# Patient Record
Sex: Male | Born: 1935
Health system: Southern US, Community
[De-identification: ages and names within clinical notes are randomized; demographics above are authoritative.]

## PROBLEM LIST (undated history)

## (undated) DIAGNOSIS — I10 Essential (primary) hypertension: Secondary | ICD-10-CM

## (undated) DIAGNOSIS — H269 Unspecified cataract: Secondary | ICD-10-CM

## (undated) DIAGNOSIS — Z950 Presence of cardiac pacemaker: Secondary | ICD-10-CM

## (undated) DIAGNOSIS — I4892 Unspecified atrial flutter: Secondary | ICD-10-CM

## (undated) DIAGNOSIS — G709 Myoneural disorder, unspecified: Secondary | ICD-10-CM

## (undated) DIAGNOSIS — E079 Disorder of thyroid, unspecified: Secondary | ICD-10-CM

## (undated) DIAGNOSIS — C801 Malignant (primary) neoplasm, unspecified: Secondary | ICD-10-CM

## (undated) HISTORY — DX: Myoneural disorder, unspecified: G70.9

## (undated) HISTORY — PX: APPENDECTOMY: SHX54

## (undated) HISTORY — PX: COLONOSCOPY: SHX174

## (undated) HISTORY — DX: Unspecified cataract: H26.9

## (undated) HISTORY — PX: PEG TUBE PLACEMENT: SUR1034

## (undated) HISTORY — PX: HERNIA REPAIR: SHX51

## (undated) HISTORY — PX: EYE SURGERY: SHX253

## (undated) HISTORY — DX: Unspecified atrial flutter: I48.92

## (undated) SURGERY — ESOPHAGOGASTRODUODENOSCOPY (EGD) WITH PROPOFOL
Anesthesia: Monitor Anesthesia Care

---

## 2008-02-13 DIAGNOSIS — Z950 Presence of cardiac pacemaker: Secondary | ICD-10-CM

## 2008-02-13 HISTORY — DX: Presence of cardiac pacemaker: Z95.0

## 2011-02-19 DIAGNOSIS — D518 Other vitamin B12 deficiency anemias: Secondary | ICD-10-CM | POA: Diagnosis not present

## 2011-02-19 DIAGNOSIS — I10 Essential (primary) hypertension: Secondary | ICD-10-CM | POA: Diagnosis not present

## 2011-02-19 DIAGNOSIS — Z0000 Encounter for general adult medical examination without abnormal findings: Secondary | ICD-10-CM | POA: Diagnosis not present

## 2011-02-19 DIAGNOSIS — Z1331 Encounter for screening for depression: Secondary | ICD-10-CM | POA: Diagnosis not present

## 2011-02-19 DIAGNOSIS — E039 Hypothyroidism, unspecified: Secondary | ICD-10-CM | POA: Diagnosis not present

## 2011-04-09 DIAGNOSIS — R259 Unspecified abnormal involuntary movements: Secondary | ICD-10-CM | POA: Diagnosis not present

## 2011-04-09 DIAGNOSIS — I10 Essential (primary) hypertension: Secondary | ICD-10-CM | POA: Diagnosis not present

## 2011-04-09 DIAGNOSIS — N183 Chronic kidney disease, stage 3 unspecified: Secondary | ICD-10-CM | POA: Diagnosis not present

## 2011-04-09 DIAGNOSIS — D649 Anemia, unspecified: Secondary | ICD-10-CM | POA: Diagnosis not present

## 2011-04-10 DIAGNOSIS — Z950 Presence of cardiac pacemaker: Secondary | ICD-10-CM | POA: Diagnosis not present

## 2011-08-27 DIAGNOSIS — E039 Hypothyroidism, unspecified: Secondary | ICD-10-CM | POA: Diagnosis not present

## 2011-08-27 DIAGNOSIS — I10 Essential (primary) hypertension: Secondary | ICD-10-CM | POA: Diagnosis not present

## 2011-09-24 DIAGNOSIS — Z950 Presence of cardiac pacemaker: Secondary | ICD-10-CM | POA: Diagnosis not present

## 2011-09-25 DIAGNOSIS — N400 Enlarged prostate without lower urinary tract symptoms: Secondary | ICD-10-CM | POA: Diagnosis not present

## 2011-09-25 DIAGNOSIS — R351 Nocturia: Secondary | ICD-10-CM | POA: Diagnosis not present

## 2011-10-09 DIAGNOSIS — R262 Difficulty in walking, not elsewhere classified: Secondary | ICD-10-CM | POA: Diagnosis not present

## 2011-10-09 DIAGNOSIS — L03039 Cellulitis of unspecified toe: Secondary | ICD-10-CM | POA: Diagnosis not present

## 2011-10-09 DIAGNOSIS — M159 Polyosteoarthritis, unspecified: Secondary | ICD-10-CM | POA: Diagnosis not present

## 2011-10-09 DIAGNOSIS — S90129A Contusion of unspecified lesser toe(s) without damage to nail, initial encounter: Secondary | ICD-10-CM | POA: Diagnosis not present

## 2011-10-09 DIAGNOSIS — M79609 Pain in unspecified limb: Secondary | ICD-10-CM | POA: Diagnosis not present

## 2011-10-09 DIAGNOSIS — I70219 Atherosclerosis of native arteries of extremities with intermittent claudication, unspecified extremity: Secondary | ICD-10-CM | POA: Diagnosis not present

## 2011-11-23 DIAGNOSIS — Z23 Encounter for immunization: Secondary | ICD-10-CM | POA: Diagnosis not present

## 2011-12-25 DIAGNOSIS — Z950 Presence of cardiac pacemaker: Secondary | ICD-10-CM | POA: Diagnosis not present

## 2011-12-28 DIAGNOSIS — J0100 Acute maxillary sinusitis, unspecified: Secondary | ICD-10-CM | POA: Diagnosis not present

## 2012-02-14 DIAGNOSIS — L568 Other specified acute skin changes due to ultraviolet radiation: Secondary | ICD-10-CM | POA: Diagnosis not present

## 2012-02-14 DIAGNOSIS — Z85828 Personal history of other malignant neoplasm of skin: Secondary | ICD-10-CM | POA: Diagnosis not present

## 2012-02-14 DIAGNOSIS — L570 Actinic keratosis: Secondary | ICD-10-CM | POA: Diagnosis not present

## 2012-02-14 DIAGNOSIS — B079 Viral wart, unspecified: Secondary | ICD-10-CM | POA: Diagnosis not present

## 2012-02-14 DIAGNOSIS — D485 Neoplasm of uncertain behavior of skin: Secondary | ICD-10-CM | POA: Diagnosis not present

## 2012-02-19 DIAGNOSIS — N183 Chronic kidney disease, stage 3 unspecified: Secondary | ICD-10-CM | POA: Diagnosis not present

## 2012-02-19 DIAGNOSIS — Z23 Encounter for immunization: Secondary | ICD-10-CM | POA: Diagnosis not present

## 2012-02-19 DIAGNOSIS — I10 Essential (primary) hypertension: Secondary | ICD-10-CM | POA: Diagnosis not present

## 2012-04-02 DIAGNOSIS — D485 Neoplasm of uncertain behavior of skin: Secondary | ICD-10-CM | POA: Diagnosis not present

## 2012-04-02 DIAGNOSIS — L570 Actinic keratosis: Secondary | ICD-10-CM | POA: Diagnosis not present

## 2012-04-02 DIAGNOSIS — C4442 Squamous cell carcinoma of skin of scalp and neck: Secondary | ICD-10-CM | POA: Diagnosis not present

## 2012-04-02 DIAGNOSIS — L905 Scar conditions and fibrosis of skin: Secondary | ICD-10-CM | POA: Diagnosis not present

## 2012-04-02 DIAGNOSIS — B079 Viral wart, unspecified: Secondary | ICD-10-CM | POA: Diagnosis not present

## 2012-04-02 DIAGNOSIS — L568 Other specified acute skin changes due to ultraviolet radiation: Secondary | ICD-10-CM | POA: Diagnosis not present

## 2012-04-07 DIAGNOSIS — Z950 Presence of cardiac pacemaker: Secondary | ICD-10-CM | POA: Diagnosis not present

## 2012-04-17 DIAGNOSIS — L570 Actinic keratosis: Secondary | ICD-10-CM | POA: Diagnosis not present

## 2012-06-25 DIAGNOSIS — R05 Cough: Secondary | ICD-10-CM | POA: Diagnosis not present

## 2012-06-25 DIAGNOSIS — R059 Cough, unspecified: Secondary | ICD-10-CM | POA: Diagnosis not present

## 2012-07-09 DIAGNOSIS — Z950 Presence of cardiac pacemaker: Secondary | ICD-10-CM | POA: Diagnosis not present

## 2012-07-17 DIAGNOSIS — L298 Other pruritus: Secondary | ICD-10-CM | POA: Diagnosis not present

## 2012-07-17 DIAGNOSIS — B079 Viral wart, unspecified: Secondary | ICD-10-CM | POA: Diagnosis not present

## 2012-07-17 DIAGNOSIS — D485 Neoplasm of uncertain behavior of skin: Secondary | ICD-10-CM | POA: Diagnosis not present

## 2012-07-17 DIAGNOSIS — L570 Actinic keratosis: Secondary | ICD-10-CM | POA: Diagnosis not present

## 2012-07-17 DIAGNOSIS — L820 Inflamed seborrheic keratosis: Secondary | ICD-10-CM | POA: Diagnosis not present

## 2012-07-17 DIAGNOSIS — L738 Other specified follicular disorders: Secondary | ICD-10-CM | POA: Diagnosis not present

## 2012-09-03 DIAGNOSIS — N183 Chronic kidney disease, stage 3 unspecified: Secondary | ICD-10-CM | POA: Diagnosis not present

## 2012-09-03 DIAGNOSIS — E039 Hypothyroidism, unspecified: Secondary | ICD-10-CM | POA: Diagnosis not present

## 2012-09-03 DIAGNOSIS — I10 Essential (primary) hypertension: Secondary | ICD-10-CM | POA: Diagnosis not present

## 2012-09-25 DIAGNOSIS — L819 Disorder of pigmentation, unspecified: Secondary | ICD-10-CM | POA: Diagnosis not present

## 2012-09-25 DIAGNOSIS — L738 Other specified follicular disorders: Secondary | ICD-10-CM | POA: Diagnosis not present

## 2012-09-25 DIAGNOSIS — L570 Actinic keratosis: Secondary | ICD-10-CM | POA: Diagnosis not present

## 2012-09-25 DIAGNOSIS — D485 Neoplasm of uncertain behavior of skin: Secondary | ICD-10-CM | POA: Diagnosis not present

## 2012-09-25 DIAGNOSIS — L820 Inflamed seborrheic keratosis: Secondary | ICD-10-CM | POA: Diagnosis not present

## 2012-09-25 DIAGNOSIS — B079 Viral wart, unspecified: Secondary | ICD-10-CM | POA: Diagnosis not present

## 2012-10-07 DIAGNOSIS — Z950 Presence of cardiac pacemaker: Secondary | ICD-10-CM | POA: Diagnosis not present

## 2012-11-12 DIAGNOSIS — E785 Hyperlipidemia, unspecified: Secondary | ICD-10-CM | POA: Insufficient documentation

## 2012-11-13 DIAGNOSIS — Z950 Presence of cardiac pacemaker: Secondary | ICD-10-CM | POA: Diagnosis not present

## 2012-12-10 DIAGNOSIS — Z23 Encounter for immunization: Secondary | ICD-10-CM | POA: Diagnosis not present

## 2013-02-02 DIAGNOSIS — Z950 Presence of cardiac pacemaker: Secondary | ICD-10-CM | POA: Diagnosis not present

## 2013-02-17 DIAGNOSIS — J988 Other specified respiratory disorders: Secondary | ICD-10-CM | POA: Diagnosis not present

## 2013-02-25 DIAGNOSIS — I10 Essential (primary) hypertension: Secondary | ICD-10-CM | POA: Diagnosis not present

## 2013-02-25 DIAGNOSIS — N183 Chronic kidney disease, stage 3 unspecified: Secondary | ICD-10-CM | POA: Diagnosis not present

## 2013-04-15 DIAGNOSIS — C44320 Squamous cell carcinoma of skin of unspecified parts of face: Secondary | ICD-10-CM | POA: Diagnosis not present

## 2013-04-15 DIAGNOSIS — C44211 Basal cell carcinoma of skin of unspecified ear and external auricular canal: Secondary | ICD-10-CM | POA: Diagnosis not present

## 2013-04-15 DIAGNOSIS — C44221 Squamous cell carcinoma of skin of unspecified ear and external auricular canal: Secondary | ICD-10-CM | POA: Diagnosis not present

## 2013-04-23 DIAGNOSIS — I369 Nonrheumatic tricuspid valve disorder, unspecified: Secondary | ICD-10-CM | POA: Diagnosis not present

## 2013-04-23 DIAGNOSIS — K219 Gastro-esophageal reflux disease without esophagitis: Secondary | ICD-10-CM | POA: Insufficient documentation

## 2013-04-23 DIAGNOSIS — I359 Nonrheumatic aortic valve disorder, unspecified: Secondary | ICD-10-CM | POA: Diagnosis not present

## 2013-04-23 DIAGNOSIS — I4892 Unspecified atrial flutter: Secondary | ICD-10-CM | POA: Diagnosis not present

## 2013-04-29 DIAGNOSIS — C44320 Squamous cell carcinoma of skin of unspecified parts of face: Secondary | ICD-10-CM | POA: Diagnosis not present

## 2013-04-29 DIAGNOSIS — L819 Disorder of pigmentation, unspecified: Secondary | ICD-10-CM | POA: Diagnosis not present

## 2013-04-29 DIAGNOSIS — L568 Other specified acute skin changes due to ultraviolet radiation: Secondary | ICD-10-CM | POA: Diagnosis not present

## 2013-05-02 ENCOUNTER — Encounter (HOSPITAL_COMMUNITY): Payer: Self-pay | Admitting: Emergency Medicine

## 2013-05-02 ENCOUNTER — Emergency Department (HOSPITAL_COMMUNITY)
Admission: EM | Admit: 2013-05-02 | Discharge: 2013-05-02 | Disposition: A | Discharge status: Home / Self Care | Payer: Medicare Other | Attending: Emergency Medicine | Admitting: Emergency Medicine

## 2013-05-02 DIAGNOSIS — E079 Disorder of thyroid, unspecified: Secondary | ICD-10-CM | POA: Insufficient documentation

## 2013-05-02 DIAGNOSIS — K5900 Constipation, unspecified: Secondary | ICD-10-CM | POA: Diagnosis not present

## 2013-05-02 DIAGNOSIS — Z79899 Other long term (current) drug therapy: Secondary | ICD-10-CM | POA: Insufficient documentation

## 2013-05-02 DIAGNOSIS — K4090 Unilateral inguinal hernia, without obstruction or gangrene, not specified as recurrent: Secondary | ICD-10-CM | POA: Insufficient documentation

## 2013-05-02 DIAGNOSIS — Z950 Presence of cardiac pacemaker: Secondary | ICD-10-CM | POA: Diagnosis not present

## 2013-05-02 DIAGNOSIS — I10 Essential (primary) hypertension: Secondary | ICD-10-CM | POA: Diagnosis not present

## 2013-05-02 DIAGNOSIS — Z880 Allergy status to penicillin: Secondary | ICD-10-CM | POA: Diagnosis not present

## 2013-05-02 HISTORY — DX: Presence of cardiac pacemaker: Z95.0

## 2013-05-02 HISTORY — DX: Essential (primary) hypertension: I10

## 2013-05-02 HISTORY — DX: Disorder of thyroid, unspecified: E07.9

## 2013-05-02 MED ORDER — DOCUSATE SODIUM 100 MG PO CAPS: 100.0000 mg | ORAL_CAPSULE | Freq: Two times a day (BID) | ORAL | Status: DC

## 2013-05-02 NOTE — Discharge Instructions (Signed)

## 2013-05-02 NOTE — ED Provider Notes (Signed)
CSN: [id number]     Arrival date & time 05/02/13  2039 History   First MD Initiated Contact with Patient 05/02/13 2307     Chief Complaint  Patient presents with  . Hernia     (Consider location/radiation/quality/duration/timing/severity/associated sxs/prior Treatment) HPI History provided by patient. Has been constipated recently, last bowel movement 2 days ago. At home tonight felt a hard lump in his left inguinal region around 7 PM. He denies any recent lifting, trauma or exertion.  It was painful but after some time he is able to push it back in.  Now in the ER denies any abdominal pain, mass or swelling. No nausea vomiting. No blood in stools. Symptoms were moderate in severity.  Patient was told in the past that he has a hernia in this area but has never noticed it before. Denies history of similar symptoms.  Past Medical History  Diagnosis Date  . Pacemaker 2010  . Thyroid disease   . Hypertension    Past Surgical History  Procedure Laterality Date  . Appendectomy    . Hernia repair     History reviewed. No pertinent family history. History  Substance Use Topics  . Smoking status: Never Smoker   . Smokeless tobacco: Not on file  . Alcohol Use: No    Review of Systems  Constitutional: Negative for fever and chills.  Respiratory: Negative for shortness of breath.   Cardiovascular: Negative for chest pain.  Gastrointestinal: Positive for constipation. Negative for nausea, vomiting, blood in stool and abdominal distention.  Genitourinary: Negative for dysuria, scrotal swelling, difficulty urinating and testicular pain.  Musculoskeletal: Negative for back pain.  Skin: Negative for rash.  Neurological: Negative for headaches.  All other systems reviewed and are negative.      Allergies  Penicillins  Home Medications   Current Outpatient Rx  Name  Route  Sig  Dispense  Refill  . BIOTIN PO   Oral   Take 1 tablet by mouth daily.         Marland Kitchen CALCIUM PO   Oral    Take 1 tablet by mouth daily.         . cholecalciferol (VITAMIN D) 1000 UNITS tablet   Oral   Take 3,000 Units by mouth daily.         Marland Kitchen co-enzyme Q-10 30 MG capsule   Oral   Take 30 mg by mouth daily.         . Ginkgo Biloba (GINKOBA PO)   Oral   Take 1 tablet by mouth daily.         Marland Kitchen levothyroxine (SYNTHROID, LEVOTHROID) 75 MCG tablet   Oral   Take 75 mcg by mouth daily before breakfast.         . MAGNESIUM PO   Oral   Take 1-2 tablets by mouth daily.         . Multiple Vitamins-Minerals (ZINC PO)   Oral   Take 1 tablet by mouth daily.         Marland Kitchen NIACIN PO   Oral   Take 1 tablet by mouth daily.         . vitamin B-12 (CYANOCOBALAMIN) 1000 MCG tablet   Oral   Take 1,000 mcg by mouth daily.         . vitamin C (ASCORBIC ACID) 500 MG tablet   Oral   Take 1,500 mg by mouth daily.         Marland Kitchen docusate sodium (COLACE) 100  MG capsule   Oral   Take 1 capsule (100 mg total) by mouth every 12 (twelve) hours.   60 capsule   0    BP 172/89  Pulse 89  Temp(Src) 98 F (36.7 C) (Oral)  Resp 20  Wt 173 lb (78.472 kg)  SpO2 95% Physical Exam  Constitutional: He is oriented to person, place, and time. He appears well-developed and well-nourished.  HENT:  Head: Normocephalic and atraumatic.  Eyes: EOM are normal. Pupils are equal, round, and reactive to light.  Neck: Neck supple.  Cardiovascular: Normal rate, regular rhythm and intact distal pulses.   Pulmonary/Chest: Effort normal and breath sounds normal. No respiratory distress.  Abdominal: Soft. Bowel sounds are normal. He exhibits no distension and no mass. There is no tenderness. There is no rebound and no guarding.  Genitourinary:  No left inguinal mass or tenderness.  No testicle or scrotal pain/swelling/ mass  Musculoskeletal: Normal range of motion. He exhibits no edema.  Neurological: He is alert and oriented to person, place, and time.  Skin: Skin is warm and dry.    ED Course   Procedures (including critical care time) Labs Review Labs Reviewed - No data to display Imaging Review No results found.  Had patient stand up, bear down and unable to reproduce hernia.  He is now pain-free. He declines any pain medications or prescription for the same.  Constipation precautions provided. Plan discharge home, outpatient followup with general surgery. Referral provided. Patient states understanding hernia instructions and precautions   MDM   Final diagnoses:  Left inguinal hernia   No abnormal pain or mass. No indication for emergent imaging at this time Vital signs and nursing notes reviewed and considered    Teressa Lower, MD 05/02/13 2329

## 2013-05-02 NOTE — ED Notes (Signed)
Pt arrived to the Ed with a complaint of abdominal pain.  Pt has a hx of a hernia on the left side.  Pt felt a knot there earlier today and rubbed it back in but not completely.  Pt last bowel movement two days ago.  Pt also has been having a lower stream pressure on urination.

## 2013-05-04 DIAGNOSIS — Z950 Presence of cardiac pacemaker: Secondary | ICD-10-CM | POA: Diagnosis not present

## 2013-05-06 DIAGNOSIS — L259 Unspecified contact dermatitis, unspecified cause: Secondary | ICD-10-CM | POA: Diagnosis not present

## 2013-05-06 DIAGNOSIS — L03039 Cellulitis of unspecified toe: Secondary | ICD-10-CM | POA: Diagnosis not present

## 2013-05-06 DIAGNOSIS — M79609 Pain in unspecified limb: Secondary | ICD-10-CM | POA: Diagnosis not present

## 2013-05-20 DIAGNOSIS — S90129A Contusion of unspecified lesser toe(s) without damage to nail, initial encounter: Secondary | ICD-10-CM | POA: Diagnosis not present

## 2013-05-20 DIAGNOSIS — L259 Unspecified contact dermatitis, unspecified cause: Secondary | ICD-10-CM | POA: Diagnosis not present

## 2013-05-20 DIAGNOSIS — M79609 Pain in unspecified limb: Secondary | ICD-10-CM | POA: Diagnosis not present

## 2013-05-27 DIAGNOSIS — L259 Unspecified contact dermatitis, unspecified cause: Secondary | ICD-10-CM | POA: Diagnosis not present

## 2013-05-27 DIAGNOSIS — M79609 Pain in unspecified limb: Secondary | ICD-10-CM | POA: Diagnosis not present

## 2013-05-27 DIAGNOSIS — S90129A Contusion of unspecified lesser toe(s) without damage to nail, initial encounter: Secondary | ICD-10-CM | POA: Diagnosis not present

## 2013-06-12 DIAGNOSIS — H6120 Impacted cerumen, unspecified ear: Secondary | ICD-10-CM | POA: Diagnosis not present

## 2013-07-31 DIAGNOSIS — H60399 Other infective otitis externa, unspecified ear: Secondary | ICD-10-CM | POA: Diagnosis not present

## 2013-08-10 DIAGNOSIS — Z950 Presence of cardiac pacemaker: Secondary | ICD-10-CM | POA: Diagnosis not present

## 2013-08-11 ENCOUNTER — Encounter (INDEPENDENT_AMBULATORY_CARE_PROVIDER_SITE_OTHER): Payer: Self-pay | Admitting: General Surgery

## 2013-08-11 ENCOUNTER — Ambulatory Visit (INDEPENDENT_AMBULATORY_CARE_PROVIDER_SITE_OTHER): Payer: Medicare Other | Admitting: General Surgery

## 2013-08-11 ENCOUNTER — Other Ambulatory Visit (INDEPENDENT_AMBULATORY_CARE_PROVIDER_SITE_OTHER): Payer: Self-pay | Admitting: General Surgery

## 2013-08-11 VITALS — BP 118/75 | HR 72 | Temp 97.5°F | Resp 14 | Ht 69.5 in | Wt 168.2 lb

## 2013-08-11 DIAGNOSIS — K4090 Unilateral inguinal hernia, without obstruction or gangrene, not specified as recurrent: Secondary | ICD-10-CM | POA: Insufficient documentation

## 2013-08-11 DIAGNOSIS — I4892 Unspecified atrial flutter: Secondary | ICD-10-CM | POA: Diagnosis not present

## 2013-08-11 DIAGNOSIS — I482 Chronic atrial fibrillation, unspecified: Secondary | ICD-10-CM | POA: Insufficient documentation

## 2013-08-11 NOTE — Patient Instructions (Signed)
We will refer you to cardiologist in [place] to be evaluated. If he or she feels it is safe for you to have the surgery, we will call you and schedule the surgery.

## 2013-08-11 NOTE — Progress Notes (Addendum)
Patient ID: Gregory [name], male   DOB: 1920-03-22, 80 y.o.   MRN: [id number]  Chief Complaint  Patient presents with  . Incisional Hernia    HPI Gregory [name] is a 80 y.o. male.   HPI  He is referred by Dr. Teressa Lower for further evaluation and treatment of a symptomatic left inguinal hernia.  Back in March, he had a painful bulge in the left groin which he was able to reduce with some effort at home. He then went to the emergency department where he was diagnosed with a hernia. It was recommended he come for surgical evaluation and he presents for that. He still has some times when he needs to push the hernia back in specifically when he is trying to have a bowel movement.  He has a history of a right inguinal hernia repair in the distant past.  Of note is that he has had some of his medical treatment at the [facility] in [place]. This included ablation for atrial flutter per his history as well as placement of a pacemaker. He does not have a cardiologist in [place].  Past Medical History  Diagnosis Date  . Pacemaker 2010  . Thyroid disease   . Hypertension   . Neuromuscular disorder     Past Surgical History  Procedure Laterality Date  . Appendectomy    . Hernia repair      Family History  Problem Relation Age of Onset  . Heart disease Mother   . Cancer Father     leukemia    Social History History  Substance Use Topics  . Smoking status: Never Smoker   . Smokeless tobacco: Not on file  . Alcohol Use: No    Allergies  Allergen Reactions  . Penicillins Hives and Rash    Current Outpatient Prescriptions  Medication Sig Dispense Refill  . BIOTIN PO Take 1 tablet by mouth daily.      Marland Kitchen CALCIUM PO Take 1 tablet by mouth daily.      . cholecalciferol (VITAMIN D) 1000 UNITS tablet Take 3,000 Units by mouth daily.      Marland Kitchen co-enzyme Q-10 30 MG capsule Take 30 mg by mouth daily.      Marland Kitchen docusate sodium (COLACE) 100 MG capsule Take 1 capsule (100 mg total)  by mouth every 12 (twelve) hours.  60 capsule  0  . Ginkgo Biloba (GINKOBA PO) Take 1 tablet by mouth daily.      Marland Kitchen levothyroxine (SYNTHROID, LEVOTHROID) 75 MCG tablet Take 75 mcg by mouth daily before breakfast.      . MAGNESIUM PO Take 1-2 tablets by mouth daily.      . Multiple Vitamins-Minerals (ZINC PO) Take 1 tablet by mouth daily.      Marland Kitchen NIACIN PO Take 1 tablet by mouth daily.      . vitamin B-12 (CYANOCOBALAMIN) 1000 MCG tablet Take 1,000 mcg by mouth daily.      . vitamin C (ASCORBIC ACID) 500 MG tablet Take 1,500 mg by mouth daily.       No current facility-administered medications for this visit.    Review of Systems Review of Systems  Constitutional: Negative.   HENT: Positive for hearing loss.   Respiratory: Negative for shortness of breath.   Cardiovascular: Negative for chest pain.  Gastrointestinal: Positive for constipation. Negative for abdominal pain.  Genitourinary: Negative for difficulty urinating.    Blood pressure 118/75, pulse 72, temperature 97.5 F (36.4 C), resp. rate 14, height 5'  9.5" (1.765 m), weight 168 lb 3.2 oz (76.295 kg).  Physical Exam Physical Exam  Constitutional: He appears well-developed and well-nourished. No distress.  HENT:  Head: Normocephalic and atraumatic.  Cardiovascular: Normal rate and regular rhythm.   Pulmonary/Chest: Effort normal and breath sounds normal.  Pacemaker in left upper chest wall.  Abdominal: Soft. He exhibits no distension. There is no tenderness.  Genitourinary:  Right groin scar. No right groin bulge. Left groin bulge that is reducible. No testicular masses.  Neurological: He is alert.  Skin: Skin is warm and dry.  Psychiatric: He has a normal mood and affect. His behavior is normal.    Data Reviewed EDP note.  Assessment    Symptomatic left inguinal hernia. He looks to be in good shape for somebody of his age. Does have a history of some cardiac disease and has not been seen by a cardiologist in  [place].     Plan    We discussed open right inguinal hernia repair with mesh with MAC and local anesthesia.  Prior to this, however, I would like for him to be seen by a cardiologist here in [facility] for a cardiac assessment.  I have explained the procedure, risks, and aftercare of inguinal hernia repair.  Risks include but are not limited to bleeding, infection, wound problems, anesthesia, recurrence, bladder or intestine injury, urinary retention, testicular dysfunction, chronic pain, mesh problems.  He seems to understand and would like to proceed. We will make a referral to cardiology.       ROSENBOWER,TODD J 08/11/2013, 5:29 PM

## 2013-08-19 ENCOUNTER — Telehealth (INDEPENDENT_AMBULATORY_CARE_PROVIDER_SITE_OTHER): Payer: Self-pay

## 2013-08-19 NOTE — Telephone Encounter (Signed)
LMOV.  Pt has appt for cardiac clearance scheduled with Dr. Johnsie Cancel at [facility] on 08/20/13 at 9:00 a.m.

## 2013-08-20 ENCOUNTER — Encounter: Payer: Self-pay | Admitting: Cardiovascular Disease

## 2013-08-20 ENCOUNTER — Ambulatory Visit: Payer: Medicare Other | Admitting: Cardiovascular Disease

## 2013-09-08 ENCOUNTER — Encounter: Payer: Self-pay | Admitting: Cardiology

## 2013-09-08 ENCOUNTER — Telehealth: Payer: Self-pay | Admitting: Cardiology

## 2013-09-08 ENCOUNTER — Ambulatory Visit (INDEPENDENT_AMBULATORY_CARE_PROVIDER_SITE_OTHER): Payer: Medicare Other | Admitting: Cardiology

## 2013-09-08 VITALS — BP 178/80 | HR 68 | Ht 69.5 in | Wt 165.4 lb

## 2013-09-08 DIAGNOSIS — I498 Other specified cardiac arrhythmias: Secondary | ICD-10-CM | POA: Diagnosis not present

## 2013-09-08 DIAGNOSIS — R011 Cardiac murmur, unspecified: Secondary | ICD-10-CM

## 2013-09-08 DIAGNOSIS — Z950 Presence of cardiac pacemaker: Secondary | ICD-10-CM | POA: Diagnosis not present

## 2013-09-08 DIAGNOSIS — Z01810 Encounter for preprocedural cardiovascular examination: Secondary | ICD-10-CM | POA: Diagnosis not present

## 2013-09-08 DIAGNOSIS — R001 Bradycardia, unspecified: Secondary | ICD-10-CM

## 2013-09-08 NOTE — Patient Instructions (Signed)
We will schedule you for an Echocardiogram   

## 2013-09-08 NOTE — Progress Notes (Signed)
Gregory [name] Date of Birth: Jan 20, 1921 Medical Record #[id number]  History of Present Illness: Gregory [name] is seen at the request of Dr. Zella Richer for pre operative cardiac evaluation for a right inguinal hernia. He is a pleasant 80 yo WM with history of atrial flutter and pacemaker. He lives in [place] [facility] but also has a house here in [place]. He has his cardiac care at the [facility] clinic in [place]. He reports that he had significant bradycardia in 2002 ans had a pacemaker placed. This was revised in 2010. He had pacemaker check in June and it was functioning fine. He has a history of atrial flutter and had a successful ablation in the past. He denies any history of CAD, MI, or CHF. He denies any chest pain, dyspnea, or palpitations. No dizziness. He thinks he had a stress test 8-10 years ago. Does not recall an Echocardiogram but states he has been told he has a murmur. He is being considered for a right inguinal hernia repair.    Medication List       This list is accurate as of: 09/08/13  1:18 PM.  Always use your most recent med list.               BIOTIN PO  Take 1 tablet by mouth as needed.     CALCIUM PO  Take 1 tablet by mouth as needed.     cholecalciferol 1000 UNITS tablet  Commonly known as:  VITAMIN D  Take 3,000 Units by mouth as needed.     co-enzyme Q-10 30 MG capsule  Take 30 mg by mouth daily.     docusate sodium 100 MG capsule  Commonly known as:  COLACE  Take 100 mg by mouth as needed.     GINKOBA PO  Take 1 tablet by mouth as needed.     levothyroxine 75 MCG tablet  Commonly known as:  SYNTHROID, LEVOTHROID  Take 75 mcg by mouth daily before breakfast.     MAGNESIUM PO  Take 1-2 tablets by mouth daily.     NIACIN PO  Take 1 tablet by mouth as needed.     vitamin B-12 1000 MCG tablet  Commonly known as:  CYANOCOBALAMIN  Take 1,000 mcg by mouth daily.     vitamin C 500 MG tablet  Commonly known as:  ASCORBIC ACID  Take 1,500 mg  by mouth daily.     ZINC PO  Take 1 tablet by mouth as needed.         Allergies  Allergen Reactions  . Penicillins Hives and Rash    Past Medical History  Diagnosis Date  . Pacemaker 2010  . Thyroid disease   . Hypertension   . Neuromuscular disorder   . Atrial flutter     s/p ablation    Past Surgical History  Procedure Laterality Date  . Appendectomy    . Hernia repair      History   Social History  . Marital Status: Married    Spouse Name: N/A    Number of Children: 2  . Years of Education: N/A   Occupational History  . retired Actor    Social History Main Topics  . Smoking status: Never Smoker   . Smokeless tobacco: None  . Alcohol Use: No  . Drug Use: No  . Sexual Activity: No   Other Topics Concern  . None   Social History Narrative  . None    Family History  Problem Relation Age of Onset  . Heart disease Mother   . Cancer Father     leukemia    Review of Systems: As noted in HPI. He states he doesn't like to take medication but takes a long list of vitamins and herbal supplements. All other systems were reviewed and are negative.  Physical Exam: BP 178/80  Pulse 68  Ht 5' 9.5" (1.765 m)  Wt 165 lb 6.4 oz (75.025 kg)  BMI 24.08 kg/m2 Filed Weights   09/08/13 1025  Weight: 165 lb 6.4 oz (75.025 kg)  GENERAL:  Well appearing, elderly WM- appears younger than stated age. HEENT:  PERRL, EOMI, sclera are clear. Oropharynx is clear. NECK:  No jugular venous distention, carotid upstroke brisk and symmetric, no bruits, no thyromegaly or adenopathy LUNGS:  Clear to auscultation bilaterally CHEST:  Unremarkable HEART:  RRR,  PMI not displaced or sustained,S1 and S2 within normal limits, there is a harsh grade 6-9/7 systolic murmur at the apex radiating to the RUSB ABD:  Soft, nontender. BS +, no masses or bruits. No hepatomegaly, no splenomegaly EXT:  2 + pulses throughout, no edema, no cyanosis no clubbing SKIN:  Warm and dry.   No rashes NEURO:  Alert and oriented x 3. Cranial nerves II through XII intact. PSYCH:  Cognitively intact    LABORATORY DATA: Ecg: AV pacing.   Assessment / Plan: 1. History of bradycardia s/p permanent pacemaker 2. History of atrial flutter s/p ablation. 3. Right inguinal hernia. 4. Elevated BP. Reading on recent surgery evaluation was normal. Patient reports he was placed on medication in the past but then he didn't need it. 5. Heart murmur. Differential includes MR versus aortic stenosis. No clear evaluation in past.  Plan: will obtain an Echocardiogram. He will likely be cleared for surgery since this a low risk procedure but would like to see the Echo first.

## 2013-09-11 ENCOUNTER — Ambulatory Visit (HOSPITAL_COMMUNITY)
Admission: RE | Admit: 2013-09-11 | Discharge: 2013-09-11 | Disposition: A | Discharge status: Home / Self Care | Payer: Medicare Other | Source: Ambulatory Visit | Attending: Cardiovascular Disease | Admitting: Cardiovascular Disease

## 2013-09-11 DIAGNOSIS — I359 Nonrheumatic aortic valve disorder, unspecified: Secondary | ICD-10-CM

## 2013-09-11 DIAGNOSIS — Z950 Presence of cardiac pacemaker: Secondary | ICD-10-CM | POA: Insufficient documentation

## 2013-09-11 DIAGNOSIS — Z01810 Encounter for preprocedural cardiovascular examination: Secondary | ICD-10-CM | POA: Diagnosis not present

## 2013-09-11 DIAGNOSIS — R001 Bradycardia, unspecified: Secondary | ICD-10-CM

## 2013-09-11 DIAGNOSIS — I498 Other specified cardiac arrhythmias: Secondary | ICD-10-CM | POA: Insufficient documentation

## 2013-09-11 DIAGNOSIS — R011 Cardiac murmur, unspecified: Secondary | ICD-10-CM

## 2013-09-11 NOTE — Progress Notes (Signed)
2D Echocardiogram Complete.  09/11/2013   Tyteanna Ost, RDCS  

## 2013-09-14 ENCOUNTER — Ambulatory Visit (HOSPITAL_COMMUNITY): Payer: Medicare Other

## 2013-09-14 ENCOUNTER — Telehealth (INDEPENDENT_AMBULATORY_CARE_PROVIDER_SITE_OTHER): Payer: Self-pay

## 2013-09-14 DIAGNOSIS — I10 Essential (primary) hypertension: Secondary | ICD-10-CM | POA: Diagnosis not present

## 2013-09-14 DIAGNOSIS — E039 Hypothyroidism, unspecified: Secondary | ICD-10-CM | POA: Diagnosis not present

## 2013-09-14 DIAGNOSIS — N183 Chronic kidney disease, stage 3 unspecified: Secondary | ICD-10-CM | POA: Diagnosis not present

## 2013-09-14 NOTE — Telephone Encounter (Signed)
[name] from Dr Doug Sou office called stating pts clearance is in epic attached to his echo cardiogram test. I advised her I will send this msg to [facility] Dr Bertrum Sol assistant.

## 2013-09-14 NOTE — Telephone Encounter (Signed)
Dr.Rosenbower's office called Dr.Jordan cleared patient for upcoming inguinal hernia surgery.

## 2013-09-15 ENCOUNTER — Encounter (INDEPENDENT_AMBULATORY_CARE_PROVIDER_SITE_OTHER): Payer: Self-pay | Admitting: General Surgery

## 2013-09-15 NOTE — Addendum Note (Signed)
Addended by: Odis Hollingshead on: 09/15/2013 11:38 AM   Modules accepted: Orders

## 2013-09-15 NOTE — Progress Notes (Unsigned)
Patient ID: Gregory [name], male   DOB: 01-07-1921, 80 y.o.   MRN: [id number] He saw Dr. Martinique who cleared him for surgery from a cardiac standpoint.  Will work on getting his right inguinal hernia repair scheduled.

## 2013-09-22 ENCOUNTER — Other Ambulatory Visit (INDEPENDENT_AMBULATORY_CARE_PROVIDER_SITE_OTHER): Payer: Self-pay | Admitting: General Surgery

## 2013-09-24 ENCOUNTER — Other Ambulatory Visit (INDEPENDENT_AMBULATORY_CARE_PROVIDER_SITE_OTHER): Payer: Self-pay | Admitting: General Surgery

## 2013-10-02 ENCOUNTER — Encounter (HOSPITAL_COMMUNITY): Payer: Self-pay

## 2013-10-02 ENCOUNTER — Encounter (HOSPITAL_COMMUNITY)
Admission: RE | Admit: 2013-10-02 | Discharge: 2013-10-02 | Disposition: A | Discharge status: Home / Self Care | Payer: Medicare Other | Source: Ambulatory Visit | Attending: General Surgery | Admitting: General Surgery

## 2013-10-02 ENCOUNTER — Other Ambulatory Visit (HOSPITAL_COMMUNITY): Payer: Self-pay | Admitting: *Deleted

## 2013-10-02 ENCOUNTER — Encounter (HOSPITAL_COMMUNITY)
Admission: RE | Admit: 2013-10-02 | Discharge: 2013-10-02 | Disposition: A | Discharge status: Home / Self Care | Payer: Medicare Other | Source: Ambulatory Visit | Attending: Anesthesiology | Admitting: Anesthesiology

## 2013-10-02 DIAGNOSIS — Z950 Presence of cardiac pacemaker: Secondary | ICD-10-CM | POA: Diagnosis not present

## 2013-10-02 DIAGNOSIS — K4090 Unilateral inguinal hernia, without obstruction or gangrene, not specified as recurrent: Secondary | ICD-10-CM | POA: Diagnosis not present

## 2013-10-02 DIAGNOSIS — Z01818 Encounter for other preprocedural examination: Secondary | ICD-10-CM | POA: Insufficient documentation

## 2013-10-02 HISTORY — DX: Malignant (primary) neoplasm, unspecified: C80.1

## 2013-10-02 LAB — COMPREHENSIVE METABOLIC PANEL
ALBUMIN: 4.1 g/dL (ref 3.5–5.2)
ALT: 14 U/L (ref 0–53)
ANION GAP: 15 (ref 5–15)
AST: 23 U/L (ref 0–37)
Alkaline Phosphatase: 51 U/L (ref 39–117)
BUN: 24 mg/dL — AB (ref 6–23)
CO2: 22 mEq/L (ref 19–32)
Calcium: 9.3 mg/dL (ref 8.4–10.5)
Chloride: 101 mEq/L (ref 96–112)
Creatinine, Ser: 1.11 mg/dL (ref 0.50–1.35)
GFR calc non Af Amer: 56 mL/min — ABNORMAL LOW (ref >90)
GFR, EST AFRICAN AMERICAN: 65 mL/min — AB (ref >90)
Glucose, Bld: 95 mg/dL (ref 70–99)
Potassium: 4.7 mEq/L (ref 3.7–5.3)
SODIUM: 138 meq/L (ref 137–147)
TOTAL PROTEIN: 7.6 g/dL (ref 6.0–8.3)
Total Bilirubin: 0.4 mg/dL (ref 0.3–1.2)

## 2013-10-02 LAB — CBC WITH DIFFERENTIAL/PLATELET
BASOS ABS: 0.1 10*3/uL (ref 0.0–0.1)
Basophils Relative: 1 % (ref 0–1)
EOS ABS: 0 10*3/uL (ref 0.0–0.7)
Eosinophils Relative: 0 % (ref 0–5)
HCT: 32.4 % — ABNORMAL LOW (ref 39.0–52.0)
HEMOGLOBIN: 11.2 g/dL — AB (ref 13.0–17.0)
LYMPHS PCT: 27 % (ref 12–46)
Lymphs Abs: 2 10*3/uL (ref 0.7–4.0)
MCH: 32 pg (ref 26.0–34.0)
MCHC: 34.6 g/dL (ref 30.0–36.0)
MCV: 92.6 fL (ref 78.0–100.0)
Monocytes Absolute: 0.9 10*3/uL (ref 0.1–1.0)
Monocytes Relative: 12 % (ref 3–12)
NEUTROS ABS: 4.4 10*3/uL (ref 1.7–7.7)
NEUTROS PCT: 60 % (ref 43–77)
Platelets: 162 10*3/uL (ref 150–400)
RBC: 3.5 MIL/uL — ABNORMAL LOW (ref 4.22–5.81)
RDW: 14 % (ref 11.5–15.5)
WBC: 7.4 10*3/uL (ref 4.0–10.5)

## 2013-10-02 LAB — PROTIME-INR
INR: 1.04 (ref 0.00–1.49)
Prothrombin Time: 13.6 seconds (ref 11.6–15.2)

## 2013-10-02 NOTE — Pre-Procedure Instructions (Signed)
Gregory [name]  10/02/2013   Your procedure is scheduled on:  Monday, October 12, 2013 at 9:30 AM.   Report to [facility] Entrance "A" Admitting Office at 7:30 AM.   Call this number if you have problems the morning of surgery: [phone]   Remember:   Do not eat food or drink liquids after midnight Sunday, 10/11/13.   Take these medicines the morning of surgery with A SIP OF WATER: levothyroxine (SYNTHROID, LEVOTHROID)  Stop all Vitamins and Herbal Medications as of Monday, 09/04/13.    Do not wear jewelry.  Do not wear lotions, powders, or cologne. You may wear deodorant.  Men may shave face and neck.  Do not bring valuables to the hospital.  [facility] is not responsible                  for any belongings or valuables.               Contacts, dentures or bridgework may not be worn into surgery.  Leave suitcase in the car. After surgery it may be brought to your room.  For patients admitted to the hospital, discharge time is determined by your                treatment team.               Special Instructions: [place] - Preparing for Surgery  Before surgery, you can play an important role.  Because skin is not sterile, your skin needs to be as free of germs as possible.  You can reduce the number of germs on you skin by washing with CHG (chlorahexidine gluconate) soap before surgery.  CHG is an antiseptic cleaner which kills germs and bonds with the skin to continue killing germs even after washing.  Please DO NOT use if you have an allergy to CHG or antibacterial soaps.  If your skin becomes reddened/irritated stop using the CHG and inform your nurse when you arrive at Short Stay.  Do not shave (including legs and underarms) for at least 48 hours prior to the first CHG shower.  You may shave your face.  Please follow these instructions carefully:   1.  Shower with CHG Soap the night before surgery and the                                morning of Surgery.  2.  If  you choose to wash your hair, wash your hair first as usual with your       normal shampoo.  3.  After you shampoo, rinse your hair and body thoroughly to remove the                      Shampoo.  4.  Use CHG as you would any other liquid soap.  You can apply chg directly       to the skin and wash gently with scrungie or a clean washcloth.  5.  Apply the CHG Soap to your body ONLY FROM THE NECK DOWN.        Do not use on open wounds or open sores.  Avoid contact with your eyes, ears, mouth and genitals (private parts).  Wash genitals (private parts) with your normal soap.  6.  Wash thoroughly, paying special attention to the area where your surgery        will be  performed.  7.  Thoroughly rinse your body with warm water from the neck down.  8.  DO NOT shower/wash with your normal soap after using and rinsing off       the CHG Soap.  9.  Pat yourself dry with a clean towel.            10.  Wear clean pajamas.            11.  Place clean sheets on your bed the night of your first shower and do not        sleep with pets.  Day of Surgery  Do not apply any lotions the morning of surgery.  Please wear clean clothes to the hospital/surgery center.     Please read over the following fact sheets that you were given: Pain Booklet, Coughing and Deep Breathing and Surgical Site Infection Prevention

## 2013-10-02 NOTE — Progress Notes (Addendum)
Consent order states "Right" inguinal hernia repair, pt states it's supposed to be "left". I called Dr. Bertrum Sol office to get correct order. Office is closed for training this afternoon. Will f/u on Monday.  Pt has a pacemaker and he does his pacemaker checks via the phone and once a year at the [facility] in [place], [place]. He told me I could call Anderson Malta and the Pacemaker clinic and she could help me. I called and spoke with Anderson Malta and explained that pt is having surgery and we needed pacemaker instructions for day of surgery. Anderson Malta gave me her fax number and stated that they will be glad fill out the paperwork. Faxed paperwork to [place].

## 2013-10-05 ENCOUNTER — Telehealth (INDEPENDENT_AMBULATORY_CARE_PROVIDER_SITE_OTHER): Payer: Self-pay | Admitting: *Deleted

## 2013-10-05 ENCOUNTER — Other Ambulatory Visit (INDEPENDENT_AMBULATORY_CARE_PROVIDER_SITE_OTHER): Payer: Self-pay | Admitting: General Surgery

## 2013-10-05 ENCOUNTER — Other Ambulatory Visit (INDEPENDENT_AMBULATORY_CARE_PROVIDER_SITE_OTHER): Payer: Self-pay | Admitting: *Deleted

## 2013-10-05 NOTE — Telephone Encounter (Signed)
Order left

## 2013-10-05 NOTE — Progress Notes (Addendum)
Received Perioperative Prescription for Implanted Cardiac Device back from Pacemaker Clinic at the [facility] in [place], [place]. Paged the [employer] Rep. Awaiting return call.  2:50 PM Tomi Bamberger, [employer] rep returned call. States that since the magnet will be used, no rep needs to be present.

## 2013-10-05 NOTE — Telephone Encounter (Signed)
[name] from Short Stay at [facility] states that the pt's surgery is actually on his left side and the consent states the left.  She asked that Dr. Zella Richer go in and fix the orders so they can get pt to sign the right consent.  Gregory [name]

## 2013-10-10 ENCOUNTER — Ambulatory Visit (INDEPENDENT_AMBULATORY_CARE_PROVIDER_SITE_OTHER): Payer: Medicare Other | Admitting: Family Medicine

## 2013-10-10 VITALS — BP 128/62 | HR 62 | Temp 98.0°F | Resp 20 | Ht 69.5 in | Wt 161.1 lb

## 2013-10-10 DIAGNOSIS — J329 Chronic sinusitis, unspecified: Secondary | ICD-10-CM | POA: Diagnosis not present

## 2013-10-10 MED ORDER — AZITHROMYCIN 250 MG PO TABS: ORAL_TABLET | ORAL | Status: DC

## 2013-10-10 NOTE — Progress Notes (Signed)
Subjective:    Patient ID: Gregory [name], male    DOB: 12/27/1920, 80 y.o.   MRN: [id number]  Cough Associated symptoms include postnasal drip. Pertinent negatives include no chills, fever or sore throat.   Chief Complaint  Patient presents with   Cough   Nasal Drainage   This chart was scribed for Gregory [name] , MD by Thea Alken, ED Scribe. This patient was seen in room 2 and the patient's care was started at 12:14 PM.  HPI Comments: Gregory [name] is a 80 y.o. male who presents to the [facility] complaining of cough and rhinorrhea onset 1 day ago with associated postnasal drip. Pt reports recent trip to the beach but upon returning he began having cold symptoms. Pt is hoarse but denies sore throat. He denies fever and chills. Pt denies allergies. Pt is scheduled for hernia surgery by Dr. Zella Richer  in 2 days.   Past Medical History  Diagnosis Date   Pacemaker 2010   Thyroid disease    Hypertension    Neuromuscular disorder    Atrial flutter     s/p ablation   Cancer     skin cancer   Cataracts, bilateral    Past Surgical History  Procedure Laterality Date   Appendectomy     Eye surgery Bilateral     cataract surgery   Hernia repair Right    Colonoscopy     Prior to Admission medications   Medication Sig Start Date End Date Taking? Authorizing Provider  BIOTIN PO Take 1 tablet by mouth as needed.    Yes Historical Provider, MD  CALCIUM PO Take 1 tablet by mouth as needed.    Yes Historical Provider, MD  cholecalciferol (VITAMIN D) 1000 UNITS tablet Take 3,000 Units by mouth as needed.    Yes Historical Provider, MD  co-enzyme Q-10 30 MG capsule Take 30 mg by mouth daily as needed.    Yes Historical Provider, MD  docusate sodium (COLACE) 100 MG capsule Take 100 mg by mouth as needed for mild constipation.  05/02/13  Yes Teressa Lower, MD  Ginkgo Biloba (GINKOBA PO) Take 1 tablet by mouth as needed.    Yes Historical Provider, MD    levothyroxine (SYNTHROID, LEVOTHROID) 75 MCG tablet Take 75 mcg by mouth daily before breakfast.   Yes Historical Provider, MD  MAGNESIUM PO Take 1-2 tablets by mouth daily as needed.    Yes Historical Provider, MD  NIACIN PO Take 1 tablet by mouth as needed.    Yes Historical Provider, MD  vitamin B-12 (CYANOCOBALAMIN) 1000 MCG tablet Take 1,000 mcg by mouth daily as needed.    Yes Historical Provider, MD  vitamin C (ASCORBIC ACID) 500 MG tablet Take 1,500 mg by mouth daily as needed.    Yes Historical Provider, MD   Review of Systems  Constitutional: Negative for fever and chills.  HENT: Positive for postnasal drip. Negative for sore throat.   Respiratory: Positive for cough.    Objective:   Physical Exam  Nursing note and vitals reviewed. Constitutional: He is oriented to person, place, and time. He appears well-developed and well-nourished. No distress.  HENT:  Head: Normocephalic and atraumatic.  Nose: Mucosal edema and rhinorrhea present.  Mouth/Throat: Posterior oropharyngeal erythema ( mild) present.  Bilateral hearing aids and decreased hearing   Eyes: Conjunctivae and EOM are normal.  Neck: Neck supple.  Cardiovascular: Normal rate, regular rhythm and normal heart sounds.  Exam reveals no gallop and no  friction rub.   No murmur heard. Pulmonary/Chest: Effort normal and breath sounds normal. No respiratory distress. He has no wheezes. He has no rales. He exhibits no tenderness.  Musculoskeletal: Normal range of motion.  Lymphadenopathy:    He has no cervical adenopathy.  Neurological: He is alert and oriented to person, place, and time.  Skin: Skin is warm and dry.  Psychiatric: He has a normal mood and affect. His behavior is normal.    Assessment & Plan:   1. Unspecified sinusitis (chronic)    Meds ordered this encounter  Medications   azithromycin (ZITHROMAX Z-PAK) 250 MG tablet    Sig: Take as directed on pack    Dispense:  6 tablet    Refill:  0   Gregory Mannan, MD

## 2013-10-10 NOTE — Patient Instructions (Signed)

## 2013-10-11 MED ORDER — VANCOMYCIN HCL IN DEXTROSE 1-5 GM/200ML-% IV SOLN
1000.0000 mg | INTRAVENOUS | Status: AC
  Administered 2013-10-12: 1000 mg via INTRAVENOUS
  Filled 2013-10-11: qty 200

## 2013-10-12 ENCOUNTER — Ambulatory Visit (HOSPITAL_COMMUNITY): Payer: Medicare Other | Admitting: Anesthesiology

## 2013-10-12 ENCOUNTER — Encounter (HOSPITAL_COMMUNITY)
Admission: RE | Disposition: A | Discharge status: Home / Self Care | Payer: Self-pay | Source: Ambulatory Visit | Attending: General Surgery

## 2013-10-12 ENCOUNTER — Encounter (HOSPITAL_COMMUNITY): Payer: Self-pay | Admitting: *Deleted

## 2013-10-12 ENCOUNTER — Ambulatory Visit (HOSPITAL_COMMUNITY)
Admission: RE | Admit: 2013-10-12 | Discharge: 2013-10-13 | Disposition: A | Discharge status: Home / Self Care | Payer: Medicare Other | Source: Ambulatory Visit | Attending: General Surgery | Admitting: General Surgery

## 2013-10-12 ENCOUNTER — Encounter (HOSPITAL_COMMUNITY): Payer: Medicare Other | Admitting: Anesthesiology

## 2013-10-12 DIAGNOSIS — Z79899 Other long term (current) drug therapy: Secondary | ICD-10-CM | POA: Diagnosis not present

## 2013-10-12 DIAGNOSIS — Z9089 Acquired absence of other organs: Secondary | ICD-10-CM | POA: Insufficient documentation

## 2013-10-12 DIAGNOSIS — Z880 Allergy status to penicillin: Secondary | ICD-10-CM | POA: Insufficient documentation

## 2013-10-12 DIAGNOSIS — I4892 Unspecified atrial flutter: Secondary | ICD-10-CM | POA: Insufficient documentation

## 2013-10-12 DIAGNOSIS — I10 Essential (primary) hypertension: Secondary | ICD-10-CM | POA: Insufficient documentation

## 2013-10-12 DIAGNOSIS — Z85828 Personal history of other malignant neoplasm of skin: Secondary | ICD-10-CM | POA: Diagnosis not present

## 2013-10-12 DIAGNOSIS — Z950 Presence of cardiac pacemaker: Secondary | ICD-10-CM | POA: Insufficient documentation

## 2013-10-12 DIAGNOSIS — K4090 Unilateral inguinal hernia, without obstruction or gangrene, not specified as recurrent: Secondary | ICD-10-CM | POA: Diagnosis not present

## 2013-10-12 HISTORY — PX: INGUINAL HERNIA REPAIR: SHX194

## 2013-10-12 HISTORY — PX: INSERTION OF MESH: SHX5868

## 2013-10-12 SURGERY — REPAIR, HERNIA, INGUINAL, ADULT
Anesthesia: Monitor Anesthesia Care | Site: Groin | Laterality: Left

## 2013-10-12 MED ORDER — HYDROCODONE-ACETAMINOPHEN 5-325 MG PO TABS: 1.0000 | ORAL_TABLET | ORAL | Status: DC | PRN

## 2013-10-12 MED ORDER — HEPARIN SODIUM (PORCINE) 5000 UNIT/ML IJ SOLN
5000.0000 [IU] | Freq: Three times a day (TID) | INTRAMUSCULAR | Status: DC
  Administered 2013-10-13: 5000 [IU] via SUBCUTANEOUS
  Filled 2013-10-12 (×4): qty 1

## 2013-10-12 MED ORDER — FENTANYL CITRATE 0.05 MG/ML IJ SOLN
INTRAMUSCULAR | Status: AC
  Filled 2013-10-12: qty 5

## 2013-10-12 MED ORDER — ONDANSETRON HCL 4 MG/2ML IJ SOLN
INTRAMUSCULAR | Status: AC
  Filled 2013-10-12: qty 2

## 2013-10-12 MED ORDER — PROPOFOL 10 MG/ML IV BOLUS
INTRAVENOUS | Status: DC | PRN
  Administered 2013-10-12 (×5): 10 mg via INTRAVENOUS

## 2013-10-12 MED ORDER — MIDAZOLAM HCL 2 MG/2ML IJ SOLN
INTRAMUSCULAR | Status: AC
  Filled 2013-10-12: qty 2

## 2013-10-12 MED ORDER — LACTATED RINGERS IV SOLN
INTRAVENOUS | Status: DC | PRN
Start: 2013-10-12 — End: 2013-10-12
  Administered 2013-10-12: 09:00:00 via INTRAVENOUS

## 2013-10-12 MED ORDER — ONDANSETRON HCL 4 MG/2ML IJ SOLN
4.0000 mg | Freq: Four times a day (QID) | INTRAMUSCULAR | Status: DC | PRN
Start: 2013-10-12 — End: 2013-10-13

## 2013-10-12 MED ORDER — 0.9 % SODIUM CHLORIDE (POUR BTL) OPTIME
TOPICAL | Status: DC | PRN
  Administered 2013-10-12: 1000 mL

## 2013-10-12 MED ORDER — BUPIVACAINE-EPINEPHRINE (PF) 0.5% -1:200000 IJ SOLN
INTRAMUSCULAR | Status: AC
Start: 2013-10-12 — End: 2013-10-12
  Filled 2013-10-12: qty 30

## 2013-10-12 MED ORDER — FENTANYL CITRATE 0.05 MG/ML IJ SOLN: 50.0000 ug | INTRAMUSCULAR | Status: DC | PRN

## 2013-10-12 MED ORDER — ONDANSETRON HCL 4 MG/2ML IJ SOLN: 4.0000 mg | Freq: Once | INTRAMUSCULAR | Status: DC | PRN

## 2013-10-12 MED ORDER — DOCUSATE SODIUM 100 MG PO CAPS: 100.0000 mg | ORAL_CAPSULE | ORAL | Status: DC | PRN

## 2013-10-12 MED ORDER — ONDANSETRON HCL 4 MG PO TABS: 4.0000 mg | ORAL_TABLET | Freq: Four times a day (QID) | ORAL | Status: DC | PRN

## 2013-10-12 MED ORDER — FENTANYL CITRATE 0.05 MG/ML IJ SOLN
INTRAMUSCULAR | Status: AC
  Administered 2013-10-12: 25 ug via INTRAVENOUS
  Administered 2013-10-12: 50 ug via INTRAVENOUS
  Administered 2013-10-12 (×2): 25 ug via INTRAVENOUS
  Administered 2013-10-12: 50 ug via INTRAVENOUS
  Administered 2013-10-12: 25 ug via INTRAVENOUS
  Filled 2013-10-12: qty 2

## 2013-10-12 MED ORDER — LACTATED RINGERS IV SOLN
INTRAVENOUS | Status: DC
  Administered 2013-10-12: 09:00:00 via INTRAVENOUS

## 2013-10-12 MED ORDER — PROPOFOL 10 MG/ML IV BOLUS
INTRAVENOUS | Status: AC
  Filled 2013-10-12: qty 20

## 2013-10-12 MED ORDER — MIDAZOLAM HCL 2 MG/2ML IJ SOLN: 1.0000 mg | INTRAMUSCULAR | Status: DC | PRN

## 2013-10-12 MED ORDER — ONDANSETRON HCL 4 MG/2ML IJ SOLN
INTRAMUSCULAR | Status: DC | PRN
  Administered 2013-10-12: 4 mg via INTRAVENOUS

## 2013-10-12 MED ORDER — FENTANYL CITRATE 0.05 MG/ML IJ SOLN: 25.0000 ug | INTRAMUSCULAR | Status: DC | PRN

## 2013-10-12 MED ORDER — BUPIVACAINE-EPINEPHRINE (PF) 0.5% -1:200000 IJ SOLN
INTRAMUSCULAR | Status: DC | PRN
  Administered 2013-10-12: 30 mL via PERINEURAL

## 2013-10-12 MED ORDER — BUPIVACAINE-EPINEPHRINE 0.5% -1:200000 IJ SOLN
INTRAMUSCULAR | Status: DC | PRN
  Administered 2013-10-12: 26 mL

## 2013-10-12 MED ORDER — LEVOTHYROXINE SODIUM 75 MCG PO TABS
75.0000 ug | ORAL_TABLET | Freq: Every day | ORAL | Status: DC
  Administered 2013-10-13: 75 ug via ORAL
  Filled 2013-10-12 (×2): qty 1

## 2013-10-12 SURGICAL SUPPLY — 48 items
BENZOIN TINCTURE PRP APPL 2/3 (GAUZE/BANDAGES/DRESSINGS) ×2 IMPLANT
BLADE SURG 10 STRL SS (BLADE) ×2 IMPLANT
BLADE SURG 15 STRL LF DISP TIS (BLADE) ×1 IMPLANT
BLADE SURG 15 STRL SS (BLADE) ×1
BLADE SURG ROTATE 9660 (MISCELLANEOUS) ×2 IMPLANT
CHLORAPREP W/TINT 26ML (MISCELLANEOUS) ×2 IMPLANT
COVER SURGICAL LIGHT HANDLE (MISCELLANEOUS) ×2 IMPLANT
DRAIN PENROSE 1/2X12 LTX STRL (WOUND CARE) ×2 IMPLANT
DRAPE INCISE IOBAN 66X45 STRL (DRAPES) ×2 IMPLANT
DRAPE LAPAROTOMY TRNSV 102X78 (DRAPE) ×2 IMPLANT
DRAPE UTILITY 15X26 W/TAPE STR (DRAPE) ×4 IMPLANT
DRSG OPSITE 6X11 MED (GAUZE/BANDAGES/DRESSINGS) IMPLANT
DRSG TEGADERM 4X4.75 (GAUZE/BANDAGES/DRESSINGS) ×2 IMPLANT
DRSG TELFA 3X8 NADH (GAUZE/BANDAGES/DRESSINGS) ×2 IMPLANT
ELECT CAUTERY BLADE 6.4 (BLADE) ×2 IMPLANT
ELECT REM PT RETURN 9FT ADLT (ELECTROSURGICAL) ×2
ELECTRODE REM PT RTRN 9FT ADLT (ELECTROSURGICAL) ×1 IMPLANT
GLOVE BIO SURGEON STRL SZ7 (GLOVE) ×2 IMPLANT
GLOVE BIOGEL PI IND STRL 7.0 (GLOVE) ×2 IMPLANT
GLOVE BIOGEL PI IND STRL 8 (GLOVE) ×1 IMPLANT
GLOVE BIOGEL PI INDICATOR 7.0 (GLOVE) ×2
GLOVE BIOGEL PI INDICATOR 8 (GLOVE) ×1
GLOVE ECLIPSE 8.0 STRL XLNG CF (GLOVE) ×4 IMPLANT
GOWN STRL REUS W/ TWL LRG LVL3 (GOWN DISPOSABLE) ×3 IMPLANT
GOWN STRL REUS W/TWL LRG LVL3 (GOWN DISPOSABLE) ×3
KIT BASIN OR (CUSTOM PROCEDURE TRAY) ×2 IMPLANT
KIT ROOM TURNOVER OR (KITS) ×2 IMPLANT
MESH HERNIA 3X6 (Mesh General) ×2 IMPLANT
NEEDLE HYPO 25GX1X1/2 BEV (NEEDLE) ×2 IMPLANT
NS IRRIG 1000ML POUR BTL (IV SOLUTION) ×2 IMPLANT
PACK SURGICAL SETUP 50X90 (CUSTOM PROCEDURE TRAY) ×2 IMPLANT
PAD ARMBOARD 7.5X6 YLW CONV (MISCELLANEOUS) ×2 IMPLANT
PENCIL BUTTON HOLSTER BLD 10FT (ELECTRODE) ×2 IMPLANT
PENCIL FOOT CONTROL (ELECTRODE) IMPLANT
SLEEVE SURGEON STRL (DRAPES) ×2 IMPLANT
SPECIMEN JAR SMALL (MISCELLANEOUS) IMPLANT
SPONGE LAP 18X18 X RAY DECT (DISPOSABLE) ×2 IMPLANT
STRIP CLOSURE SKIN 1/2X4 (GAUZE/BANDAGES/DRESSINGS) ×2 IMPLANT
SUT MON AB 4-0 PC3 18 (SUTURE) ×2 IMPLANT
SUT PROLENE 2 0 CT2 30 (SUTURE) ×4 IMPLANT
SUT SILK 2 0 SH (SUTURE) IMPLANT
SUT VIC AB 2-0 SH 18 (SUTURE) ×2 IMPLANT
SUT VIC AB 3-0 SH 27 (SUTURE) ×1
SUT VIC AB 3-0 SH 27XBRD (SUTURE) ×1 IMPLANT
SUT VICRYL AB 3 0 TIES (SUTURE) ×2 IMPLANT
SYR CONTROL 10ML LL (SYRINGE) ×2 IMPLANT
TOWEL OR 17X24 6PK STRL BLUE (TOWEL DISPOSABLE) IMPLANT
TOWEL OR 17X26 10 PK STRL BLUE (TOWEL DISPOSABLE) ×2 IMPLANT

## 2013-10-12 NOTE — Anesthesia Preprocedure Evaluation (Addendum)
Anesthesia Evaluation  Patient identified by MRN, date of birth, ID band Patient awake    Reviewed: Allergy & Precautions, H&P , NPO status , Patient's Chart, lab work & pertinent test results  Airway Mallampati: II  Neck ROM: full    Dental   Pulmonary  Recent URI.  Still on antibiotics.  Pt states he is feeling better. breath sounds clear to auscultation        Cardiovascular hypertension, + dysrhythmias Atrial Fibrillation + pacemaker Rhythm:Regular Rate:Normal     Neuro/Psych  Neuromuscular disease    GI/Hepatic   Endo/Other    Renal/GU      Musculoskeletal   Abdominal   Peds  Hematology   Anesthesia Other Findings   Reproductive/Obstetrics                          Anesthesia Physical Anesthesia Plan  ASA: III  Anesthesia Plan: MAC and Regional   Post-op Pain Management:    Induction: Intravenous  Airway Management Planned: Simple Face Mask  Additional Equipment:   Intra-op Plan:   Post-operative Plan:   Informed Consent: I have reviewed the patients History and Physical, chart, labs and discussed the procedure including the risks, benefits and alternatives for the proposed anesthesia with the patient or authorized representative who has indicated his/her understanding and acceptance.     Plan Discussed with: CRNA, Anesthesiologist and Surgeon  Anesthesia Plan Comments:         Anesthesia Quick Evaluation

## 2013-10-12 NOTE — Progress Notes (Signed)
Patient arrived from PACU via stretcher to room [phone].  Pt transferred from stretcher to bed without any difficulty.  Pt has no c/o pain or nausea at this time.  Drsg CDI and ice pack in place.  VSS.  Oriented pt to call bell, room, dept 6 [place].  Family at bedside.    Report given to Kathrine Cords, RN to take over care

## 2013-10-12 NOTE — Anesthesia Procedure Notes (Signed)
Anesthesia Regional Block:  TAP block  Pre-Anesthetic Checklist: ,, timeout performed, Correct Patient, Correct Site, Correct Laterality, Correct Procedure, Correct Position, site marked, Risks and benefits discussed,  Surgical consent,  Pre-op evaluation,  At surgeon's request and post-op pain management  Laterality: Left  Prep: chloraprep       Needles:  Injection technique: Single-shot  Needle Type: Echogenic Needle     Needle Length: 9cm 9 cm Needle Gauge: 21 and 21 G    Additional Needles:  Procedures: ultrasound guided (picture in chart) TAP block Narrative:  Start time: 10/12/2013 9:22 AM End time: 10/12/2013 9:32 AM Injection made incrementally with aspirations every 5 mL.  Performed by: Personally  Anesthesiologist: Dr Marcie Bal  Additional Notes: Pt tolerated the procedure well.

## 2013-10-12 NOTE — Transfer of Care (Signed)
Immediate Anesthesia Transfer of Care Note  Patient: Gregory [name]  Procedure(s) Performed: Procedure(s): LEFT  INGUINAL HERNIA REPAIR (Left) INSERTION OF MESH (Left)  Patient Location: PACU  Anesthesia Type:MAC and Regional  Level of Consciousness: awake, alert , oriented and sedated  Airway & Oxygen Therapy: Patient Spontanous Breathing and Patient connected to nasal cannula oxygen  Post-op Assessment: Report given to PACU RN and Patient moving all extremities  Post vital signs: Reviewed and stable  Complications: No apparent anesthesia complications

## 2013-10-12 NOTE — H&P (Signed)
Gregory [name] is an 80 y.o. male.   Chief Complaint:   Here for elective surgery. HPI:  Back in March, he had a painful bulge in the left groin which he was able to reduce with some effort at home. He then went to the emergency department where he was diagnosed with a hernia. It was recommended he come for surgical evaluation and he presents for that. He still has some times when he needs to push the hernia back in specifically when he is trying to have a bowel movement. He has a history of a right inguinal hernia repair in the distant past.  He has been seen by the Cardiologist and is an acceptable risk to proceed with the operation.   Past Medical History  Diagnosis Date  . Pacemaker 2010  . Thyroid disease   . Hypertension   . Neuromuscular disorder   . Atrial flutter     s/p ablation  . Cancer     skin cancer  . Cataracts, bilateral     Past Surgical History  Procedure Laterality Date  . Appendectomy    . Eye surgery Bilateral     cataract surgery  . Hernia repair Right   . Colonoscopy      Family History  Problem Relation Age of Onset  . Heart disease Mother   . Cancer Father     leukemia   Social History:  reports that he has never smoked. He has never used smokeless tobacco. He reports that he does not drink alcohol or use illicit drugs.  Allergies:  Allergies  Allergen Reactions  . Penicillins Hives and Rash    Medications Prior to Admission  Medication Sig Dispense Refill  . azithromycin (ZITHROMAX Z-PAK) 250 MG tablet Take as directed on pack  6 tablet  0  . BIOTIN PO Take 1 tablet by mouth as needed.       Marland Kitchen CALCIUM PO Take 1 tablet by mouth as needed.       . cholecalciferol (VITAMIN D) 1000 UNITS tablet Take 3,000 Units by mouth as needed.       Marland Kitchen co-enzyme Q-10 30 MG capsule Take 30 mg by mouth daily as needed.       . docusate sodium (COLACE) 100 MG capsule Take 100 mg by mouth as needed for mild constipation.       . Ginkgo Biloba (GINKOBA PO) Take 1  tablet by mouth as needed.       Marland Kitchen levothyroxine (SYNTHROID, LEVOTHROID) 75 MCG tablet Take 75 mcg by mouth daily before breakfast.      . MAGNESIUM PO Take 1-2 tablets by mouth daily as needed.       Marland Kitchen NIACIN PO Take 1 tablet by mouth as needed.       . vitamin B-12 (CYANOCOBALAMIN) 1000 MCG tablet Take 1,000 mcg by mouth daily as needed.       . vitamin C (ASCORBIC ACID) 500 MG tablet Take 1,500 mg by mouth daily as needed.         No results found for this or any previous visit (from the past 48 hour(s)). No results found.  Review of Systems  Constitutional: Negative for fever and chills.  Gastrointestinal: Negative for nausea, vomiting and diarrhea.    Blood pressure 145/55, pulse 59, temperature 97.6 F (36.4 C), temperature source Oral, resp. rate 18, weight 161 lb (73.029 kg), SpO2 100.00%. Physical Exam Constitutional: He appears well-developed and well-nourished. No distress.  HENT:  Head: Normocephalic  and atraumatic.  Cardiovascular: Normal rate and regular rhythm.  Pulmonary/Chest: Effort normal and breath sounds normal.  Pacemaker in left upper chest wall.  Abdominal: Soft. He exhibits no distension. There is no tenderness.  Genitourinary:  Right groin scar. No right groin bulge. Left groin bulge that is reducible. No testicular masses.  Neurological: He is alert.  Skin: Skin is warm and dry.      Assessment/Plan Symptomatic left inguinal hernia  Plan:  Left inguinal hernia repair with mesh.  OWER afterwards.  [name] 10/12/2013, 9:24 AM

## 2013-10-12 NOTE — Op Note (Signed)
Preoperative diagnosis:  Left inguinal hernia.  Postoperative diagnosis:  Same (direct)  Procedure:  Left inguinal hernia repair with mesh.  Surgeon:  Jackolyn Confer, M.D.  Anesthesia:  MAC with local (Marcaine) and TAPP block   Indication:  This is a 80 year old male, very active, with a symptomatic left inguinal hernia.  He now presents for repair.  Technique:  He was seen in the holding room and the left groin was marked with my initials. A TAPP block was performed by the anesthesiologist. He was brought to the operating, placed supine on the operating table, and intravenous sedaton was administered by the anesthesiologist. The hair in the groin area was clipped as was felt to be necessary. This area was then sterilely prepped and draped.  Local anesthetic was infiltrated in the superficial and deep tissues in the left groin.  An incision was made through the skin and subcutaneous tissue until the external oblique aponeurosis was identified.  Local anesthetic was infiltrated deep to the external oblique aponeurosis. The external oblique aponeurosis was divided through the external ring medially and back toward the anterior superior iliac spine laterally. Using blunt dissection, the shelving edge of the inguinal ligament was identified inferiorly and the internal oblique aponeurosis and muscle were identified superiorly. The ilioinguinal nerve was identified and preserved.  The spermatic cord was isolated and a posterior window was made around it. A direct hernia sac was identified and separated from the spermatic cord using blunt dissection. The hernia sac and its contents were reduced through the direct hernia defect.   A piece of 3" x 6" polypropylene mesh was brought into the field and anchored 1-2 cm medial to the pubic tubercle with 2-0 Prolene suture. The inferior aspect of the mesh was anchored to the shelving edge of the inguinal ligament with running 2-0 Prolene suture to a level 1-2  cm lateral to the internal ring. A slit was cut in the mesh creating 2 tails. These were wrapped around the spermatic cord. The superior aspect of the mesh was anchored to the internal oblique aponeurosis and muscle with interrupted 2-0 Vicryl sutures. The 2 tails of the mesh were then crossed creating a new internal ring and were anchored to the shelving edge of the inguinal ligament with 2-0 Prolene suture. The tip of a hemostat could be placed through the new aperture. The lateral aspect of the mesh was then tucked deep to the external oblique aponeurosis.  The wound was inspected and hemostasis was adequate. The external oblique aponeurosis was then closed over the mesh and cord with running 3-0 Vicryl suture. The subcutaneous tissue was closed with running 3-0 Vicryl suture. The skin closed with a running 4-0 Monocryl subcuticular stitch.  Steri-Strips and a sterile dressing were applied.  The procedure was well-tolerated without any apparent complications and he was taken to the recovery room in satisfactory condition.

## 2013-10-12 NOTE — Discharge Instructions (Signed)
[facility] _______[facility]  INGUINAL HERNIA REPAIR: POST OP INSTRUCTIONS  Always review your discharge instruction sheet given to you by the facility where your surgery was performed. IF YOU HAVE DISABILITY OR FAMILY LEAVE FORMS, YOU MUST BRING THEM TO THE OFFICE FOR PROCESSING.   DO NOT GIVE THEM TO YOUR DOCTOR.  1. A  prescription for pain medication may be given to you upon discharge.  Take your pain medication as prescribed, if needed.  If narcotic pain medicine is not needed, then you may take acetaminophen (Tylenol) or ibuprofen (Advil) as needed. 2. Take your usually prescribed medications unless otherwise directed. 3. If you need a refill on your pain medication, please contact your pharmacy.  They will contact our office to request authorization. Prescriptions will not be filled after 5 pm or on week-ends. 4. You should follow a light diet the first 24 hours after arrival home, such as soup and crackers, etc.  Be sure to include lots of fluids daily.  Resume your normal diet the day after surgery. 5. Most patients will experience some swelling and bruising around the umbilicus or in the groin and scrotum.  Ice packs and reclining will help.  Swelling and bruising can take several days to resolve.  6. It is common to experience some constipation if taking pain medication after surgery.  Increasing fluid intake and taking a stool softener (such as Colace) will usually help or prevent this problem from occurring.  A mild laxative (Milk of Magnesia or Miralax) should be taken according to package directions if there are no bowel movements after 48 hours. 7. Unless discharge instructions indicate otherwise, you may remove your bandages 72 hours after surgery, and you may shower at that time.  You may have steri-strips (small skin tapes) in place directly over the incision.  These strips should be left on the skin until they fall off by themselves.  If your surgeon used skin glue on the  incision, you may shower in 24 hours.  The glue will flake off over the next 2-3 weeks.  Any sutures or staples will be removed at the office during your follow-up visit. 8. ACTIVITIES:  You may resume regular (light) daily activities beginning the next day--such as daily self-care, walking, climbing stairs--gradually increasing activities as tolerated.  You may have sexual intercourse when it is comfortable.  Refrain from any heavy lifting or straining (nothing over 10 pounds) until approved by your doctor. a. You may drive when you are no longer taking prescription pain medication, you can comfortably wear a seatbelt, and you can safely maneuver your car and apply brakes. b. RETURN TO WORK:  __________________________________________________________ 9. You should see your doctor in the office for a follow-up appointment approximately 2-3 weeks after your surgery.  Make sure that you call for this appointment within a day or two after you arrive home to insure a convenient appointment time. 10. OTHER INSTRUCTIONS:  __________________________________________________________________________________________________________________________________________________________________________________________  WHEN TO CALL YOUR DOCTOR: 1. Fever over 101.0 2. Inability to urinate 3. Nausea and/or vomiting 4. Extreme swelling or bruising 5. Continued bleeding from incision. 6. Increased pain, redness, or drainage from the incision  The clinic staff is available to answer your questions during regular business hours.  Please dont hesitate to call and ask to speak to one of the nurses for clinical concerns.  If you have a medical emergency, go to the nearest emergency room or call 911.  A surgeon from [facility] is always on call  at the hospital   [address], [place], [place], [place]  [address] ?  [address], [place], [place]   [phone] ? [phone] ? FAX [phone]  [phone] Web site: [email or web address]

## 2013-10-13 ENCOUNTER — Encounter (HOSPITAL_COMMUNITY): Payer: Self-pay | Admitting: General Surgery

## 2013-10-13 DIAGNOSIS — K4090 Unilateral inguinal hernia, without obstruction or gangrene, not specified as recurrent: Secondary | ICD-10-CM | POA: Diagnosis not present

## 2013-10-13 NOTE — Discharge Summary (Signed)
Physician Discharge Summary  Patient ID: Gregory [name] MRN: [id number] DOB/AGE: [birth year] 80 y.o.  Admit date: 10/12/2013 Discharge date: 10/13/2013  Admission Diagnoses:  Left inguinal hernia [facility])  Discharge Diagnoses:  Active Problems:   Inguinal hernia without mention of obstruction or gangrene, unilateral or unspecified, (not specified as recurrent)-left   Discharged Condition: good  Hospital Course: He underwent LIH repair with mesh 10/12/13.  He did well overnight and was able to be discharged on POD#1.  Discharge instructions were given to him and his wife.  Consults: None  Significant Diagnostic Studies: none  Treatments: surgery: LIH repair with mesh  Discharge Exam: Blood pressure 118/64, pulse 64, temperature 98.9 F (37.2 C), temperature source Oral, resp. rate 16, height 5' 9.5" (1.765 m), weight 161 lb (73.029 kg), SpO2 97.00%.   Disposition: 01-Home or Self Care     Medication List         azithromycin 250 MG tablet  Commonly known as:  ZITHROMAX Z-PAK  Take as directed on pack     BIOTIN PO  Take 1 tablet by mouth as needed.     CALCIUM PO  Take 1 tablet by mouth as needed.     cholecalciferol 1000 UNITS tablet  Commonly known as:  VITAMIN D  Take 3,000 Units by mouth as needed.     co-enzyme Q-10 30 MG capsule  Take 30 mg by mouth daily as needed.     docusate sodium 100 MG capsule  Commonly known as:  COLACE  Take 100 mg by mouth as needed for mild constipation.     GINKOBA PO  Take 1 tablet by mouth as needed.     HYDROcodone-acetaminophen 5-325 MG per tablet  Commonly known as:  NORCO/VICODIN  Take 1-2 tablets by mouth every 4 (four) hours as needed for moderate pain or severe pain.     levothyroxine 75 MCG tablet  Commonly known as:  SYNTHROID, LEVOTHROID  Take 75 mcg by mouth daily before breakfast.     MAGNESIUM PO  Take 1-2 tablets by mouth daily as needed.     NIACIN PO  Take 1 tablet by mouth as needed.     vitamin  B-12 1000 MCG tablet  Commonly known as:  CYANOCOBALAMIN  Take 1,000 mcg by mouth daily as needed.     vitamin C 500 MG tablet  Commonly known as:  ASCORBIC ACID  Take 1,500 mg by mouth daily as needed.         Signed: Odis Hollingshead 10/13/2013, 11:41 AM

## 2013-10-13 NOTE — Progress Notes (Signed)
[name] to be D/C'd Home per MD order.  Discussed with the patient and all questions fully answered.  VSS, Skin clean, dry and intact without evidence of skin break down, no evidence of skin tears noted. IV catheter discontinued intact. Site without signs and symptoms of complications. Dressing and pressure applied.  An After Visit Summary was printed and given to the patient. Prescription given to patient.  Patient also given ice packs and encouraged to use them at home.  D/c education completed with patient/family including follow up instructions, medication list, d/c activities limitations if indicated, with other d/c instructions as indicated by MD - patient able to verbalize understanding, all questions fully answered.   Patient instructed to return to ED, call 911, or call MD for any changes in condition.   Patient escorted via [place], and D/C home via private auto.  Micki Riley 10/13/2013 12:38 PM

## 2013-10-13 NOTE — Progress Notes (Signed)
1 Day Post-Op  Subjective: Not having any pain.  Voiding okay.  Objective: Vital signs in last 24 hours: Temp:  [98 F (36.7 C)-99.2 F (37.3 C)] 98.9 F (37.2 C) (09/01 0952) Pulse Rate:  [60-65] 64 (09/01 0952) Resp:  [16-18] 16 (09/01 0952) BP: (110-131)/(49-64) 118/64 mmHg (09/01 0952) SpO2:  [93 %-100 %] 97 % (09/01 0952) Weight:  [161 lb (73.029 kg)] 161 lb (73.029 kg) (08/31 1208) Last BM Date: 10/10/13  Intake/Output from previous day: 08/31 0701 - 09/01 0700 In: 1970 [P.O.:1170; I.V.:800] Out: 750 [Urine:750] Intake/Output this shift: Total I/O In: 240 [P.O.:240] Out: -   PE: General- In NAD GU-left groin bandage is dry with minimal swelling.  Lab Results:  No results found for this basename: WBC, HGB, HCT, PLT,  in the last 72 hours BMET No results found for this basename: NA, K, CL, CO2, GLUCOSE, BUN, CREATININE, CALCIUM,  in the last 72 hours PT/INR No results found for this basename: LABPROT, INR,  in the last 72 hours Comprehensive Metabolic Panel:    Component Value Date/Time   NA 138 10/02/2013 1347   K 4.7 10/02/2013 1347   CL 101 10/02/2013 1347   CO2 22 10/02/2013 1347   BUN 24* 10/02/2013 1347   CREATININE 1.11 10/02/2013 1347   GLUCOSE 95 10/02/2013 1347   CALCIUM 9.3 10/02/2013 1347   AST 23 10/02/2013 1347   ALT 14 10/02/2013 1347   ALKPHOS 51 10/02/2013 1347   BILITOT 0.4 10/02/2013 1347   PROT 7.6 10/02/2013 1347   ALBUMIN 4.1 10/02/2013 1347     Studies/Results: No results found.  Anti-infectives: Anti-infectives   Start     Dose/Rate Route Frequency Ordered Stop   10/12/13 0600  vancomycin (VANCOCIN) IVPB 1000 mg/200 mL premix     1,000 mg 200 mL/hr over 60 Minutes Intravenous On call to O.R. 10/11/13 1410 10/12/13 0951      Assessment Active Problems:    LIH s/p repair with mesh 10/12/13-doing well.   LOS: 1 day   Plan: Discharge.  Instructions given to him and his wife.   Riven Mabile J 10/13/2013

## 2013-10-13 NOTE — Progress Notes (Signed)
UR Completed.  Vergie Living [phone] 10/13/2013

## 2013-10-20 NOTE — Anesthesia Postprocedure Evaluation (Signed)
Anesthesia Post Note  Patient: Gregory [name]  Procedure(s) Performed: Procedure(s) (LRB): LEFT  INGUINAL HERNIA REPAIR (Left) INSERTION OF MESH (Left)  Anesthesia type: MAC and TAP block  Patient location: PACU  Post pain: Pain level controlled and Adequate analgesia  Post assessment: Post-op Vital signs reviewed, Patient's Cardiovascular Status Stable and Respiratory Function Stable  Last Vitals:  Filed Vitals:   10/13/13 0952  BP: 118/64  Pulse: 64  Temp: 37.2 C  Resp: 16    Post vital signs: Reviewed and stable  Level of consciousness: awake, alert  and oriented  Complications: No apparent anesthesia complications

## 2013-10-22 DIAGNOSIS — I359 Nonrheumatic aortic valve disorder, unspecified: Secondary | ICD-10-CM | POA: Diagnosis not present

## 2013-10-26 ENCOUNTER — Encounter (INDEPENDENT_AMBULATORY_CARE_PROVIDER_SITE_OTHER): Payer: Medicare Other | Admitting: General Surgery

## 2013-11-10 DIAGNOSIS — Z950 Presence of cardiac pacemaker: Secondary | ICD-10-CM | POA: Diagnosis not present

## 2014-03-05 DIAGNOSIS — Z950 Presence of cardiac pacemaker: Secondary | ICD-10-CM | POA: Diagnosis not present

## 2014-03-22 DIAGNOSIS — Z23 Encounter for immunization: Secondary | ICD-10-CM | POA: Diagnosis not present

## 2014-03-22 DIAGNOSIS — N183 Chronic kidney disease, stage 3 (moderate): Secondary | ICD-10-CM | POA: Diagnosis not present

## 2014-03-22 DIAGNOSIS — E039 Hypothyroidism, unspecified: Secondary | ICD-10-CM | POA: Diagnosis not present

## 2014-03-22 DIAGNOSIS — I10 Essential (primary) hypertension: Secondary | ICD-10-CM | POA: Diagnosis not present

## 2014-04-14 DIAGNOSIS — D485 Neoplasm of uncertain behavior of skin: Secondary | ICD-10-CM | POA: Diagnosis not present

## 2014-04-14 DIAGNOSIS — L570 Actinic keratosis: Secondary | ICD-10-CM | POA: Diagnosis not present

## 2014-04-14 DIAGNOSIS — L814 Other melanin hyperpigmentation: Secondary | ICD-10-CM | POA: Diagnosis not present

## 2014-04-14 DIAGNOSIS — B079 Viral wart, unspecified: Secondary | ICD-10-CM | POA: Diagnosis not present

## 2014-04-14 DIAGNOSIS — L578 Other skin changes due to chronic exposure to nonionizing radiation: Secondary | ICD-10-CM | POA: Diagnosis not present

## 2014-05-25 DIAGNOSIS — Z008 Encounter for other general examination: Secondary | ICD-10-CM | POA: Diagnosis not present

## 2014-06-21 DIAGNOSIS — Z950 Presence of cardiac pacemaker: Secondary | ICD-10-CM | POA: Diagnosis not present

## 2014-07-27 DIAGNOSIS — C44329 Squamous cell carcinoma of skin of other parts of face: Secondary | ICD-10-CM | POA: Diagnosis not present

## 2014-08-05 DIAGNOSIS — C44329 Squamous cell carcinoma of skin of other parts of face: Secondary | ICD-10-CM | POA: Diagnosis not present

## 2014-08-12 DIAGNOSIS — C44329 Squamous cell carcinoma of skin of other parts of face: Secondary | ICD-10-CM | POA: Diagnosis not present

## 2014-08-17 DIAGNOSIS — C44329 Squamous cell carcinoma of skin of other parts of face: Secondary | ICD-10-CM | POA: Diagnosis not present

## 2014-08-19 DIAGNOSIS — C4492 Squamous cell carcinoma of skin, unspecified: Secondary | ICD-10-CM | POA: Diagnosis not present

## 2014-08-24 DIAGNOSIS — C44329 Squamous cell carcinoma of skin of other parts of face: Secondary | ICD-10-CM | POA: Diagnosis not present

## 2014-08-26 DIAGNOSIS — C44329 Squamous cell carcinoma of skin of other parts of face: Secondary | ICD-10-CM | POA: Diagnosis not present

## 2014-08-31 DIAGNOSIS — L570 Actinic keratosis: Secondary | ICD-10-CM | POA: Diagnosis not present

## 2014-08-31 DIAGNOSIS — C44329 Squamous cell carcinoma of skin of other parts of face: Secondary | ICD-10-CM | POA: Diagnosis not present

## 2014-08-31 DIAGNOSIS — L820 Inflamed seborrheic keratosis: Secondary | ICD-10-CM | POA: Diagnosis not present

## 2014-09-02 DIAGNOSIS — C44329 Squamous cell carcinoma of skin of other parts of face: Secondary | ICD-10-CM | POA: Diagnosis not present

## 2014-09-07 DIAGNOSIS — C44329 Squamous cell carcinoma of skin of other parts of face: Secondary | ICD-10-CM | POA: Diagnosis not present

## 2014-09-07 DIAGNOSIS — C4492 Squamous cell carcinoma of skin, unspecified: Secondary | ICD-10-CM | POA: Diagnosis not present

## 2014-09-09 DIAGNOSIS — C44329 Squamous cell carcinoma of skin of other parts of face: Secondary | ICD-10-CM | POA: Diagnosis not present

## 2014-09-09 DIAGNOSIS — C4492 Squamous cell carcinoma of skin, unspecified: Secondary | ICD-10-CM | POA: Diagnosis not present

## 2014-09-14 DIAGNOSIS — C44329 Squamous cell carcinoma of skin of other parts of face: Secondary | ICD-10-CM | POA: Diagnosis not present

## 2014-09-16 DIAGNOSIS — C44329 Squamous cell carcinoma of skin of other parts of face: Secondary | ICD-10-CM | POA: Diagnosis not present

## 2014-09-20 DIAGNOSIS — I4892 Unspecified atrial flutter: Secondary | ICD-10-CM | POA: Diagnosis not present

## 2014-09-20 DIAGNOSIS — D539 Nutritional anemia, unspecified: Secondary | ICD-10-CM | POA: Diagnosis not present

## 2014-09-20 DIAGNOSIS — N183 Chronic kidney disease, stage 3 (moderate): Secondary | ICD-10-CM | POA: Diagnosis not present

## 2014-09-20 DIAGNOSIS — I10 Essential (primary) hypertension: Secondary | ICD-10-CM | POA: Diagnosis not present

## 2014-09-20 DIAGNOSIS — E039 Hypothyroidism, unspecified: Secondary | ICD-10-CM | POA: Diagnosis not present

## 2014-09-21 DIAGNOSIS — C44329 Squamous cell carcinoma of skin of other parts of face: Secondary | ICD-10-CM | POA: Diagnosis not present

## 2014-09-27 DIAGNOSIS — Z950 Presence of cardiac pacemaker: Secondary | ICD-10-CM | POA: Diagnosis not present

## 2014-10-15 ENCOUNTER — Ambulatory Visit (INDEPENDENT_AMBULATORY_CARE_PROVIDER_SITE_OTHER): Payer: Medicare Other | Admitting: Cardiology

## 2014-10-15 ENCOUNTER — Encounter: Payer: Self-pay | Admitting: Cardiology

## 2014-10-15 VITALS — BP 140/76 | HR 64 | Ht 69.5 in | Wt 168.0 lb

## 2014-10-15 DIAGNOSIS — R001 Bradycardia, unspecified: Secondary | ICD-10-CM

## 2014-10-15 DIAGNOSIS — Z950 Presence of cardiac pacemaker: Secondary | ICD-10-CM | POA: Diagnosis not present

## 2014-10-15 DIAGNOSIS — Z952 Presence of prosthetic heart valve: Secondary | ICD-10-CM | POA: Insufficient documentation

## 2014-10-15 DIAGNOSIS — I350 Nonrheumatic aortic (valve) stenosis: Secondary | ICD-10-CM

## 2014-10-15 DIAGNOSIS — I4892 Unspecified atrial flutter: Secondary | ICD-10-CM

## 2014-10-15 NOTE — Progress Notes (Signed)
Gregory [name] Date of Birth: 01-29-1921 Medical Record #[id number]  History of Present Illness: Gregory [name] is seen for cardiac follow up. He is a pleasant 81 yo WM with history of atrial flutter and pacemaker. He lives in [facility] but also has a house here in [place]. He has his cardiac care at the [facility] clinic in [place]. He reports that he had significant bradycardia in 2002 ans had a pacemaker placed. This was revised in 2010.  He has a history of atrial flutter and had a successful ablation in the past. He denies any history of CAD, MI, or CHF. He denies any chest pain, dyspnea, or palpitations. No dizziness. He underwent inguinal hernia surgery last year. He states this went well except he completely lost his appetite. It has returned now and he has put weight back on.     Medication List       This list is accurate as of: 10/15/14  9:46 AM.  Always use your most recent med list.               BIOTIN PO  Take 1 tablet by mouth as needed.     CALCIUM PO  Take 1 tablet by mouth as needed.     cholecalciferol 1000 UNITS tablet  Commonly known as:  VITAMIN D  Take 3,000 Units by mouth as needed.     co-enzyme Q-10 30 MG capsule  Take 30 mg by mouth daily as needed.     docusate sodium 100 MG capsule  Commonly known as:  COLACE  Take 100 mg by mouth as needed for mild constipation.     GINKOBA PO  Take 1 tablet by mouth as needed.     levothyroxine 75 MCG tablet  Commonly known as:  SYNTHROID, LEVOTHROID  Take 75 mcg by mouth daily before breakfast.     MAGNESIUM PO  Take 1-2 tablets by mouth daily as needed.     NIACIN PO  Take 1 tablet by mouth as needed.     vitamin B-12 1000 MCG tablet  Commonly known as:  CYANOCOBALAMIN  Take 1,000 mcg by mouth daily as needed.     vitamin C 500 MG tablet  Commonly known as:  ASCORBIC ACID  Take 1,500 mg by mouth daily as needed.         Allergies  Allergen Reactions  . Penicillins Hives and Rash      Past Medical History  Diagnosis Date  . Pacemaker 2010  . Thyroid disease   . Hypertension   . Neuromuscular disorder   . Atrial flutter     s/p ablation  . Cancer     skin cancer  . Cataracts, bilateral     Past Surgical History  Procedure Laterality Date  . Appendectomy    . Eye surgery Bilateral     cataract surgery  . Hernia repair Right   . Colonoscopy    . Inguinal hernia repair Left 10/12/2013    Procedure: LEFT  INGUINAL HERNIA REPAIR;  Surgeon: Odis Hollingshead, MD;  Location: [facility];  Service: General;  Laterality: Left;  . Insertion of mesh Left 10/12/2013    Procedure: INSERTION OF MESH;  Surgeon: Odis Hollingshead, MD;  Location: [place];  Service: General;  Laterality: Left;    Social History   Social History  . Marital Status: Married    Spouse Name: N/A  . Number of Children: 2  . Years of Education: N/A  Occupational History  . retired Actor    Social History Main Topics  . Smoking status: Never Smoker   . Smokeless tobacco: Never Used  . Alcohol Use: No  . Drug Use: No  . Sexual Activity: No   Other Topics Concern  . None   Social History Narrative    Family History  Problem Relation Age of Onset  . Heart disease Mother   . Cancer Father     leukemia    Review of Systems: As noted in HPI.  All other systems were reviewed and are negative.  Physical Exam: BP 140/76 mmHg  Pulse 64  Ht 5' 9.5" (1.765 m)  Wt 76.204 kg (168 lb)  BMI 24.46 kg/m2 Filed Weights   10/15/14 0845  Weight: 76.204 kg (168 lb)  GENERAL:  Well appearing, elderly WM- appears younger than stated age. HEENT:  PERRL, EOMI, sclera are clear. Oropharynx is clear. NECK:  No jugular venous distention, carotid upstroke brisk and symmetric, no bruits, no thyromegaly or adenopathy LUNGS:  Clear to auscultation bilaterally CHEST:  Unremarkable HEART:  RRR,  PMI not displaced or sustained,S1 and S2 within normal limits, there is a harsh grade 7-6/8 systolic  murmur at the apex radiating to the RUSB ABD:  Soft, nontender. BS +, no masses or bruits. No hepatomegaly, no splenomegaly EXT:  2 + pulses throughout, no edema, no cyanosis no clubbing SKIN:  Warm and dry.  No rashes NEURO:  Alert and oriented x 3. Cranial nerves II through XII intact. PSYCH:  Cognitively intact    LABORATORY DATA: Ecg: todayt- AV pacing. Rate 64. I have personally reviewed and interpreted this study.  Echo: 09/11/13:Study Conclusions  - Left ventricle: The cavity size was normal. There was mild focal basal hypertrophy of the septum. Systolic function was normal. Wall motion was normal; there were no regional wall motion abnormalities. There was a reduced contribution of atrial contraction to ventricular filling, due to increased ventricular diastolic pressure or atrial contractile dysfunction. Doppler parameters are consistent with a reversible restrictive pattern, indicative of decreased left ventricular diastolic compliance and/or increased left atrial pressure (grade 3 diastolic dysfunction). - Aortic valve: Severe diffuse thickening and calcification. Valve mobility was restricted. There was mild stenosis. Valve area (VTI): 1.31 cm^2. Valve area (Vmax): 1.43 cm^2. - Pulmonary arteries: PA peak pressure: 38 mm Hg (S).  Impressions:  - The right ventricular systolic pressure was increased consistent with mild pulmonary hypertension.   Assessment / Plan: 1. History of bradycardia s/p permanent pacemaker 2. History of atrial flutter s/p ablation. 3. S/p right inguinal hernia repair.  4. Mild aortic stenosis.  Plan: continue current therapy and follow up in one year.

## 2014-10-15 NOTE — Patient Instructions (Signed)
Continue your current therapy  I will see you in one year   

## 2014-11-29 DIAGNOSIS — Z23 Encounter for immunization: Secondary | ICD-10-CM | POA: Diagnosis not present

## 2014-12-07 DIAGNOSIS — S39012A Strain of muscle, fascia and tendon of lower back, initial encounter: Secondary | ICD-10-CM | POA: Diagnosis not present

## 2014-12-07 DIAGNOSIS — E079 Disorder of thyroid, unspecified: Secondary | ICD-10-CM | POA: Diagnosis not present

## 2014-12-07 DIAGNOSIS — Z880 Allergy status to penicillin: Secondary | ICD-10-CM | POA: Diagnosis not present

## 2014-12-07 DIAGNOSIS — S0093XA Contusion of unspecified part of head, initial encounter: Secondary | ICD-10-CM | POA: Diagnosis not present

## 2014-12-27 DIAGNOSIS — Z950 Presence of cardiac pacemaker: Secondary | ICD-10-CM | POA: Diagnosis not present

## 2015-01-12 DIAGNOSIS — H9041 Sensorineural hearing loss, unilateral, right ear, with unrestricted hearing on the contralateral side: Secondary | ICD-10-CM | POA: Diagnosis not present

## 2015-02-17 DIAGNOSIS — M5136 Other intervertebral disc degeneration, lumbar region: Secondary | ICD-10-CM | POA: Diagnosis not present

## 2015-02-17 DIAGNOSIS — M47817 Spondylosis without myelopathy or radiculopathy, lumbosacral region: Secondary | ICD-10-CM | POA: Diagnosis not present

## 2015-02-17 DIAGNOSIS — M25562 Pain in left knee: Secondary | ICD-10-CM | POA: Diagnosis not present

## 2015-02-17 DIAGNOSIS — G8929 Other chronic pain: Secondary | ICD-10-CM | POA: Diagnosis not present

## 2015-02-17 DIAGNOSIS — M545 Low back pain: Secondary | ICD-10-CM | POA: Diagnosis not present

## 2015-03-17 DIAGNOSIS — M25562 Pain in left knee: Secondary | ICD-10-CM | POA: Diagnosis not present

## 2015-04-04 DIAGNOSIS — Z950 Presence of cardiac pacemaker: Secondary | ICD-10-CM | POA: Diagnosis not present

## 2015-04-19 DIAGNOSIS — I10 Essential (primary) hypertension: Secondary | ICD-10-CM | POA: Diagnosis not present

## 2015-04-19 DIAGNOSIS — Z0000 Encounter for general adult medical examination without abnormal findings: Secondary | ICD-10-CM | POA: Diagnosis not present

## 2015-04-19 DIAGNOSIS — I4892 Unspecified atrial flutter: Secondary | ICD-10-CM | POA: Diagnosis not present

## 2015-04-19 DIAGNOSIS — D649 Anemia, unspecified: Secondary | ICD-10-CM | POA: Diagnosis not present

## 2015-04-19 DIAGNOSIS — Z1389 Encounter for screening for other disorder: Secondary | ICD-10-CM | POA: Diagnosis not present

## 2015-04-19 DIAGNOSIS — N183 Chronic kidney disease, stage 3 (moderate): Secondary | ICD-10-CM | POA: Diagnosis not present

## 2015-04-19 DIAGNOSIS — E039 Hypothyroidism, unspecified: Secondary | ICD-10-CM | POA: Diagnosis not present

## 2015-04-19 DIAGNOSIS — R609 Edema, unspecified: Secondary | ICD-10-CM | POA: Diagnosis not present

## 2015-07-12 DIAGNOSIS — Z950 Presence of cardiac pacemaker: Secondary | ICD-10-CM | POA: Diagnosis not present

## 2015-11-01 DIAGNOSIS — E039 Hypothyroidism, unspecified: Secondary | ICD-10-CM | POA: Diagnosis not present

## 2015-11-01 DIAGNOSIS — I10 Essential (primary) hypertension: Secondary | ICD-10-CM | POA: Diagnosis not present

## 2015-11-01 DIAGNOSIS — N183 Chronic kidney disease, stage 3 (moderate): Secondary | ICD-10-CM | POA: Diagnosis not present

## 2015-11-01 DIAGNOSIS — D649 Anemia, unspecified: Secondary | ICD-10-CM | POA: Diagnosis not present

## 2015-11-01 DIAGNOSIS — R251 Tremor, unspecified: Secondary | ICD-10-CM | POA: Diagnosis not present

## 2015-11-01 DIAGNOSIS — H9190 Unspecified hearing loss, unspecified ear: Secondary | ICD-10-CM | POA: Diagnosis not present

## 2015-11-01 DIAGNOSIS — M353 Polymyalgia rheumatica: Secondary | ICD-10-CM | POA: Diagnosis not present

## 2015-11-14 DIAGNOSIS — Z950 Presence of cardiac pacemaker: Secondary | ICD-10-CM | POA: Diagnosis not present

## 2015-12-06 DIAGNOSIS — Z23 Encounter for immunization: Secondary | ICD-10-CM | POA: Diagnosis not present

## 2016-02-17 NOTE — Progress Notes (Signed)
Gregory [name] Date of Birth: 19-Nov-1920 Medical Record [id number]  History of Present Illness: Gregory [name] is seen for cardiac follow up. He is a pleasant 83 yo WM with history of atrial flutter and pacemaker. He lives in [facility] but also has a house here in [place]. He reports  his cardiac care has been at the [facility] clinic in [place]. He reports that he had significant bradycardia in 2002 and had a pacemaker placed (Medtronic). This was revised in 2010.  He has a history of atrial flutter and had a successful ablation in the past. He denies any history of CAD, MI, or CHF.  More recently he has been staying in [place]. His wife has dementia and has been evaluated at [facility]. He has a brother in law that has been difficult to deal with. He denies any chest pain, dyspnea, or palpitations. No dizziness. He has lost his appetite again and lost weight since last visit.   Allergies as of 02/20/2016      Reactions   Penicillins Hives, Rash      Medication List       Accurate as of 02/20/16  5:40 PM. Always use your most recent med list.          apixaban 2.5 MG Tabs tablet Commonly known as:  ELIQUIS Take 1 tablet (2.5 mg total) by mouth 2 (two) times daily.   BIOTIN PO Take 1 tablet by mouth daily.   CALCIUM PO Take 1 tablet by mouth daily.   cholecalciferol 1000 units tablet Commonly known as:  VITAMIN D Take 3,000 Units by mouth daily.   Coenzyme Q10 200 MG Tabs Take 200 mg by mouth daily.   docusate sodium 100 MG capsule Commonly known as:  COLACE Take 100 mg by mouth daily.   levothyroxine 75 MCG tablet Commonly known as:  SYNTHROID, LEVOTHROID Take 75 mcg by mouth daily before breakfast.   MAGNESIUM PO Take 1-2 tablets by mouth daily.   NIACIN PO Take 1 tablet by mouth as needed.   POTASSIUM CHLORIDE PO Take 1 tablet by mouth daily.   vitamin B-12 1000 MCG tablet Commonly known as:  CYANOCOBALAMIN Take 1,000 mcg by mouth daily.   vitamin C  500 MG tablet Commonly known as:  ASCORBIC ACID Take 1,500 mg by mouth daily.        Allergies  Allergen Reactions  . Penicillins Hives and Rash    Past Medical History:  Diagnosis Date  . Atrial flutter ([place])    s/p ablation  . Cancer ([place])    skin cancer  . Cataracts, bilateral   . Hypertension   . Neuromuscular disorder ([place])   . Pacemaker 2010  . Thyroid disease     Past Surgical History:  Procedure Laterality Date  . APPENDECTOMY    . COLONOSCOPY    . EYE SURGERY Bilateral    cataract surgery  . HERNIA REPAIR Right   . INGUINAL HERNIA REPAIR Left 10/12/2013   Procedure: LEFT  INGUINAL HERNIA REPAIR;  Surgeon: Odis Hollingshead, MD;  Location: [place];  Service: General;  Laterality: Left;  . INSERTION OF MESH Left 10/12/2013   Procedure: INSERTION OF MESH;  Surgeon: Odis Hollingshead, MD;  Location: [place];  Service: General;  Laterality: Left;    Social History   Social History  . Marital status: Married    Spouse name: N/A  . Number of children: 2  . Years of education: N/A   Occupational History  .  retired Actor    Social History Main Topics  . Smoking status: Never Smoker  . Smokeless tobacco: Never Used  . Alcohol use No  . Drug use: No  . Sexual activity: No   Other Topics Concern  . None   Social History Narrative  . None    Family History  Problem Relation Age of Onset  . Heart disease Mother   . Cancer Father     leukemia    Review of Systems: As noted in HPI.  All other systems were reviewed and are negative.  Physical Exam: BP 112/64   Pulse (!) 121   Ht 5\' 9"  (1.753 m)   Wt 161 lb 4 oz (73.1 kg)   BMI 23.81 kg/m  Filed Weights   02/20/16 1537  Weight: 161 lb 4 oz (73.1 kg)  GENERAL:  Well appearing, elderly WM in NAD HEENT:  PERRL, EOMI, sclera are clear. Oropharynx is clear. NECK:  No jugular venous distention, carotid upstroke brisk and symmetric, no bruits, no thyromegaly or adenopathy LUNGS:  Clear to  auscultation bilaterally CHEST:  Unremarkable HEART:  IRRR0- tachy,  PMI not displaced or sustained,S1 and S2 within normal limits, there is a harsh grade 99991111 systolic murmur at the apex radiating to the RUSB ABD:  Soft, nontender. BS +, no masses or bruits. No hepatomegaly, no splenomegaly EXT:  2 + pulses throughout, no edema, no cyanosis no clubbing SKIN:  Warm and dry.  No rashes NEURO:  Alert and oriented x 3. Cranial nerves II through XII intact. PSYCH:  Cognitively intact    LABORATORY DATA:  Labs dated 04/19/15: cholesterol 153, triglycerides 50, LDL 87, HDL 56. Bun 29, creatinine 1.19. Other CMET normal. Hgb 10.6.  Ecg: today- atrial tracking and V pacing at rate 121 bpm. I suspect this represents Atrial flutter with pacing at upper rate limit. I have personally reviewed and interpreted this study.  Echo: 09/11/13:Study Conclusions  - Left ventricle: The cavity size was normal. There was mild focal basal hypertrophy of the septum. Systolic function was normal. Wall motion was normal; there were no regional wall motion abnormalities. There was a reduced contribution of atrial contraction to ventricular filling, due to increased ventricular diastolic pressure or atrial contractile dysfunction. Doppler parameters are consistent with a reversible restrictive pattern, indicative of decreased left ventricular diastolic compliance and/or increased left atrial pressure (grade 3 diastolic dysfunction). - Aortic valve: Severe diffuse thickening and calcification. Valve mobility was restricted. There was mild stenosis. Valve area (VTI): 1.31 cm^2. Valve area (Vmax): 1.43 cm^2. - Pulmonary arteries: PA peak pressure: 38 mm Hg (S).  Impressions:  - The right ventricular systolic pressure was increased consistent with mild pulmonary hypertension.   Assessment / Plan: 1. History of bradycardia s/p permanent pacemaker 2. History of atrial flutter s/p  ablation. Now appears to be in atrial flutter with RVR ( pacer tracking flutter). He is asymptomatic. [place] Vasc score of 2 based on age. No history of bleeding or CVA. I have recommended starting Eliquis 2.5 mg  Bid based on age and prior renal function. Will update lab work. Will arrange follow up in the Afib clinic. I think he will need pacemaker parameters reprogrammed due elevated HR. He will also need to get established with EP here since he is living here mostly now.  3. S/p right inguinal hernia repair.  4. History of aortic stenosis.

## 2016-02-20 ENCOUNTER — Ambulatory Visit (INDEPENDENT_AMBULATORY_CARE_PROVIDER_SITE_OTHER): Payer: Medicare Other | Admitting: Cardiology

## 2016-02-20 ENCOUNTER — Encounter: Payer: Self-pay | Admitting: Cardiology

## 2016-02-20 VITALS — BP 112/64 | HR 121 | Ht 69.0 in | Wt 161.2 lb

## 2016-02-20 DIAGNOSIS — I4892 Unspecified atrial flutter: Secondary | ICD-10-CM | POA: Diagnosis not present

## 2016-02-20 DIAGNOSIS — I350 Nonrheumatic aortic (valve) stenosis: Secondary | ICD-10-CM | POA: Diagnosis not present

## 2016-02-20 DIAGNOSIS — Z950 Presence of cardiac pacemaker: Secondary | ICD-10-CM | POA: Diagnosis not present

## 2016-02-20 MED ORDER — APIXABAN 2.5 MG PO TABS: 2.5000 mg | ORAL_TABLET | Freq: Two times a day (BID) | ORAL | Status: DC

## 2016-02-20 MED ORDER — APIXABAN 2.5 MG PO TABS: 2.5000 mg | ORAL_TABLET | Freq: Two times a day (BID) | ORAL | 6 refills | Status: DC

## 2016-02-20 NOTE — Patient Instructions (Signed)
We will start you on a blood thinner Eliquis 2.5 mg twice a day  Continue your other therapy  We will schedule you to see our rhythm specialist.  We will check blood work today.

## 2016-02-21 ENCOUNTER — Ambulatory Visit (HOSPITAL_COMMUNITY)
Admission: RE | Admit: 2016-02-21 | Discharge: 2016-02-21 | Disposition: A | Discharge status: Home / Self Care | Payer: Medicare Other | Source: Ambulatory Visit | Attending: Nurse Practitioner | Admitting: Nurse Practitioner

## 2016-02-21 ENCOUNTER — Other Ambulatory Visit: Payer: Self-pay | Admitting: *Deleted

## 2016-02-21 ENCOUNTER — Encounter (HOSPITAL_COMMUNITY): Payer: Self-pay | Admitting: Nurse Practitioner

## 2016-02-21 VITALS — BP 122/76 | HR 116 | Ht 69.0 in | Wt 162.0 lb

## 2016-02-21 DIAGNOSIS — Z9889 Other specified postprocedural states: Secondary | ICD-10-CM | POA: Insufficient documentation

## 2016-02-21 DIAGNOSIS — I4892 Unspecified atrial flutter: Secondary | ICD-10-CM

## 2016-02-21 DIAGNOSIS — Z880 Allergy status to penicillin: Secondary | ICD-10-CM | POA: Insufficient documentation

## 2016-02-21 DIAGNOSIS — Z806 Family history of leukemia: Secondary | ICD-10-CM | POA: Insufficient documentation

## 2016-02-21 DIAGNOSIS — Z85828 Personal history of other malignant neoplasm of skin: Secondary | ICD-10-CM | POA: Insufficient documentation

## 2016-02-21 DIAGNOSIS — I350 Nonrheumatic aortic (valve) stenosis: Secondary | ICD-10-CM | POA: Diagnosis not present

## 2016-02-21 DIAGNOSIS — Z7902 Long term (current) use of antithrombotics/antiplatelets: Secondary | ICD-10-CM | POA: Insufficient documentation

## 2016-02-21 DIAGNOSIS — Z79899 Other long term (current) drug therapy: Secondary | ICD-10-CM

## 2016-02-21 DIAGNOSIS — Z950 Presence of cardiac pacemaker: Secondary | ICD-10-CM | POA: Diagnosis not present

## 2016-02-21 DIAGNOSIS — I4891 Unspecified atrial fibrillation: Secondary | ICD-10-CM | POA: Insufficient documentation

## 2016-02-21 DIAGNOSIS — E079 Disorder of thyroid, unspecified: Secondary | ICD-10-CM | POA: Diagnosis not present

## 2016-02-21 DIAGNOSIS — I10 Essential (primary) hypertension: Secondary | ICD-10-CM | POA: Insufficient documentation

## 2016-02-21 DIAGNOSIS — H269 Unspecified cataract: Secondary | ICD-10-CM | POA: Diagnosis not present

## 2016-02-21 DIAGNOSIS — Z8249 Family history of ischemic heart disease and other diseases of the circulatory system: Secondary | ICD-10-CM | POA: Diagnosis not present

## 2016-02-21 LAB — COMPREHENSIVE METABOLIC PANEL
ALK PHOS: 48 U/L (ref 40–115)
ALT: 13 U/L (ref 9–46)
AST: 20 U/L (ref 10–35)
Albumin: 4.2 g/dL (ref 3.6–5.1)
BILIRUBIN TOTAL: 0.6 mg/dL (ref 0.2–1.2)
BUN: 27 mg/dL — ABNORMAL HIGH (ref 7–25)
CALCIUM: 9.1 mg/dL (ref 8.6–10.3)
CO2: 27 mmol/L (ref 20–31)
Chloride: 100 mmol/L (ref 98–110)
Creat: 1.26 mg/dL — ABNORMAL HIGH (ref 0.70–1.11)
GLUCOSE: 109 mg/dL — AB (ref 65–99)
Potassium: 4.5 mmol/L (ref 3.5–5.3)
Sodium: 135 mmol/L (ref 135–146)
TOTAL PROTEIN: 7 g/dL (ref 6.1–8.1)

## 2016-02-21 LAB — CBC WITH DIFFERENTIAL/PLATELET
BASOS PCT: 1 %
Basophils Absolute: 89 cells/uL (ref 0–200)
Eosinophils Absolute: 178 cells/uL (ref 15–500)
Eosinophils Relative: 2 %
HEMATOCRIT: 31.7 % — AB (ref 38.5–50.0)
Hemoglobin: 10.4 g/dL — ABNORMAL LOW (ref 13.2–17.1)
LYMPHS ABS: 2136 {cells}/uL (ref 850–3900)
Lymphocytes Relative: 24 %
MCH: 31.6 pg (ref 27.0–33.0)
MCHC: 32.8 g/dL (ref 32.0–36.0)
MCV: 96.4 fL (ref 80.0–100.0)
MONO ABS: 979 {cells}/uL — AB (ref 200–950)
MPV: 9.2 fL (ref 7.5–12.5)
Monocytes Relative: 11 %
NEUTROS ABS: 5518 {cells}/uL (ref 1500–7800)
Neutrophils Relative %: 62 %
Platelets: 229 10*3/uL (ref 140–400)
RBC: 3.29 MIL/uL — ABNORMAL LOW (ref 4.20–5.80)
RDW: 13.4 % (ref 11.0–15.0)
WBC: 8.9 10*3/uL (ref 3.8–10.8)

## 2016-02-21 LAB — TSH: TSH: 2.9 mIU/L (ref 0.40–4.50)
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
## 2016-02-29 ENCOUNTER — Institutional Professional Consult (permissible substitution): Payer: Federal, State, Local not specified - PPO | Admitting: Internal Medicine

## 2016-03-02 DIAGNOSIS — Z950 Presence of cardiac pacemaker: Secondary | ICD-10-CM | POA: Diagnosis not present

## 2016-03-06 DIAGNOSIS — Z008 Encounter for other general examination: Secondary | ICD-10-CM | POA: Diagnosis not present

## 2016-03-07 ENCOUNTER — Institutional Professional Consult (permissible substitution): Payer: Federal, State, Local not specified - PPO | Admitting: Internal Medicine

## 2016-03-14 ENCOUNTER — Encounter: Payer: Self-pay | Admitting: *Deleted

## 2016-03-15 ENCOUNTER — Institutional Professional Consult (permissible substitution): Payer: Federal, State, Local not specified - PPO | Admitting: Internal Medicine

## 2016-03-19 ENCOUNTER — Ambulatory Visit (INDEPENDENT_AMBULATORY_CARE_PROVIDER_SITE_OTHER): Payer: Medicare Other | Admitting: Internal Medicine

## 2016-03-19 ENCOUNTER — Encounter (INDEPENDENT_AMBULATORY_CARE_PROVIDER_SITE_OTHER): Payer: Self-pay

## 2016-03-19 ENCOUNTER — Encounter: Payer: Self-pay | Admitting: Internal Medicine

## 2016-03-19 VITALS — BP 138/60 | HR 60 | Ht 69.5 in | Wt 158.0 lb

## 2016-03-19 DIAGNOSIS — I4892 Unspecified atrial flutter: Secondary | ICD-10-CM | POA: Diagnosis not present

## 2016-03-19 DIAGNOSIS — I442 Atrioventricular block, complete: Secondary | ICD-10-CM | POA: Diagnosis not present

## 2016-03-19 NOTE — Patient Instructions (Signed)
Medication Instructions:  Your physician recommends that you continue on your current medications as directed. Please refer to the Current Medication list given to you today.   Labwork: None ordered   Testing/Procedures: None ordered   Follow-Up: Your physician recommends that you schedule a follow-up appointment in: 3 months with Amber Seiler, NP   Any Other Special Instructions Will Be Listed Below (If Applicable).     If you need a refill on your cardiac medications before your next appointment, please call your pharmacy.   

## 2016-03-19 NOTE — Progress Notes (Signed)
Carlena Sax, MD: Primary Cardiologist:  Dr Martin[place]  Gregory [name] is a 83 y.o. male with a h/o complete heart block sp PPM (MDT) in [place] in 2002 with generator change in 2010 who presents today to establish care in the Electrophysiology device clinic.  He continues to have much of his device care in [place].  Interestingly he lives in [place] and also in [place].  He is followed remotely from [place] for device care and still goes there once per year. He recently was found to have asymptomatic atypical atrial flutter.  He was evaluated in the AF clinic.  His mode switch rate was reduced to 130 bpm and he now no longer tracks the tachycardia rapidly.  He has been tolerated on eliquis.  He feels that he is at his baseline health state. He remains active.  Today, he  denies symptoms of palpitations, chest pain, shortness of breath, orthopnea, PND, lower extremity edema, dizziness, presyncope, syncope, or neurologic sequela.  The patientis tolerating medications without difficulties and is otherwise without complaint today.   Past Medical History:  Diagnosis Date  . Atrial flutter ([place])    s/p ablation  . Cancer ([place])    skin cancer  . Cataracts, bilateral   . Hypertension   . Neuromuscular disorder ([place])   . Pacemaker 2010  . Thyroid disease    Past Surgical History:  Procedure Laterality Date  . APPENDECTOMY    . COLONOSCOPY    . EYE SURGERY Bilateral    cataract surgery  . HERNIA REPAIR Right   . INGUINAL HERNIA REPAIR Left 10/12/2013   Procedure: LEFT  INGUINAL HERNIA REPAIR;  Surgeon: Odis Hollingshead, MD;  Location: [facility];  Service: General;  Laterality: Left;  . INSERTION OF MESH Left 10/12/2013   Procedure: INSERTION OF MESH;  Surgeon: Odis Hollingshead, MD;  Location: [facility];  Service: General;  Laterality: Left;    Social History   Social History  . Marital status: Married    Spouse name: N/A  . Number of children: 2  . Years of education: N/A    Occupational History  . retired Actor    Social History Main Topics  . Smoking status: Never Smoker  . Smokeless tobacco: Never Used  . Alcohol use No  . Drug use: No  . Sexual activity: No   Other Topics Concern  . Not on file   Social History Narrative  . No narrative on file    Family History  Problem Relation Age of Onset  . Heart disease Mother   . Cancer Father     leukemia    Allergies  Allergen Reactions  . Penicillins Hives and Rash    Current Outpatient Prescriptions  Medication Sig Dispense Refill  . apixaban (ELIQUIS) 2.5 MG TABS tablet Take 1 tablet (2.5 mg total) by mouth 2 (two) times daily. 60 tablet 6  . CALCIUM PO Take 1 tablet by mouth daily.     . cholecalciferol (VITAMIN D) 1000 UNITS tablet Take 3,000 Units by mouth daily.     . Coenzyme Q10 200 MG TABS Take 200 mg by mouth daily.     Marland Kitchen docusate sodium (COLACE) 100 MG capsule Take 100 mg by mouth daily.     Marland Kitchen levothyroxine (SYNTHROID, LEVOTHROID) 75 MCG tablet Take 75 mcg by mouth daily before breakfast.    . MAGNESIUM PO Take 1-2 tablets by mouth daily.     Marland Kitchen NIACIN PO Take 1 tablet by  mouth as directed.     Marland Kitchen POTASSIUM CHLORIDE PO Take 1 tablet by mouth daily.    . vitamin B-12 (CYANOCOBALAMIN) 1000 MCG tablet Take 1,000 mcg by mouth daily.     . vitamin C (ASCORBIC ACID) 500 MG tablet Take 1,500 mg by mouth daily.      Current Facility-Administered Medications  Medication Dose Route Frequency Provider Last Rate Last Dose  . apixaban (ELIQUIS) tablet 2.5 mg  2.5 mg Oral BID Peter M Martinique, MD        ROS- all systems are reviewed and negative except as per HPI  Physical Exam: Vitals:   03/19/16 1436  BP: 138/60  Pulse: 60  Weight: 158 lb (71.7 kg)  Height: 5' 9.5" (1.765 m)    GEN- The patient is elderly appearing, alert and oriented x 3 today.   Head- normocephalic, atraumatic Eyes-  Sclera clear, conjunctiva pink Ears- hearing intact Oropharynx- clear Neck- supple,    Lungs- Clear to ausculation bilaterally, normal work of breathing Chest- pacemaker pocket is well healed Heart- Regular rate and rhythm (paced) GI- soft, NT, ND, + BS Extremities- no clubbing, cyanosis, or edema MS- no significant deformity or atrophy Skin- no rash or lesion Psych- euthymic mood, full affect Neuro- strength and sensation are intact  Pacemaker interrogation- reviewed in detail today,  See PACEART report ekg today reveals V pacing  Assessment and Plan:  1. Atypical atrial flutter Asymptomatic I could not pace terminate in the office today but did try for quite some time. He is appropriately anticoagulated with eliquis Ultimately, I think rate control will be the appropriate strategy. Return in 3 months to see EP NP to assess for symptoms.  If symptomatic at that time, we could consider cardioversion.  Otherwise, I would favor rate control long term.  2. Complete heart block Normal pacemaker function See Pace Art report No changes today He wishes to continue to have his care through [facility].  I have informed him of 10-46 month battery longevity.  He will continue to follow remotely with [place].  I am happy to assume his device care at any time should he desire.  We would transfer remotes to [place] at that time.  Return to see EP NP in 3 months  Thompson Grayer MD, [facility] 03/19/2016 3:51 PM

## 2016-04-17 DIAGNOSIS — Z950 Presence of cardiac pacemaker: Secondary | ICD-10-CM | POA: Diagnosis not present

## 2016-04-17 DIAGNOSIS — I350 Nonrheumatic aortic (valve) stenosis: Secondary | ICD-10-CM | POA: Diagnosis not present

## 2016-04-18 DIAGNOSIS — Z950 Presence of cardiac pacemaker: Secondary | ICD-10-CM | POA: Diagnosis not present

## 2016-04-18 DIAGNOSIS — I350 Nonrheumatic aortic (valve) stenosis: Secondary | ICD-10-CM | POA: Diagnosis not present

## 2016-05-01 DIAGNOSIS — D649 Anemia, unspecified: Secondary | ICD-10-CM | POA: Diagnosis not present

## 2016-05-01 DIAGNOSIS — I10 Essential (primary) hypertension: Secondary | ICD-10-CM | POA: Diagnosis not present

## 2016-05-01 DIAGNOSIS — E039 Hypothyroidism, unspecified: Secondary | ICD-10-CM | POA: Diagnosis not present

## 2016-05-01 DIAGNOSIS — M353 Polymyalgia rheumatica: Secondary | ICD-10-CM | POA: Diagnosis not present

## 2016-05-01 DIAGNOSIS — R410 Disorientation, unspecified: Secondary | ICD-10-CM | POA: Diagnosis not present

## 2016-05-01 DIAGNOSIS — R040 Epistaxis: Secondary | ICD-10-CM | POA: Diagnosis not present

## 2016-05-02 DIAGNOSIS — J310 Chronic rhinitis: Secondary | ICD-10-CM | POA: Diagnosis not present

## 2016-05-15 LAB — CUP PACEART INCLINIC DEVICE CHECK
Battery Impedance: 2197 Ohm
Battery Remaining Longevity: 28 mo
Battery Voltage: 2.75 V
Brady Statistic AP VP Percent: 44 %
Brady Statistic AP VS Percent: 0 %
Brady Statistic AS VP Percent: 55 %
Date Time Interrogation Session: 20180205212338
Implantable Lead Implant Date: 20021114
Implantable Lead Location: 753860
Implantable Lead Model: 5076
Implantable Lead Model: 5568
Lead Channel Pacing Threshold Amplitude: 0.875 V
Lead Channel Pacing Threshold Amplitude: 0.875 V
Lead Channel Pacing Threshold Pulse Width: 0.4 ms
Lead Channel Sensing Intrinsic Amplitude: 2 mV
MDC IDC LEAD IMPLANT DT: 20100108
MDC IDC LEAD LOCATION: 753859
MDC IDC MSMT LEADCHNL RA IMPEDANCE VALUE: 650 Ohm
MDC IDC MSMT LEADCHNL RV IMPEDANCE VALUE: 522 Ohm
MDC IDC MSMT LEADCHNL RV PACING THRESHOLD PULSEWIDTH: 0.4 ms
MDC IDC PG IMPLANT DT: 20100108
MDC IDC SET LEADCHNL RA PACING AMPLITUDE: 1.75 V
MDC IDC SET LEADCHNL RV PACING AMPLITUDE: 2 V
MDC IDC SET LEADCHNL RV PACING PULSEWIDTH: 0.4 ms
MDC IDC SET LEADCHNL RV SENSING SENSITIVITY: 2.8 mV
MDC IDC STAT BRADY AS VS PERCENT: 1 %

## 2016-05-23 DIAGNOSIS — Z7901 Long term (current) use of anticoagulants: Secondary | ICD-10-CM | POA: Diagnosis not present

## 2016-05-23 DIAGNOSIS — H9190 Unspecified hearing loss, unspecified ear: Secondary | ICD-10-CM | POA: Diagnosis not present

## 2016-05-23 DIAGNOSIS — I10 Essential (primary) hypertension: Secondary | ICD-10-CM | POA: Diagnosis not present

## 2016-05-23 DIAGNOSIS — R251 Tremor, unspecified: Secondary | ICD-10-CM | POA: Diagnosis not present

## 2016-05-23 DIAGNOSIS — D649 Anemia, unspecified: Secondary | ICD-10-CM | POA: Diagnosis not present

## 2016-05-23 DIAGNOSIS — Z950 Presence of cardiac pacemaker: Secondary | ICD-10-CM | POA: Diagnosis not present

## 2016-05-23 DIAGNOSIS — Z0000 Encounter for general adult medical examination without abnormal findings: Secondary | ICD-10-CM | POA: Diagnosis not present

## 2016-05-23 DIAGNOSIS — I4891 Unspecified atrial fibrillation: Secondary | ICD-10-CM | POA: Diagnosis not present

## 2016-05-23 DIAGNOSIS — E039 Hypothyroidism, unspecified: Secondary | ICD-10-CM | POA: Diagnosis not present

## 2016-05-23 DIAGNOSIS — M353 Polymyalgia rheumatica: Secondary | ICD-10-CM | POA: Diagnosis not present

## 2016-05-23 DIAGNOSIS — N183 Chronic kidney disease, stage 3 (moderate): Secondary | ICD-10-CM | POA: Diagnosis not present

## 2016-05-31 NOTE — Progress Notes (Signed)
Electrophysiology Office Note Date: 06/01/2016  ID:  Gregory [name], DOB Oct 23, 1920, MRN [id number]  PCP: Carlena Sax, MD Primary Cardiologist: Martinique Electrophysiologist: Allred  CC: Atrial flutter follow-up  Gregory [name] is a 83 y.o. male seen today for Dr [name].  He presents today for routine electrophysiology followup.  Since last being seen in our clinic, the patient reports doing reasonably well.  He has not noticed any change in functional status.  He has taken trips to [facility] and [facility] since being seen last.  He does not feel like he has much energy but otherwise denies chest pain, palpitations, dyspnea, PND, orthopnea, nausea, vomiting, dizziness, syncope, edema, weight gain, or early satiety.  Device History: MDT dual chamber PPM implanted 2002 for complete heart block; gen change 2010   Past Medical History:  Diagnosis Date  . Atrial flutter ([place])    s/p ablation  . Cancer ([place])    skin cancer  . Cataracts, bilateral   . Hypertension   . Neuromuscular disorder ([place])   . Pacemaker 2010  . Thyroid disease    Past Surgical History:  Procedure Laterality Date  . APPENDECTOMY    . COLONOSCOPY    . EYE SURGERY Bilateral    cataract surgery  . HERNIA REPAIR Right   . INGUINAL HERNIA REPAIR Left 10/12/2013   Procedure: LEFT  INGUINAL HERNIA REPAIR;  Surgeon: Odis Hollingshead, MD;  Location: [facility];  Service: General;  Laterality: Left;  . INSERTION OF MESH Left 10/12/2013   Procedure: INSERTION OF MESH;  Surgeon: Odis Hollingshead, MD;  Location: [facility];  Service: General;  Laterality: Left;    Current Outpatient Prescriptions  Medication Sig Dispense Refill  . apixaban (ELIQUIS) 2.5 MG TABS tablet Take 1 tablet (2.5 mg total) by mouth 2 (two) times daily. 60 tablet 6  . CALCIUM PO Take 1 tablet by mouth daily.     . cholecalciferol (VITAMIN D) 1000 UNITS tablet Take 3,000 Units by mouth daily.     . Coenzyme Q10 200 MG TABS Take 200 mg by mouth daily.     Marland Kitchen  docusate sodium (COLACE) 100 MG capsule Take 100 mg by mouth daily.     Marland Kitchen levothyroxine (SYNTHROID, LEVOTHROID) 75 MCG tablet Take 75 mcg by mouth daily before breakfast.    . MAGNESIUM PO Take 1-2 tablets by mouth daily.     Marland Kitchen NIACIN PO Take 1 tablet by mouth as directed.     Marland Kitchen POTASSIUM CHLORIDE PO Take 1 tablet by mouth daily.    . vitamin B-12 (CYANOCOBALAMIN) 1000 MCG tablet Take 1,000 mcg by mouth daily.     . vitamin C (ASCORBIC ACID) 500 MG tablet Take 1,500 mg by mouth daily.      Current Facility-Administered Medications  Medication Dose Route Frequency Provider Last Rate Last Dose  . apixaban (ELIQUIS) tablet 2.5 mg  2.5 mg Oral BID Peter M Martinique, MD        Allergies:   Penicillins   Social History: Social History   Social History  . Marital status: Married    Spouse name: N/A  . Number of children: 2  . Years of education: N/A   Occupational History  . retired Actor    Social History Main Topics  . Smoking status: Never Smoker  . Smokeless tobacco: Never Used  . Alcohol use No  . Drug use: No  . Sexual activity: No   Other Topics Concern  . Not on file  Social History Narrative  . No narrative on file    Family History: Family History  Problem Relation Age of Onset  . Heart disease Mother   . Cancer Father     leukemia     Review of Systems:  All other systems reviewed and are otherwise negative except as noted above.   Physical Exam: VS:  BP 115/68   Pulse 60   Ht 5' 9.5" (1.765 m)   Wt 155 lb 12.8 oz (70.7 kg)   SpO2 98%   BMI 22.68 kg/m  , BMI Body mass index is 22.68 kg/m.  GEN- The patient is elderly appearing, alert and oriented x 3 today.   HEENT: normocephalic, atraumatic; sclera clear, conjunctiva pink; hearing intact; oropharynx clear; neck supple  Lungs- Clear to ausculation bilaterally, normal work of breathing.  No wheezes, rales, rhonchi Heart- Regular rate and rhythm (paced) GI- soft, non-tender, non-distended,  bowel sounds present  Extremities- no clubbing, cyanosis, or edema  MS- no significant deformity or atrophy Skin- warm and dry, no rash or lesion; PPM pocket well healed Psych- euthymic mood, full affect Neuro- strength and sensation are intact  PPM Interrogation- reviewed in detail today,  See PACEART report  EKG:  EKG is not ordered today.  Recent Labs: 02/21/2016: ALT 13; BUN 27; Creat 1.26; Hemoglobin 10.4; Platelets 229; Potassium 4.5; Sodium 135; TSH 2.90   Wt Readings from Last 3 Encounters:  06/01/16 155 lb 12.8 oz (70.7 kg)  03/19/16 158 lb (71.7 kg)  02/21/16 162 lb (73.5 kg)     Other studies Reviewed: Additional studies/ records that were reviewed today include: Dr [name] and Dr Doug Sou office notes  Assessment and Plan:  1.  Complete heart block Normal PPM function See Pace Art report No changes today Estimated longevity 28 months. He continues to wish to have his device followed in [place]. We have again discussed importance of regular remote follow up to follow battery.   2.  Atrial flutter Reverted to SR Continue Eliquis long term for CHADS2VASC of 3  3.  Chronotropic incompetence Rate response turned on today   Current medicines are reviewed at length with the patient today.   The patient does not have concerns regarding his medicines.  The following changes were made today:  none  Labs/ tests ordered today include: none No orders of the defined types were placed in this encounter.    Disposition:   Follow up with me in 6 months    Signed, Chanetta Marshall, NP 06/01/2016 9:58 AM  [place] [address] [phone] (office) [phone] (fax

## 2016-06-01 ENCOUNTER — Encounter: Payer: Self-pay | Admitting: Nurse Practitioner

## 2016-06-01 ENCOUNTER — Encounter (INDEPENDENT_AMBULATORY_CARE_PROVIDER_SITE_OTHER): Payer: Self-pay

## 2016-06-01 ENCOUNTER — Ambulatory Visit (INDEPENDENT_AMBULATORY_CARE_PROVIDER_SITE_OTHER): Payer: Medicare Other | Admitting: Nurse Practitioner

## 2016-06-01 VITALS — BP 115/68 | HR 60 | Ht 69.5 in | Wt 155.8 lb

## 2016-06-01 DIAGNOSIS — I4892 Unspecified atrial flutter: Secondary | ICD-10-CM | POA: Diagnosis not present

## 2016-06-01 DIAGNOSIS — I442 Atrioventricular block, complete: Secondary | ICD-10-CM | POA: Diagnosis not present

## 2016-06-01 DIAGNOSIS — I4589 Other specified conduction disorders: Secondary | ICD-10-CM | POA: Diagnosis not present

## 2016-06-01 NOTE — Patient Instructions (Signed)
Medication Instructions:   Your physician recommends that you continue on your current medications as directed. Please refer to the Current Medication list given to you today.    If you need a refill on your cardiac medications before your next appointment, please call your pharmacy.  Labwork: NONE ORDERED  TODAY    Testing/Procedures: NONE ORDERED  TODAY   Follow-Up:  Your physician wants you to follow-up in:  IN [place] will receive a reminder letter in the mail two months in advance. If you don't receive a letter, please call our office to schedule the follow-up appointment.      Any Other Special Instructions Will Be Listed Below (If Applicable).

## 2016-06-08 DIAGNOSIS — Z950 Presence of cardiac pacemaker: Secondary | ICD-10-CM | POA: Diagnosis not present

## 2016-06-15 ENCOUNTER — Ambulatory Visit: Payer: Federal, State, Local not specified - PPO | Admitting: Nurse Practitioner

## 2016-08-03 ENCOUNTER — Encounter: Payer: Self-pay | Admitting: *Deleted

## 2016-08-21 NOTE — Progress Notes (Deleted)
Gregory [name] Date of Birth: 1920/06/11 Medical Record #[id number]  History of Present Illness: Gregory [name] is seen for cardiac follow up. He is a pleasant 83 yo WM with history of atrial flutter and pacemaker. He lives in [facility] but also has a house here in [place]. He reports  his cardiac care has been at the [facility] clinic in [place]. He reports that he had significant bradycardia in 2002 and had a pacemaker placed (Medtronic). This was revised in 2010.  He has a history of atrial flutter and had a successful ablation in the past. He denies any history of CAD, MI, or CHF. When last seen he was found to have recurrent atrial flutter and was tracking this rapidly. He was started on Eliquis. Seen by EP and mode switch was adjusted to avoid rapid HR. When seen in April he was back in NSR.  More recently he has been staying in [place]. His wife has dementia and has been evaluated at [facility]. He has a brother in law that has been difficult to deal with. He denies any chest pain, dyspnea, or palpitations. No dizziness. He has lost his appetite again and lost weight since last visit.   Allergies as of 08/23/2016      Reactions   Penicillins Hives, Rash      Medication List       Accurate as of 08/21/16  5:48 PM. Always use your most recent med list.          apixaban 2.5 MG Tabs tablet Commonly known as:  ELIQUIS Take 1 tablet (2.5 mg total) by mouth 2 (two) times daily.   CALCIUM PO Take 1 tablet by mouth daily.   cholecalciferol 1000 units tablet Commonly known as:  VITAMIN D Take 3,000 Units by mouth daily.   Coenzyme Q10 200 MG Tabs Take 200 mg by mouth daily.   docusate sodium 100 MG capsule Commonly known as:  COLACE Take 100 mg by mouth daily.   levothyroxine 75 MCG tablet Commonly known as:  SYNTHROID, LEVOTHROID Take 75 mcg by mouth daily before breakfast.   MAGNESIUM PO Take 1-2 tablets by mouth daily.   NIACIN PO Take 1 tablet by mouth as  directed.   POTASSIUM CHLORIDE PO Take 1 tablet by mouth daily.   vitamin B-12 1000 MCG tablet Commonly known as:  CYANOCOBALAMIN Take 1,000 mcg by mouth daily.   vitamin C 500 MG tablet Commonly known as:  ASCORBIC ACID Take 1,500 mg by mouth daily.        Allergies  Allergen Reactions  . Penicillins Hives and Rash    Past Medical History:  Diagnosis Date  . Atrial flutter ([place])    s/p ablation  . Cancer ([place])    skin cancer  . Cataracts, bilateral   . Hypertension   . Neuromuscular disorder ([place])   . Pacemaker 2010  . Thyroid disease     Past Surgical History:  Procedure Laterality Date  . APPENDECTOMY    . COLONOSCOPY    . EYE SURGERY Bilateral    cataract surgery  . HERNIA REPAIR Right   . INGUINAL HERNIA REPAIR Left 10/12/2013   Procedure: LEFT  INGUINAL HERNIA REPAIR;  Surgeon: Odis Hollingshead, MD;  Location: [facility];  Service: General;  Laterality: Left;  . INSERTION OF MESH Left 10/12/2013   Procedure: INSERTION OF MESH;  Surgeon: Odis Hollingshead, MD;  Location: [facility];  Service: General;  Laterality: Left;    Social  History   Social History  . Marital status: Married    Spouse name: N/A  . Number of children: 2  . Years of education: N/A   Occupational History  . retired Actor    Social History Main Topics  . Smoking status: Never Smoker  . Smokeless tobacco: Never Used  . Alcohol use No  . Drug use: No  . Sexual activity: No   Other Topics Concern  . Not on file   Social History Narrative  . No narrative on file    Family History  Problem Relation Age of Onset  . Heart disease Mother   . Cancer Father        leukemia    Review of Systems: As noted in HPI.  All other systems were reviewed and are negative.  Physical Exam: There were no vitals taken for this visit. There were no vitals filed for this visit.GENERAL:  Well appearing, elderly WM in NAD HEENT:  PERRL, EOMI, sclera are clear. Oropharynx is clear. NECK:   No jugular venous distention, carotid upstroke brisk and symmetric, no bruits, no thyromegaly or adenopathy LUNGS:  Clear to auscultation bilaterally CHEST:  Unremarkable HEART:  IRRR0- tachy,  PMI not displaced or sustained,S1 and S2 within normal limits, there is a harsh grade 7-6/1 systolic murmur at the apex radiating to the RUSB ABD:  Soft, nontender. BS +, no masses or bruits. No hepatomegaly, no splenomegaly EXT:  2 + pulses throughout, no edema, no cyanosis no clubbing SKIN:  Warm and dry.  No rashes NEURO:  Alert and oriented x 3. Cranial nerves II through XII intact. PSYCH:  Cognitively intact    LABORATORY DATA: Lab Results  Component Value Date   WBC 8.9 02/21/2016   HGB 10.4 (L) 02/21/2016   HCT 31.7 (L) 02/21/2016   PLT 229 02/21/2016   GLUCOSE 109 (H) 02/21/2016   ALT 13 02/21/2016   AST 20 02/21/2016   NA 135 02/21/2016   K 4.5 02/21/2016   CL 100 02/21/2016   CREATININE 1.26 (H) 02/21/2016   BUN 27 (H) 02/21/2016   CO2 27 02/21/2016   TSH 2.90 02/21/2016   INR 1.04 10/02/2013     Labs dated 04/19/15: cholesterol 153, triglycerides 50, LDL 87, HDL 56. Bun 29, creatinine 1.19. Other CMET normal. Hgb 10.6.   Echo: 09/11/13:Study Conclusions  - Left ventricle: The cavity size was normal. There was mild focal basal hypertrophy of the septum. Systolic function was normal. Wall motion was normal; there were no regional wall motion abnormalities. There was a reduced contribution of atrial contraction to ventricular filling, due to increased ventricular diastolic pressure or atrial contractile dysfunction. Doppler parameters are consistent with a reversible restrictive pattern, indicative of decreased left ventricular diastolic compliance and/or increased left atrial pressure (grade 3 diastolic dysfunction). - Aortic valve: Severe diffuse thickening and calcification. Valve mobility was restricted. There was mild stenosis. Valve area (VTI):  1.31 cm^2. Valve area (Vmax): 1.43 cm^2. - Pulmonary arteries: PA peak pressure: 38 mm Hg (S).  Impressions:  - The right ventricular systolic pressure was increased consistent with mild pulmonary hypertension.   Assessment / Plan: 1. History of bradycardia s/p permanent pacemaker 2. History of atrial flutter s/p ablation. Paroxysmal.He is asymptomatic. [place] Vasc score of 2 based on age. No history of bleeding or CVA. I have recommended continuing Eliquis 2.5 mg  Bid based on age and prior renal function.  3. S/p right inguinal hernia repair.  4. History of aortic  stenosis.

## 2016-08-23 ENCOUNTER — Ambulatory Visit: Payer: Medicare Other | Admitting: Cardiology

## 2016-10-08 ENCOUNTER — Other Ambulatory Visit: Payer: Self-pay | Admitting: Cardiology

## 2016-11-09 NOTE — Progress Notes (Signed)
Gregory [name] Date of Birth: [birth year] Medical Record #[id number]  History of Present Illness: Gregory [name] is seen for cardiac follow up. He is a pleasant 83 yo WM with history of atrial flutter and pacemaker. He lives in [facility] but also has a house here in [place]. He reports  his cardiac care had been at the [facility] in [place]. He reports that he had significant bradycardia in 2002 and had a pacemaker placed (Medtronic). This was revised in 2010.  He has a history of atrial flutter and had a successful ablation in the past. He denies any history of CAD, MI, or CHF. When last seen by me in January he was found to be in atrial flutter and was tracking this at a high rate. He was started on Eliquis. Seen by Dr. Rayann [name] in February and pacemaker reprogrammed so he wouldn't track as fast. Attempted to pace terminate his Aflutter without success. When seen later in April he was found to be back in NSR. More recently he has been staying more in [place]. His wife has dementia.  He has a brother in law that has "lost his mind" and has been in the hospital repeatedly. He notes it all falls on him.  He denies any chest pain, dyspnea, or palpitations. No dizziness.   Allergies as of 11/14/2016      Reactions   Penicillins Hives, Rash      Medication List       Accurate as of 11/14/16 11:35 AM. Always use your most recent med list.          CALCIUM PO Take 1 tablet by mouth daily.   cholecalciferol 1000 units tablet Commonly known as:  VITAMIN D Take 3,000 Units by mouth daily.   Coenzyme Q10 200 MG Tabs Take 200 mg by mouth daily.   docusate sodium 100 MG capsule Commonly known as:  COLACE Take 100 mg by mouth daily.   ELIQUIS 2.5 MG Tabs tablet Generic drug:  apixaban TAKE 1 TABLET (2.5 MG TOTAL) BY MOUTH 2 (TWO) TIMES DAILY.   levothyroxine 75 MCG tablet Commonly known as:  SYNTHROID, LEVOTHROID Take 75 mcg by mouth daily before breakfast.   MAGNESIUM  PO Take 1-2 tablets by mouth daily.   NIACIN PO Take 1 tablet by mouth as directed.   POTASSIUM CHLORIDE PO Take 1 tablet by mouth daily.   vitamin B-12 1000 MCG tablet Commonly known as:  CYANOCOBALAMIN Take 1,000 mcg by mouth daily.   vitamin C 500 MG tablet Commonly known as:  ASCORBIC ACID Take 1,500 mg by mouth daily.        Allergies  Allergen Reactions  . Penicillins Hives and Rash    Past Medical History:  Diagnosis Date  . Atrial flutter ([place])    s/p ablation  . Cancer ([place])    skin cancer  . Cataracts, bilateral   . Hypertension   . Neuromuscular disorder ([place])   . Pacemaker 2010  . Thyroid disease     Past Surgical History:  Procedure Laterality Date  . APPENDECTOMY    . COLONOSCOPY    . EYE SURGERY Bilateral    cataract surgery  . HERNIA REPAIR Right   . INGUINAL HERNIA REPAIR Left 10/12/2013   Procedure: LEFT  INGUINAL HERNIA REPAIR;  Surgeon: Odis Hollingshead, MD;  Location: [facility];  Service: General;  Laterality: Left;  . INSERTION OF MESH Left 10/12/2013   Procedure: INSERTION OF MESH;  Surgeon: Rhunette Croft  Zella Richer, MD;  Location: [facility];  Service: General;  Laterality: Left;    Social History   Social History  . Marital status: Married    Spouse name: N/A  . Number of children: 2  . Years of education: N/A   Occupational History  . retired Actor    Social History Main Topics  . Smoking status: Never Smoker  . Smokeless tobacco: Never Used  . Alcohol use No  . Drug use: No  . Sexual activity: No   Other Topics Concern  . None   Social History Narrative  . None    Family History  Problem Relation Age of Onset  . Heart disease Mother   . Cancer Father        leukemia    Review of Systems: As noted in HPI.  All other systems were reviewed and are negative.  Physical Exam: BP (!) 142/72   Pulse 62   Ht 5' 9.5" (1.765 m)   Wt 156 lb 6.4 oz (70.9 kg)   BMI 22.77 kg/m  Filed Weights   11/14/16 1115  Weight:  156 lb 6.4 oz (70.9 kg)  GENERAL:  Well appearing, elderly WM in NAD HEENT:  PERRL, EOMI, sclera are clear. Oropharynx is clear. NECK:  No jugular venous distention, carotid upstroke brisk and symmetric, no bruits, no thyromegaly or adenopathy LUNGS:  Clear to auscultation bilaterally CHEST:  Unremarkable HEART:  RRR,  PMI not displaced or sustained,S1 and S2 within normal limits, there is a harsh grade 1-7/9 systolic murmur at the apex radiating to the RUSB ABD:  Soft, nontender. BS +, no masses or bruits. No hepatomegaly, no splenomegaly EXT:  2 + pulses throughout, no edema, no cyanosis no clubbing SKIN:  Warm and dry.  No rashes NEURO:  Alert and oriented x 3. Cranial nerves II through XII intact. PSYCH:  Cognitively intact    LABORATORY DATA:  Labs dated 04/19/15: cholesterol 153, triglycerides 50, LDL 87, HDL 56. Bun 29, creatinine 1.19. Other CMET normal. Hgb 10.6. Dated 05/23/16: creatinine 1.12. Other chemistries normal.  Ecg shows V paced rhythm. Rate 62.   Assessment / Plan: 1. History of bradycardia s/p permanent pacemaker 2. History of atrial flutter s/p ablation. Had recurrent Atrial flutter earlier this year. Later converted back to NSR. He is asymptomatic. [place] Vasc score of 2 based on age. No history of bleeding or CVA. I have recommended continuing  Eliquis 2.5 mg  Bid. 3. S/p right inguinal hernia repair.  4. History of mild aortic stenosis. Exam suggest this is more in the moderate range but will not pursue further since he is asymptomatic and with his advanced age.

## 2016-11-12 DIAGNOSIS — Z23 Encounter for immunization: Secondary | ICD-10-CM | POA: Diagnosis not present

## 2016-11-14 ENCOUNTER — Encounter: Payer: Self-pay | Admitting: Cardiology

## 2016-11-14 ENCOUNTER — Ambulatory Visit (INDEPENDENT_AMBULATORY_CARE_PROVIDER_SITE_OTHER): Payer: Medicare Other | Admitting: Cardiology

## 2016-11-14 VITALS — BP 142/72 | HR 62 | Ht 69.5 in | Wt 156.4 lb

## 2016-11-14 DIAGNOSIS — Z950 Presence of cardiac pacemaker: Secondary | ICD-10-CM

## 2016-11-14 DIAGNOSIS — I442 Atrioventricular block, complete: Secondary | ICD-10-CM | POA: Diagnosis not present

## 2016-11-14 DIAGNOSIS — I350 Nonrheumatic aortic (valve) stenosis: Secondary | ICD-10-CM

## 2016-11-14 DIAGNOSIS — I4892 Unspecified atrial flutter: Secondary | ICD-10-CM

## 2016-11-14 NOTE — Patient Instructions (Signed)
Continue same medications.   Your physician wants you to follow-up in: 6 months.  You will receive a reminder letter in the mail two months in advance. If you don't receive a letter, please call our office to schedule the follow-up appointment.  

## 2016-11-19 DIAGNOSIS — E039 Hypothyroidism, unspecified: Secondary | ICD-10-CM | POA: Diagnosis not present

## 2016-11-19 DIAGNOSIS — I10 Essential (primary) hypertension: Secondary | ICD-10-CM | POA: Diagnosis not present

## 2016-11-19 DIAGNOSIS — N183 Chronic kidney disease, stage 3 (moderate): Secondary | ICD-10-CM | POA: Diagnosis not present

## 2016-11-19 DIAGNOSIS — Z950 Presence of cardiac pacemaker: Secondary | ICD-10-CM | POA: Diagnosis not present

## 2016-11-19 DIAGNOSIS — I4892 Unspecified atrial flutter: Secondary | ICD-10-CM | POA: Diagnosis not present

## 2017-01-21 DIAGNOSIS — H43813 Vitreous degeneration, bilateral: Secondary | ICD-10-CM | POA: Diagnosis not present

## 2017-01-21 DIAGNOSIS — H353132 Nonexudative age-related macular degeneration, bilateral, intermediate dry stage: Secondary | ICD-10-CM | POA: Diagnosis not present

## 2017-04-22 DIAGNOSIS — R05 Cough: Secondary | ICD-10-CM | POA: Diagnosis not present

## 2017-05-24 DIAGNOSIS — Z950 Presence of cardiac pacemaker: Secondary | ICD-10-CM | POA: Diagnosis not present

## 2017-05-24 DIAGNOSIS — Z0000 Encounter for general adult medical examination without abnormal findings: Secondary | ICD-10-CM | POA: Diagnosis not present

## 2017-05-24 DIAGNOSIS — I4891 Unspecified atrial fibrillation: Secondary | ICD-10-CM | POA: Diagnosis not present

## 2017-05-24 DIAGNOSIS — Z1389 Encounter for screening for other disorder: Secondary | ICD-10-CM | POA: Diagnosis not present

## 2017-05-24 DIAGNOSIS — I10 Essential (primary) hypertension: Secondary | ICD-10-CM | POA: Diagnosis not present

## 2017-05-24 DIAGNOSIS — E039 Hypothyroidism, unspecified: Secondary | ICD-10-CM | POA: Diagnosis not present

## 2017-05-24 DIAGNOSIS — N183 Chronic kidney disease, stage 3 (moderate): Secondary | ICD-10-CM | POA: Diagnosis not present

## 2017-05-24 DIAGNOSIS — M353 Polymyalgia rheumatica: Secondary | ICD-10-CM | POA: Diagnosis not present

## 2017-05-24 DIAGNOSIS — Z7901 Long term (current) use of anticoagulants: Secondary | ICD-10-CM | POA: Diagnosis not present

## 2017-06-06 ENCOUNTER — Encounter: Payer: Self-pay | Admitting: Cardiology

## 2017-06-19 NOTE — Progress Notes (Deleted)
Gregory [name] Date of Birth: 1920-06-30 Medical Record #[id number]  History of Present Illness: [name] is seen for cardiac follow up. He is a pleasant 84 yo WM with history of atrial flutter and pacemaker. He lives in [facility] but also has a house here in [place]. He reports  his cardiac care had been at the [facility] clinic in [place]. He reports that he had significant bradycardia in 2002 and had a pacemaker placed (Medtronic). This was revised in 2010.  He has a history of atrial flutter and had a successful ablation in the past. He denies any history of CAD, MI, or CHF. When last seen by me in January he was found to be in atrial flutter and was tracking this at a high rate. He was started on Eliquis. Seen by Dr. [name] in February and pacemaker reprogrammed so he wouldn't track as fast. Attempted to pace terminate his Aflutter without success. When seen later in April he was found to be back in NSR. More recently he has been staying more in [place]. His wife has dementia.  He has a brother in law that has "lost his mind" and has been in the hospital repeatedly. He notes it all falls on him.  He denies any chest pain, dyspnea, or palpitations. No dizziness.   Allergies as of 06/20/2017      Reactions   Penicillins Hives, Rash      Medication List        Accurate as of 06/19/17  7:37 AM. Always use your most recent med list.          CALCIUM PO Take 1 tablet by mouth daily.   cholecalciferol 1000 units tablet Commonly known as:  VITAMIN D Take 3,000 Units by mouth daily.   Coenzyme Q10 200 MG Tabs Take 200 mg by mouth daily.   docusate sodium 100 MG capsule Commonly known as:  COLACE Take 100 mg by mouth daily.   ELIQUIS 2.5 MG Tabs tablet Generic drug:  apixaban TAKE 1 TABLET (2.5 MG TOTAL) BY MOUTH 2 (TWO) TIMES DAILY.   levothyroxine 75 MCG tablet Commonly known as:  SYNTHROID, LEVOTHROID Take 75 mcg by mouth daily before breakfast.   MAGNESIUM  PO Take 1-2 tablets by mouth daily.   NIACIN PO Take 1 tablet by mouth as directed.   POTASSIUM CHLORIDE PO Take 1 tablet by mouth daily.   vitamin B-12 1000 MCG tablet Commonly known as:  CYANOCOBALAMIN Take 1,000 mcg by mouth daily.   vitamin C 500 MG tablet Commonly known as:  ASCORBIC ACID Take 1,500 mg by mouth daily.        Allergies  Allergen Reactions  . Penicillins Hives and Rash    Past Medical History:  Diagnosis Date  . Atrial flutter ([place])    s/p ablation  . Cancer ([place])    skin cancer  . Cataracts, bilateral   . Hypertension   . Neuromuscular disorder ([place])   . Pacemaker 2010  . Thyroid disease     Past Surgical History:  Procedure Laterality Date  . APPENDECTOMY    . COLONOSCOPY    . EYE SURGERY Bilateral    cataract surgery  . HERNIA REPAIR Right   . INGUINAL HERNIA REPAIR Left 10/12/2013   Procedure: LEFT  INGUINAL HERNIA REPAIR;  Surgeon: Odis Hollingshead, MD;  Location: [place];  Service: General;  Laterality: Left;  . INSERTION OF MESH Left 10/12/2013   Procedure: INSERTION OF MESH;  Surgeon:  Odis Hollingshead, MD;  Location: [place];  Service: General;  Laterality: Left;    Social History   Socioeconomic History  . Marital status: Married    Spouse name: Not on file  . Number of children: 2  . Years of education: Not on file  . Highest education level: Not on file  Occupational History  . Occupation: retired Scientific laboratory technician Needs  . Financial resource strain: Not on file  . Food insecurity:    Worry: Not on file    Inability: Not on file  . Transportation needs:    Medical: Not on file    Non-medical: Not on file  Tobacco Use  . Smoking status: Never Smoker  . Smokeless tobacco: Never Used  Substance and Sexual Activity  . Alcohol use: No  . Drug use: No  . Sexual activity: Never  Lifestyle  . Physical activity:    Days per week: Not on file    Minutes per session: Not on file  . Stress: Not on file   Relationships  . Social connections:    Talks on phone: Not on file    Gets together: Not on file    Attends religious service: Not on file    Active member of club or organization: Not on file    Attends meetings of clubs or organizations: Not on file    Relationship status: Not on file  Other Topics Concern  . Not on file  Social History Narrative  . Not on file    Family History  Problem Relation Age of Onset  . Heart disease Mother   . Cancer Father        leukemia    Review of Systems: As noted in HPI.  All other systems were reviewed and are negative.  Physical Exam: There were no vitals taken for this visit. There were no vitals filed for this visit.GENERAL:  Well appearing, elderly WM in NAD HEENT:  PERRL, EOMI, sclera are clear. Oropharynx is clear. NECK:  No jugular venous distention, carotid upstroke brisk and symmetric, no bruits, no thyromegaly or adenopathy LUNGS:  Clear to auscultation bilaterally CHEST:  Unremarkable HEART:  RRR,  PMI not displaced or sustained,S1 and S2 within normal limits, there is a harsh grade 8-1/1 systolic murmur at the apex radiating to the RUSB ABD:  Soft, nontender. BS +, no masses or bruits. No hepatomegaly, no splenomegaly EXT:  2 + pulses throughout, no edema, no cyanosis no clubbing SKIN:  Warm and dry.  No rashes NEURO:  Alert and oriented x 3. Cranial nerves II through XII intact. PSYCH:  Cognitively intact    LABORATORY DATA: Lab Results  Component Value Date   WBC 8.9 02/21/2016   HGB 10.4 (L) 02/21/2016   HCT 31.7 (L) 02/21/2016   PLT 229 02/21/2016   GLUCOSE 109 (H) 02/21/2016   ALT 13 02/21/2016   AST 20 02/21/2016   NA 135 02/21/2016   K 4.5 02/21/2016   CL 100 02/21/2016   CREATININE 1.26 (H) 02/21/2016   BUN 27 (H) 02/21/2016   CO2 27 02/21/2016   TSH 2.90 02/21/2016   INR 1.04 10/02/2013     Labs dated 04/19/15: cholesterol 153, triglycerides 50, LDL 87, HDL 56. Bun 29, creatinine 1.19. Other CMET  normal. Hgb 10.6. Dated 05/23/16: creatinine 1.12. Other chemistries normal. Dated 05/24/17: cholesterol 168, triglycerides 65, HDL 65, LDL 90. Hgb 10.6. Creatinine 1.27. Other chemistries and TSH normal.   Ecg shows V paced  rhythm. Rate 62.   Assessment / Plan: 1. History of bradycardia s/p permanent pacemaker 2. History of atrial flutter s/p ablation. Had recurrent Atrial flutter earlier this year. Later converted back to NSR. He is asymptomatic. [place] Vasc score of 2 based on age. No history of bleeding or CVA. I have recommended continuing  Eliquis 2.5 mg  Bid. 3. S/p right inguinal hernia repair.  4. History of mild aortic stenosis. Exam suggest this is more in the moderate range but will not pursue further since he is asymptomatic and with his advanced age.

## 2017-06-20 ENCOUNTER — Encounter

## 2017-06-20 ENCOUNTER — Ambulatory Visit: Payer: Medicare Other | Admitting: Cardiology

## 2017-06-21 ENCOUNTER — Encounter: Payer: Self-pay | Admitting: *Deleted

## 2017-06-24 NOTE — Progress Notes (Signed)
Gregory [name] Date of Birth: March 03, 1920 Medical Record #[id number]  History of Present Illness: [name] is seen for cardiac follow up. He is a pleasant 84 yo WM with history of atrial flutter and pacemaker. He reports  his cardiac care had been at the [facility] clinic in [place]. He reports that he had significant bradycardia in 2002 and had a pacemaker placed (Medtronic). This was revised in 2010.  He has a history of atrial flutter and had a successful ablation in the past. He denies any history of CAD, MI, or CHF.   When  seen by me in 2016-04-11  he was found to be in atrial flutter and was tracking this at a high rate. He was started on Eliquis. Seen by Dr. [name] in February and pacemaker reprogrammed so he wouldn't track as fast. Attempted to pace terminate his Aflutter without success. When seen later in April he was found to be back in NSR.  More recently he has been staying more in [place]. 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   He denies any chest pain, dyspnea, or palpitations. No dizziness. Overall feels well.   Allergies as of 06/27/2017      Reactions   Penicillins Hives, Rash      Medication List        Accurate as of 06/27/17  2:16 PM. Always use your most recent med list.          CALCIUM PO Take 1 tablet by mouth daily.   cholecalciferol 1000 units tablet Commonly known as:  VITAMIN D Take 3,000 Units by mouth daily.   Coenzyme Q10 200 MG Tabs Take 200 mg by mouth daily.   docusate sodium 100 MG capsule Commonly known as:  COLACE Take 100 mg by mouth daily.   ELIQUIS 2.5 MG Tabs tablet Generic drug:  apixaban TAKE 1 TABLET (2.5 MG TOTAL) BY MOUTH 2 (TWO) TIMES DAILY.   levothyroxine 75 MCG tablet Commonly known as:  SYNTHROID, LEVOTHROID Take 75 mcg by mouth daily before breakfast.   MAGNESIUM PO Take 1-2  tablets by mouth daily.   NIACIN PO Take 1 tablet by mouth as directed.   POTASSIUM CHLORIDE PO Take 1 tablet by mouth daily.   vitamin B-12 1000 MCG tablet Commonly known as:  CYANOCOBALAMIN Take 1,000 mcg by mouth daily.   vitamin C 500 MG tablet Commonly known as:  ASCORBIC ACID Take 1,500 mg by mouth daily.        Allergies  Allergen Reactions  . Penicillins Hives and Rash    Past Medical History:  Diagnosis Date  . Atrial flutter ([place])    s/p ablation  . Cancer ([place])    skin cancer  . Cataracts, bilateral   . Hypertension   . Neuromuscular disorder ([place])   . Pacemaker 2010  . Thyroid disease     Past Surgical History:  Procedure Laterality Date  . APPENDECTOMY    . COLONOSCOPY    . EYE SURGERY Bilateral    cataract surgery  . HERNIA REPAIR Right   . INGUINAL HERNIA REPAIR Left 10/12/2013   Procedure: LEFT  INGUINAL HERNIA REPAIR;  Surgeon: Odis Hollingshead, MD;  Location: [place];  Service: General;  Laterality: Left;  . INSERTION OF MESH Left 10/12/2013   Procedure: INSERTION OF MESH;  Surgeon: Rhunette Croft  Rosenbower, MD;  Location: [facility];  Service: General;  Laterality: Left;    Social History   Socioeconomic History  . Marital status: Married    Spouse name: Not on file  . Number of children: 2  . Years of education: Not on file  . Highest education level: Not on file  Occupational History  . Occupation: retired Scientific laboratory technician Needs  . Financial resource strain: Not on file  . Food insecurity:    Worry: Not on file    Inability: Not on file  . Transportation needs:    Medical: Not on file    Non-medical: Not on file  Tobacco Use  . Smoking status: Never Smoker  . Smokeless tobacco: Never Used  Substance and Sexual Activity  . Alcohol use: No  . Drug use: No  . Sexual activity: Never  Lifestyle  . Physical activity:    Days per week: Not on file    Minutes per session: Not on file  . Stress: Not on file  Relationships  . Social  connections:    Talks on phone: Not on file    Gets together: Not on file    Attends religious service: Not on file    Active member of club or organization: Not on file    Attends meetings of clubs or organizations: Not on file    Relationship status: Not on file  Other Topics Concern  . Not on file  Social History Narrative  . Not on file    Family History  Problem Relation Age of Onset  . Heart disease Mother   . Cancer Father        leukemia    Review of Systems: As noted in HPI.  All other systems were reviewed and are negative.  Physical Exam: BP (!) 149/68   Pulse 81   Ht 5' 9.5" (1.765 m)   Wt 158 lb (71.7 kg)   BMI 23.00 kg/m  Filed Weights   06/27/17 1332  Weight: 158 lb (71.7 kg)  GENERAL:  Well appearing elderly WM in NAD HEENT:  PERRL, EOMI, sclera are clear. Oropharynx is clear. NECK:  No jugular venous distention, carotid upstroke brisk and symmetric, no bruits, no thyromegaly or adenopathy LUNGS:  Clear to auscultation bilaterally CHEST:  Unremarkable HEART:  RRR,  PMI not displaced or sustained,S1 and S2 within normal limits, no S3, no S4: no clicks, no rubs, harsh gr 9-8/3 systolic murmur RUSB radiating to the carotids and throughout the precordium. ABD:  Soft, nontender. BS +, no masses or bruits. No hepatomegaly, no splenomegaly EXT:  2 + pulses throughout, no edema, no cyanosis no clubbing SKIN:  Warm and dry.  No rashes NEURO:  Alert and oriented x 3. Cranial nerves II through XII intact. PSYCH:  Cognitively intact      LABORATORY DATA: Lab Results  Component Value Date   WBC 8.9 02/21/2016   HGB 10.4 (L) 02/21/2016   HCT 31.7 (L) 02/21/2016   PLT 229 02/21/2016   GLUCOSE 109 (H) 02/21/2016   ALT 13 02/21/2016   AST 20 02/21/2016   NA 135 02/21/2016   K 4.5 02/21/2016   CL 100 02/21/2016   CREATININE 1.26 (H) 02/21/2016   BUN 27 (H) 02/21/2016   CO2 27 02/21/2016   TSH 2.90 02/21/2016   INR 1.04 10/02/2013     Labs dated  04/19/15: cholesterol 153, triglycerides 50, LDL 87, HDL 56. Bun 29, creatinine 1.19. Other CMET normal. Hgb 10.6.  Dated 05/23/16: creatinine 1.12. Other chemistries normal. Dated 05/24/17: cholesterol 168, triglycerides 65, HDL 65, LDL 90. Hgb 10.6. Creatinine 1.27. Other chemistries and TSH normal.    Assessment / Plan: 1. History of bradycardia s/p permanent pacemaker 2. History of atrial flutter s/p ablation. Had recurrent Atrial flutter last year.  Later converted back to NSR. He is asymptomatic. [place] Vasc score of 2 based on age. No history of bleeding or CVA. I have recommended continuing  Eliquis 2.5 mg  Bid. 3. History of aortic stenosis. Exam suggest this is more in the moderate range but will not pursue further since he is asymptomatic and with his advanced age.  I will follow up in 6 months.

## 2017-06-27 ENCOUNTER — Ambulatory Visit (INDEPENDENT_AMBULATORY_CARE_PROVIDER_SITE_OTHER): Payer: Medicare Other | Admitting: Cardiology

## 2017-06-27 ENCOUNTER — Encounter: Payer: Self-pay | Admitting: Cardiology

## 2017-06-27 VITALS — BP 149/68 | HR 81 | Ht 69.5 in | Wt 158.0 lb

## 2017-06-27 DIAGNOSIS — I350 Nonrheumatic aortic (valve) stenosis: Secondary | ICD-10-CM | POA: Diagnosis not present

## 2017-06-27 DIAGNOSIS — Z950 Presence of cardiac pacemaker: Secondary | ICD-10-CM | POA: Diagnosis not present

## 2017-06-27 DIAGNOSIS — I442 Atrioventricular block, complete: Secondary | ICD-10-CM | POA: Diagnosis not present

## 2017-06-27 DIAGNOSIS — I4892 Unspecified atrial flutter: Secondary | ICD-10-CM

## 2017-06-27 MED ORDER — APIXABAN 2.5 MG PO TABS: 2.5000 mg | ORAL_TABLET | Freq: Two times a day (BID) | ORAL | 3 refills | Status: DC

## 2017-06-27 NOTE — Patient Instructions (Addendum)
Continue your current therapy  I will see you in 6 months.   

## 2017-07-16 DIAGNOSIS — Z008 Encounter for other general examination: Secondary | ICD-10-CM | POA: Diagnosis not present

## 2017-08-29 ENCOUNTER — Encounter: Payer: Self-pay | Admitting: Cardiology

## 2017-10-07 DIAGNOSIS — Z45010 Encounter for checking and testing of cardiac pacemaker pulse generator [battery]: Secondary | ICD-10-CM | POA: Diagnosis not present

## 2017-10-07 DIAGNOSIS — Z950 Presence of cardiac pacemaker: Secondary | ICD-10-CM | POA: Diagnosis not present

## 2017-10-07 DIAGNOSIS — I442 Atrioventricular block, complete: Secondary | ICD-10-CM | POA: Diagnosis not present

## 2017-11-13 ENCOUNTER — Encounter: Payer: Medicare Other | Admitting: Internal Medicine

## 2017-11-13 ENCOUNTER — Encounter: Payer: Self-pay | Admitting: Internal Medicine

## 2017-11-13 ENCOUNTER — Ambulatory Visit (INDEPENDENT_AMBULATORY_CARE_PROVIDER_SITE_OTHER): Payer: Medicare Other | Admitting: Internal Medicine

## 2017-11-13 VITALS — BP 134/70 | HR 62 | Ht 69.0 in | Wt 147.0 lb

## 2017-11-13 DIAGNOSIS — I442 Atrioventricular block, complete: Secondary | ICD-10-CM | POA: Diagnosis not present

## 2017-11-13 DIAGNOSIS — Z950 Presence of cardiac pacemaker: Secondary | ICD-10-CM

## 2017-11-13 DIAGNOSIS — I4892 Unspecified atrial flutter: Secondary | ICD-10-CM | POA: Diagnosis not present

## 2017-11-13 DIAGNOSIS — I4589 Other specified conduction disorders: Secondary | ICD-10-CM | POA: Diagnosis not present

## 2017-11-13 NOTE — Patient Instructions (Addendum)
Medication Instructions:  Your physician recommends that you continue on your current medications as directed. Please refer to the Current Medication list given to you today.  Labwork: None ordered.  Testing/Procedures: None ordered.  Follow-Up: Your physician wants you to follow-up in: 6 months with Chanetta Marshall, NP.   You will receive a reminder letter in the mail two months in advance. If you don't receive a letter, please call our office to schedule the follow-up appointment.  Remote monitoring is used to monitor your Pacemaker from home. This monitoring reduces the number of office visits required to check your device to one time per year. It allows [place] to keep an eye on the functioning of your device to ensure it is working properly. You are scheduled for a device check from home on February 13, 2017. You may send your transmission at any time that day. If you have a wireless device, the transmission will be sent automatically. After your physician reviews your transmission, you will receive a postcard with your next transmission date.  Any Other Special Instructions Will Be Listed Below (If Applicable).  If you need a refill on your cardiac medications before your next appointment, please call your pharmacy.

## 2017-11-13 NOTE — Progress Notes (Signed)
PCP: Lanice Shirts, MD Primary Cardiologist: Gregory Gregory [name] Primary EP:  Gregory [name]  Gregory [name] is a 84 y.o. male who presents today for routine electrophysiology followup.  Since last being seen in our clinic, the patient reports doing very well.  Today, he denies symptoms of palpitations, chest pain, shortness of breath,  lower extremity edema, dizziness, presyncope, or syncope.  The patient is otherwise without complaint today.   Past Medical History:  Diagnosis Date  . Atrial flutter ([place])    s/p ablation  . Cancer ([place])    skin cancer  . Cataracts, bilateral   . Hypertension   . Neuromuscular disorder ([place])   . Pacemaker 2010  . Thyroid disease    Past Surgical History:  Procedure Laterality Date  . APPENDECTOMY    . COLONOSCOPY    . EYE SURGERY Bilateral    cataract surgery  . HERNIA REPAIR Right   . INGUINAL HERNIA REPAIR Left 10/12/2013   Procedure: LEFT  INGUINAL HERNIA REPAIR;  Surgeon: Odis Hollingshead, MD;  Location: [facility];  Service: General;  Laterality: Left;  . INSERTION OF MESH Left 10/12/2013   Procedure: INSERTION OF MESH;  Surgeon: Odis Hollingshead, MD;  Location: [place];  Service: General;  Laterality: Left;    ROS- all systems are reviewed and negative except as per HPI above  Current Outpatient Medications  Medication Sig Dispense Refill  . apixaban (ELIQUIS) 2.5 MG TABS tablet Take 1 tablet (2.5 mg total) by mouth 2 (two) times daily. 180 tablet 3  . CALCIUM PO Take 1 tablet by mouth daily.     . cholecalciferol (VITAMIN D) 1000 UNITS tablet Take 3,000 Units by mouth daily.     . Coenzyme Q10 200 MG TABS Take 200 mg by mouth daily.     Marland Kitchen docusate sodium (COLACE) 100 MG capsule Take 100 mg by mouth daily.     Marland Kitchen levothyroxine (SYNTHROID, LEVOTHROID) 75 MCG tablet Take 75 mcg by mouth daily before breakfast.    . MAGNESIUM PO Take 1-2 tablets by mouth daily.     Marland Kitchen NIACIN PO Take 1 tablet by mouth as directed.     Marland Kitchen POTASSIUM CHLORIDE PO Take 1  tablet by mouth daily.    . vitamin B-12 (CYANOCOBALAMIN) 1000 MCG tablet Take 1,000 mcg by mouth daily.     . vitamin C (ASCORBIC ACID) 500 MG tablet Take 1,500 mg by mouth daily.      Current Facility-Administered Medications  Medication Dose Route Frequency Provider Last Rate Last Dose  . apixaban (ELIQUIS) tablet 2.5 mg  2.5 mg Oral BID Gregory [name], Gregory M, MD        Physical Exam: Vitals:   11/13/17 1514  BP: 134/70  Pulse: 62  SpO2: 99%  Weight: 147 lb (66.7 kg)  Height: 5\' 9"  (1.753 [name])     GEN- The patient is well appearing, alert and oriented x 3 today.   Head- normocephalic, atraumatic Eyes-  Sclera clear, conjunctiva pink Ears- hearing intact Oropharynx- clear Lungs- Clear to ausculation bilaterally, normal work of breathing Chest- pacemaker pocket is well healed Heart- Regular rate and rhythm, no murmurs, rubs or gallops, PMI not laterally displaced GI- soft, NT, ND, + BS Extremities- no clubbing, cyanosis, or edema  Pacemaker interrogation- reviewed in detail today,  See PACEART report  ekg tracing ordered today is personally reviewed and shows AV paced rhythm  Assessment and Plan:  1. Symptomatic sick sinus syndrome and complete heart block  Normal pacemaker function See Pace Art report No changes today  2. Atypical atrial flutter (on ekg 03/19/16) asymptomatic  chads2vasc score is 3.  Continue eliquis  carelink Return to see EP NP in 6 months He has <1-27 months (estimated longevity), likely 14 months remaining. The importance of compliance with remotes was stressed with patient and wife today.  As I am worried about his ability to comply, I will also schedule follow-up with EP NP in 6 months  Thompson Grayer MD, [facility] 11/13/2017 4:08 PM

## 2017-11-15 DIAGNOSIS — I10 Essential (primary) hypertension: Secondary | ICD-10-CM | POA: Diagnosis not present

## 2017-11-15 DIAGNOSIS — E039 Hypothyroidism, unspecified: Secondary | ICD-10-CM | POA: Diagnosis not present

## 2017-11-15 DIAGNOSIS — R634 Abnormal weight loss: Secondary | ICD-10-CM | POA: Diagnosis not present

## 2017-11-15 DIAGNOSIS — K4090 Unilateral inguinal hernia, without obstruction or gangrene, not specified as recurrent: Secondary | ICD-10-CM | POA: Diagnosis not present

## 2017-11-15 DIAGNOSIS — Z950 Presence of cardiac pacemaker: Secondary | ICD-10-CM | POA: Diagnosis not present

## 2017-11-15 DIAGNOSIS — Z114 Encounter for screening for human immunodeficiency virus [HIV]: Secondary | ICD-10-CM | POA: Diagnosis not present

## 2017-11-24 DIAGNOSIS — Z23 Encounter for immunization: Secondary | ICD-10-CM | POA: Diagnosis not present

## 2017-12-12 DIAGNOSIS — Z23 Encounter for immunization: Secondary | ICD-10-CM | POA: Diagnosis not present

## 2017-12-13 DIAGNOSIS — R634 Abnormal weight loss: Secondary | ICD-10-CM | POA: Diagnosis not present

## 2017-12-13 DIAGNOSIS — J3489 Other specified disorders of nose and nasal sinuses: Secondary | ICD-10-CM | POA: Diagnosis not present

## 2017-12-13 DIAGNOSIS — I350 Nonrheumatic aortic (valve) stenosis: Secondary | ICD-10-CM | POA: Diagnosis not present

## 2017-12-13 DIAGNOSIS — I4892 Unspecified atrial flutter: Secondary | ICD-10-CM | POA: Diagnosis not present

## 2017-12-13 DIAGNOSIS — Z23 Encounter for immunization: Secondary | ICD-10-CM | POA: Diagnosis not present

## 2017-12-19 LAB — CUP PACEART INCLINIC DEVICE CHECK
Battery Impedance: 4156 Ohm
Battery Remaining Longevity: 14 mo
Battery Voltage: 2.69 V
Brady Statistic AS VP Percent: 3 %
Date Time Interrogation Session: 20191002184301
Implantable Lead Implant Date: 20100108
Implantable Lead Location: 753859
Implantable Lead Model: 5568
Lead Channel Pacing Threshold Amplitude: 1 V
Lead Channel Setting Pacing Amplitude: 2.5 V
Lead Channel Setting Pacing Pulse Width: 0.4 ms
Lead Channel Setting Sensing Sensitivity: 2.8 mV
MDC IDC LEAD IMPLANT DT: 20021114
MDC IDC LEAD LOCATION: 753860
MDC IDC MSMT LEADCHNL RA IMPEDANCE VALUE: 776 Ohm
MDC IDC MSMT LEADCHNL RA PACING THRESHOLD AMPLITUDE: 0.5 V
MDC IDC MSMT LEADCHNL RA PACING THRESHOLD PULSEWIDTH: 0.4 ms
MDC IDC MSMT LEADCHNL RV IMPEDANCE VALUE: 544 Ohm
MDC IDC MSMT LEADCHNL RV PACING THRESHOLD PULSEWIDTH: 0.4 ms
MDC IDC PG IMPLANT DT: 20100108
MDC IDC SET LEADCHNL RA PACING AMPLITUDE: 1.5 V
MDC IDC STAT BRADY AP VP PERCENT: 97 %
MDC IDC STAT BRADY AP VS PERCENT: 0 %
MDC IDC STAT BRADY AS VS PERCENT: 0 %

## 2017-12-20 ENCOUNTER — Telehealth: Payer: Self-pay | Admitting: *Deleted

## 2017-12-20 NOTE — Telephone Encounter (Signed)
LMOVM for [facility] device clinic staff requesting call back, gave direct number. Will request that patient be released in [place].

## 2017-12-20 NOTE — Telephone Encounter (Signed)
[facility] and she stated that she will release pt.

## 2017-12-30 DIAGNOSIS — L570 Actinic keratosis: Secondary | ICD-10-CM | POA: Diagnosis not present

## 2018-01-21 NOTE — Progress Notes (Signed)
Gregory [name] Date of Birth: 1920-12-08 Medical Record #[id number]  History of Present Illness: [name] is seen for cardiac follow up. He is a pleasant 84 yo WM with history of atrial flutter and pacemaker. He reports  his cardiac care had been at the [facility] clinic in [place]. He reports that he had significant bradycardia in 2002 and had a pacemaker placed ([employer]tr[employer]). This was revised in 2010.  He has a history of atrial flutter and had a successful ablation in the past. He denies any history of CAD, MI, or CHF.   When  seen by me in January 2018  he was found to be in atrial flutter and was tracking this at a high rate. He was started on Eliquis. Seen by Dr. [name] and pacemaker reprogrammed so he wouldn't track as fast. Attempted to pace terminate his Aflutter without success. When seen later in April he was found to be back in NSR. Seen in October by Dr. [name] and pacemaker functioning OK and he was in NSR.  He is now staying in [place] primarily. 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 He is always on the go.    He denies any chest pain, dyspnea, or palpitations. No dizziness. Overall feels well.   Allergies as of 01/23/2018      Reactions   Penicillins Hives, Rash      Medication List       Accurate as of January 23, 2018  4:44 PM. Always use your most recent med list.        apixaban 2.5 MG Tabs tablet Commonly known as:  ELIQUIS Take 1 tablet (2.5 mg total) by mouth 2 (two) times daily.   CALCIUM PO Take 1 tablet by mouth daily.   cholecalciferol 1000 units tablet Commonly known as:  VITAMIN D Take 3,000 Units by mouth daily.   Coenzyme Q10 200 MG Tabs Take 200 mg by mouth daily.   levothyroxine 75 MCG tablet Commonly known as:  SYNTHROID, LEVOTHROID Take 75 mcg by mouth daily before breakfast.   MAGNESIUM PO Take 1-2 tablets by mouth daily.   NIACIN PO Take 1 tablet by mouth as directed.     POTASSIUM CHLORIDE PO Take 1 tablet by mouth daily.   vitamin B-12 1000 MCG tablet Commonly known as:  CYANOCOBALAMIN Take 1,000 mcg by mouth daily.   vitamin C 500 MG tablet Commonly known as:  ASCORBIC ACID Take 1,500 mg by mouth daily.        Allergies  Allergen Reactions  . Penicillins Hives and Rash    Past Medical History:  Diagnosis Date  . Atrial flutter ([place])    s/p ablation  . Cancer ([place])    skin cancer  . Cataracts, bilateral   . Hypertension   . Neuromuscular disorder ([place])   . Pacemaker 2010  . Thyroid disease     Past Surgical History:  Procedure Laterality Date  . APPENDECTOMY    . COLONOSCOPY    . EYE SURGERY Bilateral    cataract surgery  . HERNIA REPAIR Right   . INGUINAL HERNIA REPAIR Left 10/12/2013   Procedure: LEFT  INGUINAL HERNIA REPAIR;  Surgeon: Odis Hollingshead, MD;  Location: [place];  Service: General;  Laterality: Left;  . INSERTION OF MESH Left 10/12/2013   Procedure: INSERTION OF MESH;  Surgeon: Odis Hollingshead, MD;  Location: [place];  Service: General;  Laterality: Left;  Social History   Socioeconomic History  . Marital status: Married    Spouse name: Not on file  . Number of children: 2  . Years of education: Not on file  . Highest education level: Not on file  Occupational History  . Occupation: retired Scientific laboratory technician Needs  . Financial resource strain: Not on file  . Food insecurity:    Worry: Not on file    Inability: Not on file  . Transportation needs:    Medical: Not on file    Non-medical: Not on file  Tobacco Use  . Smoking status: Never Smoker  . Smokeless tobacco: Never Used  Substance and Sexual Activity  . Alcohol use: No  . Drug use: No  . Sexual activity: Never  Lifestyle  . Physical activity:    Days per week: Not on file    Minutes per session: Not on file  . Stress: Not on file  Relationships  . Social connections:    Talks on phone: Not on file    Gets together: Not on file     Attends religious service: Not on file    Active member of club or organization: Not on file    Attends meetings of clubs or organizations: Not on file    Relationship status: Not on file  Other Topics Concern  . Not on file  Social History Narrative  . Not on file    Family History  Problem Relation Age of Onset  . Heart disease Mother   . Cancer Father        leukemia    Review of Systems: As noted in HPI.  All other systems were reviewed and are negative.  Physical Exam: BP (!) 150/62   Pulse 69   Ht 5\' 9"  (1.753 m)   Wt 152 lb (68.9 kg)   BMI 22.45 kg/m  Filed Weights   01/23/18 1603  Weight: 152 lb (68.9 kg)    GENERAL:  Well appearing, elderly WM in NAD HEENT:  PERRL, EOMI, sclera are clear. Oropharynx is clear. NECK:  No jugular venous distention, carotid upstroke brisk and symmetric, no bruits, no thyromegaly or adenopathy LUNGS:  Clear to auscultation bilaterally CHEST:  Unremarkable HEART:  RRR,  PMI not displaced or sustained,S1 and S2 within normal limits, no S3, no S4: no clicks, no rubs, gr 3/6 harsh systolic murmur at the apex.  ABD:  Soft, nontender. BS +, no masses or bruits. No hepatomegaly, no splenomegaly EXT:  2 + pulses throughout, no edema, no cyanosis no clubbing SKIN:  Warm and dry.  No rashes NEURO:  Alert and oriented x 3. Cranial nerves II through XII intact. PSYCH:  Cognitively intact    LABORATORY DATA: Lab Results  Component Value Date   WBC 8.9 02/21/2016   HGB 10.4 (L) 02/21/2016   HCT 31.7 (L) 02/21/2016   PLT 229 02/21/2016   GLUCOSE 109 (H) 02/21/2016   ALT 13 02/21/2016   AST 20 02/21/2016   NA 135 02/21/2016   K 4.5 02/21/2016   CL 100 02/21/2016   CREATININE 1.26 (H) 02/21/2016   BUN 27 (H) 02/21/2016   CO2 27 02/21/2016   TSH 2.90 02/21/2016   INR 1.04 10/02/2013     Labs dated 04/19/15: cholesterol 153, triglycerides 50, LDL 87, HDL 56. Bun 29, creatinine 1.19. Other CMET normal. Hgb 10.6. Dated 05/23/16:  creatinine 1.12. Other chemistries normal. Dated 05/24/17: cholesterol 168, triglycerides 65, HDL 65, LDL 90. Hgb 10.6. Creatinine  1.27. Other chemistries and TSH normal.  Dated 11/15/17: Hgb 10.2. CMET normal. Folate and B12 normal. TSH normal.  Assessment / Plan: 1. History of bradycardia s/p permanent pacemaker. Good pacemaker function in October.  2. History of atrial flutter s/p ablation. Had recurrent Atrial flutter last year.  Later converted back to NSR. He is asymptomatic. [place] Vasc score of 2 based on age. No history of bleeding or CVA. I have recommended continuing  Eliquis 2.5 mg  Bid. 3. History of aortic stenosis. Mild by Echo in 2015. Loud apical murmur now. He is asymptomatic so we will not pursue further evaluation at his advanced age.    I will follow up in 6 months.

## 2018-01-23 ENCOUNTER — Ambulatory Visit (INDEPENDENT_AMBULATORY_CARE_PROVIDER_SITE_OTHER): Payer: Medicare Other | Admitting: Cardiology

## 2018-01-23 ENCOUNTER — Encounter: Payer: Self-pay | Admitting: Cardiology

## 2018-01-23 VITALS — BP 150/62 | HR 69 | Ht 69.0 in | Wt 152.0 lb

## 2018-01-23 DIAGNOSIS — Z950 Presence of cardiac pacemaker: Secondary | ICD-10-CM | POA: Diagnosis not present

## 2018-01-23 DIAGNOSIS — I350 Nonrheumatic aortic (valve) stenosis: Secondary | ICD-10-CM

## 2018-01-23 DIAGNOSIS — I442 Atrioventricular block, complete: Secondary | ICD-10-CM | POA: Diagnosis not present

## 2018-01-23 DIAGNOSIS — I4892 Unspecified atrial flutter: Secondary | ICD-10-CM

## 2018-02-14 ENCOUNTER — Encounter: Payer: Self-pay | Admitting: Cardiology

## 2018-02-28 DIAGNOSIS — J209 Acute bronchitis, unspecified: Secondary | ICD-10-CM | POA: Diagnosis not present

## 2018-03-12 DIAGNOSIS — L570 Actinic keratosis: Secondary | ICD-10-CM | POA: Diagnosis not present

## 2018-03-12 DIAGNOSIS — D485 Neoplasm of uncertain behavior of skin: Secondary | ICD-10-CM | POA: Diagnosis not present

## 2018-06-02 ENCOUNTER — Telehealth: Payer: Self-pay | Admitting: Cardiology

## 2018-06-02 NOTE — Telephone Encounter (Signed)
-----   Message from [name], RN sent at 06/02/2018  9:28 AM EDT ----- Regarding: looks like [facility] was supposed to release his remotes to [place]? No remotes received yet.  Pt has appt with Dr. [name] this Wednesday.  Gregory [name]

## 2018-06-02 NOTE — Telephone Encounter (Signed)
LMOVM for pt to return call. Need a manual transmission w/ home monitor. Carelink [id number] monitor.

## 2018-06-03 ENCOUNTER — Telehealth: Payer: Self-pay

## 2018-06-03 NOTE — Telephone Encounter (Signed)
Left a message regarding appt on 06/04/18.

## 2018-06-03 NOTE — Telephone Encounter (Signed)
LMOVM for pt to return call 

## 2018-06-04 ENCOUNTER — Ambulatory Visit (INDEPENDENT_AMBULATORY_CARE_PROVIDER_SITE_OTHER): Payer: Medicare Other | Admitting: *Deleted

## 2018-06-04 ENCOUNTER — Other Ambulatory Visit: Payer: Self-pay

## 2018-06-04 ENCOUNTER — Encounter: Payer: Medicare Other | Admitting: Internal Medicine

## 2018-06-04 ENCOUNTER — Telehealth (INDEPENDENT_AMBULATORY_CARE_PROVIDER_SITE_OTHER): Payer: Medicare Other | Admitting: Internal Medicine

## 2018-06-04 DIAGNOSIS — I442 Atrioventricular block, complete: Secondary | ICD-10-CM | POA: Diagnosis not present

## 2018-06-04 DIAGNOSIS — I4892 Unspecified atrial flutter: Secondary | ICD-10-CM

## 2018-06-04 NOTE — Telephone Encounter (Signed)
Spoke with pt daughter regarding appt on 06/04/18. Pt daughter stated pt can not upload EKG or check vitals. Pt questions and concerns were address.

## 2018-06-04 NOTE — Progress Notes (Signed)
Electrophysiology TeleHealth Note   Due to national recommendations of social distancing due to COVID 19, an audio/video telehealth visit is felt to be most appropriate for this patient at this time.  See MyChart message from today for the patient's consent to telehealth for [facility].   Date:  06/04/2018   ID:  Gregory [name], DOB 23-Mar-1920, MRN [id number]  Location: patient's home  Provider location: [address], [place]  Evaluation Performed: Follow-up visit  PCP:  Lanice Shirts, MD  Cardiologist:  Dr Martinique Electrophysiologist:  Dr [name]  Chief Complaint:  Atrial flutter  History of Present Illness:    Gregory [name] is a 85 y.o. male who presents via audio/video conferencing for a telehealth visit today.  Since last being seen in our clinic, the patient reports doing very well.  He has no complaints today.  Today, he denies symptoms of palpitations, chest pain, shortness of breath,  lower extremity edema, dizziness, presyncope, or syncope.  The patient is otherwise without complaint today.  The patient denies symptoms of fevers, chills, cough, or new SOB worrisome for COVID 19.  Past Medical History:  Diagnosis Date  . Atrial flutter ([place])    s/p ablation  . Cancer ([place])    skin cancer  . Cataracts, bilateral   . Hypertension   . Neuromuscular disorder ([place])   . Pacemaker 2010  . Thyroid disease     Past Surgical History:  Procedure Laterality Date  . APPENDECTOMY    . COLONOSCOPY    . EYE SURGERY Bilateral    cataract surgery  . HERNIA REPAIR Right   . INGUINAL HERNIA REPAIR Left 10/12/2013   Procedure: LEFT  INGUINAL HERNIA REPAIR;  Surgeon: Odis Hollingshead, MD;  Location: [place];  Service: General;  Laterality: Left;  . INSERTION OF MESH Left 10/12/2013   Procedure: INSERTION OF MESH;  Surgeon: Odis Hollingshead, MD;  Location: [place];  Service: General;  Laterality: Left;    Current Outpatient Medications  Medication Sig  Dispense Refill  . apixaban (ELIQUIS) 2.5 MG TABS tablet Take 1 tablet (2.5 mg total) by mouth 2 (two) times daily. 180 tablet 3  . CALCIUM PO Take 1 tablet by mouth daily.     . cholecalciferol (VITAMIN D) 1000 UNITS tablet Take 3,000 Units by mouth daily.     . Coenzyme Q10 200 MG TABS Take 200 mg by mouth daily.     Marland Kitchen levothyroxine (SYNTHROID, LEVOTHROID) 75 MCG tablet Take 75 mcg by mouth daily before breakfast.    . MAGNESIUM PO Take 1-2 tablets by mouth daily.     Marland Kitchen POTASSIUM CHLORIDE PO Take 1 tablet by mouth daily.    . vitamin B-12 (CYANOCOBALAMIN) 1000 MCG tablet Take 1,000 mcg by mouth daily.     . vitamin C (ASCORBIC ACID) 500 MG tablet Take 1,500 mg by mouth daily.      Current Facility-Administered Medications  Medication Dose Route Frequency Provider Last Rate Last Dose  . apixaban (ELIQUIS) tablet 2.5 mg  2.5 mg Oral BID Martinique, Peter M, MD        Allergies:   Penicillins   Social History:  The patient  reports that he has never smoked. He has never used smokeless tobacco. He reports that he does not drink alcohol or use drugs.   Family History:  The patient's  family history includes Cancer in his father; Heart disease in his mother.   ROS:  Please see the  history of present illness.   All other systems are personally reviewed and negative.    Exam:    Vital Signs:  There were no vitals taken for this visit.  Well appearing, alert and conversant, regular work of breathing,  good skin color Eyes- anicteric, neuro- grossly intact, skin- no apparent rash or lesions or cyanosis, mouth- oral mucosa is pink   Appears to have some trouble with hearing.  Also has difficulty with cognitive processing and understanding what I am discussing with him,  Caregiver was helpful in the call.   Labs/Other Tests and Data Reviewed:    Recent Labs: No results found for requested labs within last 8760 hours.   Wt Readings from Last 3 Encounters:  01/23/18 152 lb (68.9 kg)   11/13/17 147 lb (66.7 kg)  06/27/17 158 lb (71.7 kg)     Other studies personally reviewed: Additional studies/ records that were reviewed today include: my prior office notes,  Dr Doug Sou notes  Review of the above records today demonstrates: as above   Last device remote is reviewed from [place] PDF dated today which reveals normal device function, no arrhythmias,  [facility] is at [facility].   ASSESSMENT & PLAN:    1.  Sick sinus, complete heart block Remote from today reveals that he has reached ERI.  He is dependant.  He has significant difficulty understanding what I am trying to tell him today. I worry that he would not do well to wait a month or two for generator change with COVID.  I think that the appropriate thing for him at this time is to bring him in within the next few weeks for generator change. Will hold eliquis prior Obtain labs on morning of the procedure to avoid multiple exposures  2. Atypical atrial flutter Well controlled Hold eliquis 24 hours prior to the procedure  3. COVID 19 screen The patient denies symptoms of COVID 19 at this time.  The importance of social distancing was discussed today.   Patient Risk:  after full review of this patients clinical status, I feel that they are at moderate to high risk at this time.  Today, I have spent 25 minutes with the patient with telehealth technology discussing pacemaker generator change .    SignedThompson Grayer, MD  06/04/2018 3:59 PM     [place] [address] [phone] (office) [phone] (fax)

## 2018-06-04 NOTE — Telephone Encounter (Signed)
Spoke w/ pt and his daughter. Instructed them how to send a manual transmission w/ the home monitor. Transmission w/ the home monitor received.

## 2018-06-05 LAB — CUP PACEART REMOTE DEVICE CHECK
Battery Impedance: 8795 Ohm
Battery Voltage: 2.61 V
Brady Statistic RV Percent Paced: 100 %
Date Time Interrogation Session: 20200422180845
Implantable Lead Implant Date: 20021114
Implantable Lead Implant Date: 20100108
Implantable Lead Location: 753859
Implantable Lead Location: 753860
Implantable Lead Model: 5076
Implantable Lead Model: 5568
Implantable Pulse Generator Implant Date: 20100108
Lead Channel Impedance Value: 550 Ohm
Lead Channel Impedance Value: 67 Ohm
Lead Channel Setting Pacing Amplitude: 2.5 V
Lead Channel Setting Pacing Pulse Width: 0.4 ms
Lead Channel Setting Sensing Sensitivity: 2.8 mV

## 2018-06-12 ENCOUNTER — Encounter: Payer: Self-pay | Admitting: Cardiology

## 2018-06-12 NOTE — Progress Notes (Signed)
Remote pacemaker transmission.   

## 2018-06-17 DIAGNOSIS — Z950 Presence of cardiac pacemaker: Secondary | ICD-10-CM | POA: Diagnosis not present

## 2018-06-17 DIAGNOSIS — Z1159 Encounter for screening for other viral diseases: Secondary | ICD-10-CM | POA: Diagnosis not present

## 2018-06-17 DIAGNOSIS — I442 Atrioventricular block, complete: Secondary | ICD-10-CM | POA: Diagnosis not present

## 2018-06-18 DIAGNOSIS — I442 Atrioventricular block, complete: Secondary | ICD-10-CM | POA: Diagnosis not present

## 2018-06-18 DIAGNOSIS — R9431 Abnormal electrocardiogram [ECG] [EKG]: Secondary | ICD-10-CM | POA: Diagnosis not present

## 2018-06-18 DIAGNOSIS — I350 Nonrheumatic aortic (valve) stenosis: Secondary | ICD-10-CM | POA: Diagnosis not present

## 2018-06-18 DIAGNOSIS — I4821 Permanent atrial fibrillation: Secondary | ICD-10-CM | POA: Diagnosis not present

## 2018-06-18 DIAGNOSIS — Z45010 Encounter for checking and testing of cardiac pacemaker pulse generator [battery]: Secondary | ICD-10-CM | POA: Diagnosis not present

## 2018-06-18 DIAGNOSIS — I482 Chronic atrial fibrillation, unspecified: Secondary | ICD-10-CM | POA: Diagnosis not present

## 2018-06-18 DIAGNOSIS — R011 Cardiac murmur, unspecified: Secondary | ICD-10-CM | POA: Diagnosis not present

## 2018-06-18 DIAGNOSIS — Z950 Presence of cardiac pacemaker: Secondary | ICD-10-CM | POA: Diagnosis not present

## 2018-06-18 DIAGNOSIS — H9193 Unspecified hearing loss, bilateral: Secondary | ICD-10-CM | POA: Insufficient documentation

## 2018-06-18 DIAGNOSIS — I10 Essential (primary) hypertension: Secondary | ICD-10-CM | POA: Diagnosis not present

## 2018-06-18 DIAGNOSIS — Z45018 Encounter for adjustment and management of other part of cardiac pacemaker: Secondary | ICD-10-CM | POA: Diagnosis not present

## 2018-06-19 ENCOUNTER — Telehealth: Payer: Self-pay

## 2018-06-25 DIAGNOSIS — Z45010 Encounter for checking and testing of cardiac pacemaker pulse generator [battery]: Secondary | ICD-10-CM | POA: Diagnosis not present

## 2018-06-25 DIAGNOSIS — I442 Atrioventricular block, complete: Secondary | ICD-10-CM | POA: Diagnosis not present

## 2018-06-25 DIAGNOSIS — Z950 Presence of cardiac pacemaker: Secondary | ICD-10-CM | POA: Diagnosis not present

## 2018-06-25 NOTE — Telephone Encounter (Signed)
Pt had generator changed in [place].   Documentation received from [facility] clinic.

## 2018-07-23 DIAGNOSIS — Z950 Presence of cardiac pacemaker: Secondary | ICD-10-CM | POA: Diagnosis not present

## 2018-07-23 DIAGNOSIS — Z45010 Encounter for checking and testing of cardiac pacemaker pulse generator [battery]: Secondary | ICD-10-CM | POA: Diagnosis not present

## 2018-07-23 DIAGNOSIS — I442 Atrioventricular block, complete: Secondary | ICD-10-CM | POA: Diagnosis not present

## 2018-08-20 DIAGNOSIS — I080 Rheumatic disorders of both mitral and aortic valves: Secondary | ICD-10-CM | POA: Diagnosis not present

## 2018-08-20 DIAGNOSIS — I517 Cardiomegaly: Secondary | ICD-10-CM | POA: Diagnosis not present

## 2018-08-20 DIAGNOSIS — Z01810 Encounter for preprocedural cardiovascular examination: Secondary | ICD-10-CM | POA: Diagnosis not present

## 2018-08-20 DIAGNOSIS — I350 Nonrheumatic aortic (valve) stenosis: Secondary | ICD-10-CM | POA: Diagnosis not present

## 2018-09-02 ENCOUNTER — Other Ambulatory Visit: Payer: Self-pay

## 2018-09-03 NOTE — Telephone Encounter (Signed)
PT labs are overdue by 2 years called and lmomed the pt regarding coming in for a metabolic panel and awaiting callback

## 2018-09-05 ENCOUNTER — Other Ambulatory Visit: Payer: Self-pay | Admitting: *Deleted

## 2018-09-05 ENCOUNTER — Telehealth: Payer: Self-pay | Admitting: *Deleted

## 2018-09-05 DIAGNOSIS — Z0000 Encounter for general adult medical examination without abnormal findings: Secondary | ICD-10-CM | POA: Diagnosis not present

## 2018-09-05 DIAGNOSIS — I4892 Unspecified atrial flutter: Secondary | ICD-10-CM | POA: Diagnosis not present

## 2018-09-05 DIAGNOSIS — E039 Hypothyroidism, unspecified: Secondary | ICD-10-CM | POA: Diagnosis not present

## 2018-09-05 DIAGNOSIS — Z23 Encounter for immunization: Secondary | ICD-10-CM | POA: Diagnosis not present

## 2018-09-05 DIAGNOSIS — E785 Hyperlipidemia, unspecified: Secondary | ICD-10-CM | POA: Diagnosis not present

## 2018-09-05 DIAGNOSIS — I350 Nonrheumatic aortic (valve) stenosis: Secondary | ICD-10-CM | POA: Diagnosis not present

## 2018-09-05 MED ORDER — APIXABAN 2.5 MG PO TABS: 2.5000 mg | ORAL_TABLET | Freq: Two times a day (BID) | ORAL | 1 refills | Status: DC

## 2018-09-05 NOTE — Telephone Encounter (Signed)
-----   Message from Peter M Martinique, MD sent at 09/05/2018 12:09 PM EDT ----- Regarding: RE: Eliquis Dosage I would favor leaving him where he is.   Peter Martinique MD, [facility]  ----- Message ----- From: Zenovia Jarred, RN Sent: 09/05/2018   8:30 AM EDT To: Thompson Grayer, MD, Peter M Martinique, MD Subject: Eliquis Dosage                                 Good morning,   Above patient is currently on 2.5mg  of Eliquis. He is 85 yrs old, last weight at your office visit on 01/23/18 with Dr. Martinique was 68.9 Kg. SCr on 11/15/17 per Care Everywhere was 1.1 at [facility]. Should we increase him to 5mg  BID or continue current 2.5mg  BID.

## 2018-09-05 NOTE — Telephone Encounter (Signed)
Pt is a 85 year old male who saw Dr. Martinique on 01/23/18, weight at that visit was 68.9Kg. SCr on 11/15/17 via Care Everywhere was 1.1. Will send note to Dr. Martinique if we are to continue current 2.5mg  or increase to 5mg .  Per Dr. Doug Sou response he favors leaving pt where he is. Thus will refill 2.5mg  BID.

## 2018-09-05 NOTE — Telephone Encounter (Signed)
Will refill Eliquis 2.5mg  BID.

## 2018-09-15 DIAGNOSIS — I350 Nonrheumatic aortic (valve) stenosis: Secondary | ICD-10-CM | POA: Diagnosis not present

## 2018-09-15 DIAGNOSIS — Z01810 Encounter for preprocedural cardiovascular examination: Secondary | ICD-10-CM | POA: Diagnosis not present

## 2018-09-16 DIAGNOSIS — Z01810 Encounter for preprocedural cardiovascular examination: Secondary | ICD-10-CM | POA: Diagnosis not present

## 2018-09-16 DIAGNOSIS — I350 Nonrheumatic aortic (valve) stenosis: Secondary | ICD-10-CM | POA: Diagnosis not present

## 2018-09-21 DIAGNOSIS — Z45010 Encounter for checking and testing of cardiac pacemaker pulse generator [battery]: Secondary | ICD-10-CM | POA: Diagnosis not present

## 2018-09-21 DIAGNOSIS — Z950 Presence of cardiac pacemaker: Secondary | ICD-10-CM | POA: Diagnosis not present

## 2018-09-23 DIAGNOSIS — Z01810 Encounter for preprocedural cardiovascular examination: Secondary | ICD-10-CM | POA: Diagnosis not present

## 2018-09-23 DIAGNOSIS — Z1159 Encounter for screening for other viral diseases: Secondary | ICD-10-CM | POA: Diagnosis not present

## 2018-09-24 DIAGNOSIS — I10 Essential (primary) hypertension: Secondary | ICD-10-CM | POA: Diagnosis not present

## 2018-09-24 DIAGNOSIS — I350 Nonrheumatic aortic (valve) stenosis: Secondary | ICD-10-CM | POA: Diagnosis not present

## 2018-09-24 DIAGNOSIS — I2510 Atherosclerotic heart disease of native coronary artery without angina pectoris: Secondary | ICD-10-CM | POA: Diagnosis not present

## 2018-09-24 DIAGNOSIS — I4891 Unspecified atrial fibrillation: Secondary | ICD-10-CM | POA: Diagnosis not present

## 2018-09-24 DIAGNOSIS — Z950 Presence of cardiac pacemaker: Secondary | ICD-10-CM | POA: Diagnosis not present

## 2018-09-24 DIAGNOSIS — E039 Hypothyroidism, unspecified: Secondary | ICD-10-CM | POA: Diagnosis not present

## 2018-09-25 DIAGNOSIS — R9389 Abnormal findings on diagnostic imaging of other specified body structures: Secondary | ICD-10-CM | POA: Diagnosis not present

## 2018-09-25 DIAGNOSIS — K319 Disease of stomach and duodenum, unspecified: Secondary | ICD-10-CM | POA: Diagnosis not present

## 2018-09-25 DIAGNOSIS — I4811 Longstanding persistent atrial fibrillation: Secondary | ICD-10-CM | POA: Diagnosis not present

## 2018-10-01 ENCOUNTER — Telehealth: Payer: Self-pay

## 2018-10-01 NOTE — Telephone Encounter (Signed)
Pt came to office.  Wanted to speak with Dr. [name].   After he was given office phone # to call with needs he walked to his car and called office.    Scheduler misunderstood that [facility] was on the [facility] to speak with DOD.  This nurse went to speak with Pt outside.  [facility] seeing [facility]-they are advising valve replacement.    Unsure of what needs are.  Received phone # for [place] with [place] clinic.  Let Jeannie Done message stating if she needs cardiac clearance to submit to [phone].  Advised if any other needs to call this nurse.

## 2018-10-06 DIAGNOSIS — Z1159 Encounter for screening for other viral diseases: Secondary | ICD-10-CM | POA: Diagnosis not present

## 2018-10-06 DIAGNOSIS — I4811 Longstanding persistent atrial fibrillation: Secondary | ICD-10-CM | POA: Diagnosis not present

## 2018-10-07 ENCOUNTER — Telehealth: Payer: Self-pay

## 2018-10-07 DIAGNOSIS — K222 Esophageal obstruction: Secondary | ICD-10-CM | POA: Diagnosis not present

## 2018-10-07 DIAGNOSIS — K449 Diaphragmatic hernia without obstruction or gangrene: Secondary | ICD-10-CM | POA: Diagnosis not present

## 2018-10-07 DIAGNOSIS — R933 Abnormal findings on diagnostic imaging of other parts of digestive tract: Secondary | ICD-10-CM | POA: Diagnosis not present

## 2018-10-07 DIAGNOSIS — K3189 Other diseases of stomach and duodenum: Secondary | ICD-10-CM | POA: Diagnosis not present

## 2018-10-07 DIAGNOSIS — I4891 Unspecified atrial fibrillation: Secondary | ICD-10-CM | POA: Diagnosis not present

## 2018-10-07 DIAGNOSIS — I442 Atrioventricular block, complete: Secondary | ICD-10-CM | POA: Diagnosis not present

## 2018-10-07 DIAGNOSIS — K319 Disease of stomach and duodenum, unspecified: Secondary | ICD-10-CM | POA: Diagnosis not present

## 2018-10-07 NOTE — Telephone Encounter (Signed)
-----   Message from [place], [facility] sent at 10/02/2018  2:16 PM EDT ----- Regarding: RE: switching to coumadin s/p TAVR HI Sonia Baller,   It will be a pleasure. He is a Dr Martinique patient, so please get paperwork send to [facility].  We will need to know discharge day to schedule visit.  Thanks  Raquel Rodriguez-Guzman PharmD, BCPS, [place] [address] 10/02/2018 2:18 PM ----- Message ----- From: [name], RN Sent: 10/02/2018  10:16 AM EDT To: Cv Div Ch [facility] Anticoag, Cv Div Pharmd Subject: switching to coumadin s/p TAVR                 Hey all,  This Pt is scheduled for a TAVR at the [facility] clinic on October 28, 2018.  Jeannie Done from the [facility] clinic just called me to see if we could manage his coumadin after his TAVR.  She is sending [place] an order.  Just wanted to let you folks know.  Thank you!  And let me know if I need to do anything.  Sonia Baller

## 2018-10-27 ENCOUNTER — Telehealth: Payer: Self-pay | Admitting: Cardiovascular Disease

## 2018-10-27 DIAGNOSIS — Z1159 Encounter for screening for other viral diseases: Secondary | ICD-10-CM | POA: Diagnosis not present

## 2018-10-27 DIAGNOSIS — I4811 Longstanding persistent atrial fibrillation: Secondary | ICD-10-CM | POA: Diagnosis not present

## 2018-10-27 DIAGNOSIS — Z950 Presence of cardiac pacemaker: Secondary | ICD-10-CM | POA: Diagnosis not present

## 2018-10-27 DIAGNOSIS — I350 Nonrheumatic aortic (valve) stenosis: Secondary | ICD-10-CM | POA: Diagnosis not present

## 2018-10-27 NOTE — Telephone Encounter (Signed)
Pt is scheduled to have surgery and wanted to get in to see Dr. [name] ASAP. He has an appt scheduled to see the PA, Oda Kilts on 09/30 but was hoping Dr. [name] would get him in sooner

## 2018-10-28 DIAGNOSIS — Z952 Presence of prosthetic heart valve: Secondary | ICD-10-CM | POA: Diagnosis not present

## 2018-10-28 DIAGNOSIS — I350 Nonrheumatic aortic (valve) stenosis: Secondary | ICD-10-CM | POA: Diagnosis not present

## 2018-10-28 DIAGNOSIS — E785 Hyperlipidemia, unspecified: Secondary | ICD-10-CM | POA: Diagnosis present

## 2018-10-28 DIAGNOSIS — E039 Hypothyroidism, unspecified: Secondary | ICD-10-CM | POA: Diagnosis present

## 2018-10-28 DIAGNOSIS — I442 Atrioventricular block, complete: Secondary | ICD-10-CM | POA: Diagnosis present

## 2018-10-28 DIAGNOSIS — I4819 Other persistent atrial fibrillation: Secondary | ICD-10-CM | POA: Diagnosis present

## 2018-10-28 DIAGNOSIS — Z950 Presence of cardiac pacemaker: Secondary | ICD-10-CM | POA: Diagnosis not present

## 2018-10-28 DIAGNOSIS — I700 Atherosclerosis of aorta: Secondary | ICD-10-CM | POA: Diagnosis not present

## 2018-10-28 DIAGNOSIS — Z7901 Long term (current) use of anticoagulants: Secondary | ICD-10-CM | POA: Diagnosis not present

## 2018-10-28 DIAGNOSIS — Z45018 Encounter for adjustment and management of other part of cardiac pacemaker: Secondary | ICD-10-CM | POA: Diagnosis not present

## 2018-10-28 DIAGNOSIS — R918 Other nonspecific abnormal finding of lung field: Secondary | ICD-10-CM | POA: Diagnosis not present

## 2018-10-28 DIAGNOSIS — I340 Nonrheumatic mitral (valve) insufficiency: Secondary | ICD-10-CM | POA: Diagnosis not present

## 2018-10-28 DIAGNOSIS — I10 Essential (primary) hypertension: Secondary | ICD-10-CM | POA: Diagnosis present

## 2018-10-28 DIAGNOSIS — R9431 Abnormal electrocardiogram [ECG] [EKG]: Secondary | ICD-10-CM | POA: Diagnosis not present

## 2018-10-28 DIAGNOSIS — Z45010 Encounter for checking and testing of cardiac pacemaker pulse generator [battery]: Secondary | ICD-10-CM | POA: Diagnosis not present

## 2018-10-28 DIAGNOSIS — Z006 Encounter for examination for normal comparison and control in clinical research program: Secondary | ICD-10-CM | POA: Diagnosis not present

## 2018-10-28 NOTE — Telephone Encounter (Signed)
Daughter, [name], called to [facility] clinic stating her father is having a "valve procedure" at the [facility] clinic - should be home end of week and was instructed for follow up ASAP with Dr. [name] - concerned his current appt is not with Dr. [name]. SHe can be reached at [phone]

## 2018-10-29 DIAGNOSIS — R9431 Abnormal electrocardiogram [ECG] [EKG]: Secondary | ICD-10-CM | POA: Diagnosis not present

## 2018-10-29 DIAGNOSIS — R918 Other nonspecific abnormal finding of lung field: Secondary | ICD-10-CM | POA: Diagnosis not present

## 2018-10-29 DIAGNOSIS — I700 Atherosclerosis of aorta: Secondary | ICD-10-CM | POA: Diagnosis not present

## 2018-10-30 NOTE — Telephone Encounter (Signed)
His pacemaker was at [facility] in April!  Did they replace it at [facility]? If not, he needs generator change next week.  I should probably see virtually today (9/18) unless he has had generator change already.

## 2018-10-31 ENCOUNTER — Telehealth: Payer: Self-pay | Admitting: Pharmacist Clinician (PhC)/ Clinical Pharmacy Specialist

## 2018-10-31 ENCOUNTER — Telehealth: Payer: Self-pay

## 2018-10-31 MED ORDER — NEXTERONE IV
81.00 | INTRAVENOUS | Status: DC
Start: 2018-10-30 — End: 2018-10-31

## 2018-10-31 MED ORDER — SUPER B-50 B COMPLEX PO
0.20 | ORAL | Status: DC
Start: ? — End: 2018-10-31

## 2018-10-31 MED ORDER — TRONOLANE 0.25 % RE SUPP
5.00 | RECTAL | Status: DC
Start: 2018-10-29 — End: 2018-10-31

## 2018-10-31 MED ORDER — ASPIRIN-SALICYLAMIDE-CAFFEINE 650-195-32 MG PO PACK
75.00 | PACK | ORAL | Status: DC
Start: 2018-10-30 — End: 2018-10-31

## 2018-10-31 NOTE — Telephone Encounter (Signed)
Received staff message to contact patient regarding need for coumadin appointment.  Patient had TAVR on Tuesday at [facility] and was returning home by the end of the week.    Per chart, patient has been on Eliquis prior to surgery and [facility] discharge mentions that patient should continue apixaban in several locations.    LMOM for daughter to call back and clarify if patient on warfarin or apixaban

## 2018-10-31 NOTE — Telephone Encounter (Signed)
Patient confirmed that he is still on Eliquis.  Was never started on warfarin.

## 2018-10-31 NOTE — Telephone Encounter (Signed)
Call placed to daughter.  Advised Dr. Martinique is Pt's primary cardiologist and he needs to follow Pt s/p TAVR.  Advised sent message to Dr. Martinique and coumadin clinic at [facility] to schedule appropriate follow up.

## 2018-10-31 NOTE — Telephone Encounter (Signed)
Patient scheduled for APP visit with [facility] PA on 11/06/2018 @ 1015am

## 2018-10-31 NOTE — Telephone Encounter (Signed)
Spoke to patient Dr.Jordan advised needs appointment next week with a extender.TAVR done 10/28/18 at [facility] in [place].Also needs a INR done [facility] 9/21 or Tue 9/22.Advised scheduler will call back with a INR appointment.Appointment scheduled with Kerin Ransom PA 9/24 at 10:15 am.

## 2018-10-31 NOTE — Telephone Encounter (Signed)
Message sent to schedulers to call pt and schedule

## 2018-11-03 ENCOUNTER — Telehealth: Payer: Self-pay

## 2018-11-03 NOTE — Telephone Encounter (Signed)
Spoke to patient he stated he is presently driving to [facility] his wife is in hospital not expected to live.Stated he may not be able to keep post hospital appointment scheduled 9/24 with Kerin Ransom PA.Stated he will call back to cancel if he is unable to keep appointment.

## 2018-11-03 NOTE — Telephone Encounter (Signed)
If we know for sure that his generator has been replaced, appropriate follow-up for no device related issues would be with Dr Martinique.  May also benefit from seeing Jonni Sanger to evaluate device programming.    If he wishes to have [place] follow his device then EP can continue to follow.  If he prefers to have his EP care in [place] [facility]), then we will just see as needed.

## 2018-11-06 ENCOUNTER — Ambulatory Visit: Payer: Medicare Other | Admitting: Cardiology

## 2018-11-11 NOTE — Progress Notes (Signed)
Electrophysiology Office Note Date: 11/11/2018  ID:  Gregory [name], DOB 1920-09-25, MRN [id number]  PCP: Lanice Shirts, MD Primary Cardiologist: Peter Martinique, MD Electrophysiologist: None  CC: Pacemaker follow-up  Gregory [name] is a 85 y.o. male seen today for Dr. [name]. He presents today for routine electrophysiology followup. He has been followed in [place] [facility]) this year for TAVR and gen change of his device in May of 2020. He is doing OK currently. His wife passed away 6 days ago, and he is having a very difficult time. They were married for 66 years, and met in [place], but lived in [place] for 50 years. Currently, he denies chest pain, palpitations, dyspnea, PND, orthopnea, nausea, vomiting, dizziness, syncope, edema, weight gain, or early satiety.  Echo 08/20/18 [facility]) LVEF 59% Moderate to severe Aortic stenosis  Device History: Medtronic Single Chamber PPM implanted 2010, gen change 06/2018 [facility]) for SSS/CHB  Past Medical History:  Diagnosis Date  . Atrial flutter ([place])    s/p ablation  . Cancer ([name])    skin cancer  . Cataracts, bilateral   . Hypertension   . Neuromuscular disorder ([place])   . Pacemaker 2010  . Thyroid disease    Past Surgical History:  Procedure Laterality Date  . APPENDECTOMY    . COLONOSCOPY    . EYE SURGERY Bilateral    cataract surgery  . HERNIA REPAIR Right   . INGUINAL HERNIA REPAIR Left 10/12/2013   Procedure: LEFT  INGUINAL HERNIA REPAIR;  Surgeon: Odis Hollingshead, MD;  Location: [place];  Service: General;  Laterality: Left;  . INSERTION OF MESH Left 10/12/2013   Procedure: INSERTION OF MESH;  Surgeon: Odis Hollingshead, MD;  Location: [place];  Service: General;  Laterality: Left;    Current Outpatient Medications  Medication Sig Dispense Refill  . apixaban (ELIQUIS) 2.5 MG TABS tablet Take 1 tablet (2.5 mg total) by mouth 2 (two) times daily. 180 tablet 1  . CALCIUM PO Take 1 tablet by mouth daily.     .  cholecalciferol (VITAMIN D) 1000 UNITS tablet Take 3,000 Units by mouth daily.     . Coenzyme Q10 200 MG TABS Take 200 mg by mouth daily.     Marland Kitchen levothyroxine (SYNTHROID, LEVOTHROID) 75 MCG tablet Take 75 mcg by mouth daily before breakfast.    . MAGNESIUM PO Take 1-2 tablets by mouth daily.     Marland Kitchen POTASSIUM CHLORIDE PO Take 1 tablet by mouth daily.    . vitamin B-12 (CYANOCOBALAMIN) 1000 MCG tablet Take 1,000 mcg by mouth daily.     . vitamin C (ASCORBIC ACID) 500 MG tablet Take 1,500 mg by mouth daily.      Current Facility-Administered Medications  Medication Dose Route Frequency Provider Last Rate Last Dose  . apixaban (ELIQUIS) tablet 2.5 mg  2.5 mg Oral BID Martinique, Peter M, MD        Allergies:   Penicillins   Social History: Social History   Socioeconomic History  . Marital status: Married    Spouse name: Not on file  . Number of children: 2  . Years of education: Not on file  . Highest education level: Not on file  Occupational History  . Occupation: retired Scientific laboratory technician Needs  . Financial resource strain: Not on file  . Food insecurity    Worry: Not on file    Inability: Not on file  . Transportation needs    Medical: Not on file  Non-medical: Not on file  Tobacco Use  . Smoking status: Never Smoker  . Smokeless tobacco: Never Used  Substance and Sexual Activity  . Alcohol use: No  . Drug use: No  . Sexual activity: Never  Lifestyle  . Physical activity    Days per week: Not on file    Minutes per session: Not on file  . Stress: Not on file  Relationships  . Social Herbalist on phone: Not on file    Gets together: Not on file    Attends religious service: Not on file    Active member of club or organization: Not on file    Attends meetings of clubs or organizations: Not on file    Relationship status: Not on file  . Intimate partner violence    Fear of current or ex partner: Not on file    Emotionally abused: Not on file     Physically abused: Not on file    Forced sexual activity: Not on file  Other Topics Concern  . Not on file  Social History Narrative  . Not on file    Family History: Family History  Problem Relation Age of Onset  . Heart disease Mother   . Cancer Father        leukemia   Review of Systems: All other systems reviewed and are otherwise negative except as noted above.  Physical Exam: Vitals:   11/12/18 1228  BP: (!) 144/62  Pulse: 71  Weight: 143 lb (64.9 kg)  Height: '5\' 9"'  (1.753 m)     GEN- The patient is elderly appearing, alert and oriented x 3 today.   HEENT: normocephalic, atraumatic; sclera clear, conjunctiva pink; hearing intact; oropharynx clear; neck supple  Lungs- Clear to ausculation bilaterally, normal work of breathing.  No wheezes, rales, rhonchi Heart- Regular rate and rhythm, no murmurs, rubs or gallops  GI- soft, non-tender, non-distended, bowel sounds present  Extremities- no clubbing or cyanosis. Soft, trace to 1+ edema, stable per patient.   MS- no significant deformity or atrophy Skin- warm and dry, no rash or lesion; PPM pocket well healed Psych- euthymic mood, full affect Neuro- strength and sensation are intact  PPM Interrogation- reviewed in detail today,  See PACEART report  EKG:  EKG is ordered today. The ekg ordered today shows V paced at 71 bpm, QRS 188 ms.   Recent Labs: No results found for requested labs within last 8760 hours.   Wt Readings from Last 3 Encounters:  01/23/18 152 lb (68.9 kg)  11/13/17 147 lb (66.7 kg)  06/27/17 158 lb (71.7 kg)     Other studies Reviewed: Additional studies/ records that were reviewed today include: Notes from [facility] clinic in [place], Previous EP office notes, Previous remote checks, Most recent labwork.   Assessment and Plan:  1.  Symptomatic bradycardia s/p Medtronic PPM  Gen change 06/2018 at [facility] clinic.  Normal PPM function See Claudia Desanctis Art report No changes today He requests that his remotes  come to this office.  2. Atypical Atrial Flutter Well controlled Continue eliquis.  3. S/p TAVR 10/28/2018 At [facility] clinic Follow up with Dr. Martinique 11/6 He also has follow up at [facility] in October.    Current medicines are reviewed at length with the patient today.   The patient does not have concerns regarding his medicines.  The following changes were made today:  none  Labs/ tests ordered today include:  Orders Placed This Encounter  Procedures  .  EKG 12-Lead    Disposition:   Follow up with Dr. [name] in 12 months.   Jacalyn Lefevre, PA-C  11/11/2018 7:16 PM  [place] [address] [phone] (office) [phone] (fax)

## 2018-11-12 ENCOUNTER — Ambulatory Visit (INDEPENDENT_AMBULATORY_CARE_PROVIDER_SITE_OTHER): Payer: Medicare Other | Admitting: Student

## 2018-11-12 ENCOUNTER — Other Ambulatory Visit: Payer: Self-pay

## 2018-11-12 ENCOUNTER — Telehealth: Payer: Self-pay | Admitting: Student

## 2018-11-12 VITALS — BP 144/62 | HR 71 | Ht 69.0 in | Wt 143.0 lb

## 2018-11-12 DIAGNOSIS — I4892 Unspecified atrial flutter: Secondary | ICD-10-CM

## 2018-11-12 DIAGNOSIS — I4589 Other specified conduction disorders: Secondary | ICD-10-CM | POA: Diagnosis not present

## 2018-11-12 DIAGNOSIS — Z950 Presence of cardiac pacemaker: Secondary | ICD-10-CM | POA: Diagnosis not present

## 2018-11-12 DIAGNOSIS — I442 Atrioventricular block, complete: Secondary | ICD-10-CM

## 2018-11-12 DIAGNOSIS — I350 Nonrheumatic aortic (valve) stenosis: Secondary | ICD-10-CM

## 2018-11-12 LAB — CUP PACEART INCLINIC DEVICE CHECK
Date Time Interrogation Session: 20200930141647
Implantable Lead Implant Date: 20021114
Implantable Lead Location: 753860
Implantable Lead Model: 5076
Implantable Pulse Generator Implant Date: 20200506

## 2018-11-12 NOTE — Telephone Encounter (Signed)
Pt would like his remotes to be transferred back to [facility] from [facility] [place]) as he lives here most of the year.   Thank you!  Legrand Como [address] [place], PA-C  11/12/2018 2:08 PM

## 2018-11-12 NOTE — Patient Instructions (Signed)
Medication Instructions:  Your physician recommends that you continue on your current medications as directed. Please refer to the Current Medication list given to you today.   If you need a refill on your cardiac medications before your next appointment, please call your pharmacy.   Lab work: NONE ORDERED  TODAY   If you have labs (blood work) drawn today and your tests are completely normal, you will receive your results only by: Marland Kitchen MyChart Message (if you have MyChart) OR . A paper copy in the mail If you have any lab test that is abnormal or we need to change your treatment, we will call you to review the results.  Testing/Procedures: NONE ORDERED  TODAY    Follow-Up:  AS SCHEDULED   At [facility], you and your health needs are our priority.  As part of our continuing mission to provide you with exceptional heart care, we have created designated Provider Care Teams.  These Care Teams include your primary Cardiologist (physician) and Advanced Practice Providers (APPs -  Physician Assistants and Nurse Practitioners) who all work together to provide you with the care you need, when you need it. You will need a follow up appointment in 1 years.  Please call our office 2 months in advance to schedule this appointment.  You may see  one of the following Advanced Practice Providers on your designated Care Team:   Chanetta Marshall, NP . Tommye Standard, PA-C . Joesph July PA-C  Any Other Special Instructions Will Be Listed Below (If Applicable).

## 2018-11-12 NOTE — Telephone Encounter (Signed)
I have requested the pt to be transferred into our clinic in [place]. I also called the [facility] [phone]) and requested a call back to discuss the pt to be release in [facility].

## 2018-11-13 NOTE — Telephone Encounter (Signed)
Pt has been released and is in our Clinic with [facility].

## 2018-12-03 DIAGNOSIS — Z952 Presence of prosthetic heart valve: Secondary | ICD-10-CM | POA: Diagnosis not present

## 2018-12-03 DIAGNOSIS — I071 Rheumatic tricuspid insufficiency: Secondary | ICD-10-CM | POA: Diagnosis not present

## 2018-12-03 DIAGNOSIS — I350 Nonrheumatic aortic (valve) stenosis: Secondary | ICD-10-CM | POA: Diagnosis not present

## 2018-12-03 DIAGNOSIS — Z950 Presence of cardiac pacemaker: Secondary | ICD-10-CM | POA: Diagnosis not present

## 2018-12-05 ENCOUNTER — Encounter (INDEPENDENT_AMBULATORY_CARE_PROVIDER_SITE_OTHER): Payer: Self-pay

## 2018-12-05 DIAGNOSIS — Z23 Encounter for immunization: Secondary | ICD-10-CM | POA: Diagnosis not present

## 2018-12-05 DIAGNOSIS — D649 Anemia, unspecified: Secondary | ICD-10-CM | POA: Diagnosis not present

## 2018-12-05 DIAGNOSIS — Z952 Presence of prosthetic heart valve: Secondary | ICD-10-CM | POA: Diagnosis not present

## 2018-12-05 DIAGNOSIS — R2681 Unsteadiness on feet: Secondary | ICD-10-CM | POA: Diagnosis not present

## 2018-12-05 DIAGNOSIS — R634 Abnormal weight loss: Secondary | ICD-10-CM | POA: Diagnosis not present

## 2018-12-09 ENCOUNTER — Other Ambulatory Visit (HOSPITAL_COMMUNITY): Payer: Self-pay | Admitting: *Deleted

## 2018-12-09 DIAGNOSIS — Z952 Presence of prosthetic heart valve: Secondary | ICD-10-CM

## 2018-12-10 ENCOUNTER — Telehealth (HOSPITAL_COMMUNITY): Payer: Self-pay | Admitting: *Deleted

## 2018-12-10 DIAGNOSIS — C44311 Basal cell carcinoma of skin of nose: Secondary | ICD-10-CM | POA: Diagnosis not present

## 2018-12-10 DIAGNOSIS — D485 Neoplasm of uncertain behavior of skin: Secondary | ICD-10-CM | POA: Diagnosis not present

## 2018-12-10 DIAGNOSIS — L570 Actinic keratosis: Secondary | ICD-10-CM | POA: Diagnosis not present

## 2018-12-10 NOTE — Telephone Encounter (Signed)
-----   Message from Peter M Martinique, MD sent at 12/10/2018  3:10 PM EDT ----- Regarding: RE: Please Sign Cardiac Rehab Referral Seems like a good idea to me. I am out of the office this week but can sign next week.  Peter Martinique MD, [facility]  ----- Message ----- From: Rowe Pavy, RN Sent: 12/09/2018   3:43 PM EDT To: Peter M Martinique, MD Subject: Please Sign Cardiac Rehab Referral             Dr. Martinique,  We received a notification from the above pt internal medicine doctor Dr. Cipriano Mile at [facility].  As you know, pt underwent TAVR procedure in May at the the [facility] clinic in [place].  Pt will be spending the majority of his time here in [place] and would like to participate in Cardiac rehab.  I see that he has an upcoming appt with you on 11/6 and he saw Jonni Sanger in the Heart failure clinic in September.  I have placed a referral tentatively awaiting your cosignature.  If you agree that this would be a benefit for him, please sign.  We will schedule him to begin after you see him on the 6th.   Thank you so much [place] Armed forces operational officer, BSN Cardiac and Pulmonary Rehab Nurse Navigator

## 2018-12-12 ENCOUNTER — Telehealth: Payer: Self-pay

## 2018-12-12 NOTE — Telephone Encounter (Signed)
Spoke to patient about virtual appointment with Dr.Jordan 12/19/18.Patient stated he does not want a virtual appointment.He wants to see Dr.Jordan at office.He does not want to see a extender only Dr.Jordan.Appointment rescheduled to 01/29/19 at 10:40 am.Advised to call sooner if needed.

## 2018-12-12 NOTE — Telephone Encounter (Signed)
Received a call from patient's daughter [name].She was calling to find out why father's appointment was changed.Advised 11/6 appointment was a virtual appointment and father only wants a office appointment. Patient stated he was not having any problems.He was offered appointment 11/25 at 8:40 am, he refused it was too early.He also only wants to see Dr.Jordan.Appointment was rescheduled to 01/29/19 at 10:40 am.Advised patient to call sooner if needed.

## 2018-12-19 ENCOUNTER — Ambulatory Visit: Payer: Medicare Other | Admitting: Cardiology

## 2018-12-22 ENCOUNTER — Telehealth (HOSPITAL_COMMUNITY): Payer: Self-pay | Admitting: *Deleted

## 2018-12-22 NOTE — Telephone Encounter (Signed)
-----   Message from Peter M Martinique, MD sent at 12/20/2018  3:56 PM EST ----- Regarding: RE: Ok to proceed with group exercise at Cardiac Rehab Yes he should be able to proceed with Rehab.  Peter Martinique MD, [facility]  ----- Message ----- From: Rowe Pavy, RN Sent: 12/19/2018  12:44 PM EST To: Peter M Martinique, MD Subject: Gregory [name] to proceed with group exercise at Cardiac#  Dr. Martinique,  The above pt was scheduled for virtual visit with you today s/p 10/28/18 TAVR at [facility] clinic in [place].  This was canceled today by pt who wanted an in -person appointment.   This is scheduled for 12/17.  (There was an earlier appt later this month with you but is was at 8:40 which was too early for him.)  As far as exercise at cardiac rehab, per [facility] pt did complete his follow up on 10/21 with Dr. Lucius Conn Laura at the [facility] clinic in [place] and on 10/23 pt saw Er. Marisa Cyphers Internal Med at [facility].  Pt to get started on Iron supplements.  Pt HGB  8.7 on 10/23 down from 10.2 on 10/4 iron -27. Pt seen by GI - Dr. Earnie Larsson at [facility].  Unfortunately I can not see the detailed notes for this appt and  therefore uncertain of the plan.  Pt did complete appt  here at [facility] with Oda Kilts PA on 9/30 ekg completed.    If his anemia is  improving/stable, are you okay with him proceeding with group exercise prior to your follow up appt with him later in December?  Thanks for your advice Maurice Small RN, BSN Cardiac and Pulmonary Rehab Nurse Navigator

## 2018-12-23 ENCOUNTER — Telehealth (HOSPITAL_COMMUNITY): Payer: Self-pay | Admitting: *Deleted

## 2018-12-23 NOTE — Telephone Encounter (Signed)
Called and spoke to pt regarding cardiac rehab referral s/p TAVR on 9/15 at the [facility] clinic in [place] dia.  Pt to have  anemia work up -Pt sees [facility] MD. Pt is on his way to breakfast and asked for call back first of the week.  Pt has upcoming appt on 11/13 with his internal med MD and 12/1 for initial consult for GI.  Will follow up with pt after appt completed on 11/13 as requested. Cherre Huger, BSN Cardiac and Training and development officer

## 2018-12-26 DIAGNOSIS — D509 Iron deficiency anemia, unspecified: Secondary | ICD-10-CM | POA: Diagnosis not present

## 2018-12-26 DIAGNOSIS — R634 Abnormal weight loss: Secondary | ICD-10-CM | POA: Diagnosis not present

## 2019-01-04 LAB — CUP PACEART REMOTE DEVICE CHECK
Battery Remaining Longevity: 152 mo
Battery Voltage: 3.16 V
Brady Statistic RV Percent Paced: 99.9 %
Date Time Interrogation Session: 20201122005932
Implantable Lead Implant Date: 20021114
Implantable Lead Location: 753860
Implantable Lead Model: 5076
Implantable Pulse Generator Implant Date: 20200506
Lead Channel Impedance Value: 380 Ohm
Lead Channel Impedance Value: 437 Ohm
Lead Channel Pacing Threshold Amplitude: 0.875 V
Lead Channel Pacing Threshold Pulse Width: 0.4 ms
Lead Channel Sensing Intrinsic Amplitude: 7 mV
Lead Channel Sensing Intrinsic Amplitude: 7 mV
Lead Channel Setting Pacing Amplitude: 2 V
Lead Channel Setting Pacing Pulse Width: 0.4 ms
Lead Channel Setting Sensing Sensitivity: 0.9 mV

## 2019-01-05 ENCOUNTER — Ambulatory Visit (INDEPENDENT_AMBULATORY_CARE_PROVIDER_SITE_OTHER): Payer: Medicare Other | Admitting: *Deleted

## 2019-01-05 DIAGNOSIS — R001 Bradycardia, unspecified: Secondary | ICD-10-CM

## 2019-01-07 ENCOUNTER — Ambulatory Visit: Payer: Medicare Other | Admitting: Cardiology

## 2019-01-12 NOTE — Telephone Encounter (Signed)
Called patient at requested number--number is for [name], daughter. Not on DPR. She requests I call patient to explain recent PPM transmission results. Spoke with patient, he requests that I call [name] to give results, verbal permission given.  Spoke with [name] again. Reassured her that transmission shows normal device function. Reviewed results with daughter. She was concerned as report viewable on her end showed only a "frowny face" and graphs with no interpretation, so she thought something was wrong. All questions answered, [name] thanked me for my call.

## 2019-01-13 DIAGNOSIS — Z862 Personal history of diseases of the blood and blood-forming organs and certain disorders involving the immune mechanism: Secondary | ICD-10-CM | POA: Diagnosis not present

## 2019-01-13 DIAGNOSIS — Z01812 Encounter for preprocedural laboratory examination: Secondary | ICD-10-CM | POA: Diagnosis not present

## 2019-01-13 DIAGNOSIS — D500 Iron deficiency anemia secondary to blood loss (chronic): Secondary | ICD-10-CM | POA: Diagnosis not present

## 2019-01-13 DIAGNOSIS — R198 Other specified symptoms and signs involving the digestive system and abdomen: Secondary | ICD-10-CM | POA: Diagnosis not present

## 2019-01-13 DIAGNOSIS — Z7901 Long term (current) use of anticoagulants: Secondary | ICD-10-CM | POA: Diagnosis not present

## 2019-01-26 NOTE — Progress Notes (Deleted)
Gregory [name] Date of Birth: Jul 08, 1920 Medical Record [id number]  History of Present Illness: Gregory [name] is seen for cardiac follow up. He is a pleasant 85 yo WM with history of atrial flutter and pacemaker. He reports  his cardiac care had been at the [facility] clinic in [place]. He reports that he had significant bradycardia in 2002 and had a pacemaker placed (Medtronic). This was revised in 2010.  He has a history of atrial flutter and had a successful ablation in the past. He denies any history of CAD, MI, or CHF.   When  seen by me in January 2018  he was found to be in atrial flutter and was tracking this at a high rate. He was started on Eliquis. Seen by Dr. [name] and pacemaker reprogrammed so he wouldn't track as fast. Attempted to pace terminate his Aflutter without success. When seen later in April he was found to be back in NSR. Seen earlier this year and pacemaker at [facility]. Planned generator change out but this was delayed due to Covid. He was subsequently seen at [facility] clinic in [place] and had generator change out on Jun 18, 2018. Pacemaker downgraded from dual chamber to single chamber device due to chronic Afib. He also had follow up Echo showing moderate to severe AS. Mean gradient 30 mm Hg, valve index 0.52. According to records he was asymptomatic. Cardiac cath showed 50% stenosis in the distal LCx and mid RCA. There was 90% stenosis in the distal RCA and PL branches of the RCA. He underwent TAVR on 10/28/18. He was maintained on ASA and Eliquis post TAVR. Last device interrogation 10/31/18.   He is now staying in [place] primarily. 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 He is always on the go.    He denies any chest pain, dyspnea, or palpitations. No dizziness. Overall feels well.   Allergies as of 01/29/2019      Reactions   Penicillins Hives, Rash      Medication List       Accurate as of January 26, 2019  7:51 PM.  If you have any questions, ask your nurse or doctor.        apixaban 2.5 MG Tabs tablet Commonly known as: Eliquis Take 1 tablet (2.5 mg total) by mouth 2 (two) times daily.   apixaban 5 MG Tabs tablet Commonly known as: ELIQUIS Take 5 mg by mouth 2 (two) times daily.   CALCIUM PO Take 1 tablet by mouth daily.   cholecalciferol 1000 units tablet Commonly known as: VITAMIN D Take 3,000 Units by mouth daily.   Coenzyme Q10 200 MG Tabs Take 200 mg by mouth daily.   levothyroxine 75 MCG tablet Commonly known as: SYNTHROID Take 75 mcg by mouth daily before breakfast.   MAGNESIUM PO Take 1-2 tablets by mouth daily.   POTASSIUM CHLORIDE PO Take 1 tablet by mouth daily.   vitamin B-12 1000 MCG tablet Commonly known as: CYANOCOBALAMIN Take 1,000 mcg by mouth daily.   vitamin C 500 MG tablet Commonly known as: ASCORBIC ACID Take 1,500 mg by mouth daily.        Allergies  Allergen Reactions  . Penicillins Hives and Rash    Past Medical History:  Diagnosis Date  . Atrial flutter ([place])    s/p ablation  . Cancer ([place])    skin cancer  . Cataracts, bilateral   . Hypertension   . Neuromuscular disorder (  [place])   . Pacemaker 2010  . Thyroid disease     Past Surgical History:  Procedure Laterality Date  . APPENDECTOMY    . COLONOSCOPY    . EYE SURGERY Bilateral    cataract surgery  . HERNIA REPAIR Right   . INGUINAL HERNIA REPAIR Left 10/12/2013   Procedure: LEFT  INGUINAL HERNIA REPAIR;  Surgeon: Odis Hollingshead, MD;  Location: [place];  Service: General;  Laterality: Left;  . INSERTION OF MESH Left 10/12/2013   Procedure: INSERTION OF MESH;  Surgeon: Odis Hollingshead, MD;  Location: [place];  Service: General;  Laterality: Left;    Social History   Socioeconomic History  . Marital status: Married    Spouse name: Not on file  . Number of children: 2  . Years of education: Not on file  . Highest education level: Not on file  Occupational History  .  Occupation: retired Actor  Tobacco Use  . Smoking status: Never Smoker  . Smokeless tobacco: Never Used  Substance and Sexual Activity  . Alcohol use: No  . Drug use: No  . Sexual activity: Never  Other Topics Concern  . Not on file  Social History Narrative  . Not on file   Social Determinants of Health   Financial Resource Strain:   . Difficulty of Paying Living Expenses: Not on file  Food Insecurity:   . Worried About Charity fundraiser in the Last Year: Not on file  . Ran Out of Food in the Last Year: Not on file  Transportation Needs:   . Lack of Transportation (Medical): Not on file  . Lack of Transportation (Non-Medical): Not on file  Physical Activity:   . Days of Exercise per Week: Not on file  . Minutes of Exercise per Session: Not on file  Stress:   . Feeling of Stress : Not on file  Social Connections:   . Frequency of Communication with Friends and Family: Not on file  . Frequency of Social Gatherings with Friends and Family: Not on file  . Attends Religious Services: Not on file  . Active Member of Clubs or Organizations: Not on file  . Attends Archivist Meetings: Not on file  . Marital Status: Not on file    Family History  Problem Relation Age of Onset  . Heart disease Mother   . Cancer Father        leukemia    Review of Systems: As noted in HPI.  All other systems were reviewed and are negative.  Physical Exam: There were no vitals taken for this visit. There were no vitals filed for this visit.  GENERAL:  Well appearing, elderly WM in NAD HEENT:  PERRL, EOMI, sclera are clear. Oropharynx is clear. NECK:  No jugular venous distention, carotid upstroke brisk and symmetric, no bruits, no thyromegaly or adenopathy LUNGS:  Clear to auscultation bilaterally CHEST:  Unremarkable HEART:  RRR,  PMI not displaced or sustained,S1 and S2 within normal limits, no S3, no S4: no clicks, no rubs, gr 3/6 harsh systolic murmur at the apex.   ABD:  Soft, nontender. BS +, no masses or bruits. No hepatomegaly, no splenomegaly EXT:  2 + pulses throughout, no edema, no cyanosis no clubbing SKIN:  Warm and dry.  No rashes NEURO:  Alert and oriented x 3. Cranial nerves II through XII intact. PSYCH:  Cognitively intact    LABORATORY DATA: Lab Results  Component Value Date   WBC  8.9 02/21/2016   HGB 10.4 (L) 02/21/2016   HCT 31.7 (L) 02/21/2016   PLT 229 02/21/2016   GLUCOSE 109 (H) 02/21/2016   ALT 13 02/21/2016   AST 20 02/21/2016   NA 135 02/21/2016   K 4.5 02/21/2016   CL 100 02/21/2016   CREATININE 1.26 (H) 02/21/2016   BUN 27 (H) 02/21/2016   CO2 27 02/21/2016   TSH 2.90 02/21/2016   INR 1.04 10/02/2013     Labs dated 04/19/15: cholesterol 153, triglycerides 50, LDL 87, HDL 56. Bun 29, creatinine 1.19. Other CMET normal. Hgb 10.6. Dated 05/23/16: creatinine 1.12. Other chemistries normal. Dated 05/24/17: cholesterol 168, triglycerides 65, HDL 65, LDL 90. Hgb 10.6. Creatinine 1.27. Other chemistries and TSH normal.  Dated 11/15/17: Hgb 10.2. CMET normal. Folate and B12 normal. TSH normal. Dated 09/05/18: cholesterol 178, triglycerides 39, HDL 76, LDL 94. TSH normal Dated 12/26/18: Hgb 9.4. otherwise CBC normal. Creatinine 1.2. otherwise CMET normal.   Echo 08/20/18: Final Impressions 1. Normal left ventricular chamber size. 2. Calculated 2-D biplane volumetric left ventricular ejection fraction 59 %. 3. No regional wall motion abnormalities. 4. Mildly enlarged right ventricular chamber size with normal function. 5. Estimated right ventricular systolic pressure 46 mmHg (systolic blood pressure 123XX123 mmHg). 6. Severely enlarged left atrial size (LAVI=50 mL/m2). 7. Moderate to severe aortic valve stenosis (peak velocity, 3.6 m/s; mean gradient, 30 mmHg; AVA=0.96 cm2; DSI=0.22). Normal stroke volume index. 8. Mild mitral valve regurgitation. 9. Moderately calcified mitral annulus with calcification and increased thickness of  aorto-mitral curtain. 10. No pericardial effusion. 11. Compared to the report of 04/17/2016 the following changes have occurred: Aortic stenosis has worsened.  Cardiac cath 09/24/18: PROCEDURE TYPES 1. CORONARY ANGIOGRAPHY 2. ULTRASOUND GUIDANCE FOR VASCULAR ACCESS FINAL DIAGNOSIS 1. Moderate coronary artery atherosclerosis PRE-PROCEDURE DIAGNOSIS 1. Stenosis Aortic Valve Acquired CORONARY DIAGNOSTIC SUMMARY Coronary artery dominance is right. Normal left main coronary.  The left main coronary artery is 10% obstructed by a discrete lesion.  The proximal left anterior descending artery is 30% obstructed by a discrete lesion. The distal segment is normal size, diseased.  The distal circumflex artery is 50% obstructed by multiple discrete lesions. The distal segment is normal size, diseased.  The middle right coronary artery is 50% obstructed by multiple discrete lesions. The distal segment is normal size, diseased.  The distal right coronary artery is 90% obstructed by a discrete lesion. The distal segment is normal size, diseased.  The right posterior descending artery is 90% obstructed by a discrete lesion. The distal segment is large size.  RADIATION DOSE DATA Procedure cumulative skin dose (mGy): 261.00 Procedure cumulative dose area product (Gy-cm**2): 20.71 Fluoro Time (Min): 9.00 CONTRAST DOSE DATA IOHEXOL 350 MG IODINE/ML INTRAVENOUS SOLUTION: 155mL  Echo 12/03/18:  Final Impressions 1. ECHOCARDIOGRAM 2. Goal directed echocardiogram 30 day s/p TAVR. 3. LEFT VENTRICLE: 4. Normal left ventricular chamber size. 5. Borderline increased concentric left ventricular wall thickness. 6. Calculated 2-D linear left ventricular ejection fraction 62 %. 7. CARDIAC VALVES: 8. Normal tissue aortic prosthesis. 9. Aortic valve systolic mean Doppler gradient 11 mmHg. 10. Aortic valve peak systolic velocity is, 2.2 m/sec. 11. Trivial aortic valve prosthetic regurgitation. 12.  Moderate-severe tricuspid valve regurgitation (no change compared to echo on 08/20/2018). 13. Pacemaker catheter visualized. 14. No pericardial effusion. 15. Compared to the report of 10/29/2018 the following changes have occurred: The prosthetic aortic velocity has increased slightly.   Assessment / Plan: 1. History of bradycardia s/p permanent pacemaker. Good pacemaker function in  November.  2. History of atrial flutter s/p ablation. Had recurrent Atrial flutter in 2018.  Later converted back to NSR. He is asymptomatic. [place] Vasc score of 2 based on age. No history of bleeding or CVA. I have recommended continuing  Eliquis 2.5 mg  Bid. 3. History of aortic stenosis. S/p TAVR in September. Follow up Echo at [facility] was satisfactory in October.   I will follow up in 6 months.

## 2019-01-29 ENCOUNTER — Ambulatory Visit: Payer: Medicare Other | Admitting: Cardiology

## 2019-01-29 ENCOUNTER — Telehealth: Payer: Self-pay | Admitting: Cardiology

## 2019-01-29 NOTE — Telephone Encounter (Signed)
New Message    Pt is calling and would like for Dr Morrison Old nurse to call him back back. He says he forgot his appt today and says he didn't get a reminder call.  He is wondering if he can be seen by Dr Martinique Before Feb.    Please call

## 2019-03-10 NOTE — Progress Notes (Signed)
[place] Date of Birth: 1921-02-07 Medical Record [id number]  History of Present Illness: Gregory [name] is seen for cardiac follow up. He has a history of atrial flutter and pacemaker. Much of his cardiac care had been at the [facility] clinic in [place]. He reports that he had significant bradycardia in 2002 and had a pacemaker placed (Medtronic). This was revised in 2010.  He has a history of atrial flutter and had a successful ablation in the past.   When  seen by me in January 2018  he was found to be in atrial flutter and was tracking this at a high rate. He was started on Eliquis. Seen by Dr. [name] and pacemaker reprogrammed so he wouldn't track as fast. Attempted to pace terminate his Aflutter without success. When seen later in April 2018 he was found to be back in NSR. Last generator change at [facility] in May 2020.   In 11-25-18 he underwent TAVR with a #26 Sapien S3 Ultra valve in [place], [place]. Cardiac cath at that time showed obstructive disease in the distal RCA treated medically. Follow up Echo in October 2020 was satisfactory. He has remained in Afib. Pacemaker check in November with Dr [name] showed normal function.   He is now staying in [name] primarily. His wife passed away in 11-25-22. He is seen with his daughter.  He still takes care of multiple rental properties.   He denies any chest pain, dyspnea, or palpitations. He feels a little lightheaded/ dizziness when he sits up. Overall feels well. He has been experiencing nosebleeds.   Allergies as of 03/19/2019      Reactions   Penicillin G Rash   Penicillins Hives, Rash      Medication List       Accurate as of March 19, 2019  1:49 PM. If you have any questions, ask your nurse or doctor.        apixaban 2.5 MG Tabs tablet Commonly known as: Eliquis Take 1 tablet (2.5 mg total) by mouth 2 (two) times daily. What changed: Another medication with the same name was removed. Continue  taking this medication, and follow the directions you see here. Changed by: Mariame Rybolt Martinique, MD   CALCIUM PO Take 1 tablet by mouth daily.   cholecalciferol 1000 units tablet Commonly known as: VITAMIN D Take 3,000 Units by mouth daily.   Coenzyme Q10 200 MG Tabs Take 200 mg by mouth daily.   DSS 100 MG Caps Take by mouth.   ferrous sulfate 325 (65 FE) MG EC tablet Take by mouth.   levothyroxine 75 MCG tablet Commonly known as: SYNTHROID Take 75 mcg by mouth daily before breakfast.   levothyroxine 75 MCG tablet Commonly known as: SYNTHROID Take by mouth.   MAGNESIUM PO Take 1-2 tablets by mouth daily.   POTASSIUM CHLORIDE PO Take 1 tablet by mouth daily.   vitamin B-12 1000 MCG tablet Commonly known as: CYANOCOBALAMIN Take 1,000 mcg by mouth daily.   vitamin C 500 MG tablet Commonly known as: ASCORBIC ACID Take 1,500 mg by mouth daily. What changed: Another medication with the same name was removed. Continue taking this medication, and follow the directions you see here. Changed by: Amiera Herzberg Martinique, MD        Allergies  Allergen Reactions  . Penicillin G Rash  . Penicillins Hives and Rash    Past Medical History:  Diagnosis Date  . Atrial flutter ([place])    s/p ablation  .  Cancer ([place])    skin cancer  . Cataracts, bilateral   . Hypertension   . Neuromuscular disorder ([place])   . Pacemaker 2010  . Thyroid disease     Past Surgical History:  Procedure Laterality Date  . APPENDECTOMY    . COLONOSCOPY    . EYE SURGERY Bilateral    cataract surgery  . HERNIA REPAIR Right   . INGUINAL HERNIA REPAIR Left 10/12/2013   Procedure: LEFT  INGUINAL HERNIA REPAIR;  Surgeon: Odis Hollingshead, MD;  Location: [facility];  Service: General;  Laterality: Left;  . INSERTION OF MESH Left 10/12/2013   Procedure: INSERTION OF MESH;  Surgeon: Odis Hollingshead, MD;  Location: [place] [facility];  Service: General;  Laterality: Left;    Social History   Socioeconomic History  . Marital  status: Married    Spouse name: Not on file  . Number of children: 2  . Years of education: Not on file  . Highest education level: Not on file  Occupational History  . Occupation: retired Actor  Tobacco Use  . Smoking status: Never Smoker  . Smokeless tobacco: Never Used  Substance and Sexual Activity  . Alcohol use: No  . Drug use: No  . Sexual activity: Never  Other Topics Concern  . Not on file  Social History Narrative  . Not on file   Social Determinants of Health   Financial Resource Strain:   . Difficulty of Paying Living Expenses: Not on file  Food Insecurity:   . Worried About Charity fundraiser in the Last Year: Not on file  . Ran Out of Food in the Last Year: Not on file  Transportation Needs:   . Lack of Transportation (Medical): Not on file  . Lack of Transportation (Non-Medical): Not on file  Physical Activity:   . Days of Exercise per Week: Not on file  . Minutes of Exercise per Session: Not on file  Stress:   . Feeling of Stress : Not on file  Social Connections:   . Frequency of Communication with Friends and Family: Not on file  . Frequency of Social Gatherings with Friends and Family: Not on file  . Attends Religious Services: Not on file  . Active Member of Clubs or Organizations: Not on file  . Attends Archivist Meetings: Not on file  . Marital Status: Not on file    Family History  Problem Relation Age of Onset  . Heart disease Mother   . Cancer Father        leukemia    Review of Systems: As noted in HPI.  All other systems were reviewed and are negative.  Physical Exam: BP (!) 144/69   Pulse 71   Temp 98.2 F (36.8 C)   Ht 5\' 9"  (1.753 m)   Wt 146 lb 8 oz (66.5 kg)   SpO2 99%   BMI 21.63 kg/m  Filed Weights   03/19/19 1321  Weight: 146 lb 8 oz (66.5 kg)    GENERAL:  Well appearing, elderly WM in NAD HEENT:  PERRL, EOMI, sclera are clear. Oropharynx is clear. NECK:  No jugular venous distention, carotid  upstroke brisk and symmetric, no bruits, no thyromegaly or adenopathy LUNGS:  Clear to auscultation bilaterally CHEST:  Unremarkable HEART:  RRR,  PMI not displaced or sustained,S1 and S2 within normal limits, no S3, no S4: no clicks, no rubs, he has soft 1/6 systolic murmur RUSB. ABD:  Soft, nontender. BS +, no  masses or bruits. No hepatomegaly, no splenomegaly EXT:  2 + pulses throughout, no edema, no cyanosis no clubbing SKIN:  Warm and dry.  No rashes NEURO:  Alert and oriented x 3. Cranial nerves II through XII intact. PSYCH:  Cognitively intact    LABORATORY DATA: Lab Results  Component Value Date   WBC 8.9 02/21/2016   HGB 10.4 (L) 02/21/2016   HCT 31.7 (L) 02/21/2016   PLT 229 02/21/2016   GLUCOSE 109 (H) 02/21/2016   ALT 13 02/21/2016   AST 20 02/21/2016   NA 135 02/21/2016   K 4.5 02/21/2016   CL 100 02/21/2016   CREATININE 1.26 (H) 02/21/2016   BUN 27 (H) 02/21/2016   CO2 27 02/21/2016   TSH 2.90 02/21/2016   INR 1.04 10/02/2013     Labs dated 04/19/15: cholesterol 153, triglycerides 50, LDL 87, HDL 56. Bun 29, creatinine 1.19. Other CMET normal. Hgb 10.6. Dated 05/23/16: creatinine 1.12. Other chemistries normal. Dated 05/24/17: cholesterol 168, triglycerides 65, HDL 65, LDL 90. Hgb 10.6. Creatinine 1.27. Other chemistries and TSH normal.  Dated 11/15/17: Hgb 10.2. CMET normal. Folate and B12 normal. TSH normal. Dated 09/05/18: cholesterol 178, triglycerides 39, HDL 76, LDL 94.  Dated 12/26/18: Hgb 9.4. creatinine 1.2.   Ecg today shows V paced rhythm at 71 bpm. I have personally reviewed and interpreted this study.   Cardiac cath 09/24/18: PROCEDURE TYPES 1. CORONARY ANGIOGRAPHY 2. ULTRASOUND GUIDANCE FOR VASCULAR ACCESS FINAL DIAGNOSIS 1. Moderate coronary artery atherosclerosis PRE-PROCEDURE DIAGNOSIS 1. Stenosis Aortic Valve Acquired CORONARY DIAGNOSTIC SUMMARY Coronary artery dominance is right. Normal left main coronary.  The left main coronary  artery is 10% obstructed by a discrete lesion.  The proximal left anterior descending artery is 30% obstructed by a discrete lesion. The distal segment is normal size, diseased.  The distal circumflex artery is 50% obstructed by multiple discrete lesions. The distal segment is normal size, diseased.  The middle right coronary artery is 50% obstructed by multiple discrete lesions. The distal segment is normal size, diseased.  The distal right coronary artery is 90% obstructed by a discrete lesion. The distal segment is normal size, diseased.  The right posterior descending artery is 90% obstructed by a discrete lesion. The distal segment is large size.  RADIATION DOSE DATA Procedure cumulative skin dose (mGy): 261.00 Procedure cumulative dose area product (Gy-cm**2): 20.71 Fluoro Time (Min): 9.00 CONTRAST DOSE DATA IOHEXOL 350 MG IODINE/ML INTRAVENOUS SOLUTION: 1105mL  Echo 12/03/18: Final Impressions 1. ECHOCARDIOGRAM 2. Goal directed echocardiogram 30 day s/p TAVR. 3. LEFT VENTRICLE: 4. Normal left ventricular chamber size. 5. Borderline increased concentric left ventricular wall thickness. 6. Calculated 2-D linear left ventricular ejection fraction 62 %. 7. CARDIAC VALVES: 8. Normal tissue aortic prosthesis. 9. Aortic valve systolic mean Doppler gradient 11 mmHg. 10. Aortic valve peak systolic velocity is, 2.2 m/sec. 11. Trivial aortic valve prosthetic regurgitation. 12. Moderate-severe tricuspid valve regurgitation (no change compared to echo on 08/20/2018). 13. Pacemaker catheter visualized. 14. No pericardial effusion. 15. Compared to the report of 10/29/2018 the following changes have occurred: The prosthetic aortic velocity has increased slightly.  Assessment / Plan: 1. History of bradycardia s/p permanent pacemaker. Good pacemaker function in October. Generator change out this past year 2. History of atrial flutter s/p ablation. Now with permanent Atrial fibrillation. He is  asymptomatic. [place] Vasc score of 2 based on age. No history of bleeding or CVA. I have recommended continuing  Eliquis 2.5 mg  Bid. He is 89 and is  borderline on renal function and weight.  3. History of aortic stenosis. S/p TAVR in September 2020. Follow up Echo in October 2020 was satisfactory 4. CAD with 90% distal RCA and PDA disease. Treated medically. Asymptomatic.     I will follow up in 6 months.

## 2019-03-16 ENCOUNTER — Telehealth: Payer: Self-pay | Admitting: Cardiology

## 2019-03-16 NOTE — Telephone Encounter (Signed)
It appears to me that appropriate dosing is 5 mg bid of Eliquis. Should take precautions for nosebleeds including avoiding fingers in the nose or blowing nose heavily. May need humidifier with dry heat.  Brynley Cuddeback Martinique MD, [facility]

## 2019-03-16 NOTE — Telephone Encounter (Signed)
Patient's daughter, [name], states patient was advised by [facility] to to increase daily dosage of apixaban (ELIQUIS)  Medication from 5 MG to 10 MG. Patient's daughter states that the patient's intake of apixaban (ELIQUIS)  Medication is causing the patient to experience bleeding in the head, leading to nose bleeds. Please return call to discuss.

## 2019-03-17 ENCOUNTER — Telehealth: Payer: Self-pay

## 2019-03-17 NOTE — Telephone Encounter (Signed)
I called the pt to see if he was feeling okay because we received 4 transmissions on 03-13-2019. The pt states he thought he missed his remote appointment but he was feeling good.   While on the phone the Gregory [name] feel like 10 mg of Eliquis a day is too much because he is waking up with his nose bleeding. I explained to him per Dr. Martinique phone note that the 5 mg bid is appropriate.  I also told him that Dr. Martinique suggest using a humidifier with dry heat. The pt would like a phone call from Dr. Martinique nurse because he has more questions about the Eliquis.

## 2019-03-18 NOTE — Telephone Encounter (Signed)
Spoke to patient's daughter Gregory [name] 2/1.Dr.Jordan's recommendation given.Advised she can come back with father at appointment with Dr.Jordan 2/4 at 1:20 pm.

## 2019-03-18 NOTE — Telephone Encounter (Signed)
Spoke to patient Dr.Jordan's recommendation given.Advised to keep appointment with Dr.Jordan 2/4 at 1:20 pm.Advised ok for your daughter [name] to come back at appointment.

## 2019-03-19 ENCOUNTER — Encounter: Payer: Self-pay | Admitting: Cardiology

## 2019-03-19 ENCOUNTER — Ambulatory Visit (INDEPENDENT_AMBULATORY_CARE_PROVIDER_SITE_OTHER): Payer: Medicare Other | Admitting: Cardiology

## 2019-03-19 ENCOUNTER — Other Ambulatory Visit: Payer: Self-pay

## 2019-03-19 VITALS — BP 144/69 | HR 71 | Temp 98.2°F | Ht 69.0 in | Wt 146.5 lb

## 2019-03-19 DIAGNOSIS — I4811 Longstanding persistent atrial fibrillation: Secondary | ICD-10-CM

## 2019-03-19 DIAGNOSIS — Z950 Presence of cardiac pacemaker: Secondary | ICD-10-CM

## 2019-03-19 DIAGNOSIS — I442 Atrioventricular block, complete: Secondary | ICD-10-CM | POA: Diagnosis not present

## 2019-03-19 DIAGNOSIS — I350 Nonrheumatic aortic (valve) stenosis: Secondary | ICD-10-CM | POA: Diagnosis not present

## 2019-03-19 MED ORDER — APIXABAN 2.5 MG PO TABS: 2.5000 mg | ORAL_TABLET | Freq: Two times a day (BID) | ORAL | 3 refills | Status: DC

## 2019-03-19 NOTE — Patient Instructions (Signed)
Reduce Eliquis back to 2.5 mg twice a day  Follow up in 6 months.

## 2019-04-07 ENCOUNTER — Ambulatory Visit (INDEPENDENT_AMBULATORY_CARE_PROVIDER_SITE_OTHER): Payer: Medicare Other | Admitting: *Deleted

## 2019-04-07 DIAGNOSIS — R001 Bradycardia, unspecified: Secondary | ICD-10-CM | POA: Diagnosis not present

## 2019-04-07 LAB — CUP PACEART REMOTE DEVICE CHECK
Battery Remaining Longevity: 147 mo
Battery Voltage: 3.12 V
Brady Statistic RV Percent Paced: 99.66 %
Date Time Interrogation Session: 20210222005858
Implantable Lead Implant Date: 20021114
Implantable Lead Location: 753860
Implantable Lead Model: 5076
Implantable Pulse Generator Implant Date: 20200506
Lead Channel Impedance Value: 342 Ohm
Lead Channel Impedance Value: 399 Ohm
Lead Channel Pacing Threshold Amplitude: 0.875 V
Lead Channel Pacing Threshold Pulse Width: 0.4 ms
Lead Channel Sensing Intrinsic Amplitude: 9.25 mV
Lead Channel Sensing Intrinsic Amplitude: 9.25 mV
Lead Channel Setting Pacing Amplitude: 2 V
Lead Channel Setting Pacing Pulse Width: 0.4 ms
Lead Channel Setting Sensing Sensitivity: 0.9 mV

## 2019-04-07 NOTE — Progress Notes (Signed)
PPM Remote  

## 2019-07-07 ENCOUNTER — Ambulatory Visit (INDEPENDENT_AMBULATORY_CARE_PROVIDER_SITE_OTHER): Payer: Medicare Other | Admitting: *Deleted

## 2019-07-07 DIAGNOSIS — I4892 Unspecified atrial flutter: Secondary | ICD-10-CM

## 2019-07-07 LAB — CUP PACEART REMOTE DEVICE CHECK
Battery Remaining Longevity: 144 mo
Battery Voltage: 3.07 V
Brady Statistic RV Percent Paced: 99.65 %
Date Time Interrogation Session: 20210525015946
Implantable Lead Implant Date: 20021114
Implantable Lead Location: 753860
Implantable Lead Model: 5076
Implantable Pulse Generator Implant Date: 20200506
Lead Channel Impedance Value: 342 Ohm
Lead Channel Impedance Value: 399 Ohm
Lead Channel Pacing Threshold Amplitude: 0.875 V
Lead Channel Pacing Threshold Pulse Width: 0.4 ms
Lead Channel Sensing Intrinsic Amplitude: 14 mV
Lead Channel Sensing Intrinsic Amplitude: 14 mV
Lead Channel Setting Pacing Amplitude: 2 V
Lead Channel Setting Pacing Pulse Width: 0.4 ms
Lead Channel Setting Sensing Sensitivity: 0.9 mV

## 2019-07-07 NOTE — Progress Notes (Signed)
Remote pacemaker transmission.   

## 2019-10-05 ENCOUNTER — Encounter: Payer: Self-pay | Admitting: Speech Pathology

## 2019-10-05 ENCOUNTER — Ambulatory Visit: Payer: Medicare Other | Attending: Internal Medicine | Admitting: Speech Pathology

## 2019-10-05 ENCOUNTER — Other Ambulatory Visit: Payer: Self-pay

## 2019-10-05 DIAGNOSIS — R1313 Dysphagia, pharyngeal phase: Secondary | ICD-10-CM | POA: Diagnosis not present

## 2019-10-05 NOTE — Therapy (Signed)
[facility] [address] [place], [place], [address] Phone: [phone]   Fax:  [phone]  Speech Language Pathology Evaluation  Patient Details  Name: Gregory [name] MRN: [id number] Date of Birth: [birth year] Referring Provider (SLP): Dr. Brynda Peon   Encounter Date: 10/05/2019   End of Session - 10/05/19 1510    Visit Number 1    Number of Visits 17    Date for SLP Re-Evaluation 11/30/19    SLP Start Time 83    SLP Stop Time  1359    SLP Time Calculation (min) 39 min    Activity Tolerance Patient tolerated treatment well           Past Medical History:  Diagnosis Date  . Atrial flutter ([place])    s/p ablation  . Cancer ([place])    skin cancer  . Cataracts, bilateral   . Hypertension   . Neuromuscular disorder ([place])   . Pacemaker 2010  . Thyroid disease     Past Surgical History:  Procedure Laterality Date  . APPENDECTOMY    . COLONOSCOPY    . EYE SURGERY Bilateral    cataract surgery  . HERNIA REPAIR Right   . INGUINAL HERNIA REPAIR Left 10/12/2013   Procedure: LEFT  INGUINAL HERNIA REPAIR;  Surgeon: Odis Hollingshead, MD;  Location: [facility];  Service: General;  Laterality: Left;  . INSERTION OF MESH Left 10/12/2013   Procedure: INSERTION OF MESH;  Surgeon: Odis Hollingshead, MD;  Location: [facility];  Service: General;  Laterality: Left;    There were no vitals filed for this visit.   Subjective Assessment - 10/05/19 1459    Subjective "He had spots on his lungs"    Patient is accompained by: Family member   daughter   Currently in Pain? No/denies              SLP Evaluation [facility] - 10/05/19 1327      SLP Visit Information   SLP Received On 10/05/19    Referring Provider (SLP) Dr. Brynda Peon    Onset Date 08/18/19    Medical Diagnosis oropharyngeal dysphagia      Subjective   Subjective "My mom died from apsiration pna on both lungs"    Patient/Family Stated Goal to not get  aspiration pna      Pain Assessment   Currently in Pain? No/denies      General Information   HPI MBSS 08/18/19 at [facility]: a pharyngeal swallow response that initiated after 2 seconds in the vallecuale with all administered consistencies;Slightly decreased airway protection as evidenced by reduced laryngeal closure and epiglottic retroversion decreased BOT retraction, hyo-laryngeal excursion, and pharyngeal constriction as evidenced by moderate-severe residue in the valleculae and pyriform sinuses with all administered consistencies. Reduced amount of pharyngeal residue noted when patient implemented a chin tuck posture with the swallow; SILENT laryngeal penetration of thin liquids after the swallow, due to pharyngeal residue spilling in to laryngeal vestibule upon subsequent swallows No aspiration visualized during this exam however, patient at increased risk during the course of a meal given moderate-severe amounts of post-swallow pharyngeal residue.    Mobility Status walks independently      Balance Screen   Has the patient fallen in the past 6 months No    Has the patient had a decrease in activity level because of a fear of falling?  No    Is the patient reluctant to leave their home because of a fear  of falling?  No      Prior Functional Status   Cognitive/Linguistic Baseline Baseline deficits    Baseline deficit details memory    Type of Home House     Lives With Son    Available Support Family    Vocation Retired      Associate Professor   Overall Cognitive Status History of cognitive impairments - at baseline    Memory Impaired    Memory Impairment Decreased recall of new information;Decreased short term memory    Awareness Impaired    Awareness Impairment Intellectual impairment      Auditory Comprehension   Overall Auditory Comprehension Appears within functional limits for tasks assessed    Interfering Components Hearing      Verbal Expression   Overall Verbal Expression Appears  within functional limits for tasks assessed      Oral Motor/Sensory Function   Overall Oral Motor/Sensory Function Appears within functional limits for tasks assessed      Motor Speech   Overall Motor Speech Impaired    Respiration Impaired    Level of Impairment Sentence    Phonation Aphonic;Hoarse;Low vocal intensity    Articulation Impaired    Level of Impairment Phrase    Intelligibility Intelligibility reduced    Word 75-100% accurate    Phrase 75-100% accurate    Sentence 75-100% accurate    Conversation 50-74% accurate    Motor Planning Witnin functional limits                           SLP Education - 10/05/19 1508    Education Details swallow precautions, diet recommendations; s/s of aspiration pna; HEP for swallow    Person(s) Educated Patient;Child(ren)    Methods Explanation;Demonstration;Tactile cues;Verbal cues;Handout    Comprehension Verbalized understanding;Returned demonstration;Verbal cues required;Need further instruction            SLP Short Term Goals - 10/05/19 1518      SLP SHORT TERM GOAL #1   Title pt will perform Inspiratory Muscle Training (IMT) at 28 cm H2O with 100% accuracy over 30 trials    Time 4    Period Weeks    Status New      SLP SHORT TERM GOAL #2   Title pt will perform Expiratory Muscle Training (EMT) at 45 cm H2O with 100% accuracy over 30 trials.    Time 4    Period Weeks    Status New      SLP SHORT TERM GOAL #3   Title Pt will follow swallow precations with occasional min A 10/10 boluses with occasional min A over 2 sessions    Time 4    Period Weeks    Status New            SLP Long Term Goals - 10/05/19 1520      SLP LONG TERM GOAL #1   Title Pt will complete HEP for dysphagia with occasional min A over 3 sessions    Baseline CTAR, hyolaryngeal elevation, BOT,    Time 8    Period Weeks    Status New      SLP LONG TERM GOAL #2   Title Pt and daughter will report carryover of swallow  precautions with occasional min A over 2 sessions    Time 8    Period Weeks    Status New            Plan - 10/05/19 1523  Clinical Impression Statement [name] is referred for outpt ST for dysphagia therapy. CXR revealed chronic aspiration, MBSS completed at [facility]. Pt elects to attend ST in [place] where he lives. Daughter present. Reviewed recommendations and results of MBSS. At this time, Gregory [name] is not following swallow precautions. Clinical bedside evaluation with Dys 3 and thin liquids revealed no chin tuck or multiple swallows after solids or liquids. He consistently took water sip with soft solid still in his mouth. Reduce airway protection with delayed cough and throat clear. Daughter reports Terren extends and turns his head to get his meds to the back of his mouth. She also reports that he speaks softly, mumbles and "no one can understand him." At [facility]: Pressure Gauge Digital Manometry was used to obtain the pt's maximum inspiratory and expiratory pressure (MIP, MEP) as outlined below. Values over 3 Trials (cm H2O) Peak Value (cm H2O) Prior measurements(cm H2O)Predicted Value based on normative data (cm H2O) % Predicted based on normative data Results MIP 79,15,04 42 n/a 81 52% InadequateAdequate is >70% of predicted MEP 53,66,52 66 n/a 94 70% adequateAdequate is >70% of predicted.I recommend skilled ST to maximize safety of swallow and reduce risk of aspiration pna.   I initiated training in swallow precautions. With frequent mod verbal cues and modeling, as well as placing hand over cup, Gregory [name] followed precautions of chin tuck, 3 swallows, and alternate solid/liquid. Initiated training of effortful swallow, Mendelsohn, and pitch glide with cues.    Speech Therapy Frequency 2x / week    Duration --   8 weeks or 17 visits   Treatment/Interventions Aspiration precaution training;Pharyngeal strengthening exercises;Diet toleration management by SLP;Trials of upgraded  texture/liquids;Internal/external aids;Environmental controls;Cueing hierarchy;Oral motor exercises;SLP instruction and feedback;Functional tasks;Compensatory strategies;Compensatory techniques;Other (comment);Patient/family education    Potential to Achieve Goals Fair    Potential Considerations Ability to learn/carryover information;Severity of impairments           Patient will benefit from skilled therapeutic intervention in order to improve the following deficits and impairments:   Dysphagia, pharyngeal phase    Problem List Patient Active Problem List   Diagnosis Date Noted  . Aortic stenosis, mild 10/15/2014  . Bradycardia 09/08/2013  . Pacemaker 09/08/2013  . Pre-operative cardiovascular examination 09/08/2013  . Inguinal hernia without mention of obstruction or gangrene, unilateral or unspecified, (not specified as recurrent)-left 08/11/2013  . Atrial flutter ([place]) 08/11/2013    Gregory [name], Gregory Rusk MS, CCC-SLP 10/05/2019, 3:27 PM  [facility] [address] [place], [place], [address] Phone: [phone]   Fax:  [phone]  Name: Gregory [name] MRN: [id number] Date of Birth: Apr 20, 1920

## 2019-10-05 NOTE — Patient Instructions (Signed)
   Chin down and 3 swallows after each bite and sip  Don't take water (liquid) until after you swallow your bite 3x  Alternate solids and liquids  Do 3x a day  Hard swallow - 10x  Half swallow - swallow, then use your neck muscles to hold up your voice box  Hold for 3 seconds1 10x  Pitch glide up then down 10x  Hold tongue with cloth, try to pull it back in  Signs of Aspiration Pneumonia   . Chest pain/tightness . Fever (can be low grade) . Cough  o With foul-smelling phlegm (sputum) o With sputum containing pus or blood o With greenish sputum . Fatigue  . Shortness of breath  . Wheezing   **IF YOU HAVE THESE SIGNS, CONTACT YOUR DOCTOR OR GO TO THE EMERGENCY DEPARTMENT OR URGENT CARE AS SOON AS POSSIBLE**

## 2019-10-06 ENCOUNTER — Ambulatory Visit (INDEPENDENT_AMBULATORY_CARE_PROVIDER_SITE_OTHER): Payer: Medicare Other | Admitting: *Deleted

## 2019-10-06 DIAGNOSIS — I4892 Unspecified atrial flutter: Secondary | ICD-10-CM | POA: Diagnosis not present

## 2019-10-06 LAB — CUP PACEART REMOTE DEVICE CHECK
Battery Remaining Longevity: 142 mo
Battery Voltage: 3.05 V
Brady Statistic RV Percent Paced: 99.97 %
Date Time Interrogation Session: 20210824020126
Implantable Lead Implant Date: 20021114
Implantable Lead Location: 753860
Implantable Lead Model: 5076
Implantable Pulse Generator Implant Date: 20200506
Lead Channel Impedance Value: 342 Ohm
Lead Channel Impedance Value: 418 Ohm
Lead Channel Pacing Threshold Amplitude: 0.875 V
Lead Channel Pacing Threshold Pulse Width: 0.4 ms
Lead Channel Sensing Intrinsic Amplitude: 8.5 mV
Lead Channel Sensing Intrinsic Amplitude: 8.5 mV
Lead Channel Setting Pacing Amplitude: 2 V
Lead Channel Setting Pacing Pulse Width: 0.4 ms
Lead Channel Setting Sensing Sensitivity: 0.9 mV

## 2019-10-12 NOTE — Progress Notes (Signed)
Remote pacemaker transmission.   

## 2019-10-13 ENCOUNTER — Ambulatory Visit: Payer: Medicare Other

## 2019-10-13 ENCOUNTER — Other Ambulatory Visit: Payer: Self-pay

## 2019-10-13 DIAGNOSIS — R1313 Dysphagia, pharyngeal phase: Secondary | ICD-10-CM

## 2019-10-13 NOTE — Therapy (Signed)
[facility] [address] [place], [place], [address] Phone: [phone]   Fax:  [phone]  Speech Language Pathology Treatment  Patient Details  Name: Gregory [name] MRN: [id number] Date of Birth: 10-08-1920 Referring Provider (SLP): Dr. Brynda Peon   Encounter Date: 10/13/2019   End of Session - 10/13/19 1608    Visit Number 2    Number of Visits 17    Date for SLP Re-Evaluation 11/30/19    SLP Start Time 1450    SLP Stop Time  1530    SLP Time Calculation (min) 40 min    Activity Tolerance Patient tolerated treatment well           Past Medical History:  Diagnosis Date  . Atrial flutter ([place])    s/p ablation  . Cancer ([place])    skin cancer  . Cataracts, bilateral   . Hypertension   . Neuromuscular disorder ([place])   . Pacemaker 2010  . Thyroid disease     Past Surgical History:  Procedure Laterality Date  . APPENDECTOMY    . COLONOSCOPY    . EYE SURGERY Bilateral    cataract surgery  . HERNIA REPAIR Right   . INGUINAL HERNIA REPAIR Left 10/12/2013   Procedure: LEFT  INGUINAL HERNIA REPAIR;  Surgeon: Odis Hollingshead, MD;  Location: [facility];  Service: General;  Laterality: Left;  . INSERTION OF MESH Left 10/12/2013   Procedure: INSERTION OF MESH;  Surgeon: Odis Hollingshead, MD;  Location: [facility];  Service: General;  Laterality: Left;    There were no vitals filed for this visit.   Subjective Assessment - 10/13/19 1602    Subjective (SLP: "what do you recall from last session?") "Oh, just my swallowing. (request for more details from SLP) She showed me how to swallow." Pt unable to provide more detail upon further request.    Currently in Pain? No/denies                 ADULT SLP TREATMENT - 10/13/19 1504      General Information   Behavior/Cognition Alert;Cooperative;Pleasant mood;Hard of hearing      Treatment Provided   Treatment provided Dysphagia      Dysphagia Treatment    Temperature Spikes Noted No    Respiratory Status Room air    Oral Cavity - Dentition Adequate natural dentition    Treatment Methods Skilled observation;Compensation strategy training;Patient/caregiver education    Patient observed directly with PO's Yes    Type of PO's observed Dysphagia 3 (soft);Thin liquids    Liquids provided via Cup    Oral Phase Signs & Symptoms --   none noted   Pharyngeal Phase Signs & Symptoms Suspected delayed swallow initiation    Type of cueing Verbal   written cues for sequencing bite- x2 swallow, sip-x2 swallow   Amount of cueing --   mod usual cues faded to min cues rarely   Other treatment/comments SLP reviewed pt's swallow precautions; "She never showed me alternating bites and sips or swallowing a third time." SLP looked at previous SLP notes and pt was provided with these via handout, at least. Pt with cues consistently necessary for following precautions, faded to rare min A for precautions by end of POs (6 bite/sip sequences).  SLP continually reviewed precautions as pt continued with POs, giving rationale for them. Pt stated he had not done exercises once.  If pt is not following precautions at home and is not doing exercises,  consideration may be made for d/c in next 1-2 sessions.      Assessment / Recommendations / Plan   Plan Continue with current plan of care      Dysphagia Recommendations   Diet recommendations Regular;Thin liquid    Compensations Multiple dry swallows after each bite/sip;Follow solids with liquid    Postural Changes and/or Swallow Maneuvers Chin tuck      Progression Toward Goals   Progression toward goals Progressing toward goals            SLP Education - 10/13/19 1607    Education Details swallow precautions and rationale, rationale for exercises, must do exercises every day    Person(s) Educated Patient    Methods Explanation;Demonstration;Verbal cues;Handout    Comprehension Verbalized understanding;Returned  demonstration;Verbal cues required;Need further instruction            SLP Short Term Goals - 10/13/19 1611      SLP SHORT TERM GOAL #1   Title pt will perform Inspiratory Muscle Training (IMT) at 28 cm H2O with 100% accuracy over 30 trials    Time 4    Period Weeks    Status On-going      SLP SHORT TERM GOAL #2   Title pt will perform Expiratory Muscle Training (EMT) at 45 cm H2O with 100% accuracy over 30 trials.    Time 4    Period Weeks    Status On-going      SLP SHORT TERM GOAL #3   Title Pt will follow swallow precations with occasional min A 10/10 boluses with occasional min A over 2 sessions    Time 4    Period Weeks    Status On-going            SLP Long Term Goals - 10/13/19 1613      SLP LONG TERM GOAL #1   Title Pt will complete HEP for dysphagia with occasional min A over 3 sessions    Baseline CTAR, hyolaryngeal elevation, BOT,    Time 8    Period Weeks    Status On-going      SLP LONG TERM GOAL #2   Title Pt and daughter will report carryover of swallow precautions with occasional min A over 2 sessions    Time 8    Period Weeks    Status On-going            Plan - 10/13/19 1608    Clinical Impression Statement [name] is referred for outpt ST for dysphagia therapy. CXR revealed chronic aspiration, MBSS completed at [facility]. Pt elects to attend ST in [place] where he lives. As pt did not recall any precautions, nor any specific details from last ST session. SLP reviewed precautions and provided rationale. Suspect pt is not following swallow precautions at home at this time. No overt s/s aspiration noted today. Noted that x2/week was recommended however pt is scheduled only x1/week. MIP/MEP values follow: 3 Trials (cm H2O) Peak Value (cm H2O) Prior measurements(cm H2O)Predicted Value based on normative data (cm H2O) % Predicted based on normative data Results MIP 42,35,36 42 n/a 81 52% InadequateAdequate is >70% of predicted MEP 53,66,52 66  n/a 94 70% adequateAdequate is >70% of predicted. I recommend cont'd skilled ST to maximize safety of swallow and reduce risk of aspiration pna, however if pt is not using precautions and not completing HEP at home, consideration may wnat to be made for d/c in next 1-2 sessions.    Speech Therapy  Frequency 2x / week    Duration --   8 weeks or 17 visits   Treatment/Interventions Aspiration precaution training;Pharyngeal strengthening exercises;Diet toleration management by SLP;Trials of upgraded texture/liquids;Internal/external aids;Environmental controls;Cueing hierarchy;Oral motor exercises;SLP instruction and feedback;Functional tasks;Compensatory strategies;Compensatory techniques;Other (comment);Patient/family education    Potential to Achieve Goals Fair    Potential Considerations Ability to learn/carryover information;Severity of impairments           Patient will benefit from skilled therapeutic intervention in order to improve the following deficits and impairments:   Dysphagia, pharyngeal phase    Problem List Patient Active Problem List   Diagnosis Date Noted  . Aortic stenosis, mild 10/15/2014  . Bradycardia 09/08/2013  . Pacemaker 09/08/2013  . Pre-operative cardiovascular examination 09/08/2013  . Inguinal hernia without mention of obstruction or gangrene, unilateral or unspecified, (not specified as recurrent)-left 08/11/2013  . Atrial flutter ([place]) 08/11/2013    [facility] ,MS, CCC-SLP  10/13/2019, 4:13 PM  [facility] [address] [place], [place], [address] Phone: [phone]   Fax:  [phone]   Name: Gregory [name] MRN: [id number] Date of Birth: 12-Feb-1921

## 2019-10-13 NOTE — Patient Instructions (Addendum)
    Follow these instructions every time you eat. It keeps food and liquids out of your windpipe. ==================================  KEEP YOUR CHIN DOWN FOR EVERY SWALLOW  *Bite - chew  Swallow  Swallow again  *Swallow again*  *Sip - swallow  Swallow again  *Swallow again*  (repeat sequence as needed)   ============================  DO your exercises Mickel Baas gave you last visit!   Swallow precautions:  Chin down for all swallows  Alternate bites and sips  Swallow 3x for each bite/sip

## 2019-10-15 ENCOUNTER — Ambulatory Visit: Payer: Medicare Other | Admitting: Speech Pathology

## 2019-10-22 ENCOUNTER — Ambulatory Visit: Payer: Medicare Other | Attending: Internal Medicine | Admitting: Speech Pathology

## 2019-10-22 ENCOUNTER — Other Ambulatory Visit: Payer: Self-pay

## 2019-10-22 DIAGNOSIS — R1313 Dysphagia, pharyngeal phase: Secondary | ICD-10-CM | POA: Diagnosis not present

## 2019-10-22 NOTE — Patient Instructions (Signed)
Every time you eat:  Bite: Chew  Chin down  Swallow   Swallow  Swallow  Sip: Chin down  Swallow   Swallow   Swallow ___________________________________________________________ Exercises to make your throat muscles stronger: Do 3x a day  Hard swallow - 10x  Half swallow - swallow, then use your neck muscles to hold up your voice box  Hold for 3 seconds1 10x  Pitch glide up then down 10x  Hold tongue with cloth, try to pull it back in ____________________________________________________________________________________________________  Signs of Aspiration Pneumonia    Chest pain/tightness  Fever (can be low grade)  Cough  ? With foul-smelling phlegm (sputum) ? With sputum containing pus or blood ? With greenish sputum  Fatigue   Shortness of breath   Wheezing   **IF YOU HAVE THESE SIGNS, CONTACT YOUR DOCTOR OR GO TO THE EMERGENCY DEPARTMENT OR URGENT CARE AS SOON AS POSSIBLE**

## 2019-10-22 NOTE — Therapy (Signed)
[facility] [address] [place], [place], [address] Phone: [phone]   Fax:  [phone]  Speech Language Pathology Treatment  Patient Details  Name: Gregory [name] MRN: [id number] Date of Birth: [birth year] Referring Provider (SLP): Dr. Brynda Peon   Encounter Date: 10/22/2019   End of Session - 10/22/19 1734    Visit Number 3    Number of Visits 17    Date for SLP Re-Evaluation 11/30/19    SLP Start Time 1533    SLP Stop Time  1615    SLP Time Calculation (min) 42 min    Activity Tolerance Patient tolerated treatment well           Past Medical History:  Diagnosis Date  . Atrial flutter ([place])    s/p ablation  . Cancer ([place])    skin cancer  . Cataracts, bilateral   . Hypertension   . Neuromuscular disorder ([place])   . Pacemaker 2010  . Thyroid disease     Past Surgical History:  Procedure Laterality Date  . APPENDECTOMY    . COLONOSCOPY    . EYE SURGERY Bilateral    cataract surgery  . HERNIA REPAIR Right   . INGUINAL HERNIA REPAIR Left 10/12/2013   Procedure: LEFT  INGUINAL HERNIA REPAIR;  Surgeon: Odis Hollingshead, MD;  Location: [facility];  Service: General;  Laterality: Left;  . INSERTION OF MESH Left 10/12/2013   Procedure: INSERTION OF MESH;  Surgeon: Odis Hollingshead, MD;  Location: [facility];  Service: General;  Laterality: Left;    There were no vitals filed for this visit.   Subjective Assessment - 10/22/19 1538    Subjective "I've been trying to swallow like y'all told me."    Currently in Pain? No/denies                 ADULT SLP TREATMENT - 10/22/19 1727      General Information   Behavior/Cognition Alert;Cooperative;Pleasant mood;Hard of hearing      Treatment Provided   Treatment provided Dysphagia      Dysphagia Treatment   Temperature Spikes Noted No    Respiratory Status Room air    Oral Cavity - Dentition Adequate natural dentition    Treatment Methods Skilled  observation;Compensation strategy training;Patient/caregiver education    Patient observed directly with PO's Yes    Type of PO's observed Dysphagia 3 (soft);Thin liquids    Liquids provided via Cup    Oral Phase Signs & Symptoms Other (comment)   none noted   Pharyngeal Phase Signs & Symptoms --   suspect residue   Type of cueing Verbal   written cues for procedure   Amount of cueing Minimal    Other treatment/comments Pt reports he has been trying to use chin tuck and multiple swallows, feels it is helping some. He is not completing any exercises. SLP used diagram/visual to explain pt's swallowing deficits. Pt asked "How can I make the muscles stronger?" and SLP educated that exercises provided at eval were to help this. SLP attempted to train pt in exercises; effortful swallow was the only one pt could complete. Instructed pt to complete this exercise only at home, and SLP to consider alternatives for hyolaryngeal ex next session (possibly chin tuck against resistance or [place]). With written cues provided for precautions, pt consumed 1/2 cookie and 4 oz water with min cues (for adequate chin tuck, reminder to follow with liquid x1).       Pain  Assessment   Pain Assessment No/denies pain      Assessment / Recommendations / Plan   Plan Continue with current plan of care      Progression Toward Goals   Progression toward goals Progressing toward goals            SLP Education - 10/22/19 1733    Education Details swallow precautions and rationale, rationale for exercises, should be done everyday    Person(s) Educated Patient    Methods Explanation;Demonstration;Handout    Comprehension Verbalized understanding            SLP Short Term Goals - 10/22/19 1735      SLP SHORT TERM GOAL #1   Title pt will perform Inspiratory Muscle Training (IMT) at 28 cm H2O with 100% accuracy over 30 trials    Time 3    Period Weeks    Status On-going      SLP SHORT TERM GOAL #2   Title pt will  perform Expiratory Muscle Training (EMT) at 45 cm H2O with 100% accuracy over 30 trials.    Time 3    Period Weeks    Status On-going      SLP SHORT TERM GOAL #3   Title Pt will follow swallow precations with occasional min A 10/10 boluses with occasional min A over 2 sessions    Time 3    Period Weeks    Status On-going            SLP Long Term Goals - 10/22/19 1735      SLP LONG TERM GOAL #1   Title Pt will complete HEP for dysphagia with occasional min A over 3 sessions    Baseline CTAR, hyolaryngeal elevation, BOT,    Time 7    Period Weeks    Status On-going      SLP LONG TERM GOAL #2   Title Pt and daughter will report carryover of swallow precautions with occasional min A over 2 sessions    Time 7    Period Weeks    Status On-going            Plan - 10/22/19 1734    Clinical Impression Statement Gregory [name] is referred for outpt ST for dysphagia therapy. CXR revealed chronic aspiration, MBSS completed at [facility]. Pt elects to attend ST in [place] where he lives. As pt did not recall any precautions, nor any specific details from last ST session. SLP reviewed precautions and provided rationale. Pt reports he is making effort to follow precautions at home- min cues (with written procedure provided). No overt s/s aspiration noted today. Noted that x2/week was recommended however pt is scheduled only x1/week.  I recommend cont'd skilled ST to maximize safety of swallow and reduce risk of aspiration pna, however if pt is not using precautions and not completing HEP at home, consideration may wnat to be made for d/c in next 1-2 sessions.    Speech Therapy Frequency 2x / week    Duration --   8 weeks or 17 visits   Treatment/Interventions Aspiration precaution training;Pharyngeal strengthening exercises;Diet toleration management by SLP;Trials of upgraded texture/liquids;Internal/external aids;Environmental controls;Cueing hierarchy;Oral motor exercises;SLP instruction and  feedback;Functional tasks;Compensatory strategies;Compensatory techniques;Other (comment);Patient/family education    Potential to Achieve Goals Fair    Potential Considerations Ability to learn/carryover information;Severity of impairments           Patient will benefit from skilled therapeutic intervention in order to improve the following deficits and impairments:  Dysphagia, pharyngeal phase    Problem List Patient Active Problem List   Diagnosis Date Noted  . Aortic stenosis, mild 10/15/2014  . Bradycardia 09/08/2013  . Pacemaker 09/08/2013  . Pre-operative cardiovascular examination 09/08/2013  . Inguinal hernia without mention of obstruction or gangrene, unilateral or unspecified, (not specified as recurrent)-left 08/11/2013  . Atrial flutter ([place]) 08/11/2013   Deneise Lever, [place], [place] Katheren Jimmerson 10/22/2019, 5:36 PM  [facility] [address] [place], [place], [address] Phone: [phone]   Fax:  [phone]   Name: Gregory [name] MRN: [id number] Date of Birth: [birth year]

## 2019-11-03 ENCOUNTER — Ambulatory Visit: Payer: Medicare Other | Admitting: Speech Pathology

## 2019-11-11 ENCOUNTER — Ambulatory Visit: Payer: Medicare Other | Admitting: Speech Pathology

## 2019-11-13 ENCOUNTER — Encounter: Payer: Medicare Other | Admitting: Student

## 2019-11-18 ENCOUNTER — Other Ambulatory Visit: Payer: Self-pay

## 2019-11-18 ENCOUNTER — Ambulatory Visit: Payer: Medicare Other | Attending: Internal Medicine | Admitting: Speech Pathology

## 2019-11-18 DIAGNOSIS — R1313 Dysphagia, pharyngeal phase: Secondary | ICD-10-CM | POA: Diagnosis not present

## 2019-11-18 NOTE — Therapy (Signed)
[facility] [address] [place], [place], [address] Phone: [phone]   Fax:  [phone]  Speech Language Pathology Treatment  Patient Details  Name: Athony Coppa MRN: [id number] Date of Birth: 04-14-1920 Referring Provider (SLP): Dr. Brynda Peon   Encounter Date: 11/18/2019   End of Session - 11/18/19 1547    Visit Number 4    Number of Visits 17    Date for SLP Re-Evaluation 11/30/19    SLP Start Time 0109    SLP Stop Time  1357    SLP Time Calculation (min) 40 min    Activity Tolerance Patient tolerated treatment well           Past Medical History:  Diagnosis Date  . Atrial flutter ([place])    s/p ablation  . Cancer ([place])    skin cancer  . Cataracts, bilateral   . Hypertension   . Neuromuscular disorder ([place])   . Pacemaker 2010  . Thyroid disease     Past Surgical History:  Procedure Laterality Date  . APPENDECTOMY    . COLONOSCOPY    . EYE SURGERY Bilateral    cataract surgery  . HERNIA REPAIR Right   . INGUINAL HERNIA REPAIR Left 10/12/2013   Procedure: LEFT  INGUINAL HERNIA REPAIR;  Surgeon: Odis Hollingshead, MD;  Location: [facility];  Service: General;  Laterality: Left;  . INSERTION OF MESH Left 10/12/2013   Procedure: INSERTION OF MESH;  Surgeon: Odis Hollingshead, MD;  Location: [facility];  Service: General;  Laterality: Left;    There were no vitals filed for this visit.   Subjective Assessment - 11/18/19 1407    Subjective "I'm still here"    Currently in Pain? No/denies                 ADULT SLP TREATMENT - 11/18/19 1409      General Information   Behavior/Cognition Alert;Cooperative;Pleasant mood;Hard of hearing      Treatment Provided   Treatment provided Dysphagia      Dysphagia Treatment   Temperature Spikes Noted No    Respiratory Status Room air    Oral Cavity - Dentition Adequate natural dentition    Treatment Methods Skilled observation;Compensation  strategy training;Patient/caregiver education    Patient observed directly with PO's Yes    Type of PO's observed Dysphagia 3 (soft);Thin liquids    Liquids provided via Cup    Oral Phase Signs & Symptoms Other (comment)   none noted   Pharyngeal Phase Signs & Symptoms Other (comment)   suspect residue   Type of cueing Verbal    Amount of cueing Minimal    Other treatment/comments Pt demonstrated swallow precautions with rare min A. He reports he is followng them at home. He reports he is completing effortful swllow at home as HEP. Added Chin tuck agains restistance (CTAR) today, and he progressed to occasional min A. Introduced EMT, set at 25. After initial instructions, pt completed 5 sets of 5 reps with mod I. (he did required cues to rest inbetween sets.      Pain Assessment   Pain Assessment No/denies pain      Assessment / Recommendations / Plan   Plan Continue with current plan of care      Dysphagia Recommendations   Diet recommendations Dysphagia 3 (mechanical soft);Thin liquid    Liquids provided via Cup    Medication Administration Crushed with puree    Supervision Intermittent supervision to cue for  compensatory strategies    Compensations Multiple dry swallows after each bite/sip;Follow solids with liquid    Postural Changes and/or Swallow Maneuvers Chin tuck      Progression Toward Goals   Progression toward goals Progressing toward goals            SLP Education - 11/18/19 1545    Education Details HEP for dysphagia; EMT    Person(s) Educated Patient    Methods Explanation;Demonstration;Handout    Comprehension Verbalized understanding;Returned demonstration;Verbal cues required;Need further instruction            SLP Short Term Goals - 11/18/19 1546      SLP SHORT TERM GOAL #1   Title pt will perform Inspiratory Muscle Training (IMT) at 28 cm H2O with 100% accuracy over 30 trials    Time 2    Period Weeks    Status On-going      SLP SHORT TERM GOAL #2    Title pt will perform Expiratory Muscle Training (EMT) at 45 cm H2O with 100% accuracy over 30 trials.    Time 2    Period Weeks    Status On-going      SLP SHORT TERM GOAL #3   Title Pt will follow swallow precations with occasional min A 10/10 boluses with occasional min A over 2 sessions    Time 2    Period Weeks    Status On-going            SLP Long Term Goals - 11/18/19 1546      SLP LONG TERM GOAL #1   Title Pt will complete HEP for dysphagia with occasional min A over 3 sessions    Baseline CTAR, hyolaryngeal elevation, BOT,    Time 6    Period Weeks    Status On-going      SLP LONG TERM GOAL #2   Title Pt and daughter will report carryover of swallow precautions with occasional min A over 2 sessions    Time 6    Period Weeks    Status On-going            Plan - 11/18/19 1545    Clinical Impression Statement Mr. Zvi Duplantis is referred for outpt ST for dysphagia therapy. CXR revealed chronic aspiration, MBSS completed at [facility]. Pt elects to attend ST in [place] where he lives. As pt did not recall any precautions, nor any specific details from last ST session. SLP reviewed precautions and provided rationale. Pt reports he is making effort to follow precautions at home- min cues (with written procedure provided). No overt s/s aspiration noted today. Noted that x2/week was recommended however pt is scheduled only x1/week.  I recommend cont'd skilled ST to maximize safety of swallow and reduce risk of aspiration pna, however if pt is not using precautions and not completing HEP at home, consideration may wnat to be made for d/c in next 1-2 sessions.    Speech Therapy Frequency 2x / week    Duration --   8 weeks or 17 visits   Treatment/Interventions Aspiration precaution training;Pharyngeal strengthening exercises;Diet toleration management by SLP;Trials of upgraded texture/liquids;Internal/external aids;Environmental controls;Cueing hierarchy;Oral motor  exercises;SLP instruction and feedback;Functional tasks;Compensatory strategies;Compensatory techniques;Other (comment);Patient/family education    Potential Considerations Ability to learn/carryover information;Severity of impairments           Patient will benefit from skilled therapeutic intervention in order to improve the following deficits and impairments:   Dysphagia, pharyngeal phase    Problem List Patient Active  Problem List   Diagnosis Date Noted  . Aortic stenosis, mild 10/15/2014  . Bradycardia 09/08/2013  . Pacemaker 09/08/2013  . Pre-operative cardiovascular examination 09/08/2013  . Inguinal hernia without mention of obstruction or gangrene, unilateral or unspecified, (not specified as recurrent)-left 08/11/2013  . Atrial flutter ([place]) 08/11/2013    Marjani Kobel, Annye Rusk MS, CCC-SLP 11/18/2019, 3:47 PM  [facility] [address] [place], [place], [address] Phone: [phone]   Fax:  [phone]   Name: Rigo Letts MRN: [id number] Date of Birth: August 10, 1920

## 2019-11-18 NOTE — Patient Instructions (Addendum)
   Keep doing your hard swallows 10x twice a day  Roll up hand towel, place under your neck and squeeze neck to hold the towel in place - count to 10 10x twice a day   You have been issued an Expiratory Respiratory Muscle Trainer to increase lung strength for improved speech and/or swallowing abilities. Please follow the instructions below provided by your Speech-Language Pathologist to complete your exercises.     Expiratory Muscle Training Blue Device-"Blue equals blow" . Perform 5  repetitions, 5x twice times per day. (so total of 25) rest in between each set of 5 . Device set at:  25cm H2O        Take a break or discontinue use of you experience lightheadedness, dizziness, pain, shortness of breath, color change, or excessive sweating. Any questions, call Glendell Docker or Mickel Baas at [phone]

## 2019-11-19 NOTE — Progress Notes (Signed)
[place] Date of Birth: [birth year] Medical Record #[id number]  History of Present Illness: Mr. Golaszewski is seen for cardiac follow up. He has a history of atrial flutter and pacemaker. Much of his cardiac care had been at the [facility] clinic in [place]. He reports that he had significant bradycardia in 2002 and had a pacemaker placed (Medtronic). This was revised in 2010.  He has a history of atrial flutter and had a successful ablation in the past.   When  seen by me in January 2018  he was found to be in atrial flutter and was tracking this at a high rate. He was started on Eliquis. Seen by Dr. [name] and pacemaker reprogrammed so he wouldn't track as fast. Attempted to pace terminate his Aflutter without success. When seen later in April 2018 he was found to be back in NSR. Last generator change at [facility] in May 2020.   In September 2020 he underwent TAVR with a #26 Sapien S3 Ultra valve in [place], [place]. Cardiac cath at that time showed obstructive disease in the distal RCA treated medically. Follow up Echo in October 2020 was satisfactory. He has remained in Afib. Pacemaker check in August with Dr [name] showed normal function.   He is now staying in [place] primarily. His wife passed away last year. He still takes care of multiple rental properties.   He denies any chest pain, dyspnea, or palpitations. He had follow up with [facility] clinic in [place] last month. CXR Ok. Ecg shows AFib with pacing. He has chronic anemia and CKD. Echo was stable.   Allergies as of 11/26/2019      Reactions   Penicillin G Rash   Penicillins Hives, Rash      Medication List       Accurate as of November 26, 2019  4:44 PM. If you have any questions, ask your nurse or doctor.        apixaban 2.5 MG Tabs tablet Commonly known as: Eliquis Take 1 tablet (2.5 mg total) by mouth 2 (two) times daily.   CALCIUM PO Take 1 tablet by mouth daily.   cholecalciferol 1000 units  tablet Commonly known as: VITAMIN D Take 3,000 Units by mouth daily.   Coenzyme Q10 200 MG Tabs Take 200 mg by mouth daily.   ferrous sulfate 325 (65 FE) MG EC tablet Take by mouth.   levothyroxine 75 MCG tablet Commonly known as: SYNTHROID Take 75 mcg by mouth daily before breakfast.   levothyroxine 75 MCG tablet Commonly known as: SYNTHROID Take by mouth.   MAGNESIUM PO Take 1-2 tablets by mouth daily.   POTASSIUM CHLORIDE PO Take 1 tablet by mouth daily.   vitamin B-12 1000 MCG tablet Commonly known as: CYANOCOBALAMIN Take 1,000 mcg by mouth daily.   vitamin C 500 MG tablet Commonly known as: ASCORBIC ACID Take 1,500 mg by mouth daily.        Allergies  Allergen Reactions  . Penicillin G Rash  . Penicillins Hives and Rash    Past Medical History:  Diagnosis Date  . Atrial flutter ([place])    s/p ablation  . Cancer ([place])    skin cancer  . Cataracts, bilateral   . Hypertension   . Neuromuscular disorder ([place])   . Pacemaker 2010  . Thyroid disease     Past Surgical History:  Procedure Laterality Date  . APPENDECTOMY    . COLONOSCOPY    . EYE SURGERY Bilateral  cataract surgery  . HERNIA REPAIR Right   . INGUINAL HERNIA REPAIR Left 10/12/2013   Procedure: LEFT  INGUINAL HERNIA REPAIR;  Surgeon: Odis Hollingshead, MD;  Location: [facility];  Service: General;  Laterality: Left;  . INSERTION OF MESH Left 10/12/2013   Procedure: INSERTION OF MESH;  Surgeon: Odis Hollingshead, MD;  Location: [facility];  Service: General;  Laterality: Left;    Social History   Socioeconomic History  . Marital status: Widowed    Spouse name: Not on file  . Number of children: 2  . Years of education: Not on file  . Highest education level: Not on file  Occupational History  . Occupation: retired Actor  Tobacco Use  . Smoking status: Never Smoker  . Smokeless tobacco: Never Used  Vaping Use  . Vaping Use: Never used  Substance and Sexual Activity  . Alcohol  use: No  . Drug use: No  . Sexual activity: Never  Other Topics Concern  . Not on file  Social History Narrative  . Not on file   Social Determinants of Health   Financial Resource Strain:   . Difficulty of Paying Living Expenses: Not on file  Food Insecurity:   . Worried About Charity fundraiser in the Last Year: Not on file  . Ran Out of Food in the Last Year: Not on file  Transportation Needs:   . Lack of Transportation (Medical): Not on file  . Lack of Transportation (Non-Medical): Not on file  Physical Activity:   . Days of Exercise per Week: Not on file  . Minutes of Exercise per Session: Not on file  Stress:   . Feeling of Stress : Not on file  Social Connections:   . Frequency of Communication with Friends and Family: Not on file  . Frequency of Social Gatherings with Friends and Family: Not on file  . Attends Religious Services: Not on file  . Active Member of Clubs or Organizations: Not on file  . Attends Archivist Meetings: Not on file  . Marital Status: Not on file    Family History  Problem Relation Age of Onset  . Heart disease Mother   . Cancer Father        leukemia    Review of Systems: As noted in HPI.  All other systems were reviewed and are negative.  Physical Exam: BP (!) 144/68 (BP Location: Left Arm, Patient Position: Sitting, Cuff Size: Normal)   Pulse 73   Ht 5\' 9"  (1.753 m)   Wt 145 lb (65.8 kg)   BMI 21.41 kg/m  Filed Weights   11/26/19 1624  Weight: 145 lb (65.8 kg)    GENERAL:  Well appearing, elderly WM in NAD HEENT:  PERRL, EOMI, sclera are clear. Oropharynx is clear. NECK:  No jugular venous distention, carotid upstroke brisk and symmetric, no bruits, no thyromegaly or adenopathy LUNGS:  Clear to auscultation bilaterally CHEST:  Unremarkable HEART:  RRR,  PMI not displaced or sustained,S1 and S2 within normal limits, no S3, no S4: no clicks, no rubs, he has soft 1/6 systolic murmur RUSB. ABD:  Soft, nontender. BS  +, no masses or bruits. No hepatomegaly, no splenomegaly EXT:  2 + pulses throughout, no edema, no cyanosis no clubbing SKIN:  Warm and dry.  No rashes NEURO:  Alert and oriented x 3. Cranial nerves II through XII intact. PSYCH:  Cognitively intact    LABORATORY DATA: Lab Results  Component Value Date  WBC 8.9 02/21/2016   HGB 10.4 (L) 02/21/2016   HCT 31.7 (L) 02/21/2016   PLT 229 02/21/2016   GLUCOSE 109 (H) 02/21/2016   ALT 13 02/21/2016   AST 20 02/21/2016   NA 135 02/21/2016   K 4.5 02/21/2016   CL 100 02/21/2016   CREATININE 1.26 (H) 02/21/2016   BUN 27 (H) 02/21/2016   CO2 27 02/21/2016   TSH 2.90 02/21/2016   INR 1.04 10/02/2013     Labs dated 04/19/15: cholesterol 153, triglycerides 50, LDL 87, HDL 56. Bun 29, creatinine 1.19. Other CMET normal. Hgb 10.6. Dated 05/23/16: creatinine 1.12. Other chemistries normal. Dated 05/24/17: cholesterol 168, triglycerides 65, HDL 65, LDL 90. Hgb 10.6. Creatinine 1.27. Other chemistries and TSH normal.  Dated 11/15/17: Hgb 10.2. CMET normal. Folate and B12 normal. TSH normal. Dated 09/05/18: cholesterol 178, triglycerides 39, HDL 76, LDL 94.  Dated 12/26/18: Hgb 9.4. creatinine 1.2.  Dated 11/06/19: Hgb 9.5, plts 121K creatinine 1.39. BUN 33.    Cardiac cath 09/24/18: PROCEDURE TYPES 1. CORONARY ANGIOGRAPHY 2. ULTRASOUND GUIDANCE FOR VASCULAR ACCESS FINAL DIAGNOSIS 1. Moderate coronary artery atherosclerosis PRE-PROCEDURE DIAGNOSIS 1. Stenosis Aortic Valve Acquired CORONARY DIAGNOSTIC SUMMARY Coronary artery dominance is right. Normal left main coronary.  The left main coronary artery is 10% obstructed by a discrete lesion.  The proximal left anterior descending artery is 30% obstructed by a discrete lesion. The distal segment is normal size, diseased.  The distal circumflex artery is 50% obstructed by multiple discrete lesions. The distal segment is normal size, diseased.  The middle right coronary artery is 50% obstructed  by multiple discrete lesions. The distal segment is normal size, diseased.  The distal right coronary artery is 90% obstructed by a discrete lesion. The distal segment is normal size, diseased.  The right posterior descending artery is 90% obstructed by a discrete lesion. The distal segment is large size.  RADIATION DOSE DATA Procedure cumulative skin dose (mGy): 261.00 Procedure cumulative dose area product (Gy-cm**2): 20.71 Fluoro Time (Min): 9.00 CONTRAST DOSE DATA IOHEXOL 350 MG IODINE/ML INTRAVENOUS SOLUTION: 163mL  Echo 12/03/18: Final Impressions 1. ECHOCARDIOGRAM 2. Goal directed echocardiogram 30 day s/p TAVR. 3. LEFT VENTRICLE: 4. Normal left ventricular chamber size. 5. Borderline increased concentric left ventricular wall thickness. 6. Calculated 2-D linear left ventricular ejection fraction 62 %. 7. CARDIAC VALVES: 8. Normal tissue aortic prosthesis. 9. Aortic valve systolic mean Doppler gradient 11 mmHg. 10. Aortic valve peak systolic velocity is, 2.2 m/sec. 11. Trivial aortic valve prosthetic regurgitation. 12. Moderate-severe tricuspid valve regurgitation (no change compared to echo on 08/20/2018). 13. Pacemaker catheter visualized. 14. No pericardial effusion. 15. Compared to the report of 10/29/2018 the following changes have occurred: The prosthetic aortic velocity has increased slightly.  Echo 11/06/19: Final Impressions  1. Normal left ventricular chamber size and wall thickness  2. Calculated 2-D biplane volumetric left ventricular ejection fraction 70 %.  3. Abnormal ventricular septal motion due to pacing.  4. Normal right ventricular chamber size and systolic function  5. Estimated right ventricular systolic pressure 57 mmHg (systolic blood pressure 267 mmHg).  6. Status post 26 mm Edwards Sapien transcatheter aortic valve bioprosthesis mean gradient 12  mmHg, EOA 1.61 cm2 with trivial periprosthetic regurgitation  7. Mild-moderate mitral valve  regurgitation.  8. No pericardial effusion.  9. Compared to the report of 12/03/2018 no significant change has occurred. Side by side  comparison of images performed.   Assessment / Plan: 1. History of bradycardia s/p permanent pacemaker. Good pacemaker  function. Follow up  In pacer clinic. Generator change out this past year 2. History of atrial flutter s/p ablation. Now with permanent Atrial fibrillation. He is asymptomatic. [place] Vasc score of 2 based on age. No history of bleeding or CVA. I have recommended continuing  Eliquis 2.5 mg  Bid. He is 89 and is borderline on renal function and weight.  3. History of aortic stenosis. S/p TAVR in September 2020. Follow up Echo in September 2021 was OK.  4. CAD with 90% distal RCA and PDA disease. Treated medically. Asymptomatic.  5. CKD stage 3a 6. Chronic anemia. Recent Hgb 9.5.   I will follow up in 6 months.

## 2019-11-25 ENCOUNTER — Ambulatory Visit: Payer: Medicare Other | Admitting: Speech Pathology

## 2019-11-26 ENCOUNTER — Encounter: Payer: Self-pay | Admitting: Cardiology

## 2019-11-26 ENCOUNTER — Other Ambulatory Visit: Payer: Self-pay

## 2019-11-26 ENCOUNTER — Ambulatory Visit (INDEPENDENT_AMBULATORY_CARE_PROVIDER_SITE_OTHER): Payer: Medicare Other | Admitting: Cardiology

## 2019-11-26 VITALS — BP 144/68 | HR 73 | Ht 69.0 in | Wt 145.0 lb

## 2019-11-26 DIAGNOSIS — I350 Nonrheumatic aortic (valve) stenosis: Secondary | ICD-10-CM

## 2019-11-26 DIAGNOSIS — I4811 Longstanding persistent atrial fibrillation: Secondary | ICD-10-CM | POA: Diagnosis not present

## 2019-11-26 DIAGNOSIS — Z950 Presence of cardiac pacemaker: Secondary | ICD-10-CM

## 2019-11-26 DIAGNOSIS — I442 Atrioventricular block, complete: Secondary | ICD-10-CM | POA: Diagnosis not present

## 2019-11-30 ENCOUNTER — Ambulatory Visit: Payer: Medicare Other | Admitting: Cardiology

## 2019-12-02 ENCOUNTER — Ambulatory Visit: Payer: Medicare Other | Admitting: Speech Pathology

## 2019-12-09 ENCOUNTER — Ambulatory Visit: Payer: Medicare Other | Admitting: Speech Pathology

## 2019-12-09 ENCOUNTER — Other Ambulatory Visit: Payer: Self-pay

## 2019-12-09 DIAGNOSIS — R1313 Dysphagia, pharyngeal phase: Secondary | ICD-10-CM

## 2019-12-09 NOTE — Therapy (Addendum)
[facility] [address] [place], [place], [address] Phone: [phone]   Fax:  [phone]  Speech Language Pathology Treatment & Discharge Summary  Patient Details  Name: Gregory [name] MRN: [id number] Date of Birth: 1920-06-11 Referring Provider (SLP): Dr. Brynda Peon   Encounter Date: 12/09/2019   End of Session - 12/09/19 1451    Visit Number 5    Number of Visits 17    Date for SLP Re-Evaluation 11/30/19    SLP Start Time 8099    SLP Stop Time  1438    SLP Time Calculation (min) 35 min           Past Medical History:  Diagnosis Date  . Atrial flutter ([place])    s/p ablation  . Cancer ([place])    skin cancer  . Cataracts, bilateral   . Hypertension   . Neuromuscular disorder ([place])   . Pacemaker 2010  . Thyroid disease     Past Surgical History:  Procedure Laterality Date  . APPENDECTOMY    . COLONOSCOPY    . EYE SURGERY Bilateral    cataract surgery  . HERNIA REPAIR Right   . INGUINAL HERNIA REPAIR Left 10/12/2013   Procedure: LEFT  INGUINAL HERNIA REPAIR;  Surgeon: Odis Hollingshead, MD;  Location: [facility];  Service: General;  Laterality: Left;  . INSERTION OF MESH Left 10/12/2013   Procedure: INSERTION OF MESH;  Surgeon: Odis Hollingshead, MD;  Location: [facility];  Service: General;  Laterality: Left;    There were no vitals filed for this visit.   Subjective Assessment - 12/09/19 1416    Subjective ":I'm still here"    Currently in Pain? No/denies                 ADULT SLP TREATMENT - 12/09/19 1416      General Information   Behavior/Cognition Alert;Cooperative;Pleasant mood;Hard of hearing      Treatment Provided   Treatment provided Dysphagia      Dysphagia Treatment   Temperature Spikes Noted No    Respiratory Status Room air    Oral Cavity - Dentition Adequate natural dentition    Treatment Methods Skilled observation;Compensation strategy training;Patient/caregiver  education    Patient observed directly with PO's Yes    Type of PO's observed Dysphagia 3 (soft);Thin liquids    Feeding Able to feed self    Liquids provided via Cup    Oral Phase Signs & Symptoms Other (comment)   none noted   Pharyngeal Phase Signs & Symptoms Immediate cough    Type of cueing Verbal    Amount of cueing Minimal    Other treatment/comments Gregory [name] demonstrated swallow precautions with rare min A - 1 verbal reminder to swallow solid prior to taking liquid sip. After 1 reminder, he followed this with mod I. Pt demonstrated HEP with mod I. 2 episodes of coughing after liquid sips. Pt did not bring EMT, he has not been using it and "forgot where he put it" However, he reports he has been completing effortful swallow and CTAR at home.      Pain Assessment   Pain Assessment No/denies pain      Assessment / Recommendations / Plan   Plan Discharge SLP treatment due to (comment)   pt not wanting to attend ST, has cancelled 5 appointments,      Progression Toward Goals   Progression toward goals Goals met, education completed, patient discharged from SLP  SLP Education - 12/09/19 1446    Education Details swallow precautions and HEP    Person(s) Educated Patient    Methods Explanation;Demonstration;Handout    Comprehension Verbalized understanding;Returned demonstration          SPEECH THERAPY DISCHARGE SUMMARY  Visits from Start of Care: 5  Current functional level related to goals / functional outcomes: See goals below   Remaining deficits: Oropharyngeal dysphagia   Education / Equipment: Swallow precautions, diet modifications, EMT Plan: Patient agrees to discharge.  Patient goals were not met. Patient is being discharged due to meeting the stated rehab goals.  ?????         SLP Short Term Goals - 12/09/19 1450      SLP SHORT TERM GOAL #1   Title pt will perform Inspiratory Muscle Training (IMT) at 28 cm H2O with 100% accuracy over 30 trials     Time 2    Period Weeks    Status Not Met      SLP SHORT TERM GOAL #2   Title pt will perform Expiratory Muscle Training (EMT) at 45 cm H2O with 100% accuracy over 30 trials.    Time 2    Period Weeks    Status Not Met      SLP SHORT TERM GOAL #3   Title Pt will follow swallow precations with occasional min A 10/10 boluses with occasional min A over 2 sessions    Time 2    Period Weeks    Status Achieved            SLP Long Term Goals - 12/09/19 1451      SLP LONG TERM GOAL #1   Title Pt will complete HEP for dysphagia with occasional min A over 3 sessions    Baseline CTAR, hyolaryngeal elevation, BOT,    Time 5    Period Weeks    Status Achieved      SLP LONG TERM GOAL #2   Title Pt and daughter will report carryover of swallow precautions with occasional min A over 2 sessions    Time 6    Period Weeks    Status Not Met            Plan - 12/09/19 1446    Clinical Impression Statement Gregory [name] follows swallow precautions with rare min to mod I in ST sessions. He states he is carrying over precautions at home and restaurants, however his daughter disagrees. Gregory [name] has not been completing EMT and did not bring in EMT trainer today. He completed HEP (except EMT) with rare min A. As Gregory [name] has cancelled 5 of his past 10 appointments. As he is mod I with HEP and swallow precautions, recommed d/c from [facility]. Daughter aware    Speech Therapy Frequency 2x / week    Duration --   8 weeks or 17 visits   Treatment/Interventions Aspiration precaution training;Pharyngeal strengthening exercises;Diet toleration management by SLP;Trials of upgraded texture/liquids;Internal/external aids;Environmental controls;Cueing hierarchy;Oral motor exercises;SLP instruction and feedback;Functional tasks;Compensatory strategies;Compensatory techniques;Other (comment);Patient/family education    Potential to Achieve Goals Fair    Potential Considerations Cooperation/participation level;Severity of  impairments           Patient will benefit from skilled therapeutic intervention in order to improve the following deficits and impairments:   Dysphagia, pharyngeal phase    Problem List Patient Active Problem List   Diagnosis Date Noted  . Aortic stenosis, mild 10/15/2014  . Bradycardia 09/08/2013  . Pacemaker 09/08/2013  . Pre-operative cardiovascular  examination 09/08/2013  . Inguinal hernia without mention of obstruction or gangrene, unilateral or unspecified, (not specified as recurrent)-left 08/11/2013  . Atrial flutter ([place]) 08/11/2013    Ileane Sando, Annye Rusk MS, CCC-SLP 12/09/2019, 2:52 PM  [facility] [address] [place], [place], [address] Phone: [phone]   Fax:  [phone]   Name: Gregory [name] MRN: [id number] Date of Birth: 1920/06/29

## 2019-12-09 NOTE — Patient Instructions (Signed)
   Chin down  Swallow solid  Do 3 swallows  Chin down  Liquid sip  3 swallows  Clear your throat  Chin down with pills as well  Keep doing 10 hard swallows twice a day  Keep doing the chin tuck against resistance 10x twice a day (squeezing your neck to hold the towel count to 10 each time)  When you eat out you should still use the chin tuck, extra swallows and alternate solids and liquids. This can be harder when you are distracted in a restaurant

## 2020-01-05 ENCOUNTER — Ambulatory Visit (INDEPENDENT_AMBULATORY_CARE_PROVIDER_SITE_OTHER): Payer: Medicare Other

## 2020-01-05 DIAGNOSIS — I4892 Unspecified atrial flutter: Secondary | ICD-10-CM

## 2020-01-05 LAB — CUP PACEART REMOTE DEVICE CHECK
Battery Remaining Longevity: 139 mo
Battery Voltage: 3.04 V
Brady Statistic RV Percent Paced: 99.94 %
Date Time Interrogation Session: 20211123010352
Implantable Lead Implant Date: 20021114
Implantable Lead Location: 753860
Implantable Lead Model: 5076
Implantable Pulse Generator Implant Date: 20200506
Lead Channel Impedance Value: 342 Ohm
Lead Channel Impedance Value: 418 Ohm
Lead Channel Pacing Threshold Amplitude: 1 V
Lead Channel Pacing Threshold Pulse Width: 0.4 ms
Lead Channel Sensing Intrinsic Amplitude: 7.375 mV
Lead Channel Sensing Intrinsic Amplitude: 7.375 mV
Lead Channel Setting Pacing Amplitude: 2 V
Lead Channel Setting Pacing Pulse Width: 0.4 ms
Lead Channel Setting Sensing Sensitivity: 0.9 mV

## 2020-01-11 NOTE — Progress Notes (Signed)
Remote pacemaker transmission.   

## 2020-01-25 ENCOUNTER — Telehealth: Payer: Self-pay | Admitting: Cardiology

## 2020-01-25 NOTE — Telephone Encounter (Signed)
Spoke to patient's daughter [name] advised pacemaker normal function.No events recorded.Advised to keep appointment with Kerin Ransom PA 12/20 at 3:15 pm.Advised if he has any more dizzy spells he needs to go to ED.

## 2020-01-25 NOTE — Telephone Encounter (Signed)
° ° °  STAT if patient feels like he/she is going to faint   1) Are you dizzy now? No  2) Do you feel faint or have you passed out? No  3) Do you have any other symptoms? 3 weeks ago  4) Have you checked your HR and BP (record if available)? No   Pt's daughter calling, she said pt been having dizzy spells it started 3 weeks ago but pt didn't tell her until Saturday evening, he almost fell because he felt dizzy. She wanted for pt to be seen today or sooner by Dr. Martinique.

## 2020-01-25 NOTE — Telephone Encounter (Signed)
Agree with recs  Lashawnna Lambrecht Martinique MD, [facility]

## 2020-01-25 NOTE — Telephone Encounter (Signed)
Patient's daughter returning call. 

## 2020-01-25 NOTE — Telephone Encounter (Signed)
Manual transmission received and reviewed. Normal device function. No events recorded. Presenting VP 73 bpm. Routing back to Dr. Martinique to update.

## 2020-01-25 NOTE — Telephone Encounter (Signed)
The pt daughter agreed to send a manual transmission.

## 2020-01-25 NOTE — Telephone Encounter (Signed)
LMOVM requesting transmission and to call the device clinic.

## 2020-01-25 NOTE — Telephone Encounter (Signed)
Spoke to patient's daughter [name].Stated her father has had 2 dizzy spells in the past 2 weeks.Stated he felt like he was going to pass out.No chest pain.No sob.Stated he had a episode last night.Appointment scheduled with Kerin Ransom PA 02/01/20 at 3:15 pm.Advised I will send message to device clinic.Stated father will be leaving home at 11:30 this morning and he will be back after 4:30 pm.Advised to go to ED if needed.

## 2020-02-01 ENCOUNTER — Other Ambulatory Visit: Payer: Self-pay

## 2020-02-01 ENCOUNTER — Ambulatory Visit (INDEPENDENT_AMBULATORY_CARE_PROVIDER_SITE_OTHER): Payer: Medicare Other | Admitting: Cardiology

## 2020-02-01 ENCOUNTER — Encounter: Payer: Self-pay | Admitting: Cardiology

## 2020-02-01 VITALS — BP 100/60 | HR 71 | Ht 69.0 in | Wt 151.0 lb

## 2020-02-01 DIAGNOSIS — Z952 Presence of prosthetic heart valve: Secondary | ICD-10-CM

## 2020-02-01 DIAGNOSIS — Z79899 Other long term (current) drug therapy: Secondary | ICD-10-CM | POA: Diagnosis not present

## 2020-02-01 DIAGNOSIS — Z950 Presence of cardiac pacemaker: Secondary | ICD-10-CM

## 2020-02-01 DIAGNOSIS — Z7901 Long term (current) use of anticoagulants: Secondary | ICD-10-CM | POA: Diagnosis not present

## 2020-02-01 DIAGNOSIS — I482 Chronic atrial fibrillation, unspecified: Secondary | ICD-10-CM | POA: Diagnosis not present

## 2020-02-01 DIAGNOSIS — N183 Chronic kidney disease, stage 3 unspecified: Secondary | ICD-10-CM | POA: Insufficient documentation

## 2020-02-01 DIAGNOSIS — I2510 Atherosclerotic heart disease of native coronary artery without angina pectoris: Secondary | ICD-10-CM | POA: Diagnosis not present

## 2020-02-01 DIAGNOSIS — R42 Dizziness and giddiness: Secondary | ICD-10-CM | POA: Insufficient documentation

## 2020-02-01 LAB — CBC
Hematocrit: 26.2 % — ABNORMAL LOW (ref 37.5–51.0)
Hemoglobin: 8.9 g/dL — ABNORMAL LOW (ref 13.0–17.7)
MCH: 32.6 pg (ref 26.6–33.0)
MCHC: 34 g/dL (ref 31.5–35.7)
MCV: 96 fL (ref 79–97)
Platelets: 131 10*3/uL — ABNORMAL LOW (ref 150–450)
RBC: 2.73 x10E6/uL — CL (ref 4.14–5.80)
RDW: 12.8 % (ref 11.6–15.4)
WBC: 8.6 10*3/uL (ref 3.4–10.8)

## 2020-02-01 LAB — BASIC METABOLIC PANEL
BUN/Creatinine Ratio: 23 (ref 10–24)
BUN: 27 mg/dL (ref 10–36)
CO2: 25 mmol/L (ref 20–29)
Calcium: 9.2 mg/dL (ref 8.6–10.2)
Chloride: 103 mmol/L (ref 96–106)
Creatinine, Ser: 1.15 mg/dL (ref 0.76–1.27)
GFR calc Af Amer: 61 mL/min/{1.73_m2} (ref >59)
GFR calc non Af Amer: 53 mL/min/{1.73_m2} — ABNORMAL LOW (ref >59)
Glucose: 105 mg/dL — ABNORMAL HIGH (ref 65–99)
Potassium: 4.9 mmol/L (ref 3.5–5.2)
Sodium: 140 mmol/L (ref 134–144)

## 2020-02-01 NOTE — Assessment & Plan Note (Signed)
SSS with MDT PTVDP- Dr [name] follows

## 2020-02-01 NOTE — Assessment & Plan Note (Addendum)
This was reported by the patient's family in a phone call but the patient declined to have his family present during this visit and did not complain to me of dizziness.  B/P by me sitting was 130/60. HR 72 and regular

## 2020-02-01 NOTE — Assessment & Plan Note (Signed)
GFR 46 in Oct 2021

## 2020-02-01 NOTE — Assessment & Plan Note (Signed)
Eliquis 2.5 mg BID- CHADS VASC=2 The patient did report to me an episode of diarrhea yesterday "that was real dark". He is on oral iron.  He just had the one episode, no recurrence.

## 2020-02-01 NOTE — Progress Notes (Signed)
Cardiology Office Note:    Date:  02/01/2020   ID:  Gregory [name], DOB [birth year], MRN [id number]  PCP:  Brynda Peon, MD  Cardiologist:  Peter Martinique, MD  Electrophysiologist:  None   Referring MD: Brynda Peon, MD   Chief Complaint  Patient presents with  . Follow-up    DIZZY, LOST OF APPETITE     History of Present Illness:    Gregory [name] is a 86 y.o. male with a hx of chronic atrial fibrillation, Medtronic pacemaker, chronic anticoagulation with Eliquis 2.5 twice daily, aortic stenosis with a history of TAVR in September 2020, and distal CAD in the RCA to be treated medically.  Patient's last echocardiogram was at [facility] Sept 2021 showed an EF of 70% and normal prosthetic valve function.  The patient lives in his own home, his son lives with him.  His wife passed about a year ago.  The patient is seen in the office today, the family called and reported some dizziness.  The patient came to the office with family members but declined to have them present during the exam or interview.  To me he did not say he was having dizziness.  He did tell me of an episode where he was incontinent of stool and he reported that it was dark.  He is on iron therapy.  He only had the one episode a day or so ago and has not had recurrence.  He is not had syncope or tachycardia.  His blood pressure in the office by me was 130/60.  His heart rate was 70- paced.   Past Medical History:  Diagnosis Date  . Atrial flutter ([place])    s/p ablation  . Cancer ([place])    skin cancer  . Cataracts, bilateral   . Hypertension   . Neuromuscular disorder ([place])   . Pacemaker 2010  . Thyroid disease     Past Surgical History:  Procedure Laterality Date  . APPENDECTOMY    . COLONOSCOPY    . EYE SURGERY Bilateral    cataract surgery  . HERNIA REPAIR Right   . INGUINAL HERNIA REPAIR Left 10/12/2013   Procedure: LEFT  INGUINAL HERNIA REPAIR;  Surgeon: Odis Hollingshead, MD;  Location: [facility];   Service: General;  Laterality: Left;  . INSERTION OF MESH Left 10/12/2013   Procedure: INSERTION OF MESH;  Surgeon: Odis Hollingshead, MD;  Location: [place];  Service: General;  Laterality: Left;    Current Medications: Current Meds  Medication Sig  . apixaban (ELIQUIS) 2.5 MG TABS tablet Take 1 tablet (2.5 mg total) by mouth 2 (two) times daily.  Marland Kitchen CALCIUM PO Take 1 tablet by mouth daily.   . cholecalciferol (VITAMIN D) 1000 UNITS tablet Take 3,000 Units by mouth daily.   . Coenzyme Q10 200 MG TABS Take 200 mg by mouth daily.   Marland Kitchen levothyroxine (SYNTHROID) 75 MCG tablet Take by mouth.  Marland Kitchen MAGNESIUM PO Take 1-2 tablets by mouth daily.   . mirtazapine (REMERON) 7.5 MG tablet Take 7.5 mg by mouth daily as needed.  Marland Kitchen POTASSIUM CHLORIDE PO Take 1 tablet by mouth daily.  . vitamin B-12 (CYANOCOBALAMIN) 1000 MCG tablet Take 1,000 mcg by mouth daily.   . vitamin C (ASCORBIC ACID) 500 MG tablet Take 1,500 mg by mouth daily.      Allergies:   Penicillin g and Penicillins   Social History   Socioeconomic History  . Marital status: Widowed    Spouse name:  Not on file  . Number of children: 2  . Years of education: Not on file  . Highest education level: Not on file  Occupational History  . Occupation: retired Actor  Tobacco Use  . Smoking status: Never Smoker  . Smokeless tobacco: Never Used  Vaping Use  . Vaping Use: Never used  Substance and Sexual Activity  . Alcohol use: No  . Drug use: No  . Sexual activity: Never  Other Topics Concern  . Not on file  Social History Narrative  . Not on file   Social Determinants of Health   Financial Resource Strain: Not on file  Food Insecurity: Not on file  Transportation Needs: Not on file  Physical Activity: Not on file  Stress: Not on file  Social Connections: Not on file     Family History: The patient's family history includes Cancer in his father; Heart disease in his mother.  ROS:   Please see the history of present  illness.     All other systems reviewed and are negative.  EKGs/Labs/Other Studies Reviewed:    The following studies were reviewed today: Echo 11/03/2019- Filomena Jungling, M.D. - 11/06/2019  Formatting of this note might be different from the original.   For the complete report, see the Order-Level Documents.   Final Impressions  1. Normal left ventricular chamber size and wall thickness  2. Calculated 2-D biplane volumetric left ventricular ejection fraction 70 %.  3. Abnormal ventricular septal motion due to pacing.  4. Normal right ventricular chamber size and systolic function  5. Estimated right ventricular systolic pressure 57 mmHg (systolic blood pressure 701 mmHg).  6. Status post 26 mm Edwards Sapien transcatheter aortic valve bioprosthesis mean gradient 12  mmHg, EOA 1.61 cm2 with trivial periprosthetic regurgitation  7. Mild-moderate mitral valve regurgitation.  8. No pericardial effusion.  9. Compared to the report of 12/03/2018 no significant change has occurred. Side by side  comparison of images performed.    EKG:  EKG is ordered today.  The ekg ordered today demonstrates paced rythm  Recent Labs: No results found for requested labs within last 8760 hours.  Recent Lipid Panel No results found for: CHOL, TRIG, HDL, CHOLHDL, VLDL, LDLCALC, LDLDIRECT  Physical Exam:    VS:  BP 100/60 (BP Location: Left Arm, Patient Position: Sitting, Cuff Size: Normal)   Pulse 71   Ht 5\' 9"  (1.753 m)   Wt 151 lb (68.5 kg)   BMI 22.30 kg/m     Wt Readings from Last 3 Encounters:  02/01/20 151 lb (68.5 kg)  11/26/19 145 lb (65.8 kg)  03/19/19 146 lb 8 oz (66.5 kg)     GEN: Elderly, thin, frail, pale Caucasian male, in no acute distress HEENT: Normal NECK: No JVD; No carotid bruits CARDIAC: RRR, no murmurs, rubs, gallops RESPIRATORY:  Clear to auscultation without rales, wheezing or rhonchi  ABDOMEN: Soft, non-distended MUSCULOSKELETAL:  No edema; No deformity  SKIN: Warm  and dry NEUROLOGIC:  Alert and oriented x 3 PSYCHIATRIC:  Normal affect   ASSESSMENT:    Dizziness This was reported by the patient's family in a phone call but the patient declined to have his family present during this visit and did not complain to me of dizziness.  B/P by me sitting was 130/60. HR 72 and regular  Chronic atrial fibrillation (HCC) SSS with MDT PTVDP- Dr [name] follows  Chronic anticoagulation Eliquis 2.5 mg BID- CHADS VASC=2 The patient did report to me an  episode of diarrhea yesterday "that was real dark". He is on oral iron.  He just had the one episode, no recurrence.   CRI (chronic renal insufficiency), stage 3 (moderate) (HCC) GFR 46 in Oct 2021  CAD (coronary artery disease) Distal RCA disease- medical Rx  PLAN:    My concern is he may be anemic or dehydrated.  His B/P was stable in the office today.  He did have labs done in Oct 2021 (see care Everywhere).  I ordered STAT labs today- if he has significant anemia he may have to come off Eliquis.    Medication Adjustments/Labs and Tests Ordered: Current medicines are reviewed at length with the patient today.  Concerns regarding medicines are outlined above.  Orders Placed This Encounter  Procedures  . CBC  . Basic Metabolic Panel (BMET)  . EKG 12-Lead   No orders of the defined types were placed in this encounter.   Patient Instructions  Medication Instructions:  Continue current medications  *If you need a refill on your cardiac medications before your next appointment, please call your pharmacy*   Lab Work: CBC and BMP  If you have labs (blood work) drawn today and your tests are completely normal, you will receive your results only by: Marland Kitchen MyChart Message (if you have MyChart) OR . A paper copy in the mail If you have any lab test that is abnormal or we need to change your treatment, we will call you to review the results.   Testing/Procedures: None Ordered   Follow-Up: At [facility], you and your health needs are our priority.  As part of our continuing mission to provide you with exceptional heart care, we have created designated Provider Care Teams.  These Care Teams include your primary Cardiologist (physician) and Advanced Practice Providers (APPs -  Physician Assistants and Nurse Practitioners) who all work together to provide you with the care you need, when you need it.  We recommend signing up for the patient portal called "MyChart".  Sign up information is provided on this After Visit Summary.  MyChart is used to connect with patients for Virtual Visits (Telemedicine).  Patients are able to view lab/test results, encounter notes, upcoming appointments, etc.  Non-urgent messages can be sent to your provider as well.   To learn more about what you can do with MyChart, go to [email or web address].    Your next appointment:   Next available with Dr Martinique  The format for your next appointment:   In Person  Provider:   Peter Martinique, MD      Signed, Kerin Ransom, PA-C  02/01/2020 3:57 PM    [place]

## 2020-02-01 NOTE — Patient Instructions (Signed)
Medication Instructions:  Continue current medications  *If you need a refill on your cardiac medications before your next appointment, please call your pharmacy*   Lab Work: CBC and BMP  If you have labs (blood work) drawn today and your tests are completely normal, you will receive your results only by:  [place] (if you have MyChart) OR  A paper copy in the mail If you have any lab test that is abnormal or we need to change your treatment, we will call you to review the results.   Testing/Procedures: None Ordered   Follow-Up: At [facility], you and your health needs are our priority.  As part of our continuing mission to provide you with exceptional heart care, we have created designated Provider Care Teams.  These Care Teams include your primary Cardiologist (physician) and Advanced Practice Providers (APPs -  Physician Assistants and Nurse Practitioners) who all work together to provide you with the care you need, when you need it.  We recommend signing up for the patient portal called "MyChart".  Sign up information is provided on this After Visit Summary.  MyChart is used to connect with patients for Virtual Visits (Telemedicine).  Patients are able to view lab/test results, encounter notes, upcoming appointments, etc.  Non-urgent messages can be sent to your provider as well.   To learn more about what you can do with MyChart, go to [email or web address].    Your next appointment:   Next available with Dr Martinique  The format for your next appointment:   In Person  Provider:   Peter Martinique, MD

## 2020-02-01 NOTE — Assessment & Plan Note (Signed)
Distal RCA disease- medical Rx

## 2020-02-02 ENCOUNTER — Other Ambulatory Visit: Payer: Self-pay

## 2020-02-02 MED ORDER — PANTOPRAZOLE SODIUM 40 MG PO TBEC
40.0000 mg | DELAYED_RELEASE_TABLET | Freq: Every day | ORAL | 1 refills | Status: DC
Start: 2020-02-02 — End: 2020-02-02

## 2020-02-02 MED ORDER — PANTOPRAZOLE SODIUM 40 MG PO TBEC
40.0000 mg | DELAYED_RELEASE_TABLET | Freq: Every day | ORAL | 1 refills | Status: DC
Start: 2020-02-02 — End: 2020-04-05

## 2020-02-22 NOTE — Progress Notes (Signed)
[facility] Date of Birth: 1920-10-23 Medical Record #[id number]  History of Present Illness: Mr. Butrick is seen for cardiac follow up. He has a history of atrial flutter and pacemaker. Much of his cardiac care had been at the [facility] clinic in [place]. He reports that he had significant bradycardia in 2002 and had a pacemaker placed (Medtronic). This was revised in 2010.  He has a history of atrial flutter and had a successful ablation in the past.   When  seen by me in January 2018  he was found to be in atrial flutter and was tracking this at a high rate. He was started on Eliquis. Seen by Dr. [name] and pacemaker reprogrammed so he wouldn't track as fast. Attempted to pace terminate his Aflutter without success. When seen later in April 2018 he was found to be back in NSR. Last generator change at [facility] in May 2020.   In September 2020 he underwent TAVR with a #26 Sapien S3 Ultra valve in [place], [place]. Cardiac cath at that time showed obstructive disease in the distal RCA treated medically. Follow up Echo in October 2020 was satisfactory. He has remained in Afib. Pacemaker check in August with Dr [name] showed normal function.   He is now staying in [place] primarily. His wife passed away last year. He still takes care of multiple rental properties.   He denies any chest pain, dyspnea, or palpitations. He had follow up with [facility] clinic in [place] in the fall. CXR Ok. Ecg showed AFib with pacing. He has chronic anemia and CKD. Echo was stable.   Pacemaker check in November was normal. On follow up today he feels very well. Denies any chest pain, dyspnea, palpitations. No edema. He stays very active.    Allergies as of 02/26/2020      Reactions   Amlodipine Besylate    Other reaction(s): edema   Penicillin G Rash   Penicillins Hives, Rash      Medication List       Accurate as of February 26, 2020  3:18 PM. If you have any questions, ask your nurse  or doctor.        apixaban 2.5 MG Tabs tablet Commonly known as: Eliquis Take 1 tablet (2.5 mg total) by mouth 2 (two) times daily.   CALCIUM PO Take 1 tablet by mouth daily.   cholecalciferol 1000 units tablet Commonly known as: VITAMIN D Take 3,000 Units by mouth daily.   Coenzyme Q10 200 MG Tabs Take 200 mg by mouth daily.   ferrous sulfate 325 (65 FE) MG EC tablet Take by mouth.   folic acid 1 MG tablet Commonly known as: FOLVITE Take by mouth.   levothyroxine 75 MCG tablet Commonly known as: SYNTHROID Take by mouth.   MAGNESIUM PO Take 1-2 tablets by mouth daily.   mirtazapine 7.5 MG tablet Commonly known as: REMERON Take 7.5 mg by mouth daily as needed.   pantoprazole 40 MG tablet Commonly known as: PROTONIX Take 1 tablet (40 mg total) by mouth daily. Future rx's with PCP   POTASSIUM CHLORIDE PO Take 1 tablet by mouth daily.   vitamin B-12 1000 MCG tablet Commonly known as: CYANOCOBALAMIN Take 1,000 mcg by mouth daily.   vitamin C 500 MG tablet Commonly known as: ASCORBIC ACID Take 1,500 mg by mouth daily.        Allergies  Allergen Reactions  . Amlodipine Besylate     Other reaction(s): edema  . Penicillin  G Rash  . Penicillins Hives and Rash    Past Medical History:  Diagnosis Date  . Atrial flutter ([place])    s/p ablation  . Cancer ([place])    skin cancer  . Cataracts, bilateral   . Hypertension   . Neuromuscular disorder ([place])   . Pacemaker 2010  . Thyroid disease     Past Surgical History:  Procedure Laterality Date  . APPENDECTOMY    . COLONOSCOPY    . EYE SURGERY Bilateral    cataract surgery  . HERNIA REPAIR Right   . INGUINAL HERNIA REPAIR Left 10/12/2013   Procedure: LEFT  INGUINAL HERNIA REPAIR;  Surgeon: Odis Hollingshead, MD;  Location: [facility];  Service: General;  Laterality: Left;  . INSERTION OF MESH Left 10/12/2013   Procedure: INSERTION OF MESH;  Surgeon: Odis Hollingshead, MD;  Location: [facility];  Service: General;   Laterality: Left;    Social History   Socioeconomic History  . Marital status: Widowed    Spouse name: Not on file  . Number of children: 2  . Years of education: Not on file  . Highest education level: Not on file  Occupational History  . Occupation: retired Actor  Tobacco Use  . Smoking status: Never Smoker  . Smokeless tobacco: Never Used  Vaping Use  . Vaping Use: Never used  Substance and Sexual Activity  . Alcohol use: No  . Drug use: No  . Sexual activity: Never  Other Topics Concern  . Not on file  Social History Narrative  . Not on file   Social Determinants of Health   Financial Resource Strain: Not on file  Food Insecurity: Not on file  Transportation Needs: Not on file  Physical Activity: Not on file  Stress: Not on file  Social Connections: Not on file    Family History  Problem Relation Age of Onset  . Heart disease Mother   . Cancer Father        leukemia    Review of Systems: As noted in HPI.  All other systems were reviewed and are negative.  Physical Exam: BP 130/70   Pulse 71   Ht 5\' 9"  (1.753 m)   Wt 156 lb (70.8 kg)   SpO2 98%   BMI 23.04 kg/m  Filed Weights   02/26/20 1451  Weight: 156 lb (70.8 kg)    GENERAL:  Well appearing, elderly WM in NAD HEENT:  PERRL, EOMI, sclera are clear. Oropharynx is clear. NECK:  No jugular venous distention, carotid upstroke brisk and symmetric, no bruits, no thyromegaly or adenopathy LUNGS:  Clear to auscultation bilaterally CHEST:  Unremarkable HEART:  RRR,  PMI not displaced or sustained,S1 and S2 within normal limits, no S3, no S4: no clicks, no rubs, he has soft 1/6 systolic murmur RUSB. ABD:  Soft, nontender. BS +, no masses or bruits. No hepatomegaly, no splenomegaly EXT:  2 + pulses throughout, no edema, no cyanosis no clubbing SKIN:  Warm and dry.  No rashes NEURO:  Alert and oriented x 3. Cranial nerves II through XII intact. PSYCH:  Cognitively intact    LABORATORY  DATA: Lab Results  Component Value Date   WBC 8.6 02/01/2020   HGB 8.9 (L) 02/01/2020   HCT 26.2 (L) 02/01/2020   PLT 131 (L) 02/01/2020   GLUCOSE 105 (H) 02/01/2020   ALT 13 02/21/2016   AST 20 02/21/2016   NA 140 02/01/2020   K 4.9 02/01/2020   CL 103  02/01/2020   CREATININE 1.15 02/01/2020   BUN 27 02/01/2020   CO2 25 02/01/2020   TSH 2.90 02/21/2016   INR 1.04 10/02/2013     Labs dated 04/19/15: cholesterol 153, triglycerides 50, LDL 87, HDL 56. Bun 29, creatinine 1.19. Other CMET normal. Hgb 10.6. Dated 05/23/16: creatinine 1.12. Other chemistries normal. Dated 05/24/17: cholesterol 168, triglycerides 65, HDL 65, LDL 90. Hgb 10.6. Creatinine 1.27. Other chemistries and TSH normal.  Dated 11/15/17: Hgb 10.2. CMET normal. Folate and B12 normal. TSH normal. Dated 09/05/18: cholesterol 178, triglycerides 39, HDL 76, LDL 94.  Dated 12/26/18: Hgb 9.4. creatinine 1.2.  Dated 11/06/19: Hgb 9.5, plts 121K creatinine 1.39. BUN 33.    Cardiac cath 09/24/18: PROCEDURE TYPES 1. CORONARY ANGIOGRAPHY 2. ULTRASOUND GUIDANCE FOR VASCULAR ACCESS FINAL DIAGNOSIS 1. Moderate coronary artery atherosclerosis PRE-PROCEDURE DIAGNOSIS 1. Stenosis Aortic Valve Acquired CORONARY DIAGNOSTIC SUMMARY Coronary artery dominance is right. Normal left main coronary.  The left main coronary artery is 10% obstructed by a discrete lesion.  The proximal left anterior descending artery is 30% obstructed by a discrete lesion. The distal segment is normal size, diseased.  The distal circumflex artery is 50% obstructed by multiple discrete lesions. The distal segment is normal size, diseased.  The middle right coronary artery is 50% obstructed by multiple discrete lesions. The distal segment is normal size, diseased.  The distal right coronary artery is 90% obstructed by a discrete lesion. The distal segment is normal size, diseased.  The right posterior descending artery is 90% obstructed by a discrete lesion.  The distal segment is large size.  RADIATION DOSE DATA Procedure cumulative skin dose (mGy): 261.00 Procedure cumulative dose area product (Gy-cm**2): 20.71 Fluoro Time (Min): 9.00 CONTRAST DOSE DATA IOHEXOL 350 MG IODINE/ML INTRAVENOUS SOLUTION: 150mL  Echo 12/03/18: Final Impressions 1. ECHOCARDIOGRAM 2. Goal directed echocardiogram 30 day s/p TAVR. 3. LEFT VENTRICLE: 4. Normal left ventricular chamber size. 5. Borderline increased concentric left ventricular wall thickness. 6. Calculated 2-D linear left ventricular ejection fraction 62 %. 7. CARDIAC VALVES: 8. Normal tissue aortic prosthesis. 9. Aortic valve systolic mean Doppler gradient 11 mmHg. 10. Aortic valve peak systolic velocity is, 2.2 m/sec. 11. Trivial aortic valve prosthetic regurgitation. 12. Moderate-severe tricuspid valve regurgitation (no change compared to echo on 08/20/2018). 13. Pacemaker catheter visualized. 14. No pericardial effusion. 15. Compared to the report of 10/29/2018 the following changes have occurred: The prosthetic aortic velocity has increased slightly.  Echo 11/06/19: Final Impressions  1. Normal left ventricular chamber size and wall thickness  2. Calculated 2-D biplane volumetric left ventricular ejection fraction 70 %.  3. Abnormal ventricular septal motion due to pacing.  4. Normal right ventricular chamber size and systolic function  5. Estimated right ventricular systolic pressure 57 mmHg (systolic blood pressure 0000000 mmHg).  6. Status post 26 mm Edwards Sapien transcatheter aortic valve bioprosthesis mean gradient 12  mmHg, EOA 1.61 cm2 with trivial periprosthetic regurgitation  7. Mild-moderate mitral valve regurgitation.  8. No pericardial effusion.  9. Compared to the report of 12/03/2018 no significant change has occurred. Side by side  comparison of images performed.   Assessment / Plan: 1. History of bradycardia s/p permanent pacemaker. Good pacemaker function. Follow up  In  pacer clinic. Generator change out 2020.  2. History of atrial flutter s/p ablation. Now with permanent Atrial fibrillation. He is asymptomatic. [place] Vasc score of 2 based on age. No history of bleeding or CVA. I have recommended continuing  Eliquis 2.5 mg  Bid. He  is 89 and is borderline on renal function and weight.  3. History of aortic stenosis. S/p TAVR in September 2020. Follow up Echo in September 2021 was OK.  4. CAD with 90% distal RCA and PDA disease. Treated medically. Asymptomatic.  5. CKD stage 3a 6. Chronic anemia. Recent Hgb 8.9   I will follow up in 6 months.

## 2020-02-25 ENCOUNTER — Other Ambulatory Visit: Payer: Self-pay | Admitting: Cardiology

## 2020-02-26 ENCOUNTER — Other Ambulatory Visit: Payer: Self-pay

## 2020-02-26 ENCOUNTER — Ambulatory Visit (INDEPENDENT_AMBULATORY_CARE_PROVIDER_SITE_OTHER): Payer: Medicare Other | Admitting: Cardiology

## 2020-02-26 ENCOUNTER — Encounter: Payer: Self-pay | Admitting: Cardiology

## 2020-02-26 VITALS — BP 130/70 | HR 71 | Ht 69.0 in | Wt 156.0 lb

## 2020-02-26 DIAGNOSIS — N183 Chronic kidney disease, stage 3 unspecified: Secondary | ICD-10-CM

## 2020-02-26 DIAGNOSIS — I482 Chronic atrial fibrillation, unspecified: Secondary | ICD-10-CM | POA: Diagnosis not present

## 2020-02-26 DIAGNOSIS — I2510 Atherosclerotic heart disease of native coronary artery without angina pectoris: Secondary | ICD-10-CM

## 2020-02-26 DIAGNOSIS — Z7901 Long term (current) use of anticoagulants: Secondary | ICD-10-CM

## 2020-02-26 DIAGNOSIS — Z950 Presence of cardiac pacemaker: Secondary | ICD-10-CM | POA: Diagnosis not present

## 2020-02-26 DIAGNOSIS — Z952 Presence of prosthetic heart valve: Secondary | ICD-10-CM | POA: Diagnosis not present

## 2020-03-18 ENCOUNTER — Other Ambulatory Visit: Payer: Self-pay | Admitting: Cardiology

## 2020-03-18 NOTE — Telephone Encounter (Signed)
Prescription refill request for Eliquis received. Indication:atrial fibrillation Last office visit:1/22  Martinique Scr:1.15  12/21 Age: 87 Weight:70.8 kg  Prescription refilled

## 2020-04-02 ENCOUNTER — Other Ambulatory Visit: Payer: Self-pay | Admitting: Cardiology

## 2020-04-04 ENCOUNTER — Telehealth: Payer: Self-pay | Admitting: Cardiology

## 2020-04-04 NOTE — Telephone Encounter (Signed)
Spoke to patient's daughter [name] she stated for the past 2 weeks father has been coughing small amounts pinkish streaked phlegm.Stated he notices in mornings phlegm is bright red streaked.He has complained of being tired,no appetite.Food don't taste good.He also has been light headed.No sob.No chest pain.He refuses to see a extender.He only wants to see Dr.Jordan.Advised Dr.Jordan is out of office this week.Appointment scheduled with Dr.Jordan 3/4 at 10:00 am.

## 2020-04-04 NOTE — Telephone Encounter (Signed)
New message:    Patient daughter calling stating that her father is throw up blood. Patient daughter stating he need to see some one today. Patient heart vale may be worsening.

## 2020-04-04 NOTE — Telephone Encounter (Signed)
Returned the call to the patient's daughter. She stated that the patient has had a cough for the past few months. This past week, he started to cough up some small amounts of blood. The blood is thin and bright red. She stated that the patient has been feeling weaker lately as well. He denies shortness of breath and chest pain. The patient has known chronic anemia.  The patient's daughter is worried that this could be heart related. He does have a chronic anemia as well. He is currently taking Eliquis 2.5 mg bid.   Message will be routed to the primary doctor but she has also been made aware to call his PCP.

## 2020-04-05 ENCOUNTER — Ambulatory Visit (INDEPENDENT_AMBULATORY_CARE_PROVIDER_SITE_OTHER): Payer: Medicare Other

## 2020-04-05 DIAGNOSIS — I482 Chronic atrial fibrillation, unspecified: Secondary | ICD-10-CM

## 2020-04-05 LAB — CUP PACEART REMOTE DEVICE CHECK
Battery Remaining Longevity: 136 mo
Battery Voltage: 3.03 V
Brady Statistic RV Percent Paced: 99.91 %
Date Time Interrogation Session: 20220222010147
Implantable Lead Implant Date: 20021114
Implantable Lead Location: 753860
Implantable Lead Model: 5076
Implantable Pulse Generator Implant Date: 20200506
Lead Channel Impedance Value: 342 Ohm
Lead Channel Impedance Value: 418 Ohm
Lead Channel Pacing Threshold Amplitude: 1 V
Lead Channel Pacing Threshold Pulse Width: 0.4 ms
Lead Channel Sensing Intrinsic Amplitude: 7.75 mV
Lead Channel Sensing Intrinsic Amplitude: 7.75 mV
Lead Channel Setting Pacing Amplitude: 2 V
Lead Channel Setting Pacing Pulse Width: 0.4 ms
Lead Channel Setting Sensing Sensitivity: 0.9 mV

## 2020-04-13 NOTE — Progress Notes (Signed)
Remote pacemaker transmission.   

## 2020-04-14 NOTE — Progress Notes (Signed)
Gregory [name] Date of Birth: [birth year] Medical Record #[id number]  History of Present Illness: Gregory [name] is seen for evaluation of fatigue and coughing up pink phlegm.  He has a history of atrial flutter and pacemaker. Much of his cardiac care had been at the [facility] clinic in [place]. He reports that he had significant bradycardia in 2002 and had a pacemaker placed (Medtronic). This was revised in 2010.  He has a history of atrial flutter and had a successful ablation in the past.   When  seen by me in January 2018  he was found to be in atrial flutter and was tracking this at a high rate. He was started on Eliquis. Seen by Dr. [name] and pacemaker reprogrammed so he wouldn't track as fast. Attempted to pace terminate his Aflutter without success. When seen later in April 2018 he was found to be back in NSR. Last generator change at [facility] in May 2020.   In September 2020 he underwent TAVR with a #26 Sapien S3 Ultra valve in [place], [place]. Cardiac cath at that time showed obstructive disease in the distal RCA treated medically. Follow up Echo in October 2020 was satisfactory. He has remained in Afib. Pacemaker check in August with Dr [name] showed normal function.   He is now staying in [place] primarily. His wife passed away last year. He still takes care of multiple rental properties.   He denies any chest pain, dyspnea, or palpitations. He had follow up with [facility] clinic in [place] in the fall. CXR Ok. Ecg showed AFib with pacing. He has chronic anemia and CKD. Echo was stable.   Pacemaker check in November was normal.  He is seen with his daughter today. Notes a month ago he was having some coughing with pink phlegm production. Rarely frank blood. Did take Eliquis only once a day and it improved. When seen in Dec Hgb was down to 8.9. he is on iron therapy. Repeat Hgb at [facility] with primary care in January was 14.1. he is not sure if he is taking Protonix. No  chest pain. He is fatigued more. Daughter has made an effort to improve his dietary intake with supplements. He has gained10 lbs since Dec.     Allergies as of 04/15/2020      Reactions   Amlodipine Besylate    Other reaction(s): edema   Penicillin G Rash   Penicillins Hives, Rash      Medication List       Accurate as of April 15, 2020 10:34 AM. If you have any questions, ask your nurse or doctor.        CALCIUM PO Take 1 tablet by mouth daily.   cholecalciferol 1000 units tablet Commonly known as: VITAMIN D Take 3,000 Units by mouth daily.   Coenzyme Q10 200 MG Tabs Take 200 mg by mouth daily.   Eliquis 2.5 MG Tabs tablet Generic drug: apixaban TAKE 1 TABLET BY MOUTH TWICE A DAY   ferrous sulfate 325 (65 FE) MG EC tablet Take by mouth.   folic acid 1 MG tablet Commonly known as: FOLVITE Take by mouth.   levothyroxine 75 MCG tablet Commonly known as: SYNTHROID Take by mouth.   MAGNESIUM PO Take 1-2 tablets by mouth daily.   mirtazapine 7.5 MG tablet Commonly known as: REMERON Take 7.5 mg by mouth daily as needed.   pantoprazole 40 MG tablet Commonly known as: PROTONIX TAKE 1 TABLET BY MOUTH EVERY DAY   POTASSIUM  CHLORIDE PO Take 1 tablet by mouth daily.   vitamin B-12 1000 MCG tablet Commonly known as: CYANOCOBALAMIN Take 1,000 mcg by mouth daily.   vitamin C 500 MG tablet Commonly known as: ASCORBIC ACID Take 1,500 mg by mouth daily.        Allergies  Allergen Reactions  . Amlodipine Besylate     Other reaction(s): edema  . Penicillin G Rash  . Penicillins Hives and Rash    Past Medical History:  Diagnosis Date  . Atrial flutter ([place])    s/p ablation  . Cancer (Gregory [name])    skin cancer  . Cataracts, bilateral   . Hypertension   . Neuromuscular disorder (Gregory [name])   . Pacemaker 2010  . Thyroid disease     Past Surgical History:  Procedure Laterality Date  . APPENDECTOMY    . COLONOSCOPY    . EYE SURGERY Bilateral    cataract surgery   . HERNIA REPAIR Right   . INGUINAL HERNIA REPAIR Left 10/12/2013   Procedure: LEFT  INGUINAL HERNIA REPAIR;  Surgeon: Gregory Hollingshead, MD;  Location: [facility];  Service: General;  Laterality: Left;  . INSERTION OF MESH Left 10/12/2013   Procedure: INSERTION OF MESH;  Surgeon: Gregory Hollingshead, MD;  Location: [facility];  Service: General;  Laterality: Left;    Social History   Socioeconomic History  . Marital status: Widowed    Spouse name: Not on file  . Number of children: 2  . Years of education: Not on file  . Highest education level: Not on file  Occupational History  . Occupation: retired Actor  Tobacco Use  . Smoking status: Never Smoker  . Smokeless tobacco: Never Used  Vaping Use  . Vaping Use: Never used  Substance and Sexual Activity  . Alcohol use: No  . Drug use: No  . Sexual activity: Never  Other Topics Concern  . Not on file  Social History Narrative  . Not on file   Social Determinants of Health   Financial Resource Strain: Not on file  Food Insecurity: Not on file  Transportation Needs: Not on file  Physical Activity: Not on file  Stress: Not on file  Social Connections: Not on file    Family History  Problem Relation Age of Onset  . Heart disease Mother   . Cancer Father        leukemia    Review of Systems: As noted in HPI.  All other systems were reviewed and are negative.  Physical Exam: BP (!) 110/46   Pulse 72   Ht 5\' 9"  (1.753 m)   Wt 160 lb (72.6 kg)   SpO2 96%   BMI 23.63 kg/m  Filed Weights   04/15/20 0953  Weight: 160 lb (72.6 kg)    GENERAL:  Well appearing, elderly WM in NAD HEENT:  PERRL, EOMI, sclera are clear. Oropharynx is clear. NECK:  No jugular venous distention, carotid upstroke brisk and symmetric, no bruits, no thyromegaly or adenopathy LUNGS:  Clear to auscultation bilaterally CHEST:  Unremarkable HEART:  RRR,  PMI not displaced or sustained,S1 and S2 within normal limits, no S3, no S4: no clicks, no  rubs, he has soft 1/6 systolic murmur RUSB. ABD:  Soft, nontender. BS +, no masses or bruits. No hepatomegaly, no splenomegaly EXT:  2 + pulses throughout, no edema, no cyanosis no clubbing SKIN:  Warm and dry.  No rashes NEURO:  Alert and oriented x 3. Cranial nerves II through XII  intact. PSYCH:  Cognitively intact    LABORATORY DATA: Lab Results  Component Value Date   WBC 8.6 02/01/2020   HGB 8.9 (L) 02/01/2020   HCT 26.2 (L) 02/01/2020   PLT 131 (L) 02/01/2020   GLUCOSE 105 (H) 02/01/2020   ALT 13 02/21/2016   AST 20 02/21/2016   NA 140 02/01/2020   K 4.9 02/01/2020   CL 103 02/01/2020   CREATININE 1.15 02/01/2020   BUN 27 02/01/2020   CO2 25 02/01/2020   TSH 2.90 02/21/2016   INR 1.04 10/02/2013     Labs dated 04/19/15: cholesterol 153, triglycerides 50, LDL 87, HDL 56. Bun 29, creatinine 1.19. Other CMET normal. Hgb 10.6. Dated 05/23/16: creatinine 1.12. Other chemistries normal. Dated 05/24/17: cholesterol 168, triglycerides 65, HDL 65, LDL 90. Hgb 10.6. Creatinine 1.27. Other chemistries and TSH normal.  Dated 11/15/17: Hgb 10.2. CMET normal. Folate and B12 normal. TSH normal. Dated 09/05/18: cholesterol 178, triglycerides 39, HDL 76, LDL 94.  Dated 12/26/18: Hgb 9.4. creatinine 1.2.  Dated 11/06/19: Hgb 9.5, plts 121K creatinine 1.39. BUN 33.  Dated 02/01/20: creatinine 1.15. Hgb 8.9. otherwise BMET and CBC normal.   Ecg today showed V pacing rate 70. I have personally reviewed and interpreted this study.  Cardiac cath 09/24/18: PROCEDURE TYPES 1. CORONARY ANGIOGRAPHY 2. ULTRASOUND GUIDANCE FOR VASCULAR ACCESS FINAL DIAGNOSIS 1. Moderate coronary artery atherosclerosis PRE-PROCEDURE DIAGNOSIS 1. Stenosis Aortic Valve Acquired CORONARY DIAGNOSTIC SUMMARY Coronary artery dominance is right. Normal left main coronary.  The left main coronary artery is 10% obstructed by a discrete lesion.  The proximal left anterior descending artery is 30% obstructed by a  discrete lesion. The distal segment is normal size, diseased.  The distal circumflex artery is 50% obstructed by multiple discrete lesions. The distal segment is normal size, diseased.  The middle right coronary artery is 50% obstructed by multiple discrete lesions. The distal segment is normal size, diseased.  The distal right coronary artery is 90% obstructed by a discrete lesion. The distal segment is normal size, diseased.  The right posterior descending artery is 90% obstructed by a discrete lesion. The distal segment is large size.  RADIATION DOSE DATA Procedure cumulative skin dose (mGy): 261.00 Procedure cumulative dose area product (Gy-cm**2): 20.71 Fluoro Time (Min): 9.00 CONTRAST DOSE DATA IOHEXOL 350 MG IODINE/ML INTRAVENOUS SOLUTION: 135mL  Echo 12/03/18: Final Impressions 1. ECHOCARDIOGRAM 2. Goal directed echocardiogram 30 day s/p TAVR. 3. LEFT VENTRICLE: 4. Normal left ventricular chamber size. 5. Borderline increased concentric left ventricular wall thickness. 6. Calculated 2-D linear left ventricular ejection fraction 62 %. 7. CARDIAC VALVES: 8. Normal tissue aortic prosthesis. 9. Aortic valve systolic mean Doppler gradient 11 mmHg. 10. Aortic valve peak systolic velocity is, 2.2 m/sec. 11. Trivial aortic valve prosthetic regurgitation. 12. Moderate-severe tricuspid valve regurgitation (no change compared to echo on 08/20/2018). 13. Pacemaker catheter visualized. 14. No pericardial effusion. 15. Compared to the report of 10/29/2018 the following changes have occurred: The prosthetic aortic velocity has increased slightly.  Echo 11/06/19: Final Impressions  1. Normal left ventricular chamber size and wall thickness  2. Calculated 2-D biplane volumetric left ventricular ejection fraction 70 %.  3. Abnormal ventricular septal motion due to pacing.  4. Normal right ventricular chamber size and systolic function  5. Estimated right ventricular systolic pressure 57 mmHg  (systolic blood pressure 174 mmHg).  6. Status post 26 mm Edwards Sapien transcatheter aortic valve bioprosthesis mean gradient 12  mmHg, EOA 1.61 cm2 with trivial periprosthetic regurgitation  7.  Mild-moderate mitral valve regurgitation.  8. No pericardial effusion.  9. Compared to the report of 12/03/2018 no significant change has occurred. Side by side  comparison of images performed.   Assessment / Plan: 1. History of bradycardia s/p permanent pacemaker. Good pacemaker function. Follow up  In pacer clinic. Generator change out 2020.  2. History of atrial flutter s/p ablation. Now with permanent Atrial fibrillation. He is asymptomatic. [place] Vasc score of 2 based on age. I have recommended continuing  Eliquis 2.5 mg  Bid. He is 89 and is borderline on renal function and weight. For now his hemoptysis is fairly minimal so I would recommend continuing Rx 3. History of aortic stenosis. S/p TAVR in September 2020. Follow up Echo in September 2021 was OK.  4. CAD with 90% distal RCA and PDA disease. Treated medically. Asymptomatic.  5. CKD stage 3a 6. Chronic anemia. Last Hgb 14.1. 7. Hemoptysis. Has pooling of phlegm in throat worse in am. Recommend he take Protonix daily. Try Claritin or Allegra. He has follow up with primary care at [facility] next week. Will be seeing pulmonary at [facility] in May.   I will follow up in 6 months.

## 2020-04-15 ENCOUNTER — Other Ambulatory Visit: Payer: Self-pay

## 2020-04-15 ENCOUNTER — Ambulatory Visit (INDEPENDENT_AMBULATORY_CARE_PROVIDER_SITE_OTHER): Payer: Medicare Other | Admitting: Cardiology

## 2020-04-15 ENCOUNTER — Encounter: Payer: Self-pay | Admitting: Cardiology

## 2020-04-15 VITALS — BP 110/46 | HR 72 | Ht 69.0 in | Wt 160.0 lb

## 2020-04-15 DIAGNOSIS — Z7901 Long term (current) use of anticoagulants: Secondary | ICD-10-CM | POA: Diagnosis not present

## 2020-04-15 DIAGNOSIS — Z952 Presence of prosthetic heart valve: Secondary | ICD-10-CM

## 2020-04-15 DIAGNOSIS — Z950 Presence of cardiac pacemaker: Secondary | ICD-10-CM

## 2020-04-15 DIAGNOSIS — R042 Hemoptysis: Secondary | ICD-10-CM

## 2020-04-15 DIAGNOSIS — I2510 Atherosclerotic heart disease of native coronary artery without angina pectoris: Secondary | ICD-10-CM

## 2020-04-15 NOTE — Addendum Note (Signed)
Addended by: Kathyrn Lass on: 04/15/2020 10:44 AM   Modules accepted: Orders

## 2020-04-20 ENCOUNTER — Other Ambulatory Visit: Payer: Self-pay | Admitting: Cardiology

## 2020-05-26 ENCOUNTER — Ambulatory Visit: Payer: Medicare Other | Admitting: Cardiology

## 2020-06-09 NOTE — Progress Notes (Signed)
Cardiology Office Note   Date:  06/10/2020   ID:  Gregory [name], DOB 1920/07/26, MRN [id number]  PCP:  Orpah Greek, DO  Cardiologist: Dr. Martinique  CC: Follow Up   History of Present Illness: Gregory [name] is a 86 y.o. male who presents for ongoing assessment and management of atrial flutter, pacemaker in situ, with history of majority of cardiac care being completed at the [facility] in [place].  He does have a Medtronic pacemaker placed in the setting of symptomatic bradycardia in 2000/05/29 which was revised in 05-29-2008.  He also has had a history of a successful ablation for atrial flutter.  He also has a history of CAD with 90% distal RCA and PDA disease treated medically.  In 2016/05/29 EKG revealed that he was in atrial flutter and was tracking at a high rate.  He was started on Eliquis and followed by EP, Dr. [name], with his pacemaker being reprogrammed so that it would not track as fast.  They attempted to pacer terminate his atrial flutter without success.  On follow-up in 2022/05/30 of the same year he was found to be back in normal sinus rhythm.  Dr. Martinique noted that his most recent pacemaker generator change was at the [facility] in [place] in May 2020.  Other history includes a TAVR completed in September 2020 with a #26 SAPIEN S3 ultra valve, which was completed in the [facility] in [place].  It was noted that his cardiac catheterization at that time revealed no obstructive disease in the distal RCA and he was treated medically.  The patient is now primarily staying in [place], his wife passed away in May 30, 2019.  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  Last seen by Dr. Martinique on 04/15/2020 with complaints of coughing up pink phlegm production but rarely bright red blood.  He decreased his use of Eliquis from twice daily to daily and the symptoms improved.  Dr. Martinique recommended that he continue taking Eliquis 2.5 mg twice daily  as his hemoptysis was minimal.  CHA2DS2-VASc score of 2 based on age.  He remained in permanent atrial fibrillation at assessment.  He was recommended to try taking Protonix daily and also antihistamine such as Claritin or Allegra for postnasal drip.  He was to follow-up with his primary care physician at [facility] and with pulmonary at the [facility] in May 2022.  He comes today without cardiac complaints.  He does appear frail and has begun to use a cane with ambulation.  He states that he has no appetite.  He is drinking supplements, Ensure and taking vitamins.  He is also on iron replacement.  He is unhappy about the fact that he does not feel as strong as he used in his youth.  He is medically compliant.  He denies any bleeding or excessive bruising.  He does not have any discolored phlegm as in the past.  He thinks it is from the strawberry Ensure that he drinks.   Past Medical History:  Diagnosis Date  . Atrial flutter ([place])    s/p ablation  . Cancer ([place])    skin cancer  . Cataracts, bilateral   . Hypertension   . Neuromuscular disorder ([place])   . Pacemaker 05-29-2008  . Thyroid disease     Past Surgical History:  Procedure Laterality Date  . APPENDECTOMY    . COLONOSCOPY    . EYE SURGERY Bilateral    cataract surgery  .  HERNIA REPAIR Right   . INGUINAL HERNIA REPAIR Left 10/12/2013   Procedure: LEFT  INGUINAL HERNIA REPAIR;  Surgeon: Odis Hollingshead, MD;  Location: [facility];  Service: General;  Laterality: Left;  . INSERTION OF MESH Left 10/12/2013   Procedure: INSERTION OF MESH;  Surgeon: Odis Hollingshead, MD;  Location: [facility];  Service: General;  Laterality: Left;     Current Outpatient Medications  Medication Sig Dispense Refill  . CALCIUM PO Take 1 tablet by mouth daily.     . cholecalciferol (VITAMIN D) 1000 UNITS tablet Take 3,000 Units by mouth daily.     . Coenzyme Q10 200 MG TABS Take 200 mg by mouth daily.     Marland Kitchen ELIQUIS 2.5 MG TABS tablet TAKE 1 TABLET BY MOUTH  TWICE A DAY 240 tablet 1  . folic acid (FOLVITE) 1 MG tablet Take by mouth.    . levothyroxine (SYNTHROID) 75 MCG tablet Take by mouth.    Marland Kitchen MAGNESIUM PO Take 1-2 tablets by mouth daily.     . mirtazapine (REMERON) 7.5 MG tablet Take 7.5 mg by mouth daily as needed.    . pantoprazole (PROTONIX) 40 MG tablet TAKE 1 TABLET BY MOUTH EVERY DAY 30 tablet 10  . POTASSIUM CHLORIDE PO Take 1 tablet by mouth daily.    . vitamin B-12 (CYANOCOBALAMIN) 1000 MCG tablet Take 1,000 mcg by mouth daily.     . vitamin C (ASCORBIC ACID) 500 MG tablet Take 1,500 mg by mouth daily.     . ferrous sulfate 325 (65 FE) MG EC tablet Take by mouth.     No current facility-administered medications for this visit.    Allergies:   Amlodipine besylate, Penicillin g, and Penicillins    Social History:  The patient  reports that he has never smoked. He has never used smokeless tobacco. He reports that he does not drink alcohol and does not use drugs.   Family History:  The patient's family history includes Cancer in his father; Heart disease in his mother.    ROS: All other systems are reviewed and negative. Unless otherwise mentioned in H&P    PHYSICAL EXAM: VS:  BP (!) 169/70   Pulse 74   Ht 5\' 9"  (1.753 m)   Wt 165 lb (74.8 kg)   SpO2 98%   BMI 24.37 kg/m  , BMI Body mass index is 24.37 kg/m. GEN: Well nourished, well developed, in no acute distress, appears somewhat frail HEENT: normal Neck: no JVD, carotid bruits, or masses Cardiac: IRRR; no murmurs, rubs, or gallops,no edema  Respiratory:  Clear to auscultation bilaterally, normal work of breathing, occasional upper airway congestion cleared with cough GI: soft, nontender, nondistended, + BS MS: no deformity or atrophy Skin: warm and dry, no rash Neuro:  Strength and sensation are intact, hard of hearing Psych: euthymic mood, full affect   EKG: Not completed this office visit  Recent Labs: 02/01/2020: BUN 27; Creatinine, Ser 1.15; Hemoglobin  8.9; Platelets 131; Potassium 4.9; Sodium 140    Lipid Panel No results found for: CHOL, TRIG, HDL, CHOLHDL, VLDL, LDLCALC, LDLDIRECT    Wt Readings from Last 3 Encounters:  06/10/20 165 lb (74.8 kg)  04/15/20 160 lb (72.6 kg)  02/26/20 156 lb (70.8 kg)      Other studies Reviewed: Cardiac cath 09/24/18: PROCEDURE TYPES 1. CORONARY ANGIOGRAPHY 2. ULTRASOUND GUIDANCE FOR VASCULAR ACCESS FINAL DIAGNOSIS 1. Moderate coronary artery atherosclerosis PRE-PROCEDURE DIAGNOSIS 1. Stenosis Aortic Valve Acquired CORONARY DIAGNOSTIC SUMMARY Coronary  artery dominance is right. Normal left main coronary.  The left main coronary artery is 10% obstructed by a discrete lesion.  The proximal left anterior descending artery is 30% obstructed by a discrete lesion. The distal segment is normal size, diseased.  The distal circumflex artery is 50% obstructed by multiple discrete lesions. The distal segment is normal size, diseased.  The middle right coronary artery is 50% obstructed by multiple discrete lesions. The distal segment is normal size, diseased.  The distal right coronary artery is 90% obstructed by a discrete lesion. The distal segment is normal size, diseased.  The right posterior descending artery is 90% obstructed by a discrete lesion. The distal segment is large size.  RADIATION DOSE DATA Procedure cumulative skin dose (mGy): 261.00 Procedure cumulative dose area product (Gy-cm**2): 20.71 Fluoro Time (Min): 9.00 CONTRAST DOSE DATA IOHEXOL 350 MG IODINE/ML INTRAVENOUS SOLUTION: 182mL  Echo 12/03/18: Final Impressions 1. ECHOCARDIOGRAM 2. Goal directed echocardiogram 30 day s/p TAVR. 3. LEFT VENTRICLE: 4. Normal left ventricular chamber size. 5. Borderline increased concentric left ventricular wall thickness. 6. Calculated 2-D linear left ventricular ejection fraction 62 %. 7. CARDIAC VALVES: 8. Normal tissue aortic prosthesis. 9. Aortic valve systolic mean Doppler gradient  11 mmHg. 10. Aortic valve peak systolic velocity is, 2.2 m/sec. 11. Trivial aortic valve prosthetic regurgitation. 12. Moderate-severe tricuspid valve regurgitation (no change compared to echo on 08/20/2018). 13. Pacemaker catheter visualized. 14. No pericardial effusion. 15. Compared to the report of 10/29/2018 the following changes have occurred: The prosthetic aortic velocity has increased slightly.  Echo 11/06/19: Final Impressions  1. Normal left ventricular chamber size and wall thickness  2. Calculated 2-D biplane volumetric left ventricular ejection fraction 70 %.  3. Abnormal ventricular septal motion due to pacing.  4. Normal right ventricular chamber size and systolic function  5. Estimated right ventricular systolic pressure 57 mmHg (systolic blood pressure 361 mmHg).  6. Status post 26 mm Edwards Sapien transcatheter aortic valve bioprosthesis mean gradient 12  mmHg, EOA 1.61 cm2 with trivial periprosthetic regurgitation  7. Mild-moderate mitral valve regurgitation.  8. No pericardial effusion.  9. Compared to the report of 12/03/2018 no significant change has occurred. Side by side  comparison of images performed.  ASSESSMENT AND PLAN:  1.  Coronary artery disease: Denies any recurrent chest pain, shortness of breath, dizziness, but complains of generalized fatigue.  He is medically compliant.  No plan cardiac testing at this time.  No longer on statin therapy.  2.  Atrial flutter status post ablation, with A. fib no complaints of palpitations or racing heart rate.  He remains on Eliquis.  Does not complain of any pink-colored phlegm.  No changes in his regimen.  3.  PPM in situ: SSS with MDT PTVDP.  Generator change in [place].  Followed remotely.    4.  Aortic valve disease: Status post TAVR September 2020.  No plan testing at this time.  Current medicines are reviewed at length with the patient today.  I have spent 25 min's dedicated to the care of this  patient on the date of this encounter to include pre-visit review of records, assessment, management and diagnostic testing,with shared decision making.  Labs/ tests ordered today include: None Phill Myron. [place] Pugh, ANP, AACC   06/10/2020 4:27 PM    [facility] [place] [address] Office [phone] Fax [phone]  Notice: This dictation was prepared with Dragon dictation along with smaller phrase technology. Any transcriptional errors that  result from this process are unintentional and may not be corrected upon review.

## 2020-06-10 ENCOUNTER — Other Ambulatory Visit: Payer: Self-pay

## 2020-06-10 ENCOUNTER — Ambulatory Visit (INDEPENDENT_AMBULATORY_CARE_PROVIDER_SITE_OTHER): Payer: Medicare Other | Admitting: Adult Health

## 2020-06-10 ENCOUNTER — Encounter: Payer: Self-pay | Admitting: Adult Health

## 2020-06-10 VITALS — BP 169/70 | HR 74 | Ht 69.0 in | Wt 165.0 lb

## 2020-06-10 DIAGNOSIS — Z952 Presence of prosthetic heart valve: Secondary | ICD-10-CM | POA: Diagnosis not present

## 2020-06-10 DIAGNOSIS — I2510 Atherosclerotic heart disease of native coronary artery without angina pectoris: Secondary | ICD-10-CM | POA: Diagnosis not present

## 2020-06-10 DIAGNOSIS — Z950 Presence of cardiac pacemaker: Secondary | ICD-10-CM | POA: Diagnosis not present

## 2020-06-10 DIAGNOSIS — I4811 Longstanding persistent atrial fibrillation: Secondary | ICD-10-CM | POA: Diagnosis not present

## 2020-06-10 NOTE — Patient Instructions (Signed)
Medication Instructions:  The current medical regimen is effective;  continue present plan and medications as directed. Please refer to the Current Medication list given to you today.  *If you need a refill on your cardiac medications before your next appointment, please call your pharmacy*  Lab Work:   Testing/Procedures:  NONE    NONE  Follow-Up: Your next appointment:  6 month(s) In Person with Quay Burow, MD OR IF UNAVAILABLE Jory Sims, DNP   Please call our office 2 months in advance to schedule this appointment   At [facility], you and your health needs are our priority.  As part of our continuing mission to provide you with exceptional heart care, we have created designated Provider Care Teams.  These Care Teams include your primary Cardiologist (physician) and Advanced Practice Providers (APPs -  Physician Assistants and Nurse Practitioners) who all work together to provide you with the care you need, when you need it.

## 2020-07-05 ENCOUNTER — Ambulatory Visit (INDEPENDENT_AMBULATORY_CARE_PROVIDER_SITE_OTHER): Payer: Medicare Other

## 2020-07-05 DIAGNOSIS — I442 Atrioventricular block, complete: Secondary | ICD-10-CM

## 2020-07-05 LAB — CUP PACEART REMOTE DEVICE CHECK
Battery Remaining Longevity: 133 mo
Battery Voltage: 3.02 V
Brady Statistic RV Percent Paced: 99.97 %
Date Time Interrogation Session: 20220524014653
Implantable Lead Implant Date: 20021114
Implantable Lead Location: 753860
Implantable Lead Model: 5076
Implantable Pulse Generator Implant Date: 20200506
Lead Channel Impedance Value: 342 Ohm
Lead Channel Impedance Value: 418 Ohm
Lead Channel Pacing Threshold Amplitude: 1 V
Lead Channel Pacing Threshold Pulse Width: 0.4 ms
Lead Channel Sensing Intrinsic Amplitude: 9 mV
Lead Channel Sensing Intrinsic Amplitude: 9 mV
Lead Channel Setting Pacing Amplitude: 2 V
Lead Channel Setting Pacing Pulse Width: 0.4 ms
Lead Channel Setting Sensing Sensitivity: 0.9 mV

## 2020-07-27 NOTE — Progress Notes (Signed)
Remote pacemaker transmission.   

## 2020-07-28 DIAGNOSIS — E43 Unspecified severe protein-calorie malnutrition: Secondary | ICD-10-CM | POA: Insufficient documentation

## 2020-09-12 ENCOUNTER — Other Ambulatory Visit: Payer: Self-pay | Admitting: Cardiology

## 2020-09-12 NOTE — Telephone Encounter (Signed)
Dr Martinique, patient Scr has been consistently <1.5 and weight has been trending up.  Continue low dose Eliquis or increase to '5mg'$  BID?

## 2020-09-12 NOTE — Telephone Encounter (Signed)
Prescription refill request for Eliquis received. QUALIFIES FOR '5MG'$  routing to pharmd pool Indication:AFIB Last office visit:LAWRENCE 06/10/20 Scr:1.1 08/19/20 Age: 78M Weight:74.8KG

## 2020-10-04 ENCOUNTER — Ambulatory Visit (INDEPENDENT_AMBULATORY_CARE_PROVIDER_SITE_OTHER): Payer: Medicare Other

## 2020-10-04 DIAGNOSIS — I442 Atrioventricular block, complete: Secondary | ICD-10-CM

## 2020-10-04 LAB — CUP PACEART REMOTE DEVICE CHECK
Battery Remaining Longevity: 131 mo
Battery Voltage: 3.02 V
Brady Statistic RV Percent Paced: 99.98 %
Date Time Interrogation Session: 20220823020457
Implantable Lead Implant Date: 20021114
Implantable Lead Location: 753860
Implantable Lead Model: 5076
Implantable Pulse Generator Implant Date: 20200506
Lead Channel Impedance Value: 380 Ohm
Lead Channel Impedance Value: 437 Ohm
Lead Channel Pacing Threshold Amplitude: 1 V
Lead Channel Pacing Threshold Pulse Width: 0.4 ms
Lead Channel Sensing Intrinsic Amplitude: 9 mV
Lead Channel Sensing Intrinsic Amplitude: 9 mV
Lead Channel Setting Pacing Amplitude: 2 V
Lead Channel Setting Pacing Pulse Width: 0.4 ms
Lead Channel Setting Sensing Sensitivity: 0.9 mV

## 2020-10-08 NOTE — Progress Notes (Signed)
[place] Date of Birth: [birth year] Medical Record [id number]  History of Present Illness: [name] is seen for evaluation of fatigue and coughing up pink phlegm.  He has a history of atrial flutter and pacemaker. Much of his cardiac care had been at the [facility] clinic in [place]. He reports that he had significant bradycardia in 2002 and had a pacemaker placed ([employer]tr[employer]). This was revised in 2010.  He has a history of atrial flutter and had a successful ablation in the past.   When  seen by me in January 2018  he was found to be in atrial flutter and was tracking this at a high rate. He was started on Eliquis. Seen by Dr. [name] and pacemaker reprogrammed so he wouldn't track as fast. Attempted to pace terminate his Aflutter without success. When seen later in April 2018 he was found to be back in NSR. Last generator change at [facility] in May 2020.   In September 2020 he underwent TAVR with a #26 Sapien S3 Ultra valve in [place], [place]. Cardiac cath at that time showed obstructive disease in the distal RCA treated medically. Follow up Echo in October 2020 was satisfactory. He has remained in Afib. Pacemaker check in August with Dr [name] showed normal function.   He is now staying in [place] primarily. He is widowed.  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    He had follow up with [facility] clinic in [place] in the fall. CXR Ok. Ecg showed AFib with pacing. He has chronic anemia and CKD. Echo was stable.   He was admitted at [facility] in June with Covid 19 PNA. Treated with 3 days of remdesivir.   Since Covid his appetite is very poor. Nothing tastes good. Using some Ensure supplements. Placed on Lexapro but hasn't helped. Denies dyspnea, still has some cough but no hemoptysis. He has lost 15 lbs since April. Activity is less.      Allergies as of 10/11/2020       Reactions   Amlodipine Besylate    Other reaction(s): edema   Penicillin G Rash    Penicillins Hives, Rash        Medication List        Accurate as of October 11, 2020  2:31 PM. If you have any questions, ask your nurse or doctor.          STOP taking these medications    CALCIUM PO Stopped by: Milton Streicher Martinique, MD   cholecalciferol 1000 units tablet Commonly known as: VITAMIN D Stopped by: Kathline Banbury [place]ique, MD   POTASSIUM CHLORIDE PO Stopped by: Toneshia Coello [place], MD       TAKE these medications    Coenzyme Q10 200 MG Tabs Take 200 mg by mouth daily.   Eliquis 2.5 MG Tabs tablet Generic drug: apixaban TAKE 1 TABLET BY MOUTH TWICE A DAY   escitalopram 5 MG tablet Commonly known as: LEXAPRO Take 5 mg by mouth daily.   ferrous sulfate 325 (65 FE) MG EC tablet Take by mouth.   folic acid 1 MG tablet Commonly known as: FOLVITE Take by mouth.   levothyroxine 75 MCG tablet Commonly known as: SYNTHROID Take by mouth.   MAGNESIUM PO Take 1-2 tablets by mouth daily.   mirtazapine 7.5 MG tablet Commonly known as: REMERON Take 7.5 mg by mouth daily as needed.   pantoprazole 40 MG tablet Commonly known as: PROTONIX TAKE 1 TABLET BY MOUTH EVERY DAY   vitamin  B-12 1000 MCG tablet Commonly known as: CYANOCOBALAMIN Take 1,000 mcg by mouth daily.   vitamin C 500 MG tablet Commonly known as: ASCORBIC ACID Take 1,500 mg by mouth daily.         Allergies  Allergen Reactions   Amlodipine Besylate     Other reaction(s): edema   Penicillin G Rash   Penicillins Hives and Rash    Past Medical History:  Diagnosis Date   Atrial flutter (HCC)    s/p ablation   Cancer ([place])    skin cancer   Cataracts, bilateral    Hypertension    Neuromuscular disorder ([place])    Pacemaker 2010   Thyroid disease     Past Surgical History:  Procedure Laterality Date   APPENDECTOMY     COLONOSCOPY     EYE SURGERY Bilateral    cataract surgery   HERNIA REPAIR Right    INGUINAL HERNIA REPAIR Left 10/12/2013   Procedure: LEFT  INGUINAL HERNIA REPAIR;   Surgeon: Odis Hollingshead, MD;  Location: [place];  Service: General;  Laterality: Left;   INSERTION OF MESH Left 10/12/2013   Procedure: INSERTION OF MESH;  Surgeon: Odis Hollingshead, MD;  Location: [place];  Service: General;  Laterality: Left;    Social History   Socioeconomic History   Marital status: Widowed    Spouse name: Not on file   Number of children: 2   Years of education: Not on file   Highest education level: Not on file  Occupational History   Occupation: retired Actor  Tobacco Use   Smoking status: Never   Smokeless tobacco: Never  Vaping Use   Vaping Use: Never used  Substance and Sexual Activity   Alcohol use: No   Drug use: No   Sexual activity: Never  Other Topics Concern   Not on file  Social History Narrative   Not on file   Social Determinants of Health   Financial Resource Strain: Not on file  Food Insecurity: Not on file  Transportation Needs: Not on file  Physical Activity: Not on file  Stress: Not on file  Social Connections: Not on file    Family History  Problem Relation Age of Onset   Heart disease Mother    Cancer Father        leukemia    Review of Systems: As noted in HPI.  All other systems were reviewed and are negative.  Physical Exam: BP 130/60   Pulse 76   Resp 18   Ht '5\' 8"'$  (1.727 m)   Wt 150 lb (68 kg)   SpO2 96%   BMI 22.81 kg/m  Filed Weights   10/11/20 1414  Weight: 150 lb (68 kg)     GENERAL:  Well appearing, elderly WM in NAD HEENT:  PERRL, EOMI, sclera are clear. Oropharynx is clear. NECK:  No jugular venous distention, carotid upstroke brisk and symmetric, no bruits, no thyromegaly or adenopathy LUNGS:  Clear to auscultation bilaterally CHEST:  Unremarkable HEART:  RRR,  PMI not displaced or sustained,S1 and S2 within normal limits, no S3, no S4: no clicks, no rubs, he has soft 1/6 systolic murmur RUSB. There is a gr 2/6 diastolic murmur upper RSB. ABD:  Soft, nontender. BS +, no masses or  bruits. No hepatomegaly, no splenomegaly EXT:  2 + pulses throughout, no edema, no cyanosis no clubbing SKIN:  Warm and dry.  No rashes NEURO:  Alert and oriented x 3. Cranial nerves II through  XII intact. PSYCH:  Cognitively intact    LABORATORY DATA: Lab Results  Component Value Date   WBC 8.6 02/01/2020   HGB 8.9 (L) 02/01/2020   HCT 26.2 (L) 02/01/2020   PLT 131 (L) 02/01/2020   GLUCOSE 105 (H) 02/01/2020   ALT 13 02/21/2016   AST 20 02/21/2016   NA 140 02/01/2020   K 4.9 02/01/2020   CL 103 02/01/2020   CREATININE 1.15 02/01/2020   BUN 27 02/01/2020   CO2 25 02/01/2020   TSH 2.90 02/21/2016   INR 1.04 10/02/2013     Labs dated 04/19/15: cholesterol 153, triglycerides 50, LDL 87, HDL 56. Bun 29, creatinine 1.19. Other CMET normal. Hgb 10.6. Dated 05/23/16: creatinine 1.12. Other chemistries normal. Dated 05/24/17: cholesterol 168, triglycerides 65, HDL 65, LDL 90. Hgb 10.6. Creatinine 1.27. Other chemistries and TSH normal.  Dated 11/15/17: Hgb 10.2. CMET normal. Folate and B12 normal. TSH normal. Dated 09/05/18: cholesterol 178, triglycerides 39, HDL 76, LDL 94.  Dated 12/26/18: Hgb 9.4. creatinine 1.2.  Dated 11/06/19: Hgb 9.5, plts 121K creatinine 1.39. BUN 33.  Dated 02/01/20: creatinine 1.15. Hgb 8.9. otherwise BMET and CBC normal.  Dated 08/19/20: Hgb 9.8, plts 123K. CMET normal.   Cardiac cath 09/24/18: PROCEDURE TYPES 1.  CORONARY ANGIOGRAPHY 2.  ULTRASOUND GUIDANCE FOR VASCULAR ACCESS FINAL DIAGNOSIS 1.  Moderate coronary artery atherosclerosis PRE-PROCEDURE DIAGNOSIS 1.  Stenosis Aortic Valve Acquired CORONARY DIAGNOSTIC SUMMARY Coronary artery dominance is right. Normal left main coronary.  The left main coronary artery is 10% obstructed by a discrete lesion.  The proximal left anterior descending artery is 30% obstructed by a discrete lesion. The distal segment is normal size, diseased.  The distal circumflex artery is 50% obstructed by multiple discrete  lesions. The distal segment is normal size, diseased.  The middle right coronary artery is 50% obstructed by multiple discrete lesions. The distal segment is normal size, diseased.  The distal right coronary artery is 90% obstructed by a discrete lesion. The distal segment is normal size, diseased.  The right posterior descending artery is 90% obstructed by a discrete lesion. The distal segment is large size.  RADIATION DOSE DATA Procedure cumulative skin dose (mGy): 261.00 Procedure cumulative dose area product (Gy-cm**2): 20.71 Fluoro Time (Min): 9.00 CONTRAST DOSE DATA IOHEXOL 350 MG IODINE/ML INTRAVENOUS SOLUTION: 186m  Echo 12/03/18: Final Impressions 1. ECHOCARDIOGRAM 2. Goal directed echocardiogram 30 day s/p TAVR. 3. LEFT VENTRICLE: 4. Normal left ventricular chamber size. 5. Borderline increased concentric left ventricular wall thickness. 6. Calculated 2-D linear left ventricular ejection fraction 62 %. 7. CARDIAC VALVES: 8. Normal tissue aortic prosthesis. 9. Aortic valve systolic mean Doppler gradient 11 mmHg. 10. Aortic valve peak systolic velocity is, 2.2 m/sec. 11. Trivial aortic valve prosthetic regurgitation. 12. Moderate-severe tricuspid valve regurgitation (no change compared to echo on 08/20/2018). 13. Pacemaker catheter visualized. 14. No pericardial effusion. 15. Compared to the report of 10/29/2018 the following changes have occurred: The prosthetic aortic velocity has increased slightly.  Echo 11/06/19: Final Impressions  1. Normal left ventricular chamber size and wall thickness  2. Calculated 2-D biplane volumetric left ventricular ejection fraction 70 %.  3. Abnormal ventricular septal motion due to pacing.  4. Normal right ventricular chamber size and systolic function  5. Estimated right ventricular systolic pressure 57 mmHg (systolic blood pressure 10000000mmHg).  6. Status post 26 mm Edwards Sapien transcatheter aortic valve bioprosthesis mean gradient 12   mmHg, EOA 1.61 cm2 with trivial periprosthetic regurgitation  7. Mild-moderate mitral  valve regurgitation.  8. No pericardial effusion.  9. Compared to the report of 12/03/2018 no significant change has occurred. Side by side  comparison of images performed.   Assessment / Plan: 1. History of bradycardia s/p permanent pacemaker. Good pacemaker function- nust checked last week. Follow up  In pacer clinic. Generator change out 2020.  2. History of atrial flutter s/p ablation. Now with permanent Atrial fibrillation. He is asymptomatic. [place] Vasc score of 2 based on age. Continue  Eliquis 2.5 mg  Bid. He is 89 and is borderline on renal function and weight.  3. History of aortic stenosis. S/p TAVR in September 2020. Follow up Echo in September 2021 was OK. By exam he has some degree of AI  4. CAD with 90% distal RCA and PDA disease. Treated medically. Asymptomatic.  5. CKD stage 3a 6. Chronic anemia. Last Hgb 14.1. 7. Hemoptysis. Resolved. 8. Covid 19 PNA 9. Weight loss- involutional since Covid. Encourage increased calorie intake.    Follow up in 6 months.

## 2020-10-11 ENCOUNTER — Encounter: Payer: Self-pay | Admitting: Cardiology

## 2020-10-11 ENCOUNTER — Ambulatory Visit (INDEPENDENT_AMBULATORY_CARE_PROVIDER_SITE_OTHER): Payer: Medicare Other | Admitting: Cardiology

## 2020-10-11 ENCOUNTER — Other Ambulatory Visit: Payer: Self-pay

## 2020-10-11 VITALS — BP 130/60 | HR 76 | Resp 18 | Ht 68.0 in | Wt 150.0 lb

## 2020-10-11 DIAGNOSIS — Z7901 Long term (current) use of anticoagulants: Secondary | ICD-10-CM

## 2020-10-11 DIAGNOSIS — Z952 Presence of prosthetic heart valve: Secondary | ICD-10-CM

## 2020-10-11 DIAGNOSIS — I2510 Atherosclerotic heart disease of native coronary artery without angina pectoris: Secondary | ICD-10-CM | POA: Diagnosis not present

## 2020-10-11 DIAGNOSIS — I442 Atrioventricular block, complete: Secondary | ICD-10-CM | POA: Diagnosis not present

## 2020-10-11 DIAGNOSIS — Z950 Presence of cardiac pacemaker: Secondary | ICD-10-CM

## 2020-10-11 DIAGNOSIS — I482 Chronic atrial fibrillation, unspecified: Secondary | ICD-10-CM

## 2020-10-19 NOTE — Progress Notes (Signed)
Remote pacemaker transmission.   

## 2021-01-03 ENCOUNTER — Ambulatory Visit (INDEPENDENT_AMBULATORY_CARE_PROVIDER_SITE_OTHER): Payer: Medicare Other

## 2021-01-03 DIAGNOSIS — I442 Atrioventricular block, complete: Secondary | ICD-10-CM | POA: Diagnosis not present

## 2021-01-03 LAB — CUP PACEART REMOTE DEVICE CHECK
Battery Remaining Longevity: 128 mo
Battery Voltage: 3.02 V
Brady Statistic RV Percent Paced: 99.95 %
Date Time Interrogation Session: 20221122011112
Implantable Lead Implant Date: 20021114
Implantable Lead Location: 753860
Implantable Lead Model: 5076
Implantable Pulse Generator Implant Date: 20200506
Lead Channel Impedance Value: 361 Ohm
Lead Channel Impedance Value: 437 Ohm
Lead Channel Pacing Threshold Amplitude: 1 V
Lead Channel Pacing Threshold Pulse Width: 0.4 ms
Lead Channel Sensing Intrinsic Amplitude: 9 mV
Lead Channel Sensing Intrinsic Amplitude: 9 mV
Lead Channel Setting Pacing Amplitude: 2 V
Lead Channel Setting Pacing Pulse Width: 0.4 ms
Lead Channel Setting Sensing Sensitivity: 0.9 mV

## 2021-01-12 NOTE — Progress Notes (Signed)
Remote pacemaker transmission.   

## 2021-02-23 ENCOUNTER — Telehealth: Payer: Self-pay

## 2021-02-23 NOTE — Telephone Encounter (Signed)
Attempted to contact patient's daughter [name] to schedule a Palliative Care consult appointment. No answer left a message to return call.

## 2021-03-02 ENCOUNTER — Inpatient Hospital Stay (HOSPITAL_COMMUNITY): Payer: Medicare Other

## 2021-03-02 ENCOUNTER — Emergency Department (HOSPITAL_COMMUNITY): Payer: Medicare Other

## 2021-03-02 ENCOUNTER — Inpatient Hospital Stay (HOSPITAL_COMMUNITY)
Admission: EM | Admit: 2021-03-02 | Discharge: 2021-03-10 | DRG: 377 | Disposition: A | Discharge status: Rehabilitation Facility | Payer: Medicare Other | Attending: Internal Medicine | Admitting: Internal Medicine

## 2021-03-02 ENCOUNTER — Other Ambulatory Visit: Payer: Self-pay

## 2021-03-02 ENCOUNTER — Encounter (HOSPITAL_COMMUNITY): Payer: Self-pay

## 2021-03-02 DIAGNOSIS — Z950 Presence of cardiac pacemaker: Secondary | ICD-10-CM | POA: Diagnosis not present

## 2021-03-02 DIAGNOSIS — R195 Other fecal abnormalities: Secondary | ICD-10-CM | POA: Diagnosis not present

## 2021-03-02 DIAGNOSIS — Y92239 Unspecified place in hospital as the place of occurrence of the external cause: Secondary | ICD-10-CM | POA: Diagnosis not present

## 2021-03-02 DIAGNOSIS — Z20822 Contact with and (suspected) exposure to covid-19: Secondary | ICD-10-CM | POA: Diagnosis present

## 2021-03-02 DIAGNOSIS — D62 Acute posthemorrhagic anemia: Secondary | ICD-10-CM | POA: Diagnosis present

## 2021-03-02 DIAGNOSIS — N170 Acute kidney failure with tubular necrosis: Secondary | ICD-10-CM | POA: Diagnosis present

## 2021-03-02 DIAGNOSIS — E43 Unspecified severe protein-calorie malnutrition: Secondary | ICD-10-CM | POA: Diagnosis present

## 2021-03-02 DIAGNOSIS — L89626 Pressure-induced deep tissue damage of left heel: Secondary | ICD-10-CM | POA: Diagnosis present

## 2021-03-02 DIAGNOSIS — I2510 Atherosclerotic heart disease of native coronary artery without angina pectoris: Secondary | ICD-10-CM | POA: Diagnosis present

## 2021-03-02 DIAGNOSIS — Z7901 Long term (current) use of anticoagulants: Secondary | ICD-10-CM | POA: Diagnosis not present

## 2021-03-02 DIAGNOSIS — Z888 Allergy status to other drugs, medicaments and biological substances status: Secondary | ICD-10-CM

## 2021-03-02 DIAGNOSIS — Z952 Presence of prosthetic heart valve: Secondary | ICD-10-CM

## 2021-03-02 DIAGNOSIS — Z8249 Family history of ischemic heart disease and other diseases of the circulatory system: Secondary | ICD-10-CM

## 2021-03-02 DIAGNOSIS — Z85828 Personal history of other malignant neoplasm of skin: Secondary | ICD-10-CM

## 2021-03-02 DIAGNOSIS — I442 Atrioventricular block, complete: Secondary | ICD-10-CM | POA: Diagnosis present

## 2021-03-02 DIAGNOSIS — N390 Urinary tract infection, site not specified: Secondary | ICD-10-CM | POA: Diagnosis not present

## 2021-03-02 DIAGNOSIS — Z931 Gastrostomy status: Secondary | ICD-10-CM

## 2021-03-02 DIAGNOSIS — K921 Melena: Secondary | ICD-10-CM

## 2021-03-02 DIAGNOSIS — I959 Hypotension, unspecified: Secondary | ICD-10-CM | POA: Diagnosis not present

## 2021-03-02 DIAGNOSIS — E46 Unspecified protein-calorie malnutrition: Secondary | ICD-10-CM | POA: Diagnosis not present

## 2021-03-02 DIAGNOSIS — N189 Chronic kidney disease, unspecified: Secondary | ICD-10-CM | POA: Diagnosis present

## 2021-03-02 DIAGNOSIS — K5901 Slow transit constipation: Secondary | ICD-10-CM | POA: Diagnosis not present

## 2021-03-02 DIAGNOSIS — Z8616 Personal history of COVID-19: Secondary | ICD-10-CM

## 2021-03-02 DIAGNOSIS — R1310 Dysphagia, unspecified: Secondary | ICD-10-CM | POA: Diagnosis not present

## 2021-03-02 DIAGNOSIS — Z79899 Other long term (current) drug therapy: Secondary | ICD-10-CM | POA: Diagnosis not present

## 2021-03-02 DIAGNOSIS — E860 Dehydration: Secondary | ICD-10-CM | POA: Diagnosis present

## 2021-03-02 DIAGNOSIS — N1831 Chronic kidney disease, stage 3a: Secondary | ICD-10-CM | POA: Diagnosis present

## 2021-03-02 DIAGNOSIS — Z79890 Hormone replacement therapy (postmenopausal): Secondary | ICD-10-CM | POA: Diagnosis not present

## 2021-03-02 DIAGNOSIS — L89891 Pressure ulcer of other site, stage 1: Secondary | ICD-10-CM | POA: Diagnosis present

## 2021-03-02 DIAGNOSIS — E039 Hypothyroidism, unspecified: Secondary | ICD-10-CM | POA: Diagnosis not present

## 2021-03-02 DIAGNOSIS — R578 Other shock: Secondary | ICD-10-CM | POA: Diagnosis present

## 2021-03-02 DIAGNOSIS — R1319 Other dysphagia: Secondary | ICD-10-CM

## 2021-03-02 DIAGNOSIS — I483 Typical atrial flutter: Secondary | ICD-10-CM | POA: Diagnosis not present

## 2021-03-02 DIAGNOSIS — E785 Hyperlipidemia, unspecified: Secondary | ICD-10-CM | POA: Diagnosis present

## 2021-03-02 DIAGNOSIS — R5381 Other malaise: Secondary | ICD-10-CM | POA: Diagnosis not present

## 2021-03-02 DIAGNOSIS — R471 Dysarthria and anarthria: Secondary | ICD-10-CM | POA: Diagnosis present

## 2021-03-02 DIAGNOSIS — I129 Hypertensive chronic kidney disease with stage 1 through stage 4 chronic kidney disease, or unspecified chronic kidney disease: Secondary | ICD-10-CM | POA: Diagnosis present

## 2021-03-02 DIAGNOSIS — G253 Myoclonus: Secondary | ICD-10-CM | POA: Diagnosis not present

## 2021-03-02 DIAGNOSIS — L8990 Pressure ulcer of unspecified site, unspecified stage: Secondary | ICD-10-CM | POA: Insufficient documentation

## 2021-03-02 DIAGNOSIS — I4892 Unspecified atrial flutter: Secondary | ICD-10-CM | POA: Diagnosis present

## 2021-03-02 DIAGNOSIS — Z806 Family history of leukemia: Secondary | ICD-10-CM

## 2021-03-02 DIAGNOSIS — U071 COVID-19: Secondary | ICD-10-CM | POA: Diagnosis not present

## 2021-03-02 DIAGNOSIS — Z7401 Bed confinement status: Secondary | ICD-10-CM

## 2021-03-02 DIAGNOSIS — D649 Anemia, unspecified: Secondary | ICD-10-CM | POA: Diagnosis not present

## 2021-03-02 DIAGNOSIS — R1312 Dysphagia, oropharyngeal phase: Secondary | ICD-10-CM | POA: Diagnosis present

## 2021-03-02 DIAGNOSIS — Z6835 Body mass index (BMI) 35.0-35.9, adult: Secondary | ICD-10-CM | POA: Diagnosis not present

## 2021-03-02 DIAGNOSIS — N179 Acute kidney failure, unspecified: Secondary | ICD-10-CM | POA: Diagnosis present

## 2021-03-02 DIAGNOSIS — Z789 Other specified health status: Secondary | ICD-10-CM | POA: Diagnosis not present

## 2021-03-02 DIAGNOSIS — Z515 Encounter for palliative care: Secondary | ICD-10-CM | POA: Diagnosis not present

## 2021-03-02 DIAGNOSIS — L89896 Pressure-induced deep tissue damage of other site: Secondary | ICD-10-CM | POA: Diagnosis present

## 2021-03-02 DIAGNOSIS — Z880 Allergy status to penicillin: Secondary | ICD-10-CM | POA: Diagnosis not present

## 2021-03-02 DIAGNOSIS — D696 Thrombocytopenia, unspecified: Secondary | ICD-10-CM | POA: Diagnosis not present

## 2021-03-02 DIAGNOSIS — I4821 Permanent atrial fibrillation: Secondary | ICD-10-CM | POA: Diagnosis present

## 2021-03-02 DIAGNOSIS — L89159 Pressure ulcer of sacral region, unspecified stage: Secondary | ICD-10-CM | POA: Diagnosis present

## 2021-03-02 DIAGNOSIS — H9190 Unspecified hearing loss, unspecified ear: Secondary | ICD-10-CM | POA: Diagnosis present

## 2021-03-02 DIAGNOSIS — R7989 Other specified abnormal findings of blood chemistry: Secondary | ICD-10-CM | POA: Diagnosis not present

## 2021-03-02 DIAGNOSIS — D6959 Other secondary thrombocytopenia: Secondary | ICD-10-CM | POA: Diagnosis present

## 2021-03-02 DIAGNOSIS — I4811 Longstanding persistent atrial fibrillation: Secondary | ICD-10-CM | POA: Diagnosis not present

## 2021-03-02 DIAGNOSIS — R1313 Dysphagia, pharyngeal phase: Secondary | ICD-10-CM | POA: Diagnosis present

## 2021-03-02 DIAGNOSIS — D500 Iron deficiency anemia secondary to blood loss (chronic): Secondary | ICD-10-CM | POA: Diagnosis present

## 2021-03-02 DIAGNOSIS — Z6822 Body mass index (BMI) 22.0-22.9, adult: Secondary | ICD-10-CM

## 2021-03-02 DIAGNOSIS — R55 Syncope and collapse: Secondary | ICD-10-CM | POA: Diagnosis not present

## 2021-03-02 LAB — COMPREHENSIVE METABOLIC PANEL
ALT: 24 U/L (ref 0–44)
AST: 27 U/L (ref 15–41)
Albumin: 2.6 g/dL — ABNORMAL LOW (ref 3.5–5.0)
Alkaline Phosphatase: 45 U/L (ref 38–126)
Anion gap: 13 (ref 5–15)
BUN: 124 mg/dL — ABNORMAL HIGH (ref 8–23)
CO2: 26 mmol/L (ref 22–32)
Calcium: 8.7 mg/dL — ABNORMAL LOW (ref 8.9–10.3)
Chloride: 101 mmol/L (ref 98–111)
Creatinine, Ser: 2.07 mg/dL — ABNORMAL HIGH (ref 0.61–1.24)
GFR, Estimated: 28 mL/min — ABNORMAL LOW (ref >60)
Glucose, Bld: 139 mg/dL — ABNORMAL HIGH (ref 70–99)
Potassium: 4.9 mmol/L (ref 3.5–5.1)
Sodium: 140 mmol/L (ref 135–145)
Total Bilirubin: 0.5 mg/dL (ref 0.3–1.2)
Total Protein: 5.5 g/dL — ABNORMAL LOW (ref 6.5–8.1)

## 2021-03-02 LAB — RESP PANEL BY RT-PCR (FLU A&B, COVID) ARPGX2
Influenza A by PCR: NEGATIVE
Influenza B by PCR: NEGATIVE
SARS Coronavirus 2 by RT PCR: NEGATIVE

## 2021-03-02 LAB — IRON AND TIBC
Iron: 87 ug/dL (ref 45–182)
Saturation Ratios: 39 % (ref 17.9–39.5)
TIBC: 221 ug/dL — ABNORMAL LOW (ref 250–450)
UIBC: 134 ug/dL

## 2021-03-02 LAB — RETICULOCYTES
Immature Retic Fract: 28.8 % — ABNORMAL HIGH (ref 2.3–15.9)
RBC.: 2.01 MIL/uL — ABNORMAL LOW (ref 4.22–5.81)
Retic Count, Absolute: 47.4 10*3/uL (ref 19.0–186.0)
Retic Ct Pct: 2.4 % (ref 0.4–3.1)

## 2021-03-02 LAB — APTT: aPTT: 31 seconds (ref 24–36)

## 2021-03-02 LAB — URINALYSIS, ROUTINE W REFLEX MICROSCOPIC
Bilirubin Urine: NEGATIVE
Glucose, UA: NEGATIVE mg/dL
Hgb urine dipstick: NEGATIVE
Ketones, ur: NEGATIVE mg/dL
Leukocytes,Ua: NEGATIVE
Nitrite: NEGATIVE
Protein, ur: NEGATIVE mg/dL
Specific Gravity, Urine: <1.005 — ABNORMAL LOW (ref 1.005–1.030)
pH: 6 (ref 5.0–8.0)

## 2021-03-02 LAB — CBC WITH DIFFERENTIAL/PLATELET
Abs Immature Granulocytes: 0.1 10*3/uL — ABNORMAL HIGH (ref 0.00–0.07)
Basophils Absolute: 0.1 10*3/uL (ref 0.0–0.1)
Basophils Relative: 1 %
Eosinophils Absolute: 0.1 10*3/uL (ref 0.0–0.5)
Eosinophils Relative: 1 %
HCT: 19 % — ABNORMAL LOW (ref 39.0–52.0)
Hemoglobin: 5.9 g/dL — CL (ref 13.0–17.0)
Immature Granulocytes: 1 %
Lymphocytes Relative: 16 %
Lymphs Abs: 2 10*3/uL (ref 0.7–4.0)
MCH: 31.6 pg (ref 26.0–34.0)
MCHC: 31.1 g/dL (ref 30.0–36.0)
MCV: 101.6 fL — ABNORMAL HIGH (ref 80.0–100.0)
Monocytes Absolute: 1.1 10*3/uL — ABNORMAL HIGH (ref 0.1–1.0)
Monocytes Relative: 9 %
Neutro Abs: 8.9 10*3/uL — ABNORMAL HIGH (ref 1.7–7.7)
Neutrophils Relative %: 72 %
Platelets: 207 10*3/uL (ref 150–400)
RBC: 1.87 MIL/uL — ABNORMAL LOW (ref 4.22–5.81)
RDW: 15.3 % (ref 11.5–15.5)
WBC: 12.3 10*3/uL — ABNORMAL HIGH (ref 4.0–10.5)
nRBC: 0 % (ref 0.0–0.2)

## 2021-03-02 LAB — CBG MONITORING, ED: Glucose-Capillary: 130 mg/dL — ABNORMAL HIGH (ref 70–99)

## 2021-03-02 LAB — FOLATE: Folate: 22.2 ng/mL (ref >5.9)

## 2021-03-02 LAB — TSH: TSH: 3.233 u[IU]/mL (ref 0.350–4.500)

## 2021-03-02 LAB — SODIUM, URINE, RANDOM: Sodium, Ur: 22 mmol/L

## 2021-03-02 LAB — PROTIME-INR
INR: 1.5 — ABNORMAL HIGH (ref 0.8–1.2)
Prothrombin Time: 18.4 seconds — ABNORMAL HIGH (ref 11.4–15.2)

## 2021-03-02 LAB — HEMOGLOBIN AND HEMATOCRIT, BLOOD
HCT: 20.3 % — ABNORMAL LOW (ref 39.0–52.0)
Hemoglobin: 6.9 g/dL — CL (ref 13.0–17.0)

## 2021-03-02 LAB — FERRITIN: Ferritin: 584 ng/mL — ABNORMAL HIGH (ref 24–336)

## 2021-03-02 LAB — BRAIN NATRIURETIC PEPTIDE: B Natriuretic Peptide: 267.3 pg/mL — ABNORMAL HIGH (ref 0.0–100.0)

## 2021-03-02 LAB — POC OCCULT BLOOD, ED: Fecal Occult Bld: POSITIVE — AB

## 2021-03-02 LAB — VITAMIN B12: Vitamin B-12: 1247 pg/mL — ABNORMAL HIGH (ref 180–914)

## 2021-03-02 LAB — PREPARE RBC (CROSSMATCH)

## 2021-03-02 LAB — ABO/RH: ABO/RH(D): A POS

## 2021-03-02 MED ORDER — SODIUM CHLORIDE 0.9 % IV SOLN: INTRAVENOUS | Status: DC

## 2021-03-02 MED ORDER — PANTOPRAZOLE SODIUM 40 MG IV SOLR
40.0000 mg | Freq: Two times a day (BID) | INTRAVENOUS | Status: DC
  Administered 2021-03-02 – 2021-03-07 (×12): 40 mg via INTRAVENOUS
  Filled 2021-03-02 (×12): qty 40

## 2021-03-02 MED ORDER — SODIUM CHLORIDE 0.9% IV SOLUTION: Freq: Once | INTRAVENOUS | Status: DC

## 2021-03-02 MED ORDER — SODIUM CHLORIDE 0.9 % IV BOLUS
2000.0000 mL | Freq: Once | INTRAVENOUS | Status: AC
  Administered 2021-03-02: 2000 mL via INTRAVENOUS

## 2021-03-02 MED ORDER — SODIUM CHLORIDE 0.9 % IV SOLN: 10.0000 mL/h | Freq: Once | INTRAVENOUS | Status: DC

## 2021-03-02 MED ORDER — LEVOTHYROXINE SODIUM 75 MCG PO TABS
75.0000 ug | ORAL_TABLET | Freq: Every day | ORAL | Status: DC
  Administered 2021-03-04 – 2021-03-10 (×7): 75 ug
  Filled 2021-03-02 (×7): qty 1

## 2021-03-02 MED ORDER — ATORVASTATIN CALCIUM 40 MG PO TABS: 40.0000 mg | ORAL_TABLET | Freq: Every day | ORAL | Status: DC

## 2021-03-02 MED ORDER — ATORVASTATIN CALCIUM 40 MG PO TABS
40.0000 mg | ORAL_TABLET | Freq: Every day | ORAL | Status: DC
  Administered 2021-03-02 – 2021-03-09 (×8): 40 mg
  Filled 2021-03-02 (×8): qty 1

## 2021-03-02 NOTE — ED Provider Notes (Signed)
[facility] EMERGENCY DEPARTMENT Provider Note   CSN: [id number] Arrival date & time: 03/02/21  1026     History  Chief Complaint  Patient presents with   Dizziness    Gregory [name] is a 88 y.o. male.  HPI Patient arrives via EMS due to concern of an episode of near syncope, lightheadedness.  History obtained by the dividual's and the patient, as well as chart review.  Seemingly the patient went to [facility] in [place] 1 week ago due to esophageal issues.  At that point he had a feeding tube placed.  He is known to be on anticoagulation due to history of a flutter.  He has a pacemaker in place.  No report of notable recovery until today, when the patient had episode of lightheadedness while on the commode.  There is some report of malodorous stool, not necessarily black, no report of bright red blood per rectum.    Home Medications Prior to Admission medications   Medication Sig Start Date End Date Taking? Authorizing Provider  atorvastatin (LIPITOR) 40 MG tablet Take 40 mg by mouth at bedtime.   Yes [provider]  CALCIUM PO Take 1 tablet by mouth daily.   Yes [provider]  Cholecalciferol (VITAMIN D3 PO) Take 1 tablet by mouth daily.   Yes [provider]  clotrimazole (MYCELEX) 10 MG troche Take 10 mg by mouth 5 (five) times daily. For 7 days 02/28/21 03/07/21 Yes [provider]  ELIQUIS 2.5 MG TABS tablet TAKE 1 TABLET BY MOUTH TWICE A DAY Patient taking differently: Place 2.5 mg into feeding tube 2 (two) times daily. 09/15/20  Yes Martinique, Peter M, MD  folic acid (FOLVITE) 1 MG tablet Take 1 mg by mouth daily.   Yes [provider]  levothyroxine (SYNTHROID) 75 MCG tablet Take 75 mcg by mouth daily. 01/26/19  Yes [provider]  Multiple Vitamin (MULTIVITAMIN) tablet Take 1 tablet by mouth daily.   Yes [provider]  NIACIN PO Take 1 tablet by mouth daily.   Yes [provider]   Nutritional Supplements (NUTREN 1.5 EN) 250 mLs by Enteral route in the morning, at noon, in the evening, and at bedtime.   Yes [provider]  sucralfate (CARAFATE) 1 GM/10ML suspension Place 1 g into feeding tube every 6 (six) hours.   Yes [provider]  vitamin B-12 (CYANOCOBALAMIN) 1000 MCG tablet Take 1,000 mcg by mouth daily.    Yes [provider]  vitamin C (ASCORBIC ACID) 500 MG tablet Take 500 mg by mouth daily.   Yes [provider]      Allergies    Amlodipine besylate and Penicillins    Review of Systems   Review of Systems  Constitutional:        Per HPI, otherwise negative  HENT:         Per HPI, otherwise negative  Respiratory:         Per HPI, otherwise negative  Cardiovascular:        Per HPI, otherwise negative  Gastrointestinal:  Negative for vomiting.  Endocrine:       Negative aside from HPI  Genitourinary:        Neg aside from HPI   Musculoskeletal:        Per HPI, otherwise negative  Skin: Negative.   Neurological:  Positive for weakness and light-headedness. Negative for syncope.  Hematological:  Bruises/bleeds easily.   Physical Exam Updated Vital Signs BP Marland Kitchen)  109/43    Pulse 72    Temp (!) 97.4 F (36.3 C) (Axillary)    Resp (!) 22    Ht 5\' 8"  (1.727 m)    Wt 68 kg    SpO2 100%    BMI 22.79 kg/m  Physical Exam Vitals and nursing note reviewed.  Constitutional:      Appearance: He is well-developed. He is ill-appearing.  HENT:     Head: Normocephalic and atraumatic.  Eyes:     Conjunctiva/sclera: Conjunctivae normal.  Cardiovascular:     Rate and Rhythm: Normal rate and regular rhythm.  Pulmonary:     Effort: Pulmonary effort is normal. No respiratory distress.     Breath sounds: No stridor.  Chest:    Abdominal:     General: There is no distension.    Genitourinary:    Rectum: Guaiac result positive.  Skin:    General: Skin is warm and dry.  Neurological:     Mental Status: He is alert and  oriented to person, place, and time.    ED Results / Procedures / Treatments   Labs (all labs ordered are listed, but only abnormal results are displayed) Labs Reviewed  COMPREHENSIVE METABOLIC PANEL - Abnormal; Notable for the following components:      Result Value   Glucose, Bld 139 (*)    BUN 124 (*)    Creatinine, Ser 2.07 (*)    Calcium 8.7 (*)    Total Protein 5.5 (*)    Albumin 2.6 (*)    GFR, Estimated 28 (*)    All other components within normal limits  CBC WITH DIFFERENTIAL/PLATELET - Abnormal; Notable for the following components:   WBC 12.3 (*)    RBC 1.87 (*)    Hemoglobin 5.9 (*)    HCT 19.0 (*)    MCV 101.6 (*)    Neutro Abs 8.9 (*)    Monocytes Absolute 1.1 (*)    Abs Immature Granulocytes 0.10 (*)    All other components within normal limits  PROTIME-INR - Abnormal; Notable for the following components:   Prothrombin Time 18.4 (*)    INR 1.5 (*)    All other components within normal limits  BRAIN NATRIURETIC PEPTIDE - Abnormal; Notable for the following components:   B Natriuretic Peptide 267.3 (*)    All other components within normal limits  CBG MONITORING, ED - Abnormal; Notable for the following components:   Glucose-Capillary 130 (*)    All other components within normal limits  POC OCCULT BLOOD, ED - Abnormal; Notable for the following components:   Fecal Occult Bld POSITIVE (*)    All other components within normal limits  RESP PANEL BY RT-PCR (FLU A&B, COVID) ARPGX2  APTT  TYPE AND SCREEN  ABO/RH  PREPARE RBC (CROSSMATCH)    EKG EKG Interpretation  Date/Time:  Thursday March 02 2021 10:34:42 EST Ventricular Rate:  70 PR Interval:    QRS Duration: 143 QT Interval:  425 QTC Calculation: 459 R Axis:   -79 Text Interpretation: VENTRICULAR PACED RHYTHM Abnormal ECG Confirmed by Carmin Muskrat [phone]) on 03/02/2021 10:51:05 AM  Radiology CT Head Wo Contrast  Result Date: 03/02/2021 CLINICAL DATA:  Dizziness. EXAM: CT HEAD WITHOUT  CONTRAST TECHNIQUE: Contiguous axial images were obtained from the base of the skull through the vertex without intravenous contrast. RADIATION DOSE REDUCTION: This exam was performed according to the departmental dose-optimization program which includes automated exposure control, adjustment of the mA and/or kV according to patient size  and/or use of iterative reconstruction technique. COMPARISON:  None. FINDINGS: Brain: No evidence of acute infarction, hemorrhage, hydrocephalus, extra-axial collection or mass lesion/mass effect. Old right cerebellar infarct. Moderate generalized cerebral atrophy, within normal limits for age. Scattered moderate periventricular and subcortical white matter hypodensities are nonspecific, but favored to reflect chronic microvascular ischemic changes. Vascular: Calcified atherosclerosis at the skull base. No hyperdense vessel. Skull: Normal. Negative for fracture or focal lesion. Sinuses/Orbits: No acute finding. Other: None. IMPRESSION: 1. No acute intracranial abnormality.  Old right cerebellar infarct. 2. Moderate atrophy and chronic microvascular ischemic changes. Electronically Signed   By: Titus Dubin M.D.   On: 03/02/2021 12:27   DG Chest Port 1 View  Result Date: 03/02/2021 CLINICAL DATA:  Dizziness. EXAM: PORTABLE CHEST 1 VIEW COMPARISON:  07/25/2020 FINDINGS: No focal consolidation. No pleural effusion or pneumothorax. Heart and mediastinal contours are unremarkable. Cardiac pacemaker in satisfactory position. Prior TAVR. No acute osseous abnormality. IMPRESSION: No active disease. Electronically Signed   By: Kathreen Devoid M.D.   On: 03/02/2021 10:46    Procedures Procedures    Medications Ordered in ED Medications  0.9 %  sodium chloride infusion ( Intravenous New Bag/Given 03/02/21 1110)  0.9 %  sodium chloride infusion (has no administration in time range)  sodium chloride 0.9 % bolus 2,000 mL (2,000 mLs Intravenous New Bag/Given 03/02/21 1430)    ED  Course/ Medical Decision Making/ A&P 88 year old male on anticoagulation presents after episode of lightheadedness with malodorous stool.  On exam the patient is awake, but ill in appearance.  Differential includes GI bleed, arrhythmia, ACS, symptomatic anemia, infection considered.  Patient placed on continuous cardiac monitor, pulse oximetry, chart review performed.  Chart review most notable for cardiology clinic visit from 2 months ago with pacemaker check, history of TAVR performed 2 years ago, ongoing anticoagulant use.  2:52 PM Now receiving blood, fluids, patient's blood pressure has improved.  I discussed this case with our GI team and they have seen him, we discussed his case at bedside.  Of also discussed his case with our internal medicine team for admission.  After discussing the results with the patient's son and daughter, power of attorney, I attempted to do transfer to [facility], per request, but there is no space available at that facility, they are not excepting patients for a waiting list currently.                           Medical Decision Making Adult male with multiple medical problems including [place] PEG tube following unsuccessful improvement of his dysphagia now presents with concern for an episode of near syncope in the context of bowel movement with black stool.  Patient is on Eliquis for history of A. fib, has a pacemaker.  Case discussed with remote monitoring service for his pacemaker interpretation, which was reassuring, no events since January 4 of this year.  Case discussed with our GI team, at bedside.  Case discussed with internal medicine service for admission.  Patient's findings concerning for symptomatic anemia secondary to his hemoglobin 5.9, critically abnormal, down from baseline of 9.  Patient was found to have worsening renal function, though there is a gap in our records as the patient gets his care  elsewhere in general. Patient hypotensive initially, with resuscitation map improved to greater than 60, but given his critical illness he will require admission to the stepdown unit.  Amount and/or  Complexity of Data Reviewed Independent Historian: caregiver and EMS External Data Reviewed: notes.    Details: Pacemaker interrogation at cardiology clinic last month, unremarkable Labs: ordered. Decision-making details documented in ED Course. Radiology: ordered. Decision-making details documented in ED Course.    Details: Head CT interpreted by me, unremarkable, chest x-ray without obvious pneumonia ECG/medicine tests: ordered. Decision-making details documented in ED Course.  Risk Prescription drug management. Decision regarding hospitalization.  Critical Care Total time providing critical care: 30-74 minutes (45)       Final Clinical Impression(s) / ED Diagnoses Final diagnoses:  Gastrointestinal hemorrhage with melena  Symptomatic anemia     Carmin Muskrat, MD 03/02/21 1457

## 2021-03-02 NOTE — ED Notes (Signed)
Patient transported to CT 

## 2021-03-02 NOTE — Hospital Course (Addendum)
Gregory [name] is a 88 y.o. male with a PMHx of aortic stenosis, paroxysmal atrial fibrillation on Eliquis, atrioventricular complete heart block status post permanent pacemaker implantation, HLD, hypothyroidism, iron deficiency, and prior COVID infection in June 2022 who presents to the ED today with a CC of dizziness and near syncope, found to have symptomatic anemia likely secondary to GI bleed.   Symptomatic anemia Hemorrhagic shock Background, discussion with daughter and chart review: Pt has a history of oropharyngeal dysphagia since July 2022. He was previously evaluated for this at [facility] and is s/p outpatient speech therapy with some improvement. Presented to GI outpatient at [facility] in [facility] for a second opinion. He failed barium swallow there and was sent to the hospital as a direct admit for malnutrition secondary to dysphagia. GI was consulted and pt received a Corpak. Afterwards, patient's hemoglobin was less than 7 on two separate occasions, received blood transfusions and started on sucralfate and protonix (endorsed melena). On 1/10, got a PEG tube placed.   The patient presented here after an episode of dizziness while lying in bed.  He denied dark, black, or bloody stools although notes that he had not had a bowel movement in the past 2 weeks since discharge from prior hospitalization at [facility].  Initial blood pressure 90/30 with MAP of 53 on admission. Hgb of 5.9. The patient has received fluid boluses and packed red blood cells with improvement of MAP and hemoglobin.   Suspected GI bleed with positive FOBT. Specifically, there was a concern for upper GI bleed after EGD and PEG placement during his last hospitalization at [facility].  GI was consulted here.  They recommended supportive care rather than EGD.  The following day, the patient's condition was discussed at length as well as the risks and benefits of EGD versus watchful waiting.  The family chose watchful waiting and also decided to  hold Eliquis for the time being.  Otherwise, we will continue Protonix and monitor his hemoglobin throughout his hospitalization.  The patient was given blood products as needed to maintain hemoglobin above 7.  Initially, the patient wished to pursue home health and monitor patient's hemoglobin as outpatient with his PCP at [facility].  However, patient later expressed desire to pursue rehab in a facility, SNF versus CIR.  Therefore, PT/OT was consulted for their recommendations. ***   Dysphagia s/p PEG Pt has a history of oropharyngeal dysphagia since July 2022. Admitted to [facility] in [facility] for malnutrition secondary to dysphagia. GI was consulted and pt received a Corpak. On 1/10, got a PEG tube placed.  We followed already recommendations throughout his hospitalization, and patient continued feeding as well as medications through his PEG tube.  On 1/25, IR was consulted due to T-fasteners with the PEG tube***.  History of a flutter S/p TAVR in 2020 Patient had a prosthetic valve placed in 2020 at [facility].  Also status post pacemaker due to complete heart block.  He was on Eliquis at home for this, however this was held in the setting of GI bleed.  Patient and his family decided to hold Eliquis with plan to restart in the next few weeks.  AKI on CKD, resolved His creatinine from his last hospitalization in December was around 1.6. Creatinine on admission was 2.07.  Improved to 1.65 this morning after ministration of blood products and fluid boluses. Renal US negative. UA unremarkable. Suspect prerenal etiology.  His BMP was trended throughout his hospitalization.   Hypothyroidism TSH was 3.2.  Patient is on Synthroid at home.  This was continued throughout his hospitalization.   Hyperlipidemia Home atorvastatin was continued throughout his hospitalization.

## 2021-03-02 NOTE — Progress Notes (Signed)
Pt received from ED. VSS. CHG complete. Telemetry applied. Patient and family oriented to room and unit.Call light in reach.  Clyde Canterbury, RN

## 2021-03-02 NOTE — H&P (Addendum)
Date: 03/02/2021               Patient Name:  Gregory [name] MRN: [id number]  DOB: [birth year] Age / Sex: 88 y.o., male   PCP: Orpah Greek, DO         Medical Service: Internal Medicine Teaching Service         Attending Physician: Dr. Charise Killian, MD    First Contact: Dr. Lorin Glass Pager: [phone]  Second Contact: Dr. Alfonse Spruce Pager: [phone]       After Hours (After 5p/  First Contact Pager: [phone]  weekends / holidays): Second Contact Pager: [phone]   Chief Complaint: dizziness, "almost passing out"  History of Present Illness: Gregory [name] is a 88 y.o. male with a PMHx of aortic stenosis, paroxysmal atrial fibrillation on Eliquis, atrioventricular complete heart block status post permanent pacemaker implantation, HLD, hypothyroidism, iron deficiency, and prior COVID infection in June 2022 who presents to the ED today with a CC of dizziness and near syncope.  The patient states that, this morning, he was lying down in bed when he started "feeling funny" and then had a feeling of almost passing out.  He endorses feeling dizzy with vertigo, which went away after couple of minutes.  This is never happened to him before.  He is unsure whether his stools are darker or bloody.  Unsure about color of urine but he denies dysuria today.  His last bowel movements were today (had 2 of them), however these were the first bowel movements he has had in about 2 weeks, states that he had to strain.    He also discussed his recent hospitalization at the [facility] in [place].  He had a PEG placed on 12/30.  His son and daughter have been helping him with feeds, and he has not had any p.o. intake otherwise.  He is unsure when his last colonoscopy was and whether or not this was abnormal.  Also endorses poor appetite that started when he had COVID back in June.  He states that his appetite has never fully recovered and mostly drinks cold water which he is still able to taste. Otherwise, other  symptoms include generalized weakness and nausea.  Denies headache, LE swelling, chest pain, shortness of breath, abdominal pain, heartburn, reflux. There are no other complaints or concerns at this time.  Background, discussion with daughter and chart review: Pt has a history of oropharyngeal dysphagia since July 2022. He was previously evaluated for this at [facility] and is s/p outpatient speech therapy with some improvement. Presented to GI outpatient at [facility] in [facility] for a second opinion. He failed barium swallow there and was sent to the hospital as a direct admit for malnutrition secondary to dysphagia. GI was consulted and pt received a Corpak. Afterwards, patient's hemoglobin was less than 7 on two separate occasions, received blood transfusions and started on sucralfate and protonix (endorsed melena). On 1/10, got a PEG tube placed.   In the ED, blood pressure was 90/37 with map of 53.  Heart rate 70.  Satting well on room air.  He has leukocytosis of 12.3 with hemoglobin of 5.9.  BMP showed creatinine of 2.07 and BUN of 124.  Albumin 2.6.  FOBT was positive.  GI was consulted.  2 units of blood was ordered.  Meds:  Current Meds  Medication Sig   atorvastatin (LIPITOR) 40 MG tablet Take 40 mg by mouth at bedtime.   CALCIUM PO Take 1 tablet by  mouth daily.   Cholecalciferol (VITAMIN D3 PO) Take 1 tablet by mouth daily.   clotrimazole (MYCELEX) 10 MG troche Take 10 mg by mouth 5 (five) times daily. For 7 days   ELIQUIS 2.5 MG TABS tablet TAKE 1 TABLET BY MOUTH TWICE A DAY (Patient taking differently: Place 2.5 mg into feeding tube 2 (two) times daily.)   folic acid (FOLVITE) 1 MG tablet Take 1 mg by mouth daily.   levothyroxine (SYNTHROID) 75 MCG tablet Take 75 mcg by mouth daily.   Multiple Vitamin (MULTIVITAMIN) tablet Take 1 tablet by mouth daily.   NIACIN PO Take 1 tablet by mouth daily.   Nutritional Supplements (NUTREN 1.5 EN) 250 mLs by Enteral route in the morning, at noon, in the  evening, and at bedtime.   sucralfate (CARAFATE) 1 GM/10ML suspension Place 1 g into feeding tube every 6 (six) hours.   vitamin B-12 (CYANOCOBALAMIN) 1000 MCG tablet Take 1,000 mcg by mouth daily.    vitamin C (ASCORBIC ACID) 500 MG tablet Take 500 mg by mouth daily.     Allergies: Allergies as of 03/02/2021 - Review Complete 03/02/2021  Allergen Reaction Noted   Amlodipine besylate  08/26/2019   Penicillins Hives and Rash 05/02/2013   Past Medical History:  Diagnosis Date   Atrial flutter [facility])    s/p ablation   Cancer ([place])    skin cancer   Cataracts, bilateral    Hypertension    Neuromuscular disorder ([place])    Pacemaker 2010   Thyroid disease     Family History:  Family History  Problem Relation Age of Onset   Heart disease Mother    Cancer Father        leukemia     Social History:  -Lives at home with son. Daughter also helps.  -Use walker and cane for ambulation.  -Has an aide -Son helps with PEG tubs -No smoking or alcohol  Review of Systems: A complete ROS was negative except as per HPI.   Physical Exam: Blood pressure (!) 110/48, pulse 70, temperature 97.6 F (36.4 C), temperature source Oral, resp. rate (!) 27, height 5\' 8"  (1.727 m), weight 68 kg, SpO2 100 %. Physical Exam Constitutional:      General: He is not in acute distress.    Appearance: He is ill-appearing.  HENT:     Head: Normocephalic and atraumatic.     Mouth/Throat:     Mouth: Mucous membranes are dry.  Eyes:     Extraocular Movements: Extraocular movements intact.     Pupils: Pupils are equal, round, and reactive to light.  Cardiovascular:     Rate and Rhythm: Normal rate and regular rhythm.     Heart sounds: No murmur heard.   No friction rub. No gallop.  Pulmonary:     Effort: Pulmonary effort is normal.     Breath sounds: Normal breath sounds. No wheezing, rhonchi or rales.  Abdominal:     General: Abdomen is flat. Bowel sounds are normal.     Palpations: Abdomen is  soft.     Tenderness: There is no abdominal tenderness.     Comments: PEG tube in place, no signs of bleeding or drainage from the site  Musculoskeletal:        General: No swelling, deformity or signs of injury.  Skin:    General: Skin is warm and dry.     Comments: Decreased skin turgor  Neurological:     General: No focal deficit present.  Mental Status: He is alert and oriented to person, place, and time.     Cranial Nerves: No cranial nerve deficit.  Psychiatric:        Mood and Affect: Mood normal.        Behavior: Behavior normal.     EKG: personally reviewed my interpretation is appropriate repolarization in leads II, III, and aVF have been present on prior EKGs.  CXR: personally reviewed my interpretation is negative for cardiopulmonary processes  Assessment & Plan by Problem: Principal Problem:   Symptomatic anemia Active Problems:   CAD (coronary artery disease)   Atrial flutter (HCC)   Dysphagia   Protein malnutrition (HCC)  Gregory [name] is a 88 y.o. male with a PMHx of aortic stenosis, paroxysmal atrial fibrillation on Eliquis, atrioventricular complete heart block status post permanent pacemaker implantation, HLD, hypothyroidism, iron deficiency, and prior COVID infection in June 2022 who presents to the ED today with a CC of dizziness and near syncope, found to have symptomatic anemia likely secondary to GI bleed.  Symptomatic anemia Hemorrhagic shock Initial blood pressure 90/30 with MAP of 53 on admission, proved after 2L fluid bolus resuscitation and blood transfusion.  Suspect GI bleed with positive FOBT.  Patient is unsure of his stool color.  Denies symptoms of GERD.   There was a concern for upper GI bleed after EGD and PEG placement in December.  I do not think this is an acute blood loss anemia with the lack of diarrhea or vomiting.  This could be occult GI bleed.  He is also on Eliquis for A. fib/a flutter but unsure last dose taken. -Follow H&H  posttransfusion -CBC in a.m. -Pending reticulocyte, iron, TIBC, B12 and folate -Transfusion goal of greater than 7  Dysphagia s/p PEG Pt has a history of oropharyngeal dysphagia since July 2022. Admitted to [facility] in [facility] for malnutrition secondary to dysphagia. GI was consulted and pt received a Corpak. On 1/10, got a PEG tube placed.  -Continue feeding through PEG tube here -RD consult  AKI on CKD His creatinine from his last hospitalization in December was around 1.6.  Creatinine on admission was 2.07.  Suspect prerenal or ATN from either dehydration and hemorrhagic shock. -Pending UA and urine sodium -BMP in a.m. -Pending renal ultrasound  Hypothyroidism -Resume Synthroid 75 mcg -Check TSH  History of a flutter History of complete heart block status post pacemaker -Currently rate controlled -Hold Eliquis  Hyperlipidemia -Resume atorvastatin. I do not know if he is taking this for primary or secondary prevention.  Full code Diet n.p.o. IVF: Normal saline 125 cc/hour DVT: SCDs  Dispo: Admit patient to Inpatient with expected length of stay greater than 2 midnights.  Signed: Orvis Brill, MD 03/02/2021, 4:14 PM  Pager: [phone]  After 5pm on weekdays and 1pm on weekends: On Call pager: [phone]

## 2021-03-02 NOTE — ED Notes (Signed)
Help straighten patient up in the bed patient is resting with call bell in reach

## 2021-03-02 NOTE — ED Notes (Signed)
Help get patient on the monitor patient is resting with family at bedside

## 2021-03-02 NOTE — ED Triage Notes (Signed)
Pt arrived via [facility] from home. Family sat pt on side of bed and pt became dizzy and his arms started shaking twice when they tried to sit him up. They laid the pt back down and the shaking stopped. Per EMS, pt had 3 large soft Bms today. Per EMS, they saw one of the Bms and it was very dark, almost black and soft.  VSS.

## 2021-03-02 NOTE — Consult Note (Addendum)
Consultation  Referring Provider:     Dr. Carmin Muskrat Primary Care Physician:  Orpah Greek, DO Primary Gastroenterologist:   [facility]  Dr. Earnie Larsson    Reason for Consultation:    anemia, FOBT +, setting of Eliquis    Impression    Melena In the setting of Eliquis with hypotension and tachypnea HGB 5.9 MCV 101.6 Platelets 207 BUN 124 (40 02/21/21)  Cr 2.07 Gross elevation of BUN, melena reported, on Eliquis Recent G tube placement 12/30 with [name] in [place] with dyspagia, no EGD seen Possible diff includes bleeding from Gtube, AVM's, ulcers, gastritis.   Acute on chronic anemia hemoglobin 5.9, 8.4 02/22/2021 compared to 8.9 02/01/2020   Atrial flutter, TAVR 2020, status post pacemaker On Eliquis last dose unknown  Protein calorie malnutrition    Plan   - with hypotension, tachypnea, will do supportive care at this time.  --Continue to monitor H&H with transfusion as needed to maintain hemoglobin greater than 8 given cardiac history. -Protonix 40 mg IV BID. -Keep NPO -IF patient is stable and has had 2 day wash out from eliquis can consider endoscopic evaluation.  Patient is high risk due to age and medical comorbidities. -Will reevaluate in the morning. - further recommendations per Dr. Silverio Decamp         HPI:   Gregory [name] is a 88 y.o. male with significant medical history for hypertension, hypothyroidism, neuromuscular disorder, atrial flutters status post ablation still on Eliquis, status post pacemaker, status post TAVR 2020, history of lung nodule right lower lobe Significant surgeries include appendectomy, right hernia repair, and left inguinal hernia repair 2015 and history of PEG tube placement on the left 02/10/22. 2020 iron 9.4  BUN 124, creatinine 2.07-at baseline BUN 01/2019 127 and creatinine 1.15 White blood cell count 12.3, hemoglobin 5.9, compared to 8.9 02/01/2020 and 10.4 in 2018. MCV 101.6, platelets normal 207 INR 1.5, call occult  positive  Patient lying in bed, states he is not feeling well. Patient had recent PEG tube placement at [facility] in [place], here with his family, they were not at bedside. States he was having horrible dizziness this morning were going to have a bowel movement, had presyncopal episode.  Reports of dark black stools.  Patient also was nauseous during this time frame denies vomiting. Patient has PEG tube and gets feedings through this, patient has baseline dysphagia with work-up at [facility]. Denies heartburn, abdominal pain, diarrhea or constipation.  Denies shortness of breath or chest pain. On Carafate with tube feedings.  Esophogram 02/10/21 Limited examination due to patient condition and difficulty completing consecutive  swallows. Significant aspiration, therefore study terminated. Consider modified barium swallow with  speech pathology for further evaluation  Past Medical History:  Diagnosis Date   Atrial flutter [facility])    s/p ablation   Cancer [facility])    skin cancer   Cataracts, bilateral    Hypertension    Neuromuscular disorder [facility])    Pacemaker 2010   Thyroid disease     Surgical History:  He  has a past surgical history that includes Appendectomy; Eye surgery (Bilateral); Hernia repair (Right); Colonoscopy; Inguinal hernia repair (Left, 10/12/2013); Insertion of mesh (Left, 10/12/2013); and PEG tube placement (Left). Family History:  His family history includes Cancer in his father; Heart disease in his mother. Social History:   reports that he has never smoked. He has never used smokeless tobacco. He reports that he does not drink alcohol and does not use  drugs.  Prior to Admission medications   Medication Sig Start Date End Date Taking? Authorizing Provider  atorvastatin (LIPITOR) 40 MG tablet Take 40 mg by mouth at bedtime.   Yes [provider]  CALCIUM PO Take 1 tablet by mouth daily.   Yes [provider]  Cholecalciferol (VITAMIN D3 PO) Take 1  tablet by mouth daily.   Yes [provider]  clotrimazole (MYCELEX) 10 MG troche Take 10 mg by mouth 5 (five) times daily. For 7 days 02/28/21 03/07/21 Yes [provider]  ELIQUIS 2.5 MG TABS tablet TAKE 1 TABLET BY MOUTH TWICE A DAY Patient taking differently: Place 2.5 mg into feeding tube 2 (two) times daily. 09/15/20  Yes Martinique, Peter M, MD  folic acid (FOLVITE) 1 MG tablet Take 1 mg by mouth daily.   Yes [provider]  levothyroxine (SYNTHROID) 75 MCG tablet Take 75 mcg by mouth daily. 01/26/19  Yes [provider]  Multiple Vitamin (MULTIVITAMIN) tablet Take 1 tablet by mouth daily.   Yes [provider]  NIACIN PO Take 1 tablet by mouth daily.   Yes [provider]  Nutritional Supplements (NUTREN 1.5 EN) 250 mLs by Enteral route in the morning, at noon, in the evening, and at bedtime.   Yes [provider]  sucralfate (CARAFATE) 1 GM/10ML suspension Place 1 g into feeding tube every 6 (six) hours.   Yes [provider]  vitamin B-12 (CYANOCOBALAMIN) 1000 MCG tablet Take 1,000 mcg by mouth daily.    Yes [provider]  vitamin C (ASCORBIC ACID) 500 MG tablet Take 500 mg by mouth daily.   Yes [provider]    Current Facility-Administered Medications  Medication Dose Route Frequency Provider Last Rate Last Admin   0.9 %  sodium chloride infusion   Intravenous Continuous Carmin Muskrat, MD 125 mL/hr at 03/02/21 1110 New Bag at 03/02/21 1110   0.9 %  sodium chloride infusion  10 mL/hr Intravenous Once Carmin Muskrat, MD       Current Outpatient Medications  Medication Sig Dispense Refill   atorvastatin (LIPITOR) 40 MG tablet Take 40 mg by mouth at bedtime.     CALCIUM PO Take 1 tablet by mouth daily.     Cholecalciferol (VITAMIN D3 PO) Take 1 tablet by mouth daily.     clotrimazole (MYCELEX) 10 MG troche Take 10 mg by mouth 5 (five) times daily. For 7 days     ELIQUIS 2.5 MG TABS tablet TAKE  1 TABLET BY MOUTH TWICE A DAY (Patient taking differently: Place 2.5 mg into feeding tube 2 (two) times daily.) 315 tablet 1   folic acid (FOLVITE) 1 MG tablet Take 1 mg by mouth daily.     levothyroxine (SYNTHROID) 75 MCG tablet Take 75 mcg by mouth daily.     Multiple Vitamin (MULTIVITAMIN) tablet Take 1 tablet by mouth daily.     NIACIN PO Take 1 tablet by mouth daily.     Nutritional Supplements (NUTREN 1.5 EN) 250 mLs by Enteral route in the morning, at noon, in the evening, and at bedtime.     sucralfate (CARAFATE) 1 GM/10ML suspension Place 1 g into feeding tube every 6 (six) hours.     vitamin B-12 (CYANOCOBALAMIN) 1000 MCG tablet Take 1,000 mcg by mouth daily.      vitamin C (ASCORBIC ACID) 500 MG tablet Take 500 mg by mouth daily.      Allergies as of 03/02/2021 - Review Complete 03/02/2021  Allergen  Reaction Noted   Amlodipine besylate  08/26/2019   Penicillins Hives and Rash 05/02/2013    Review of Systems:    Constitutional: No weight loss, fever, chills, weakness or fatigue HEENT: Eyes: No change in vision               Ears, Nose, Throat:  No change in hearing or congestion Skin: No rash or itching Cardiovascular: No chest pain, chest pressure or palpitations   Respiratory: No SOB or cough Gastrointestinal: See HPI and otherwise negative Genitourinary: No dysuria or change in urinary frequency Neurological: No headache, dizziness or syncope Musculoskeletal: No new muscle or joint pain Hematologic: No bleeding or bruising Psychiatric: No history of depression or anxiety     Physical Exam:  Vital signs in last 24 hours: Temp:  [97.4 F (36.3 C)] 97.4 F (36.3 C) (01/19 1332) Pulse Rate:  [69-76] 70 (01/19 1332) Resp:  [15-33] 25 (01/19 1332) BP: (90-103)/(36-56) 91/36 (01/19 1332) SpO2:  [100 %] 100 % (01/19 1300) Weight:  [68 kg] 68 kg (01/19 1042)    General:   Cachectic, pale HOH, chronically ill-appearing male  Head:  Normocephalic and atraumatic. Eyes:  sclerae anicteric,conjunctive pale  Heart: RRR, no murmurs rubs or gallops.Left pacemaker Pulm: Clear anteriorly; no wheezing Abdomen:  Soft, Flat AB, skin exam Gtube left AB, Normal bowel sounds.  no  tenderness Without guarding and Without rebound Extremities:  Without edema. Msk:  Symmetrical without gross deformities. Peripheral pulses intact.  Neurologic:  Alert and  oriented x4;  grossly normal neurologically. Skin:   Dry and intact without significant lesions or rashes. Psychiatric: Demonstrates good judgement and reason without abnormal affect or behaviors.  LAB RESULTS: Recent Labs    03/02/21 1100  WBC 12.3*  HGB 5.9*  HCT 19.0*  PLT 207   BMET Recent Labs    03/02/21 1100  NA 140  K 4.9  CL 101  CO2 26  GLUCOSE 139*  BUN 124*  CREATININE 2.07*  CALCIUM 8.7*   LFT Recent Labs    03/02/21 1100  PROT 5.5*  ALBUMIN 2.6*  AST 27  ALT 24  ALKPHOS 45  BILITOT 0.5   PT/INR Recent Labs    03/02/21 1100  LABPROT 18.4*  INR 1.5*    STUDIES: CT Head Wo Contrast  Result Date: 03/02/2021 CLINICAL DATA:  Dizziness. EXAM: CT HEAD WITHOUT CONTRAST TECHNIQUE: Contiguous axial images were obtained from the base of the skull through the vertex without intravenous contrast. RADIATION DOSE REDUCTION: This exam was performed according to the departmental dose-optimization program which includes automated exposure control, adjustment of the mA and/or kV according to patient size and/or use of iterative reconstruction technique. COMPARISON:  None. FINDINGS: Brain: No evidence of acute infarction, hemorrhage, hydrocephalus, extra-axial collection or mass lesion/mass effect. Old right cerebellar infarct. Moderate generalized cerebral atrophy, within normal limits for age. Scattered moderate periventricular and subcortical white matter hypodensities are nonspecific, but favored to reflect chronic microvascular ischemic changes. Vascular: Calcified atherosclerosis at the skull  base. No hyperdense vessel. Skull: Normal. Negative for fracture or focal lesion. Sinuses/Orbits: No acute finding. Other: None. IMPRESSION: 1. No acute intracranial abnormality.  Old right cerebellar infarct. 2. Moderate atrophy and chronic microvascular ischemic changes. Electronically Signed   By: Titus Dubin M.D.   On: 03/02/2021 12:27   DG Chest Port 1 View  Result Date: 03/02/2021 CLINICAL DATA:  Dizziness. EXAM: PORTABLE CHEST 1 VIEW COMPARISON:  07/25/2020 FINDINGS: No focal consolidation. No pleural effusion or pneumothorax. Heart  and mediastinal contours are unremarkable. Cardiac pacemaker in satisfactory position. Prior TAVR. No acute osseous abnormality. IMPRESSION: No active disease. Electronically Signed   By: Kathreen Devoid M.D.   On: 03/02/2021 10:46     Vladimir Crofts  03/02/2021, 1:34 PM   Attending physician's note  I have taken a history, reviewed the chart and examined the patient. I performed a substantive portion of this encounter, including complete performance of at least one of the key components, in conjunction with the APP. I agree with the APP's note, impression and recommendations.    88 year old gentleman presented after syncopal episode.  He is on chronic anticoagulation with Eliquis for history of a flutter.  He is s/p TAVR and pacemaker  He recently had PEG tube placement for significant oropharyngeal dysphagia in [place], unclear etiology thought to be secondary to chronic microvascular disease and previous CVAs??  Hemoglobin 5.9 Tachycardic and hypotensive He is getting PRBC transfusions  PEG tube site is nontender, noted few small clots crusted near the insertion site.  PEG tube is rotating freely No abdominal tenderness or distention He is alert and communicative, responds appropriately  Transfuse to hemoglobin>7 PPI IV 40 mg twice daily N.p.o.  Patient will need optimization of hemodynamic status and also cardiac and neurologic work-up to exclude  any significant cardiac event or CVA prior to considering endoscopic evaluation  Family was not at bedside to discuss goals of care  He is at high risk for undergoing anesthesia or endoscopic procedures Continue supportive care  The patient was provided an opportunity to ask questions and all were answered. The patient agreed with the plan and demonstrated an understanding of the instructions.  Damaris Hippo , MD [phone]

## 2021-03-03 ENCOUNTER — Encounter: Payer: Medicare Other | Admitting: Cardiology

## 2021-03-03 DIAGNOSIS — R195 Other fecal abnormalities: Secondary | ICD-10-CM

## 2021-03-03 DIAGNOSIS — E039 Hypothyroidism, unspecified: Secondary | ICD-10-CM

## 2021-03-03 DIAGNOSIS — R1312 Dysphagia, oropharyngeal phase: Secondary | ICD-10-CM

## 2021-03-03 DIAGNOSIS — R1310 Dysphagia, unspecified: Secondary | ICD-10-CM

## 2021-03-03 DIAGNOSIS — E785 Hyperlipidemia, unspecified: Secondary | ICD-10-CM

## 2021-03-03 DIAGNOSIS — Z931 Gastrostomy status: Secondary | ICD-10-CM

## 2021-03-03 LAB — CBC
HCT: 21.5 % — ABNORMAL LOW (ref 39.0–52.0)
HCT: 20 % — ABNORMAL LOW (ref 39.0–52.0)
HCT: 24.8 % — ABNORMAL LOW (ref 39.0–52.0)
Hemoglobin: 7.3 g/dL — ABNORMAL LOW (ref 13.0–17.0)
Hemoglobin: 6.9 g/dL — CL (ref 13.0–17.0)
Hemoglobin: 8.3 g/dL — ABNORMAL LOW (ref 13.0–17.0)
MCH: 32 pg (ref 26.0–34.0)
MCH: 32.5 pg (ref 26.0–34.0)
MCH: 31 pg (ref 26.0–34.0)
MCHC: 34 g/dL (ref 30.0–36.0)
MCHC: 34.5 g/dL (ref 30.0–36.0)
MCHC: 33.5 g/dL (ref 30.0–36.0)
MCV: 94.3 fL (ref 80.0–100.0)
MCV: 94.3 fL (ref 80.0–100.0)
MCV: 92.5 fL (ref 80.0–100.0)
Platelets: 108 10*3/uL — ABNORMAL LOW (ref 150–400)
Platelets: 102 10*3/uL — ABNORMAL LOW (ref 150–400)
Platelets: 105 10*3/uL — ABNORMAL LOW (ref 150–400)
RBC: 2.28 MIL/uL — ABNORMAL LOW (ref 4.22–5.81)
RBC: 2.12 MIL/uL — ABNORMAL LOW (ref 4.22–5.81)
RBC: 2.68 MIL/uL — ABNORMAL LOW (ref 4.22–5.81)
RDW: 16.1 % — ABNORMAL HIGH (ref 11.5–15.5)
RDW: 16 % — ABNORMAL HIGH (ref 11.5–15.5)
RDW: 18.7 % — ABNORMAL HIGH (ref 11.5–15.5)
WBC: 11.4 10*3/uL — ABNORMAL HIGH (ref 4.0–10.5)
WBC: 10.6 10*3/uL — ABNORMAL HIGH (ref 4.0–10.5)
WBC: 11.8 10*3/uL — ABNORMAL HIGH (ref 4.0–10.5)
nRBC: 0 % (ref 0.0–0.2)
nRBC: 0.2 % (ref 0.0–0.2)
nRBC: 0.3 % — ABNORMAL HIGH (ref 0.0–0.2)

## 2021-03-03 LAB — BASIC METABOLIC PANEL
Anion gap: 9 (ref 5–15)
BUN: 120 mg/dL — ABNORMAL HIGH (ref 8–23)
CO2: 20 mmol/L — ABNORMAL LOW (ref 22–32)
Calcium: 7.7 mg/dL — ABNORMAL LOW (ref 8.9–10.3)
Chloride: 113 mmol/L — ABNORMAL HIGH (ref 98–111)
Creatinine, Ser: 1.65 mg/dL — ABNORMAL HIGH (ref 0.61–1.24)
GFR, Estimated: 37 mL/min — ABNORMAL LOW (ref >60)
Glucose, Bld: 97 mg/dL (ref 70–99)
Potassium: 4.3 mmol/L (ref 3.5–5.1)
Sodium: 142 mmol/L (ref 135–145)

## 2021-03-03 LAB — CBC WITH DIFFERENTIAL/PLATELET
Abs Immature Granulocytes: 0.11 10*3/uL — ABNORMAL HIGH (ref 0.00–0.07)
Basophils Absolute: 0.1 10*3/uL (ref 0.0–0.1)
Basophils Relative: 1 %
Eosinophils Absolute: 0.1 10*3/uL (ref 0.0–0.5)
Eosinophils Relative: 1 %
HCT: 20.7 % — ABNORMAL LOW (ref 39.0–52.0)
Hemoglobin: 7.2 g/dL — ABNORMAL LOW (ref 13.0–17.0)
Immature Granulocytes: 1 %
Lymphocytes Relative: 23 %
Lymphs Abs: 2.4 10*3/uL (ref 0.7–4.0)
MCH: 32.6 pg (ref 26.0–34.0)
MCHC: 34.8 g/dL (ref 30.0–36.0)
MCV: 93.7 fL (ref 80.0–100.0)
Monocytes Absolute: 1.3 10*3/uL — ABNORMAL HIGH (ref 0.1–1.0)
Monocytes Relative: 12 %
Neutro Abs: 6.5 10*3/uL (ref 1.7–7.7)
Neutrophils Relative %: 62 %
Platelets: UNDETERMINED 10*3/uL (ref 150–400)
RBC: 2.21 MIL/uL — ABNORMAL LOW (ref 4.22–5.81)
RDW: 15.8 % — ABNORMAL HIGH (ref 11.5–15.5)
WBC: 10.5 10*3/uL (ref 4.0–10.5)
nRBC: 0 % (ref 0.0–0.2)

## 2021-03-03 LAB — PREPARE RBC (CROSSMATCH)

## 2021-03-03 MED ORDER — SODIUM CHLORIDE 0.9% IV SOLUTION: Freq: Once | INTRAVENOUS | Status: AC

## 2021-03-03 MED ORDER — LACTATED RINGERS IV BOLUS
1000.0000 mL | Freq: Once | INTRAVENOUS | Status: AC
  Administered 2021-03-03: 1000 mL via INTRAVENOUS

## 2021-03-03 MED ORDER — SODIUM CHLORIDE 0.9% IV SOLUTION
Freq: Once | INTRAVENOUS | Status: DC
Start: 2021-03-03 — End: 2021-03-03

## 2021-03-03 NOTE — Progress Notes (Signed)
Date and time results received: 03/03/21 1550 (use smartphrase ".now" to insert current time)  Test: Hgb Critical Value: 6.9  Name of Provider Notified: Lorin Glass  Orders Received? Or Actions Taken?: Actions Taken: MD notified,awaiting orders

## 2021-03-03 NOTE — Progress Notes (Signed)
Subjective:   ON: Patient required 1 L IVF to maintain MAP>65, Hgb 6.9 and s/p 1 unit PRBCs overnight  Patient assessed at bedside this AM. He denies abd pain, shortness of breath, and chest pain. His valve was placed in 2020 at [facility].  Family continues to express desire to pursue evaluation for the cause of the GI bleed.  No other complaints or concerns at this time.  Objective:  Vital signs in last 24 hours: Vitals:   03/03/21 0008 03/03/21 0041 03/03/21 0100 03/03/21 0315  BP: (!) 104/51 (!) 96/40 (!) 103/40 (!) 98/47  Pulse: 69 73  70  Resp: 20 16  20   Temp: 97.6 F (36.4 C) 98 F (36.7 C) 98.1 F (36.7 C) 98 F (36.7 C)  TempSrc: Oral  Oral Axillary  SpO2: 99%   98%  Weight:      Height:       Constitutional:      General: He is not in acute distress.    Appearance: He is ill-appearing.  HENT:     Head: Normocephalic and atraumatic.     Mouth/Throat:     Mouth: Mucous membranes are dry.  Eyes:     Extraocular Movements: Extraocular movements intact.     Pupils: Pupils are equal, round, and reactive to light.  Cardiovascular:     Rate and Rhythm: Normal rate and regular rhythm.     Heart sounds: No murmur heard.   No friction rub. No gallop.  Pulmonary:     Effort: Pulmonary effort is normal.     Breath sounds: Normal breath sounds. No wheezing, rhonchi or rales.  Abdominal:     General: Abdomen is flat. Bowel sounds are normal.     Palpations: Abdomen is soft.     Tenderness: There is no abdominal tenderness.     Comments: PEG tube in place, no signs of bleeding or drainage from the site  Musculoskeletal:        General: No swelling, deformity or signs of injury. There are two small necrotic-appearing wounds at the tip of the great toes with no signs of active bleeding or drainage Skin:    General: Skin is warm and dry.     Comments: Decreased skin turgor  Neurological:     General: No focal deficit present.     Mental Status: He is alert and  oriented to person, place, and time.     Cranial Nerves: No cranial nerve deficit.  Psychiatric:        Mood and Affect: Mood normal.        Behavior: Behavior normal.    Assessment/Plan:  Principal Problem:   Symptomatic anemia Active Problems:   CAD (coronary artery disease)   Atrial flutter (HCC)   Dysphagia   Protein malnutrition (HCC)  Gregory [name] is a 88 y.o. male with a PMHx of aortic stenosis, paroxysmal atrial fibrillation on Eliquis, atrioventricular complete heart block status post permanent pacemaker implantation, HLD, hypothyroidism, iron deficiency, and prior COVID infection in June 2022 who presents to the ED today with a CC of dizziness and near syncope, found to have symptomatic anemia likely secondary to GI bleed.  Symptomatic anemia Hemorrhagic shock Initial blood pressure 90/30 with MAP of 53 on admission. The patient has received a total of 3L of NS boluses and 3 units of PRBCs. Now with BP 107/56, MAP of 70. Suspect GI bleed with positive FOBT. Specifically, there was a concern for upper GI bleed after EGD and  PEG placement during his last hospitalization at [facility].  GI was consulted here and for now are recommending continued supportive care.    After discussion with the patient and and his family at bedside, they continue to express their desire to pursue evaluation with endoscopy. It was further discussed with the family at bedside that endoscopic evaluation could be high risk for this 88 year old with multiple comorbidities.  Our team will meet with the family again at bedside later this afternoon to have a more in-depth discussion on the matter.  Will likely also reach out to our palliative care specialists after discussion with the family this afternoon.  -GI on board, appreciate their recommendations -Continue IV Protonix 40 mg every 12 hours -Trend CBC every 4 hours -Transfusion goal of greater than 7   Dysphagia s/p PEG Pt has a history of oropharyngeal  dysphagia since July 2022. Admitted to [facility] in [facility] for malnutrition secondary to dysphagia. GI was consulted and pt received a Corpak. On 1/10, got a PEG tube placed.  Hold off on speech eval for now, likely that patient will not need this here given that he receives all of his feeds and medicines through his PEG tube. -Continue feeding through PEG tube here -RD consult  History of a flutter S/p TAVR in 2020 Patient had a prosthetic valve placed in 2020 at [facility].  Also status post pacemaker due to complete heart block.  He is currently on Eliquis for this, however we are holding this in the setting of presumed GI bleed.  Does not appear to be on a beta-blocker at home. -Holding Eliquis   AKI on CKD, resolved His creatinine from his last hospitalization in December was around 1.6. Creatinine on admission was 2.07.  Improved to 1.65 this morning after ministration of blood products and fluid boluses. Renal US negative. UA unremarkable. Suspect prerenal etiology -Trend BMP   Hypothyroidism TSH was 3.2.  Patient is on Synthroid at home. -Continue Synthroid 75 mcg   Hyperlipidemia -Continue home atorvastatin  Prior to Admission Living Arrangement: Home, with son Dispo: Anticipated discharge pending medical stability  Orvis Brill, MD 03/03/2021, 7:16 AM Pager: [phone]  After 5pm on weekdays and 1pm on weekends: On Call pager [phone]

## 2021-03-03 NOTE — Progress Notes (Signed)
SLP Cancellation Note  Patient Details Name: Gregory [name] MRN: [id number] DOB: [birth year]   Cancelled treatment:       Reason Eval/Treat Not Completed: Medical issues which prohibited therapy. Per GI note on previous date, pt is to remain NPO. Per RN, no TFs ordered at this time. SLP reached out to MD for clarification, but will hold swallow eval until we get confirmation that medically he is appropriate to have POs as part of our evaluation.     Osie Bond., M.A. [facility] Pager [phone] Office [phone]  03/03/2021, 10:28 AM

## 2021-03-03 NOTE — Consult Note (Addendum)
[facility] Nurse Consult Note: Reason for Consult: Consult requested for bilat feet.  Wound type:  Left heel with dark red-purple deep tissue pressure injury; 3X3cm Left outer foot with Stage 1 pressure injury; 1X1cm Left tip of great toe with black eschar 2X2cm, no odor, drainage, or fluctuance, erythremia surrounding Right heel with Stage 1 pressure injury; .5X.5cm Right outer foot with Stage 1 pressure injury; 1X1cm Right tip of great toe with black eschar; 1.5X1.5cm, no odor, drainage, or fluctuance, erythremia surrounding Pressure Injury POA: Yes Dressing procedure/placement/frequency: Family members at the bedside during assessment; they state they do not know the etiology or how long ago the eschar occurred. Discussed that eschar to toes will not resolve despite topical treatment and is related to decreased blood flow. Son requested that a vascular consult be performed to provide further plan of care. Secure chat message sent to primary team to request this consult to be ordered. Topical treatment orders provided for bedside nurses as follows: Foam dressings to bilat heels and bilat outer feet, change Q 3 days or PRN soiling. Float heels to reduce pressure.  Please re-consult if further assistance is needed.  Thank-you,  Julien Girt MSN, [place], [place], [place], [place]

## 2021-03-03 NOTE — Progress Notes (Signed)
°  Transition of Care [facility]) Screening Note   Patient Details  Name: Dael Howland Date of Birth: [birth year]   Transition of Care [facility] CM/SW Contact:    Dawayne Patricia, RN Phone Number: 03/03/2021, 10:19 AM    Transition of Care Department [facility]) has reviewed patient and no TOC needs have been identified at this time. From home w/ family 24/7. We will continue to monitor patient advancement through interdisciplinary progression rounds. If new patient transition needs arise, please place a TOC consult.

## 2021-03-03 NOTE — Progress Notes (Signed)
The patient and his family were seen at bedside to discuss goals of care.  Patient was asleep during the discussion, but his son and daughter were present in the room.  We discussed that unfortunately we are unable to transfer the patient to [facility] for further care because they are at capacity at their hospital.  In terms of potential outcomes for the patient, we discussed at length the risks and benefits of proceeding with both endoscopy and with watchful waiting. Currently, we are holding his Eliquis due to risk of bleed, however we discussed that the patient has risk of having a stroke in the meantime while we do so.  We also discussed that the patient may not tolerate the procedure especially given his age and comorbidities, as well as the chance that we may not find the source of the bleeding.  Watchful waiting may be an option to see if the bleeding stops on its own, however there are no guarantees that the bleeding will stop without surgery.   The family voiced understanding of all of this information.  They state that they would like the patient to decide, however they would like to discuss this tomorrow when the patient is more awake.  We we will plan to have other discussion tomorrow morning between 8-10 AM and hopefully the patient is more awake then and can be involved in this decision making as well.

## 2021-03-03 NOTE — Progress Notes (Addendum)
[facility] ROUNDING NOTE   Subjective: Sleeping, family at bedside Patient denies any abdominal pain  Patient was hospitalized at [facility] clinic in [place] for evaluation of worsening oropharyngeal dysphagia, he was at risk for aspiration because his epiglottis would not close. He had NG tube feeding prior to PEG placement to improve his nutritional status.  During that hospitalization he had a decline in hemoglobin which was thought to be related to superficial mucosal trauma from NG tube.  He was managed conservatively with PPI and Carafate.  He had PEG placement under moderate sedation last week  His hemoglobin at time of discharge from [facility] was 7  He did not have a bowel movement for 1 week until yesterday where he had dark bowel movement that was fecal Hemoccult positive  No vomiting or blood through PEG tube   Objective: Vital signs in last 24 hours: Temp:  [97.2 F (36.2 C)-98.1 F (36.7 C)] 97.2 F (36.2 C) (01/20 1056) Pulse Rate:  [68-97] 70 (01/20 1056) Resp:  [16-33] 20 (01/20 1056) BP: (87-126)/(36-75) 107/56 (01/20 1056) SpO2:  [95 %-100 %] 99 % (01/20 1056) Last BM Date: 03/02/21 General: NAD Abdomen: soft, NTND, PEG     Intake/Output from previous day: 01/19 0701 - 01/20 0700 In: 5793.1 [I.V.:2509.1; ERXVQ:0086; IV PYPPJKDTO:6712] Out: 600 [Urine:600] Intake/Output this shift: Total I/O In: -  Out: 1300 [Urine:1300]   Lab Results: Recent Labs    03/02/21 1100 03/02/21 2220 03/03/21 0617  WBC 12.3*  --  10.5  HGB 5.9* 6.9* 7.2*  PLT 207  --  PLATELET CLUMPS NOTED ON SMEAR, UNABLE TO ESTIMATE  MCV 101.6*  --  93.7   BMET Recent Labs    03/02/21 1100 03/03/21 0617  NA 140 142  K 4.9 4.3  CL 101 113*  CO2 26 20*  GLUCOSE 139* 97  BUN 124* 120*  CREATININE 2.07* 1.65*  CALCIUM 8.7* 7.7*   LFT Recent Labs    03/02/21 1100  PROT 5.5*  ALBUMIN 2.6*  AST 27  ALT 24  ALKPHOS 45  BILITOT 0.5   PT/INR Recent Labs     03/02/21 1100  INR 1.5*      Imaging/Other results: CT Head Wo Contrast  Result Date: 03/02/2021 CLINICAL DATA:  Dizziness. EXAM: CT HEAD WITHOUT CONTRAST TECHNIQUE: Contiguous axial images were obtained from the base of the skull through the vertex without intravenous contrast. RADIATION DOSE REDUCTION: This exam was performed according to the departmental dose-optimization program which includes automated exposure control, adjustment of the mA and/or kV according to patient size and/or use of iterative reconstruction technique. COMPARISON:  None. FINDINGS: Brain: No evidence of acute infarction, hemorrhage, hydrocephalus, extra-axial collection or mass lesion/mass effect. Old right cerebellar infarct. Moderate generalized cerebral atrophy, within normal limits for age. Scattered moderate periventricular and subcortical white matter hypodensities are nonspecific, but favored to reflect chronic microvascular ischemic changes. Vascular: Calcified atherosclerosis at the skull base. No hyperdense vessel. Skull: Normal. Negative for fracture or focal lesion. Sinuses/Orbits: No acute finding. Other: None. IMPRESSION: 1. No acute intracranial abnormality.  Old right cerebellar infarct. 2. Moderate atrophy and chronic microvascular ischemic changes. Electronically Signed   By: Titus Dubin M.D.   On: 03/02/2021 12:27   US RENAL  Result Date: 03/02/2021 CLINICAL DATA:  Acute kidney injury EXAM: RENAL / URINARY TRACT ULTRASOUND COMPLETE COMPARISON:  None. FINDINGS: Right Kidney: Renal measurements: 8.4 x 5.3 x 4 cm = volume: 92.1 ML. Echogenicity increased. Query poorly visualized cystic subcentimeter lesion  within the superior pole of the right kidney. No mass or hydronephrosis visualized. Left Kidney: Renal measurements: 9.6 x 5.6 x 3.8 cm = volume: 107 mL. Echogenicity increased. No mass or hydronephrosis visualized. Urinary bladder: Appears normal for degree of bladder distention. Other: None.  IMPRESSION: Bilateral renal echogenicity suggestive of renal parenchymal disease. Electronically Signed   By: Iven Finn M.D.   On: 03/02/2021 17:41   DG Chest Port 1 View  Result Date: 03/02/2021 CLINICAL DATA:  Dizziness. EXAM: PORTABLE CHEST 1 VIEW COMPARISON:  07/25/2020 FINDINGS: No focal consolidation. No pleural effusion or pneumothorax. Heart and mediastinal contours are unremarkable. Cardiac pacemaker in satisfactory position. Prior TAVR. No acute osseous abnormality. IMPRESSION: No active disease. Electronically Signed   By: Kathreen Devoid M.D.   On: 03/02/2021 10:46      Assessment &Plan  88 year old very pleasant gentleman with history of heart block s/p pacemaker, TAVR 2020, a flutter on chronic Eliquis  Last dose of Eliquis was 1/18  Hemoglobin responded appropriately to PRBC transfusion No evidence of active GI hemorrhage  Heme positive stool in the setting of recent PEG placement and intervention, also history of NG tube related mucosal trauma  Discussed in detail that potential risks will outweigh benefits associated with endoscopic evaluation, family agreed that close monitoring and conservative management is the best route at this point Defer EGD Continue PPI IV twice daily Restart tube feeds Carafate 1 g suspension every 8 hours  Continue to hold Eliquis  Dr. Benson Norway will round on the patient tomorrow   This visit required 50 minutes of patient care (this includes precharting, chart review, review of results, face-to-face time used for counseling as well as treatment plan and follow-up. Family was provided an opportunity to ask questions and all were answered. They agreed with the plan and demonstrated an understanding of the instructions.   Damaris Hippo , MD [phone]  [facility]

## 2021-03-03 NOTE — Progress Notes (Signed)
COVID Boosters given [employer] 11/29/19, [employer] 06/17/2020, and [employer] 12/06/2020. Vaccination Card given.  Daymon Larsen, RN

## 2021-03-04 DIAGNOSIS — D649 Anemia, unspecified: Secondary | ICD-10-CM

## 2021-03-04 LAB — TYPE AND SCREEN
ABO/RH(D): A POS
Antibody Screen: NEGATIVE
Unit division: 0
Unit division: 0
Unit division: 0
Unit division: 0

## 2021-03-04 LAB — BPAM RBC
Blood Product Expiration Date: 202302062359
Blood Product Expiration Date: 202302062359
Blood Product Expiration Date: 202302082359
Blood Product Expiration Date: 202302112359
ISSUE DATE / TIME: 202301191318
ISSUE DATE / TIME: 202301191609
ISSUE DATE / TIME: 202301200036
ISSUE DATE / TIME: 202301201714
Unit Type and Rh: 6200
Unit Type and Rh: 6200
Unit Type and Rh: 6200
Unit Type and Rh: 6200

## 2021-03-04 LAB — BASIC METABOLIC PANEL
Anion gap: 4 — ABNORMAL LOW (ref 5–15)
BUN: 91 mg/dL — ABNORMAL HIGH (ref 8–23)
CO2: 20 mmol/L — ABNORMAL LOW (ref 22–32)
Calcium: 7.9 mg/dL — ABNORMAL LOW (ref 8.9–10.3)
Chloride: 122 mmol/L — ABNORMAL HIGH (ref 98–111)
Creatinine, Ser: 1.55 mg/dL — ABNORMAL HIGH (ref 0.61–1.24)
GFR, Estimated: 40 mL/min — ABNORMAL LOW (ref >60)
Glucose, Bld: 87 mg/dL (ref 70–99)
Potassium: 3.9 mmol/L (ref 3.5–5.1)
Sodium: 146 mmol/L — ABNORMAL HIGH (ref 135–145)

## 2021-03-04 LAB — CBC
HCT: 23.3 % — ABNORMAL LOW (ref 39.0–52.0)
Hemoglobin: 8 g/dL — ABNORMAL LOW (ref 13.0–17.0)
MCH: 31.7 pg (ref 26.0–34.0)
MCHC: 34.3 g/dL (ref 30.0–36.0)
MCV: 92.5 fL (ref 80.0–100.0)
Platelets: 103 10*3/uL — ABNORMAL LOW (ref 150–400)
RBC: 2.52 MIL/uL — ABNORMAL LOW (ref 4.22–5.81)
RDW: 18.8 % — ABNORMAL HIGH (ref 11.5–15.5)
WBC: 10.9 10*3/uL — ABNORMAL HIGH (ref 4.0–10.5)
nRBC: 0.4 % — ABNORMAL HIGH (ref 0.0–0.2)

## 2021-03-04 LAB — GLUCOSE, CAPILLARY: Glucose-Capillary: 100 mg/dL — ABNORMAL HIGH (ref 70–99)

## 2021-03-04 MED ORDER — DEXTROSE-NACL 5-0.45 % IV SOLN: INTRAVENOUS | Status: DC

## 2021-03-04 MED ORDER — SUCRALFATE 1 GM/10ML PO SUSP
1.0000 g | Freq: Three times a day (TID) | ORAL | Status: DC
  Administered 2021-03-04 – 2021-03-10 (×19): 1 g
  Filled 2021-03-04 (×18): qty 10

## 2021-03-04 NOTE — Progress Notes (Signed)
HD#2 Subjective:  Overnight Events: no event  Patient is seen at bedside.  He is sleepy but awakes to verbal stimulation.  He denies any abdominal pain.  Pleasantly confused per family.  His daughter and son were at bedside.  Family is wondering about his PEG tube retention bumpers.  Objective:  Vital signs in last 24 hours: Vitals:   03/03/21 2010 03/04/21 0054 03/04/21 0331 03/04/21 0821  BP: (!) 112/47 109/61 102/72 (!) 99/56  Pulse: 70 69 70 71  Resp:  20 20 20   Temp: (!) 97.4 F (36.3 C) (!) 97.3 F (36.3 C) (!) 97.4 F (36.3 C) 97.8 F (36.6 C)  TempSrc: Axillary Oral Oral Oral  SpO2: 98% 96% 98% 98%  Weight:      Height:       Supplemental O2: Room Air SpO2: 98 %   Physical Exam:  Physical Exam Constitutional:      General: He is not in acute distress. HENT:     Head: Normocephalic.  Eyes:     General:        Right eye: No discharge.        Left eye: No discharge.     Conjunctiva/sclera: Conjunctivae normal.  Cardiovascular:     Rate and Rhythm: Normal rate and regular rhythm.  Pulmonary:     Effort: Pulmonary effort is normal. No respiratory distress.  Abdominal:     Palpations: Abdomen is soft.     Comments: PEG tube in place.  Site appears clean and noninfected.  No discharge.  PEG tube retention bumpers noted.  Skin:    General: Skin is warm.  Neurological:     Mental Status: He is alert.  Psychiatric:        Mood and Affect: Mood normal.        Behavior: Behavior normal.    Filed Weights   03/02/21 1042  Weight: 68 kg     Intake/Output Summary (Last 24 hours) at 03/04/2021 1313 Last data filed at 03/04/2021 0332 Gross per 24 hour  Intake 841.19 ml  Output 1750 ml  Net -908.81 ml   Net IO Since Admission: 2,984.24 mL [03/04/21 1313]  Pertinent Labs: CBC Latest Ref Rng & Units 03/04/2021 03/03/2021 03/03/2021  WBC 4.0 - 10.5 K/uL 10.9(H) 11.8(H) 10.6(H)  Hemoglobin 13.0 - 17.0 g/dL 8.0(L) 8.3(L) 6.9(LL)  Hematocrit 39.0 - 52.0 %  23.3(L) 24.8(L) 20.0(L)  Platelets 150 - 400 K/uL 103(L) 105(L) 102(L)    CMP Latest Ref Rng & Units 03/04/2021 03/03/2021 03/02/2021  Glucose 70 - 99 mg/dL 87 97 139(H)  BUN 8 - 23 mg/dL 91(H) 120(H) 124(H)  Creatinine 0.61 - 1.24 mg/dL 1.55(H) 1.65(H) 2.07(H)  Sodium 135 - 145 mmol/L 146(H) 142 140  Potassium 3.5 - 5.1 mmol/L 3.9 4.3 4.9  Chloride 98 - 111 mmol/L 122(H) 113(H) 101  CO2 22 - 32 mmol/L 20(L) 20(L) 26  Calcium 8.9 - 10.3 mg/dL 7.9(L) 7.7(L) 8.7(L)  Total Protein 6.5 - 8.1 g/dL - - 5.5(L)  Total Bilirubin 0.3 - 1.2 mg/dL - - 0.5  Alkaline Phos 38 - 126 U/L - - 45  AST 15 - 41 U/L - - 27  ALT 0 - 44 U/L - - 24    Imaging: No results found.  Assessment/Plan:   Principal Problem:   Symptomatic anemia Active Problems:   CAD (coronary artery disease)   Atrial flutter ([place])   Dysphagia   Protein malnutrition ([place])   Patient Summary: Gregory [name] is a  88 y.o. male with a PMHx of aortic stenosis, paroxysmal atrial fibrillation on Eliquis, atrioventricular complete heart block status post permanent pacemaker implantation, HLD, hypothyroidism, iron deficiency, and prior COVID infection in June 2022 who presents to the ED today with a CC of dizziness and near syncope, found to have symptomatic anemia likely secondary to GI bleed.  Symptomatic anemia 2/2 suspected upper GI bleed Hemoglobin stable at 8 this morning with normal blood pressure.  Family has spoken to GI team this morning and decided not to proceed with EGD given risks higher than benefits.  They would like to continue watchful waiting until hemoglobin stabilizes.  They are aware of the risk of CVA when holding Eliquis.  I also introduced palliative medicine to help with transition of care after discharge.  Family agrees with the referral. -GI on board, appreciate their recommendations -Palliative medicine consulted -Continue IV Protonix 40 mg BID and added Sucrafate -CBC in AM -Transfusion goal of greater  than 7   Dysphagia s/p PEG Pt has a history of oropharyngeal dysphagia since July 2022. Got a PEG tube placed on 1/10.   -Continue feeding through PEG tube here -RD consult   History of a flutter S/p TAVR in 2020 Patient had a prosthetic valve placed in 2020 at [facility].  Also status post pacemaker due to complete heart block.   -Holding Eliquis   AKI on CKD, resolved His creatinine from his last hospitalization in December was around 1.6. Renal US negative. UA unremarkable.  His sodium 146 this morning so we will continue D5 half-normal saline fluid. -Trend BMP   Hypothyroidism TSH was 3.2.  Patient is on Synthroid at home. -Continue Synthroid 75 mcg   Hyperlipidemia -Continue home atorvastatin  Diet: NPO IVF: D5 1/2 NS,125cc/hr VTE: SCDs Code: Full PT/OT recs: None, none. TOC recs:   Dispo: Anticipated discharge to Home in 3 days pending hemoglobin stabilized.   Gaylan Gerold, DO 03/04/2021, 1:13 PM Pager: [phone]  Please contact the on call pager after 5 pm and on weekends at [phone].

## 2021-03-04 NOTE — Progress Notes (Signed)
Subjective: No acute events.  Objective: Vital signs in last 24 hours: Temp:  [97.1 F (36.2 C)-97.8 F (36.6 C)] 97.8 F (36.6 C) (01/21 0821) Pulse Rate:  [69-72] 71 (01/21 0821) Resp:  [20-29] 20 (01/21 0821) BP: (99-126)/(41-72) 99/56 (01/21 0821) SpO2:  [96 %-99 %] 98 % (01/21 0821) Last BM Date: 03/02/21  Intake/Output from previous day: 01/20 0701 - 01/21 0700 In: 841.2 [I.V.:526.2; Blood:315] Out: 3050 [Urine:3050] Intake/Output this shift: No intake/output data recorded.  General appearance: alert, no distress, and weak appearing GI: soft, non-tender; bowel sounds normal; no masses,  no organomegaly  Lab Results: Recent Labs    03/03/21 1515 03/03/21 2309 03/04/21 0416  WBC 10.6* 11.8* 10.9*  HGB 6.9* 8.3* 8.0*  HCT 20.0* 24.8* 23.3*  PLT 102* 105* 103*   BMET Recent Labs    03/02/21 1100 03/03/21 0617 03/04/21 0416  NA 140 142 146*  K 4.9 4.3 3.9  CL 101 113* 122*  CO2 26 20* 20*  GLUCOSE 139* 97 87  BUN 124* 120* 91*  CREATININE 2.07* 1.65* 1.55*  CALCIUM 8.7* 7.7* 7.9*   LFT Recent Labs    03/02/21 1100  PROT 5.5*  ALBUMIN 2.6*  AST 27  ALT 24  ALKPHOS 45  BILITOT 0.5   PT/INR Recent Labs    03/02/21 1100  LABPROT 18.4*  INR 1.5*   Hepatitis Panel No results for input(s): HEPBSAG, HCVAB, HEPAIGM, HEPBIGM in the last 72 hours. C-Diff No results for input(s): CDIFFTOX in the last 72 hours. Fecal Lactopherrin No results for input(s): FECLLACTOFRN in the last 72 hours.  Studies/Results: CT Head Wo Contrast  Result Date: 03/02/2021 CLINICAL DATA:  Dizziness. EXAM: CT HEAD WITHOUT CONTRAST TECHNIQUE: Contiguous axial images were obtained from the base of the skull through the vertex without intravenous contrast. RADIATION DOSE REDUCTION: This exam was performed according to the departmental dose-optimization program which includes automated exposure control, adjustment of the mA and/or kV according to patient size and/or use of  iterative reconstruction technique. COMPARISON:  None. FINDINGS: Brain: No evidence of acute infarction, hemorrhage, hydrocephalus, extra-axial collection or mass lesion/mass effect. Old right cerebellar infarct. Moderate generalized cerebral atrophy, within normal limits for age. Scattered moderate periventricular and subcortical white matter hypodensities are nonspecific, but favored to reflect chronic microvascular ischemic changes. Vascular: Calcified atherosclerosis at the skull base. No hyperdense vessel. Skull: Normal. Negative for fracture or focal lesion. Sinuses/Orbits: No acute finding. Other: None. IMPRESSION: 1. No acute intracranial abnormality.  Old right cerebellar infarct. 2. Moderate atrophy and chronic microvascular ischemic changes. Electronically Signed   By: Titus Dubin M.D.   On: 03/02/2021 12:27   US RENAL  Result Date: 03/02/2021 CLINICAL DATA:  Acute kidney injury EXAM: RENAL / URINARY TRACT ULTRASOUND COMPLETE COMPARISON:  None. FINDINGS: Right Kidney: Renal measurements: 8.4 x 5.3 x 4 cm = volume: 92.1 ML. Echogenicity increased. Query poorly visualized cystic subcentimeter lesion within the superior pole of the right kidney. No mass or hydronephrosis visualized. Left Kidney: Renal measurements: 9.6 x 5.6 x 3.8 cm = volume: 107 mL. Echogenicity increased. No mass or hydronephrosis visualized. Urinary bladder: Appears normal for degree of bladder distention. Other: None. IMPRESSION: Bilateral renal echogenicity suggestive of renal parenchymal disease. Electronically Signed   By: Iven Finn M.D.   On: 03/02/2021 17:41   DG Chest Port 1 View  Result Date: 03/02/2021 CLINICAL DATA:  Dizziness. EXAM: PORTABLE CHEST 1 VIEW COMPARISON:  07/25/2020 FINDINGS: No focal consolidation. No pleural effusion or pneumothorax. Heart  and mediastinal contours are unremarkable. Cardiac pacemaker in satisfactory position. Prior TAVR. No acute osseous abnormality. IMPRESSION: No active  disease. Electronically Signed   By: Kathreen Devoid M.D.   On: 03/02/2021 10:46    Medications: Scheduled:  atorvastatin  40 mg Per Tube QHS   levothyroxine  75 mcg Per Tube Q0600   pantoprazole (PROTONIX) IV  40 mg Intravenous Q12H   Continuous:  Assessment/Plan: 1) Anemia. 2) Oropharyngeal dysphagia.   The patient received a blood transfusion yesterday.  His HGB increased appropriately, but there was mild drop down.  It is not clear if this is equilibration or lab variance.  Plan: 1) Follow CBC and reassess tomorrow.  LOS: 2 days   Bennie Chirico D 03/04/2021, 8:48 AM

## 2021-03-04 NOTE — Progress Notes (Signed)
SLP Cancellation Note  Patient Details Name: Gregory [name] MRN: [id number] DOB: 1920-11-20   Cancelled treatment:       Reason Eval/Treat Not Completed: Patient not medically ready. Per chart, pt will not have EGD, but will resume tube feeds per GI. Unsure if PO trials should be attempted yet. SLP will be available in the am today if pt needs to be seen, secure chat me if needed.  Herbie Baltimore, [place]  Acute Rehabilitation Services Office [phone]    Lynann Beaver 03/04/2021, 8:03 AM

## 2021-03-05 DIAGNOSIS — K921 Melena: Secondary | ICD-10-CM

## 2021-03-05 DIAGNOSIS — I4892 Unspecified atrial flutter: Secondary | ICD-10-CM

## 2021-03-05 DIAGNOSIS — Z789 Other specified health status: Secondary | ICD-10-CM

## 2021-03-05 DIAGNOSIS — Z515 Encounter for palliative care: Secondary | ICD-10-CM

## 2021-03-05 DIAGNOSIS — N179 Acute kidney failure, unspecified: Secondary | ICD-10-CM

## 2021-03-05 LAB — GLUCOSE, CAPILLARY
Glucose-Capillary: 138 mg/dL — ABNORMAL HIGH (ref 70–99)
Glucose-Capillary: 111 mg/dL — ABNORMAL HIGH (ref 70–99)
Glucose-Capillary: 130 mg/dL — ABNORMAL HIGH (ref 70–99)
Glucose-Capillary: 138 mg/dL — ABNORMAL HIGH (ref 70–99)
Glucose-Capillary: 149 mg/dL — ABNORMAL HIGH (ref 70–99)

## 2021-03-05 LAB — CBC
HCT: 24.1 % — ABNORMAL LOW (ref 39.0–52.0)
Hemoglobin: 7.9 g/dL — ABNORMAL LOW (ref 13.0–17.0)
MCH: 31 pg (ref 26.0–34.0)
MCHC: 32.8 g/dL (ref 30.0–36.0)
MCV: 94.5 fL (ref 80.0–100.0)
Platelets: 102 10*3/uL — ABNORMAL LOW (ref 150–400)
RBC: 2.55 MIL/uL — ABNORMAL LOW (ref 4.22–5.81)
RDW: 19.1 % — ABNORMAL HIGH (ref 11.5–15.5)
WBC: 10.4 10*3/uL (ref 4.0–10.5)
nRBC: 0.2 % (ref 0.0–0.2)

## 2021-03-05 MED ORDER — FREE WATER
200.0000 mL | Freq: Four times a day (QID) | Status: DC
  Administered 2021-03-05 – 2021-03-10 (×22): 200 mL

## 2021-03-05 MED ORDER — OSMOLITE 1.5 CAL PO LIQD
237.0000 mL | Freq: Four times a day (QID) | ORAL | Status: DC
  Administered 2021-03-05 – 2021-03-06 (×5): 237 mL
  Filled 2021-03-05 (×8): qty 237

## 2021-03-05 MED ORDER — OSMOLITE 1.2 CAL PO LIQD: 1000.0000 mL | ORAL | Status: DC

## 2021-03-05 NOTE — Progress Notes (Addendum)
Brief Nutrition Note  Consult received for enteral/tube feeding initiation and management.  Adult Enteral Nutrition Protocol initiated. Full assessment to follow if still admitted. Changed to bolus feeds as this was what pt did at home.  Pt can resume his home tube feeding formula after discharge.  **Addendum: Dr. Alfonse Spruce reached out about free water flushes. Placed order for 200 ml every 6 hours or QID (800 ml).  Admitting Dx: AKI (acute kidney injury) ([place]) [N17.9] Gastrointestinal hemorrhage with melena [K92.1] Symptomatic anemia [D64.9]  Body mass index is 22.53 kg/m. Pt meets criteria for normal based on current BMI.  Labs:  Recent Labs  Lab 03/02/21 1100 03/03/21 0617 03/04/21 0416  NA 140 142 146*  K 4.9 4.3 3.9  CL 101 113* 122*  CO2 26 20* 20*  BUN 124* 120* 91*  CREATININE 2.07* 1.65* 1.55*  CALCIUM 8.7* 7.7* 7.9*  GLUCOSE 139* 97 87    Clayton Bibles, MS, RD, LDN Inpatient Clinical Dietitian Contact information available via Amion

## 2021-03-05 NOTE — TOC Initial Note (Signed)
Transition of Care [facility]) - Initial/Assessment Note    Patient Details  Name: Gregory [name] MRN: [id number] Date of Birth: Mar 18, 1920  [facility] CM/SW Contact:    Carles Collet, RN Phone Number: 03/05/2021, 1:01 PM  Clinical Narrative:       Damaris Schooner w patient's son. Verified they would like to continue [facility] services w [facility]. HH- [facility], active for RN PT OT, notified [facility] liaison and they are following. NEEDS HH orders. DME- Tube feed supplies shipped from company in [place]. Son could not remember name of company but states they have two boxes of supplies at home. Family administers feeds. Patient has WC and hospital bed at home. Son asking about hospital bed mattress. Discussed best mattress for bed sores is air mattress. He will try to get one from [place] where they got the bed from on Monday. Palliative- referral initiated by PCP office, confirmed choice w son and notified [facility] of admission.        Transportation- Will need PTAR. Verified address on file w son.        Expected Discharge Plan: [place] Barriers to Discharge: Continued Medical Work up   Patient Goals and CMS Choice Patient states their goals for this hospitalization and ongoing recovery are:: return home CMS Medicare.gov Compare Post Acute Care list provided to:: Other (Comment Required) Choice offered to / list presented to : Adult Children  Expected Discharge Plan and Services Expected Discharge Plan: [place]   Discharge Planning Services: CM Consult Post Acute Care Choice: Durable Medical Equipment, Home Health Living arrangements for the past 2 months: Single Family Home                                      Prior Living Arrangements/Services Living arrangements for the past 2 months: Single Family Home Lives with:: Adult Children                   Activities of Daily Living      Permission  Sought/Granted                  Emotional Assessment              Admission diagnosis:  AKI (acute kidney injury) ([place]) [N17.9] Gastrointestinal hemorrhage with melena [K92.1] Symptomatic anemia [D64.9] Patient Active Problem List   Diagnosis Date Noted   Gastrointestinal hemorrhage with melena    Symptomatic anemia 03/02/2021   Atrial flutter ([place]) 03/02/2021   Dysphagia 03/02/2021   Protein malnutrition ([place]) 03/02/2021   Chronic anticoagulation 02/01/2020   CAD (coronary artery disease) 02/01/2020   CRI (chronic renal insufficiency), stage 3 (moderate) ([place]) 02/01/2020   Dizziness 02/01/2020   S/P TAVR (transcatheter aortic valve replacement) 10/15/2014   Bradycardia 09/08/2013   Pacemaker 09/08/2013   Pre-operative cardiovascular examination 09/08/2013   Inguinal hernia without mention of obstruction or gangrene, unilateral or unspecified, (not specified as recurrent)-left 08/11/2013   Chronic atrial fibrillation ([place]) 08/11/2013   PCP:  Orpah Greek, DO Pharmacy:   [id number] [name], [place] [address] [place] [address] Phone: [phone] Fax: [phone]     Social Determinants of Health (SDOH) Interventions    Readmission Risk Interventions No flowsheet data found.

## 2021-03-05 NOTE — Progress Notes (Signed)
[facility] ([facility])  Hospital Liaison: RN note         This patient has been referred to our palliative care services in the community.  [facility] will continue to follow for any discharge planning needs and to coordinate continuation of palliative care in the outpatient setting.    If you have questions or need assistance, please call [phone] or contact the hospital Liaison listed on AMION.      Thank you for this referral.         Mary Anne Robertson, RN, CCM  [facility] Hospital Liaison   [phone] 

## 2021-03-05 NOTE — Consult Note (Signed)
Consultation Note Date: 03/05/2021   Patient Name: Gregory [name]  DOB: 20-May-1920  MRN: [id number]  Age / Sex: 88 y.o., male  PCP: Orpah Greek, DO Referring Physician: Lucious Groves, DO  Reason for Consultation: Establishing goals of care  HPI/Patient Profile: 88 y.o. male  with past medical history of aortic stenosis, proximal A. fib (Eliquis), atrioventricular complete heart block status post permanent pacemaker implantation, TAVR (2020), HLD, hypothyroidism, iron deficiency anemia, COVID 19 (June 2022), and oropharyngeal dysphagia (PEG placed 02/21/2021) admitted on 03/02/2021 with dizziness and near syncopal episode at home.  Patient found to be anemic with GI bleed..   Clinical Assessment and Goals of Care: I have reviewed medical records including EPIC notes, labs and imaging, assessed the patient and then met with patient and his son and daughter at bedside to discuss diagnosis prognosis, GOC, EOL wishes, disposition and options.  Patient is extremely hard of hearing despite use of both hearing aids.  I introduced Palliative Medicine as specialized medical care for people living with serious illness. It focuses on providing relief from the symptoms and stress of a serious illness. The goal is to improve quality of life for both the patient and the family.  We discussed a brief life review of the patient.  Patient shares would be easier to give a list of things he did not do in his life.  However, he did not elaborate on what his life work was.  As far as functional and nutritional status, family shares that patient was independent and walking with a cane in December 2020.  Family outlines the medical complications from patient's time at [facility] as well as current hospitalization. They share patient has steadily decline in functional ability over the past three weeks.  We discussed  patient's current illness and what it means in the larger context of patient's on-going co-morbidities. I attempted to elicit values and goals of care important to the patient.  Patient verbalized he wants to go home.  Family also shared that patient is always wish to remain at home.  Advance directives, concepts specific to code status, artificial feeding and hydration, and rehospitalization were considered and discussed.  Family verbalizes that patient has conformed on multiple occasions that he would like to remain a full code, receive CPR, and be placed on a ventilator to preserve his life.  Family shares concern that patient will become dehydrated again.  Family also concerned about starting Eliquis "in a few weeks". Education offered regarding concept specific to human mortality and the limitations of medical interventions to prolong life.    Therapeutic silence and active listening provided for patient's son and daughter to share their thoughts and emotions regarding patient's current health status.  Both shared they just want to do anything and everything to make sure their dad has all the support he needs to go home safely.  Family concerned that level of care with home health is not enough.  I shared social worker can further detail what other options are available  at discharge.  Outpatient palliative services described in detail and both son and daughter were in agreement to have this extra layer support at discharge.   Discussed with patient/family the importance of continued conversation with family and the medical providers regarding overall plan of care and treatment options, ensuring decisions are within the context of the patients values and GOCs.    Questions and concerns were addressed. The family was encouraged to call with questions or concerns.   Primary Decision Maker PATIENT  Code Status/Advance Care Planning: Full code  Prognosis:   Unable to determine  Discharge  Planning: To Be Determined  Primary Diagnoses: Present on Admission:  Symptomatic anemia  CAD (coronary artery disease)   Physical Exam Vitals and nursing note reviewed.  Constitutional:      General: He is not in acute distress.    Appearance: Normal appearance. He is not toxic-appearing.  HENT:     Head: Normocephalic.     Mouth/Throat:     Mouth: Mucous membranes are dry.  Cardiovascular:     Rate and Rhythm: Normal rate.     Pulses: Normal pulses.  Pulmonary:     Effort: Pulmonary effort is normal.  Abdominal:     Palpations: Abdomen is soft.  Musculoskeletal:     Comments: Generalized weakness  Skin:    General: Skin is warm.  Neurological:     Mental Status: He is alert. Mental status is at baseline.  Psychiatric:        Mood and Affect: Mood normal.        Behavior: Behavior normal.        Thought Content: Thought content normal.        Judgment: Judgment normal.    Palliative Assessment/Data:     I discussed this patient's plan of care with patient, patient's son and daughter, TOC SW Lattie Haw.  Thank you for this consult. Palliative medicine will continue to follow and assist holistically.   Time Total: 80 minutes Greater than 50%  of this time was spent counseling and coordinating care related to the above assessment and plan.  Signed by: Jordan Hawks, DNP, FNP-BC Palliative Medicine    Please contact Palliative Medicine Team phone at [phone] for questions and concerns.  For individual provider: See Shea Evans

## 2021-03-05 NOTE — Progress Notes (Signed)
Subjective: No acute events.  Objective: Vital signs in last 24 hours: Temp:  [97.2 F (36.2 C)-98.2 F (36.8 C)] 97.2 F (36.2 C) (01/22 0434) Pulse Rate:  [70-71] 70 (01/22 0434) Resp:  [19-20] 20 (01/22 0434) BP: (99-127)/(49-56) 127/53 (01/22 0434) SpO2:  [96 %-100 %] 97 % (01/22 0434) Weight:  [67.2 kg] 67.2 kg (01/22 0534) Last BM Date: 03/02/21  Intake/Output from previous day: 01/21 0701 - 01/22 0700 In: 2177.3 [I.V.:2037.3] Out: 2150 [Urine:2150] Intake/Output this shift: No intake/output data recorded.  General appearance: alert and no distress GI: soft, non-tender; bowel sounds normal; no masses,  no organomegaly and PEG site appears intact and no evidence of bleeding.  Lab Results: Recent Labs    03/03/21 2309 03/04/21 0416 03/05/21 0031  WBC 11.8* 10.9* 10.4  HGB 8.3* 8.0* 7.9*  HCT 24.8* 23.3* 24.1*  PLT 105* 103* 102*   BMET Recent Labs    03/02/21 1100 03/03/21 0617 03/04/21 0416  NA 140 142 146*  K 4.9 4.3 3.9  CL 101 113* 122*  CO2 26 20* 20*  GLUCOSE 139* 97 87  BUN 124* 120* 91*  CREATININE 2.07* 1.65* 1.55*  CALCIUM 8.7* 7.7* 7.9*   LFT Recent Labs    03/02/21 1100  PROT 5.5*  ALBUMIN 2.6*  AST 27  ALT 24  ALKPHOS 45  BILITOT 0.5   PT/INR Recent Labs    03/02/21 1100  LABPROT 18.4*  INR 1.5*   Hepatitis Panel No results for input(s): HEPBSAG, HCVAB, HEPAIGM, HEPBIGM in the last 72 hours. C-Diff No results for input(s): CDIFFTOX in the last 72 hours. Fecal Lactopherrin No results for input(s): FECLLACTOFRN in the last 72 hours.  Studies/Results: No results found.  Medications: Scheduled:  atorvastatin  40 mg Per Tube QHS   levothyroxine  75 mcg Per Tube Q0600   pantoprazole (PROTONIX) IV  40 mg Intravenous Q12H   sucralfate  1 g Per Tube Q8H   Continuous:  dextrose 5 % and 0.45% NaCl 125 mL/hr at 03/05/21 0431    Assessment/Plan: 1) Anemia - stable. 2) Oropharyngeal dysphagia.   The patient's HGB is  essentially stable.  He can be discharged home, but he needs close follow up for his HGB.  There was a question about the PEG bumper and I am not familiar with this particular PEG.  Several other satellite bumpers were identified.  They may be used for gastropexy.  Plan: 1) Okay to D/C home, but with close follow up. 2) Follow up with [facility].  LOS: 3 days   Kyleigh Nannini D 03/05/2021, 7:35 AM

## 2021-03-05 NOTE — Progress Notes (Signed)
HD#3 Subjective:  Overnight Events: none   Patient seen at bedside this AM with daughter and son present. He does not have any concerns at this time. His daughter had concerns about if should resume Eliquis a few weeks after discharge.  They are also concerned about monitoring his hemoglobin at home because his PCP at [facility] is far away.  They are not planning to find a PCP closer to home.  Objective:  Vital signs in last 24 hours: Vitals:   03/04/21 1957 03/04/21 2318 03/05/21 0434 03/05/21 0534  BP: (!) 126/51 (!) 119/52 (!) 127/53   Pulse: 70 70 70   Resp: 20 19 20    Temp: (!) 97.5 F (36.4 C) (!) 97.3 F (36.3 C) (!) 97.2 F (36.2 C)   TempSrc: Axillary Oral Oral   SpO2: 98% 100% 97%   Weight:    67.2 kg  Height:       Supplemental O2: Room Air SpO2: 97 %   Physical Exam:  Physical Exam Constitutional:      General: He is not in acute distress.    Appearance: He is ill-appearing.  Eyes:     General:        Right eye: No discharge.        Left eye: No discharge.     Conjunctiva/sclera: Conjunctivae normal.  Cardiovascular:     Rate and Rhythm: Normal rate and regular rhythm.  Pulmonary:     Effort: Pulmonary effort is normal. No respiratory distress.  Abdominal:     Palpations: Abdomen is soft.     Comments: PEG tube in place.  Appears clean and noninfected  Skin:    General: Skin is dry.  Neurological:     Mental Status: He is alert.    Filed Weights   03/02/21 1042 03/05/21 0534  Weight: 68 kg 67.2 kg     Intake/Output Summary (Last 24 hours) at 03/05/2021 0557 Last data filed at 03/05/2021 0500 Gross per 24 hour  Intake 677.25 ml  Output 2150 ml  Net -1472.75 ml   Net IO Since Admission: 1,511.49 mL [03/05/21 0557]  Pertinent Labs: CBC Latest Ref Rng & Units 03/05/2021 03/04/2021 03/03/2021  WBC 4.0 - 10.5 K/uL 10.4 10.9(H) 11.8(H)  Hemoglobin 13.0 - 17.0 g/dL 7.9(L) 8.0(L) 8.3(L)  Hematocrit 39.0 - 52.0 % 24.1(L) 23.3(L) 24.8(L)  Platelets  150 - 400 K/uL 102(L) 103(L) 105(L)    CMP Latest Ref Rng & Units 03/04/2021 03/03/2021 03/02/2021  Glucose 70 - 99 mg/dL 87 97 139(H)  BUN 8 - 23 mg/dL 91(H) 120(H) 124(H)  Creatinine 0.61 - 1.24 mg/dL 1.55(H) 1.65(H) 2.07(H)  Sodium 135 - 145 mmol/L 146(H) 142 140  Potassium 3.5 - 5.1 mmol/L 3.9 4.3 4.9  Chloride 98 - 111 mmol/L 122(H) 113(H) 101  CO2 22 - 32 mmol/L 20(L) 20(L) 26  Calcium 8.9 - 10.3 mg/dL 7.9(L) 7.7(L) 8.7(L)  Total Protein 6.5 - 8.1 g/dL - - 5.5(L)  Total Bilirubin 0.3 - 1.2 mg/dL - - 0.5  Alkaline Phos 38 - 126 U/L - - 45  AST 15 - 41 U/L - - 27  ALT 0 - 44 U/L - - 24    Imaging: No results found.  Assessment/Plan:   Principal Problem:   Symptomatic anemia Active Problems:   CAD (coronary artery disease)   Atrial flutter (HCC)   Dysphagia   Protein malnutrition ([place])   Patient Summary: Gregory [name] is a 88 y.o. male with a PMHx of aortic stenosis,  paroxysmal atrial fibrillation on Eliquis, atrioventricular complete heart block status post permanent pacemaker implantation, HLD, hypothyroidism, iron deficiency, and prior COVID infection in June 2022 who presents to the ED today with a CC of dizziness and near syncope, found to have symptomatic anemia likely secondary to GI bleed.   Symptomatic anemia 2/2 suspected upper GI bleed Hemoglobin stable at 7.9 this morning.  Family has spoken to palliative care team today and decided to continue with full scope of treatment.  Clinically, he is ready for discharge.  However he is high risk for rebleeding and family would like to monitor his hemoglobin outpatient.  His PCP is at [facility] and far away.  Family does not want to find a PCP locally. We also talked about the risk and benefit of holding Eliquis.  We explained that the risk of bleeding outweighs the risk of CVA from holding Eliquis.  Family verbalized understanding and states that they might resume Eliquis a few weeks after discharge to allow healing. -I will  reach out to case management, social worker and palliative team to help coordinate this. -Continue IV Protonix 40 mg BID and added Sucrafate -CBC in AM -Transfusion goal of greater than 7   Dysphagia s/p PEG Pt has a history of oropharyngeal dysphagia since July 2022. Got a PEG tube placed on 1/10.   -Appreciate RD recommendation -Will add extra free water flushes to prevent dehydration  History of a flutter S/p TAVR in 2020 Patient had a prosthetic valve placed in 2020 at [facility].  Also status post pacemaker due to complete heart block.   -Continue holding Eliquis   Hypothyroidism TSH was 3.2.  Patient is on Synthroid at home. -Continue Synthroid 75 mcg   Hyperlipidemia -Continue home atorvastatin   Diet: NPO IVF: Tube feed VTE: SCDs Code: Full PT/OT recs: None, none. TOC recs:   Dispo: Anticipated discharge to Home in 1 days pending [place] arrangement.   Gaylan Gerold, DO 03/05/2021, 5:57 AM Pager: [phone]  Please contact the on call pager after 5 pm and on weekends at [phone].

## 2021-03-06 LAB — GLUCOSE, CAPILLARY
Glucose-Capillary: 138 mg/dL — ABNORMAL HIGH (ref 70–99)
Glucose-Capillary: 100 mg/dL — ABNORMAL HIGH (ref 70–99)
Glucose-Capillary: 92 mg/dL (ref 70–99)
Glucose-Capillary: 97 mg/dL (ref 70–99)
Glucose-Capillary: 142 mg/dL — ABNORMAL HIGH (ref 70–99)
Glucose-Capillary: 131 mg/dL — ABNORMAL HIGH (ref 70–99)

## 2021-03-06 LAB — BASIC METABOLIC PANEL
Anion gap: 4 — ABNORMAL LOW (ref 5–15)
BUN: 41 mg/dL — ABNORMAL HIGH (ref 8–23)
CO2: 22 mmol/L (ref 22–32)
Calcium: 7.8 mg/dL — ABNORMAL LOW (ref 8.9–10.3)
Chloride: 120 mmol/L — ABNORMAL HIGH (ref 98–111)
Creatinine, Ser: 1.45 mg/dL — ABNORMAL HIGH (ref 0.61–1.24)
GFR, Estimated: 44 mL/min — ABNORMAL LOW (ref >60)
Glucose, Bld: 139 mg/dL — ABNORMAL HIGH (ref 70–99)
Potassium: 3.6 mmol/L (ref 3.5–5.1)
Sodium: 146 mmol/L — ABNORMAL HIGH (ref 135–145)

## 2021-03-06 LAB — PREPARE RBC (CROSSMATCH)

## 2021-03-06 LAB — CBC
HCT: 22 % — ABNORMAL LOW (ref 39.0–52.0)
Hemoglobin: 7.2 g/dL — ABNORMAL LOW (ref 13.0–17.0)
MCH: 31.7 pg (ref 26.0–34.0)
MCHC: 32.7 g/dL (ref 30.0–36.0)
MCV: 96.9 fL (ref 80.0–100.0)
Platelets: 94 10*3/uL — ABNORMAL LOW (ref 150–400)
RBC: 2.27 MIL/uL — ABNORMAL LOW (ref 4.22–5.81)
RDW: 19.3 % — ABNORMAL HIGH (ref 11.5–15.5)
WBC: 10.5 10*3/uL (ref 4.0–10.5)
nRBC: 0 % (ref 0.0–0.2)

## 2021-03-06 MED ORDER — SODIUM CHLORIDE 0.9% IV SOLUTION: Freq: Once | INTRAVENOUS | Status: DC

## 2021-03-06 MED ORDER — OSMOLITE 1.5 CAL PO LIQD
355.0000 mL | Freq: Four times a day (QID) | ORAL | Status: DC
  Administered 2021-03-06 – 2021-03-07 (×4): 355 mL
  Filled 2021-03-06 (×6): qty 474

## 2021-03-06 MED ORDER — SODIUM CHLORIDE 0.9% IV SOLUTION: Freq: Once | INTRAVENOUS | Status: AC

## 2021-03-06 NOTE — Progress Notes (Addendum)
Progress note:  After reviewing the patient's chart and assessing the patient at bedside, I met with patient's son.  He shared his concerns regarding getting a working and comfortable hospital bed with mattress at home.  Therapeutic silence and active listening provided for him to share his thoughts and emotions regarding his father's current health status.  Goals are clear and set - full code, full scope, d/c to home with [facility] RN/PT/OT with outpatient f/u for blood draws.  Palliative medicine team will continue to be available to the patient and his family throughout his hospitalization.  I will shadow the chart and await call from family or medical team for PMT to intervene.    Please reach out to PMT if patient status declines or at family's request.  Curt Bears L. Ilsa Iha, FNP-BC Palliative Medicine Team Team Phone # [phone]  NO CHARGE

## 2021-03-06 NOTE — Progress Notes (Cosign Needed)
° °  Durable Medical Equipment (From admission, onward)        Start     Ordered  03/06/21 1147  For home use only DME Hospital bed  Once      Question Answer Comment Length of Need Lifetime  Patient has (list medical condition): dysphagia w/ PEG placement, Pacemaker, Afib, deconditioned  The above medical condition requires: Patient requires the ability to reposition frequently  Head must be elevated greater than: 45 degrees  Bed type Semi-electric  Support Surface: Alternating Pressure Pad and Pump    03/06/21 1150   Poor skin turgor, failed gel overlay

## 2021-03-06 NOTE — Progress Notes (Signed)
Initial Nutrition Assessment  DOCUMENTATION CODES:   Severe malnutrition in context of chronic illness  INTERVENTION:  - Increase bolus tube feeding via PEG tube to 1.5 cartons of Osmolite 1.5 QID (1067 ml per day) Provides 1598 kcal, 67 gm protein, 815 ml free water daily  - Free water flushes of 200 ml with every bolus feeding  NUTRITION DIAGNOSIS:   Severe Malnutrition related to chronic illness (chronic dysphagia, afib) as evidenced by severe fat depletion, severe muscle depletion.  GOAL:   Patient will meet greater than or equal to 90% of their needs  MONITOR:   Labs, Weight trends  REASON FOR ASSESSMENT:   Consult Enteral/tube feeding initiation and management  ASSESSMENT:   Pt admitted with dizziness and near syncope secondary to symptomatic anemia, suspicion of upper GI bleed. PMH includes aortic stenosis, afib, atrioventricular complete heart block s/p PPM, HLD, hypothyroidism, iron deficiency, prior COVID in June 2022. Pt with history of dysphagia since June where he was admitted to [facility] s/p outpatient speech therapy. He then went to [facility] where he failed a MBS, had Cortrak placed and then had PEG tube placed 01/10 for long term feeding.  Spoke with pt's daughter at bedside regarding pt's nutrition and weight history. She reports that since having COVID in June, he had taste changes and began eating less. Since September he has had difficulty with dysphagia which led to him having a PEG placed and he has been NPO since. They currently are doing 1 can Osmolite 1.5 QID at home with free water flushes of 210 ml with every feed, in addition to 30 ml water flush before and after each medication. They have been feeding him between 10-7P as he does not want to get up any earlier than 10A to begin feeding and receiving his medication. Since he is on sucralfate, they have been timing his meals around this medication.   His family is trying to determine a new regimen  for tube feeding as they spend a lot of time administering tube feeds and medications. We discussed increasing his tube feeding regimen as 4 cans has not been meeting his needs. They are concerned this will increase the amount of tube feeds they will have to administer. We also discussed gravity feedings and how this may help decrease time spent doing bolus feeding. RD to follow up tomorrow to discuss this further.  Pt's daughter reports his usual body weight being around 155 lbs and a recent weight of 138 lbs. Per review of chart, noted pt with a 5% weight loss within the past year.   Medications: synthroid, protonix, sucralfate  Labs: sodium 146, BUN 91, Cr 1.55, CBG's 92-149 x 24 hours  NUTRITION - FOCUSED PHYSICAL EXAM:  Flowsheet Row Most Recent Value  Orbital Region Severe depletion  Upper Arm Region Severe depletion  Thoracic and Lumbar Region Severe depletion  Buccal Region Severe depletion  Temple Region Severe depletion  Clavicle Bone Region Severe depletion  Clavicle and Acromion Bone Region Severe depletion  Scapular Bone Region Severe depletion  Dorsal Hand Severe depletion  Patellar Region Severe depletion  Anterior Thigh Region Severe depletion  Posterior Calf Region Severe depletion  Edema (RD Assessment) Mild  [generalized]  Hair Reviewed  Eyes Reviewed  Mouth Reviewed  Skin Reviewed  Nails Reviewed       Diet Order:   Diet Order             Diet NPO time specified  Diet effective now  EDUCATION NEEDS:   No education needs have been identified at this time  Skin:  Skin Assessment: Skin Integrity Issues: Skin Integrity Issues:: Other (Comment), Stage I, DTI DTI: L heel Stage I: R heel, R & L foot Other: wound R&L toe, PI sacrum  Last BM:  1/19  Height:   Ht Readings from Last 1 Encounters:  03/02/21 5\' 8"  (1.727 m)    Weight:   Wt Readings from Last 1 Encounters:  03/05/21 67.2 kg    BMI:  Body mass index is 22.53  kg/m.  Estimated Nutritional Needs:   Kcal:  1650-1850  Protein:  85-100g  Fluid:  >/=1.65L  Clayborne Dana, RDN, LDN Clinical Nutrition

## 2021-03-06 NOTE — Care Management Important Message (Signed)
Important Message  Patient Details  Name: Gregory [name] MRN: [id number] Date of Birth: June 12, 1920   Medicare Important Message Given:  Yes     Shelda Altes 03/06/2021, 10:07 AM

## 2021-03-06 NOTE — Progress Notes (Signed)
HD#4 Subjective:  Overnight Events: none  Patient seen at bedside this AM with daughter present. States that he came to the hospital because we were thinking about doing a procedure for his throat. Able to state full name, month, and year. Able to recognize his daughter in the room. Pt states that he has not had a recent BM. No other complaints or concerns at this time.  Objective:  Vital signs in last 24 hours: Vitals:   03/05/21 1924 03/05/21 2341 03/06/21 0118 03/06/21 0350  BP: (!) 128/98 (!) 93/42 106/82 (!) 100/51  Pulse: 92 76 69 70  Resp: 20 (!) 21 20 20   Temp: 97.8 F (36.6 C) 98 F (36.7 C) (!) 97.3 F (36.3 C) (!) 97.4 F (36.3 C)  TempSrc: Oral Oral Oral Oral  SpO2: 95% 95% 100% 97%  Weight:      Height:       Supplemental O2: Room Air SpO2: 97 %   Physical Exam:  Physical Exam Constitutional:      General: He is not in acute distress.    Appearance: He is ill-appearing.  Eyes:     General:        Right eye: No discharge.        Left eye: No discharge.     Conjunctiva/sclera: Conjunctivae normal.  Cardiovascular:     Rate and Rhythm: Normal rate and regular rhythm.  Pulmonary:     Effort: Pulmonary effort is normal. No respiratory distress.  Abdominal:     Palpations: Abdomen is soft.     Comments: PEG tube in place.  Appears clean and noninfected  Skin:    General: Skin is dry.  Neurological:     Mental Status: He is alert.    Filed Weights   03/02/21 1042 03/05/21 0534  Weight: 68 kg 67.2 kg     Intake/Output Summary (Last 24 hours) at 03/06/2021 0631 Last data filed at 03/05/2021 2351 Gross per 24 hour  Intake 907 ml  Output 1450 ml  Net -543 ml    Net IO Since Admission: 2,468.49 mL [03/06/21 0631]  Pertinent Labs: CBC Latest Ref Rng & Units 03/06/2021 03/05/2021 03/04/2021  WBC 4.0 - 10.5 K/uL 10.5 10.4 10.9(H)  Hemoglobin 13.0 - 17.0 g/dL 7.2(L) 7.9(L) 8.0(L)  Hematocrit 39.0 - 52.0 % 22.0(L) 24.1(L) 23.3(L)  Platelets 150 -  400 K/uL 94(L) 102(L) 103(L)    CMP Latest Ref Rng & Units 03/04/2021 03/03/2021 03/02/2021  Glucose 70 - 99 mg/dL 87 97 139(H)  BUN 8 - 23 mg/dL 91(H) 120(H) 124(H)  Creatinine 0.61 - 1.24 mg/dL 1.55(H) 1.65(H) 2.07(H)  Sodium 135 - 145 mmol/L 146(H) 142 140  Potassium 3.5 - 5.1 mmol/L 3.9 4.3 4.9  Chloride 98 - 111 mmol/L 122(H) 113(H) 101  CO2 22 - 32 mmol/L 20(L) 20(L) 26  Calcium 8.9 - 10.3 mg/dL 7.9(L) 7.7(L) 8.7(L)  Total Protein 6.5 - 8.1 g/dL - - 5.5(L)  Total Bilirubin 0.3 - 1.2 mg/dL - - 0.5  Alkaline Phos 38 - 126 U/L - - 45  AST 15 - 41 U/L - - 27  ALT 0 - 44 U/L - - 24    Imaging: No results found.  Assessment/Plan:   Principal Problem:   Symptomatic anemia Active Problems:   CAD (coronary artery disease)   Atrial flutter (HCC)   Dysphagia   Protein malnutrition (HCC)   Gastrointestinal hemorrhage with melena   Patient Summary: Gregory [name] is a 88 y.o. male with a  PMHx of aortic stenosis, paroxysmal atrial fibrillation on Eliquis, atrioventricular complete heart block status post permanent pacemaker implantation, HLD, hypothyroidism, iron deficiency, and prior COVID infection in June 2022 who presents to the ED today with a CC of dizziness and near syncope, found to have symptomatic anemia likely secondary to GI bleed.  Symptomatic anemia 2/2 suspected upper GI bleed Hemoglobin at 7.2 this morning.  Family has spoken to palliative care team and decided to continue with full scope of treatment.  Clinically, he is ready for discharge.  However he is high risk for rebleeding and family would like to monitor his hemoglobin outpatient.  His PCP is at [facility] and far away.  Family does not want to find a PCP locally. Otherwise, will give 1 unit of PRBCs today and plan for discharge pending [place] arrangements. -I will reach out to case management, social worker and palliative team to help coordinate this. -Continue IV Protonix 40 mg BID and Sucrafate -CBC in AM -Transfusion  goal of greater than 7   Dysphagia s/p PEG Pt has a history of oropharyngeal dysphagia since July 2022. Got a PEG tube placed on 1/10.   -Appreciate RD recommendations -Continue free water flushes to prevent dehydration  History of a flutter S/p TAVR in 2020 Patient had a prosthetic valve placed in 2020 at [facility].  He is also status post pacemaker placement due to complete heart block. Previously discussed risks and benefits of holding Eliquis. Family verbalized understanding and state that they might resume Eliquis a few weeks after discharge to allow for healing. -Continue holding Eliquis  Hypothyroidism TSH was 3.2.  Patient is on Synthroid at home. -Continue Synthroid 75 mcg   Hyperlipidemia -Continue home atorvastatin   Diet: NPO IVF: Tube feeds VTE: SCDs Code: Full PT/OT recs: None, none. TOC recs:   Dispo: Anticipated discharge to Home in 1 days pending [place] arrangement.   Gregory Brill, MD 03/06/2021, 6:31 AM Pager: [phone]  Please contact the on call pager after 5 pm and on weekends at [phone].

## 2021-03-06 NOTE — TOC Progression Note (Addendum)
Transition of Care (TOC) - Progression Note  Marvetta Gibbons RN, BSN Transitions of Care Unit 4E- RN Case Manager See Treatment Team for direct phone #    Patient Details  Name: Gregory [name] MRN: [id number] Date of Birth: 04-Sep-1920  Transition of Care [facility]) CM/SW Contact  Dahlia Client Romeo Rabon, RN Phone Number: 03/06/2021, 12:42 PM  Clinical Narrative:    Spoke with son-Mike to follow up on hospital bed needs. Per [name] they are paying out of pocket for hospital bed with [facility]- they received bed on Jan. 16. Explained to [name] that insurance would cover cost for bed if [facility] order and patient mets medical need per Medicare guidelines. Pt has both Medicare and BCBS supplement plan. Discussed with [name] options for hospital bed including speaking with Hulan Fray, and also offering other DME agencies.  [name] has decided to have [facility] pick up the current bed and to have [facility] place DME order for bed to have [employer] process and deliver hospital bed under insurance to the home.  Have spoken with rounding team who are agreeable to place DME order, and call made to [facility] to process order for bed- once current bed is picked up and return ticket provided to [employer]- then [facility] can deliver their bed to the home hopefully by tomorrow.  Will also have air pump mattress.   HH orders have also been placed for resumption of HH needs- have spoken with Malachy Mood at [facility] [place] who will follow for discharge.    Expected Discharge Plan: [place] Barriers to Discharge: Continued Medical Work up  Expected Discharge Plan and Services Expected Discharge Plan: [place]   Discharge Planning Services: CM Consult Post Acute Care Choice: Durable Medical Equipment, Home Health Living arrangements for the past 2 months: Single Family Home                 DME Arranged: Hospital bed DME Agency: [employer] Date DME Agency Contacted: 03/06/21 Time DME Agency Contacted:  1208 Representative spoke with at DME Agency: [place]: RN, PT, OT [place] Agency: [place] Date [place]: 03/06/21 Time [place]: 1208 Representative spoke with at [place]: [place] ([place]) Interventions    Readmission Risk Interventions No flowsheet data found.

## 2021-03-07 DIAGNOSIS — D649 Anemia, unspecified: Secondary | ICD-10-CM | POA: Diagnosis not present

## 2021-03-07 DIAGNOSIS — D696 Thrombocytopenia, unspecified: Secondary | ICD-10-CM | POA: Diagnosis not present

## 2021-03-07 DIAGNOSIS — R1310 Dysphagia, unspecified: Secondary | ICD-10-CM | POA: Diagnosis not present

## 2021-03-07 DIAGNOSIS — Z931 Gastrostomy status: Secondary | ICD-10-CM | POA: Diagnosis not present

## 2021-03-07 DIAGNOSIS — L8990 Pressure ulcer of unspecified site, unspecified stage: Secondary | ICD-10-CM | POA: Insufficient documentation

## 2021-03-07 DIAGNOSIS — E43 Unspecified severe protein-calorie malnutrition: Secondary | ICD-10-CM | POA: Insufficient documentation

## 2021-03-07 LAB — BASIC METABOLIC PANEL
Anion gap: 6 (ref 5–15)
BUN: 40 mg/dL — ABNORMAL HIGH (ref 8–23)
CO2: 20 mmol/L — ABNORMAL LOW (ref 22–32)
Calcium: 7.7 mg/dL — ABNORMAL LOW (ref 8.9–10.3)
Chloride: 118 mmol/L — ABNORMAL HIGH (ref 98–111)
Creatinine, Ser: 1.47 mg/dL — ABNORMAL HIGH (ref 0.61–1.24)
GFR, Estimated: 43 mL/min — ABNORMAL LOW (ref >60)
Glucose, Bld: 132 mg/dL — ABNORMAL HIGH (ref 70–99)
Potassium: 4.1 mmol/L (ref 3.5–5.1)
Sodium: 144 mmol/L (ref 135–145)

## 2021-03-07 LAB — CBC
HCT: 24.8 % — ABNORMAL LOW (ref 39.0–52.0)
Hemoglobin: 8 g/dL — ABNORMAL LOW (ref 13.0–17.0)
MCH: 31.6 pg (ref 26.0–34.0)
MCHC: 32.3 g/dL (ref 30.0–36.0)
MCV: 98 fL (ref 80.0–100.0)
Platelets: 88 10*3/uL — ABNORMAL LOW (ref 150–400)
RBC: 2.53 MIL/uL — ABNORMAL LOW (ref 4.22–5.81)
RDW: 18.6 % — ABNORMAL HIGH (ref 11.5–15.5)
WBC: 8.9 10*3/uL (ref 4.0–10.5)
nRBC: 0.2 % (ref 0.0–0.2)

## 2021-03-07 LAB — GLUCOSE, CAPILLARY
Glucose-Capillary: 101 mg/dL — ABNORMAL HIGH (ref 70–99)
Glucose-Capillary: 110 mg/dL — ABNORMAL HIGH (ref 70–99)
Glucose-Capillary: 135 mg/dL — ABNORMAL HIGH (ref 70–99)
Glucose-Capillary: 138 mg/dL — ABNORMAL HIGH (ref 70–99)
Glucose-Capillary: 92 mg/dL (ref 70–99)
Glucose-Capillary: 98 mg/dL (ref 70–99)

## 2021-03-07 LAB — PATHOLOGIST SMEAR REVIEW

## 2021-03-07 MED ORDER — OSMOLITE 1.5 CAL PO LIQD
474.0000 mL | Freq: Every day | ORAL | Status: DC
  Administered 2021-03-08 – 2021-03-10 (×3): 474 mL
  Filled 2021-03-07 (×6): qty 474
  Filled 2021-03-07: qty 1000

## 2021-03-07 MED ORDER — OSMOLITE 1.5 CAL PO LIQD
355.0000 mL | Freq: Two times a day (BID) | ORAL | Status: DC
  Administered 2021-03-07 – 2021-03-08 (×3): 355 mL
  Administered 2021-03-09: 350 mL
  Administered 2021-03-09 – 2021-03-10 (×2): 355 mL
  Filled 2021-03-07: qty 474
  Filled 2021-03-07: qty 1000
  Filled 2021-03-07 (×3): qty 474
  Filled 2021-03-07: qty 1000
  Filled 2021-03-07 (×3): qty 474
  Filled 2021-03-07: qty 1000

## 2021-03-07 NOTE — TOC Progression Note (Signed)
Transition of Care [facility]) - Progression Note    Patient Details  Name: Gregory [name] MRN: [id number] Date of Birth: [birth year]  Transition of Care [facility]) CM/SW [place], [place] Phone Number: 03/07/2021, 2:57 PM  Clinical Narrative:     Informed bu RNCM Krisit, family is considering rehab at skilled nursing rehab- requested to send out referrals for SNF placement.   Referrals has been sent- will provide bed offers once available.   TOC will continue to follow and assist with discharge planning.  Thurmond Butts, MSW, LCSW Clinical Social Worker    Expected Discharge Plan: [place] Barriers to Discharge: Continued Medical Work up  Expected Discharge Plan and Services Expected Discharge Plan: [place]   Discharge Planning Services: CM Consult Post Acute Care Choice: Durable Medical Equipment, Home Health Living arrangements for the past 2 months: Single Family Home                 DME Arranged: Hospital bed DME Agency: [employer] Date DME Agency Contacted: 03/06/21 Time DME Agency Contacted: 1208 Representative spoke with at DME Agency: [place]: RN, PT, OT [place] Agency: [place] Date [place]: 03/06/21 Time [place]: 1208 Representative spoke with at [place]: [place] ([place]) Interventions    Readmission Risk Interventions No flowsheet data found.

## 2021-03-07 NOTE — Plan of Care (Signed)
Problem: Clinical Measurements: Goal: Ability to maintain clinical measurements within normal limits will improve Outcome: Progressing Goal: Will remain free from infection Outcome: Progressing Goal: Respiratory complications will improve Outcome: Progressing   Problem: Activity: Goal: Risk for activity intolerance will decrease Outcome: Progressing   

## 2021-03-07 NOTE — Progress Notes (Signed)
Nutrition Follow-up  DOCUMENTATION CODES:   Severe malnutrition in context of chronic illness  INTERVENTION:   Change bolus TF regimen via PEG:  10 AM: Osmolite 1.5 474 ml (2 cartons); 105 ml free water flush before and after 4 PM: Osmolite 1.5 355 ml (1.5 cartons); 120 ml free water flush before and after 7 PM: Osmolite 1.5 355 ml (1.5 cartons); 120 ml free water flush before and after  Total intake: 1775 kcal, 75 gm protein, 1595 ml free water daily.  Free water flushes 60 ml QID with medications for a total of 1835 ml free water per day.   NUTRITION DIAGNOSIS:   Severe Malnutrition related to chronic illness (chronic dysphagia, afib) as evidenced by severe fat depletion, severe muscle depletion.  Ongoing  GOAL:   Patient will meet greater than or equal to 90% of their needs  Met with TF  MONITOR:   Labs, Weight trends  REASON FOR ASSESSMENT:   Consult Enteral/tube feeding initiation and management  ASSESSMENT:   Pt admitted with dizziness and near syncope secondary to symptomatic anemia, suspicion of upper GI bleed. PMH includes aortic stenosis, afib, atrioventricular complete heart block s/p PPM, HLD, hypothyroidism, iron deficiency, prior COVID in June 2022. Pt with history of dysphagia since June where he was admitted to [facility] s/p outpatient speech therapy. He then went to [facility] in [facility] where he failed a MBS, had Cortrak placed and then had PEG tube placed 01/10 for long term feeding.  Spoke with patient and his daughter in room. Daughter would like to simplify his feeding regimen so he has more free time during the day. Typical regimen at home is 1 carton (237 ml) Osmolite 1.5 QID with 105 ml free water flushes before and after each feeding. She gives feedings at 10 AM, 1 PM, 4 PM, and 7 PM. She also provides 60 ml water flush with medications QID. We discussed changing to 2 cartons once daily and 1.5 cartons twice daily to provide more calories and protein in 3  feedings instead of 4 feedings. Daughter and patient agreed to give this new TF regimen a try. RD to change TF orders. Discussed with RN.   Labs reviewed.  CBG: [id number]  Medications reviewed and include Protonix, Carafate.  Admission weight 68 kg Current weight 71.7 kg Increase in weight r/t rehydration.  Diet Order:   Diet Order             Diet NPO time specified  Diet effective now                   EDUCATION NEEDS:   No education needs have been identified at this time  Skin:  Skin Assessment: Skin Integrity Issues: Skin Integrity Issues:: Other (Comment), Stage I, DTI DTI: L heel Stage I: R heel, R & L foot Other: wound R&L toe, PI sacrum  Last BM:  1/19  Height:   Ht Readings from Last 1 Encounters:  03/02/21 '5\' 8"'  (1.727 m)    Weight:   Wt Readings from Last 1 Encounters:  03/07/21 71.7 kg    BMI:  Body mass index is 24.02 kg/m.  Estimated Nutritional Needs:   Kcal:  1650-1850  Protein:  85-100g  Fluid:  >/=1.65L    Lucas Mallow RD, LDN, CNSC Please refer to Amion for contact information.

## 2021-03-07 NOTE — NC FL2 (Signed)
[place] LEVEL OF CARE SCREENING TOOL     IDENTIFICATION  Patient Name: Gregory [name] Birthdate: 1920/11/29 Sex: male Admission Date (Current Location): 03/02/2021  [facility] and [place] Number:  Herbalist and Address:  [facility] [place]. [facility], [place] [address] [place], [place] [address]      Provider Number: [id number]  Attending Physician Name and Address:  Charise Killian, MD  Relative Name and Phone Number:       Current Level of Care: Hospital Recommended Level of Care: [place] Prior Approval Number:    Date Approved/Denied:   PASRR Number: [id number]  Discharge Plan: SNF    Current Diagnoses: Patient Active Problem List   Diagnosis Date Noted   Protein-calorie malnutrition, severe 03/07/2021   Pressure injury of skin 03/07/2021   Gastrointestinal hemorrhage with melena    Symptomatic anemia 03/02/2021   Atrial flutter ([place]) 03/02/2021   Dysphagia 03/02/2021   Protein malnutrition ([place]) 03/02/2021   Chronic anticoagulation 02/01/2020   CAD (coronary artery disease) 02/01/2020   CRI (chronic renal insufficiency), stage 3 (moderate) ([place]) 02/01/2020   Dizziness 02/01/2020   S/P TAVR (transcatheter aortic valve replacement) 10/15/2014   Bradycardia 09/08/2013   Pacemaker 09/08/2013   Pre-operative cardiovascular examination 09/08/2013   Inguinal hernia without mention of obstruction or gangrene, unilateral or unspecified, (not specified as recurrent)-left 08/11/2013   Chronic atrial fibrillation ([place]) 08/11/2013    Orientation RESPIRATION BLADDER Height & Weight     Self, Time, Situation, Place  Normal Incontinent Weight: 158 lb (71.7 kg) Height:  5\' 8"  (172.7 cm)  BEHAVIORAL SYMPTOMS/MOOD NEUROLOGICAL BOWEL NUTRITION STATUS      Continent Diet (PEG tube-see discharge summary)  AMBULATORY STATUS COMMUNICATION OF NEEDS Skin   Limited Assist   Surgical wounds (Pressure Injury, Heel RT Stage , Heel Lft Deep  Pressure Injury, RT Foot; Lateral Stage , LFT Food Lateral, Stage 1, Wound/Incision RT, LFT Toe Bilt Tips of Great toes w/ dry eschar)                       Personal Care Assistance Level of Assistance  Bathing, Feeding, Dressing Bathing Assistance: Maximum assistance Feeding assistance: Limited assistance Dressing Assistance: Maximum assistance     Functional Limitations Info  Sight, Hearing, Speech Sight Info: Adequate Hearing Info: Impaired Speech Info: Adequate    SPECIAL CARE FACTORS FREQUENCY  PT (By licensed PT), OT (By licensed OT)     PT Frequency: 5x per week OT Frequency: 5X per week            Contractures Contractures Info: Not present    Additional Factors Info  Code Status, Allergies Code Status Info: FULL Code Allergies Info: NKA           Current Medications (03/07/2021):  This is the current hospital active medication list Current Facility-Administered Medications  Medication Dose Route Frequency Provider Last Rate Last Admin   0.9 %  sodium chloride infusion (Manually program via Guardrails IV Fluids)   Intravenous Once Orvis Brill, MD   Stopped at 03/06/21 2233   atorvastatin (LIPITOR) tablet 40 mg  40 mg Per Tube QHS Gaylan Gerold, DO   40 mg at 03/06/21 2236   feeding supplement (OSMOLITE 1.5 CAL) liquid 355 mL  355 mL Per Tube QID Charise Killian, MD   355 mL at 03/06/21 2246   free water 200 mL  200 mL Per Tube Q6H Joni Reining C, DO   200  mL at 03/07/21 0534   levothyroxine (SYNTHROID) tablet 75 mcg  75 mcg Per Tube Q0600 Gaylan Gerold, DO   75 mcg at 03/07/21 0525   pantoprazole (PROTONIX) injection 40 mg  40 mg Intravenous Q12H Gaylan Gerold, DO   40 mg at 03/06/21 2237   sucralfate (CARAFATE) 1 GM/10ML suspension 1 g  1 g Per Tube Q8H Gaylan Gerold, DO   1 g at 03/07/21 8118     Discharge Medications: Please see discharge summary for a list of discharge medications.  Relevant Imaging Results:  Relevant Lab  Results:   Additional Information SSN [id number]  Vinie Sill, LCSW

## 2021-03-07 NOTE — Evaluation (Signed)
Occupational Therapy Evaluation Patient Details Name: Gregory [name] MRN: [id number] DOB: December 08, 1920 Today's Date: 03/07/2021   History of Present Illness 88 yo male presenting to ED on 1/19 with dizziness and near syncope. Admitted for anemia secondary to GI bleed. Recent admission to [facility] in [place] and had PEG placement on 12/20. PMH including a flutter, cancer, cateracts, HTN, pacemaker, COVID (June 2022)and neuromuscular disorder.   Clinical Impression   PTA, pt was living with his son and was independent with BADLs and using SPC until hospitalization in December 2022. Currently, pt requires Min A for UB ADLs, Max A for LB ADLs, and Mod-Max A for functional transfers using RW. Pt presenting with decreased balance, strength, and activity tolerance. However, pt highly motivated to participate in therapy despite fatigue and wants to return to his norma functional level. Pt also with very supportive family and very eager for him to start rehab. Pt would benefit from further acute OT to facilitate safe dc. Recommend dc to AIR for further OT to optimize safety, independence with ADLs, and return to PLOF.      Recommendations for follow up therapy are one component of a multi-disciplinary discharge planning process, led by the attending physician.  Recommendations may be updated based on patient status, additional functional criteria and insurance authorization.   Follow Up Recommendations  Acute inpatient rehab (3hours/day)    Assistance Recommended at Discharge Frequent or constant Supervision/Assistance  Patient can return home with the following Two people to help with walking and/or transfers;A lot of help with bathing/dressing/bathroom;Assistance with cooking/housework    Functional Status Assessment  Patient has had a recent decline in their functional status and demonstrates the ability to make significant improvements in function in a reasonable and predictable amount of time.   Equipment Recommendations  Other (comment) (Defer to next venue)    Recommendations for Other Services PT consult;Rehab consult     Precautions / Restrictions Precautions Precautions: Fall;Other (comment) Precaution Comments: peg tube Restrictions Weight Bearing Restrictions: No      Mobility Bed Mobility Overal bed mobility: Needs Assistance Bed Mobility: Supine to Sit     Supine to sit: Mod assist, +2 for safety/equipment, HOB elevated     General bed mobility comments: Mod A for elevating trunk    Transfers Overall transfer level: Needs assistance Equipment used: Rolling walker (2 wheels) Transfers: Sit to/from Stand, Bed to chair/wheelchair/BSC Sit to Stand: Mod assist, Max assist, +2 physical assistance, +2 safety/equipment Stand pivot transfers: +2 safety/equipment, +2 physical assistance, Mod assist         General transfer comment: Mod-Max A for power up from EOB. Assistance to manage RW and pivot to recliner. Performing second sit<>stand with Mod A +2 and stabilizing RW. posterior lean      Balance Overall balance assessment: Needs assistance Sitting-balance support: No upper extremity supported, Feet supported Sitting balance-Leahy Scale: Fair     Standing balance support: Bilateral upper extremity supported, During functional activity Standing balance-Leahy Scale: Poor Standing balance comment: posterior lean                           ADL either performed or assessed with clinical judgement   ADL Overall ADL's : Needs assistance/impaired Eating/Feeding: NPO   Grooming: Supervision/safety;Set up;Sitting   Upper Body Bathing: Minimal assistance;Sitting   Lower Body Bathing: Maximal assistance;Sit to/from stand   Upper Body Dressing : Minimal assistance;Sitting   Lower Body Dressing: Maximal assistance;Sit to/from stand  Toilet Transfer: Moderate assistance;Maximal assistance;+2 for physical assistance;+2 for  safety/equipment;Stand-pivot;Rolling walker (2 wheels) (simulated to recliner) Toilet Transfer Details (indicate cue type and reason): Mod-Max A for power up from EOB. Assistance to manage RW and pivot to recliner. Performing second sit<>stand with Mod A +2 and stabilizing RW. posterior lean         Functional mobility during ADLs: Minimal assistance;Moderate assistance;Rolling walker (2 wheels);+2 for physical assistance;+2 for safety/equipment General ADL Comments: Presenting with decreased balance and strength. Very motivated     Vision Baseline Vision/History: 1 Wears glasses       Perception     Praxis      Pertinent Vitals/Pain       Hand Dominance     Extremity/Trunk Assessment Upper Extremity Assessment Upper Extremity Assessment: Generalized weakness   Lower Extremity Assessment Lower Extremity Assessment: Generalized weakness   Cervical / Trunk Assessment Cervical / Trunk Assessment: Kyphotic   Communication Communication Communication: HOH;Other (comment) (soft spoken)   Cognition Arousal/Alertness: Awake/alert Behavior During Therapy: WFL for tasks assessed/performed Overall Cognitive Status: Within Functional Limits for tasks assessed                                       General Comments  BP supine 126/60, sitting in recliner after stand pivot 118/57    Exercises     Shoulder Instructions      Home Living Family/patient expects to be discharged to:: Private residence Living Arrangements: Children (son) Available Help at Discharge: Family Type of Home: House       Home Layout: Two level;Able to live on main level with bedroom/bathroom;1/2 bath on main level;Bed/bath upstairs   Alternate Level Stairs-Rails:  (stair lift to upstairs; but now lives on main level) Bathroom Shower/Tub: Walk-in shower         Home Equipment: Radio producer - single point;Grab bars - tub/shower   Additional Comments: Son lives with pt; daughter nearby  and does majority of household tasks      Prior Functioning/Environment                 ADLs Comments: Does bird bathing on first floor; but handicapped accessible bathroom is upstairs        OT Problem List: Decreased strength;Decreased range of motion;Decreased activity tolerance;Impaired balance (sitting and/or standing);Decreased knowledge of use of DME or AE;Decreased knowledge of precautions      OT Treatment/Interventions: Self-care/ADL training;Therapeutic exercise;Energy conservation;DME and/or AE instruction;Therapeutic activities;Patient/family education    OT Goals(Current goals can be found in the care plan section) Acute Rehab OT Goals Patient Stated Goal: Go home OT Goal Formulation: With patient/family Time For Goal Achievement: 03/21/21 Potential to Achieve Goals: Good  OT Frequency: Min 2X/week    Co-evaluation PT/OT/SLP Co-Evaluation/Treatment: Yes Reason for Co-Treatment: For patient/therapist safety;To address functional/ADL transfers   OT goals addressed during session: ADL's and self-care      AM-PAC OT "6 Clicks" Daily Activity     Outcome Measure Help from another person eating meals?: Total (PEG) Help from another person taking care of personal grooming?: A Little Help from another person toileting, which includes using toliet, bedpan, or urinal?: A Lot Help from another person bathing (including washing, rinsing, drying)?: A Lot Help from another person to put on and taking off regular upper body clothing?: A Little Help from another person to put on and taking off regular lower body clothing?: A Lot 6  Click Score: 13   End of Session Equipment Utilized During Treatment: Gait belt;Rolling walker (2 wheels) Nurse Communication: Mobility status  Activity Tolerance: Patient tolerated treatment well Patient left: in chair;with call bell/phone within reach;with family/visitor present  OT Visit Diagnosis: Unsteadiness on feet (R26.81);Other  abnormalities of gait and mobility (R26.89);Muscle weakness (generalized) (M62.81)                Time: 5498-2641 OT Time Calculation (min): 29 min Charges:  OT General Charges $OT Visit: 1 Visit OT Evaluation $OT Eval Moderate Complexity: [place], OTR/L Acute Rehab Pager: [phone] Office: [place] 03/07/2021, 1:49 PM

## 2021-03-07 NOTE — Progress Notes (Signed)
HD#5 Subjective:  Overnight Events: None  Patient was seen at bedside this AM with daughter present. The patient does not endorse any complaints or concerns today. His daughter states that family had time to discuss this further and that the patient is now amenable to a rehab facility rather than home health with outpatient Hgb checks. No other complaints or concerns at this time.  Objective:  Vital signs in last 24 hours: Vitals:   03/06/21 2002 03/06/21 2044 03/07/21 0001 03/07/21 0406  BP: (!) 122/54 (!) 120/47 (!) 110/54 (!) 115/52  Pulse: 69 70 78 71  Resp: (!) 21 20    Temp: 97.9 F (36.6 C) 97.9 F (36.6 C) 98 F (36.7 C) (!) 97.3 F (36.3 C)  TempSrc:  Oral Oral Oral  SpO2:  95% 95% 94%  Weight:    71.7 kg  Height:       Supplemental O2: Room Air SpO2: 94 %   Physical Exam:  Physical Exam Constitutional:      General: He is not in acute distress.    Appearance: He is ill-appearing.  Eyes:     General:        Right eye: No discharge.        Left eye: No discharge.     Conjunctiva/sclera: Conjunctivae normal.  Cardiovascular:     Rate and Rhythm: Normal rate and regular rhythm.  Pulmonary:     Effort: Pulmonary effort is normal. No respiratory distress.  Abdominal:     Palpations: Abdomen is soft.     Comments: PEG tube in place.  Appears clean and noninfected  Skin:    General: Skin is dry.  Neurological:     Mental Status: He is alert.    Filed Weights   03/02/21 1042 03/05/21 0534 03/07/21 0406  Weight: 68 kg 67.2 kg 71.7 kg     Intake/Output Summary (Last 24 hours) at 03/07/2021 0644 Last data filed at 03/07/2021 0541 Gross per 24 hour  Intake 359.33 ml  Output 1800 ml  Net -1440.67 ml    Net IO Since Admission: 1,027.82 mL [03/07/21 0644]  Pertinent Labs: CBC Latest Ref Rng & Units 03/07/2021 03/06/2021 03/05/2021  WBC 4.0 - 10.5 K/uL 8.9 10.5 10.4  Hemoglobin 13.0 - 17.0 g/dL 8.0(L) 7.2(L) 7.9(L)  Hematocrit 39.0 - 52.0 % 24.8(L)  22.0(L) 24.1(L)  Platelets 150 - 400 K/uL 88(L) 94(L) 102(L)    CMP Latest Ref Rng & Units 03/07/2021 03/06/2021 03/04/2021  Glucose 70 - 99 mg/dL 132(H) 139(H) 87  BUN 8 - 23 mg/dL 40(H) 41(H) 91(H)  Creatinine 0.61 - 1.24 mg/dL 1.47(H) 1.45(H) 1.55(H)  Sodium 135 - 145 mmol/L 144 146(H) 146(H)  Potassium 3.5 - 5.1 mmol/L 4.1 3.6 3.9  Chloride 98 - 111 mmol/L 118(H) 120(H) 122(H)  CO2 22 - 32 mmol/L 20(L) 22 20(L)  Calcium 8.9 - 10.3 mg/dL 7.7(L) 7.8(L) 7.9(L)  Total Protein 6.5 - 8.1 g/dL - - -  Total Bilirubin 0.3 - 1.2 mg/dL - - -  Alkaline Phos 38 - 126 U/L - - -  AST 15 - 41 U/L - - -  ALT 0 - 44 U/L - - -    Imaging: No results found.  Assessment/Plan:   Principal Problem:   Symptomatic anemia Active Problems:   CAD (coronary artery disease)   Atrial flutter (HCC)   Dysphagia   Protein malnutrition (HCC)   Gastrointestinal hemorrhage with melena   Protein-calorie malnutrition, severe   Pressure injury of skin  Patient Summary: Gregory [name] is a 88 y.o. male with a PMHx of aortic stenosis, paroxysmal atrial fibrillation on Eliquis, atrioventricular complete heart block status post permanent pacemaker implantation, HLD, hypothyroidism, iron deficiency, and prior COVID infection in June 2022 who presents to the ED today with a CC of dizziness and near syncope, found to have symptomatic anemia likely secondary to GI bleed.  Symptomatic anemia 2/2 suspected upper GI bleed Hemoglobin at 8.0 this morning after receiving one unit of blood yesterday.  Family has spoken to palliative care team and decided to continue with full scope of treatment. Clinically, he is ready for discharge. However he is high risk for rebleeding. Family originally planned on [facility] with outpatient Hgb checks, but patient now wishes to pursue a rehab facility after discharge. Will place orders for PT/OT eval today to determine his options at this point. -Continue IV Protonix 40 mg BID and Sucrafate -CBC  in AM -Transfusion goal of greater than 7 -PT/OT eval  Thrombocytopenia Review of records shows mild thrombocytopenia in the low 100s in 2021. Platelets have been downtrending, now at 106 today. Likely 2/2 poor nutrition but will obtain peripheral smear at this time for further evaluation. -Peripheral smear pending   Dysphagia s/p PEG Pt has a history of oropharyngeal dysphagia since July 2022. Got a PEG tube placed on 1/10.   -Appreciate RD recommendations -Continue free water flushes to prevent dehydration  History of a flutter S/p TAVR in 2020 Patient had a prosthetic valve placed in 2020 at [facility].  He is also status post pacemaker placement due to complete heart block. Previously discussed risks and benefits of holding Eliquis. Family verbalized understanding and state that they might resume Eliquis a few weeks after discharge to allow for healing. -Continue holding Eliquis  Hypothyroidism TSH was 3.2.  Patient is on Synthroid at home. -Continue Synthroid 75 mcg   Hyperlipidemia -Continue home atorvastatin   Diet: NPO IVF: Tube feeds VTE: SCDs Code: Full PT/OT recs: Pending  Dispo: Anticipated discharge pending PT/OT evals  Orvis Brill, MD 03/07/2021, 6:44 AM Pager: [phone]  Please contact the on call pager after 5 pm and on weekends at [phone].

## 2021-03-07 NOTE — Plan of Care (Signed)
°  Problem: Health Behavior/Discharge Planning: Goal: Ability to manage health-related needs will improve Outcome: Progressing   Problem: Clinical Measurements: Goal: Ability to maintain clinical measurements within normal limits will improve Outcome: Progressing Goal: Will remain free from infection Outcome: Progressing   Problem: Activity: Goal: Risk for activity intolerance will decrease Outcome: Progressing   Problem: Nutrition: Goal: Adequate nutrition will be maintained Outcome: Progressing   

## 2021-03-07 NOTE — Evaluation (Signed)
Physical Therapy Evaluation Patient Details Name: Teddrick Mallari MRN: [id number] DOB: 1921-02-08 Today's Date: 03/07/2021  History of Present Illness  Pt is a 88 y.o. male admitted 03/02/21 with dizziness and near-sycnope; workup for anemia secondary to GIB. Of note, pt with h/o oropharyngeal dysphagia s/p PEG placement 7/34/19 (??-conflicting dates in chart) at [facility]. PMH includes aflutter, HTN, pacemaker, CA, neuromuscular disorder, cataracts, COVID (07/2020).   Clinical Impression  Pt presents with an overall decrease in functional mobility secondary to above. PTA, pt mod indep with mobility and ADLs using SPC, lives with son; children assist with iADLs as needed, but pt values his independence. Today, pt requiring up to mod-maxA to initiate transfer and gait training with RW (+2 safety); pt demonstrates impressive strength reserves with this being his first time OOB since admission 03/02/21. Pt highly motivated, has great family support. Pt is an excellent candidate for intensive AIR-level therapies to maximize functional mobility and independence prior to return home. Will follow acutely to address established goals.   Recommendations for follow up therapy are one component of a multi-disciplinary discharge planning process, led by the attending physician.  Recommendations may be updated based on patient status, additional functional criteria and insurance authorization.  Follow Up Recommendations Acute inpatient rehab (3hours/day)    Assistance Recommended at Discharge Frequent or constant Supervision/Assistance  Patient can return home with the following  A lot of help with walking and/or transfers;A lot of help with bathing/dressing/bathroom;Assistance with cooking/housework;Assist for transportation;Help with stairs or ramp for entrance    Equipment Recommendations  (TBD)  Recommendations for Other Services       Functional Status Assessment Patient has had a recent decline  in their functional status and demonstrates the ability to make significant improvements in function in a reasonable and predictable amount of time.     Precautions / Restrictions Precautions Precautions: Fall;Other (comment) Precaution Comments: peg tube Restrictions Weight Bearing Restrictions: No      Mobility  Bed Mobility Overal bed mobility: Needs Assistance Bed Mobility: Supine to Sit     Supine to sit: Mod assist, +2 for safety/equipment, HOB elevated     General bed mobility comments: good initiation of BLE movement to EOB, modA for trunk elevation; increased time and effort scooting hips to EOB with min guard    Transfers Overall transfer level: Needs assistance Equipment used: Rolling walker (2 wheels) Transfers: Sit to/from Stand, Bed to chair/wheelchair/BSC Sit to Stand: Mod assist, Max assist, +2 physical assistance, +2 safety/equipment   Step pivot transfers: Mod assist, +2 physical assistance, +2 safety/equipment       General transfer comment: Initial mod-maxA for trunk elevation and stabilizing RW as pt pulling up on RW with BUEs and attempting to stand straight up with posterior lean; additional trial from recliner with improved BLE placement and anterior weight translation with min-modA to stand; verbal cues for posterior lean; pivotal steps from bed to recliner with modA+2 safety    Ambulation/Gait                  Stairs            Wheelchair Mobility    Modified Rankin (Stroke Patients Only)       Balance Overall balance assessment: Needs assistance Sitting-balance support: No upper extremity supported, Feet supported Sitting balance-Leahy Scale: Fair     Standing balance support: Bilateral upper extremity supported, During functional activity Standing balance-Leahy Scale: Poor Standing balance comment: posterior lean  Pertinent Vitals/Pain Pain Assessment Pain Assessment:  Faces Faces Pain Scale: No hurt Pain Intervention(s): Monitored during session    Home Living Family/patient expects to be discharged to:: Private residence Living Arrangements: Children (son) Available Help at Discharge: Family Type of Home: House         Home Layout: Two level;Able to live on main level with bedroom/bathroom;1/2 bath on main level;Bed/bath upstairs Home Equipment: Cane - single point;Grab bars - tub/shower Additional Comments: Son lives with pt; daughter nearby and does majority of household tasks    Prior Function                 ADLs Comments: Does bird bathing on first floor; but handicapped accessible bathroom is upstairs     Journalist, newspaper        Extremity/Trunk Assessment   Upper Extremity Assessment Upper Extremity Assessment: Generalized weakness    Lower Extremity Assessment Lower Extremity Assessment: Generalized weakness    Cervical / Trunk Assessment Cervical / Trunk Assessment: Kyphotic  Communication   Communication: HOH;Other (comment) (soft spoken)  Cognition Arousal/Alertness: Awake/alert Behavior During Therapy: WFL for tasks assessed/performed Overall Cognitive Status: Within Functional Limits for tasks assessed                                 General Comments: WFL for simple tasks, not formally assessed; pt HOH        General Comments General comments (skin integrity, edema, etc.): BP sitting EOB 126/60, BP post-standing activity 118/57; SpO2 96% on RA via portable pulse ox. Pt's daughter [name]) present and supportive - discussed recommendations for post-acute rehab, pt and daughter in agreement    Exercises     Assessment/Plan    PT Assessment Patient needs continued PT services  PT Problem List Decreased strength;Decreased activity tolerance;Decreased balance;Decreased mobility;Decreased knowledge of use of DME;Cardiopulmonary status limiting activity       PT Treatment Interventions DME  instruction;Gait training;Stair training;Functional mobility training;Therapeutic activities;Therapeutic exercise;Balance training;Patient/family education    PT Goals (Current goals can be found in the Care Plan section)  Acute Rehab PT Goals Patient Stated Goal: Regain independence, return home PT Goal Formulation: With patient/family Time For Goal Achievement: 03/21/21 Potential to Achieve Goals: Good    Frequency Min 3X/week     Co-evaluation PT/OT/SLP Co-Evaluation/Treatment: Yes Reason for Co-Treatment: For patient/therapist safety;To address functional/ADL transfers PT goals addressed during session: Mobility/safety with mobility;Balance;Proper use of DME OT goals addressed during session: ADL's and self-care       AM-PAC PT "6 Clicks" Mobility  Outcome Measure Help needed turning from your back to your side while in a flat bed without using bedrails?: A Lot Help needed moving from lying on your back to sitting on the side of a flat bed without using bedrails?: A Lot Help needed moving to and from a bed to a chair (including a wheelchair)?: A Lot Help needed standing up from a chair using your arms (e.g., wheelchair or bedside chair)?: A Lot Help needed to walk in hospital room?: Total Help needed climbing 3-5 steps with a railing? : Total 6 Click Score: 10    End of Session Equipment Utilized During Treatment: Gait belt Activity Tolerance: Patient tolerated treatment well Patient left: in chair;with call bell/phone within reach;with chair alarm set;with family/visitor present Nurse Communication: Mobility status PT Visit Diagnosis: Other abnormalities of gait and mobility (R26.89);Muscle weakness (generalized) (M62.81)    Time: 8299-3716 PT Time  Calculation (min) (ACUTE ONLY): 27 min   Charges:   PT Evaluation $PT Eval Moderate Complexity: [place], PT, DPT Acute Rehabilitation Services  Pager [phone] Office [place] 03/07/2021, 3:55 PM

## 2021-03-07 NOTE — Progress Notes (Signed)
Inpatient Rehab Admissions Coordinator:   Per therapy recommendations pt was screened for CIR by Shann Medal, PT, DPT.  Pt appears to meet criteria for CIR admission, and we can certainly evaluate further if pt and family wish.  TOC note states they may be interested in SNF placement.  If open to AIR level therapy here at [facility], please feel free to place an IP Rehab MD consult order and one of our admissions coordinators will follow up for full assessment.   Shann Medal, PT, DPT Admissions Coordinator [phone] 03/07/21  4:36 PM

## 2021-03-08 DIAGNOSIS — D649 Anemia, unspecified: Secondary | ICD-10-CM | POA: Diagnosis not present

## 2021-03-08 DIAGNOSIS — R1310 Dysphagia, unspecified: Secondary | ICD-10-CM | POA: Diagnosis not present

## 2021-03-08 DIAGNOSIS — D696 Thrombocytopenia, unspecified: Secondary | ICD-10-CM | POA: Diagnosis not present

## 2021-03-08 DIAGNOSIS — Z931 Gastrostomy status: Secondary | ICD-10-CM | POA: Diagnosis not present

## 2021-03-08 LAB — BPAM RBC
Blood Product Expiration Date: 202301302359
Blood Product Expiration Date: 202302222359
ISSUE DATE / TIME: 202301231729
ISSUE DATE / TIME: 202301242010
Unit Type and Rh: 600
Unit Type and Rh: 6200

## 2021-03-08 LAB — TYPE AND SCREEN
ABO/RH(D): A POS
Antibody Screen: NEGATIVE
Unit division: 0
Unit division: 0

## 2021-03-08 LAB — CBC
HCT: 28.5 % — ABNORMAL LOW (ref 39.0–52.0)
Hemoglobin: 9.1 g/dL — ABNORMAL LOW (ref 13.0–17.0)
MCH: 30.7 pg (ref 26.0–34.0)
MCHC: 31.9 g/dL (ref 30.0–36.0)
MCV: 96.3 fL (ref 80.0–100.0)
Platelets: 86 10*3/uL — ABNORMAL LOW (ref 150–400)
RBC: 2.96 MIL/uL — ABNORMAL LOW (ref 4.22–5.81)
RDW: 18.6 % — ABNORMAL HIGH (ref 11.5–15.5)
WBC: 9.8 10*3/uL (ref 4.0–10.5)
nRBC: 0.2 % (ref 0.0–0.2)

## 2021-03-08 LAB — GLUCOSE, CAPILLARY
Glucose-Capillary: 118 mg/dL — ABNORMAL HIGH (ref 70–99)
Glucose-Capillary: 121 mg/dL — ABNORMAL HIGH (ref 70–99)
Glucose-Capillary: 138 mg/dL — ABNORMAL HIGH (ref 70–99)
Glucose-Capillary: 147 mg/dL — ABNORMAL HIGH (ref 70–99)
Glucose-Capillary: 154 mg/dL — ABNORMAL HIGH (ref 70–99)
Glucose-Capillary: 97 mg/dL (ref 70–99)

## 2021-03-08 MED ORDER — PANTOPRAZOLE 2 MG/ML SUSPENSION
40.0000 mg | Freq: Two times a day (BID) | ORAL | Status: DC
  Administered 2021-03-08 – 2021-03-09 (×3): 40 mg
  Filled 2021-03-08 (×4): qty 20

## 2021-03-08 NOTE — Progress Notes (Signed)
Inpatient Rehab Admissions Coordinator:   I spoke with Pt. And daughter at length regarding potential CIR admit. Pt. Is interested and family can provide 24/7 support.I will pursue for potential admit pending bed availability.   Clemens Catholic, [place], [place] Admissions Coordinator  [phone] ([place]) [phone] (office)

## 2021-03-08 NOTE — Progress Notes (Signed)
Pt family requesting pts T-fasteners of PEG tube be assessed for removal as they were told by [facility] (place of placement) would either fall off or could be removed. Contacted MD, plans to put in IR consult.  Raelyn Number, RN

## 2021-03-08 NOTE — Progress Notes (Signed)
Mobility Specialist: Progress Note   03/08/21 1609  Mobility  Activity Transferred from chair to bed  Level of Assistance Minimal assist, patient does 75% or more  Assistive Device Front wheel walker  Distance Ambulated (ft) 2 ft  Activity Response Tolerated well  $Mobility charge 1 Mobility   Post-Mobility: 71 HR  Pt received in chair with c/o pain in his backside from sitting up. Pt assisted back to bed with call bell at his side and RN present in the room.   [facility] Tequlia Gonsalves Mobility Specialist Mobility Specialist [address] [place]: [phone] Mobility Specialist [address] [place]: [phone]

## 2021-03-08 NOTE — Progress Notes (Signed)
HD#6 Subjective:  Overnight Events: None  Patient was seen at bedside this AM with daughter and son present. The patient does not endorse any complaints or concerns today. Family states that he did well with PT and OT yesterday. Family is unsure if they want to proceed with CIR vs SNF at this time. No other complaints or concerns at this time.  Objective:  Vital signs in last 24 hours: Vitals:   03/07/21 2050 03/07/21 2357 03/08/21 0000 03/08/21 0403  BP: (!) 107/57 115/71 115/71 (!) 106/49  Pulse: 70 70 70 70  Resp:  (!) 21 (!) 21 (!) 21  Temp: 98 F (36.7 C) 97.6 F (36.4 C) 97.6 F (36.4 C) 98 F (36.7 C)  TempSrc: Oral Oral  Oral  SpO2: 98% 99%  96%  Weight:    66.7 kg  Height:       Supplemental O2: Room Air SpO2: 96 %   Physical Exam:  Physical Exam Constitutional:      General: He is not in acute distress.    Appearance: He is ill-appearing.  Eyes:     General:        Right eye: No discharge.        Left eye: No discharge.     Conjunctiva/sclera: Conjunctivae normal.  Cardiovascular:     Rate and Rhythm: Normal rate and regular rhythm.  Pulmonary:     Effort: Pulmonary effort is normal. No respiratory distress.  Abdominal:     Palpations: Abdomen is soft.     Comments: PEG tube in place.  Appears clean and noninfected  Skin:    General: Skin is dry.  Neurological:     Mental Status: He is alert.    Filed Weights   03/05/21 0534 03/07/21 0406 03/08/21 0403  Weight: 67.2 kg 71.7 kg 66.7 kg     Intake/Output Summary (Last 24 hours) at 03/08/2021 1245 Last data filed at 03/08/2021 0521 Gross per 24 hour  Intake 434 ml  Output 900 ml  Net -466 ml    Net IO Since Admission: 561.82 mL [03/08/21 0623]  Pertinent Labs: CBC Latest Ref Rng & Units 03/08/2021 03/07/2021 03/06/2021  WBC 4.0 - 10.5 K/uL 9.8 8.9 10.5  Hemoglobin 13.0 - 17.0 g/dL 9.1(L) 8.0(L) 7.2(L)  Hematocrit 39.0 - 52.0 % 28.5(L) 24.8(L) 22.0(L)  Platelets 150 - 400 K/uL 86(L) 88(L)  94(L)    CMP Latest Ref Rng & Units 03/07/2021 03/06/2021 03/04/2021  Glucose 70 - 99 mg/dL 132(H) 139(H) 87  BUN 8 - 23 mg/dL 40(H) 41(H) 91(H)  Creatinine 0.61 - 1.24 mg/dL 1.47(H) 1.45(H) 1.55(H)  Sodium 135 - 145 mmol/L 144 146(H) 146(H)  Potassium 3.5 - 5.1 mmol/L 4.1 3.6 3.9  Chloride 98 - 111 mmol/L 118(H) 120(H) 122(H)  CO2 22 - 32 mmol/L 20(L) 22 20(L)  Calcium 8.9 - 10.3 mg/dL 7.7(L) 7.8(L) 7.9(L)  Total Protein 6.5 - 8.1 g/dL - - -  Total Bilirubin 0.3 - 1.2 mg/dL - - -  Alkaline Phos 38 - 126 U/L - - -  AST 15 - 41 U/L - - -  ALT 0 - 44 U/L - - -    Imaging: No results found.  Assessment/Plan:   Principal Problem:   Symptomatic anemia Active Problems:   CAD (coronary artery disease)   Atrial flutter (HCC)   Dysphagia   Protein malnutrition (HCC)   Gastrointestinal hemorrhage with melena   Protein-calorie malnutrition, severe   Pressure injury of skin   Patient  Summary: Gregory [name] is a 88 y.o. male with a PMHx of aortic stenosis, paroxysmal atrial fibrillation on Eliquis, atrioventricular complete heart block status post permanent pacemaker implantation, HLD, hypothyroidism, iron deficiency, and prior COVID infection in June 2022 who presents to the ED today with a CC of dizziness and near syncope, found to have symptomatic anemia likely secondary to GI bleed.  Symptomatic anemia 2/2 suspected upper GI bleed Hemoglobin at 8.0 this morning after receiving one unit of blood yesterday.  Family has spoken to palliative care team and decided to continue with full scope of treatment. Clinically, he is ready for discharge. However he is high risk for rebleeding. Family originally planned on [facility] with outpatient Hgb checks, but patient now wishes to pursue rehab. It appears that he is a candidate for CIR.  We we will reach out to CIR today to come talk to the patient per family request.   -Continue sucralfate, switched IV Protonix to oral suspension per tube today -CBC in  AM -Transfusion goal of greater than 7 -PT/OT eval  Thrombocytopenia Review of records shows mild thrombocytopenia in the low 100s in 2021. Platelets have been downtrending, now at 60 today.  Peripheral smear showed thrombocytopenia but no other concerning findings just as schistocytes.  Likely 2/2 poor nutrition given that patient does not have liver disease does not taking any offending drugs.  Will CTM at this time and recommend outpatient work-up otherwise. -Trend CBC   Dysphagia s/p PEG Pt has a history of oropharyngeal dysphagia since July 2022. Got a PEG tube placed on 1/10.   -Appreciate RD recommendations -Continue free water flushes to prevent dehydration  History of a flutter S/p TAVR in 2020 Patient had a prosthetic valve placed in 2020 at [facility].  He is also status post pacemaker placement due to complete heart block. Previously discussed risks and benefits of holding Eliquis. Family verbalized understanding and state that they might resume Eliquis a few weeks after discharge to allow for healing. -Continue holding Eliquis  Hypothyroidism TSH was 3.2.  Patient is on Synthroid at home. -Continue Synthroid 75 mcg   Hyperlipidemia -Continue home atorvastatin   Diet: NPO IVF: Tube feeds VTE: SCDs Code: Full PT/OT recs: CIR  Dispo: Anticipated discharge pending placement, potentially CIR  Orvis Brill, MD 03/08/2021, 6:23 AM Pager: [phone]  Please contact the on call pager after 5 pm and on weekends at [phone].

## 2021-03-08 NOTE — PMR Pre-admission (Signed)
PMR Admission Coordinator Pre-Admission Assessment  Patient: Gregory [name] is an 88 y.o., male MRN: [id number] DOB: 1920-07-24 Height: _0  (172.7 cm) Weight: 66.7 kg  Insurance Information HMO:     PPO:      PCP:      IPA:      80/20:      OTHER:  PRIMARY: Medicare       Policy#:   [id number]    Subscriber: Pt. Phone#: Verified online    Fax#:  Pre-Cert#:       Employer:  Benefits:  Phone #:      Name:  Eff. Date: Parts A ad B effective  09/13/87 Deduct: $1600      Out of Pocket Max:  None      Life Max: N/A  CIR: 100%      SNF: 100 days Outpatient: 80%     Co-Pay: 20% Home Health: 100%      Co-Pay: none DME: 80%     Co-Pay: 20% Providers: patient's choice Providers: in network SECONDARY: [place]      Policy#: [id number]     Phone#:    Financial Counselor:       Phone#:   The Actuary for patients in Inpatient Rehabilitation Facilities with attached Privacy Act Evan Records was provided and verbally reviewed with: Patient and Family  Emergency Contact Information Contact Information     Name Relation Home Work Mobile   [place] Daughter   [phone]   Arvis, Zwahlen   [phone]       Current Medical History  Patient Admitting Diagnosis: GI Bleed History of Present Illness: Mr. Leiner is a 88 year old person with history of aortic stenosis status post prosthetic valve replacement in 2020, paroxysmal A. fib on Eliquis, complete heart block status post permanent pacemaker implantation, oropharyngeal dysphagia status post PEG tube placement in December 2022, hyperlipidemia, hypothyroidism, and prior COVID infection in June 2022 who presented  to the ED 03/02/20 for complaints of dizziness and near syncope.   Patient recently hospitalized at the [facility] in [place] for PEG placement secondary to dysphagia (pt. Had a choking episode at GI office, swallow study performed at [facility] revealed no aspiration) in  December.  No p.o. intake since that time.  During hospitalization for PEG tube placement appears that hemoglobin dropped below 7 several times requiring blood transfusions.  Patient denies dark, black, or bloody stools although notes that he has not had a bowel movement in about 2 weeks since discharge from previous hospitalization until yesterday when he had 2 movements.  On initial evaluation in the emergency department patient was hypotensive with a MAP as low as 53.  Heart rate was in the 70s.  Tachypneic although on room air.  Patient received 2 L IV fluids with improvement in blood pressure.  Labs notable for hemoglobin of 5.9, mild leukocytosis to 12.3, and normal platelets.  Patient received 2 units of blood.  GI was consulted and recommended IV PPI, n.p.o. and transfusions for hemoglobin goal greater than 7.  GI team concerned about patient's high risk for undergoing anesthesia or endoscopic procedures at this time. Overnight patient continued to be intermittently hypotensive.  Received an additional 1 L of IV fluids and 1 unit of blood for hemoglobin of 6.9.  Ferritin elevated, B12 folate normal.  Hypoproliferative response on reticulocyte index. GI consulted and felt that had an UGI bleed, but recommended against anaesthesia and endoscopic procedures due to advanced age and low Hgb.  Pt. Received multiple units of PRBCs and CIR was consulted to assist return to PLOF.  [facility]'s [facility] record from [facility]  has been reviewed by the rehabilitation admission coordinator and physician.  Past Medical History  Past Medical History:  Diagnosis Date   Atrial flutter [facility])    s/p ablation   Cancer [facility])    skin cancer   Cataracts, bilateral    Hypertension    Neuromuscular disorder [facility])    Pacemaker 2010   Thyroid disease     Has the patient had major surgery during 100 days prior to admission? No  Family History   family history includes Cancer in his father; Heart disease in his  mother.  Current Medications  Current Facility-Administered Medications:    0.9 %  sodium chloride infusion (Manually program via Guardrails IV Fluids), , Intravenous, Once, Orvis Brill, MD, Stopped at 03/06/21 2233   atorvastatin (LIPITOR) tablet 40 mg, 40 mg, Per Tube, QHS, Gaylan Gerold, DO, 40 mg at 03/07/21 2251   feeding supplement (OSMOLITE 1.5 CAL) liquid 355 mL, 355 mL, Per Tube, BID, Charise Killian, MD, 355 mL at 03/07/21 2251   feeding supplement (OSMOLITE 1.5 CAL) liquid 474 mL, 474 mL, Per Tube, Q2000, Charise Killian, MD, 474 mL at 03/08/21 1145   free water 200 mL, 200 mL, Per Tube, Q6H, Hoffman, Erik C, DO, 200 mL at 03/08/21 1300   levothyroxine (SYNTHROID) tablet 75 mcg, 75 mcg, Per Tube, Q0600, Gaylan Gerold, DO, 75 mcg at 03/08/21 0510   pantoprazole sodium (PROTONIX) 40 mg/20 mL oral suspension 40 mg, 40 mg, Per Tube, BID, Orvis Brill, MD, 40 mg at 03/08/21 1145   sucralfate (CARAFATE) 1 GM/10ML suspension 1 g, 1 g, Per Tube, Q8H, Gaylan Gerold, DO, 1 g at 03/08/21 1408  Patients Current Diet:  Diet Order             Diet NPO time specified  Diet effective now                   Precautions / Restrictions Precautions Precautions: Fall, Other (comment) Precaution Comments: peg tube Restrictions Weight Bearing Restrictions: No   Has the patient had 2 or more falls or a fall with injury in the past year? Yes  Prior Activity Level Community (5-7x/wk): Pt. went out regularly PTA  Prior Functional Level Self Care: Did the patient need help bathing, dressing, using the toilet or eating? Independent  Indoor Mobility: Did the patient need assistance with walking from room to room (with or without device)? Independent  Stairs: Did the patient need assistance with internal or external stairs (with or without device)? Independent  Functional Cognition: Did the patient need help planning regular tasks such as shopping or remembering to take medications?  Independent  Patient Information Are you of Hispanic, Latino/a,or Spanish origin?: A. No, not of Hispanic, Latino/a, or Spanish origin What is your race?: A. White Do you need or want an interpreter to communicate with a doctor or health care staff?: 0. No  Patient's Response To:  Health Literacy and Transportation Is the patient able to respond to health literacy and transportation needs?: Yes Health Literacy - How often do you need to have someone help you when you read instructions, pamphlets, or other written material from your doctor or pharmacy?: Never In the past 12 months, has lack of transportation kept you from medical appointments or from getting medications?: No In the past 12 months, has lack of transportation kept you  from meetings, work, or from getting things needed for daily living?: No  Development worker, international aid / [place]: Cane - single point, Grab bars - tub/shower  Prior Device Use: Indicate devices/aids used by the patient prior to current illness, exacerbation or injury? None of the above  Current Functional Level Cognition  Overall Cognitive Status: Within Functional Limits for tasks assessed Orientation Level: Oriented X4 General Comments: WFL for simple tasks, not formally assessed; pt HOH    Extremity Assessment (includes Sensation/Coordination)  Upper Extremity Assessment: Generalized weakness  Lower Extremity Assessment: Generalized weakness    ADLs  Overall ADL's : Needs assistance/impaired Eating/Feeding: NPO Grooming: Supervision/safety, Set up, Sitting Upper Body Bathing: Minimal assistance, Sitting Lower Body Bathing: Maximal assistance, Sit to/from stand Upper Body Dressing : Minimal assistance, Sitting Lower Body Dressing: Maximal assistance, Sit to/from stand Toilet Transfer: Moderate assistance, Maximal assistance, +2 for physical assistance, +2 for safety/equipment, Stand-pivot, Rolling walker (2 wheels) (simulated to  recliner) Toilet Transfer Details (indicate cue type and reason): Mod-Max A for power up from EOB. Assistance to manage RW and pivot to recliner. Performing second sit<>stand with Mod A +2 and stabilizing RW. posterior lean Functional mobility during ADLs: Minimal assistance, Moderate assistance, Rolling walker (2 wheels), +2 for physical assistance, +2 for safety/equipment General ADL Comments: Presenting with decreased balance and strength. Very motivated    Mobility  Overal bed mobility: Needs Assistance Bed Mobility: Supine to Sit Supine to sit: Min assist, HOB elevated General bed mobility comments: increased time and effort, minA for trunk elevation    Transfers  Overall transfer level: Needs assistance Equipment used: Rolling walker (2 wheels) Transfers: Sit to/from Stand Sit to Stand: Mod assist Bed to/from chair/wheelchair/BSC transfer type:: Step pivot Stand pivot transfers: +2 safety/equipment, +2 physical assistance, Mod assist Step pivot transfers: Mod assist, +2 physical assistance, +2 safety/equipment General transfer comment: Multiple sit<>stand from EOB and recliner, repeated verbal cues for hand placement and sequencing, heavy modA for trunk elevation; pt bracing BLEs against bed/chair with posterior lean to stand, difficulty correct despite cues    Ambulation / Gait / Stairs / Wheelchair Mobility  Ambulation/Gait Ambulation/Gait assistance: Mod assist Gait Distance (Feet): 8 Feet (+ 36') Assistive device: Rolling walker (2 wheels) Gait Pattern/deviations: Step-to pattern, Step-through pattern, Decreased stride length, Trunk flexed, Leaning posteriorly General Gait Details: Very slow, guarded, unsteady gait with RW and modA for stability, noted knee instability requiring intermittent assist to prevent LOB and manage RW; 1x seated rest at sink for ADL tasks; close chair follow for safety to allow additional seated rest Gait velocity: Decreased    Posture / Balance  Balance Overall balance assessment: Needs assistance Sitting-balance support: No upper extremity supported, Feet supported Sitting balance-Leahy Scale: Fair Standing balance support: Bilateral upper extremity supported, During functional activity Standing balance-Leahy Scale: Poor Standing balance comment: heavy reliance on BUE support and external assist, posterior lean improving this session    Special needs/care consideration Skin Abrasion: sacrum/mid; Blister; heel/left; Ecchymosis: arm/bilateral; Pressure injury: sacrum/mid; heel/right; heel/left; foot/right, lateral; foot/left, lateral; Wound: toe tips of great toes/right,left,bilateral External urinary catheter  and Special service needs PEG, bolus feeds   Previous Home Environment (from acute therapy documentation) Living Arrangements: Children (son) Available Help at Discharge: Family Type of Home: House Home Layout: Two level, Able to live on main level with bedroom/bathroom, 1/2 bath on main level, Bed/bath upstairs Alternate Level Stairs-Rails: Right Home Access: Stairs to enter Entrance Stairs-Rails: Right Entrance Stairs-Number of Steps: 2 Bathroom Shower/Tub: Holiday representative  Toilet: Handicapped height Bathroom Accessibility: Yes How Accessible: Accessible via wheelchair, Accessible via walker Additional Comments: Son lives with pt; daughter nearby and does 17 of household tasks  Discharge Living Setting Plans for Discharge Living Setting: Patient's home Type of Home at Discharge: House Discharge Home Layout: Two level, Able to live on main level with bedroom/bathroom Alternate Level Stairs-Rails: Right Alternate Level Stairs-Number of Steps: full flight, chair lift Discharge Home Access: Stairs to enter Entrance Stairs-Rails: None Entrance Stairs-Number of Steps: 2 Discharge Bathroom Shower/Tub: Tub/shower unit Discharge Bathroom Toilet: Handicapped height Discharge Bathroom Accessibility: Yes How  Accessible: Accessible via wheelchair, Accessible via walker  Social/Family/Support Systems Patient Roles: Other (Comment) Contact Information: [phone] Anticipated Caregiver: Tobiah Celestine Anticipated Caregiver's Contact Information: [phone] Caregiver Availability: 24/7 Discharge Plan Discussed with Primary Caregiver: Yes Is Caregiver In Agreement with Plan?: Yes Does Caregiver/Family have Issues with Lodging/Transportation while Pt is in Rehab?: Yes  Goals Patient/Family Goal for Rehab: PT/OT/SLP Supervision Expected length of stay: 14-16 days Pt/Family Agrees to Admission and willing to participate: Yes Program Orientation Provided & Reviewed with Pt/Caregiver Including Roles  & Responsibilities: Yes  Decrease burden of Care through IP rehab admission: Specialzed equipment needs, Diet advancement, Decrease number of caregivers, Bowel and bladder program, and Patient/family education  Possible need for SNF placement upon discharge: not anticipated.  Patient Condition: I have reviewed medical records from [facility], spoken with CM, and patient and daughter. I met with patient at the bedside for inpatient rehabilitation assessment.  Patient will benefit from ongoing PT, OT, and SLP, can actively participate in 3 hours of therapy a day 5 days of the week, and can make measurable gains during the admission.  Patient will also benefit from the coordinated team approach during an Inpatient Acute Rehabilitation admission.  The patient will receive intensive therapy as well as Rehabilitation physician, nursing, social worker, and care management interventions.  Due to safety, skin/wound care, disease management, medication administration, pain management, and patient education the patient requires 24 hour a day rehabilitation nursing.  The patient is currently mod A with mobility and basic ADLs.  Discharge setting and therapy post discharge at home with home health is  anticipated.  Patient has agreed to participate in the Acute Inpatient Rehabilitation Program and will admit today.  Preadmission Screen Completed By:  Genella Mech with updates by Gayland Curry, 03/08/2021 3:13 PM ______________________________________________________________________   Discussed status with Dr. Ranell Patrick on 03/10/21  at 11:25 AM and received approval for admission today.  Admission Coordinator:  Genella Mech, CCC-SLP with updates by Gayland Curry, MS, CCC-SLP, time 11:26 AM/Date 03/10/21    Assessment/Plan: Diagnosis: Debility Does the need for close, 24 hr/day Medical supervision in concert with the patient's rehab needs make it unreasonable for this patient to be served in a less intensive setting? Yes Co-Morbidities requiring supervision/potential complications: symptomatic anemia, GI bleed, atrial flutter, CAD, dysphagia, protein malnutrition Due to bladder management, bowel management, safety, skin/wound care, disease management, medication administration, pain management, and patient education, does the patient require 24 hr/day rehab nursing? Yes Does the patient require coordinated care of a physician, rehab nurse, PT, OT, and SLP to address physical and functional deficits in the context of the above medical diagnosis(es)? Yes Addressing deficits in the following areas: balance, endurance, locomotion, strength, transferring, bowel/bladder control, bathing, dressing, feeding, grooming, toileting, cognition, and psychosocial support Can the patient actively participate in an intensive therapy program of at least 3 hrs of therapy 5 days  a week? Yes The potential for patient to make measurable gains while on inpatient rehab is excellent Anticipated functional outcomes upon discharge from inpatient rehab: supervision PT, supervision OT, supervision SLP Estimated rehab length of stay to reach the above functional goals is: 10-14 days Anticipated discharge  destination: Home 10. Overall Rehab/Functional Prognosis: excellent   MD Signature: Leeroy Cha, MD

## 2021-03-08 NOTE — TOC Progression Note (Signed)
Transition of Care (TOC) - Progression Note  Marvetta Gibbons RN, BSN Transitions of Care Unit 4E- RN Case Manager See Treatment Team for direct phone #    Patient Details  Name: Gregory [name] MRN: [id number] Date of Birth: 01-04-1921  Transition of Care [facility]) CM/SW Contact  Dahlia Client, Romeo Rabon, RN Phone Number: 03/08/2021, 11:51 AM  Clinical Narrative:    Received msg from daughter regarding family wanting to look at [facility] rehab options vs [facility]- daughter requesting list of facilities for [place] area.  CM spoke with daughter at bedside regarding SNF placement process, PT eval pending. List provided of facilities within 25 miles of pt's zip code for daughter to review.  Daughter agreeable to have pt info faxed out to area SNFs requesting [facility], [facility], [facility] as top choices but also agreeable to fax to others as backup.  Daughter voices that if they do not receive acceptable bed offers then they will "just take dad home with [facility]".   CSW to f/u for SNF workup pending PT eval   Expected Discharge Plan: [place] Barriers to Discharge: Continued Medical Work up  Expected Discharge Plan and Services Expected Discharge Plan: [place]   Discharge Planning Services: CM Consult Post Acute Care Choice: Durable Medical Equipment, Home Health Living arrangements for the past 2 months: Single Family Home                 DME Arranged: Hospital bed DME Agency: [employer] Date DME Agency Contacted: 03/06/21 Time DME Agency Contacted: 1208 Representative spoke with at DME Agency: [place]: RN, PT, OT [place] Agency: [place] Date [place]: 03/06/21 Time [place]: 1208 Representative spoke with at [place]: [place] ([place]) Interventions    Readmission Risk Interventions No flowsheet data found.

## 2021-03-08 NOTE — Progress Notes (Signed)
Physical Therapy Treatment Patient Details Name: Edger Husain MRN: [id number] DOB: 1920-07-24 Today's Date: 03/08/2021   History of Present Illness Pt is a 88 y.o. male admitted 03/02/21 with dizziness and near-sycnope; workup for anemia secondary to GIB. Of note, pt with h/o oropharyngeal dysphagia s/p PEG placement 7/67/34 (??-conflicting dates in chart) at [facility] in [facility]. PMH includes aflutter, HTN, pacemaker, CA, neuromuscular disorder, cataracts, COVID (07/2020).   PT Comments    Pt progressing well with mobility, motivated to participate despite fatigue. Today's session focused on transfer and gait training with RW. Pt remains limited by generalized weakness, decreased activity tolerance, and impaired balance strategies/postural reactions. Continue to recommend intensive CIR-level therapies to maximize functional mobility and independence prior to return home. Daughter reports plan to discuss this with CIR [facility] today.    Recommendations for follow up therapy are one component of a multi-disciplinary discharge planning process, led by the attending physician.  Recommendations may be updated based on patient status, additional functional criteria and insurance authorization.  Follow Up Recommendations  Acute inpatient rehab (3hours/day)     Assistance Recommended at Discharge Frequent or constant Supervision/Assistance  Patient can return home with the following A lot of help with walking and/or transfers;A lot of help with bathing/dressing/bathroom;Assistance with cooking/housework;Assist for transportation;Help with stairs or ramp for entrance   Equipment Recommendations  Rolling walker (2 wheels);BSC/3in1;Wheelchair (measurements PT) (TBD)    Recommendations for Other Services       Precautions / Restrictions Precautions Precautions: Fall;Other (comment) Precaution Comments: peg tube Restrictions Weight Bearing Restrictions: No     Mobility  Bed Mobility Overal bed  mobility: Needs Assistance Bed Mobility: Supine to Sit     Supine to sit: Min assist, HOB elevated     General bed mobility comments: increased time and effort, minA for trunk elevation    Transfers Overall transfer level: Needs assistance Equipment used: Rolling walker (2 wheels) Transfers: Sit to/from Stand Sit to Stand: Mod assist           General transfer comment: Multiple sit<>stand from EOB and recliner, repeated verbal cues for hand placement and sequencing, heavy modA for trunk elevation; pt bracing BLEs against bed/chair with posterior lean to stand, difficulty correct despite cues    Ambulation/Gait Ambulation/Gait assistance: Mod assist Gait Distance (Feet): 8 Feet (+ 36') Assistive device: Rolling walker (2 wheels) Gait Pattern/deviations: Step-to pattern, Step-through pattern, Decreased stride length, Trunk flexed, Leaning posteriorly Gait velocity: Decreased     General Gait Details: Very slow, guarded, unsteady gait with RW and modA for stability, noted knee instability requiring intermittent assist to prevent LOB and manage RW; 1x seated rest at sink for ADL tasks; close chair follow for safety to allow additional seated rest   Stairs             Wheelchair Mobility    Modified Rankin (Stroke Patients Only)       Balance Overall balance assessment: Needs assistance Sitting-balance support: No upper extremity supported, Feet supported Sitting balance-Leahy Scale: Fair     Standing balance support: Bilateral upper extremity supported, During functional activity Standing balance-Leahy Scale: Poor Standing balance comment: heavy reliance on BUE support and external assist, posterior lean improving this session                            Cognition Arousal/Alertness: Awake/alert Behavior During Therapy: WFL for tasks assessed/performed Overall Cognitive Status: Within Functional Limits for tasks assessed  General Comments: WFL for simple tasks, not formally assessed; pt HOH        Exercises      General Comments General comments (skin integrity, edema, etc.): Pt's daughter present at end of session - discussed recommendation for CIR; daughter in agreement      Pertinent Vitals/Pain Pain Assessment Pain Assessment: No/denies pain Pain Intervention(s): Monitored during session    Home Living                          Prior Function            PT Goals (current goals can now be found in the care plan section) Progress towards PT goals: Progressing toward goals    Frequency    Min 3X/week      PT Plan Current plan remains appropriate    Co-evaluation              AM-PAC PT "6 Clicks" Mobility   Outcome Measure  Help needed turning from your back to your side while in a flat bed without using bedrails?: A Little Help needed moving from lying on your back to sitting on the side of a flat bed without using bedrails?: A Lot Help needed moving to and from a bed to a chair (including a wheelchair)?: A Lot Help needed standing up from a chair using your arms (e.g., wheelchair or bedside chair)?: A Lot Help needed to walk in hospital room?: A Lot Help needed climbing 3-5 steps with a railing? : Total 6 Click Score: 12    End of Session Equipment Utilized During Treatment: Gait belt Activity Tolerance: Patient tolerated treatment well Patient left: in chair;with call bell/phone within reach;with chair alarm set;with family/visitor present Nurse Communication: Mobility status PT Visit Diagnosis: Other abnormalities of gait and mobility (R26.89);Muscle weakness (generalized) (M62.81)     Time: 1572-6203 PT Time Calculation (min) (ACUTE ONLY): 24 min  Charges:  $Gait Training: 8-22 mins $Therapeutic Activity: 8-22 mins                     Mabeline Caras, PT, DPT Acute Rehabilitation Services  Pager [phone] Office  [place] 03/08/2021, 12:13 PM

## 2021-03-09 DIAGNOSIS — D649 Anemia, unspecified: Secondary | ICD-10-CM | POA: Diagnosis not present

## 2021-03-09 LAB — CBC
HCT: 30.2 % — ABNORMAL LOW (ref 39.0–52.0)
Hemoglobin: 9.6 g/dL — ABNORMAL LOW (ref 13.0–17.0)
MCH: 30.8 pg (ref 26.0–34.0)
MCHC: 31.8 g/dL (ref 30.0–36.0)
MCV: 96.8 fL (ref 80.0–100.0)
Platelets: 87 10*3/uL — ABNORMAL LOW (ref 150–400)
RBC: 3.12 MIL/uL — ABNORMAL LOW (ref 4.22–5.81)
RDW: 18.9 % — ABNORMAL HIGH (ref 11.5–15.5)
WBC: 9.7 10*3/uL (ref 4.0–10.5)
nRBC: 0 % (ref 0.0–0.2)

## 2021-03-09 LAB — GLUCOSE, CAPILLARY
Glucose-Capillary: 101 mg/dL — ABNORMAL HIGH (ref 70–99)
Glucose-Capillary: 132 mg/dL — ABNORMAL HIGH (ref 70–99)
Glucose-Capillary: 89 mg/dL (ref 70–99)
Glucose-Capillary: 91 mg/dL (ref 70–99)
Glucose-Capillary: 98 mg/dL (ref 70–99)

## 2021-03-09 MED ORDER — SENNOSIDES-DOCUSATE SODIUM 8.6-50 MG PO TABS
1.0000 | ORAL_TABLET | Freq: Once | ORAL | Status: AC
  Administered 2021-03-09: 1 via ORAL
  Filled 2021-03-09: qty 1

## 2021-03-09 MED ORDER — POLYETHYLENE GLYCOL 3350 17 G PO PACK
17.0000 g | PACK | Freq: Every day | ORAL | Status: AC
  Administered 2021-03-09: 17 g via ORAL
  Filled 2021-03-09: qty 1

## 2021-03-09 MED ORDER — PANTOPRAZOLE 2 MG/ML SUSPENSION
40.0000 mg | Freq: Every day | ORAL | Status: DC
  Administered 2021-03-10: 40 mg
  Filled 2021-03-09: qty 20

## 2021-03-09 MED ORDER — GUAIFENESIN-DM 100-10 MG/5ML PO SYRP: 5.0000 mL | ORAL_SOLUTION | ORAL | Status: DC | PRN

## 2021-03-09 NOTE — Progress Notes (Signed)
Occupational Therapy Treatment Patient Details Name: Gregory [name] MRN: [id number] DOB: [birth year] Today's Date: 03/09/2021   History of present illness Pt is a 88 y.o. male admitted 03/02/21 with dizziness and near-sycnope; workup for anemia secondary to GIB. Of note, pt with h/o oropharyngeal dysphagia s/p PEG placement 9/56/21 (??-conflicting dates in chart) at [facility] in [facility]. PMH includes aflutter, HTN, pacemaker, CA, neuromuscular disorder, cataracts, COVID (07/2020).   OT comments  Patient received in bed and patient eager to get out of bed. Patient required verbal cues and min assist to get to EOB with increased time. Patient performed transfer to recliner with RW and mod assist with verbal cues for hand placement. Patient performed grooming tasks seated in recliner with verbal cues and min assist to complete combing hair. Patient performed LB dressing with doffing and donning socks with max assist. Acute OT to continue to follow.    Recommendations for follow up therapy are one component of a multi-disciplinary discharge planning process, led by the attending physician.  Recommendations may be updated based on patient status, additional functional criteria and insurance authorization.    Follow Up Recommendations  Acute inpatient rehab (3hours/day)    Assistance Recommended at Discharge Frequent or constant Supervision/Assistance  Patient can return home with the following  Two people to help with walking and/or transfers;A lot of help with bathing/dressing/bathroom;Assistance with cooking/housework   Equipment Recommendations  Other (comment) (TBD)    Recommendations for Other Services      Precautions / Restrictions Precautions Precautions: Fall;Other (comment) Precaution Comments: peg tube Restrictions Weight Bearing Restrictions: No       Mobility Bed Mobility Overal bed mobility: Needs Assistance Bed Mobility: Supine to Sit     Supine to sit: Min assist, HOB  elevated     General bed mobility comments: verbal cues with instructions for technique and rail use    Transfers Overall transfer level: Needs assistance Equipment used: Rolling walker (2 wheels) Transfers: Sit to/from Stand, Bed to chair/wheelchair/BSC Sit to Stand: Mod assist     Step pivot transfers: Mod assist     General transfer comment: RW use with mod assist for balance     Balance Overall balance assessment: Needs assistance Sitting-balance support: No upper extremity supported, Feet supported Sitting balance-Leahy Scale: Fair     Standing balance support: Bilateral upper extremity supported, During functional activity Standing balance-Leahy Scale: Poor Standing balance comment: reliant on BUE support                           ADL either performed or assessed with clinical judgement   ADL Overall ADL's : Needs assistance/impaired     Grooming: Wash/dry hands;Wash/dry face;Oral care;Brushing hair;Minimal assistance Grooming Details (indicate cue type and reason): min assist to complete combing hair seated in recliner             Lower Body Dressing: Maximal assistance;Sit to/from stand Lower Body Dressing Details (indicate cue type and reason): able to doff left sock and partically for right.  Max assist to [employer] ADL Comments: grooming and LB dressing addressed seated in recliner    Extremity/Trunk Assessment              Vision       Perception     Praxis      Cognition Arousal/Alertness: Awake/alert Behavior During Therapy: [facility] for tasks assessed/performed Overall Cognitive  Status: Within Functional Limits for tasks assessed                                 General Comments: HOH, required step by step instructions for bed mobiltiy and tranfers.        Exercises      Shoulder Instructions       General Comments      Pertinent Vitals/ Pain       Pain Assessment Pain  Assessment: Faces Faces Pain Scale: No hurt Pain Intervention(s): Monitored during session  Home Living                                          Prior Functioning/Environment              Frequency  Min 2X/week        Progress Toward Goals  OT Goals(current goals can now be found in the care plan section)  Progress towards OT goals: Progressing toward goals  Acute Rehab OT Goals Patient Stated Goal: get better OT Goal Formulation: With patient/family Time For Goal Achievement: 03/21/21 Potential to Achieve Goals: Good ADL Goals Pt Will Perform Lower Body Dressing: with min assist;sit to/from stand Pt Will Transfer to Toilet: with min assist;bedside commode;ambulating Pt Will Perform Toileting - Clothing Manipulation and hygiene: with min assist;sitting/lateral leans;sit to/from stand Additional ADL Goal #1: Pt will perform bed mobility with Supervision in preparation for ADLs  Plan Discharge plan remains appropriate    Co-evaluation                 AM-PAC OT "6 Clicks" Daily Activity     Outcome Measure   Help from another person eating meals?: Total (PEG) Help from another person taking care of personal grooming?: A Little Help from another person toileting, which includes using toliet, bedpan, or urinal?: A Lot Help from another person bathing (including washing, rinsing, drying)?: A Lot Help from another person to put on and taking off regular upper body clothing?: A Little Help from another person to put on and taking off regular lower body clothing?: A Lot 6 Click Score: 13    End of Session Equipment Utilized During Treatment: Gait belt;Rolling walker (2 wheels)  OT Visit Diagnosis: Unsteadiness on feet (R26.81);Other abnormalities of gait and mobility (R26.89);Muscle weakness (generalized) (M62.81)   Activity Tolerance Patient tolerated treatment well   Patient Left in chair;with call bell/phone within reach;with family/visitor  present   Nurse Communication Mobility status        Time: 6712-4580 OT Time Calculation (min): 31 min  Charges: OT General Charges $OT Visit: 1 Visit OT Treatments $Self Care/Home Management : 8-22 mins $Therapeutic Activity: 8-22 mins  Lodema Hong, OTA Acute Rehabilitation Services  Pager [phone] Office [phone]   Trixie Dredge 03/09/2021, 12:36 PM

## 2021-03-09 NOTE — Progress Notes (Signed)
IP rehab admissions - I visited patient and his daughter at the bedside.  They do want CIR.  We may have a bed on CIR Fri/Sat.  My colleague will follow up tomorrow for bed availability.  Call for questions.  [phone]

## 2021-03-09 NOTE — Plan of Care (Signed)

## 2021-03-09 NOTE — Progress Notes (Signed)
Mobility Specialist: Progress Note   03/09/21 1504  Mobility  Activity Ambulated with assistance in room  Level of Assistance Minimal assist, patient does 75% or more  Assistive Device Front wheel walker  Distance Ambulated (ft) 24 ft  Activity Response Tolerated well  $Mobility charge 1 Mobility   Pre-Mobility: 70 HR Post-Mobility: 72 HR  Received pt in chair having no complaints and agreeable to mobility. Asymptomatic throughout ambulation, returned back to bed w/ call bell in reach and all needs met.  [facility] Amyia Lodwick Mobility Specialist Mobility Specialist 4 [place]: [phone] Mobility Specialist 2 [place]: [phone]

## 2021-03-09 NOTE — Progress Notes (Addendum)
HD#7 SUBJECTIVE:  Patient Summary: Gregory [name] is a 88 y.o. male with a PMHx of aortic stenosis, paroxysmal atrial fibrillation on Eliquis, atrioventricular complete heart block status post permanent pacemaker implantation, HLD, hypothyroidism, iron deficiency, and prior COVID infection in June 2022 who presents to the ED today with a CC of dizziness and near syncope, found to have symptomatic anemia likely secondary to GI bleed.  Overnight Events: none  Interim History: Patient resting in bed.  Otherwise, he denies any new symptoms. He denies any obvious signs of bleeding.   OBJECTIVE:  Vital Signs: Vitals:   03/09/21 0418 03/09/21 0652 03/09/21 0800 03/09/21 1120  BP: (!) 124/58  (!) 112/51 (!) 134/56  Pulse: 72  77 72  Resp: 19  20 20   Temp: 98 F (36.7 C)  97.7 F (36.5 C) 97.9 F (36.6 C)  TempSrc: Oral  Oral Oral  SpO2: 99%  100% 100%  Weight:  67.1 kg    Height:       Supplemental O2: Room Air SpO2: 100 %  Filed Weights   03/07/21 0406 03/08/21 0403 03/09/21 0652  Weight: 71.7 kg 66.7 kg 67.1 kg     Intake/Output Summary (Last 24 hours) at 03/09/2021 1345 Last data filed at 03/09/2021 0651 Gross per 24 hour  Intake 355 ml  Output 1510 ml  Net -1155 ml   Net IO Since Admission: -593.18 mL [03/09/21 1345]  Physical Exam: Physical Exam Constitutional:      Appearance: Normal appearance.  HENT:     Head: Normocephalic and atraumatic.  Eyes:     Extraocular Movements: Extraocular movements intact.  Cardiovascular:     Rate and Rhythm: Normal rate.     Pulses: Normal pulses.     Heart sounds: Normal heart sounds.  Pulmonary:     Effort: Pulmonary effort is normal.     Breath sounds: Normal breath sounds.  Abdominal:     General: Bowel sounds are normal.     Palpations: Abdomen is soft.     Tenderness: There is no abdominal tenderness.  Musculoskeletal:        General: Normal range of motion.     Cervical back: Normal range of motion.     Right  lower leg: No edema.     Left lower leg: No edema.  Skin:    General: Skin is warm and dry.  Neurological:     Mental Status: He is alert and oriented to person, place, and time. Mental status is at baseline.  Psychiatric:        Mood and Affect: Mood normal.    Patient Lines/Drains/Airways Status     Active Line/Drains/Airways     Name Placement date Placement time Site Days   Peripheral IV 03/02/21 20 G Left Antecubital 03/02/21  1109  Antecubital  7   Gastrostomy/Enterostomy Gastrostomy LLQ --  --  LLQ  --   External Urinary Catheter 03/02/21  1202  --  7   Incision (Closed) 10/12/13 Groin Left 10/12/13  0943  -- 2705   Pressure Injury 03/03/21 Sacrum Mid 03/03/21  1108  -- 6   Pressure Injury 03/03/21 Heel Right Stage 1 -  Intact skin with non-blanchable redness of a localized area usually over a bony prominence. 03/03/21  1110  -- 6   Pressure Injury 03/03/21 Heel Left Deep Tissue Pressure Injury - Purple or maroon localized area of discolored intact skin or blood-filled blister due to damage of underlying soft tissue from pressure and/or shear.  03/03/21  1114  -- 6   Pressure Injury 03/03/21 Foot Right;Lateral Stage 1 -  Intact skin with non-blanchable redness of a localized area usually over a bony prominence. 03/03/21  1115  -- 6   Pressure Injury 03/03/21 Foot Left;Lateral Stage 1 -  Intact skin with non-blanchable redness of a localized area usually over a bony prominence. 03/03/21  1115  -- 6   Wound / Incision (Open or Dehisced) 03/03/21 Other (Comment) Toe (Comment  which one) Right;Left Bilat tips of great toes with dry eschar 03/03/21  --  Toe (Comment  which one)  6            Pertinent Labs: CBC Latest Ref Rng & Units 03/09/2021 03/08/2021 03/07/2021  WBC 4.0 - 10.5 K/uL 9.7 9.8 8.9  Hemoglobin 13.0 - 17.0 g/dL 9.6(L) 9.1(L) 8.0(L)  Hematocrit 39.0 - 52.0 % 30.2(L) 28.5(L) 24.8(L)  Platelets 150 - 400 K/uL 87(L) 86(L) 88(L)    CMP Latest Ref Rng & Units 03/07/2021  03/06/2021 03/04/2021  Glucose 70 - 99 mg/dL 132(H) 139(H) 87  BUN 8 - 23 mg/dL 40(H) 41(H) 91(H)  Creatinine 0.61 - 1.24 mg/dL 1.47(H) 1.45(H) 1.55(H)  Sodium 135 - 145 mmol/L 144 146(H) 146(H)  Potassium 3.5 - 5.1 mmol/L 4.1 3.6 3.9  Chloride 98 - 111 mmol/L 118(H) 120(H) 122(H)  CO2 22 - 32 mmol/L 20(L) 22 20(L)  Calcium 8.9 - 10.3 mg/dL 7.7(L) 7.8(L) 7.9(L)  Total Protein 6.5 - 8.1 g/dL - - -  Total Bilirubin 0.3 - 1.2 mg/dL - - -  Alkaline Phos 38 - 126 U/L - - -  AST 15 - 41 U/L - - -  ALT 0 - 44 U/L - - -    Recent Labs    03/09/21 0418 03/09/21 0801 03/09/21 1119  GLUCAP 101* 91 89     Pertinent Imaging: No results found.  ASSESSMENT/PLAN:  Assessment: Principal Problem:   Symptomatic anemia Active Problems:   CAD (coronary artery disease)   Atrial flutter (HCC)   Dysphagia   Protein malnutrition (HCC)   Gastrointestinal hemorrhage with melena   Protein-calorie malnutrition, severe   Pressure injury of skin   Plan: Symptomatic anemia 2/2 suspected upper GI bleed Patient's hemoglobin is stable today at 9.6.  There is no obvious signs of bleeding at this time.   -Continue PPI per tube -Trending CBC every morning -Transfusion if < 7   Thrombocytopenia Thrombocytopenia likely secondary to malnutrition in the setting of G-tube nutrition.  -Trend CBC -We will get immature platelet fraction tomorrow morning.   Dysphagia s/p PEG Pt has a history of oropharyngeal dysphagia since July 2022. Got a PEG tube placed on 1/10.   -Appreciate RD recommendations -Continue free water flushes to prevent dehydration   History of a flutter S/p TAVR in 2020 Patient has no obvious signs of bleeding.  Considering his history of TAVR and atrial flutter, his risk of stroke is high.  We will need to restart Eliquis in the near future. -Continue holding Eliquis   Hypothyroidism -Continue Synthroid 75 mcg  Best Practice: Diet: NPO (G-tube) IVF: tube feeds VTE: SCDs Start:  03/02/21 1508 Code: Full AB: none Therapy Recs: CIR, DME: none Family Contact: at bedside DISPO: Anticipated discharge in 2-3 days to Rehab pending Medical stability.  Signature: Lawerance Cruel, D.O.  Internal Medicine Resident, PGY-3 [facility]  Pager: [phone] 1:45 PM, 03/09/2021   Please contact the on call pager after 5 pm and on weekends  at [phone].

## 2021-03-09 NOTE — Progress Notes (Incomplete)
HD#7 Subjective:  Overnight Events: None  Patient was seen at bedside this AM with daughter and son present. Patient states that he is unsure of when the last time he had a bowel movement.  He reports that he is resting well.  Discussed with family that we are reaching out to IR about fasterner at PEG tube site. No other complaints or concerns at this time.  Objective:  Vital signs in last 24 hours: Vitals:   03/08/21 2358 03/09/21 0418 03/09/21 0652 03/09/21 0800  BP:  (!) 124/58  (!) 112/51  Pulse: 69 72  77  Resp: (!) 21 19  20   Temp:  98 F (36.7 C)  97.7 F (36.5 C)  TempSrc:  Oral  Oral  SpO2: 95% 99%  100%  Weight:   67.1 kg   Height:       Supplemental O2: Room Air SpO2: 100 %   Physical Exam:  Physical Exam Constitutional:      General: He is not in acute distress.    Appearance: He is ill-appearing.  Eyes:     General:        Right eye: No discharge.        Left eye: No discharge.     Conjunctiva/sclera: Conjunctivae normal.  Cardiovascular:     Rate and Rhythm: Normal rate and regular rhythm.  Pulmonary:     Effort: Pulmonary effort is normal. No respiratory distress.  Abdominal:     Palpations: Abdomen is soft.     Comments: PEG tube in place.  Appears clean and noninfected  Skin:    General: Skin is dry.  Neurological:     Mental Status: He is alert.    Filed Weights   03/07/21 0406 03/08/21 0403 03/09/21 0652  Weight: 71.7 kg 66.7 kg 67.1 kg     Intake/Output Summary (Last 24 hours) at 03/09/2021 0920 Last data filed at 03/09/2021 0651 Gross per 24 hour  Intake 355 ml  Output 1510 ml  Net -1155 ml    Net IO Since Admission: -593.18 mL [03/09/21 0920]  Pertinent Labs: CBC Latest Ref Rng & Units 03/09/2021 03/08/2021 03/07/2021  WBC 4.0 - 10.5 K/uL 9.7 9.8 8.9  Hemoglobin 13.0 - 17.0 g/dL 9.6(L) 9.1(L) 8.0(L)  Hematocrit 39.0 - 52.0 % 30.2(L) 28.5(L) 24.8(L)  Platelets 150 - 400 K/uL 87(L) 86(L) 88(L)    CMP Latest Ref Rng & Units  03/07/2021 03/06/2021 03/04/2021  Glucose 70 - 99 mg/dL 132(H) 139(H) 87  BUN 8 - 23 mg/dL 40(H) 41(H) 91(H)  Creatinine 0.61 - 1.24 mg/dL 1.47(H) 1.45(H) 1.55(H)  Sodium 135 - 145 mmol/L 144 146(H) 146(H)  Potassium 3.5 - 5.1 mmol/L 4.1 3.6 3.9  Chloride 98 - 111 mmol/L 118(H) 120(H) 122(H)  CO2 22 - 32 mmol/L 20(L) 22 20(L)  Calcium 8.9 - 10.3 mg/dL 7.7(L) 7.8(L) 7.9(L)  Total Protein 6.5 - 8.1 g/dL - - -  Total Bilirubin 0.3 - 1.2 mg/dL - - -  Alkaline Phos 38 - 126 U/L - - -  AST 15 - 41 U/L - - -  ALT 0 - 44 U/L - - -    Imaging: No results found.  Assessment/Plan:   Principal Problem:   Symptomatic anemia Active Problems:   CAD (coronary artery disease)   Atrial flutter (HCC)   Dysphagia   Protein malnutrition (HCC)   Gastrointestinal hemorrhage with melena   Protein-calorie malnutrition, severe   Pressure injury of skin   Patient Summary: [name]  is a 88 y.o. male with a PMHx of aortic stenosis, paroxysmal atrial fibrillation on Eliquis, atrioventricular complete heart block status post permanent pacemaker implantation, HLD, hypothyroidism, iron deficiency, and prior COVID infection in June 2022 who presents to the ED today with a CC of dizziness and near syncope, found to have symptomatic anemia likely secondary to GI bleed.  Symptomatic anemia 2/2 suspected upper GI bleed Hemoglobin at 8.0 this morning after receiving one unit of blood yesterday.  Family has spoken to palliative care team and decided to continue with full scope of treatment. Clinically, he is ready for discharge. However he is high risk for rebleeding. Family originally planned [facility] with outpatient Hgb checks, but patient now wishes to pursue rehab. It appears that he is a candidate for CIR.  We we will reach out to CIR today to come talk to the patient per family request.   -Continue sucralfate, switched IV Protonix to oral suspension per tube today -CBC in AM -Transfusion goal of greater than  7 -PT/OT eval  Thrombocytopenia Review of records shows mild thrombocytopenia in the low 100s in 2021. Platelets have been downtrending, now at 95 today.  Peripheral smear showed thrombocytopenia but no other concerning findings just as schistocytes.  Likely 2/2 poor nutrition given that patient does not have liver disease does not taking any offending drugs.  Will CTM at this time and recommend outpatient work-up otherwise. -Trend CBC   Dysphagia s/p PEG Pt has a history of oropharyngeal dysphagia since July 2022. Got a PEG tube placed on 1/10.   -Appreciate RD recommendations -Continue free water flushes to prevent dehydration  History of a flutter S/p TAVR in 2020 Patient had a prosthetic valve placed in 2020 at [facility].  He is also status post pacemaker placement due to complete heart block. Previously discussed risks and benefits of holding Eliquis. Family verbalized understanding and state that they might resume Eliquis a few weeks after discharge to allow for healing. -Continue holding Eliquis  Hypothyroidism TSH was 3.2.  Patient is on Synthroid at home. -Continue Synthroid 75 mcg   Hyperlipidemia -Continue home atorvastatin   Diet: NPO IVF: Tube feeds VTE: SCDs Code: Full PT/OT recs: CIR  Dispo: Anticipated discharge pending placement, potentially CIR  Eddis Pingleton, Joellen Jersey, DO 03/09/2021, 9:20 AM Pager: [phone]  Please contact the on call pager after 5 pm and on weekends at [phone].

## 2021-03-09 NOTE — Progress Notes (Signed)
Nutrition Follow-up  DOCUMENTATION CODES:   Severe malnutrition in context of chronic illness  INTERVENTION:   Continue bolus TF regimen via PEG:  10 AM: Osmolite 1.5 474 ml (2 cartons); 105 ml free water flush before and after 4 PM: Osmolite 1.5 355 ml (1.5 cartons); 120 ml free water flush before and after 7 PM: Osmolite 1.5 355 ml (1.5 cartons); 120 ml free water flush before and after  Total intake: 1775 kcal, 75 gm protein, 1595 ml free water daily.  Free water flushes 60 ml QID with medications for a total of 1835 ml free water per day.   Recommend scheduled bowel regimen, no BM documented since 1/19.  NUTRITION DIAGNOSIS:   Severe Malnutrition related to chronic illness (chronic dysphagia, afib) as evidenced by severe fat depletion, severe muscle depletion.  Ongoing  GOAL:   Patient will meet greater than or equal to 90% of their needs  Met with TF  MONITOR:   Labs, Weight trends  REASON FOR ASSESSMENT:   Consult Enteral/tube feeding initiation and management  ASSESSMENT:   Pt admitted with dizziness and near syncope secondary to symptomatic anemia, suspicion of upper GI bleed. PMH includes aortic stenosis, afib, atrioventricular complete heart block s/p PPM, HLD, hypothyroidism, iron deficiency, prior COVID in June 2022. Pt with history of dysphagia since June where he was admitted to [facility] s/p outpatient speech therapy. He then went to [facility] in [facility] where he failed a MBS, had Cortrak placed and then had PEG tube placed 01/10 for long term feeding.  Patient is tolerating new TF regimen well per discussion with RN. Schedule was written out by RD for daughter to take home on 1/24.  Labs reviewed.  CBG: [phone]  Medications reviewed and include Protonix, Carafate.  Admission weight 68 kg Current weight 67.1 kg  No BM since 1/19, consider scheduled bowel regimen.  Diet Order:   Diet Order             Diet NPO time specified  Diet effective  now                   EDUCATION NEEDS:   No education needs have been identified at this time  Skin:  Skin Assessment: Skin Integrity Issues: Skin Integrity Issues:: Other (Comment), Stage I, DTI DTI: L heel Stage I: R heel, R & L foot Other: wound R&L toe, PI sacrum  Last BM:  1/19  Height:   Ht Readings from Last 1 Encounters:  03/02/21 _0  (1.727 m)    Weight:   Wt Readings from Last 1 Encounters:  03/09/21 67.1 kg    BMI:  Body mass index is 22.5 kg/m.  Estimated Nutritional Needs:   Kcal:  1650-1850  Protein:  85-100g  Fluid:  >/=1.65L    Lucas Mallow RD, LDN, CNSC Please refer to Amion for contact information.

## 2021-03-10 ENCOUNTER — Other Ambulatory Visit: Payer: Self-pay

## 2021-03-10 ENCOUNTER — Inpatient Hospital Stay (HOSPITAL_COMMUNITY)
Admission: RE | Admit: 2021-03-10 | Discharge: 2021-03-31 | DRG: 945 | Disposition: A | Discharge status: Other Acute Inpatient Hospital | Payer: Medicare Other | Source: Intra-hospital | Attending: Physical Medicine and Rehabilitation | Admitting: Physical Medicine and Rehabilitation

## 2021-03-10 ENCOUNTER — Encounter (HOSPITAL_COMMUNITY): Payer: Self-pay | Admitting: Physical Medicine and Rehabilitation

## 2021-03-10 DIAGNOSIS — R1310 Dysphagia, unspecified: Secondary | ICD-10-CM | POA: Diagnosis not present

## 2021-03-10 DIAGNOSIS — E43 Unspecified severe protein-calorie malnutrition: Secondary | ICD-10-CM | POA: Diagnosis present

## 2021-03-10 DIAGNOSIS — K921 Melena: Secondary | ICD-10-CM | POA: Diagnosis present

## 2021-03-10 DIAGNOSIS — R7989 Other specified abnormal findings of blood chemistry: Secondary | ICD-10-CM | POA: Diagnosis not present

## 2021-03-10 DIAGNOSIS — R5381 Other malaise: Secondary | ICD-10-CM | POA: Diagnosis present

## 2021-03-10 DIAGNOSIS — R2681 Unsteadiness on feet: Secondary | ICD-10-CM | POA: Diagnosis present

## 2021-03-10 DIAGNOSIS — L89896 Pressure-induced deep tissue damage of other site: Secondary | ICD-10-CM | POA: Diagnosis present

## 2021-03-10 DIAGNOSIS — M62838 Other muscle spasm: Secondary | ICD-10-CM | POA: Diagnosis not present

## 2021-03-10 DIAGNOSIS — G253 Myoclonus: Secondary | ICD-10-CM | POA: Diagnosis not present

## 2021-03-10 DIAGNOSIS — I2510 Atherosclerotic heart disease of native coronary artery without angina pectoris: Secondary | ICD-10-CM | POA: Diagnosis present

## 2021-03-10 DIAGNOSIS — E785 Hyperlipidemia, unspecified: Secondary | ICD-10-CM | POA: Diagnosis present

## 2021-03-10 DIAGNOSIS — Z515 Encounter for palliative care: Secondary | ICD-10-CM | POA: Diagnosis not present

## 2021-03-10 DIAGNOSIS — H9190 Unspecified hearing loss, unspecified ear: Secondary | ICD-10-CM | POA: Diagnosis present

## 2021-03-10 DIAGNOSIS — R059 Cough, unspecified: Secondary | ICD-10-CM | POA: Diagnosis present

## 2021-03-10 DIAGNOSIS — I129 Hypertensive chronic kidney disease with stage 1 through stage 4 chronic kidney disease, or unspecified chronic kidney disease: Secondary | ICD-10-CM | POA: Diagnosis present

## 2021-03-10 DIAGNOSIS — N1831 Chronic kidney disease, stage 3a: Secondary | ICD-10-CM | POA: Diagnosis present

## 2021-03-10 DIAGNOSIS — R471 Dysarthria and anarthria: Secondary | ICD-10-CM | POA: Diagnosis present

## 2021-03-10 DIAGNOSIS — L89159 Pressure ulcer of sacral region, unspecified stage: Secondary | ICD-10-CM | POA: Diagnosis present

## 2021-03-10 DIAGNOSIS — Z6835 Body mass index (BMI) 35.0-35.9, adult: Secondary | ICD-10-CM

## 2021-03-10 DIAGNOSIS — U071 COVID-19: Secondary | ICD-10-CM | POA: Diagnosis not present

## 2021-03-10 DIAGNOSIS — N390 Urinary tract infection, site not specified: Secondary | ICD-10-CM | POA: Diagnosis not present

## 2021-03-10 DIAGNOSIS — Z931 Gastrostomy status: Secondary | ICD-10-CM | POA: Diagnosis not present

## 2021-03-10 DIAGNOSIS — R1312 Dysphagia, oropharyngeal phase: Secondary | ICD-10-CM | POA: Diagnosis not present

## 2021-03-10 DIAGNOSIS — R1319 Other dysphagia: Secondary | ICD-10-CM | POA: Diagnosis not present

## 2021-03-10 DIAGNOSIS — L89611 Pressure ulcer of right heel, stage 1: Secondary | ICD-10-CM | POA: Diagnosis present

## 2021-03-10 DIAGNOSIS — Z952 Presence of prosthetic heart valve: Secondary | ICD-10-CM | POA: Diagnosis not present

## 2021-03-10 DIAGNOSIS — E039 Hypothyroidism, unspecified: Secondary | ICD-10-CM | POA: Diagnosis present

## 2021-03-10 DIAGNOSIS — N179 Acute kidney failure, unspecified: Secondary | ICD-10-CM | POA: Diagnosis present

## 2021-03-10 DIAGNOSIS — I4811 Longstanding persistent atrial fibrillation: Secondary | ICD-10-CM | POA: Diagnosis not present

## 2021-03-10 DIAGNOSIS — R627 Adult failure to thrive: Secondary | ICD-10-CM | POA: Diagnosis present

## 2021-03-10 DIAGNOSIS — R058 Other specified cough: Secondary | ICD-10-CM

## 2021-03-10 DIAGNOSIS — Z7901 Long term (current) use of anticoagulants: Secondary | ICD-10-CM

## 2021-03-10 DIAGNOSIS — T361X5A Adverse effect of cephalosporins and other beta-lactam antibiotics, initial encounter: Secondary | ICD-10-CM | POA: Diagnosis not present

## 2021-03-10 DIAGNOSIS — Z880 Allergy status to penicillin: Secondary | ICD-10-CM

## 2021-03-10 DIAGNOSIS — D72829 Elevated white blood cell count, unspecified: Secondary | ICD-10-CM

## 2021-03-10 DIAGNOSIS — N183 Chronic kidney disease, stage 3 unspecified: Secondary | ICD-10-CM | POA: Diagnosis present

## 2021-03-10 DIAGNOSIS — D62 Acute posthemorrhagic anemia: Secondary | ICD-10-CM | POA: Diagnosis present

## 2021-03-10 DIAGNOSIS — I482 Chronic atrial fibrillation, unspecified: Secondary | ICD-10-CM | POA: Diagnosis present

## 2021-03-10 DIAGNOSIS — Z789 Other specified health status: Secondary | ICD-10-CM | POA: Diagnosis not present

## 2021-03-10 DIAGNOSIS — Z79899 Other long term (current) drug therapy: Secondary | ICD-10-CM

## 2021-03-10 DIAGNOSIS — Y92239 Unspecified place in hospital as the place of occurrence of the external cause: Secondary | ICD-10-CM | POA: Diagnosis not present

## 2021-03-10 DIAGNOSIS — Z8249 Family history of ischemic heart disease and other diseases of the circulatory system: Secondary | ICD-10-CM

## 2021-03-10 DIAGNOSIS — K5900 Constipation, unspecified: Secondary | ICD-10-CM | POA: Diagnosis not present

## 2021-03-10 DIAGNOSIS — Z9842 Cataract extraction status, left eye: Secondary | ICD-10-CM

## 2021-03-10 DIAGNOSIS — K922 Gastrointestinal hemorrhage, unspecified: Secondary | ICD-10-CM

## 2021-03-10 DIAGNOSIS — L89626 Pressure-induced deep tissue damage of left heel: Secondary | ICD-10-CM | POA: Diagnosis present

## 2021-03-10 DIAGNOSIS — I4892 Unspecified atrial flutter: Secondary | ICD-10-CM | POA: Diagnosis present

## 2021-03-10 DIAGNOSIS — Z9841 Cataract extraction status, right eye: Secondary | ICD-10-CM

## 2021-03-10 DIAGNOSIS — I4821 Permanent atrial fibrillation: Secondary | ICD-10-CM | POA: Diagnosis present

## 2021-03-10 DIAGNOSIS — Z85828 Personal history of other malignant neoplasm of skin: Secondary | ICD-10-CM

## 2021-03-10 DIAGNOSIS — Z888 Allergy status to other drugs, medicaments and biological substances status: Secondary | ICD-10-CM

## 2021-03-10 DIAGNOSIS — R1313 Dysphagia, pharyngeal phase: Secondary | ICD-10-CM | POA: Diagnosis present

## 2021-03-10 DIAGNOSIS — E875 Hyperkalemia: Secondary | ICD-10-CM | POA: Diagnosis present

## 2021-03-10 DIAGNOSIS — Z79890 Hormone replacement therapy (postmenopausal): Secondary | ICD-10-CM

## 2021-03-10 DIAGNOSIS — K222 Esophageal obstruction: Secondary | ICD-10-CM | POA: Diagnosis present

## 2021-03-10 DIAGNOSIS — Z974 Presence of external hearing-aid: Secondary | ICD-10-CM

## 2021-03-10 DIAGNOSIS — G629 Polyneuropathy, unspecified: Secondary | ICD-10-CM | POA: Diagnosis present

## 2021-03-10 DIAGNOSIS — Z950 Presence of cardiac pacemaker: Secondary | ICD-10-CM

## 2021-03-10 DIAGNOSIS — I959 Hypotension, unspecified: Secondary | ICD-10-CM | POA: Diagnosis not present

## 2021-03-10 DIAGNOSIS — I483 Typical atrial flutter: Secondary | ICD-10-CM | POA: Diagnosis not present

## 2021-03-10 DIAGNOSIS — Z806 Family history of leukemia: Secondary | ICD-10-CM

## 2021-03-10 LAB — CBC
HCT: 28.2 % — ABNORMAL LOW (ref 39.0–52.0)
Hemoglobin: 8.9 g/dL — ABNORMAL LOW (ref 13.0–17.0)
MCH: 31.2 pg (ref 26.0–34.0)
MCHC: 31.6 g/dL (ref 30.0–36.0)
MCV: 98.9 fL (ref 80.0–100.0)
Platelets: 93 10*3/uL — ABNORMAL LOW (ref 150–400)
RBC: 2.85 MIL/uL — ABNORMAL LOW (ref 4.22–5.81)
RDW: 18.3 % — ABNORMAL HIGH (ref 11.5–15.5)
WBC: 8.1 10*3/uL (ref 4.0–10.5)
nRBC: 0 % (ref 0.0–0.2)

## 2021-03-10 LAB — GLUCOSE, CAPILLARY
Glucose-Capillary: 104 mg/dL — ABNORMAL HIGH (ref 70–99)
Glucose-Capillary: 104 mg/dL — ABNORMAL HIGH (ref 70–99)
Glucose-Capillary: 104 mg/dL — ABNORMAL HIGH (ref 70–99)
Glucose-Capillary: 131 mg/dL — ABNORMAL HIGH (ref 70–99)

## 2021-03-10 LAB — BASIC METABOLIC PANEL
Anion gap: 6 (ref 5–15)
BUN: 37 mg/dL — ABNORMAL HIGH (ref 8–23)
CO2: 25 mmol/L (ref 22–32)
Calcium: 7.9 mg/dL — ABNORMAL LOW (ref 8.9–10.3)
Chloride: 107 mmol/L (ref 98–111)
Creatinine, Ser: 1.35 mg/dL — ABNORMAL HIGH (ref 0.61–1.24)
GFR, Estimated: 47 mL/min — ABNORMAL LOW (ref >60)
Glucose, Bld: 152 mg/dL — ABNORMAL HIGH (ref 70–99)
Potassium: 4.7 mmol/L (ref 3.5–5.1)
Sodium: 138 mmol/L (ref 135–145)

## 2021-03-10 LAB — PHOSPHORUS: Phosphorus: 2.4 mg/dL — ABNORMAL LOW (ref 2.5–4.6)

## 2021-03-10 LAB — IMMATURE PLATELET FRACTION: Immature Platelet Fraction: 3.5 % (ref 1.2–8.6)

## 2021-03-10 MED ORDER — GUAIFENESIN-DM 100-10 MG/5ML PO SYRP
5.0000 mL | ORAL_SOLUTION | Freq: Two times a day (BID) | ORAL | Status: DC
  Administered 2021-03-10: 5 mL via ORAL
  Filled 2021-03-10: qty 5

## 2021-03-10 MED ORDER — PROCHLORPERAZINE EDISYLATE 10 MG/2ML IJ SOLN: 5.0000 mg | Freq: Four times a day (QID) | INTRAMUSCULAR | Status: DC | PRN

## 2021-03-10 MED ORDER — LEVOTHYROXINE SODIUM 75 MCG PO TABS
75.0000 ug | ORAL_TABLET | Freq: Every day | ORAL | Status: DC
  Administered 2021-03-11 – 2021-03-31 (×21): 75 ug
  Filled 2021-03-10 (×21): qty 1

## 2021-03-10 MED ORDER — GUAIFENESIN-DM 100-10 MG/5ML PO SYRP
5.0000 mL | ORAL_SOLUTION | Freq: Two times a day (BID) | ORAL | Status: AC
  Administered 2021-03-10 – 2021-03-11 (×2): 5 mL via ORAL
  Filled 2021-03-10 (×3): qty 10

## 2021-03-10 MED ORDER — ALUM & MAG HYDROXIDE-SIMETH 200-200-20 MG/5ML PO SUSP: 30.0000 mL | ORAL | Status: DC | PRN

## 2021-03-10 MED ORDER — PROCHLORPERAZINE MALEATE 5 MG PO TABS: 5.0000 mg | ORAL_TABLET | Freq: Four times a day (QID) | ORAL | Status: DC | PRN

## 2021-03-10 MED ORDER — POLYETHYLENE GLYCOL 3350 17 G PO PACK: 17.0000 g | PACK | Freq: Every day | ORAL | 0 refills | Status: DC | PRN

## 2021-03-10 MED ORDER — PROCHLORPERAZINE 25 MG RE SUPP: 12.5000 mg | Freq: Four times a day (QID) | RECTAL | Status: DC | PRN

## 2021-03-10 MED ORDER — ATORVASTATIN CALCIUM 40 MG PO TABS
40.0000 mg | ORAL_TABLET | Freq: Every day | ORAL | Status: DC
Start: 2021-03-10 — End: 2021-03-31
  Administered 2021-03-10 – 2021-03-30 (×21): 40 mg
  Filled 2021-03-10 (×21): qty 1

## 2021-03-10 MED ORDER — FLEET ENEMA 7-19 GM/118ML RE ENEM
1.0000 | ENEMA | Freq: Once | RECTAL | Status: AC | PRN
  Administered 2021-03-26: 1 via RECTAL
  Filled 2021-03-10: qty 1

## 2021-03-10 MED ORDER — BISACODYL 10 MG RE SUPP: 10.0000 mg | Freq: Every day | RECTAL | Status: DC | PRN

## 2021-03-10 MED ORDER — ACETAMINOPHEN 325 MG PO TABS
325.0000 mg | ORAL_TABLET | ORAL | Status: DC | PRN
  Administered 2021-03-18 – 2021-03-30 (×2): 650 mg
  Filled 2021-03-10 (×2): qty 2

## 2021-03-10 MED ORDER — TRAZODONE HCL 50 MG PO TABS
25.0000 mg | ORAL_TABLET | Freq: Every evening | ORAL | Status: DC | PRN
  Administered 2021-03-11 – 2021-03-14 (×2): 50 mg
  Filled 2021-03-10 (×2): qty 1

## 2021-03-10 MED ORDER — FREE WATER
200.0000 mL | Freq: Four times a day (QID) | Status: DC
Start: 2021-03-11 — End: 2021-03-13
  Administered 2021-03-11 – 2021-03-13 (×11): 200 mL

## 2021-03-10 MED ORDER — POLYETHYLENE GLYCOL 3350 17 G PO PACK
17.0000 g | PACK | Freq: Every day | ORAL | Status: AC
Start: 2021-03-10 — End: 2021-03-10
  Administered 2021-03-10: 17 g
  Filled 2021-03-10: qty 1

## 2021-03-10 MED ORDER — PANTOPRAZOLE SODIUM 40 MG PO PACK
40.0000 mg | PACK | Freq: Every day | ORAL | 2 refills | Status: DC
Start: 2021-03-10 — End: 2021-03-31

## 2021-03-10 MED ORDER — SENNA 8.6 MG PO TABS: 1.0000 | ORAL_TABLET | Freq: Every evening | ORAL | 0 refills | Status: DC | PRN

## 2021-03-10 MED ORDER — PANTOPRAZOLE 2 MG/ML SUSPENSION
40.0000 mg | Freq: Every day | ORAL | Status: DC
  Administered 2021-03-11 – 2021-03-14 (×4): 40 mg
  Filled 2021-03-10 (×4): qty 20

## 2021-03-10 MED ORDER — SENNOSIDES-DOCUSATE SODIUM 8.6-50 MG PO TABS
2.0000 | ORAL_TABLET | Freq: Every day | ORAL | Status: DC
  Administered 2021-03-10 – 2021-03-30 (×21): 2
  Filled 2021-03-10 (×21): qty 2

## 2021-03-10 MED ORDER — SENNOSIDES-DOCUSATE SODIUM 8.6-50 MG PO TABS
1.0000 | ORAL_TABLET | Freq: Once | ORAL | Status: AC
  Administered 2021-03-10: 1
  Filled 2021-03-10: qty 1

## 2021-03-10 MED ORDER — DIPHENHYDRAMINE HCL 12.5 MG/5ML PO ELIX: 12.5000 mg | ORAL_SOLUTION | Freq: Four times a day (QID) | ORAL | Status: DC | PRN

## 2021-03-10 MED ORDER — GUAIFENESIN-DM 100-10 MG/5ML PO SYRP: 5.0000 mL | ORAL_SOLUTION | Freq: Two times a day (BID) | ORAL | 0 refills | Status: DC

## 2021-03-10 MED ORDER — POLYETHYLENE GLYCOL 3350 17 G PO PACK: 17.0000 g | PACK | Freq: Every day | ORAL | Status: DC | PRN

## 2021-03-10 MED ORDER — SUCRALFATE 1 GM/10ML PO SUSP
1.0000 g | Freq: Three times a day (TID) | ORAL | Status: DC
  Administered 2021-03-11 – 2021-03-31 (×63): 1 g
  Filled 2021-03-10 (×66): qty 10

## 2021-03-10 MED ORDER — OSMOLITE 1.5 CAL PO LIQD
474.0000 mL | Freq: Every day | ORAL | Status: DC
Start: 2021-03-11 — End: 2021-03-13
  Administered 2021-03-11 – 2021-03-13 (×3): 474 mL
  Filled 2021-03-10 (×3): qty 1000

## 2021-03-10 MED ORDER — GUAIFENESIN-DM 100-10 MG/5ML PO SYRP: 5.0000 mL | ORAL_SOLUTION | Freq: Four times a day (QID) | ORAL | Status: DC | PRN

## 2021-03-10 MED ORDER — OSMOLITE 1.5 CAL PO LIQD
355.0000 mL | Freq: Two times a day (BID) | ORAL | Status: DC
Start: 2021-03-11 — End: 2021-03-13
  Administered 2021-03-11 – 2021-03-13 (×5): 355 mL
  Filled 2021-03-10 (×6): qty 1000

## 2021-03-10 NOTE — Discharge Summary (Signed)
Name: Gregory [name] MRN: [id number] DOB: 09-14-1920 88 y.o. PCP: Orpah Greek, DO  Date of Admission: 03/02/2021 10:26 AM Date of Discharge:  03/10/2021 Attending Physician: Dr.  Saverio Danker  DISCHARGE DIAGNOSIS:  Primary Problem: Symptomatic anemia   Hospital Problems: Principal Problem:   Symptomatic anemia Active Problems:   CAD (coronary artery disease)   Atrial flutter (HCC)   Dysphagia   Protein malnutrition (HCC)   Gastrointestinal hemorrhage with melena   Protein-calorie malnutrition, severe   Pressure injury of skin    DISCHARGE MEDICATIONS:   Allergies as of 03/10/2021       Reactions   Amlodipine Besylate    Other reaction(s): edema   Penicillins Hives, Rash        Medication List     STOP taking these medications    clotrimazole 10 MG troche Commonly known as: MYCELEX   Eliquis 2.5 MG Tabs tablet Generic drug: apixaban       TAKE these medications    atorvastatin 40 MG tablet Commonly known as: LIPITOR Take 40 mg by mouth at bedtime.   CALCIUM PO Take 1 tablet by mouth daily.   folic acid 1 MG tablet Commonly known as: FOLVITE Take 1 mg by mouth daily.   guaiFENesin-dextromethorphan 100-10 MG/5ML syrup Commonly known as: ROBITUSSIN DM Take 5 mLs by mouth 2 (two) times daily.   levothyroxine 75 MCG tablet Commonly known as: SYNTHROID Take 75 mcg by mouth daily.   multivitamin tablet Take 1 tablet by mouth daily.   NIACIN PO Take 1 tablet by mouth daily.   NUTREN 1.5 EN 250 mLs by Enteral route in the morning, at noon, in the evening, and at bedtime.   pantoprazole sodium 40 mg Commonly known as: PROTONIX Place 40 mg into feeding tube daily.   sucralfate 1 GM/10ML suspension Commonly known as: CARAFATE Place 1 g into feeding tube every 6 (six) hours.   vitamin B-12 1000 MCG tablet Commonly known as: CYANOCOBALAMIN Take 1,000 mcg by mouth daily.   vitamin C 500 MG tablet Commonly known as: ASCORBIC ACID Take  500 mg by mouth daily.   VITAMIN D3 PO Take 1 tablet by mouth daily.               Durable Medical Equipment  (From admission, onward)           Start     Ordered   03/06/21 1147  For home use only DME Hospital bed  Once       Question Answer Comment  Length of Need Lifetime   Patient has (list medical condition): dysphagia w/ PEG placement, Pacemaker, Afib, deconditioned   The above medical condition requires: Patient requires the ability to reposition frequently   Head must be elevated greater than: 45 degrees   Bed type Semi-electric   Support Surface: Alternating Pressure Pad and Pump      03/06/21 1150              Discharge Care Instructions  (From admission, onward)           Start     Ordered   03/10/21 0000  Discharge wound care:       Comments: Pressure pads o feet and sacrum   03/10/21 1354            DISPOSITION AND FOLLOW-UP:  GregoryGregory [name] was discharged from [facility] in [place] condition. At the hospital follow up visit please address:  Follow-up Recommendations: Consults: GI Labs:  CBC and Comprehensive Metabolic Panel Studies: none Medications: Eliquis was held during hospitalization. Bowel regimen started (Miralax and Senokot)  Follow-up Appointments:  Follow-up Information     [facility] Follow up.   Contact information: [place] [phone]        Care, [facility] Follow up.   Why: HHRN/PT/OT services resumed- they will contact you to schedule Contact information: [place] [address] [facility] Follow up.   Why: Hospital bed arranged- they will contact you to deliver to the home Contact information: [place] [address] [phone]         Gregory Larsson, MD. Schedule an appointment as soon as possible for a visit.   Specialty:  Gastroenterology Contact information: [place] [address] [phone]                 HOSPITAL COURSE:  Patient Summary: Gregory [name] is a 88 y.o. male with a PMHx of aortic stenosis, paroxysmal atrial fibrillation on Eliquis, atrioventricular complete heart block status post permanent pacemaker implantation, HLD, hypothyroidism, iron deficiency, and prior COVID infection in June 2022 who presents to the ED today with a CC of dizziness and near syncope, found to have symptomatic anemia likely secondary to GI bleed.   Symptomatic anemia Hemorrhagic shock Background, discussion with daughter and chart review: Pt has a history of oropharyngeal dysphagia since July 2022. He was previously evaluated for this at [facility] and is s/p outpatient speech therapy with some improvement. Presented to GI outpatient at [facility] in [facility] for a second opinion. He failed barium swallow there and was sent to the hospital as a direct admit for malnutrition secondary to dysphagia. GI was consulted and pt received a Corpak. Afterwards, patient's hemoglobin was less than 7 on two separate occasions, received blood transfusions and started on sucralfate and protonix (endorsed melena). On 1/10, got a PEG tube placed.   The patient presented here after an episode of dizziness while lying in bed.  He denied dark, black, or bloody stools although notes that he had not had a bowel movement in the past 2 weeks since discharge from prior hospitalization at [facility].  Initial blood pressure 90/30 with MAP of 53 on admission. Hgb of 5.9. The patient has received fluid boluses and packed red blood cells with improvement of MAP and hemoglobin.   Suspected GI bleed with positive FOBT. Specifically, there was a concern for upper GI bleed after EGD and PEG placement during his last hospitalization at [facility].  GI was consulted here.  They recommended supportive care rather than EGD.  The following day, the  patient's condition was discussed at length as well as the risks and benefits of EGD versus watchful waiting.  The family chose watchful waiting and also decided to hold Eliquis for the time being.  Otherwise, we will continue Protonix and monitor his hemoglobin throughout his hospitalization.  The patient was given blood products as needed to maintain hemoglobin above 7.  Initially, the patient wished to pursue home health and monitor patient's hemoglobin as outpatient with his PCP at [facility].  However, patient later expressed desire to pursue rehab in a facility, SNF versus CIR.  Therefore, PT/OT was consulted for their recommendations. Family meeting was held to discuss restarting eliquis. The risk and benefits were discussed in detail regarding risk for GI bleed and CVA. Patient wanted  to hold off in the short term. Patient will be discharged to in patient rehab. The Rehab physician was made aware of this problem.    Dysphagia s/p PEG Pt has a history of oropharyngeal dysphagia since July 2022. Admitted to [facility] in [facility] for malnutrition secondary to dysphagia. GI was consulted and pt received a Corpak. On 1/10, got a PEG tube placed.  We followed already recommendations throughout his hospitalization, and patient continued feeding as well as medications through his PEG tube.  On 1/25, IR was consulted due to T-fasteners with the G-tube. The fasteners were removed.   History of a flutter S/p TAVR in 2020 Patient had a prosthetic valve placed in 2020 at [facility].  Also status post pacemaker due to complete heart block.  He was on Eliquis at home for this, however this was held in the setting of GI bleed.  Patient and his family decided to hold Eliquis with plan to restart in the next few weeks.  AKI on CKD, resolved His creatinine from his last hospitalization in December was around 1.6. Creatinine on admission was 2.07.  Improved to 1.65 this morning after ministration of blood products and fluid  boluses. Renal US negative. UA unremarkable. Suspect prerenal etiology.  His BMP was trended throughout his hospitalization.   Hypothyroidism TSH was 3.2.  Patient is on Synthroid at home.  This was continued throughout his hospitalization.   Hyperlipidemia Home atorvastatin was continued throughout his hospitalization.  Constipation: - Patient will need a bowel regimen at discharge.     DISCHARGE INSTRUCTIONS:   Discharge Instructions     Diet - low sodium heart healthy   Complete by: As directed    Discharge wound care:   Complete by: As directed    Pressure pads o feet and sacrum   Increase activity slowly   Complete by: As directed        SUBJECTIVE:  Patient resting comfortably in bed.  He denies any new complaints today.  We discussed the risks and benefits of GI bleed versus stroke in regards to restarting Eliquis.  Decided to hold off on starting Eliquis at this time.   OBJECTIVE:  Discharge Vitals:   BP (!) 128/58 (BP Location: Left Arm)    Pulse 70    Temp 97.7 F (36.5 C) (Oral)    Resp 20    Ht 5\' 8"  (1.727 m)    Wt 73 kg    SpO2 94%    BMI 24.48 kg/m  Physical Exam HENT:     Head: Normocephalic and atraumatic.     Mouth/Throat:     Mouth: Mucous membranes are dry.  Eyes:     Extraocular Movements: Extraocular movements intact.  Cardiovascular:     Rate and Rhythm: Normal rate.     Pulses: Normal pulses.     Heart sounds: Normal heart sounds.  Pulmonary:     Breath sounds: Normal breath sounds.  Abdominal:     General: There is no distension.     Palpations: Abdomen is soft.     Tenderness: There is no abdominal tenderness.  Musculoskeletal:        General: No swelling. Normal range of motion.  Skin:    General: Skin is warm and dry.  Neurological:     General: No focal deficit present.     Mental Status: He is alert and oriented to person, place, and time.     Pertinent Labs, Studies, and Procedures:  CBC Latest Ref  Rng & Units 03/10/2021  03/09/2021 03/08/2021  WBC 4.0 - 10.5 K/uL 8.1 9.7 9.8  Hemoglobin 13.0 - 17.0 g/dL 8.9(L) 9.6(L) 9.1(L)  Hematocrit 39.0 - 52.0 % 28.2(L) 30.2(L) 28.5(L)  Platelets 150 - 400 K/uL 93(L) 87(L) 86(L)    CMP Latest Ref Rng & Units 03/10/2021 03/07/2021 03/06/2021  Glucose 70 - 99 mg/dL 152(H) 132(H) 139(H)  BUN 8 - 23 mg/dL 37(H) 40(H) 41(H)  Creatinine 0.61 - 1.24 mg/dL 1.35(H) 1.47(H) 1.45(H)  Sodium 135 - 145 mmol/L 138 144 146(H)  Potassium 3.5 - 5.1 mmol/L 4.7 4.1 3.6  Chloride 98 - 111 mmol/L 107 118(H) 120(H)  CO2 22 - 32 mmol/L 25 20(L) 22  Calcium 8.9 - 10.3 mg/dL 7.9(L) 7.7(L) 7.8(L)  Total Protein 6.5 - 8.1 g/dL - - -  Total Bilirubin 0.3 - 1.2 mg/dL - - -  Alkaline Phos 38 - 126 U/L - - -  AST 15 - 41 U/L - - -  ALT 0 - 44 U/L - - -    CT Head Wo Contrast  Result Date: 03/02/2021 CLINICAL DATA:  Dizziness. EXAM: CT HEAD WITHOUT CONTRAST TECHNIQUE: Contiguous axial images were obtained from the base of the skull through the vertex without intravenous contrast. RADIATION DOSE REDUCTION: This exam was performed according to the departmental dose-optimization program which includes automated exposure control, adjustment of the mA and/or kV according to patient size and/or use of iterative reconstruction technique. COMPARISON:  None. FINDINGS: Brain: No evidence of acute infarction, hemorrhage, hydrocephalus, extra-axial collection or mass lesion/mass effect. Old right cerebellar infarct. Moderate generalized cerebral atrophy, within normal limits for age. Scattered moderate periventricular and subcortical white matter hypodensities are nonspecific, but favored to reflect chronic microvascular ischemic changes. Vascular: Calcified atherosclerosis at the skull base. No hyperdense vessel. Skull: Normal. Negative for fracture or focal lesion. Sinuses/Orbits: No acute finding. Other: None. IMPRESSION: 1. No acute intracranial abnormality.  Old right cerebellar infarct. 2. Moderate atrophy and  chronic microvascular ischemic changes. Electronically Signed   By: Titus Dubin M.D.   On: 03/02/2021 12:27   US RENAL  Result Date: 03/02/2021 CLINICAL DATA:  Acute kidney injury EXAM: RENAL / URINARY TRACT ULTRASOUND COMPLETE COMPARISON:  None. FINDINGS: Right Kidney: Renal measurements: 8.4 x 5.3 x 4 cm = volume: 92.1 ML. Echogenicity increased. Query poorly visualized cystic subcentimeter lesion within the superior pole of the right kidney. No mass or hydronephrosis visualized. Left Kidney: Renal measurements: 9.6 x 5.6 x 3.8 cm = volume: 107 mL. Echogenicity increased. No mass or hydronephrosis visualized. Urinary bladder: Appears normal for degree of bladder distention. Other: None. IMPRESSION: Bilateral renal echogenicity suggestive of renal parenchymal disease. Electronically Signed   By: Iven Finn M.D.   On: 03/02/2021 17:41   DG Chest Port 1 View  Result Date: 03/02/2021 CLINICAL DATA:  Dizziness. EXAM: PORTABLE CHEST 1 VIEW COMPARISON:  07/25/2020 FINDINGS: No focal consolidation. No pleural effusion or pneumothorax. Heart and mediastinal contours are unremarkable. Cardiac pacemaker in satisfactory position. Prior TAVR. No acute osseous abnormality. IMPRESSION: No active disease. Electronically Signed   By: Kathreen Devoid M.D.   On: 03/02/2021 10:46     Signed: Lawerance Cruel, D.O.  Internal Medicine Resident, PGY-3 [facility]  Pager: [phone] 1:55 PM, 03/10/2021

## 2021-03-10 NOTE — H&P (Signed)
Physical Medicine and Rehabilitation Admission H&P    Chief Complaint  Patient presents with   Debility    HPI: Gregory [name] is a 88 year old male with history of A Flutter s/p ablation w/PPM, HTN, neuropathy, lung nodule, TVAR, HOH, dysphagia s/p G tube (placed in [place] 02/10/21) who was admitted on 03/02/21 with hypotension, dizziness, near syncope and hgb 5.9.  He was treated with fluid bolus and 2 units PRBC  with improvement of symptoms. FBOT positive with reports of dark stools as well as reports of Hgb of 7.0 and trauma to GI tract due to NGT per family.  Dr. Silverio Decamp recommended conservative management of NPO, IV PPI and transfusion as needed as heme positive stools felt to be related to NGT mucosal trauma and to hold eliquis. Palliative care consulted to discuss [place] and patient/family elected on full code/full scope of practice. PEG tube noted to be loose and T-fastener removed  by radiology today. Hgb stabilized and tube feeds resumed. Patient noted to be deconditioned with posterior lean on standing, unsteady gait and difficulty completing ADLs. CIR recommended due to functional decline. Lived with son PTA.   Review of Systems  Unable to perform ROS: Other  Constitutional:  Negative for chills and fever.  HENT:  Positive for hearing loss.   Eyes:  Negative for blurred vision and double vision.  Respiratory:  Positive for cough.        Had nebulizer Rx in the past--but did not use it.   Cardiovascular:  Negative for chest pain and palpitations.  Gastrointestinal:  Positive for heartburn.  Genitourinary:  Negative for dysuria.  Musculoskeletal:  Negative for back pain and neck pain.  Skin:  Negative for rash.  Neurological:  Positive for weakness.    Past Medical History:  Diagnosis Date   Atrial flutter [facility])    s/p ablation   Cancer [facility])    skin cancer   Cataracts, bilateral    Hypertension    Neuromuscular disorder ([place])    Pacemaker 2010   Thyroid  disease     Past Surgical History:  Procedure Laterality Date   APPENDECTOMY     COLONOSCOPY     EYE SURGERY Bilateral    cataract surgery   HERNIA REPAIR Right    INGUINAL HERNIA REPAIR Left 10/12/2013   Procedure: LEFT  INGUINAL HERNIA REPAIR;  Surgeon: Odis Hollingshead, MD;  Location: [place];  Service: General;  Laterality: Left;   INSERTION OF MESH Left 10/12/2013   Procedure: INSERTION OF MESH;  Surgeon: Odis Hollingshead, MD;  Location: [facility] OR;  Service: General;  Laterality: Left;   PEG TUBE PLACEMENT Left     Family History  Problem Relation Age of Onset   Heart disease Mother    Cancer Father        leukemia    Social History: Son lives with patient. He was independent with cane prior to Dec 2022. He reports that he has never smoked. He has never used smokeless tobacco. He reports that he does not drink alcohol and does not use drugs.   Allergies  Allergen Reactions   Amlodipine Besylate     Other reaction(s): edema   Penicillins Hives and Rash    Medications Prior to Admission  Medication Sig Dispense Refill   atorvastatin (LIPITOR) 40 MG tablet Take 40 mg by mouth at bedtime.     CALCIUM PO Take 1 tablet by mouth daily.     Cholecalciferol (VITAMIN D3  PO) Take 1 tablet by mouth daily.     folic acid (FOLVITE) 1 MG tablet Take 1 mg by mouth daily.     guaiFENesin-dextromethorphan (ROBITUSSIN DM) 100-10 MG/5ML syrup Take 5 mLs by mouth 2 (two) times daily. 118 mL 0   levothyroxine (SYNTHROID) 75 MCG tablet Take 75 mcg by mouth daily.     Multiple Vitamin (MULTIVITAMIN) tablet Take 1 tablet by mouth daily.     NIACIN PO Take 1 tablet by mouth daily.     Nutritional Supplements (NUTREN 1.5 EN) 250 mLs by Enteral route in the morning, at noon, in the evening, and at bedtime.     pantoprazole sodium (PROTONIX) 40 mg Place 40 mg into feeding tube daily. 30 each 2   polyethylene glycol (MIRALAX) 17 g packet Place 17 g into feeding tube daily as needed for mild  constipation or moderate constipation. 14 each 0   senna (SENOKOT) 8.6 MG TABS tablet Place 1 tablet (8.6 mg total) into feeding tube at bedtime as needed for mild constipation. 120 tablet 0   sucralfate (CARAFATE) 1 GM/10ML suspension Place 1 g into feeding tube every 6 (six) hours.     vitamin B-12 (CYANOCOBALAMIN) 1000 MCG tablet Take 1,000 mcg by mouth daily.      vitamin C (ASCORBIC ACID) 500 MG tablet Take 500 mg by mouth daily.      Drug Regimen Review  Drug regimen was reviewed and remains appropriate with no significant issues identified   Home: Home Living Family/patient expects to be discharged to:: Private residence Living Arrangements: Children (son) Available Help at Discharge: Family Type of Home: House Home Access: Stairs to enter Technical brewer of Steps: 2 Entrance Stairs-Rails: Right Home Layout: Two level, Able to live on main level with bedroom/bathroom, 1/2 bath on main level, Bed/bath upstairs Alternate Level Stairs-Rails: Right Bathroom Shower/Tub: Multimedia programmer: Handicapped height Bathroom Accessibility: Yes Home Equipment: [place] - single point, Grab bars - tub/shower Additional Comments: Son lives with pt; daughter nearby and does majority of household tasks   Functional History: Prior Function ADLs Comments: Does bird bathing on first floor; but handicapped accessible bathroom is upstairs   Functional Status:  Mobility: Bed Mobility Overal bed mobility: Needs Assistance Bed Mobility: Supine to Sit Supine to sit: Min assist, HOB elevated General bed mobility comments: verbal cues with instructions for technique and rail use Transfers Overall transfer level: Needs assistance Equipment used: Rolling walker (2 wheels) Transfers: Sit to/from Stand, Bed to chair/wheelchair/BSC Sit to Stand: Mod assist Bed to/from chair/wheelchair/BSC transfer type:: Step pivot Stand pivot transfers: +2 safety/equipment, +2 physical assistance,  Mod assist Step pivot transfers: Mod assist General transfer comment: RW use with mod assist for balance Ambulation/Gait Ambulation/Gait assistance: Mod assist Gait Distance (Feet): 8 Feet (+ 36') Assistive device: Rolling walker (2 wheels) Gait Pattern/deviations: Step-to pattern, Step-through pattern, Decreased stride length, Trunk flexed, Leaning posteriorly General Gait Details: Very slow, guarded, unsteady gait with RW and modA for stability, noted knee instability requiring intermittent assist to prevent LOB and manage RW; 1x seated rest at sink for ADL tasks; close chair follow for safety to allow additional seated rest Gait velocity: Decreased   ADL: ADL Overall ADL's : Needs assistance/impaired Eating/Feeding: NPO Grooming: Wash/dry hands, Wash/dry face, Oral care, Brushing hair, Minimal assistance Grooming Details (indicate cue type and reason): min assist to complete combing hair seated in recliner Upper Body Bathing: Minimal assistance, Sitting Lower Body Bathing: Maximal assistance, Sit to/from stand Upper Body Dressing :  Minimal assistance, Sitting Lower Body Dressing: Maximal assistance, Sit to/from stand Lower Body Dressing Details (indicate cue type and reason): able to doff left sock and partically for right.  Max assist to [employer]: Moderate assistance, Maximal assistance, +2 for physical assistance, +2 for safety/equipment, Stand-pivot, Rolling walker (2 wheels) (simulated to recliner) Toilet Transfer Details (indicate cue type and reason): Mod-Max A for power up from EOB. Assistance to manage RW and pivot to recliner. Performing second sit<>stand with Mod A +2 and stabilizing RW. posterior lean Functional mobility during ADLs: Minimal assistance, Moderate assistance, Rolling walker (2 wheels), +2 for physical assistance, +2 for safety/equipment General ADL Comments: grooming and LB dressing addressed seated in recliner   Cognition: Cognition Overall  Cognitive Status: Within Functional Limits for tasks assessed Orientation Level: Oriented X4 Cognition Arousal/Alertness: Awake/alert Behavior During Therapy: WFL for tasks assessed/performed Overall Cognitive Status: Within Functional Limits for tasks assessed General Comments: HOH, required step by step instructions for bed mobiltiy and tranfers.      Blood pressure (!) 135/56, pulse 75, temperature 97.8 F (36.6 C), temperature source Oral, resp. rate 19, height 5\' 8"  (1.727 m), weight 73 kg. Physical Exam Vitals and nursing note reviewed.  Constitutional:      Appearance: Normal appearance.     Comments: Weak and mildly dysarthric speech.   HEENT: hard of hearing but can hear slow speech with both hearing aids in place.  Cardiovascular:     Rate and Rhythm: Rhythm irregular.  Pulmonary:     Breath sounds: Examination of the right-upper field reveals rales. Examination of the right-middle field reveals rales. Examination of the right-lower field reveals rales. Examination of the left-lower field reveals rales. Rales present.  Abdomen: NT, ND Neurological:     Mental Status: He is alert. 5/5 strength throughout upper extremities, 4/5 throughout lower extremities.   Results for orders placed or performed during the hospital encounter of 03/02/21 (from the past 48 hour(s))  Glucose, capillary     Status: Abnormal   Collection Time: 03/08/21  8:12 PM  Result Value Ref Range   Glucose-Capillary 121 (H) 70 - 99 mg/dL    Comment: Glucose reference range applies only to samples taken after fasting for at least 8 hours.  Glucose, capillary     Status: Abnormal   Collection Time: 03/09/21 12:06 AM  Result Value Ref Range   Glucose-Capillary 132 (H) 70 - 99 mg/dL    Comment: Glucose reference range applies only to samples taken after fasting for at least 8 hours.  CBC     Status: Abnormal   Collection Time: 03/09/21  2:15 AM  Result Value Ref Range   WBC 9.7 4.0 - 10.5 K/uL   RBC  3.12 (L) 4.22 - 5.81 MIL/uL   Hemoglobin 9.6 (L) 13.0 - 17.0 g/dL   HCT 30.2 (L) 39.0 - 52.0 %   MCV 96.8 80.0 - 100.0 fL   MCH 30.8 26.0 - 34.0 pg   MCHC 31.8 30.0 - 36.0 g/dL   RDW 18.9 (H) 11.5 - 15.5 %   Platelets 87 (L) 150 - 400 K/uL    Comment: Immature Platelet Fraction may be clinically indicated, consider ordering this additional test [id number] CONSISTENT WITH PREVIOUS RESULT REPEATED TO VERIFY    nRBC 0.0 0.0 - 0.2 %    Comment: Performed at [facility], [place] [address], [place], [place] [address]  Glucose, capillary     Status: Abnormal   Collection Time: 03/09/21  4:18 AM  Result Value Ref Range   Glucose-Capillary 101 (H) 70 - 99 mg/dL    Comment: Glucose reference range applies only to samples taken after fasting for at least 8 hours.  Glucose, capillary     Status: None   Collection Time: 03/09/21  8:01 AM  Result Value Ref Range   Glucose-Capillary 91 70 - 99 mg/dL    Comment: Glucose reference range applies only to samples taken after fasting for at least 8 hours.  Glucose, capillary     Status: None   Collection Time: 03/09/21 11:19 AM  Result Value Ref Range   Glucose-Capillary 89 70 - 99 mg/dL    Comment: Glucose reference range applies only to samples taken after fasting for at least 8 hours.  Glucose, capillary     Status: None   Collection Time: 03/09/21  5:12 PM  Result Value Ref Range   Glucose-Capillary 98 70 - 99 mg/dL    Comment: Glucose reference range applies only to samples taken after fasting for at least 8 hours.  Basic metabolic panel     Status: Abnormal   Collection Time: 03/10/21  1:41 AM  Result Value Ref Range   Sodium 138 135 - 145 mmol/L   Potassium 4.7 3.5 - 5.1 mmol/L   Chloride 107 98 - 111 mmol/L   CO2 25 22 - 32 mmol/L   Glucose, Bld 152 (H) 70 - 99 mg/dL    Comment: Glucose reference range applies only to samples taken after fasting for at least 8 hours.   BUN 37 (H) 8 - 23 mg/dL   Creatinine, Ser 1.35 (H) 0.61 - 1.24  mg/dL   Calcium 7.9 (L) 8.9 - 10.3 mg/dL   GFR, Estimated 47 (L) >60 mL/min    Comment: (NOTE) Calculated using the CKD-EPI Creatinine Equation (2021)    Anion gap 6 5 - 15    Comment: Performed at [facility] [address], [place], [place] [address]  Immature Platelet Fraction     Status: None   Collection Time: 03/10/21  1:41 AM  Result Value Ref Range   Immature Platelet Fraction 3.5 1.2 - 8.6 %    Comment: Performed at [facility] [address], [place], [place] [address]  CBC     Status: Abnormal   Collection Time: 03/10/21  1:41 AM  Result Value Ref Range   WBC 8.1 4.0 - 10.5 K/uL   RBC 2.85 (L) 4.22 - 5.81 MIL/uL   Hemoglobin 8.9 (L) 13.0 - 17.0 g/dL   HCT 28.2 (L) 39.0 - 52.0 %   MCV 98.9 80.0 - 100.0 fL   MCH 31.2 26.0 - 34.0 pg   MCHC 31.6 30.0 - 36.0 g/dL   RDW 18.3 (H) 11.5 - 15.5 %   Platelets 93 (L) 150 - 400 K/uL    Comment: Immature Platelet Fraction may be clinically indicated, consider ordering this additional test [id number] CONSISTENT WITH PREVIOUS RESULT REPEATED TO VERIFY    nRBC 0.0 0.0 - 0.2 %    Comment: Performed at [facility], [place] [address] [place], [place] [address]  Phosphorus     Status: Abnormal   Collection Time: 03/10/21  1:41 AM  Result Value Ref Range   Phosphorus 2.4 (L) 2.5 - 4.6 mg/dL    Comment: Performed at [facility] [address], [place], [place] [address]  Glucose, capillary     Status: Abnormal   Collection Time: 03/10/21  5:13 AM  Result Value Ref Range   Glucose-Capillary 104 (H) 70 - 99 mg/dL    Comment: Glucose reference range applies only to samples taken after fasting for at least 8 hours.   Comment 1 Notify RN    Comment 2 Document in Chart   Glucose, capillary     Status: Abnormal   Collection Time: 03/10/21  7:41 AM  Result Value Ref Range   Glucose-Capillary 104 (H) 70 - 99 mg/dL    Comment: Glucose reference range applies only to samples taken after fasting for at least 8  hours.  Glucose, capillary     Status: Abnormal   Collection Time: 03/10/21 11:33 AM  Result Value Ref Range   Glucose-Capillary 104 (H) 70 - 99 mg/dL    Comment: Glucose reference range applies only to samples taken after fasting for at least 8 hours.   No results found.     Medical Problem List and Plan: 1. Functional deficits secondary to debility  -patient may shower but PEG must be covered.   -ELOS/Goals: 2-3 weeks S-MinA  Admit to CIR 2.  Antithrombotics: -DVT/anticoagulation:  Mechanical: Sequential compression devices, below knee Bilateral lower extremities  -antiplatelet therapy: N/A 3. Pain Management: Tylenol 4. Mood:  LCSW to follow   -antipsychotic agents: N/A 5. Neuropsych: This patient is capable of making decisions on his own behalf. 6. Skin/Wound Care: Routine pressure relief measures.  7. Fluids/Electrolytes/Nutrition: Monitor I/O. Continue tube feeds.  8. A flutter s/p TAVR 2020: Eliquis is currently being held pending conversation of risks and benefits today between patient and cardiology. Dr Lilia Pro --Monitor HR TID 9. Severe dysphagia w/aspiration: NPO with tube feeds for nutritonal support.  10. GIB: Monitor for signs of bleeding. Continue Protonix and Carafate. Holding Eliquis.  --check Hgb in am and monitor H/H with serial checks. 11. CKD 3a: BUN/SCr 124/2.07  --improved with hydration and now 37/1.36 12. Constipation: Continue Senna daily.  13. HLD: continue atorvastatin 40mg  daily.  14. Hypothyroidism: continue Synthroid.   I have personally performed a face to face diagnostic evaluation, including, but not limited to relevant history and physical exam findings, of this patient and developed relevant assessment and plan.  Additionally, I have reviewed and concur with the physician assistant's documentation above.  Bary Leriche, PA-C   Izora Ribas, MD 03/10/2021

## 2021-03-10 NOTE — Evaluation (Signed)
Occupational Therapy Assessment and Plan  Patient Details  Name: Gregory [name] MRN: [id number] Date of Birth: 1920/08/02  OT Diagnosis: abnormal posture, cognitive deficits, disturbance of vision, and muscle weakness (generalized) Rehab Potential: Rehab Potential (ACUTE ONLY): Good ELOS: 2 to 2.5 weeks   Today's Date: 03/11/2021 OT Individual Time: 5284-1324 OT Individual Time Calculation (min): 85 min     Hospital Problem: Principal Problem:   Debility   Past Medical History:  Past Medical History:  Diagnosis Date   Atrial flutter ([place])    s/p ablation   Cancer ([place])    skin cancer   Cataracts, bilateral    Hypertension    Neuromuscular disorder ([place])    Pacemaker 2010   Thyroid disease    Past Surgical History:  Past Surgical History:  Procedure Laterality Date   APPENDECTOMY     COLONOSCOPY     EYE SURGERY Bilateral    cataract surgery   HERNIA REPAIR Right    INGUINAL HERNIA REPAIR Left 10/12/2013   Procedure: LEFT  INGUINAL HERNIA REPAIR;  Surgeon: Odis Hollingshead, MD;  Location: [place];  Service: General;  Laterality: Left;   INSERTION OF MESH Left 10/12/2013   Procedure: INSERTION OF MESH;  Surgeon: Odis Hollingshead, MD;  Location: [place];  Service: General;  Laterality: Left;   PEG TUBE PLACEMENT Left     Assessment & Plan Clinical Impression: Gregory [name] is a 88 year old male with history of A Flutter s/p ablation w/PPM, HTN, neuropathy, lung nodule, TVAR, HOH, dysphagia s/p G tube (placed in [place] 02/10/21) who was admitted on 03/02/21 with hypotension, dizziness, near syncope and hgb 5.9.  He was treated with fluid bolus and 2 units PRBC  with improvement of symptoms. FBOT positive with reports of dark stools as well as reports of Hgb of 7.0 and trauma to GI tract due to NGT per family.  Dr. Silverio Decamp recommended conservative management of NPO, IV PPI and transfusion as needed as heme positive stools felt to be related to NGT mucosal trauma and to  hold eliquis. Palliative care consulted to discuss Gregory [name] and patient/family elected on full code/full scope of practice. PEG tube noted to be loose and T-fastener removed  by radiology today. Hgb stabilized and tube feeds resumed. Patient noted to be deconditioned with posterior lean on standing, unsteady gait and difficulty completing ADLs. CIR recommended due to functional decline.   Patient currently requires max with basic self-care skills secondary to muscle weakness, decreased cardiorespiratoy endurance, decreased visual acuity, decreased initiation, decreased problem solving, and decreased safety awareness, and decreased sitting balance, decreased standing balance, decreased postural control, and decreased balance strategies.  Prior to hospitalization, patient could complete BADLs with modified independent .  Patient will benefit from skilled intervention to increase independence with basic self-care skills prior to discharge  home with son .  Anticipate patient will require 24 hour supervision and follow up home health.  OT - End of Session Endurance Deficit: Yes (Simultaneous filing. User may not have seen previous data.) Endurance Deficit Description: Pt needing multiple reclined rest breaks during dynamic tasks (Simultaneous filing. User may not have seen previous data.) OT Assessment Rehab Potential (ACUTE ONLY): Good OT Barriers to Discharge: Other (comments) OT Barriers to Discharge Comments: n/a- pt appears to have 24/7 support at home and can reside on main level if needed OT Patient demonstrates impairments in the following area(s): Balance;Cognition;Vision;Endurance;Motor;Safety OT Basic ADL's Functional Problem(s): Eating;Grooming;Bathing;Dressing;Toileting OT Advanced ADL's Functional Problem(s): Simple Meal Preparation OT  Transfers Functional Problem(s): Toilet;Tub/Shower OT Additional Impairment(s): None OT Plan OT Intensity: Minimum of 1-2 x/day, 45 to 90 minutes OT  Frequency: 5 out of 7 days OT Duration/Estimated Length of Stay: 2 to 2.5 weeks OT Treatment/Interventions: Disease mangement/prevention;Balance/vestibular training;Self Care/advanced ADL retraining;Therapeutic Exercise;UE/LE Strength taining/ROM;Pain management;DME/adaptive equipment instruction;Cognitive remediation/compensation;Patient/family education;UE/LE Coordination activities;Community reintegration;Discharge planning;Functional mobility training;Psychosocial support;Therapeutic Activities;Visual/perceptual remediation/compensation OT Self Feeding Anticipated Outcome(s): No goal- NPO OT Basic Self-Care Anticipated Outcome(s): Supervision OT Toileting Anticipated Outcome(s): Supervision OT Bathroom Transfers Anticipated Outcome(s): Supervision OT Recommendation Recommendations for Other Services: Other (comment) (n/a) Patient destination: Home Follow Up Recommendations: Home health OT Equipment Recommended: To be determined   OT Evaluation Precautions/Restrictions  Precautions Precautions: Fall Precaution Comments: NPO with PEG tube, posterior lean Restrictions Weight Bearing Restrictions: No General   Vital Signs  Pain Pain Assessment Pain Scale: 0-10 Pain Score: 0-No pain Home Living/Prior Functioning Home Living Family/patient expects to be discharged to:: Private residence Living Arrangements: Children Available Help at Discharge: Family, Available 24 hours/day, Personal care attendant Type of Home: House Home Access: Stairs to enter Technical brewer of Steps: 2 Entrance Stairs-Rails: None Home Layout: Two level, Able to live on main level with bedroom/bathroom, 1/2 bath on main level, Bed/bath upstairs Gregory [name] reported that pt uses the acorn chair to transport between levels of home) Bathroom Shower/Tub: Walk-in shower (+seat, +hand held shower hose, +grab bars) Bathroom Toilet: Handicapped height (+toilet bars) Bathroom Accessibility: Yes Additional  Comments: Per pt, son lives at home and does not work, can provide assist as needed  Lives With: Son IADL History Homemaking Responsibilities: Yes (Pt reported doing light cleaning tasks using the quad cane, not eating due to NPO status) Occupation: Retired Type of Occupation: retired Sports administrator Prior Function Level of Independence: Independent with basic ADLs, Independent with homemaking with ambulation, Requires assistive device for independence (per [place], pt used quad cane)  Able to Take Stairs?: Yes Driving: Yes (per pt, he was driving PTA, noted conflicting PLOF information provided from [place] and pt) Vocation: Retired Surveyor, mining Baseline Vision/History: 1 Wears glasses (per daughter Gregory [name] pt wears glasses AAT. Per pt, he does not wear glasses) Ability to See in Adequate Light: 3 Highly impaired Vision Assessment?: Vision impaired- to be further tested in functional context Perception  Perception: Within Functional Limits Praxis Praxis: Impaired Praxis Impairment Details: Initiation (?difficult to assess due to hearing impairments) Cognition Arousal/Alertness: Awake/alert Orientation Level: Person;Place;Situation Person: Oriented Place: Oriented Situation: Oriented Year: 2023 Month: January Day of Week: Incorrect Immediate Memory Recall: Sock;Blue;Bed Memory Recall Sock: With Cue Memory Recall Blue: Without Cue Memory Recall Bed: Not able to recall Problem Solving: Impaired Safety/Judgment: Impaired Comments: Decreased judgement in regards to balance loses during functional activity Sensation Sensation Light Touch: Impaired Detail Peripheral sensation comments: impaired distally in feet bilaterally Light Touch Impaired Details: Impaired RLE;Impaired LLE Hot/Cold: Not tested Proprioception: Impaired by gross assessment Stereognosis: Not tested Coordination Gross Motor Movements are Fluid and Coordinated: No Fine Motor Movements are Fluid and  Coordinated: No Coordination and Movement Description: Affected by balance impairments and baseline tremulous activity in hands bilaterally Finger Nose Finger Test: tremulous bilaterally Motor  Motor Motor: Other (comment);Abnormal postural alignment and control Motor - Skilled Clinical Observations: generalized weakness and deconditioning with impaired balance  Trunk/Postural Assessment  Cervical Assessment Cervical Assessment: Exceptions to [facility] (forward head) Thoracic Assessment Thoracic Assessment: Exceptions to [facility] (kyphotic with rounded shoulders) Lumbar Assessment Lumbar Assessment: Exceptions to [facility] (posterior pelvic tilt) Postural Control Postural Control: Deficits on evaluation (posterior  lean/LOBs during dynamic sitting and standing ADL tasks) Righting Reactions: delayed and insufficient with posterior lean bias Protective Responses: delayed and insufficient with posterior lean bias  Balance Balance Balance Assessed: Yes Static Sitting Balance Static Sitting - Balance Support: Feet supported;Bilateral upper extremity supported Static Sitting - Level of Assistance: Other (comment) (CGA) Dynamic Sitting Balance Dynamic Sitting - Balance Support: During functional activity;No upper extremity supported Dynamic Sitting - Level of Assistance: 2: Max assist (donning overhead shirt) Static Standing Balance Static Standing - Balance Support: During functional activity;Right upper extremity supported Static Standing - Level of Assistance: 3: Mod assist Dynamic Standing Balance Dynamic Standing - Balance Support: During functional activity Dynamic Standing - Level of Assistance: 3: Mod assist Dynamic Standing - Balance Activities: Lateral lean/weight shifting;Forward lean/weight shifting (assisting with clothing mgt during toilet) Extremity/Trunk Assessment RUE Assessment RUE Assessment: Within Functional Limits Active Range of Motion (AROM) Comments: Shoulder mobility ~90  degrees LUE Assessment LUE Assessment: Within Functional Limits Active Range of Motion (AROM) Comments: Shoulder mobility ~90 degrees  Care Tool Care Tool Self Care Eating    N/a- NPO    Oral Care  Oral care, brush teeth, clean dentures activity did not occur:  (n/a due to time constraints)      Bathing   Body parts bathed by patient: Right arm;Left arm;Chest;Abdomen;Front perineal area;Right upper leg;Left upper leg;Face Body parts bathed by helper: Buttocks;Right lower leg;Left lower leg   Assist Level: Maximal Assistance - Patient 24 - 49%    Upper Body Dressing(including orthotics)   What is the patient wearing?: Pull over shirt   Assist Level: Maximal Assistance - Patient 25 - 49%    Lower Body Dressing (excluding footwear)   What is the patient wearing?: Incontinence brief;Pants Assist for lower body dressing: Maximal Assistance - Patient 25 - 49%    Putting on/Taking off footwear   What is the patient wearing?: Non-skid slipper socks Assist for footwear: Total Assistance - Patient < 25%       Care Tool Toileting Toileting activity   Assist for toileting: Maximal Assistance - Patient 25 - 49%      Toilet transfer   Assist Level: Moderate Assistance - Patient 50 - 74%      Refer to Care Plan for Long Term Goals  SHORT TERM GOAL WEEK 1 OT Short Term Goal 1 (Week 1): Pt will complete sit<stand from toilet with no more than Mod A OT Short Term Goal 2 (Week 1): Pt will complete UB dressing with Mod A OT Short Term Goal 3 (Week 1): Pt will complete 1/3 components of donning pants using AE or adaptive techniques as needed  Recommendations for other services: None    Skilled Therapeutic Intervention Skilled OT session completed with focus on initial evaluation, education on OT role/POC, and establishment of patient-centered goals.   Pt greeted in bed with no c/o pain but extremely hard of hearing. Used mostly gestures/visual cues at start of session to convey  instruction. Daughter Gregory [name] arrived when pt was completing UB self care EOB and then his hearing aides were brought in/placed in ears. Pt able to hear much better afterwards. Per daughter Gregory [name], pt at baseline cognitively. Note that EOB pt with posterior lean/LOBs with pt needing cues to recognize and assist to correct. He needed reclined rest breaks. Max A for LB self care due to activity tolerance and balance deficits. Min A for ambulatory transfer to the toilet using RW, pt with +bladder void. Max A for toileting tasks and  also for sit<stand from standard toilet. Mod A for hand washing using sanitizer while sitting in the recliner afterwards. Pts daughter very helpful in providing pts PLOF and medical journey. Pt remained sitting in the recliner with all needs within reach and chair alarm set.   ADL ADL Eating: NPO Grooming: Moderate assistance (due to hand tremors and strength deficits) Where Assessed-Grooming: Chair Upper Body Bathing: Moderate assistance (for sitting balance) Where Assessed-Upper Body Bathing: Edge of bed Lower Body Bathing: Maximal assistance Where Assessed-Lower Body Bathing: Edge of bed Upper Body Dressing: Maximal assistance Where Assessed-Upper Body Dressing: Edge of bed Lower Body Dressing: Dependent Where Assessed-Lower Body Dressing: Edge of bed Toileting: Maximal assistance Where Assessed-Toileting: Glass blower/designer: Moderate assistance (Min A ambulatory transfer to the toilet using RW, Max A for sit<stand from standard toilet. Per daughter, pt has elevated toilet seat at home with toilet bars) Tub/Shower Transfer: Not assessed Mobility  Bed Mobility Bed Mobility: Supine to Sit;Sit to Supine Supine to Sit: Moderate Assistance - Patient 50-74% Sit to Supine: Moderate Assistance - Patient 50-74% Transfers Sit to Stand: Moderate Assistance - Patient 50-74% Stand to Sit: Moderate Assistance - Patient 50-74%   Discharge Criteria: Patient will be  discharged from OT if patient refuses treatment 3 consecutive times without medical reason, if treatment goals not met, if there is a change in medical status, if patient makes no progress towards goals or if patient is discharged from hospital.  The above assessment, treatment plan, treatment alternatives and goals were discussed and mutually agreed upon: by patient and by family  Skeet Simmer 03/11/2021, 12:45 PM

## 2021-03-10 NOTE — H&P (Shared)
Physical Medicine and Rehabilitation Admission H&P    Chief Complaint  Patient presents with   Debility    HPI: Gregory [name] is a 88 year old male with history of A Flutter s/p ablation w/PPM, HTN, neuropathy, lung nodule, TVAR, HOH, dysphagia s/p G tube (placed in [place] 02/10/21) who was admitted on 03/02/21 with hypotension, dizziness, near syncope and hgb 5.9.  He was treated with fluid bolus and 2 units PRBC  with improvement of symptoms. FBOT positive with reports of dark stools as well as reports of Hgb of 7.0 and trauma to GI tract due to NGT per family.  Dr. Silverio Decamp recommended conservative management of NPO, IV PPI and transfusion as needed as heme positive stools felt to be related to NGT mucosal trauma and to hold eliquis. Palliative care consulted to discuss [place] and patient/family elected on full code/full scope of practice. PEG tube noted to be loose and T-fastener removed  by radiology today. Hgb stabilized and tube feeds resumed. Patient noted to be deconditioned with posterior lean on standing, unsteady gait and difficulty completing ADLs. CIR recommended due to functional decline.    Review of Systems  Unable to perform ROS: Other  Constitutional:  Negative for chills and fever.  HENT:  Positive for hearing loss.   Eyes:  Negative for blurred vision and double vision.  Respiratory:  Positive for cough.        Had nebulizer Rx in the past--but did not use it.   Cardiovascular:  Negative for chest pain and palpitations.  Gastrointestinal:  Positive for heartburn.  Genitourinary:  Negative for dysuria.  Musculoskeletal:  Negative for back pain and neck pain.  Skin:  Negative for rash.  Neurological:  Positive for weakness.    Past Medical History:  Diagnosis Date   Atrial flutter [facility])    s/p ablation   Cancer [facility])    skin cancer   Cataracts, bilateral    Hypertension    Neuromuscular disorder ([place])    Pacemaker 2010   Thyroid disease     Past  Surgical History:  Procedure Laterality Date   APPENDECTOMY     COLONOSCOPY     EYE SURGERY Bilateral    cataract surgery   HERNIA REPAIR Right    INGUINAL HERNIA REPAIR Left 10/12/2013   Procedure: LEFT  INGUINAL HERNIA REPAIR;  Surgeon: Odis Hollingshead, MD;  Location: [place];  Service: General;  Laterality: Left;   INSERTION OF MESH Left 10/12/2013   Procedure: INSERTION OF MESH;  Surgeon: Odis Hollingshead, MD;  Location: [facility] OR;  Service: General;  Laterality: Left;   PEG TUBE PLACEMENT Left     Family History  Problem Relation Age of Onset   Heart disease Mother    Cancer Father        leukemia    Social History: Son lives with patient. He was independent with cane prior to Dec 2022. He reports that he has never smoked. He has never used smokeless tobacco. He reports that he does not drink alcohol and does not use drugs.   Allergies  Allergen Reactions   Amlodipine Besylate     Other reaction(s): edema   Penicillins Hives and Rash    Medications Prior to Admission  Medication Sig Dispense Refill   atorvastatin (LIPITOR) 40 MG tablet Take 40 mg by mouth at bedtime.     CALCIUM PO Take 1 tablet by mouth daily.     Cholecalciferol (VITAMIN D3 PO) Take 1  tablet by mouth daily.     [EXPIRED] clotrimazole (MYCELEX) 10 MG troche Take 10 mg by mouth 5 (five) times daily. For 7 days     ELIQUIS 2.5 MG TABS tablet TAKE 1 TABLET BY MOUTH TWICE A DAY (Patient taking differently: Place 2.5 mg into feeding tube 2 (two) times daily.) 825 tablet 1   folic acid (FOLVITE) 1 MG tablet Take 1 mg by mouth daily.     levothyroxine (SYNTHROID) 75 MCG tablet Take 75 mcg by mouth daily.     Multiple Vitamin (MULTIVITAMIN) tablet Take 1 tablet by mouth daily.     NIACIN PO Take 1 tablet by mouth daily.     Nutritional Supplements (NUTREN 1.5 EN) 250 mLs by Enteral route in the morning, at noon, in the evening, and at bedtime.     sucralfate (CARAFATE) 1 GM/10ML suspension Place 1 g into feeding  tube every 6 (six) hours.     vitamin B-12 (CYANOCOBALAMIN) 1000 MCG tablet Take 1,000 mcg by mouth daily.      vitamin C (ASCORBIC ACID) 500 MG tablet Take 500 mg by mouth daily.      Drug Regimen Review { DRUG REGIMEN [id number]}  Home: Home Living Family/patient expects to be discharged to:: Private residence Living Arrangements: Children (son) Available Help at Discharge: Family Type of Home: House Home Access: Stairs to enter Technical brewer of Steps: 2 Entrance Stairs-Rails: Right Home Layout: Two level, Able to live on main level with bedroom/bathroom, 1/2 bath on main level, Bed/bath upstairs Alternate Level Stairs-Rails: Right Bathroom Shower/Tub: Multimedia programmer: Handicapped height Bathroom Accessibility: Yes Home Equipment: [place] - single point, Grab bars - tub/shower Additional Comments: Son lives with pt; daughter nearby and does majority of household tasks   Functional History: Prior Function ADLs Comments: Does bird bathing on first floor; but handicapped accessible bathroom is upstairs  Functional Status:  Mobility: Bed Mobility Overal bed mobility: Needs Assistance Bed Mobility: Supine to Sit Supine to sit: Min assist, HOB elevated General bed mobility comments: verbal cues with instructions for technique and rail use Transfers Overall transfer level: Needs assistance Equipment used: Rolling walker (2 wheels) Transfers: Sit to/from Stand, Bed to chair/wheelchair/BSC Sit to Stand: Mod assist Bed to/from chair/wheelchair/BSC transfer type:: Step pivot Stand pivot transfers: +2 safety/equipment, +2 physical assistance, Mod assist Step pivot transfers: Mod assist General transfer comment: RW use with mod assist for balance Ambulation/Gait Ambulation/Gait assistance: Mod assist Gait Distance (Feet): 8 Feet (+ 36') Assistive device: Rolling walker (2 wheels) Gait Pattern/deviations: Step-to pattern, Step-through pattern, Decreased  stride length, Trunk flexed, Leaning posteriorly General Gait Details: Very slow, guarded, unsteady gait with RW and modA for stability, noted knee instability requiring intermittent assist to prevent LOB and manage RW; 1x seated rest at sink for ADL tasks; close chair follow for safety to allow additional seated rest Gait velocity: Decreased    ADL: ADL Overall ADL's : Needs assistance/impaired Eating/Feeding: NPO Grooming: Wash/dry hands, Wash/dry face, Oral care, Brushing hair, Minimal assistance Grooming Details (indicate cue type and reason): min assist to complete combing hair seated in recliner Upper Body Bathing: Minimal assistance, Sitting Lower Body Bathing: Maximal assistance, Sit to/from stand Upper Body Dressing : Minimal assistance, Sitting Lower Body Dressing: Maximal assistance, Sit to/from stand Lower Body Dressing Details (indicate cue type and reason): able to doff left sock and partically for right.  Max assist to [employer]: Moderate assistance, Maximal assistance, +2 for physical assistance, +2 for safety/equipment, Stand-pivot, Rolling walker (  2 wheels) (simulated to recliner) Toilet Transfer Details (indicate cue type and reason): Mod-Max A for power up from EOB. Assistance to manage RW and pivot to recliner. Performing second sit<>stand with Mod A +2 and stabilizing RW. posterior lean Functional mobility during ADLs: Minimal assistance, Moderate assistance, Rolling walker (2 wheels), +2 for physical assistance, +2 for safety/equipment General ADL Comments: grooming and LB dressing addressed seated in recliner  Cognition: Cognition Overall Cognitive Status: Within Functional Limits for tasks assessed Orientation Level: Oriented X4 Cognition Arousal/Alertness: Awake/alert Behavior During Therapy: WFL for tasks assessed/performed Overall Cognitive Status: Within Functional Limits for tasks assessed General Comments: HOH, required step by step instructions  for bed mobiltiy and tranfers.   Blood pressure (!) 123/54, pulse 73, temperature 97.8 F (36.6 C), temperature source Oral, resp. rate 20, height 5\' 8"  (1.727 m), weight 73 kg, SpO2 94 %. Physical Exam Vitals and nursing note reviewed.  Constitutional:      Appearance: Normal appearance.     Comments: Weak and mildly dysarthric speech.   Cardiovascular:     Rate and Rhythm: Rhythm irregular.  Pulmonary:     Breath sounds: Examination of the right-upper field reveals rales. Examination of the right-middle field reveals rales. Examination of the right-lower field reveals rales. Examination of the left-lower field reveals rales. Rales present.  Neurological:     Mental Status: He is alert.    Results for orders placed or performed during the hospital encounter of 03/02/21 (from the past 48 hour(s))  Glucose, capillary     Status: Abnormal   Collection Time: 03/08/21 12:38 PM  Result Value Ref Range   Glucose-Capillary 138 (H) 70 - 99 mg/dL    Comment: Glucose reference range applies only to samples taken after fasting for at least 8 hours.  Glucose, capillary     Status: Abnormal   Collection Time: 03/08/21  5:28 PM  Result Value Ref Range   Glucose-Capillary 118 (H) 70 - 99 mg/dL    Comment: Glucose reference range applies only to samples taken after fasting for at least 8 hours.  Glucose, capillary     Status: Abnormal   Collection Time: 03/08/21  8:12 PM  Result Value Ref Range   Glucose-Capillary 121 (H) 70 - 99 mg/dL    Comment: Glucose reference range applies only to samples taken after fasting for at least 8 hours.  Glucose, capillary     Status: Abnormal   Collection Time: 03/09/21 12:06 AM  Result Value Ref Range   Glucose-Capillary 132 (H) 70 - 99 mg/dL    Comment: Glucose reference range applies only to samples taken after fasting for at least 8 hours.  CBC     Status: Abnormal   Collection Time: 03/09/21  2:15 AM  Result Value Ref Range   WBC 9.7 4.0 - 10.5 K/uL    RBC 3.12 (L) 4.22 - 5.81 MIL/uL   Hemoglobin 9.6 (L) 13.0 - 17.0 g/dL   HCT 30.2 (L) 39.0 - 52.0 %   MCV 96.8 80.0 - 100.0 fL   MCH 30.8 26.0 - 34.0 pg   MCHC 31.8 30.0 - 36.0 g/dL   RDW 18.9 (H) 11.5 - 15.5 %   Platelets 87 (L) 150 - 400 K/uL    Comment: Immature Platelet Fraction may be clinically indicated, consider ordering this additional test [id number] CONSISTENT WITH PREVIOUS RESULT REPEATED TO VERIFY    nRBC 0.0 0.0 - 0.2 %    Comment: Performed at [facility], [place]  [address] [place], [place] [address]  Glucose, capillary     Status: Abnormal   Collection Time: 03/09/21  4:18 AM  Result Value Ref Range   Glucose-Capillary 101 (H) 70 - 99 mg/dL    Comment: Glucose reference range applies only to samples taken after fasting for at least 8 hours.  Glucose, capillary     Status: None   Collection Time: 03/09/21  8:01 AM  Result Value Ref Range   Glucose-Capillary 91 70 - 99 mg/dL    Comment: Glucose reference range applies only to samples taken after fasting for at least 8 hours.  Glucose, capillary     Status: None   Collection Time: 03/09/21 11:19 AM  Result Value Ref Range   Glucose-Capillary 89 70 - 99 mg/dL    Comment: Glucose reference range applies only to samples taken after fasting for at least 8 hours.  Glucose, capillary     Status: None   Collection Time: 03/09/21  5:12 PM  Result Value Ref Range   Glucose-Capillary 98 70 - 99 mg/dL    Comment: Glucose reference range applies only to samples taken after fasting for at least 8 hours.  Basic metabolic panel     Status: Abnormal   Collection Time: 03/10/21  1:41 AM  Result Value Ref Range   Sodium 138 135 - 145 mmol/L   Potassium 4.7 3.5 - 5.1 mmol/L   Chloride 107 98 - 111 mmol/L   CO2 25 22 - 32 mmol/L   Glucose, Bld 152 (H) 70 - 99 mg/dL    Comment: Glucose reference range applies only to samples taken after fasting for at least 8 hours.   BUN 37 (H) 8 - 23 mg/dL   Creatinine, Ser 1.35 (H) 0.61 -  1.24 mg/dL   Calcium 7.9 (L) 8.9 - 10.3 mg/dL   GFR, Estimated 47 (L) >60 mL/min    Comment: (NOTE) Calculated using the CKD-EPI Creatinine Equation (2021)    Anion gap 6 5 - 15    Comment: Performed at [facility] [address], [place], [place] [address]  Immature Platelet Fraction     Status: None   Collection Time: 03/10/21  1:41 AM  Result Value Ref Range   Immature Platelet Fraction 3.5 1.2 - 8.6 %    Comment: Performed at [facility] [address], [place], [place] [address]  CBC     Status: Abnormal   Collection Time: 03/10/21  1:41 AM  Result Value Ref Range   WBC 8.1 4.0 - 10.5 K/uL   RBC 2.85 (L) 4.22 - 5.81 MIL/uL   Hemoglobin 8.9 (L) 13.0 - 17.0 g/dL   HCT 28.2 (L) 39.0 - 52.0 %   MCV 98.9 80.0 - 100.0 fL   MCH 31.2 26.0 - 34.0 pg   MCHC 31.6 30.0 - 36.0 g/dL   RDW 18.3 (H) 11.5 - 15.5 %   Platelets 93 (L) 150 - 400 K/uL    Comment: Immature Platelet Fraction may be clinically indicated, consider ordering this additional test [id number] CONSISTENT WITH PREVIOUS RESULT REPEATED TO VERIFY    nRBC 0.0 0.0 - 0.2 %    Comment: Performed at [facility], [place] [address], [place], [place] [address]  Phosphorus     Status: Abnormal   Collection Time: 03/10/21  1:41 AM  Result Value Ref Range   Phosphorus 2.4 (L) 2.5 - 4.6 mg/dL    Comment: Performed at [facility]  [address] [place], [place] [address]  Glucose, capillary     Status: Abnormal   Collection Time: 03/10/21  5:13 AM  Result Value Ref Range   Glucose-Capillary 104 (H) 70 - 99 mg/dL    Comment: Glucose reference range applies only to samples taken after fasting for at least 8 hours.   Comment 1 Notify RN    Comment 2 Document in Chart   Glucose, capillary     Status: Abnormal   Collection Time: 03/10/21  7:41 AM  Result Value Ref Range   Glucose-Capillary 104 (H) 70 - 99 mg/dL    Comment: Glucose reference range applies only to samples taken after fasting for at least  8 hours.   No results found.     Medical Problem List and Plan: 1. Functional deficits secondary to ***  -patient may *** shower  -ELOS/Goals: *** 2.  Antithrombotics: -DVT/anticoagulation:  Mechanical: Sequential compression devices, below knee Bilateral lower extremities  -antiplatelet therapy: N/A 3. Pain Management: Tylenol 4. Mood:  LCSW to follow   -antipsychotic agents: N/A 5. Neuropsych: This patient *** capable of making decisions on *** own behalf. 6. Skin/Wound Care: Routine pressure relief measures.  7. Fluids/Electrolytes/Nutrition: Monitor I/O. Continue tube feeds.  8. CHB/ A flutter: On Eliquis--Dr Jordan/[place] --Monitor HR TID 9. Severe dysphagia w/aspiration: NPO with tube feeds for nutritonal support.  10. GIB: Monitor for signs of bleeding. Continue Protonix and Carafate.  --check Hgb in am and monitor H/H with serial checks. 11. CKD 3a: BUN/SCr 124/2.07  --improved with hydration and now 37/1.36 12. Constipation: Continue Senna daily.    ***  Bary Leriche, PA-C 03/10/2021

## 2021-03-10 NOTE — Progress Notes (Signed)
Inpatient Rehabilitation Medication Review by a Pharmacist  A complete drug regimen review was completed for this patient to identify any potential clinically significant medication issues.  Clinically significant medication issues were identified:  Yes   Type of Medication Issue Identified Description of Issue Plan Plan Accepted by Provider? (Yes / No)  Drug Interaction(s) (clinically significant)      Duplicate Therapy      Allergy      No Medication Administration End Date      Incorrect Dose      Additional Drug Therapy Needed  Patient is on apixaban prior to admission for aflutter which has been held for GIB. Per discharge summary on 03/10/21 family has decided to hold apixaban with plan to resume in coming weeks.  Will continue to monitor plans to resume apixaban   Other        Name of provider notified for issues identified:   Provider Method of Notification: no provider needed notifying currently, pharmacy will continue to follow plans.   Pharmacist comments: n/a  Time spent performing this drug regimen review (minutes):  15  Cristela Felt, PharmD, BCPS Clinical Pharmacist 03/10/2021 8:25 PM

## 2021-03-10 NOTE — Progress Notes (Signed)
Physical Therapy Treatment Patient Details Name: Gregory [name] MRN: [id number] DOB: [birth year] Today's Date: 03/10/2021   History of Present Illness Pt is a 88 y.o. male admitted 03/02/21 with dizziness and near-sycnope; workup for anemia secondary to GIB. Of note, pt with h/o oropharyngeal dysphagia s/p PEG placement 6/72/09 (??-conflicting dates in chart) at [facility] in [facility]. PMH includes aflutter, HTN, pacemaker, CA, neuromuscular disorder, cataracts, COVID (07/2020).    PT Comments    Pt is making great, steady progress with mobility, ambulating an increased distance of up to ~110 ft with a RW and min guard-minA today. However, pt still with deficits in balance and lower extremity strength, resulting in him requiring up to [facility] for transfers. As pt performed subsequent reps with max cues for set-up and anterior weight shift facilitation he was able to progress to performing transfers with minA by end of session. Will continue to follow acutely. Current recommendations remain appropriate.    Recommendations for follow up therapy are one component of a multi-disciplinary discharge planning process, led by the attending physician.  Recommendations may be updated based on patient status, additional functional criteria and insurance authorization.  Follow Up Recommendations  Acute inpatient rehab (3hours/day)     Assistance Recommended at Discharge Frequent or constant Supervision/Assistance  Patient can return home with the following A lot of help with walking and/or transfers;A lot of help with bathing/dressing/bathroom;Assistance with cooking/housework;Assist for transportation;Help with stairs or ramp for entrance   Equipment Recommendations  Rolling walker (2 wheels);BSC/3in1;Wheelchair (measurements PT) (TBD)    Recommendations for Other Services       Precautions / Restrictions Precautions Precautions: Fall;Other (comment) Precaution Comments: peg tube Restrictions Weight  Bearing Restrictions: No     Mobility  Bed Mobility Overal bed mobility: Needs Assistance Bed Mobility: Supine to Sit     Supine to sit: HOB elevated, Min guard     General bed mobility comments: increased time and effort, cues to scoot hips to EOB    Transfers Overall transfer level: Needs assistance Equipment used: Rolling walker (2 wheels) Transfers: Sit to/from Stand Sit to Stand: Mod assist, Min assist           General transfer comment: Sit to stand from EOB 1x with modA and 3x from recliner, progressing from [place] first rep to minA subsequent 2 reps. Cues provided to bring nose over toes and push more through forefeet than back in his heels to facilitate anterior weight shift. Cues provided for hand placement.    Ambulation/Gait Ambulation/Gait assistance: Min assist, Min guard Gait Distance (Feet): 110 Feet Assistive device: Rolling walker (2 wheels) Gait Pattern/deviations: Step-to pattern, Step-through pattern, Decreased stride length, Trunk flexed, Decreased dorsiflexion - left, Decreased dorsiflexion - right Gait velocity: Decreased Gait velocity interpretation: <1.31 ft/sec, indicative of household ambulator   General Gait Details: Very slow gait, needing repeated cues to look superiorly and improve posture and improve feet clearance. Min guard-minA for stability, knee instability noted as pt fatigued but no appreciative knee buckling.   Stairs             Wheelchair Mobility    Modified Rankin (Stroke Patients Only)       Balance Overall balance assessment: Needs assistance Sitting-balance support: No upper extremity supported, Feet supported Sitting balance-Leahy Scale: Fair Sitting balance - Comments: Static sitting EOB with supervision.   Standing balance support: Bilateral upper extremity supported, During functional activity Standing balance-Leahy Scale: Poor Standing balance comment: Reliant on UE support  Cognition Arousal/Alertness: Awake/alert Behavior During Therapy: WFL for tasks assessed/performed Overall Cognitive Status: Within Functional Limits for tasks assessed                                 General Comments: WFL for simple tasks, not formally assessed; pt HOH        Exercises Other Exercises Other Exercises: sit <> stand 4x    General Comments        Pertinent Vitals/Pain Pain Assessment Pain Assessment: Faces Faces Pain Scale: Hurts a little bit Pain Location: bil distal great toes at wounds Pain Descriptors / Indicators: Discomfort Pain Intervention(s): Limited activity within patient's tolerance, Monitored during session    Home Living                          Prior Function            PT Goals (current goals can now be found in the care plan section) Acute Rehab PT Goals Patient Stated Goal: Regain independence, return home PT Goal Formulation: With patient/family Time For Goal Achievement: 03/21/21 Potential to Achieve Goals: Good Progress towards PT goals: Progressing toward goals    Frequency    Min 3X/week      PT Plan Current plan remains appropriate    Co-evaluation              AM-PAC PT "6 Clicks" Mobility   Outcome Measure  Help needed turning from your back to your side while in a flat bed without using bedrails?: A Little Help needed moving from lying on your back to sitting on the side of a flat bed without using bedrails?: A Little Help needed moving to and from a bed to a chair (including a wheelchair)?: A Little Help needed standing up from a chair using your arms (e.g., wheelchair or bedside chair)?: A Lot Help needed to walk in hospital room?: A Little Help needed climbing 3-5 steps with a railing? : A Lot 6 Click Score: 16    End of Session Equipment Utilized During Treatment: Gait belt Activity Tolerance: Patient tolerated treatment well Patient left: in chair;with call  bell/phone within reach;with chair alarm set;with family/visitor present Nurse Communication: Mobility status;Other (comment) (couple drops of blood from R nose that quickly stopped) PT Visit Diagnosis: Other abnormalities of gait and mobility (R26.89);Muscle weakness (generalized) (M62.81);Unsteadiness on feet (R26.81);Difficulty in walking, not elsewhere classified (R26.2)     Time: 0932-6712 PT Time Calculation (min) (ACUTE ONLY): 37 min  Charges:  $Gait Training: 8-22 mins $Therapeutic Activity: 8-22 mins                     Moishe Spice, PT, DPT Acute Rehabilitation Services  Pager: [phone] Office: [place] 03/10/2021, 2:48 PM

## 2021-03-10 NOTE — Care Management Important Message (Signed)
Important Message  Patient Details  Name: Gregory [name] MRN: [id number] Date of Birth: August 13, 1920   Medicare Important Message Given:  Yes     Shelda Altes 03/10/2021, 10:26 AM

## 2021-03-10 NOTE — Progress Notes (Signed)
Patient transferred to 4MW #12. All personal belongings packed and taken with patient. Son present.  Daymon Larsen, RN

## 2021-03-10 NOTE — Progress Notes (Signed)
Signed                                                                                                                                                                                                                                                                                                                                                                                                                                                                 PMR Admission Coordinator Pre-Admission Assessment   Patient: Gregory [name] is an 88 y.o., male MRN: [id number] DOB: July 19, 1920 Height: '5\' 8"'  (172.7 cm) Weight: 66.7 kg   Insurance Information HMO:     PPO:      PCP:      IPA:      80/20:      OTHER:  PRIMARY: Medicare       Policy#:   [id number]    Subscriber: Pt. Phone#: Verified online    Fax#:  Pre-Cert#:       Employer:  Benefits:  Phone #:      Name:  Eff. Date: Parts A ad B effective  09/13/87 Deduct: $1600      Out of Pocket Max:  None      Life Max: N/A  CIR: 100%      SNF: 100 days Outpatient: 80%  Co-Pay: 20% Home Health: 100%      Co-Pay: none DME: 80%     Co-Pay: 20% Providers: patient's choice Providers: in network SECONDARY: [place]      Policy#: [id number]     Phone#:                Financial Counselor:       Phone#:    The Actuary for patients in Inpatient Rehabilitation Facilities with attached Privacy Act [place] Records was provided and verbally reviewed with: Patient and Family   Emergency Contact Information Contact Information       Name Relation Home Work Mobile    [place] Daughter     [place] Son     [phone]           Current Medical History  Patient Admitting Diagnosis: GI Bleed History of Present Illness: [name]  is a 88 year old person with history of aortic stenosis status post prosthetic valve replacement in 2020, paroxysmal A. fib on Eliquis, complete heart block status post permanent pacemaker implantation, oropharyngeal dysphagia status post PEG tube placement in December 2022, hyperlipidemia, hypothyroidism, and prior COVID infection in June 2022 who presented  to the ED 03/02/20 for complaints of dizziness and near syncope.   Patient recently hospitalized at the [facility] in [place] for PEG placement secondary to dysphagia (pt. Had a choking episode at GI office, swallow study performed at [facility] revealed no aspiration) in December.  No p.o. intake since that time.  During hospitalization for PEG tube placement appears that hemoglobin dropped below 7 several times requiring blood transfusions.  Patient denies dark, black, or bloody stools although notes that he has not had a bowel movement in about 2 weeks since discharge from previous hospitalization until yesterday when he had 2 movements.  On initial evaluation in the emergency department patient was hypotensive with a MAP as low as 53.  Heart rate was in the 70s.  Tachypneic although on room air.  Patient received 2 L IV fluids with improvement in blood pressure.  Labs notable for hemoglobin of 5.9, mild leukocytosis to 12.3, and normal platelets.  Patient received 2 units of blood.  GI was consulted and recommended IV PPI, n.p.o. and transfusions for hemoglobin goal greater than 7.  GI team concerned about patient's high risk for undergoing anesthesia or endoscopic procedures at this time. Overnight patient continued to be intermittently hypotensive.  Received an additional 1 L of IV fluids and 1 unit of blood for hemoglobin of 6.9.  Ferritin elevated, B12 folate normal.  Hypoproliferative response on reticulocyte index. GI consulted and felt that had an UGI bleed, but recommended against anaesthesia and endoscopic procedures due to advanced age and low Hgb. Pt.  Received multiple units of PRBCs and CIR was consulted to assist return to PLOF.   Patient's medical record from Gregory [name]  has been reviewed by the rehabilitation admission coordinator and physician.   Past Medical History      Past Medical History:  Diagnosis Date   Atrial flutter [facility])      s/p ablation   Cancer [facility])      skin cancer   Cataracts, bilateral     Hypertension     Neuromuscular disorder [facility])     Pacemaker 2010   Thyroid disease        Has the patient had major surgery during 100 days prior to admission? No   Family History  family history includes Cancer in his father; Heart disease in his mother.   Current Medications   Current Facility-Administered Medications:    0.9 %  sodium chloride infusion (Manually program via Guardrails IV Fluids), , Intravenous, Once, Orvis Brill, MD, Stopped at 03/06/21 2233   atorvastatin (LIPITOR) tablet 40 mg, 40 mg, Per Tube, QHS, Gaylan Gerold, DO, 40 mg at 03/07/21 2251   feeding supplement (OSMOLITE 1.5 CAL) liquid 355 mL, 355 mL, Per Tube, BID, Charise Killian, MD, 355 mL at 03/07/21 2251   feeding supplement (OSMOLITE 1.5 CAL) liquid 474 mL, 474 mL, Per Tube, Q2000, Charise Killian, MD, 474 mL at 03/08/21 1145   free water 200 mL, 200 mL, Per Tube, Q6H, Hoffman, Erik C, DO, 200 mL at 03/08/21 1300   levothyroxine (SYNTHROID) tablet 75 mcg, 75 mcg, Per Tube, Q0600, Gaylan Gerold, DO, 75 mcg at 03/08/21 0510   pantoprazole sodium (PROTONIX) 40 mg/20 mL oral suspension 40 mg, 40 mg, Per Tube, BID, Orvis Brill, MD, 40 mg at 03/08/21 1145   sucralfate (CARAFATE) 1 GM/10ML suspension 1 g, 1 g, Per Tube, Q8H, Gaylan Gerold, DO, 1 g at 03/08/21 1408   Patients Current Diet:  Diet Order                  Diet NPO time specified  Diet effective now                         Precautions / Restrictions Precautions Precautions: Fall, Other (comment) Precaution Comments: peg tube Restrictions Weight Bearing  Restrictions: No    Has the patient had 2 or more falls or a fall with injury in the past year? Yes   Prior Activity Level Community (5-7x/wk): Pt. went out regularly PTA   Prior Functional Level Self Care: Did the patient need help bathing, dressing, using the toilet or eating? Independent   Indoor Mobility: Did the patient need assistance with walking from room to room (with or without device)? Independent   Stairs: Did the patient need assistance with internal or external stairs (with or without device)? Independent   Functional Cognition: Did the patient need help planning regular tasks such as shopping or remembering to take medications? Independent   Patient Information Are you of Hispanic, Latino/a,or Spanish origin?: A. No, not of Hispanic, Latino/a, or Spanish origin What is your race?: A. White Do you need or want an interpreter to communicate with a doctor or health care staff?: 0. No   Patient's Response To:  Health Literacy and Transportation Is the patient able to respond to health literacy and transportation needs?: Yes Health Literacy - How often do you need to have someone help you when you read instructions, pamphlets, or other written material from your doctor or pharmacy?: Never In the past 12 months, has lack of transportation kept you from medical appointments or from getting medications?: No In the past 12 months, has lack of transportation kept you from meetings, work, or from getting things needed for daily living?: No   Home Assistive Devices / Section: Cane - single point, Grab bars - tub/shower   Prior Device Use: Indicate devices/aids used by the patient prior to current illness, exacerbation or injury? None of the above   Current Functional Level Cognition   Overall Cognitive Status: Within Functional Limits for tasks assessed Orientation Level: Oriented X4 General Comments: WFL for simple tasks, not formally assessed; pt [facility]  Extremity Assessment (includes Sensation/Coordination)   Upper Extremity Assessment: Generalized weakness  Lower Extremity Assessment: Generalized weakness     ADLs   Overall ADL's : Needs assistance/impaired Eating/Feeding: NPO Grooming: Supervision/safety, Set up, Sitting Upper Body Bathing: Minimal assistance, Sitting Lower Body Bathing: Maximal assistance, Sit to/from stand Upper Body Dressing : Minimal assistance, Sitting Lower Body Dressing: Maximal assistance, Sit to/from stand Toilet Transfer: Moderate assistance, Maximal assistance, +2 for physical assistance, +2 for safety/equipment, Stand-pivot, Rolling walker (2 wheels) (simulated to recliner) Toilet Transfer Details (indicate cue type and reason): Mod-Max A for power up from EOB. Assistance to manage RW and pivot to recliner. Performing second sit<>stand with Mod A +2 and stabilizing RW. posterior lean Functional mobility during ADLs: Minimal assistance, Moderate assistance, Rolling walker (2 wheels), +2 for physical assistance, +2 for safety/equipment General ADL Comments: Presenting with decreased balance and strength. Very motivated     Mobility   Overal bed mobility: Needs Assistance Bed Mobility: Supine to Sit Supine to sit: Min assist, HOB elevated General bed mobility comments: increased time and effort, minA for trunk elevation     Transfers   Overall transfer level: Needs assistance Equipment used: Rolling walker (2 wheels) Transfers: Sit to/from Stand Sit to Stand: Mod assist Bed to/from chair/wheelchair/BSC transfer type:: Step pivot Stand pivot transfers: +2 safety/equipment, +2 physical assistance, Mod assist Step pivot transfers: Mod assist, +2 physical assistance, +2 safety/equipment General transfer comment: Multiple sit<>stand from EOB and recliner, repeated verbal cues for hand placement and sequencing, heavy modA for trunk elevation; pt bracing BLEs against bed/chair with posterior lean to stand,  difficulty correct despite cues     Ambulation / Gait / Stairs / Wheelchair Mobility   Ambulation/Gait Ambulation/Gait assistance: Mod assist Gait Distance (Feet): 8 Feet (+ 36') Assistive device: Rolling walker (2 wheels) Gait Pattern/deviations: Step-to pattern, Step-through pattern, Decreased stride length, Trunk flexed, Leaning posteriorly General Gait Details: Very slow, guarded, unsteady gait with RW and modA for stability, noted knee instability requiring intermittent assist to prevent LOB and manage RW; 1x seated rest at sink for ADL tasks; close chair follow for safety to allow additional seated rest Gait velocity: Decreased     Posture / Balance Balance Overall balance assessment: Needs assistance Sitting-balance support: No upper extremity supported, Feet supported Sitting balance-Leahy Scale: Fair Standing balance support: Bilateral upper extremity supported, During functional activity Standing balance-Leahy Scale: Poor Standing balance comment: heavy reliance on BUE support and external assist, posterior lean improving this session     Special needs/care consideration Skin Abrasion: sacrum/mid; Blister; heel/left; Ecchymosis: arm/bilateral; Pressure injury: sacrum/mid; heel/right; heel/left; foot/right, lateral; foot/left, lateral; Wound: toe tips of great toes/right,left,bilateral External urinary catheter  and Special service needs PEG, bolus feeds    Previous Home Environment (from acute therapy documentation) Living Arrangements: Children (son) Available Help at Discharge: Family Type of Home: House Home Layout: Two level, Able to live on main level with bedroom/bathroom, 1/2 bath on main level, Bed/bath upstairs Alternate Level Stairs-Rails: Right Home Access: Stairs to enter Entrance Stairs-Rails: Right Entrance Stairs-Number of Steps: 2 Bathroom Shower/Tub: Multimedia programmer: Handicapped height Bathroom Accessibility: Yes How Accessible: Accessible  via wheelchair, Accessible via walker Additional Comments: Son lives with pt; daughter nearby and does majority of household tasks   Discharge Living Setting Plans for Discharge Living Setting: Patient's home Type of Home at Discharge: House Discharge Home Layout: Two level, Able to live on main level with bedroom/bathroom Alternate Level Stairs-Rails: Right Alternate Level Stairs-Number of Steps: full flight,  chair lift Discharge Home Access: Stairs to enter Entrance Stairs-Rails: None Entrance Stairs-Number of Steps: 2 Discharge Bathroom Shower/Tub: Tub/shower unit Discharge Bathroom Toilet: Handicapped height Discharge Bathroom Accessibility: Yes How Accessible: Accessible via wheelchair, Accessible via walker   Social/Family/Support Systems Patient Roles: Other (Comment) Contact Information: [phone] Anticipated Caregiver: Montavious Wierzba Anticipated Caregiver's Contact Information: [phone] Caregiver Availability: 24/7 Discharge Plan Discussed with Primary Caregiver: Yes Is Caregiver In Agreement with Plan?: Yes Does Caregiver/Family have Issues with Lodging/Transportation while Pt is in Rehab?: Yes   Goals Patient/Family Goal for Rehab: PT/OT/SLP Supervision Expected length of stay: 14-16 days Pt/Family Agrees to Admission and willing to participate: Yes Program Orientation Provided & Reviewed with Pt/Caregiver Including Roles  & Responsibilities: Yes   Decrease burden of Care through IP rehab admission: Specialzed equipment needs, Diet advancement, Decrease number of caregivers, Bowel and bladder program, and Patient/family education   Possible need for SNF placement upon discharge: not anticipated.   Patient Condition: I have reviewed medical records from [facility], spoken with CM, and patient and daughter. I met with patient at the bedside for inpatient rehabilitation assessment.  Patient will benefit from ongoing PT, OT, and SLP, can actively  participate in 3 hours of therapy a day 5 days of the week, and can make measurable gains during the admission.  Patient will also benefit from the coordinated team approach during an Inpatient Acute Rehabilitation admission.  The patient will receive intensive therapy as well as Rehabilitation physician, nursing, social worker, and care management interventions.  Due to safety, skin/wound care, disease management, medication administration, pain management, and patient education the patient requires 24 hour a day rehabilitation nursing.  The patient is currently mod A with mobility and basic ADLs.  Discharge setting and therapy post discharge at home with home health is anticipated.  Patient has agreed to participate in the Acute Inpatient Rehabilitation Program and will admit today.   Preadmission Screen Completed By:  Genella Mech with updates by Gayland Curry, 03/08/2021 3:13 PM ______________________________________________________________________   Discussed status with Dr. Ranell Patrick on 03/10/21  at 11:25 AM and received approval for admission today.   Admission Coordinator:  Genella Mech, CCC-SLP with updates by Gayland Curry, MS, CCC-SLP, time 11:26 AM/Date 03/10/21     Assessment/Plan: Diagnosis: Debility Does the need for close, 24 hr/day Medical supervision in concert with the patient's rehab needs make it unreasonable for this patient to be served in a less intensive setting? Yes Co-Morbidities requiring supervision/potential complications: symptomatic anemia, GI bleed, atrial flutter, CAD, dysphagia, protein malnutrition Due to bladder management, bowel management, safety, skin/wound care, disease management, medication administration, pain management, and patient education, does the patient require 24 hr/day rehab nursing? Yes Does the patient require coordinated care of a physician, rehab nurse, PT, OT, and SLP to address physical and functional deficits in the context of  the above medical diagnosis(es)? Yes Addressing deficits in the following areas: balance, endurance, locomotion, strength, transferring, bowel/bladder control, bathing, dressing, feeding, grooming, toileting, cognition, and psychosocial support Can the patient actively participate in an intensive therapy program of at least 3 hrs of therapy 5 days a week? Yes The potential for patient to make measurable gains while on inpatient rehab is excellent Anticipated functional outcomes upon discharge from inpatient rehab: supervision PT, supervision OT, supervision SLP Estimated rehab length of stay to reach the above functional goals is: 10-14 days Anticipated discharge destination: Home 10. Overall Rehab/Functional Prognosis: excellent     MD Signature: Martha Clan  Ranell Patrick, MD

## 2021-03-10 NOTE — TOC Transition Note (Signed)
Transition of Care (TOC) - CM/SW Discharge Note Marvetta Gibbons RN, BSN Transitions of Care Unit 4E- RN Case Manager See Treatment Team for direct phone #    Patient Details  Name: Gregory [name] MRN: [id number] Date of Birth: 1920-12-29  Transition of Care [facility]) CM/SW Contact:  Dawayne Patricia, RN Phone Number: 03/10/2021, 3:21 PM   Clinical Narrative:    Have been notified by [facility] with [facility] re[facility]- pt has bed available today and family has accepted bed offer.  Pt stable for transition to [place] rehab at [facility] this afternoon.  CM has notified Malachy Mood with [facility] of updated plan for INPT rehab prior to return home.  [facility] working with family for hospital bed at home, bed has been delivered, with APP mattress. Son is not satisfied with mattress, [facility] to f/u and see if something missing from delivery or another option available (spoke with [place])  Son and daughter also have information on private duty care if needed post rehab stay. [place] was to meet with them and will f/u prior to return home post rehab.      Final next level of care: IP Rehab Facility Barriers to Discharge: Barriers Resolved   Patient Goals and CMS Choice Patient states their goals for this hospitalization and ongoing recovery are:: return home CMS Medicare.gov Compare Post Acute Care list provided to:: Other (Comment Required) Choice offered to / list presented to : Adult Children  Discharge Placement               [facility] re[facility]        Discharge Plan and Services In-house Referral: Clinical Social Work Discharge Planning Services: CM Consult Post Acute Care Choice: IP Rehab          DME Arranged: Hospital bed DME Agency: [facility] Date DME Agency Contacted: 03/06/21 Time DME Agency Contacted: 1208 Representative spoke with at DME Agency: [place]: RN, PT, OT [place] Agency: [place] Date [place]: 03/06/21 Time [place]:  1208 Representative spoke with at [place]: [place] ([place]) Interventions     Readmission Risk Interventions Readmission Risk Prevention Plan 03/10/2021  Transportation Screening Complete  PCP or Specialist Appt within 5-7 Days Complete  Home Care Screening Complete  Medication Review (RN CM) Complete  Some recent data might be hidden

## 2021-03-10 NOTE — Procedures (Signed)
Interventional Radiology Procedure Note  T-fastener  Procedure explained to patient and family.  All of the patient's questions were answered, patient is agreeable to proceed.  PROCEDURE SUMMARY:  Sites cleaned with alcohol swabs.  Each fastener was lifted from the skin surface and external suture was clipped.  This was repeated for each of the 3 t-fasteners surrounding indwelling g-tube.  No complications.   EBL = none   Jeffie Widdowson PA-C 03/10/2021 12:45 PM

## 2021-03-10 NOTE — Progress Notes (Signed)
Inpatient Rehab Admissions Coordinator:  There is a bed available in CIR for pt to admit today. Dr. Marianna Payment is aware and in agreement. Pt, family, NSG, and TOC made aware.  Gayland Curry, [place], [place] Admissions Coordinator [phone]

## 2021-03-10 NOTE — Progress Notes (Addendum)
Report given to RN on 4MW.  Daymon Larsen, RN

## 2021-03-11 DIAGNOSIS — I483 Typical atrial flutter: Secondary | ICD-10-CM

## 2021-03-11 DIAGNOSIS — R1312 Dysphagia, oropharyngeal phase: Secondary | ICD-10-CM

## 2021-03-11 DIAGNOSIS — N1831 Chronic kidney disease, stage 3a: Secondary | ICD-10-CM

## 2021-03-11 DIAGNOSIS — K5901 Slow transit constipation: Secondary | ICD-10-CM

## 2021-03-11 LAB — BASIC METABOLIC PANEL
Anion gap: 8 (ref 5–15)
BUN: 35 mg/dL — ABNORMAL HIGH (ref 8–23)
CO2: 25 mmol/L (ref 22–32)
Calcium: 8.4 mg/dL — ABNORMAL LOW (ref 8.9–10.3)
Chloride: 104 mmol/L (ref 98–111)
Creatinine, Ser: 1.4 mg/dL — ABNORMAL HIGH (ref 0.61–1.24)
GFR, Estimated: 45 mL/min — ABNORMAL LOW (ref >60)
Glucose, Bld: 104 mg/dL — ABNORMAL HIGH (ref 70–99)
Potassium: 5.2 mmol/L — ABNORMAL HIGH (ref 3.5–5.1)
Sodium: 137 mmol/L (ref 135–145)

## 2021-03-11 LAB — GLUCOSE, CAPILLARY
Glucose-Capillary: 101 mg/dL — ABNORMAL HIGH (ref 70–99)
Glucose-Capillary: 130 mg/dL — ABNORMAL HIGH (ref 70–99)
Glucose-Capillary: 81 mg/dL (ref 70–99)
Glucose-Capillary: 146 mg/dL — ABNORMAL HIGH (ref 70–99)

## 2021-03-11 LAB — CBC
HCT: 31 % — ABNORMAL LOW (ref 39.0–52.0)
Hemoglobin: 10.2 g/dL — ABNORMAL LOW (ref 13.0–17.0)
MCH: 31.7 pg (ref 26.0–34.0)
MCHC: 32.9 g/dL (ref 30.0–36.0)
MCV: 96.3 fL (ref 80.0–100.0)
Platelets: 101 10*3/uL — ABNORMAL LOW (ref 150–400)
RBC: 3.22 MIL/uL — ABNORMAL LOW (ref 4.22–5.81)
RDW: 17.9 % — ABNORMAL HIGH (ref 11.5–15.5)
WBC: 10.6 10*3/uL — ABNORMAL HIGH (ref 4.0–10.5)
nRBC: 0 % (ref 0.0–0.2)

## 2021-03-11 MED ORDER — SODIUM ZIRCONIUM CYCLOSILICATE 5 G PO PACK
5.0000 g | PACK | Freq: Every day | ORAL | Status: AC
  Administered 2021-03-11 – 2021-03-12 (×2): 5 g
  Filled 2021-03-11 (×2): qty 1

## 2021-03-11 NOTE — Evaluation (Signed)
Physical Therapy Assessment and Plan  Patient Details  Name: Gregory [name] MRN: [id number] Date of Birth: 1920-03-21  PT Diagnosis: Abnormal posture, Abnormality of gait, Cognitive deficits, Difficulty walking, Impaired cognition, Impaired sensation, and Muscle weakness Rehab Potential: Good ELOS: ~2-2.5 weeks   Today's Date: 03/11/2021 PT Individual Time: 1107-1210 PT Individual Time Calculation (min): 63 min    Hospital Problem: Principal Problem:   Debility   Past Medical History:  Past Medical History:  Diagnosis Date   Atrial flutter ([place])    s/p ablation   Cancer ([place])    skin cancer   Cataracts, bilateral    Hypertension    Neuromuscular disorder ([place])    Pacemaker 2010   Thyroid disease    Past Surgical History:  Past Surgical History:  Procedure Laterality Date   APPENDECTOMY     COLONOSCOPY     EYE SURGERY Bilateral    cataract surgery   HERNIA REPAIR Right    INGUINAL HERNIA REPAIR Left 10/12/2013   Procedure: LEFT  INGUINAL HERNIA REPAIR;  Surgeon: Odis Hollingshead, MD;  Location: [facility];  Service: General;  Laterality: Left;   INSERTION OF MESH Left 10/12/2013   Procedure: INSERTION OF MESH;  Surgeon: Odis Hollingshead, MD;  Location: [facility];  Service: General;  Laterality: Left;   PEG TUBE PLACEMENT Left     Assessment & Plan Clinical Impression: Patient is a 88 y.o. year old male with history of A Flutter s/p ablation w/PPM, HTN, neuropathy, lung nodule, TVAR, HOH, dysphagia s/p G tube (placed in [place] 02/10/21) who was admitted on 03/02/21 with hypotension, dizziness, near syncope and hgb 5.9.  He was treated with fluid bolus and 2 units PRBC  with improvement of symptoms. FBOT positive with reports of dark stools as well as reports of Hgb of 7.0 and trauma to GI tract due to NGT per family.  Dr. Silverio Decamp recommended conservative management of NPO, IV PPI and transfusion as needed as heme positive stools felt to be related to NGT mucosal trauma and  to hold eliquis. Palliative care consulted to discuss [place] and patient/family elected on full code/full scope of practice. PEG tube noted to be loose and T-fastener removed  by radiology today. Hgb stabilized and tube feeds resumed. Patient noted to be deconditioned with posterior lean on standing, unsteady gait and difficulty completing ADLs. CIR recommended due to functional decline. Lived with son PTA. Patient transferred to CIR on 03/10/2021 .   Patient currently requires mod assist with mobility secondary to muscle weakness and muscle joint tightness, decreased cardiorespiratoy endurance, impaired timing and sequencing, unbalanced muscle activation, and decreased motor planning, decreased visual acuity, decreased midline orientation, decreased attention, decreased awareness, decreased problem solving, decreased safety awareness, decreased memory, and delayed processing, and decreased sitting balance, decreased standing balance, decreased postural control, and decreased balance strategies.  Prior to hospitalization, patient was modified independent  with mobility using narrow based quad cane and lived with Son in a House home.  Home access is 2Stairs to enter.  Patient will benefit from skilled PT intervention to maximize safe functional mobility, minimize fall risk, and decrease caregiver burden for planned discharge home with 24 hour supervision.  Anticipate patient will benefit from follow up [place] at discharge.  PT - End of Session Activity Tolerance: Tolerates 30+ min activity with multiple rests Endurance Deficit: Yes Endurance Deficit Description: requires frequent seated rest breaks with impaired endurance PT Assessment Rehab Potential (ACUTE/IP ONLY): Good PT Barriers to Discharge: [place] home environment;Home  environment access/layout;Nutrition means PT Patient demonstrates impairments in the following area(s): Balance;Perception;Behavior;Safety;Edema;Sensory;Endurance;Skin  Integrity;Motor;Nutrition;Pain PT Transfers Functional Problem(s): Bed Mobility;Bed to Chair;Car;Furniture PT Locomotion Functional Problem(s): Ambulation;Stairs PT Plan PT Intensity: Minimum of 1-2 x/day ,45 to 90 minutes PT Frequency: 5 out of 7 days PT Duration Estimated Length of Stay: ~2-2.5 weeks PT Treatment/Interventions: Ambulation/gait training;Community reintegration;DME/adaptive equipment instruction;Neuromuscular re-education;Psychosocial support;Stair training;UE/LE Strength taining/ROM;Balance/vestibular training;Discharge planning;Functional electrical stimulation;Pain management;Skin care/wound management;UE/LE Coordination activities;Cognitive remediation/compensation;Therapeutic Activities;Disease management/prevention;Functional mobility training;Patient/family education;Splinting/orthotics;Therapeutic Exercise;Visual/perceptual remediation/compensation PT Transfers Anticipated Outcome(s): supervision using LRAD PT Locomotion Anticipated Outcome(s): supervision using LRAD PT Recommendation Follow Up Recommendations: Home health PT;24 hour supervision/assistance Patient destination: Home Equipment Recommended: To be determined Equipment Details: has hospital bed, RW, and narrow based quad cane   PT Evaluation Precautions/Restrictions Precautions Precautions: Fall Precaution Comments: NPO with PEG tube, posterior lean Restrictions Weight Bearing Restrictions: No Pain Pain Assessment Pain Scale: 0-10 Pain Score: 0-No pain Pain Interference Pain Interference Pain Effect on Sleep: 1. Rarely or not at all Pain Interference with Therapy Activities: 1. Rarely or not at all Pain Interference with Day-to-Day Activities: 1. Rarely or not at all Home Living/Prior [place] Available Help at Discharge: Family;Available 24 hours/day;Personal care attendant (family had previously hired 24hr caregiver support and could provide that again if needed) Type of Home:  House Home Access: Stairs to enter [employer] of Steps: 2 Entrance Stairs-Rails: None Home Layout: Two level;Able to live on main level with bedroom/bathroom;1/2 bath on main level;Bed/bath upstairs  Lives With: Son [name]) Prior Function Level of Independence: Independent with gait;Independent with transfers;Independent with homemaking with ambulation (mod-I using narrow based quad cane)  Able to Take Stairs?: Yes Driving: No Vocation: Retired Radiographer, therapeutic - History Ability to See in Adequate Light: 3 Highly impaired Perception Perception: Within Functional Limits Praxis Praxis: Impaired Praxis Impairment Details: Initiation;Motor planning  Cognition Overall Cognitive Status: Impaired/Different from baseline Arousal/Alertness: Awake/alert Orientation Level: Oriented X4 Year: 2023 Month: January Day of Week: Incorrect Memory: Impaired Awareness: Impaired Awareness Impairment: Emergent impairment Problem Solving: Impaired Problem Solving Impairment: Functional basic Safety/Judgment: Impaired Comments: Decreased judgement in regards to balance loses during functional activity Sensation Sensation Light Touch: Impaired Detail Peripheral sensation comments: impaired distally in feet bilaterally Light Touch Impaired Details: Impaired RLE;Impaired LLE Hot/Cold: Not tested Proprioception: Impaired by gross assessment Stereognosis: Not tested Coordination Gross Motor Movements are Fluid and Coordinated: No Coordination and Movement Description: GM movements impared due to generalized weakness and deconditioning as well as impaired balance Motor  Motor Motor: Other (comment);Abnormal postural alignment and control Motor - Skilled Clinical Observations: generalized weakness and deconditioning with impaired balance   Trunk/Postural Assessment  Cervical Assessment Cervical Assessment: Exceptions to [facility] (forward head) Thoracic Assessment Thoracic  Assessment: Exceptions to [facility] (kyphotic posturing with rounded shoulders) Lumbar Assessment Lumbar Assessment: Exceptions to [facility] (posterior pelvic tilt) Postural Control Postural Control: Deficits on evaluation Righting Reactions: delayed and insufficient with posterior lean bias Protective Responses: delayed and insufficient with posterior lean bias  Balance Balance Balance Assessed: Yes Static Sitting Balance Static Sitting - Balance Support: Feet supported;Bilateral upper extremity supported Static Sitting - Level of Assistance: Other (comment) (CGA) Dynamic Sitting Balance Dynamic Sitting - Balance Support: Feet supported;Bilateral upper extremity supported Dynamic Sitting - Level of Assistance: 3: Mod assist;4: Min assist Static Standing Balance Static Standing - Balance Support: During functional activity;Right upper extremity supported Static Standing - Level of Assistance: 3: Mod assist Dynamic Standing Balance Dynamic Standing - Balance Support: During functional activity Dynamic Standing -  Level of Assistance: 2: Max assist Extremity Assessment      RLE Assessment RLE Assessment: Exceptions to [facility] Active Range of Motion (AROM) Comments: WFL except tightness noted in heel cords General Strength Comments: strength grossly 4-/5 to 4/5 throughout observed functionally LLE Assessment LLE Assessment: Exceptions to [facility] Active Range of Motion (AROM) Comments: WFL except tightness noted in heel cords General Strength Comments: strength grossly 4-/5 to 4/5 throughout observed functionally  Care Tool Care Tool Bed Mobility Roll left and right activity   Roll left and right assist level: Moderate Assistance - Patient 50 - 74%    Sit to lying activity   Sit to lying assist level: Moderate Assistance - Patient 50 - 74%    Lying to sitting on side of bed activity   Lying to sitting on side of bed assist level: the ability to move from lying on the back to sitting on the side of  the bed with no back support.: Moderate Assistance - Patient 50 - 74%     Care Tool Transfers Sit to stand transfer   Sit to stand assist level: Moderate Assistance - Patient 50 - 74%    Chair/bed transfer   Chair/bed transfer assist level: Moderate Assistance - Patient 50 - 74%     Physiological scientist transfer assist level: Moderate Assistance - Patient 50 - 74%      Care Tool Locomotion Ambulation Ambulation activity did not occur: Safety/medical concerns (pt uses QC at baseline but requires use of RW for safe ambulation)        Walk 10 feet activity Walk 10 feet activity did not occur: Safety/medical concerns       Walk 50 feet with 2 turns activity Walk 50 feet with 2 turns activity did not occur: Safety/medical concerns      Walk 150 feet activity Walk 150 feet activity did not occur: Safety/medical concerns      Walk 10 feet on uneven surfaces activity Walk 10 feet on uneven surfaces activity did not occur: Safety/medical concerns      Stairs Stair activity did not occur: Safety/medical concerns        Walk up/down 1 step activity Walk up/down 1 step or curb (drop down) activity did not occur: Safety/medical concerns      Walk up/down 4 steps activity Walk up/down 4 steps activity did not occur: Safety/medical concerns      Walk up/down 12 steps activity Walk up/down 12 steps activity did not occur: Safety/medical concerns      Pick up small objects from floor   Pick up small object from the floor assist level: Total Assistance - Patient < 25%    Wheelchair Is the patient using a wheelchair?: Yes Type of Wheelchair: Manual   Wheelchair assist level: Dependent - Patient 0%    Wheel 50 feet with 2 turns activity   Assist Level: Dependent - Patient 0%  Wheel 150 feet activity   Assist Level: Dependent - Patient 0%    Refer to Care Plan for Long Term Goals  SHORT TERM GOAL WEEK 1 PT Short Term Goal 1 (Week 1): Pt will perform  supine<>sit with min assist PT Short Term Goal 2 (Week 1): Pt will perform sit<>stand transfers using LRAD with min assist PT Short Term Goal 3 (Week 1): Pt will perform stand pivot transfers using LRAD with min assist PT Short Term Goal 4 (Week 1): Pt will ambulate  at least 11f using LRAD with min assist PT Short Term Goal 5 (Week 1): Pt will navigate 4 stairs using HRs with min assist  Recommendations for other services: None   Skilled Therapeutic Intervention Pt received sitting in recliner with his daughter, JKennyth Lose present and pt agreeable to therapy session. Evaluation completed (see details above) with patient education regarding purpose of PT evaluation, PT POC and goals, therapy schedule, weekly team meetings, and other CIR information including safety plan and fall risk safety. Pt performed the below functional mobility tasks with the specified skilled assistance and cuing. Pt demos poor motor planning and sequencing of sit>stand transfers resulting in heavy posterior lean and pt appears fearful of falling as when therapist facilitates anterior trunk lean pt resists this - pt also slow to rise. At end of session, pt left seated in recliner with needs in reach, B LEs elevated with heels floating off pillow, and chair alarm on.   Mobility Bed Mobility Bed Mobility: Supine to Sit;Sit to Supine Supine to Sit: Moderate Assistance - Patient 50-74% Sit to Supine: Moderate Assistance - Patient 50-74% Transfers Transfers: Sit to Stand;Stand to Sit;Stand Pivot Transfers Sit to Stand: Moderate Assistance - Patient 50-74% Stand to Sit: Moderate Assistance - Patient 50-74% Stand Pivot Transfers: Moderate Assistance - Patient 50 - 74% Stand Pivot Transfer Details: Tactile cues for sequencing;Manual facilitation for weight shifting;Verbal cues for safe use of DME/AE;Verbal cues for technique;Verbal cues for sequencing;Verbal cues for gait pattern;Tactile cues for weight shifting;Verbal cues for  precautions/safety;Visual cues/gestures for sequencing;Tactile cues for weight beaing;Tactile cues for posture;Tactile cues for initiation Transfer (Assistive device): Rolling walker Locomotion  Gait Ambulation: Yes Gait Assistance: Minimal Assistance - Patient > 75%;Moderate Assistance - Patient 50-74% Gait Distance (Feet): 80 Feet Assistive device: Rolling walker Gait Assistance Details: Tactile cues for sequencing;Tactile cues for weight shifting;Tactile cues for posture;Visual cues for safe use of DME/AE;Visual cues/gestures for precautions/safety;Visual cues/gestures for sequencing;Verbal cues for precautions/safety;Verbal cues for technique;Verbal cues for gait pattern;Verbal cues for safe use of DME/AE;Manual facilitation for weight shifting;Verbal cues for sequencing Gait Gait: Yes Gait Pattern: Impaired Gait Pattern: Narrow base of support;Decreased step length - left;Decreased step length - right;Decreased stride length;Lateral trunk lean to left;Trunk flexed;Poor foot clearance - left;Poor foot clearance - right (posterior lean) Gait velocity: Decreased Stairs / Additional Locomotion Stairs: No Wheelchair Mobility Wheelchair Mobility: No   Discharge Criteria: Patient will be discharged from PT if patient refuses treatment 3 consecutive times without medical reason, if treatment goals not met, if there is a change in medical status, if patient makes no progress towards goals or if patient is discharged from hospital.  The above assessment, treatment plan, treatment alternatives and goals were discussed and mutually agreed upon: by patient and by family  CTawana Scale, PT, DPT, NCS, CSRS  03/11/2021, 8:08 AM

## 2021-03-11 NOTE — Progress Notes (Addendum)
PROGRESS NOTE   Subjective/Complaints: Pt says he slept fairly well. Denied pain.   ROS: Limited due to cognitive/behavioral/hearing   Objective:   No results found. Recent Labs    03/10/21 0141 03/11/21 0537  WBC 8.1 10.6*  HGB 8.9* 10.2*  HCT 28.2* 31.0*  PLT 93* 101*   Recent Labs    03/10/21 0141 03/11/21 0537  NA 138 137  K 4.7 5.2*  CL 107 104  CO2 25 25  GLUCOSE 152* 104*  BUN 37* 35*  CREATININE 1.35* 1.40*  CALCIUM 7.9* 8.4*    Intake/Output Summary (Last 24 hours) at 03/11/2021 1118 Last data filed at 03/11/2021 0654 Gross per 24 hour  Intake 260 ml  Output 1050 ml  Net -790 ml     Pressure Injury 03/03/21 Sacrum Mid (Active)  03/03/21 1108  Location: Sacrum  Location Orientation: Mid  Staging:   Wound Description (Comments):   Present on Admission:      Pressure Injury 03/03/21 Heel Right Stage 1 -  Intact skin with non-blanchable redness of a localized area usually over a bony prominence. (Active)  03/03/21 1110  Location: Heel  Location Orientation: Right  Staging: Stage 1 -  Intact skin with non-blanchable redness of a localized area usually over a bony prominence.  Wound Description (Comments):   Present on Admission: Yes     Pressure Injury 03/03/21 Heel Left Deep Tissue Pressure Injury - Purple or maroon localized area of discolored intact skin or blood-filled blister due to damage of underlying soft tissue from pressure and/or shear. (Active)  03/03/21 1114  Location: Heel  Location Orientation: Left  Staging: Deep Tissue Pressure Injury - Purple or maroon localized area of discolored intact skin or blood-filled blister due to damage of underlying soft tissue from pressure and/or shear.  Wound Description (Comments):   Present on Admission: Yes     Pressure Injury 03/03/21 Foot Right;Lateral Stage 1 -  Intact skin with non-blanchable redness of a localized area usually over a  bony prominence. (Active)  03/03/21 1115  Location: Foot  Location Orientation: Right;Lateral  Staging: Stage 1 -  Intact skin with non-blanchable redness of a localized area usually over a bony prominence.  Wound Description (Comments):   Present on Admission: Yes     Pressure Injury 03/03/21 Foot Left;Lateral Stage 1 -  Intact skin with non-blanchable redness of a localized area usually over a bony prominence. (Active)  03/03/21 1115  Location: Foot  Location Orientation: Left;Lateral  Staging: Stage 1 -  Intact skin with non-blanchable redness of a localized area usually over a bony prominence.  Wound Description (Comments):   Present on Admission: Yes    Physical Exam: Vital Signs Blood pressure (!) 129/55, pulse 70, temperature 98 F (36.7 C), temperature source Oral, resp. rate 17, height 5\' 8"  (1.727 m), weight 73 kg, SpO2 91 %.  General: Alert and oriented x 3, No apparent distress. Frail appearing HEENT: Head is normocephalic, atraumatic, PERRLA, EOMI, sclera anicteric, oral mucosa pink and moist, dentition intact, ext ear canals clear,  Neck: Supple without JVD or lymphadenopathy Heart: IRR IRR. No murmurs rubs or gallops Chest: CTA bilaterally without wheezes, rales, or rhonchi;  no distress Abdomen: Soft, non-tender, non-distended, bowel sounds positive. +PEG Extremities: No clubbing, cyanosis, or edema. Pulses are 2+ Psych: Pt's affect is appropriate. Pt is cooperative Skin: foot/heel wounds Neuro:  HOH but seems to comprehend when he's able to hear. No focal language issues. Mild dysarthria. Strength grossly 4/5 bilateral UE/LE's. No focal sensory abnl. No abnl tone.  Musculoskeletal: Full ROM, No pain with AROM or PROM in the neck, trunk, or extremities. Posture appropriate     Assessment/Plan: 1. Functional deficits which require 3+ hours per day of interdisciplinary therapy in a comprehensive inpatient rehab setting. Physiatrist is providing close team supervision  and 24 hour management of active medical problems listed below. Physiatrist and rehab team continue to assess barriers to discharge/monitor patient progress toward functional and medical goals  Care Tool:  Bathing  Bathing activity did not occur: Safety/medical concerns           Bathing assist       Upper Body Dressing/Undressing Upper body dressing        Upper body assist      Lower Body Dressing/Undressing Lower body dressing      What is the patient wearing?: Incontinence brief     Lower body assist Assist for lower body dressing: Maximal Assistance - Patient 25 - 49%     Toileting Toileting    Toileting assist Assist for toileting: Dependent - Patient 0%     Transfers Chair/bed transfer  Transfers assist           Locomotion Ambulation   Ambulation assist              Walk 10 feet activity   Assist           Walk 50 feet activity   Assist           Walk 150 feet activity   Assist           Walk 10 feet on uneven surface  activity   Assist           Wheelchair     Assist               Wheelchair 50 feet with 2 turns activity    Assist            Wheelchair 150 feet activity     Assist          Blood pressure (!) 129/55, pulse 70, temperature 98 F (36.7 C), temperature source Oral, resp. rate 17, height 5\' 8"  (1.727 m), weight 73 kg, SpO2 91 %.  Medical Problem List and Plan: 1. Functional deficits secondary to debility             -patient may shower but PEG must be covered.              -ELOS/Goals: 2-3 weeks S-MinA             -Patient is beginning CIR therapies today including PT, OT, and SLP  2.  Antithrombotics: -DVT/anticoagulation:  Mechanical: Sequential compression devices, below knee Bilateral lower extremities             -antiplatelet therapy: N/A 3. Pain Management: Tylenol 4. Mood:  LCSW to follow              -antipsychotic agents: N/A 5. Neuropsych:  This patient is capable of making decisions on his own behalf. 6. Skin/Wound Care: pressure relief, protection to feet/heels 7. Fluids/Electrolytes/Nutrition: continue TF  -hyperkalemia: lokelma 5mg  daily  x 2 doses, recheck labs Monday 8. A flutter s/p TAVR 2020: plan is to hold eliquis for now and resume in the next few weeks potentially --HR controlled 1/28 9. Severe dysphagia w/aspiration: NPO with tube feeds for nutritonal support.  10. GIB: Monitor for signs of bleeding. Continue Protonix and Carafate. Holding Eliquis.  -1/28 hgb up to 10.6 today. 11. CKD 3a: BUN/SCr 124/2.07             --improved to 35/1.4 on 1/28  -monitor output, labs 12. Constipation: Continue Senna daily.  13. HLD: continue atorvastatin 40mg  daily.  14. Hypothyroidism: continue Synthroid.     LOS: 1 days A FACE TO FACE EVALUATION WAS PERFORMED  Meredith Staggers 03/11/2021, 11:18 AM

## 2021-03-11 NOTE — Progress Notes (Signed)
Occupational Therapy Session Note  Patient Details  Name: Gregory [name] MRN: [id number] Date of Birth: [birth year]  Today's Date: 03/12/2021 OT Individual Time: 2355-7322 OT Individual Time Calculation (min): 59 min    Short Term Goals: Week 1:  OT Short Term Goal 1 (Week 1): Pt will complete sit<stand from toilet with no more than Mod A OT Short Term Goal 2 (Week 1): Pt will complete UB dressing with Mod A OT Short Term Goal 3 (Week 1): Pt will complete 1/3 components of donning pants using AE or adaptive techniques as needed  Skilled Therapeutic Interventions/Progress Updates:    Pt greeted in bed, asleep, easily woken. Pt unable to put in hearing aide by himself due to hand tremors. Increased time for pt to explain method to OT with pts explanation being inaccurate in regards to properly putting in hearing aide. Supine<sit completed with CGA. Pt reported not knowing if his brief was clean or not, found to be clean and therefore worked on UB/LB dressing tasks at sit<stand level. Pt declined bathing this AM. Son Gregory [name] present, brought in loose jogging pants from home however pt preferring khaki pants with button + zipper. He required Max A for donning pants given increased time and balance assistance. Note that pt had multiple posterior LOBs and OT placed pillows behind him to increase balance awareness and improve pts ability to correct balance loses. Pt required CGA for dynamic activity, OT provided Min A at most to recover seated LOBs but pt encouraged to do this unassisted as much as possible. Mod A for sit<stands from low bed using RW with vcs for hand placement and anterior weight shifting. Pt able to manage his zipper with Mod balance assistance, pt with posterior lean. Mod A for donning button up shirt with velcro buttons. Note that pt required multiple reclined rest breaks to complete dressing tasks at max level of independence. After ADL retraining, focus placed on dynamic standing balance  with pt ambulating ~11 ft and then 15 ft in the room using RW with Min A, pt with small shuffling steps, and per family this was baseline. Min-Mod A for sit<stands using device with vcs. He transferred to the recliner at close of session, left with all needs within reach and chair alarm set.   Discussed with pt, son, and nurse about pt benefiting from abdominal binder to better protect his PEG site during mobility  Therapy Documentation Precautions:  Precautions Precautions: Fall Precaution Comments: NPO with PEG tube, posterior lean Restrictions Weight Bearing Restrictions: No Pain: pt denied pain during tx   ADL: ADL Eating: NPO Grooming: Moderate assistance (due to hand tremors and strength deficits) Where Assessed-Grooming: Chair Upper Body Bathing: Moderate assistance (for sitting balance) Where Assessed-Upper Body Bathing: Edge of bed Lower Body Bathing: Maximal assistance Where Assessed-Lower Body Bathing: Edge of bed Upper Body Dressing: Maximal assistance Where Assessed-Upper Body Dressing: Edge of bed Lower Body Dressing: Dependent Where Assessed-Lower Body Dressing: Edge of bed Toileting: Maximal assistance Where Assessed-Toileting: Glass blower/designer: Moderate assistance (Min A ambulatory transfer to the toilet using RW, Max A for sit<stand from standard toilet. Per daughter, pt has elevated toilet seat at home with toilet bars) Tub/Shower Transfer: Not assessed   Therapy/Group: Individual Therapy  Gregory [name] A Gregory [name] 03/12/2021, 12:33 PM

## 2021-03-11 NOTE — Evaluation (Signed)
Speech Language Pathology Assessment and Plan  Patient Details  Name: Gregory [name] MRN: [id number] Date of Birth: 10-07-1920  SLP Diagnosis: Cognitive Impairments  Rehab Potential: Good ELOS: 2-2.5 weeks    Today's Date: 03/11/2021 SLP Individual Time: 1305-1410 SLP Individual Time Calculation (min): 39 min   Hospital Problem: Principal Problem:   Debility  Past Medical History:  Past Medical History:  Diagnosis Date   Atrial flutter ([place])    s/p ablation   Cancer ([place])    skin cancer   Cataracts, bilateral    Hypertension    Neuromuscular disorder ([place])    Pacemaker 2010   Thyroid disease    Past Surgical History:  Past Surgical History:  Procedure Laterality Date   APPENDECTOMY     COLONOSCOPY     EYE SURGERY Bilateral    cataract surgery   HERNIA REPAIR Right    INGUINAL HERNIA REPAIR Left 10/12/2013   Procedure: LEFT  INGUINAL HERNIA REPAIR;  Surgeon: Odis Hollingshead, MD;  Location: [facility];  Service: General;  Laterality: Left;   INSERTION OF MESH Left 10/12/2013   Procedure: INSERTION OF MESH;  Surgeon: Odis Hollingshead, MD;  Location: [facility];  Service: General;  Laterality: Left;   PEG TUBE PLACEMENT Left     Assessment / Plan / Recommendation Clinical Impression Patient is a 88 year old male with history of A Flutter s/p ablation w/PPM, HTN, neuropathy, lung nodule, TVAR, HOH, dysphagia s/p G tube (placed in [place] 02/10/21) who was admitted on 03/02/21 with hypotension, dizziness, near syncope and hgb 5.9.  He was treated with fluid bolus and 2 units PRBC  with improvement of symptoms. FBOT positive with reports of dark stools as well as reports of Hgb of 7.0 and trauma to GI tract due to NGT per family.  Dr. Silverio Decamp recommended conservative management of NPO, IV PPI and transfusion as needed as heme positive stools felt to be related to NGT mucosal trauma and to hold eliquis. Palliative care consulted to discuss [place] and patient/family elected on full  code/full scope of practice. PEG tube noted to be loose and T-fastener removed  by radiology today. Hgb stabilized and tube feeds resumed. Patient noted to be deconditioned with posterior lean on standing, unsteady gait and difficulty completing ADLs. CIR recommended due to functional decline. Patient admitted 03/10/21.  Patient awake and alert upon arrival while sitting upright in the recliner. Patient has an extensive and complicated history of dysphagia and patient's daughter present and provides information. Per her report, she cannot recall the patient being seen by an SLP or recommendations/education provided by SLP while at the [facility]. She reports a physician told her that his epiglottis isn't closing and that its either comfort care or PEG.  She also did not receive any education regarding the importance of frequent oral care. Per what this SLP can gather from reports via care everywhere the patient's most recent MBS was on 02/15/20 did not show overt aspiration, but did show pharyngeal residue between swallows, impaired swallow efficiency, need for multiple swallows to clear the bolus, poor endurance, and penetration with thin liquids. Patient was recommended Dys. 2 textures with thin liquids with guarded prognosis and potential risk of aspiration.    Patient reports prior to all of this, he had frequent coughing with solids and while drinking ensure but coughing was minimal with thin liquids. He also reported he was told to utilize a chin tuck in the past and was given RMT exercises which  the patient's daughter reports, he didn't follow through with.   Prior to trials, SLP provided oral care via the suction toothbrush and family was educated on the importance of QID oral care to minimize bacterial load. Patient requested to use the bathroom and required Mod-Max verbal cues for safety with task due to impulsivity and cues needed for sequencing. Patient's urine was milky and his stool was  black. Nursing made aware and changed dressing.  Throughout entirety of the session, patient with intermittent coughing and orally expectorating mucous.  Patient transferred back to the recliner and reported fatigue. Patient declined trials due to fatigue, SLP also suspects fear of aspiration/choking also impacted willingness to participate in trials.    Recommend SLP intervention to provide ongoing education and ongoing assessment of swallow function for appropriate recommendations for safe PO intake if possible.  Educated patient and daughter for possible need of a repeat MBS, both verbalized agreement.  Patient would also benefit from skilled SLP intervention to maximize overall problem solving, safety awareness, and functional independence.        Skilled Therapeutic Interventions          Administered a cognitive-linguistic evaluation, please see above for details.   SLP Assessment  Patient will need skilled [facility] during CIR admission    Recommendations  Oral Care Recommendations: Oral care QID Patient destination: Home Follow up Recommendations: Home Health SLP;24 hour supervision/assistance Equipment Recommended: To be determined    SLP Frequency 3 to 5 out of 7 days   SLP Duration  SLP Intensity  SLP Treatment/Interventions 2-2.5 weeks  Minumum of 1-2 x/day, 30 to 90 minutes  Cognitive remediation/compensation;Cueing hierarchy;Environmental controls;Therapeutic Activities;Functional tasks;Patient/family education    Pain Pain Assessment Pain Scale: 0-10 Pain Score: 0-No pain  Prior Functioning Type of Home: House  Lives With: Son Available Help at Discharge: Family;Available 24 hours/day;Personal care attendant Vocation: Retired  SLP Evaluation Cognition Overall Cognitive Status: Impaired/Different from baseline Arousal/Alertness: Awake/alert Orientation Level: Oriented X4 Year: 2023 Month: January Day of Week: Incorrect Memory:  Impaired Immediate Memory Recall: Sock;Blue;Bed Memory Recall Sock: With Cue Memory Recall Blue: Without Cue Memory Recall Bed: Not able to recall Awareness: Impaired Awareness Impairment: Emergent impairment Problem Solving: Impaired Problem Solving Impairment: Functional basic Safety/Judgment: Impaired Comments: Decreased judgement in regards to balance loses during functional activity  Comprehension Auditory Comprehension Overall Auditory Comprehension: Appears within functional limits for tasks assessed (patient is hard of hearing) Expression Expression Primary Mode of Expression: Verbal Verbal Expression Overall Verbal Expression: Appears within functional limits for tasks assessed Written Expression Dominant Hand: Right Oral Motor Oral Motor/Sensory Function Overall Oral Motor/Sensory Function: Within functional limits Motor Speech Overall Motor Speech: Impaired Respiration: Within functional limits Phonation: Hoarse;Low vocal intensity Resonance: Within functional limits Articulation: Within functional limitis Intelligibility: Intelligibility reduced Word: 75-100% accurate Phrase: 75-100% accurate Sentence: 75-100% accurate Effective Techniques: Increased vocal intensity  Care Tool Care Tool Cognition Ability to hear (with hearing aid or hearing appliances if normally used Ability to hear (with hearing aid or hearing appliances if normally used): 3. Highly impaired - absence of useful hearing   Expression of Ideas and Wants Expression of Ideas and Wants: 3. Some difficulty - exhibits some difficulty with expressing needs and ideas (e.g, some words or finishing thoughts) or speech is not clear   Understanding Verbal and Non-Verbal Content Understanding Verbal and Non-Verbal Content: 3. Usually understands - understands most conversations, but misses some part/intent of message. Requires cues at times to understand  Memory/Recall  Ability Memory/Recall Ability : That he  or she is in a hospital/hospital unit;Current season    Short Term Goals: Week 1: SLP Short Term Goal 1 (Week 1): Patient will demonstrate functional problem solving for basic, novel tasks (using a RW, etc)  with Min verbal cues. SLP Short Term Goal 2 (Week 1): Patient will recall the steps for basic, novel tasks (using a RW, etc)  with Mod verbal cues. SLP Short Term Goal 3 (Week 1): Patient will participate in RMT exercises to improve vocal and cough strength with Mod verbal cues for accuracy and a self-perceived effort level of 7/10.  Refer to Care Plan for Long Term Goals  Recommendations for other services: None   Discharge Criteria: Patient will be discharged from SLP if patient refuses treatment 3 consecutive times without medical reason, if treatment goals not met, if there is a change in medical status, if patient makes no progress towards goals or if patient is discharged from hospital.  The above assessment, treatment plan, treatment alternatives and goals were discussed and mutually agreed upon: by patient and by family  Gregory [name] 03/11/2021, 2:52 PM

## 2021-03-11 NOTE — Plan of Care (Signed)
°  Problem: RH Balance Goal: LTG Patient will maintain dynamic sitting balance (PT) Description: LTG:  Patient will maintain dynamic sitting balance with assistance during mobility activities (PT) Flowsheets (Taken 03/11/2021 1252) LTG: Pt will maintain dynamic sitting balance during mobility activities with:: Supervision/Verbal cueing Goal: LTG Patient will maintain dynamic standing balance (PT) Description: LTG:  Patient will maintain dynamic standing balance with assistance during mobility activities (PT) Flowsheets (Taken 03/11/2021 1252) LTG: Pt will maintain dynamic standing balance during mobility activities with:: Contact Guard/Touching assist   Problem: Sit to Stand Goal: LTG:  Patient will perform sit to stand with assistance level (PT) Description: LTG:  Patient will perform sit to stand with assistance level (PT) Flowsheets (Taken 03/11/2021 1252) LTG: PT will perform sit to stand in preparation for functional mobility with assistance level: Supervision/Verbal cueing   Problem: RH Bed Mobility Goal: LTG Patient will perform bed mobility with assist (PT) Description: LTG: Patient will perform bed mobility with assistance, with/without cues (PT). Flowsheets (Taken 03/11/2021 1252) LTG: Pt will perform bed mobility with assistance level of: Supervision/Verbal cueing   Problem: RH Bed to Chair Transfers Goal: LTG Patient will perform bed/chair transfers w/assist (PT) Description: LTG: Patient will perform bed to chair transfers with assistance (PT). Flowsheets (Taken 03/11/2021 1252) LTG: Pt will perform Bed to Chair Transfers with assistance level: Supervision/Verbal cueing   Problem: RH Car Transfers Goal: LTG Patient will perform car transfers with assist (PT) Description: LTG: Patient will perform car transfers with assistance (PT). Flowsheets (Taken 03/11/2021 1252) LTG: Pt will perform car transfers with assist:: Supervision/Verbal cueing   Problem: RH Ambulation Goal: LTG  Patient will ambulate in controlled environment (PT) Description: LTG: Patient will ambulate in a controlled environment, # of feet with assistance (PT). Flowsheets (Taken 03/11/2021 1252) LTG: Pt will ambulate in controlled environ  assist needed:: Supervision/Verbal cueing LTG: Ambulation distance in controlled environment: 23ft using LRAD Goal: LTG Patient will ambulate in home environment (PT) Description: LTG: Patient will ambulate in home environment, # of feet with assistance (PT). Flowsheets (Taken 03/11/2021 1252) LTG: Pt will ambulate in home environ  assist needed:: Supervision/Verbal cueing LTG: Ambulation distance in home environment: 124ft using LRAD   Problem: RH Stairs Goal: LTG Patient will ambulate up and down stairs w/assist (PT) Description: LTG: Patient will ambulate up and down # of stairs with assistance (PT) Flowsheets (Taken 03/11/2021 1252) LTG: Pt will ambulate up/down stairs assist needed:: Contact Guard/Touching assist LTG: Pt will  ambulate up and down number of stairs: 2 steps using handrails per home set-up

## 2021-03-11 NOTE — Plan of Care (Signed)
°  Problem: RH Balance Goal: LTG Patient will maintain dynamic standing with ADLs (OT) Description: LTG:  Patient will maintain dynamic standing balance with assist during activities of daily living (OT)  Flowsheets (Taken 03/11/2021 1248) LTG: Pt will maintain dynamic standing balance during ADLs with: Supervision/Verbal cueing   Problem: Sit to Stand Goal: LTG:  Patient will perform sit to stand in prep for activites of daily living with assistance level (OT) Description: LTG:  Patient will perform sit to stand in prep for activites of daily living with assistance level (OT) Flowsheets (Taken 03/11/2021 1248) LTG: PT will perform sit to stand in prep for activites of daily living with assistance level: Supervision/Verbal cueing   Problem: RH Grooming Goal: LTG Patient will perform grooming w/assist,cues/equip (OT) Description: LTG: Patient will perform grooming with assist, with/without cues using equipment (OT) Flowsheets (Taken 03/11/2021 1248) LTG: Pt will perform grooming with assistance level of: Supervision/Verbal cueing   Problem: RH Bathing Goal: LTG Patient will bathe all body parts with assist levels (OT) Description: LTG: Patient will bathe all body parts with assist levels (OT) Flowsheets (Taken 03/11/2021 1248) LTG: Pt will perform bathing with assistance level/cueing: Supervision/Verbal cueing   Problem: RH Dressing Goal: LTG Patient will perform upper body dressing (OT) Description: LTG Patient will perform upper body dressing with assist, with/without cues (OT). Flowsheets (Taken 03/11/2021 1248) LTG: Pt will perform upper body dressing with assistance level of: Supervision/Verbal cueing Goal: LTG Patient will perform lower body dressing w/assist (OT) Description: LTG: Patient will perform lower body dressing with assist, with/without cues in positioning using equipment (OT) Flowsheets (Taken 03/11/2021 1248) LTG: Pt will perform lower body dressing with assistance level  of: Supervision/Verbal cueing   Problem: RH Toileting Goal: LTG Patient will perform toileting task (3/3 steps) with assistance level (OT) Description: LTG: Patient will perform toileting task (3/3 steps) with assistance level (OT)  Flowsheets (Taken 03/11/2021 1248) LTG: Pt will perform toileting task (3/3 steps) with assistance level: Supervision/Verbal cueing   Problem: RH Toilet Transfers Goal: LTG Patient will perform toilet transfers w/assist (OT) Description: LTG: Patient will perform toilet transfers with assist, with/without cues using equipment (OT) Flowsheets (Taken 03/11/2021 1248) LTG: Pt will perform toilet transfers with assistance level of: Supervision/Verbal cueing   Problem: RH Tub/Shower Transfers Goal: LTG Patient will perform tub/shower transfers w/assist (OT) Description: LTG: Patient will perform tub/shower transfers with assist, with/without cues using equipment (OT) Flowsheets (Taken 03/11/2021 1248) LTG: Pt will perform tub/shower stall transfers with assistance level of: Supervision/Verbal cueing

## 2021-03-12 ENCOUNTER — Other Ambulatory Visit: Payer: Self-pay | Admitting: Cardiology

## 2021-03-12 LAB — GLUCOSE, CAPILLARY
Glucose-Capillary: 102 mg/dL — ABNORMAL HIGH (ref 70–99)
Glucose-Capillary: 124 mg/dL — ABNORMAL HIGH (ref 70–99)
Glucose-Capillary: 130 mg/dL — ABNORMAL HIGH (ref 70–99)
Glucose-Capillary: 140 mg/dL — ABNORMAL HIGH (ref 70–99)
Glucose-Capillary: 85 mg/dL (ref 70–99)
Glucose-Capillary: 87 mg/dL (ref 70–99)

## 2021-03-12 NOTE — Progress Notes (Signed)
Speech Language Pathology Daily Session Note  Patient Details  Name: Gregory [name] MRN: [id number] Date of Birth: 12-08-1920  Today's Date: 03/12/2021 SLP Individual Time: 1125-1200 SLP Individual Time Calculation (min): 35 min  Short Term Goals: Week 1: SLP Short Term Goal 1 (Week 1): Patient will demonstrate functional problem solving for basic, novel tasks (using a RW, etc)  with Min verbal cues. SLP Short Term Goal 2 (Week 1): Patient will recall the steps for basic, novel tasks (using a RW, etc)  with Mod verbal cues. SLP Short Term Goal 3 (Week 1): Patient will participate in RMT exercises to improve vocal and cough strength with Mod verbal cues for accuracy and a self-perceived effort level of 7/10.  Skilled Therapeutic Interventions: Pt seen in room for skilled SLP intervention. Educated pt and son on possibility of MBSS to determine current swallow function and safety with po intake. Pt identified safe/unsafe situations with min A with SLP demonstrating with rolling walker. He required min A to recall purpose for current medications. Short term memory recall task requiring pt to use repetition as a strategy completed with min mod A verbal cues for recall and processing verbal information. Pt left seated upright in chair with chair alarm set and call button within reach. Cont with therapy per plan of care.      Pain Pain Assessment Pain Scale: Faces Faces Pain Scale: No hurt  Therapy/Group: Individual Therapy  Gregory [name] 03/12/2021, 12:58 PM

## 2021-03-12 NOTE — Progress Notes (Signed)
PROGRESS NOTE   Subjective/Complaints: No problems overnight. Has a cough at times but utilizes suction. Urine has odor and is cloudy  ROS: Patient denies fever, rash, sore throat, blurred vision, dizziness, nausea, vomiting, diarrhea, cough, shortness of breath or chest pain, joint or back/neck pain, headache, or mood change.    Objective:   No results found. Recent Labs    03/10/21 0141 03/11/21 0537  WBC 8.1 10.6*  HGB 8.9* 10.2*  HCT 28.2* 31.0*  PLT 93* 101*   Recent Labs    03/10/21 0141 03/11/21 0537  NA 138 137  K 4.7 5.2*  CL 107 104  CO2 25 25  GLUCOSE 152* 104*  BUN 37* 35*  CREATININE 1.35* 1.40*  CALCIUM 7.9* 8.4*    Intake/Output Summary (Last 24 hours) at 03/12/2021 0630 Last data filed at 03/11/2021 1900 Gross per 24 hour  Intake --  Output 250 ml  Net -250 ml     Pressure Injury 03/03/21 Sacrum Mid (Active)  03/03/21 1108  Location: Sacrum  Location Orientation: Mid  Staging:   Wound Description (Comments):   Present on Admission:      Pressure Injury 03/03/21 Heel Right Stage 1 -  Intact skin with non-blanchable redness of a localized area usually over a bony prominence. (Active)  03/03/21 1110  Location: Heel  Location Orientation: Right  Staging: Stage 1 -  Intact skin with non-blanchable redness of a localized area usually over a bony prominence.  Wound Description (Comments):   Present on Admission: Yes     Pressure Injury 03/03/21 Heel Left Deep Tissue Pressure Injury - Purple or maroon localized area of discolored intact skin or blood-filled blister due to damage of underlying soft tissue from pressure and/or shear. (Active)  03/03/21 1114  Location: Heel  Location Orientation: Left  Staging: Deep Tissue Pressure Injury - Purple or maroon localized area of discolored intact skin or blood-filled blister due to damage of underlying soft tissue from pressure and/or shear.  Wound  Description (Comments):   Present on Admission: Yes     Pressure Injury 03/03/21 Foot Right;Lateral Stage 1 -  Intact skin with non-blanchable redness of a localized area usually over a bony prominence. (Active)  03/03/21 1115  Location: Foot  Location Orientation: Right;Lateral  Staging: Stage 1 -  Intact skin with non-blanchable redness of a localized area usually over a bony prominence.  Wound Description (Comments):   Present on Admission: Yes     Pressure Injury 03/03/21 Foot Left;Lateral Stage 1 -  Intact skin with non-blanchable redness of a localized area usually over a bony prominence. (Active)  03/03/21 1115  Location: Foot  Location Orientation: Left;Lateral  Staging: Stage 1 -  Intact skin with non-blanchable redness of a localized area usually over a bony prominence.  Wound Description (Comments):   Present on Admission: Yes    Physical Exam: Vital Signs Blood pressure 117/72, pulse 72, temperature 98.2 F (36.8 C), temperature source Oral, resp. rate 18, height 5\' 8"  (1.727 m), weight 73 kg, SpO2 94 %.  Constitutional: No distress . Vital signs reviewed. HEENT: NCAT, EOMI, oral membranes moist Neck: supple Cardiovascular: RRR without murmur. No JVD  Respiratory/Chest: CTA Bilaterally without wheezes or rales. Normal effort    GI/Abdomen: BS +, non-tender, non-distended Ext: no clubbing, cyanosis, or edema Psych: pleasant and cooperative  Psych: Pt's affect is appropriate. Pt is cooperative Skin: foot/heel wounds Neuro:  HOH but able comprehend when he's able to hear. No focal language issues. Mild dysarthria. Strength grossly 4/5 bilateral UE/LE's. No focal sensory abnl. No abnl tone.  Musculoskeletal: Full ROM, No pain with AROM or PROM in the neck, trunk, or extremities. Posture appropriate     Assessment/Plan: 1. Functional deficits which require 3+ hours per day of interdisciplinary therapy in a comprehensive inpatient rehab setting. Physiatrist is  providing close team supervision and 24 hour management of active medical problems listed below. Physiatrist and rehab team continue to assess barriers to discharge/monitor patient progress toward functional and medical goals  Care Tool:  Bathing  Bathing activity did not occur: Safety/medical concerns Body parts bathed by patient: Right arm, Left arm, Chest, Abdomen, Front perineal area, Right upper leg, Left upper leg, Face   Body parts bathed by helper: Buttocks, Right lower leg, Left lower leg     Bathing assist Assist Level: Maximal Assistance - Patient 24 - 49%     Upper Body Dressing/Undressing Upper body dressing   What is the patient wearing?: Pull over shirt    Upper body assist Assist Level: Maximal Assistance - Patient 25 - 49%    Lower Body Dressing/Undressing Lower body dressing      What is the patient wearing?: Pants, Incontinence brief     Lower body assist Assist for lower body dressing: Maximal Assistance - Patient 25 - 49%     Toileting Toileting    Toileting assist Assist for toileting: Total Assistance - Patient < 25%     Transfers Chair/bed transfer  Transfers assist     Chair/bed transfer assist level: Moderate Assistance - Patient 50 - 74%     Locomotion Ambulation   Ambulation assist   Ambulation activity did not occur: Safety/medical concerns (pt uses QC at baseline but requires use of RW for safe ambulation)          Walk 10 feet activity   Assist  Walk 10 feet activity did not occur: Safety/medical concerns        Walk 50 feet activity   Assist Walk 50 feet with 2 turns activity did not occur: Safety/medical concerns         Walk 150 feet activity   Assist Walk 150 feet activity did not occur: Safety/medical concerns         Walk 10 feet on uneven surface  activity   Assist Walk 10 feet on uneven surfaces activity did not occur: Safety/medical concerns         Wheelchair     Assist Is the  patient using a wheelchair?: Yes Type of Wheelchair: Manual    Wheelchair assist level: Dependent - Patient 0%      Wheelchair 50 feet with 2 turns activity    Assist        Assist Level: Dependent - Patient 0%   Wheelchair 150 feet activity     Assist      Assist Level: Dependent - Patient 0%   Blood pressure 117/72, pulse 72, temperature 98.2 F (36.8 C), temperature source Oral, resp. rate 18, height 5\' 8"  (1.727 m), weight 73 kg, SpO2 94 %.  Medical Problem List and Plan: 1. Functional deficits secondary to debility             -  patient may shower but PEG must be covered.              -ELOS/Goals: 2-3 weeks S-MinA             -Continue CIR therapies including PT, OT, and SLP   2.  Antithrombotics: -DVT/anticoagulation:  Mechanical: Sequential compression devices, below knee Bilateral lower extremities             -antiplatelet therapy: N/A 3. Pain Management: Tylenol 4. Mood:  LCSW to follow              -antipsychotic agents: N/A 5. Neuropsych: This patient is capable of making decisions on his own behalf. 6. Skin/Wound Care: pressure relief, protection to feet/heels 7. Fluids/Electrolytes/Nutrition: continue TF  -hyperkalemia: lokelma 5mg  daily x 2 doses, recheck labs Monday  -can reduce CBG's to q6 8. A flutter s/p TAVR 2020: plan is to hold eliquis for now and resume in the next few weeks potentially --HR controlled 1/29 9. Severe dysphagia w/aspiration: NPO with tube feeds for nutritonal support.  10. GIB: Monitor for signs of bleeding. Continue Protonix and Carafate. Holding Eliquis.  -1/28 hgb up to 10.6 --> recheck monday. 11. CKD 3a: BUN/SCr 124/2.07             --improved to 35/1.4 on 1/28  -monitor output, labs Monday  12. Constipation: Continue Senna daily.  13. HLD: continue atorvastatin 40mg  daily.  14. Hypothyroidism: continue Synthroid.     LOS: 2 days A FACE TO FACE EVALUATION WAS PERFORMED  Meredith Staggers 03/12/2021, 9:09 AM

## 2021-03-12 NOTE — Progress Notes (Signed)
Pt's position rotated throughout the night to relieve sacral pressure. HOB elevated 30 degrees. PT continues to have a productive cough and is using suction at the bedside to clear secretions.  Pt's called out for the urinal at least 5 times with volumes 150-200 ml. Pt denies any pain with urination. Urine noted with foul smell with cloudy appearance.   Pt CBG checked every 4 hours. CBG went from 146 at 2000, 124 at 0000 and 87 at [place]. Pt tolerated Bolus tube feeding at start of shift. Orders to report CBG less than 90 or greater than 160. Charge nurse Roselyn Reef notified . Per her order Dr. Naaman Plummer will be notified in the morning and to not call the MD. PT does not have any dizziness, shaking, or clammy skin. RN will continue to assess and monitor.

## 2021-03-12 NOTE — Progress Notes (Signed)
Physical Therapy Session Note  Patient Details  Name: Gregory [name] MRN: [id number] Date of Birth: 10-Oct-1920  Today's Date: 03/12/2021 PT Individual Time: 1030-1100 PT Individual Time Calculation (min): 30 min   Short Term Goals: Week 1:  PT Short Term Goal 1 (Week 1): Pt will perform supine<>sit with min assist PT Short Term Goal 2 (Week 1): Pt will perform sit<>stand transfers using LRAD with min assist PT Short Term Goal 3 (Week 1): Pt will perform stand pivot transfers using LRAD with min assist PT Short Term Goal 4 (Week 1): Pt will ambulate at least 166ft using LRAD with min assist PT Short Term Goal 5 (Week 1): Pt will navigate 4 stairs using HRs with min assist  Skilled Therapeutic Interventions/Progress Updates:    Pt received standing at toilet with NT after just completing toileting and pericare. Pt is min A for standing balance while assisted with clothing management. Ambulatory transfer to bed with RW and min A for balance. Assisted pt with doffing soiled shirt and is min A to don new scrub top. Sit to stand with mod A to RW during session with cues for anterior weight shift and safe UE placement. Standing BLE strengthening therex 2 x 10 reps: marches, squats with RW and min A for balance. Ambulatory transfer to recliner with RW and min A for balance. Pt left seated in recliner in room with needs in reach, chair alarm in place, son present.  Therapy Documentation Precautions:  Precautions Precautions: Fall Precaution Comments: NPO with PEG tube, posterior lean Restrictions Weight Bearing Restrictions: No      Therapy/Group: Individual Therapy   Excell Seltzer, PT, DPT, CSRS  03/12/2021, 12:26 PM

## 2021-03-13 LAB — COMPREHENSIVE METABOLIC PANEL
ALT: 30 U/L (ref 0–44)
AST: 35 U/L (ref 15–41)
Albumin: 2.4 g/dL — ABNORMAL LOW (ref 3.5–5.0)
Alkaline Phosphatase: 55 U/L (ref 38–126)
Anion gap: 6 (ref 5–15)
BUN: 34 mg/dL — ABNORMAL HIGH (ref 8–23)
CO2: 27 mmol/L (ref 22–32)
Calcium: 8.3 mg/dL — ABNORMAL LOW (ref 8.9–10.3)
Chloride: 102 mmol/L (ref 98–111)
Creatinine, Ser: 1.42 mg/dL — ABNORMAL HIGH (ref 0.61–1.24)
GFR, Estimated: 45 mL/min — ABNORMAL LOW (ref >60)
Glucose, Bld: 92 mg/dL (ref 70–99)
Potassium: 5.2 mmol/L — ABNORMAL HIGH (ref 3.5–5.1)
Sodium: 135 mmol/L (ref 135–145)
Total Bilirubin: 0.7 mg/dL (ref 0.3–1.2)
Total Protein: 5.5 g/dL — ABNORMAL LOW (ref 6.5–8.1)

## 2021-03-13 LAB — CBC WITH DIFFERENTIAL/PLATELET
Abs Immature Granulocytes: 0.03 10*3/uL (ref 0.00–0.07)
Basophils Absolute: 0 10*3/uL (ref 0.0–0.1)
Basophils Relative: 0 %
Eosinophils Absolute: 0.3 10*3/uL (ref 0.0–0.5)
Eosinophils Relative: 3 %
HCT: 28 % — ABNORMAL LOW (ref 39.0–52.0)
Hemoglobin: 8.9 g/dL — ABNORMAL LOW (ref 13.0–17.0)
Immature Granulocytes: 0 %
Lymphocytes Relative: 14 %
Lymphs Abs: 1.4 10*3/uL (ref 0.7–4.0)
MCH: 30.9 pg (ref 26.0–34.0)
MCHC: 31.8 g/dL (ref 30.0–36.0)
MCV: 97.2 fL (ref 80.0–100.0)
Monocytes Absolute: 1.3 10*3/uL — ABNORMAL HIGH (ref 0.1–1.0)
Monocytes Relative: 13 %
Neutro Abs: 7 10*3/uL (ref 1.7–7.7)
Neutrophils Relative %: 70 %
Platelets: 123 10*3/uL — ABNORMAL LOW (ref 150–400)
RBC: 2.88 MIL/uL — ABNORMAL LOW (ref 4.22–5.81)
RDW: 17.2 % — ABNORMAL HIGH (ref 11.5–15.5)
WBC: 10 10*3/uL (ref 4.0–10.5)
nRBC: 0 % (ref 0.0–0.2)

## 2021-03-13 LAB — GLUCOSE, CAPILLARY
Glucose-Capillary: 84 mg/dL (ref 70–99)
Glucose-Capillary: 117 mg/dL — ABNORMAL HIGH (ref 70–99)
Glucose-Capillary: 122 mg/dL — ABNORMAL HIGH (ref 70–99)

## 2021-03-13 MED ORDER — SODIUM ZIRCONIUM CYCLOSILICATE 10 G PO PACK
10.0000 g | PACK | Freq: Once | ORAL | Status: AC
  Administered 2021-03-13: 10 g
  Filled 2021-03-13: qty 1

## 2021-03-13 MED ORDER — NEPRO/CARBSTEADY PO LIQD
330.0000 mL | Freq: Three times a day (TID) | ORAL | Status: DC
  Administered 2021-03-13 – 2021-03-22 (×27): 330 mL

## 2021-03-13 MED ORDER — K PHOS MONO-SOD PHOS DI & MONO 155-852-130 MG PO TABS
250.0000 mg | ORAL_TABLET | Freq: Every day | ORAL | Status: DC
  Administered 2021-03-13 – 2021-03-31 (×19): 250 mg
  Filled 2021-03-13 (×20): qty 1

## 2021-03-13 MED ORDER — FREE WATER
200.0000 mL | Freq: Four times a day (QID) | Status: DC
  Administered 2021-03-13 – 2021-03-22 (×36): 200 mL

## 2021-03-13 NOTE — Progress Notes (Signed)
Inpatient Rehabilitation Admission Medication Review by a Pharmacist  A complete drug regimen review was completed for this patient to identify any potential clinically significant medication issues.  High Risk Drug Classes Is patient taking? Indication by Medication  Antipsychotic Yes Compazine- N/V  Anticoagulant No   Antibiotic No   Opioid No   Antiplatelet No   Hypoglycemics/insulin No   Vasoactive Medication No   Chemotherapy No   Other Yes Lipitor- HLD Protonix- GERD Trazodone- sleep     Type of Medication Issue Identified Description of Issue Recommendation(s)  Drug Interaction(s) (clinically significant)     Duplicate Therapy     Allergy     No Medication Administration End Date     Incorrect Dose     Additional Drug Therapy Needed  Patient is on apixaban prior to admission for aflutter which has been held for GIB. Per discharge summary on 03/10/21 family has decided to hold apixaban with plan to resume in coming weeks.  Will continue to monitor plans to resume apixaban  Significant med changes from prior encounter (inform family/care partners about these prior to discharge).    Other       Clinically significant medication issues were identified that warrant physician communication and completion of prescribed/recommended actions by midnight of the next day:  No  Time spent performing this drug regimen review (minutes):  30   Ariez Neilan BS, PharmD, BCPS Clinical Pharmacist 03/13/2021 11:17 AM

## 2021-03-13 NOTE — Care Management (Signed)
[facility] Individual Statement of Services  Patient Name:  Gregory [name]  Date:  03/13/2021  Welcome to the [facility].  Our goal is to provide you with an individualized program based on your diagnosis and situation, designed to meet your specific needs.  With this comprehensive rehabilitation program, you will be expected to participate in at least 3 hours of rehabilitation therapies Monday-Friday, with modified therapy programming on the weekends.  Your rehabilitation program will include the following services:  Physical Therapy (PT), Occupational Therapy (OT), Speech Therapy (ST), 24 hour per day rehabilitation nursing, Therapeutic Recreaction (TR), Psychology, Neuropsychology, Care Coordinator, Rehabilitation Medicine, [place], and Other  Weekly team conferences will be held on Tuesdays to discuss your progress.  Your Inpatient Rehabilitation Care Coordinator will talk with you frequently to get your input and to update you on team discussions.  Team conferences with you and your family in attendance may also be held.  Expected length of stay: 2-2.5 weeks    Overall anticipated outcome: Supervision  Depending on your progress and recovery, your program may change. Your Inpatient Rehabilitation Care Coordinator will coordinate services and will keep you informed of any changes. Your Inpatient Rehabilitation Care Coordinator's name and contact numbers are listed  below.  The following services may also be recommended but are not provided by the [facility] will be made to provide these services after discharge if needed.  Arrangements include referral to agencies that provide these services.  Your insurance has been verified to be:  Medicare A/B  Your primary  doctor is:  Wylene Simmer  Pertinent information will be shared with your doctor and your insurance company.  Inpatient Rehabilitation Care Coordinator:  Cathleen Corti [phone] or [phone]  Information discussed with and copy given to patient by: Rana Snare, 03/13/2021, 9:09 AM

## 2021-03-13 NOTE — Progress Notes (Signed)
Speciality syringes obtained for PEG tube placed by [facility]. Approx. 20 syringes obtained from materials and box placed in patient's room. Will pass on to next shift importance of syringes and how they are special ordered via fedex.

## 2021-03-13 NOTE — Progress Notes (Signed)
Inpatient Rehabilitation  Patient information reviewed and entered into eRehab system by Reannon Candella M. Keghan Mcfarren, M.A., CCC/SLP, PPS Coordinator.  Information including medical coding, functional ability and quality indicators will be reviewed and updated through discharge.    

## 2021-03-13 NOTE — IPOC Note (Signed)
Overall Plan of Care [facility]) Patient Details Name: Gregory [name] MRN: [id number] DOB: 1920/04/13  Admitting Diagnosis: Debility  Hospital Problems: Principal Problem:   Debility     Functional Problem List: Nursing Bowel, Medication Management, Safety, Skin Integrity, Nutrition, Endurance  PT Balance, Perception, Behavior, Safety, Edema, Sensory, Endurance, Skin Integrity, Motor, Nutrition, Pain  OT Balance, Cognition, Vision, Endurance, Motor, Safety  SLP Cognition, Nutrition  TR         Basic ADLs: OT Eating, Grooming, Bathing, Dressing, Toileting     Advanced  ADLs: OT Simple Meal Preparation     Transfers: PT Bed Mobility, Bed to Chair, Car, Manufacturing systems engineer, Metallurgist: PT Ambulation, Stairs     Additional Impairments: OT None  SLP Swallowing   Problem Solving, Awareness  TR      Anticipated Outcomes Item Anticipated Outcome  Self Feeding No goal- NPO  Swallowing  Min A   Basic self-care  Supervision  Toileting  Supervision   Bathroom Transfers Supervision  Bowel/Bladder  manage bowel with mod I  Transfers  supervision using LRAD  Locomotion  supervision using LRAD  Communication     Cognition  Supervision  Pain  N/A  Safety/Judgment  maintain safety w cues   Therapy Plan: PT Intensity: Minimum of 1-2 x/day ,45 to 90 minutes PT Frequency: 5 out of 7 days PT Duration Estimated Length of Stay: ~2-2.5 weeks OT Intensity: Minimum of 1-2 x/day, 45 to 90 minutes OT Frequency: 5 out of 7 days OT Duration/Estimated Length of Stay: 2 to 2.5 weeks SLP Intensity: Minumum of 1-2 x/day, 30 to 90 minutes SLP Frequency: 3 to 5 out of 7 days SLP Duration/Estimated Length of Stay: 2-2.5 weeks   Due to the current state of emergency, patients may not be receiving their 3-hours of Medicare-mandated therapy.   Team Interventions: Nursing Interventions Disease Management/Prevention, Medication Management, Discharge Planning, Pain  Management, Bowel Management, Patient/Family Education, Skin Care/Wound Management  PT interventions Ambulation/gait training, Community reintegration, DME/adaptive equipment instruction, Neuromuscular re-education, Psychosocial support, Stair training, UE/LE Strength taining/ROM, Training and development officer, Discharge planning, Functional electrical stimulation, Pain management, Skin care/wound management, UE/LE Coordination activities, Cognitive remediation/compensation, Therapeutic Activities, Disease management/prevention, Functional mobility training, Patient/family education, Splinting/orthotics, Therapeutic Exercise, Visual/perceptual remediation/compensation  OT Interventions Disease mangement/prevention, Training and development officer, Self Care/advanced ADL retraining, Therapeutic Exercise, UE/LE Strength taining/ROM, Pain management, DME/adaptive equipment instruction, Cognitive remediation/compensation, Patient/family education, UE/LE Coordination activities, Community reintegration, Discharge planning, Functional mobility training, Psychosocial support, Therapeutic Activities, Visual/perceptual remediation/compensation  SLP Interventions Cognitive remediation/compensation, English as a second language teacher, Environmental controls, Therapeutic Activities, Functional tasks, Patient/family education, Dysphagia/aspiration precaution training  TR Interventions    SW/CM Interventions Psychosocial Support, Discharge Planning, Patient/Family Education   Barriers to Discharge MD  Medical stability, Home enviroment access/loayout, Incontinence, Wound care, Weight, and NPO  Nursing Decreased caregiver support, Home environment access/layout, Wound Care, Nutrition means 2 level main B+B w son 2ste, right rail; has [place]  PT Inaccessible home environment, Home environment access/layout, Nutrition means    OT Other (comments) n/a- pt appears to have 24/7 support at home and can reside on main level if needed  SLP      SW  Decreased caregiver support, Lack of/limited family support     Team Discharge Planning: Destination: PT-Home ,OT- Home , SLP-Home Projected Follow-up: PT-Home health PT, 24 hour supervision/assistance, OT-  Home health OT, SLP-Home Health SLP, 24 hour supervision/assistance Projected Equipment Needs: PT-To be determined, OT- To be determined, SLP-To be determined Equipment Details: PT-has hospital  bed, RW, and narrow based quad cane, OT-  Patient/family involved in discharge planning: PT- Patient, Family Midwife,  OT-Patient, Family member/caregiver, SLP-Patient, Family member/caregiver  MD ELOS: 2-2.5 weeks Medical Rehab Prognosis:  Good Assessment: Pt is a 88 yr old male with debility due to GI bleed and Afib/flutter- Goals supervision    See Team Conference Notes for weekly updates to the plan of care

## 2021-03-13 NOTE — Progress Notes (Signed)
PROGRESS NOTE   Subjective/Complaints:  Pt reports had 2 fully black stools- - Therapy went OK-  (-) pain.   Is NPO.  ROS:  Pt denies SOB, abd pain, CP, N/V/C/D, and vision changes  Objective:   No results found. Recent Labs    03/11/21 0537 03/13/21 0611  WBC 10.6* 10.0  HGB 10.2* 8.9*  HCT 31.0* 28.0*  PLT 101* 123*   Recent Labs    03/11/21 0537 03/13/21 0611  NA 137 135  K 5.2* 5.2*  CL 104 102  CO2 25 27  GLUCOSE 104* 92  BUN 35* 34*  CREATININE 1.40* 1.42*  CALCIUM 8.4* 8.3*    Intake/Output Summary (Last 24 hours) at 03/13/2021 1823 Last data filed at 03/13/2021 1022 Gross per 24 hour  Intake --  Output 1600 ml  Net -1600 ml     Pressure Injury 03/03/21 Sacrum Mid (Active)  03/03/21 1108  Location: Sacrum  Location Orientation: Mid  Staging:   Wound Description (Comments):   Present on Admission:      Pressure Injury 03/03/21 Heel Left Deep Tissue Pressure Injury - Purple or maroon localized area of discolored intact skin or blood-filled blister due to damage of underlying soft tissue from pressure and/or shear. (Active)  03/03/21 1114  Location: Heel  Location Orientation: Left  Staging: Deep Tissue Pressure Injury - Purple or maroon localized area of discolored intact skin or blood-filled blister due to damage of underlying soft tissue from pressure and/or shear.  Wound Description (Comments):   Present on Admission: Yes    Physical Exam: Vital Signs Blood pressure (!) 114/48, pulse 70, temperature 98.3 F (36.8 C), temperature source Oral, resp. rate 17, height 5\' 8"  (1.727 m), weight 107 kg, SpO2 98 %.    General: awake, alert, appropriate, sleepy; supine in bed; NAD HENT: conjugate gaze; oropharynx moist CV: regular rate; no JVD Pulmonary: CTA B/L; no W/R/R- good air movement GI: soft, NT, ND, (+)BS; (+) PEG Psychiatric: appropriate; flat; sleepy Neurological: alert; very  HOH  Ext: no clubbing, cyanosis, or edema Psych: pleasant and cooperative  Psych: Pt's affect is appropriate. Pt is cooperative Skin: foot/heel wounds Neuro:  HOH but able comprehend when he's able to hear. No focal language issues. Mild dysarthria. Strength grossly 4/5 bilateral UE/LE's. No focal sensory abnl. No abnl tone.  Musculoskeletal: Full ROM, No pain with AROM or PROM in the neck, trunk, or extremities. Posture appropriate     Assessment/Plan: 1. Functional deficits which require 3+ hours per day of interdisciplinary therapy in a comprehensive inpatient rehab setting. Physiatrist is providing close team supervision and 24 hour management of active medical problems listed below. Physiatrist and rehab team continue to assess barriers to discharge/monitor patient progress toward functional and medical goals  Care Tool:  Bathing    Body parts bathed by patient: Right arm, Left arm, Chest, Abdomen, Front perineal area, Right upper leg, Left upper leg, Face   Body parts bathed by helper: Buttocks, Right lower leg, Left lower leg     Bathing assist Assist Level: Maximal Assistance - Patient 24 - 49%     Upper Body Dressing/Undressing Upper body dressing   What is the  patient wearing?: Hospital gown only    Upper body assist Assist Level: Moderate Assistance - Patient 50 - 74%    Lower Body Dressing/Undressing Lower body dressing      What is the patient wearing?: Incontinence brief, Hospital gown only     Lower body assist Assist for lower body dressing: Moderate Assistance - Patient 50 - 74%     Toileting Toileting    Toileting assist Assist for toileting: Moderate Assistance - Patient 50 - 74%     Transfers Chair/bed transfer  Transfers assist     Chair/bed transfer assist level: Minimal Assistance - Patient > 75%     Locomotion Ambulation   Ambulation assist   Ambulation activity did not occur: Safety/medical concerns (pt uses QC at baseline but  requires use of RW for safe ambulation)  Assist level: Contact Guard/Touching assist Assistive device: Walker-rolling Max distance: 150'   Walk 10 feet activity   Assist  Walk 10 feet activity did not occur: Safety/medical concerns  Assist level: Contact Guard/Touching assist Assistive device: Walker-rolling   Walk 50 feet activity   Assist Walk 50 feet with 2 turns activity did not occur: Safety/medical concerns  Assist level: Contact Guard/Touching assist Assistive device: Walker-rolling    Walk 150 feet activity   Assist Walk 150 feet activity did not occur: Safety/medical concerns  Assist level: Contact Guard/Touching assist Assistive device: Walker-rolling    Walk 10 feet on uneven surface  activity   Assist Walk 10 feet on uneven surfaces activity did not occur: Safety/medical concerns         Wheelchair     Assist Is the patient using a wheelchair?: Yes Type of Wheelchair: Manual    Wheelchair assist level: Dependent - Patient 0%      Wheelchair 50 feet with 2 turns activity    Assist        Assist Level: Dependent - Patient 0%   Wheelchair 150 feet activity     Assist      Assist Level: Dependent - Patient 0%   Blood pressure (!) 114/48, pulse 70, temperature 98.3 F (36.8 C), temperature source Oral, resp. rate 17, height 5\' 8"  (1.727 m), weight 107 kg, SpO2 98 %.  Medical Problem List and Plan: 1. Functional deficits secondary to debility             -patient may shower but PEG must be covered.              -ELOS/Goals: 2-3 weeks S-MinA             -Continue CIR- PT, OT and SLP 2.  Antithrombotics: -DVT/anticoagulation:  Mechanical: Sequential compression devices, below knee Bilateral lower extremities 1/30- pt having GI bleed- likely, still with drop in Hb- cannot restart Eliquis at this time             -antiplatelet therapy: N/A 3. Pain Management: Tylenol 4. Mood:  LCSW to follow              -antipsychotic  agents: N/A 5. Neuropsych: This patient is capable of making decisions on his own behalf. 6. Skin/Wound Care: pressure relief, protection to feet/heels 7. Fluids/Electrolytes/Nutrition: continue TF  -hyperkalemia: lokelma 5mg  daily x 2 doses, recheck labs Monday  -can reduce CBG's to q6  1/30- K+ still 5.2- will  8. A flutter s/p TAVR 2020: plan is to hold eliquis for now and resume in the next few weeks potentially --HR controlled 1/29 1/30- hold eliquis- likely still bleeding-  will call Cards in AM to see their plan.  9. Severe dysphagia w/aspiration: NPO with tube feeds for nutritonal support.  10. GIB: Monitor for signs of bleeding. Continue Protonix and Carafate. Holding Eliquis.  -1/28 hgb up to 10.6 --> recheck monday. 1/30- Hb 8.9- was 10.2- but previous was 8.9 so likely not bleeding severely, but will recheck labs in AM 11. CKD 3a: BUN/SCr 124/2.07             --improved to 35/1.4 on 1/28  1/30- Cr 1.42 and BUN 34- overall stable- con't to monitor  -monitor output, labs Monday  12. Constipation: Continue Senna daily.  13. HLD: continue atorvastatin 40mg  daily.  14. Hypothyroidism: continue Synthroid.  15. Hyperkalemia- likely due to TfF'- has PEG.  16. NPO- on TF's    I spent a total of  41  minutes on total care today- >50% coordination of care- due to possible GI bleed, discuss with PA about eiliquis   LOS: 3 days A FACE TO FACE EVALUATION WAS PERFORMED  Gerrard Crystal 03/13/2021, 6:23 PM

## 2021-03-13 NOTE — Progress Notes (Signed)
Patient with persistently elevated K+ despite 2 doses of lokelma this weekend. Will repeat with Lokelma 10 mg per tube today and recheck labs in am. Hyperkalemia likely due to CKD/tube feeds-->reached out to RD who recommended change to Nephro which is low in phos/K+. Will add K phos for supplement.

## 2021-03-13 NOTE — Progress Notes (Signed)
CBG this morning was 84. Nurse notified PA, per order stating to notify MD for CBG <90. Pt denies any symptoms. Will continue to monitor.

## 2021-03-13 NOTE — Progress Notes (Signed)
Physical Therapy Session Note  Patient Details  Name: Gregory [name] MRN: [id number] Date of Birth: October 30, 1920  Today's Date: 03/13/2021 PT Individual Time: 0800-0900; 6438-3818 PT Individual Time Calculation (min): 60 min and 25 min  Short Term Goals: Week 1:  PT Short Term Goal 1 (Week 1): Pt will perform supine<>sit with min assist PT Short Term Goal 2 (Week 1): Pt will perform sit<>stand transfers using LRAD with min assist PT Short Term Goal 3 (Week 1): Pt will perform stand pivot transfers using LRAD with min assist PT Short Term Goal 4 (Week 1): Pt will ambulate at least 173ft using LRAD with min assist PT Short Term Goal 5 (Week 1): Pt will navigate 4 stairs using HRs with min assist  Skilled Therapeutic Interventions/Progress Updates:    Session 1: Pt received supine in bed asleep, agreeable to PT session. No complaints of pain, just reports feeling "weak". Supine to sit with min A for trunk elevation, increased time needed, use of bedrail and HOB elevated. Assisted pt with donning pants while seated EOB. Sit to stand with min to mod A to RW during session. Ambulation x 70 ft, x 150 ft with RW and CGA for balance, flexed neck and trunk during gait, shuffling gait pattern with slow cadence. Ascend/descend 8 x 3" stairs with 2 handrails and CGA, alternating gait pattern. Progressing to ascend/descend 4 x 6" stairs with 2 handrails and min to mod A needed for balance due to knee flexion in stance and significant LE weakness noted. Unclear what pt's home setup is in regard to stairs entering the home, pt's son to provide pictures. Per pt and son he does stay on main level once inside the home. Pt left seated in recliner in room with needs in reach, son present.  Session 2: Pt received seated in recliner in room, agreeable to PT session. No complaints of pain, reports feeling fatigued. Pt reports urge to use the bathroom. Sit to stand with mod A to RW. Ambulatory transfer into bathroom with  RW and min A for balance. Pt requests to attempt urinating while standing, does not follow cues for safe RW management to straddle toilet with RW for balance and pt leaves RW behind him and turns towards toilet with only one grab bar for support. Pt is max A for clothing management due to impaired balance in standing without UE support. Pt then requests urinal even though standing over toilet. Pt able to hold urinal and void continently but exhibits increase in knee flexion in standing with onset of fatigue, minimal ability to correct with cueing. Pt able to ambulate back to recliner in room with RW and min A for balance with knees remaining flexed. Education with pt that for energy conservation and safety would be better for him to sit on toilet to urinate, pt in agreement. Ambulation recliner to/from door of hospital room with RW and min A. Pt left seated in recliner in room with needs in reach at end of session, son present.  Therapy Documentation Precautions:  Precautions Precautions: Fall Precaution Comments: NPO with PEG tube, posterior lean Restrictions Weight Bearing Restrictions: No     Therapy/Group: Individual Therapy   Excell Seltzer, PT, DPT, CSRS  03/13/2021, 9:15 AM

## 2021-03-13 NOTE — Evaluation (Signed)
Speech Language Pathology Assessment and Plan  Patient Details  Name: Gregory [name] MRN: [id number] Date of Birth: 1921-02-04  SLP Diagnosis: Dysphagia  Rehab Potential: Good ELOS: 2-2.5 weeks    Today's Date: 03/13/2021 SLP Individual Time: 0923-1002 SLP Individual Time Calculation (min): 32 min   Hospital Problem: Principal Problem:   Debility  Past Medical History:  Past Medical History:  Diagnosis Date   Atrial flutter ([place])    s/p ablation   Cancer ([place])    skin cancer   Cataracts, bilateral    Hypertension    Neuromuscular disorder ([place])    Pacemaker 2010   Thyroid disease    Past Surgical History:  Past Surgical History:  Procedure Laterality Date   APPENDECTOMY     COLONOSCOPY     EYE SURGERY Bilateral    cataract surgery   HERNIA REPAIR Right    INGUINAL HERNIA REPAIR Left 10/12/2013   Procedure: LEFT  INGUINAL HERNIA REPAIR;  Surgeon: Odis Hollingshead, MD;  Location: [facility];  Service: General;  Laterality: Left;   INSERTION OF MESH Left 10/12/2013   Procedure: INSERTION OF MESH;  Surgeon: Odis Hollingshead, MD;  Location: [facility];  Service: General;  Laterality: Left;   PEG TUBE PLACEMENT Left     Assessment / Plan / Recommendation Clinical Impression  Patient is a 88 year old male with history of A Flutter s/p ablation w/PPM, HTN, neuropathy, lung nodule, TVAR, HOH, dysphagia s/p G tube (placed in [place] 02/10/21) who was admitted on 03/02/21 with hypotension, dizziness, near syncope and hgb 5.9.  He was treated with fluid bolus and 2 units PRBC  with improvement of symptoms. FBOT positive with reports of dark stools as well as reports of Hgb of 7.0 and trauma to GI tract due to NGT per family.  Dr. Silverio Decamp recommended conservative management of NPO, IV PPI and transfusion as needed as heme positive stools felt to be related to NGT mucosal trauma and to hold eliquis. Palliative care consulted to discuss [place]  and patient/family elected on full code/full  scope of practice. PEG tube noted to be loose and T-fastener removed  by radiology today. Hgb stabilized and tube feeds resumed. Patient noted to be deconditioned with posterior lean on standing, unsteady gait and difficulty completing ADLs. CIR recommended due to functional decline. Patient admitted 03/10/21.  Pt participated in BSE. Pt's son was present and provided detailed medical history along with recommendation for NPO due to medical team noting "aspiration." Pt's son reported no h/o pf pneumonia. SLP provided education pertaining to most recent MBS listed on "care everywhere" indicting no aspiration but penetration on thin liquids. Pt and pt's son support a baseline cough with/without PO intake. Pt noted pt coughing without PO intake x2 during the session. SLP provided set up for oral care with suction toothbrush, pt completed with min for thoroughness. Pt demonstrated only deficits of generalized weakness during oral motor exam. Pt consumed thin liquids via ice chips, TSP and cup sips, in which pt delayed possible swallow initiation delay and multiple swallows. Pt demonstrated x3 delayed coughs with consuming thin liquids and the percentage of s/s aspiration increase along with the increase in liquid amount. Pt coughed up yellow mucus following a thin liquid via cup trial and x1 belch following a liquid wash of thin liquid. Pt demonstrated reduced s/s aspiration during trials with thicker textures, (nectar thick liquids, puree textures, dys 2 textures and dys 3 textures) but did require multiple swallows (3-4) and reported increase  difficulty "getting it down", likely pharyngeal residue along with delay in swallow initiation. SLP educated pt on plan for MBS tomorrow to best determine swallow function and least restrictive diet. All questions answered to satisfaction.   Skilled Therapeutic Interventions          Pt participated in BSE. SLP ordered instrumental swallow assessment tomorrow to determine  swallow function and least restrictive diet. Recommend to continue ST services.    SLP Assessment  Patient will need skilled [facility] during CIR admission    Recommendations  SLP Diet Recommendations: NPO;Alternative means - long-term Medication Administration: Via alternative means Oral Care Recommendations: Oral care QID Patient destination: Home Follow up Recommendations: Home Health SLP;24 hour supervision/assistance Equipment Recommended: To be determined    SLP Frequency 3 to 5 out of 7 days   SLP Duration  SLP Intensity  SLP Treatment/Interventions 2-2.5 weeks  Minumum of 1-2 x/day, 30 to 90 minutes  Cognitive remediation/compensation;Cueing hierarchy;Environmental controls;Therapeutic Activities;Functional tasks;Patient/family education;Dysphagia/aspiration precaution training    Pain Pain Assessment Pain Score: 0-No pain  Prior Functioning Type of Home: House  Lives With: Son Available Help at Discharge: Family;Available 24 hours/day;Personal care attendant Vocation: Retired   Secretary/administrator Diet Prior to this Study: NPO Behavior/Cognition: Alert;Cooperative;Pleasant mood Oral Cavity - Dentition: Adequate natural dentition Self-Feeding Abilities: Able to feed self Patient Positioning: Upright in chair/Tumbleform Baseline Vocal Quality: Normal;Low vocal intensity Volitional Cough: Strong Volitional Swallow: Able to elicit  Oral Care Assessment Does patient have any of the following "high(er) risk" factors?: Diet - patient on tube feedings Patient is HIGH RISK: Non-ventilated: Order set for Adult Oral Care Protocol initiated - "High Risk Patients - Non-Ventilated" option selected  (see row information) Ice Chips Ice chips: Impaired Presentation: Spoon Oral Phase Functional Implications: Prolonged oral transit Pharyngeal Phase Impairments: Suspected delayed Swallow;Multiple swallows;Cough - Delayed Other  Comments: coughing with and with PO intake Thin Liquid Thin Liquid: Impaired Presentation: Spoon;Self Fed;Cup Oral Phase Functional Implications: Prolonged oral transit Pharyngeal  Phase Impairments: Suspected delayed Swallow;Multiple swallows;Cough - Delayed Other Comments: coughing increased with increase in amount of liquids Nectar Thick Nectar Thick Liquid: Impaired Presentation: Self Fed;Spoon;Cup Oral phase functional implications: Prolonged oral transit Pharyngeal Phase Impairments: Suspected delayed Swallow;Multiple swallows Honey Thick Honey Thick Liquid: Not tested Puree Puree: Impaired Presentation: Spoon Oral Phase Functional Implications: Prolonged oral transit Pharyngeal Phase Impairments: Suspected delayed Swallow;Multiple swallows Solid Solid: Impaired Presentation: Self Fed;Spoon Oral Phase Functional Implications: Prolonged oral transit Pharyngeal Phase Impairments: Multiple swallows;Suspected delayed Swallow Other Comments: number of solids incraese along with the thickness of the texture BSE Assessment Suspected Esophageal Findings Suspected Esophageal Findings: Belching (x1 after a liquid wash following puree) Risk for Aspiration Impact on safety and function: Moderate aspiration risk Other Related Risk Factors: History of dysphagia;Deconditioning;Lethargy;Decreased respiratory status  Short Term Goals: Week 1: SLP Short Term Goal 1 (Week 1): Patient will demonstrate functional problem solving for basic, novel tasks (using a RW, etc)  with Min verbal cues. SLP Short Term Goal 2 (Week 1): Patient will recall the steps for basic, novel tasks (using a RW, etc)  with Mod verbal cues. SLP Short Term Goal 3 (Week 1): Patient will participate in RMT exercises to improve vocal and cough strength with Mod verbal cues for accuracy and a self-perceived effort level of 7/10. SLP Short Term Goal 4 (Week 1): Pt will participate in instrumental swallow assessment to  determine swallow function, least restrictive diet and risk of aspiration.  Refer to Care  Plan for Long Term Goals  Recommendations for other services: None   Discharge Criteria: Patient will be discharged from SLP if patient refuses treatment 3 consecutive times without medical reason, if treatment goals not met, if there is a change in medical status, if patient makes no progress towards goals or if patient is discharged from hospital.  The above assessment, treatment plan, treatment alternatives and goals were discussed and mutually agreed upon: by patient and by family  Gusta Marksberry  [facility] 03/13/2021, 10:18 AM

## 2021-03-13 NOTE — Progress Notes (Addendum)
Initial Nutrition Assessment  DOCUMENTATION CODES:   Not applicable  INTERVENTION:  Provide bolus tube feeds via PEG using Nepro formula:  Start bolus feeds at starting volume of 120 ml (half carton/ARC) and increase by 120 ml at each feeding until goal volume of 330 ml (11 ounces) given TID is met.  Free water flushes of 200 ml every 6 hours per tube.   Tube feeding regimen to provide 1782 kcal, 80 grams of protein, and 1523 ml free water.   Recommend obtaining new weight measurement to fully assess weight trends.   NUTRITION DIAGNOSIS:   Inadequate oral intake related to inability to eat as evidenced by NPO status.  GOAL:   Patient will meet greater than or equal to 90% of their needs  MONITOR:   TF tolerance, Skin, I & O's, Labs, Weight trends  REASON FOR ASSESSMENT:   New TF    ASSESSMENT:   88 year old male with history of A Flutter s/p ablation w/PPM, HTN, neuropathy, lung nodule, TVAR, HOH, dysphagia s/p G tube (placed 02/10/21) who was admitted on 03/02/21 with hypotension, dizziness, near syncope and hgb 5.9. FBOT positive with reports of dark stools as well as reports of Hgb of 7.0 and trauma to GI tract due to NGT per family. CIR recommended due to functional decline.  Pt unavailable during time of visit. Pt is currently NPO. Pt with continuation of bolus tube feeding regimen via PEG. Pt has been tolerating his tube feeds. RD to continue with current orders. Recommend obtaining new weight measurement to fully assess weight trends. Unable to complete Nutrition-Focused physical exam at this time.   ADDENDUM 1613: RD contacted via Jeannene Patella, PA. Pt with elevated potassium at 5.2. PA request trial of formula change to low potassium formula and monitor potassium levels. Noted Nepro formula is both low in potassium and phosphorous. Pt with low phosphorous of 2.4 on 1/27. Plans to recheck phosphorous and replete as needed. RD to switch formula to nepro for formula change trial  and monitor potassium and phosphorous levels.   Labs and medications reviewed.   Diet Order:   Diet Order             Diet NPO time specified  Diet effective now                   EDUCATION NEEDS:   Not appropriate for education at this time  Skin:  Skin Assessment: Skin Integrity Issues: Skin Integrity Issues:: DTI, Other (Comment) DTI: L heel Other: PI sacrum  Last BM:  1/29  Height:   Ht Readings from Last 1 Encounters:  03/10/21 '5\' 8"'  (1.727 m)    Weight:   Wt Readings from Last 1 Encounters:  03/13/21 107 kg   BMI:  Body mass index is 35.87 kg/m.  Estimated Nutritional Needs:   Kcal:  1650-1850  Protein:  85-95 grams  Fluid:  >/= 1.65 L/day  Corrin Parker, MS, RD, LDN RD pager number/after hours weekend pager number on Amion.

## 2021-03-13 NOTE — Progress Notes (Signed)
Occupational Therapy Session Note  Patient Details  Name: Gregory [name] MRN: [id number] Date of Birth: 03/25/1920  Today's Date: 03/13/2021 OT Individual Time: 1300-1355 OT Individual Time Calculation (min): 55 min    Short Term Goals: Week 1:  OT Short Term Goal 1 (Week 1): Pt will complete sit<stand from toilet with no more than Mod A OT Short Term Goal 2 (Week 1): Pt will complete UB dressing with Mod A OT Short Term Goal 3 (Week 1): Pt will complete 1/3 components of donning pants using AE or adaptive techniques as needed  Skilled Therapeutic Interventions/Progress Updates:    Pt resting in recliner upon arrival with son present. OT intervention with focus on sit<>stand and standing balance. Sit<>stand fluctuated between min A (x2), mod A (x4), and max A (1). Pt requires max verbal cues for sequencing and technique. Pt tends to initially stand with weight shifted onto heels but is able to correct with min A for standing balance during transition. Pt states he like to wear shoes but does not have any at hospital. Requested son to bring in shoes tomorrow. Pt fatigues easily and requires multiple rest break. Pt returned to room and transferred to recliner. Pt remained in recliner with all needs within reach. Son present.   Therapy Documentation Precautions:  Precautions Precautions: Fall Precaution Comments: NPO with PEG tube, posterior lean Restrictions Weight Bearing Restrictions: No  Pain:  Pt denies pain this afternoon  Therapy/Group: Individual Therapy  Leroy Libman 03/13/2021, 2:32 PM

## 2021-03-13 NOTE — Telephone Encounter (Signed)
Prescription refill request for Eliquis received. Indication:Afib Last office visit:8/22 Scr:1.4 Age: 88 Weight:107 kg  Prescription refilled

## 2021-03-13 NOTE — Progress Notes (Signed)
Inpatient Rehabilitation Care Coordinator °Assessment and Plan °Patient Details  °Name: Gregory [name] °MRN: [id number] °Date of Birth: 05/11/1920 ° °Today's Date: 03/13/2021 ° °Hospital Problems: Principal Problem: °  Debility ° °Past Medical History:  °Past Medical History:  °Diagnosis Date  ° Atrial flutter (HCC)   ° s/p ablation  ° Cancer (HCC)   ° skin cancer  ° Cataracts, bilateral   ° Hypertension   ° Neuromuscular disorder (HCC)   ° Pacemaker 2010  ° Thyroid disease   ° °Past Surgical History:  °Past Surgical History:  °Procedure Laterality Date  ° APPENDECTOMY    ° COLONOSCOPY    ° EYE SURGERY Bilateral   ° cataract surgery  ° HERNIA REPAIR Right   ° INGUINAL HERNIA REPAIR Left 10/12/2013  ° Procedure: LEFT  INGUINAL HERNIA REPAIR;  Surgeon: Todd J Rosenbower, MD;  Location: [facility] OR;  Service: General;  Laterality: Left;  ° INSERTION OF MESH Left 10/12/2013  ° Procedure: INSERTION OF MESH;  Surgeon: Todd J Rosenbower, MD;  Location: [facility] OR;  Service: General;  Laterality: Left;  ° PEG TUBE PLACEMENT Left   ° °Social History:  reports that he has never smoked. He has never used smokeless tobacco. He reports that he does not drink alcohol and does not use drugs. ° °Family / Support Systems °Marital Status: Widow/Widower °How Long?: 1 year ago °Patient Roles: Parent °Spouse/Significant Other: widowed °Children: Two adult children- Mike (lives with him in the home) and Jackie #[phone] (primary contact) °Other Supports: N/A °Anticipated Caregiver: Pt son Mike lives in the home °Ability/Limitations of Caregiver: NOne reported °Caregiver Availability: 24/7 °Family Dynamics: Patient's son Mike lives in the home with him. 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 °Education: some college °Health Literacy - How often do you need to have someone help you when you read instructions, pamphlets, or other written material from your doctor or pharmacy?:  Never °Writes: Yes °Employment Status: Retired °Legal History/Current Legal Issues: Denies °Guardian/Conservator: N/A  ° °Abuse/Neglect °Abuse/Neglect Assessment Can Be Completed: Yes °Physical Abuse: Denies °Verbal Abuse: Denies °Sexual Abuse: Denies °Exploitation of patient/patient's resources: Denies °Self-Neglect: Denies ° °Patient response to: °Social Isolation - How often do you feel lonely or isolated from those around you?: Never ° °Emotional Status °Pt's affect, behavior and adjustment status: Pt in good spirits at time of visit, just tired. pt appears to have some short term memory loss as when asked questions he indicated "I dont know." °Recent Psychosocial Issues: Denies °Psychiatric History: Denies °Substance Abuse History: Denies ° °Patient / Family Perceptions, Expectations & Goals °Pt/Family understanding of illness & functional limitations: Pt and family have a general understanding of pt care needs °Premorbid pt/family roles/activities: Independent °Anticipated changes in roles/activities/participation: Assistance with ADLs/IADls °Pt/family expectations/goals: Pt goal is to work on strnegth and movement. Pt son Mike reported his goals were for pt to be able to get  up and get around and get back as close as he can to being normal like before. ° °Community Resources °Community Agencies: None °Premorbid Home Care/DME Agencies: None °Transportation available at discharge: TBD °Is the patient able to respond to transportation needs?: Yes °In the past 12 months, has lack of transportation kept you from medical appointments or from getting medications?: No °In the past 12 months, has lack of transportation kept you from meetings, work, or from getting things needed for daily living?: No °Resource referrals recommended: Neuropsychology ° °Discharge Planning °Living Arrangements: Children °Support Systems: Children °  Children Type of Residence: Private residence Insurance Resources: [employer], Multimedia programmer  (specify) ([place]; Rx- [facility] [address]) Financial Screen Referred: No Living Expenses: Own Money Management: Patient Does the patient have any problems obtaining your medications?: No Home Management: Pt and pt son help manage home. THey do not prepare alot of meals, mainly eat out. Patient/Family Preliminary Plans: No changes Care Coordinator Barriers to Discharge: Decreased caregiver support, Lack of/limited family support Care Coordinator Anticipated Follow Up Needs: HH/OP Expected length of stay: 2-2.5 weeks  Clinical Impression SW met with pt and pt son [name] in room to introduce self, explain role, and discuss discharge process. 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 No HCPOA. Primary contact is his dtr [name]. DME: RW, w/c, cane and walk in shower with grab bars. Pt and son aware SW will f/u with [name].   1525-SW spoke with pt dtr [name] to introduce self, explain role, and discuss discharge process. SW informed on ELOS 2-2.5 weeks and will f/u after team conference with updates. Dtr expressed concerns about plan on when will Eliquis start again. SW informed will share with medical team ,and will share updates.   Rana Snare 03/13/2021, 3:42 PM

## 2021-03-14 ENCOUNTER — Inpatient Hospital Stay (HOSPITAL_COMMUNITY): Payer: Medicare Other

## 2021-03-14 ENCOUNTER — Encounter (HOSPITAL_COMMUNITY): Payer: Self-pay | Admitting: Physical Medicine and Rehabilitation

## 2021-03-14 DIAGNOSIS — I4811 Longstanding persistent atrial fibrillation: Secondary | ICD-10-CM

## 2021-03-14 DIAGNOSIS — Z952 Presence of prosthetic heart valve: Secondary | ICD-10-CM

## 2021-03-14 DIAGNOSIS — K921 Melena: Secondary | ICD-10-CM

## 2021-03-14 LAB — GLUCOSE, CAPILLARY
Glucose-Capillary: 82 mg/dL (ref 70–99)
Glucose-Capillary: 104 mg/dL — ABNORMAL HIGH (ref 70–99)
Glucose-Capillary: 116 mg/dL — ABNORMAL HIGH (ref 70–99)
Glucose-Capillary: 121 mg/dL — ABNORMAL HIGH (ref 70–99)

## 2021-03-14 LAB — BASIC METABOLIC PANEL
Anion gap: 9 (ref 5–15)
BUN: 32 mg/dL — ABNORMAL HIGH (ref 8–23)
CO2: 25 mmol/L (ref 22–32)
Calcium: 8.5 mg/dL — ABNORMAL LOW (ref 8.9–10.3)
Chloride: 102 mmol/L (ref 98–111)
Creatinine, Ser: 1.33 mg/dL — ABNORMAL HIGH (ref 0.61–1.24)
GFR, Estimated: 48 mL/min — ABNORMAL LOW (ref >60)
Glucose, Bld: 94 mg/dL (ref 70–99)
Potassium: 4.8 mmol/L (ref 3.5–5.1)
Sodium: 136 mmol/L (ref 135–145)

## 2021-03-14 LAB — CBC
HCT: 27.3 % — ABNORMAL LOW (ref 39.0–52.0)
Hemoglobin: 8.9 g/dL — ABNORMAL LOW (ref 13.0–17.0)
MCH: 31.4 pg (ref 26.0–34.0)
MCHC: 32.6 g/dL (ref 30.0–36.0)
MCV: 96.5 fL (ref 80.0–100.0)
Platelets: 131 10*3/uL — ABNORMAL LOW (ref 150–400)
RBC: 2.83 MIL/uL — ABNORMAL LOW (ref 4.22–5.81)
RDW: 16.9 % — ABNORMAL HIGH (ref 11.5–15.5)
WBC: 10.5 10*3/uL (ref 4.0–10.5)
nRBC: 0 % (ref 0.0–0.2)

## 2021-03-14 LAB — OCCULT BLOOD X 1 CARD TO LAB, STOOL: Fecal Occult Bld: POSITIVE — AB

## 2021-03-14 LAB — PHOSPHORUS: Phosphorus: 3.7 mg/dL (ref 2.5–4.6)

## 2021-03-14 MED ORDER — PANTOPRAZOLE 2 MG/ML SUSPENSION
40.0000 mg | Freq: Two times a day (BID) | ORAL | Status: DC
  Administered 2021-03-15 – 2021-03-31 (×34): 40 mg
  Filled 2021-03-14 (×34): qty 20

## 2021-03-14 MED ORDER — CHLORHEXIDINE GLUCONATE 0.12 % MT SOLN
15.0000 mL | Freq: Two times a day (BID) | OROMUCOSAL | Status: DC
  Administered 2021-03-14 – 2021-03-31 (×31): 15 mL via OROMUCOSAL
  Filled 2021-03-14 (×32): qty 15

## 2021-03-14 MED ORDER — CHLORHEXIDINE GLUCONATE 0.12 % MT SOLN
15.0000 mL | Freq: Two times a day (BID) | OROMUCOSAL | Status: DC
  Filled 2021-03-14: qty 15

## 2021-03-14 MED ORDER — ORAL CARE MOUTH RINSE
15.0000 mL | Freq: Four times a day (QID) | OROMUCOSAL | Status: DC
  Administered 2021-03-14 – 2021-03-31 (×62): 15 mL via OROMUCOSAL

## 2021-03-14 MED ORDER — CHLORHEXIDINE GLUCONATE 0.12 % MT SOLN: 15.0000 mL | Freq: Four times a day (QID) | OROMUCOSAL | Status: DC

## 2021-03-14 NOTE — Progress Notes (Signed)
Occupational Therapy Session Note  Patient Details  Name: Krishay Faro MRN: [id number] Date of Birth: [birth year]  Today's Date: 03/14/2021 OT Individual Time: 5638-9373 OT Individual Time Calculation (min): 58 min    Short Term Goals: Week 1:  OT Short Term Goal 1 (Week 1): Pt will complete sit<stand from toilet with no more than Mod A OT Short Term Goal 2 (Week 1): Pt will complete UB dressing with Mod A OT Short Term Goal 3 (Week 1): Pt will complete 1/3 components of donning pants using AE or adaptive techniques as needed  Skilled Therapeutic Interventions/Progress Updates:    Pt resting in recliner upon arrival. Son and Daughter present. Pt transported to Day Room for standing activities and BUE/BLE therex for endurance. 5 mins level 1 on NuStep. Sit<>stand x 7 with min A. Pt requires max verbal cues for BLE and BUE placement prior to sit<>stand. Standing balance with min A/CGA for static standing activities at table. Amb with RW 25'x2 with min A. Extended rest breaks with blankets. Pt returned to room and amb with RW to recliner. Pt remained in recliner covered with blankets. Seat alarm activated. Daughter present. All needs within reach.   Therapy Documentation Precautions:  Precautions Precautions: Fall Precaution Comments: NPO with PEG tube, posterior lean Restrictions Weight Bearing Restrictions: No  Pain:  Pt denies pain this afternoon ADL: ADL Eating: NPO Grooming: Moderate assistance (due to hand tremors and strength deficits) Where Assessed-Grooming: Chair Upper Body Bathing: Moderate assistance (for sitting balance) Where Assessed-Upper Body Bathing: Edge of bed Lower Body Bathing: Maximal assistance Where Assessed-Lower Body Bathing: Edge of bed Upper Body Dressing: Maximal assistance Where Assessed-Upper Body Dressing: Edge of bed Lower Body Dressing: Dependent Where Assessed-Lower Body Dressing: Edge of bed Toileting: Maximal assistance Where  Assessed-Toileting: Glass blower/designer: Moderate assistance (Min A ambulatory transfer to the toilet using RW, Max A for sit<stand from standard toilet. Per daughter, pt has elevated toilet seat at home with toilet bars) Tub/Shower Transfer: Not assessed   Therapy/Group: Individual Therapy  Leroy Libman 03/14/2021, 2:44 PM

## 2021-03-14 NOTE — Progress Notes (Signed)
Modified Barium Swallow Progress Note  Patient Details  Name: Gregory [name] MRN: [id number] Date of Birth: 1920-05-03  Today's Date: 03/14/2021  Modified Barium Swallow completed.  Full report located under Chart Review in the Imaging Section.  Brief recommendations include the following:  Clinical Impression   Pt presents with severe-moderate pharyngeal dysphasia due to limited epiglottis inversion, abnormal epiglottic structure, pharyngeal residue and reduced UES opening. Pt initiated swallow at pyriform sinuses for thin liquids and the valleculae for nectar thick liquids and dys 1 textures. Pt demonstrates reduced anterior laryngeal movement along with the abnormal anteriorly-curled structure of the epiglottis resulting in incomplete epiglottic inversion and reduced laryngeal vestibule closure. These impairments mentioned above lead to severe-moderate residue in both the valleculae and pyriform sinuses that required several swallows (3 or more) to reduce the pyriform residue to trace-mild and the valleculae residue to mild-moderate. The residue in the valleculae never completely cleared even with compensatory strategies of effortful swallow, chin tuck and supraglottic swallow. Pt demonstrated penetration consistent (PAS 2 and PAS 3) and inconsistent silent aspiration (PAS 8) when consuming 5cc of thin liquids and nectar thick liquids, with and without compensatory strategy of a chin tuck. Silent aspiration occurred during and after the swallow due to pyriform residue spilling into the laryngeal vestibule. Pt demonstrated x1 occurrence of delayed sensed aspiration (PAS 7) during the swallow, following a dys 1 texture trial, however the barium appeared thinner likely nectar thick or thin liquid residue from pyriform sinuses. Pt was able to demonstrate an effective cough following the sensed aspiration occurrence and produced barium coated mucus to oral cavity. SLP recommends continuing diet of NPO,  requiring alternative means of nutrition, due to the occurrence of aspiration noted and risk of further aspiration from uncleared pharyngeal residue. SLP recommends educating pt and pt's family pertaining to risk of aspiration and use of small amounts of ice chips/thin liquid via TSP for pleasure only, when utilizing multiple swallows and following oral. SLP will target pharyngeal strength to increase anterior laryngeal movement and reduce pharyngeal residue using CTAR and EMST exercises     Swallow Evaluation Recommendations       SLP Diet Recommendations: NPO;Alternative means - long-term;Ice chips PRN after oral care       Medication Administration: Via alternative means   Supervision: Patient able to self feed;Full supervision/cueing for compensatory strategies (with ice chips for pleasure)   Compensations: Multiple dry swallows after each bite/sip;Small sips/bites;Slow rate;Minimize environmental distractions   Postural Changes: Seated upright at 90 degrees;Remain semi-upright after after feeds/meals (Comment)   Oral Care Recommendations: Oral care BID        Gregory [name] 03/14/2021,12:51 PM

## 2021-03-14 NOTE — Progress Notes (Signed)
Physical Therapy Session Note  Patient Details  Name: Gregory [name] MRN: [id number] Date of Birth: [birth year]  Today's Date: 03/14/2021 PT Individual Time: 1000-1055 PT Individual Time Calculation (min): 55 min   Short Term Goals: Week 1:  PT Short Term Goal 1 (Week 1): Pt will perform supine<>sit with min assist PT Short Term Goal 2 (Week 1): Pt will perform sit<>stand transfers using LRAD with min assist PT Short Term Goal 3 (Week 1): Pt will perform stand pivot transfers using LRAD with min assist PT Short Term Goal 4 (Week 1): Pt will ambulate at least 155ft using LRAD with min assist PT Short Term Goal 5 (Week 1): Pt will navigate 4 stairs using HRs with min assist  Skilled Therapeutic Interventions/Progress Updates:    Pt received seated in bed, agreeable to PT session. No complaints of pain this AM. Supine to sit with min A for trunk elevation, HOB elevated and use of bedrail. Sit to stand with mod A initially to RW. Pt requesting to toilet, ambulatory transfer into bathroom with RW and CGA for balance. Pt exhibits shuffling gait pattern with decreased LE clearance when stepping. Toilet transfer with min A and cues for safe UE support on grab bar and/or RW. Pt is max A for clothing management, dependent for pericare and brief change. Ambulation x 150 ft, x 100 ft with RW and CGA for balance, improved endurance for gait this date. Pt does exhibit flexed head/trunk during gait. Standing alt L/R 4" step-taps with RW and CGA for balance, 2 x 10 reps. Progression to alt L/R 4" step-ups with RW and min A for balance, 2 x 10 reps with seated rest break between each set. Sit to stand x 5 reps to RW with focus on safe UE placement, CGA to min A for transfer from mat table. Pt agreeable to remain seated in recliner in room at end of session, needs in reach, son present.  Therapy Documentation Precautions:  Precautions Precautions: Fall Precaution Comments: NPO with PEG tube, posterior  lean Restrictions Weight Bearing Restrictions: No    Therapy/Group: Individual Therapy   Excell Seltzer, PT, DPT, CSRS  03/14/2021, 12:07 PM

## 2021-03-14 NOTE — Patient Care Conference (Signed)
Inpatient RehabilitationTeam Conference and Plan of Care Update Date: 03/14/2021   Time: 11:10 AM    Patient Name: Gregory [name]      Medical Record Number: [id number]  Date of Birth: [birth year] Sex: Male         Room/Bed: 4M12C/4M12C-01 Payor Info: Payor: MEDICARE / Plan: MEDICARE PART A AND B / Product Type: *No Product type* /    Admit Date/Time:  03/10/2021  5:21 PM  Primary Diagnosis:  [facility] Problems: Principal Problem:   Debility    Expected Discharge Date: Expected Discharge Date: 03/24/21  Team Members Present: Physician leading conference: Dr. Courtney Heys Social Worker Present: [name], [place] Nurse Present: Dorthula Nettles, RN PT Present: Excell Seltzer, PT OT Present: Roanna Epley, [place], OT SLP Present: Charolett Bumpers, SLP PPS Coordinator present : Gunnar Fusi, SLP     Current Status/Progress Goal Weekly Team Focus  Bowel/Bladder   Patient cont. of B/B  remain cont. of b/b  Assess qshift & PRN   Swallow/Nutrition/ Hydration   NPO, ice chips for pleasure and education of aspiration risk with family  Supervision A  education and ice chip trials   ADL's   bathing/dressing-mod A: functional tranfsers-min A/mod A; fatigues quickly  supervision overall  activity tolerance, BADLs, functional transfers, education, safety awareness   Mobility   min to mod A overall, gait up to 70 ft with RW and min A, mod A stairs  Supervision overall, CGA stairs  endurance, LE strengthening, balance, gait, stairs   Communication             Safety/Cognition/ Behavioral Observations  max-mod A  Supervision A  basic problem solving, recall and error awareness   Pain   No c/o pain  remain free from pain  Assess qshift & PRN   Skin   Right heel stage 1 deep tissue injury, left heel stage 2, Stage 1 laterally on both feet, Toes black  Patient will experience timely healing of wounds without complications.  Assess q shift & PRN     Discharge  Planning:  D/c to home with his son Gillis Santa will provide 24/7 care. Pt on TF prior to admission and will remain on TF at discharge.   Team Discussion: Explained risk/benefits of Eliquis to son. Focus on GIB now, still having black stools. Tube feeds increasing K+. Changed to Nepro. Multiple pressure injuries, continue to monitor. Going home with son, daughter is primary contact. Request Ab-binder to cover peg and keep tube in place. Son managing peg PTA.  Patient on target to meet rehab goals: yes, supervision to contact guard goals. Min assist overall currently. Mod assist stairs. Mod assist ADL's, STS, and transfers. Requested son to bring in shoes. MBD showed silent aspiration. Residue just sits and then eventually aspirates. Mod/max assist recall and error awareness.   *See Care Plan and progress notes for long and short-term goals.   Revisions to Treatment Plan:  Adjusting medications, tube feed formula.  Teaching Needs: Family education, medication management, skin/wound care, transfer/gait training, safety awareness, etc.   Current Barriers to Discharge: Decreased caregiver support, Wound care, Lack of/limited family support, Weight, and Medication compliance  Possible Resolutions to Barriers: Family education     Medical Summary Current Status: NPO/GI bleed- still has black tarry stools- continent B/B- no pain; has multiple pressure injuries- heels/sides of feet; sacrum- L heel DTI and mid asacrum are left.  Barriers to Discharge: Decreased family/caregiver support;Home enviroment access/layout;Medical stability;Medication compliance;Nutrition means;Other (comments);Weight;Wound care  Barriers to Discharge Comments: is NPO- going home with son; but daughter primary contact; very HOH- limiters possible GI bleed Possible Resolutions to [employer] Focus: min-mod A with PT- low endurance- no low BP/just fatigue;  called [facility] ot deal with Eliquis- waiting to hear back-  possible GI bleed- slow is the main limiter and high K+-  needs shoes!  d/c date- 03/24/21- silent frank aspiration during MBS- residue stays there and wil aspirate! so needs NPO- education about risk of aspiraiton- ice chips for pleasure-   Continued Need for Acute Rehabilitation Level of Care: The patient requires daily medical management by a physician with specialized training in physical medicine and rehabilitation for the following reasons: Direction of a multidisciplinary physical rehabilitation program to maximize functional independence : Yes Medical management of patient stability for increased activity during participation in an intensive rehabilitation regime.: Yes Analysis of laboratory values and/or radiology reports with any subsequent need for medication adjustment and/or medical intervention. : Yes   I attest that I was present, lead the team conference, and concur with the assessment and plan of the team.   Cristi Loron 03/14/2021, 4:51 PM

## 2021-03-14 NOTE — Progress Notes (Addendum)
Daily Rounding Note  03/14/2021, 11:44 AM  LOS: 4 days   SUBJECTIVE:   Chief complaint:    Persistent black stool.  Question timing/appropriateness of restarting Eliquis  Seen by GI on 03/02/2020 for FOBT positive anemia in setting of Eliquis.  Previous TAVR, pacemaker.  He had had PEG tube placed on 12/30 at [facility] in [place] for dysphagia.  Had blood loss anemia requiring multiple PRBCs afterwards 02/10/2021 esophagram showed difficulty completing consecutive swallows, significant aspiration and study terminated.  Melena developed.  There was some question whether the PEG bumper was tight and caused ulcer.  Had Hgb 5.9.  1 PRBC on 1/19 GI added Protonix 40 mg IV twice daily.  There was contemplation of EGD after Eliquis washout this was never performed. Continues having black stools despite being off Eliquis.  Last was this morning and observed by PT though patient himself denies black stool or even having a bowel movement this morning.  He denies abdominal pain, nausea, vomiting. Hgb over the last few days has gone from 5.9 on 1/19 -9.6 a week later- 8.9 -10.2, now stable for the last 2 days at 8.9.  Platelets as low as 86, 131 today.  Normal MCV.  Improving AKI with GFR today 48. Normal iron, iron sats, folate.  Low TIBC.  Ferritin 584.  B12 1247.  INR 1.5 on 1/19 Has not had any GI imaging. Family wants patient back on Eliquis.   Palliative care consult resulting in decision to keep the patient full code, receive full resuscitation of CPR, ventilator if necessary.  OBJECTIVE:         Vital signs in last 24 hours:    Temp:  [98.3 F (36.8 C)-98.6 F (37 C)] 98.6 F (37 C) (01/31 0300) Pulse Rate:  [70-74] 72 (01/31 0300) Resp:  [17-18] 18 (01/31 0300) BP: (109-114)/(48-58) 113/58 (01/31 0300) SpO2:  [93 %-99 %] 93 % (01/31 0300) Weight:  [107 kg] 107 kg (01/31 0502) Last BM Date: 03/12/21 Filed Weights   03/10/21  1737 03/13/21 0500 03/14/21 0502  Weight: 73 kg 107 kg 107 kg   General: Frail, elderly, comfortable, not acutely ill Heart: RRR. Chest: Clear bilaterally without labored breathing.  Occasional wet quality cough Abdomen: Soft, nontender.  PEG site benign. Extremities: No CCE Neuro/Psych: Follows commands.  Moves all 4 limbs  Intake/Output from previous day: 01/30 0701 - 01/31 0700 In: -  Out: 2750 [Urine:2750]  Intake/Output this shift: No intake/output data recorded.  Lab Results: Recent Labs    03/13/21 0611 03/14/21 0518  WBC 10.0 10.5  HGB 8.9* 8.9*  HCT 28.0* 27.3*  PLT 123* 131*   BMET Recent Labs    03/13/21 0611 03/14/21 0518  NA 135 136  K 5.2* 4.8  CL 102 102  CO2 27 25  GLUCOSE 92 94  BUN 34* 32*  CREATININE 1.42* 1.33*  CALCIUM 8.3* 8.5*   LFT Recent Labs    03/13/21 0611  PROT 5.5*  ALBUMIN 2.4*  AST 35  ALT 30  ALKPHOS 55  BILITOT 0.7   PT/INR No results for input(s): LABPROT, INR in the last 72 hours. Hepatitis Panel No results for input(s): HEPBSAG, HCVAB, HEPAIGM, HEPBIGM in the last 72 hours.  Studies/Results: No results found.  ASSESMENT:     GI bleed exhibited by black stools.    Persistent anemia.    Dysphagia.  Peg placed late 01/2022 in [place].  Developed bleeding, blood loss anemia  afterwards requiring several PRBCs.    Chronic Eliquis on hold.  Hx a flutter, ablation, a flutter.Marland Kitchen  TAVR 2020.  Family wants this restarted as soon as possible   PLAN     Per Dr Lorenso Courier.  ? EGD??     Azucena Freed  03/14/2021, 11:44 AM Phone [phone]

## 2021-03-14 NOTE — Progress Notes (Signed)
PROGRESS NOTE   Subjective/Complaints:  Pt reports LBM yesterday- still black.  Just woke up- doesn't know how feeling except denies pain.    ROS:   Pt denies SOB, (-) abd pain, CP, N/V/C/D, and vision changes  Objective:   No results found. Recent Labs    03/13/21 0611 03/14/21 0518  WBC 10.0 10.5  HGB 8.9* 8.9*  HCT 28.0* 27.3*  PLT 123* 131*   Recent Labs    03/13/21 0611 03/14/21 0518  NA 135 136  K 5.2* 4.8  CL 102 102  CO2 27 25  GLUCOSE 92 94  BUN 34* 32*  CREATININE 1.42* 1.33*  CALCIUM 8.3* 8.5*    Intake/Output Summary (Last 24 hours) at 03/14/2021 1018 Last data filed at 03/14/2021 8144 Gross per 24 hour  Intake --  Output 2550 ml  Net -2550 ml     Pressure Injury 03/03/21 Sacrum Mid (Active)  03/03/21 1108  Location: Sacrum  Location Orientation: Mid  Staging:   Wound Description (Comments):   Present on Admission:      Pressure Injury 03/03/21 Heel Left Deep Tissue Pressure Injury - Purple or maroon localized area of discolored intact skin or blood-filled blister due to damage of underlying soft tissue from pressure and/or shear. (Active)  03/03/21 1114  Location: Heel  Location Orientation: Left  Staging: Deep Tissue Pressure Injury - Purple or maroon localized area of discolored intact skin or blood-filled blister due to damage of underlying soft tissue from pressure and/or shear.  Wound Description (Comments):   Present on Admission: Yes    Physical Exam: Vital Signs Blood pressure (!) 113/58, pulse 72, temperature 98.6 F (37 C), temperature source Oral, resp. rate 18, height 5\' 8"  (1.727 m), weight 107 kg, SpO2 93 %.      General: awake, alert, appropriate, supine in bed; NAD HENT: conjugate gaze; oropharynx moist CV: regular rate- irregular after prolonged listening; no JVD Pulmonary: CTA B/L; no W/R/R- good air movement GI: soft, NT, ND, (+)BS- not TTP even with deep  palpation.  Psychiatric: appropriate- flat; sleepy Neurological: alert, sleepy, but very HOH Ext: no clubbing, cyanosis, or edema Psych: pleasant and cooperative  Psych: Pt's affect is appropriate. Pt is cooperative Skin: foot/heel wounds Neuro:  HOH but able comprehend when he's able to hear. No focal language issues. Mild dysarthria. Strength grossly 4/5 bilateral UE/LE's. No focal sensory abnl. No abnl tone.  Musculoskeletal: Full ROM, No pain with AROM or PROM in the neck, trunk, or extremities. Posture appropriate     Assessment/Plan: 1. Functional deficits which require 3+ hours per day of interdisciplinary therapy in a comprehensive inpatient rehab setting. Physiatrist is providing close team supervision and 24 hour management of active medical problems listed below. Physiatrist and rehab team continue to assess barriers to discharge/monitor patient progress toward functional and medical goals  Care Tool:  Bathing    Body parts bathed by patient: Right arm, Left arm, Chest, Abdomen, Front perineal area, Right upper leg, Left upper leg, Face   Body parts bathed by helper: Buttocks, Right lower leg, Left lower leg     Bathing assist Assist Level: Maximal Assistance - Patient 24 - 49%  Upper Body Dressing/Undressing Upper body dressing   What is the patient wearing?: Hospital gown only    Upper body assist Assist Level: Moderate Assistance - Patient 50 - 74%    Lower Body Dressing/Undressing Lower body dressing      What is the patient wearing?: Incontinence brief, Hospital gown only     Lower body assist Assist for lower body dressing: Moderate Assistance - Patient 50 - 74%     Toileting Toileting    Toileting assist Assist for toileting: Moderate Assistance - Patient 50 - 74%     Transfers Chair/bed transfer  Transfers assist     Chair/bed transfer assist level: Minimal Assistance - Patient > 75%     Locomotion Ambulation   Ambulation assist    Ambulation activity did not occur: Safety/medical concerns (pt uses QC at baseline but requires use of RW for safe ambulation)  Assist level: Contact Guard/Touching assist Assistive device: Walker-rolling Max distance: 150'   Walk 10 feet activity   Assist  Walk 10 feet activity did not occur: Safety/medical concerns  Assist level: Contact Guard/Touching assist Assistive device: Walker-rolling   Walk 50 feet activity   Assist Walk 50 feet with 2 turns activity did not occur: Safety/medical concerns  Assist level: Contact Guard/Touching assist Assistive device: Walker-rolling    Walk 150 feet activity   Assist Walk 150 feet activity did not occur: Safety/medical concerns  Assist level: Contact Guard/Touching assist Assistive device: Walker-rolling    Walk 10 feet on uneven surface  activity   Assist Walk 10 feet on uneven surfaces activity did not occur: Safety/medical concerns         Wheelchair     Assist Is the patient using a wheelchair?: Yes Type of Wheelchair: Manual    Wheelchair assist level: Dependent - Patient 0%      Wheelchair 50 feet with 2 turns activity    Assist        Assist Level: Dependent - Patient 0%   Wheelchair 150 feet activity     Assist      Assist Level: Dependent - Patient 0%   Blood pressure (!) 113/58, pulse 72, temperature 98.6 F (37 C), temperature source Oral, resp. rate 18, height 5\' 8"  (1.727 m), weight 107 kg, SpO2 93 %.  Medical Problem List and Plan: 1. Functional deficits secondary to debility             -patient may shower but PEG must be covered.              -ELOS/Goals: 2-3 weeks S-MinA             -Continue CIR- PT, OT and SLP  -team conference today to determine length of stay.  2.  Antithrombotics: -DVT/anticoagulation:  Mechanical: Sequential compression devices, below knee Bilateral lower extremities 1/31- although Hb is stable, pt still having black tarry stools- will consult  Cards about Eliquis- I think it's concerning to restart right now, but will let Cards decide.              -antiplatelet therapy: N/A 3. Pain Management: Tylenol 4. Mood:  LCSW to follow              -antipsychotic agents: N/A 5. Neuropsych: This patient is capable of making decisions on his own behalf. 6. Skin/Wound Care: pressure relief, protection to feet/heels 7. Fluids/Electrolytes/Nutrition: continue TF  -hyperkalemia: lokelma 5mg  daily x 2 doses, recheck labs Monday   8. A flutter s/p TAVR 2020: plan  is to hold eliquis for now and resume in the next few weeks potentially --HR controlled 1/29 1/30- hold eliquis- likely still bleeding- will call Cards in AM to see their plan.  9. Severe dysphagia w/aspiration: NPO with tube feeds for nutritonal support.  10. GIB: Monitor for signs of bleeding. Continue Protonix and Carafate. Holding Eliquis.  -1/28 hgb up to 10.6 --> recheck monday. 1/30- Hb 8.9- was 10.2- but previous was 8.9 so likely not bleeding severely, but will recheck labs in AM 1/31- Hb stable at 8.9- will monitor 11. CKD 3a: BUN/SCr 124/2.07             --improved to 35/1.4 on 1/28  1/30- Cr 1.42 and BUN 34- overall stable- con't to monitor  -monitor output, labs Monday   1/31- Cr down slightly to 1.33 and BUN 32- con't to monitor 12. Constipation: Continue Senna daily.  13. HLD: continue atorvastatin 40mg  daily.  14. Hypothyroidism: continue Synthroid.  15. Hyperkalemia- likely due to TfF'- has PEG.   1/31- given multiple doses of Lokelma- down to 4.8- but will recheck again Thursday.  16. NPO-due to esophageal stricture/severe dysphagia on TF's  1/31- 1 reason came into hospital with failure to thrive due to not eating/drinking- per chart review- SLP involved, but not sure if will be able to not use PEG.     I spent a total of 45   minutes on total care today- >50% coordination of care- due to  speaking with PA, calling GI/cards and team conference.    LOS: 4  days A FACE TO FACE EVALUATION WAS PERFORMED  Gregory [name] 03/14/2021, 10:18 AM

## 2021-03-14 NOTE — Progress Notes (Signed)
Patient ID: Gregory [name], male   DOB: 1920-06-10, 88 y.o.   MRN: [id number]  SW met with pt and pt son [name] in room to provide updates from team conference, and d/c date 03/24/21. Pt appeared to be sleeping during visit as he closed his eyes while SW in room. SW informed received message about pt being on Nutren 1.5 at home. SW informed will continue to provide updates.  1424-SW received message from [name] with [facility] [phone]) who reporting that they were following patient for home care at discharge.   16- SW left message for pt dtr [name] [phone]) to provide updates from team conference and d/c date. SW waiting on follow-up.   Holly spoke with RD Colletta [place] to inform on TF-Nutren 1.5 at home; does not carry Nutren 1.5, however, Osmolite 1.5 closest equivalent. Reports will f/u with physician later in the week to discuss when pt will switch back to Osmolite since the change was due to potassium being too high. Reports he will likely be able to continue to use Nutren 1.5 when he returns home.  [name], MSW, [place] Office: [phone] Cell: [phone] Fax: [phone]

## 2021-03-14 NOTE — Progress Notes (Signed)
Occupational Therapy Session Note  Patient Details  Name: Gregory [name] MRN: [id number] Date of Birth: 24-Nov-1920  Today's Date: 03/14/2021 OT Individual Time: 4098-1191 OT Individual Time Calculation (min): 31 min    Short Term Goals: Week 1:  OT Short Term Goal 1 (Week 1): Pt will complete sit<stand from toilet with no more than Mod A OT Short Term Goal 2 (Week 1): Pt will complete UB dressing with Mod A OT Short Term Goal 3 (Week 1): Pt will complete 1/3 components of donning pants using AE or adaptive techniques as needed   Skilled Therapeutic Interventions/Progress Updates:    Pt greeted at time of session sitting up in recliner with family present who remained throughout session. No pain reported. Family member having several questions regarding pt feeding schedule, and relayed questions to nursing who came in at end of session to speak with pt/family. When pt transitioned sit > stand, urgently needed to use restroom and ambulated recliner <> bathroom CGA and transferred to commode same manner. Needed Mod A to stand up from low commode seat, clothing management needing Mod A as well. Set up in recliner alarm on call bell in reach and nursing present for tube feeds.  Therapy Documentation Precautions:  Precautions Precautions: Fall Precaution Comments: NPO with PEG tube, posterior lean Restrictions Weight Bearing Restrictions: No     Therapy/Group: Individual Therapy  Viona Gilmore 03/14/2021, 1:00 PM

## 2021-03-14 NOTE — Progress Notes (Signed)
Speech Language Pathology Daily Session Note  Patient Details  Name: Anguel Delapena MRN: [id number] Date of Birth: May 20, 1920  Today's Date: 03/14/2021 SLP Individual Time: 1305-1330 SLP Individual Time Calculation (min): 25 min  Short Term Goals: Week 1: SLP Short Term Goal 1 (Week 1): Patient will demonstrate functional problem solving for basic, novel tasks (using a RW, etc)  with Min verbal cues. SLP Short Term Goal 2 (Week 1): Patient will recall the steps for basic, novel tasks (using a RW, etc)  with Mod verbal cues. SLP Short Term Goal 3 (Week 1): Patient will participate in RMT exercises to improve vocal and cough strength with Mod verbal cues for accuracy and a self-perceived effort level of 7/10. SLP Short Term Goal 4 (Week 1): Pt will participate in instrumental swallow assessment to determine swallow function, least restrictive diet and risk of aspiration.  Skilled Therapeutic Interventions:Skilled ST services focused on education. SLP provided education to pt, pt's daughter and pt's son pertaining to findings in today's MBS. SLP informed pt and family that swallow function was impaired (see report for details) resulting in silent and sensed aspiration. SLP recommended continuing NPO and alterative means of nutrition with ice chips/ thin via TSP for pleasure only. Pt can consume this in small amounts, with the use of multiple swallows (3 or more) following good oral care. SLP educated pt and pt's family the high risk of aspiration due to reduced airway protection and pharyngeal residue. SLP informed pt and pt's family about RMT and CTAR exercises that will be initiated in ST services. Pt and pt's family asked appropriate questions and all answered to satisfaction.  Pt was left in room with family, call bell within reach and chair alarm set. SLP recommends to continue skilled services.     Pain Pain Assessment Pain Score: 0-No pain  Therapy/Group: Individual Therapy  Ladarryl Wrage   [facility] 03/14/2021, 3:36 PM

## 2021-03-14 NOTE — Consult Note (Signed)
Cardiology Consultation:   Patient ID: Aadam Zhen MRN: [id number]; DOB: 01-28-1921  Admit date: 03/10/2021 Date of Consult: 03/14/2021  PCP:  Orpah Greek, DO   [place] Providers Cardiologist:  Peter Martinique, MD        Patient Profile:   Calhoun Reichardt is a 88 y.o. male with a hx of  atrial flutter/fib and PPM, AS with TAVR   who is being seen 03/14/2021 for the evaluation of anticoagulation at the request of Dr. Dagoberto Ligas.  History of Present Illness:   Mr. Weyenberg with above hx including bradycardia with PPM and last gen change at [facility] in [place], [place] (most of health care is at [facility])  [place] on eliquis has had flutter ablation. He is in permanent a fib now. He had AS and TAVR with #26 Sapien S3 Ultra valve in [place] [address], [place]. Cardiac cath at that time showed obstructive disease in the distal RCA treated medically.  He has chronic anemia, and CKD,  hx of COVID 19 in June.  He has had wt loss since then.   Other hx of oropharyngeal dysphagia status post PEG tube placement in December 2022, hyperlipidemia, hypothyroidism,   Pt admitted 03/02/21 with dizziness and syncope.  With PEG placement pt had acute anemia requiring blood transfusions.  On arrival to [facility] BP 96/39 P 70s afebrile.  Hgb 5.9 transfused 2 units.  +FOBT, Eliquis held.  And when condition improved and transferred to CIR at [facility], pt preferred to hold off eliquis in short term.   GI did see pt here and plan for supportive care rather than EGD. Palliative care saw the pt as well.  Plan for full Code, sull scope, follow CBC as outpt.   Goal was to keep Hgb >7  he rec'd 4 units PRBC.    Pt in rehab and would like to resume Eliquis.  Today pt still heme +.    CBC today 8.9 WBC 10.5 plts 131  Plts as low as 86k.  Hgb has been stable for about 5 days.    BMP Na 136, K+ 4.8 BUN 32 Cr 1.33  EKG:  The EKG was personally reviewed and demonstrates:  atrial fib with ventricular pacing.  Telemetry:   Telemetry was personally reviewed and demonstrates:   no tele  BP 113/58 P 72 R 18 T 98.6  No chest pain no SOB Just had modified swallowing study.  Still NPO but can have ice chips.    Past Medical History:  Diagnosis Date   Atrial flutter [facility])    s/p ablation   Cancer [facility])    skin cancer   Cataracts, bilateral    Hypertension    Neuromuscular disorder ([place])    Pacemaker 2010   Thyroid disease     Past Surgical History:  Procedure Laterality Date   APPENDECTOMY     COLONOSCOPY     EYE SURGERY Bilateral    cataract surgery   HERNIA REPAIR Right    INGUINAL HERNIA REPAIR Left 10/12/2013   Procedure: LEFT  INGUINAL HERNIA REPAIR;  Surgeon: Odis Hollingshead, MD;  Location: [place];  Service: General;  Laterality: Left;   INSERTION OF MESH Left 10/12/2013   Procedure: INSERTION OF MESH;  Surgeon: Odis Hollingshead, MD;  Location: [place];  Service: General;  Laterality: Left;   PEG TUBE PLACEMENT Left      Home Medications:  Prior to Admission medications   Medication Sig Start Date End Date Taking? Authorizing Provider  atorvastatin (LIPITOR)  40 MG tablet Take 40 mg by mouth at bedtime.    [provider]  CALCIUM PO Take 1 tablet by mouth daily.    [provider]  Cholecalciferol (VITAMIN D3 PO) Take 1 tablet by mouth daily.    [provider]  ELIQUIS 2.5 MG TABS tablet TAKE 1 TABLET BY MOUTH TWICE A DAY 03/13/21   Martinique, Peter M, MD  folic acid (FOLVITE) 1 MG tablet Take 1 mg by mouth daily.    [provider]  guaiFENesin-dextromethorphan (ROBITUSSIN DM) 100-10 MG/5ML syrup Take 5 mLs by mouth 2 (two) times daily. 03/10/21   Marianna Payment, MD  levothyroxine (SYNTHROID) 75 MCG tablet Take 75 mcg by mouth daily. 01/26/19   [provider]  Multiple Vitamin (MULTIVITAMIN) tablet Take 1 tablet by mouth daily.    [provider]  NIACIN PO Take 1 tablet by mouth daily.    [provider]  Nutritional Supplements  (NUTREN 1.5 EN) 250 mLs by Enteral route in the morning, at noon, in the evening, and at bedtime.    [provider]  pantoprazole sodium (PROTONIX) 40 mg Place 40 mg into feeding tube daily. 03/10/21 06/08/21  Marianna Payment, MD  polyethylene glycol (MIRALAX) 17 g packet Place 17 g into feeding tube daily as needed for mild constipation or moderate constipation. 03/10/21   Marianna Payment, MD  senna (SENOKOT) 8.6 MG TABS tablet Place 1 tablet (8.6 mg total) into feeding tube at bedtime as needed for mild constipation. 03/10/21   Marianna Payment, MD  sucralfate (CARAFATE) 1 GM/10ML suspension Place 1 g into feeding tube every 6 (six) hours.    [provider]  vitamin B-12 (CYANOCOBALAMIN) 1000 MCG tablet Take 1,000 mcg by mouth daily.     [provider]  vitamin C (ASCORBIC ACID) 500 MG tablet Take 500 mg by mouth daily.    [provider]    Inpatient Medications: Scheduled Meds:  atorvastatin  40 mg Per Tube QHS   chlorhexidine  15 mL Mouth Rinse BID   feeding supplement (NEPRO CARB STEADY)  330 mL Per Tube TID   free water  200 mL Per Tube Q6H   levothyroxine  75 mcg Per Tube Q0600   mouth rinse  15 mL Mouth Rinse QID   pantoprazole sodium  40 mg Per Tube Daily   phosphorus  250 mg Per Tube Daily   senna-docusate  2 tablet Per Tube QHS   sucralfate  1 g Per Tube Q8H   Continuous Infusions:  PRN Meds: acetaminophen, alum & mag hydroxide-simeth, bisacodyl, diphenhydrAMINE, guaiFENesin-dextromethorphan, polyethylene glycol, prochlorperazine **OR** prochlorperazine **OR** prochlorperazine, sodium phosphate, traZODone  Allergies:    Allergies  Allergen Reactions   Amlodipine Besylate     Other reaction(s): edema   Penicillins Hives and Rash    Social History:   Social History   Socioeconomic History   Marital status: Widowed    Spouse name: Not on file   Number of children: 2   Years of education: Not on file   Highest education level: Not on file   Occupational History   Occupation: retired Actor  Tobacco Use   Smoking status: Never   Smokeless tobacco: Never  Vaping Use   Vaping Use: Never used  Substance and Sexual Activity   Alcohol use: No   Drug use: No   Sexual activity: Never  Other Topics Concern   Not on file  Social History Narrative   Not on file  Social Determinants of Health   Financial Resource Strain: Not on file  Food Insecurity: Not on file  Transportation Needs: Not on file  Physical Activity: Not on file  Stress: Not on file  Social Connections: Not on file  Intimate Partner Violence: Not on file    Family History:    Family History  Problem Relation Age of Onset   Heart disease Mother    Cancer Father        leukemia     ROS:  Please see the history of present illness.  General:no colds or fevers, + weight changes post COVID and  Skin:no rashes or ulcers HEENT:no blurred vision, no congestion CV:see HPI PUL:see HPI GI:no diarrhea constipation or melena, no indigestion GU:no hematuria, no dysuria MS:no joint pain, no claudication Neuro:no syncope, no lightheadedness Endo:no diabetes, no thyroid disease  All other ROS reviewed and negative.     Physical Exam/Data:   Vitals:   03/13/21 1950 03/13/21 1951 03/14/21 0300 03/14/21 0502  BP: (!) 109/56 (!) 109/56 (!) 113/58   Pulse: 74  72   Resp: 18  18   Temp: 98.5 F (36.9 C)  98.6 F (37 C)   TempSrc: Oral  Oral   SpO2: 99%  93%   Weight:    107 kg  Height:        Intake/Output Summary (Last 24 hours) at 03/14/2021 1328 Last data filed at 03/14/2021 1093 Gross per 24 hour  Intake --  Output 2075 ml  Net -2075 ml   Last 3 Weights 03/14/2021 03/13/2021 03/10/2021  Weight (lbs) 235 lb 14.3 oz 235 lb 14.3 oz 160 lb 15.7 oz  Weight (kg) 107 kg 107 kg 73.02 kg     Body mass index is 35.87 kg/m.  General:  frail WM in chair - no complaints HEENT: normal Neck: no JVD Vascular: No carotid bruits; Distal pulses 1+  bilaterally Cardiac:  normal S1, S2; RRR; no murmur gallup rub or click Lungs:  clear to auscultation bilaterally, no wheezing, rhonchi or rales  Abd: soft, nontender, no hepatomegaly  Ext: no edema Musculoskeletal:  No deformities, BUE and BLE strength normal and equal Skin: warm and dry  Neuro:  alert and oriented X 3 MAE follows commands, no focal abnormalities noted Psych:  Normal affect    Relevant CV Studies:   Cardiac cath 09/24/18: PROCEDURE TYPES 1.  CORONARY ANGIOGRAPHY 2.  ULTRASOUND GUIDANCE FOR VASCULAR ACCESS FINAL DIAGNOSIS 1.  Moderate coronary artery atherosclerosis PRE-PROCEDURE DIAGNOSIS 1.  Stenosis Aortic Valve Acquired CORONARY DIAGNOSTIC SUMMARY Coronary artery dominance is right. Normal left main coronary.  The left main coronary artery is 10% obstructed by a discrete lesion.  The proximal left anterior descending artery is 30% obstructed by a discrete lesion. The distal segment is normal size, diseased.  The distal circumflex artery is 50% obstructed by multiple discrete lesions. The distal segment is normal size, diseased.  The middle right coronary artery is 50% obstructed by multiple discrete lesions. The distal segment is normal size, diseased.  The distal right coronary artery is 90% obstructed by a discrete lesion. The distal segment is normal size, diseased.  The right posterior descending artery is 90% obstructed by a discrete lesion. The distal segment is large size.  RADIATION DOSE DATA Procedure cumulative skin dose (mGy): 261.00 Procedure cumulative dose area product (Gy-cm**2): 20.71 Fluoro Time (Min): 9.00 CONTRAST DOSE DATA IOHEXOL 350 MG IODINE/ML INTRAVENOUS SOLUTION: 12mL   Echo 12/03/18: Final Impressions 1. ECHOCARDIOGRAM 2. Goal directed  echocardiogram 30 day s/p TAVR. 3. LEFT VENTRICLE: 4. Normal left ventricular chamber size. 5. Borderline increased concentric left ventricular wall thickness. 6. Calculated 2-D linear left  ventricular ejection fraction 62 %. 7. CARDIAC VALVES: 8. Normal tissue aortic prosthesis. 9. Aortic valve systolic mean Doppler gradient 11 mmHg. 10. Aortic valve peak systolic velocity is, 2.2 m/sec. 11. Trivial aortic valve prosthetic regurgitation. 12. Moderate-severe tricuspid valve regurgitation (no change compared to echo on 08/20/2018). 13. Pacemaker catheter visualized. 14. No pericardial effusion. 15. Compared to the report of 10/29/2018 the following changes have occurred: The prosthetic aortic velocity has increased slightly.   Echo 11/06/19: Final Impressions  1. Normal left ventricular chamber size and wall thickness  2. Calculated 2-D biplane volumetric left ventricular ejection fraction 70 %.  3. Abnormal ventricular septal motion due to pacing.  4. Normal right ventricular chamber size and systolic function  5. Estimated right ventricular systolic pressure 57 mmHg (systolic blood pressure 694 mmHg).  6. Status post 26 mm Edwards Sapien transcatheter aortic valve bioprosthesis mean gradient 12  mmHg, EOA 1.61 cm2 with trivial periprosthetic regurgitation  7. Mild-moderate mitral valve regurgitation.  8. No pericardial effusion.  9. Compared to the report of 12/03/2018 no significant change has occurred. Side by side  comparison of images performed.      Laboratory Data:  High Sensitivity Troponin:  No results for input(s): TROPONINIHS in the last 720 hours.   Chemistry Recent Labs  Lab 03/11/21 0537 03/13/21 0611 03/14/21 0518  NA 137 135 136  K 5.2* 5.2* 4.8  CL 104 102 102  CO2 25 27 25   GLUCOSE 104* 92 94  BUN 35* 34* 32*  CREATININE 1.40* 1.42* 1.33*  CALCIUM 8.4* 8.3* 8.5*  GFRNONAA 45* 45* 48*  ANIONGAP 8 6 9     Recent Labs  Lab 03/13/21 0611  PROT 5.5*  ALBUMIN 2.4*  AST 35  ALT 30  ALKPHOS 55  BILITOT 0.7   Lipids No results for input(s): CHOL, TRIG, HDL, LABVLDL, LDLCALC, CHOLHDL in the last 168 hours.  Hematology Recent Labs  Lab  03/11/21 0537 03/13/21 0611 03/14/21 0518  WBC 10.6* 10.0 10.5  RBC 3.22* 2.88* 2.83*  HGB 10.2* 8.9* 8.9*  HCT 31.0* 28.0* 27.3*  MCV 96.3 97.2 96.5  MCH 31.7 30.9 31.4  MCHC 32.9 31.8 32.6  RDW 17.9* 17.2* 16.9*  PLT 101* 123* 131*   Thyroid No results for input(s): TSH, FREET4 in the last 168 hours.  BNPNo results for input(s): BNP, PROBNP in the last 168 hours.  DDimer No results for input(s): DDIMER in the last 168 hours.   Radiology/Studies:  No results found.   Assessment and Plan:   Permanent atrial fib with V pacing./hx of a flutter with a flutter ablation.    Continues in a fib with pacing. CHA2DS2VASc of 2 based on age.  He was on Eliquis 2.5 mg BID.  With black stools may be beneficial to hold eliquis vs having GI weigh in. I will defer to Dr. Martinique Pt's cardiologist here in [place]. Family is concerned if we resume now may have more bleeding.  AS with hx of TAVR last echo stable.   CAD with 90% distal RCA and PDA disease. Treated medically. Asymptomatic Acute blood loss anemia with Hgb 5.9 on admit 03/02/21 and symptomatic.  He has been transfused 3-4 untis of PRBCs. Stool is Heme + today. CKD stage 3a WT loss and failed Barium swallow -oropharyngeal dysphagia status post PEG tube placement in December  2022 in [place]., still NPO but can have ice chips.     Risk Assessment/Risk Scores:          CHA2DS2-VASc Score = 3   This indicates a 3.2% annual risk of stroke. The patient's score is based upon: CHF History: 0 HTN History: 0 Diabetes History: 0 Stroke History: 0 Vascular Disease History: 1 Age Score: 2 Gender Score: 0         For questions or updates, please contact [facility] Please consult [email or web address] for contact info under    Signed, Cecilie Kicks, NP  03/14/2021 1:28 PM

## 2021-03-15 LAB — GLUCOSE, CAPILLARY
Glucose-Capillary: 108 mg/dL — ABNORMAL HIGH (ref 70–99)
Glucose-Capillary: 112 mg/dL — ABNORMAL HIGH (ref 70–99)
Glucose-Capillary: 123 mg/dL — ABNORMAL HIGH (ref 70–99)
Glucose-Capillary: 95 mg/dL (ref 70–99)

## 2021-03-15 MED ORDER — FERROUS SULFATE 220 (44 FE) MG/5ML PO ELIX
220.0000 mg | ORAL_SOLUTION | Freq: Two times a day (BID) | ORAL | Status: DC
  Administered 2021-03-15 – 2021-03-31 (×32): 220 mg
  Filled 2021-03-15 (×34): qty 5

## 2021-03-15 NOTE — Progress Notes (Signed)
Occupational Therapy Session Note  Patient Details  Name: Gregory [name] MRN: [id number] Date of Birth: 05-14-1920  Today's Date: 03/15/2021 OT Individual Time: 1300-1330 OT Individual Time Calculation (min): 30 min    Short Term Goals: Week 1:  OT Short Term Goal 1 (Week 1): Pt will complete sit<stand from toilet with no more than Mod A OT Short Term Goal 2 (Week 1): Pt will complete UB dressing with Mod A OT Short Term Goal 3 (Week 1): Pt will complete 1/3 components of donning pants using AE or adaptive techniques as needed  Skilled Therapeutic Interventions/Progress Updates:    Pt sleeping in reclner upon arrival. Son and daughter present. OT intervention with focus on sit<>stand, standing balance, and functional transfers/amb with RW. Sit<>stand fluctuated from min A to mod A with max verbal cues for technique and BLE placement. Sit<>stand X 5. Transfers with min A X 5. Pt amb in gym 25' x2. Pt fatigues easily and requires rest breaks between tasks. Pt returned to room and amb with RW from doorway to recliner with min A. Pt remained in recliner. Son and daughter present. Seat alarm activated.   Therapy Documentation Precautions:  Precautions Precautions: Fall Precaution Comments: NPO with PEG tube, posterior lean Restrictions Weight Bearing Restrictions: No  Pain: Pain Assessment Pain Scale: 0-10 Pain Score: 0-No pain Faces Pain Scale: No hurt  Therapy/Group: Individual Therapy  Leroy Libman 03/15/2021, 2:33 PM

## 2021-03-15 NOTE — Progress Notes (Signed)
Physical Therapy Session Note  Patient Details  Name: Gregory [name] MRN: [id number] Date of Birth: [birth year]  Today's Date: 03/15/2021 PT Individual Time: 0830-0900 PT Individual Time Calculation (min): 30 min   Short Term Goals: Week 1:  PT Short Term Goal 1 (Week 1): Pt will perform supine<>sit with min assist PT Short Term Goal 2 (Week 1): Pt will perform sit<>stand transfers using LRAD with min assist PT Short Term Goal 3 (Week 1): Pt will perform stand pivot transfers using LRAD with min assist PT Short Term Goal 4 (Week 1): Pt will ambulate at least 168f using LRAD with min assist PT Short Term Goal 5 (Week 1): Pt will navigate 4 stairs using HRs with min assist  Skilled Therapeutic Interventions/Progress Updates: Pt presented in bed agreeable to therapy. Pt denies pain but increased fatigue due to difficulty sleeping last night. Nsg present in room administering meds. Pt missed 30 min skilled PT due to nsg. Once completed pt performed bed mobility with use of bed features with CGA and increased time. PTA donned slippers total A for time management. Performed Sit to stand with CGA from elevated bed and ambulated to ortho gym CGA. Pt participated in Sit to stand from mat with PTA placing wedge at heels to increase anterior weight shifting on forefeet. Pt then participated in toe taps to yoga blocks for hip flexor strengthening, and coordination. Pt did require increased time between bouts due to fatigue this session. Pt lastaly participated in ball taps with 2lb dowel for sitting balance and UE activity 2 x 10. After seated rest pt ambulated back to room and agreeable to sit in recliner until next session. Pt left in recliner with seat alarm on, call bell within reach and needs met.      Therapy Documentation Precautions:  Precautions Precautions: Fall Precaution Comments: NPO with PEG tube, posterior lean Restrictions Weight Bearing Restrictions: No General: PT Amount of Missed Time  (min): 30 Minutes PT Missed Treatment Reason: Nursing care Vital Signs:  Pain:   Mobility:   Locomotion :    Trunk/Postural Assessment :    Balance:   Exercises:   Other Treatments:      Therapy/Group: Individual Therapy  Gregory [name] 03/15/2021, 12:56 PM

## 2021-03-15 NOTE — Progress Notes (Signed)
Orthopedic Tech Progress Note Patient Details:  Gregory [name] 1920-06-09 [id number]  RN called requesting a SMALL ABDOMINAL BINDER.   Ortho Devices Type of Ortho Device: Abdominal binder Ortho Device/Splint Location: STOMACH Ortho Device/Splint Interventions: Ordered   Post Interventions Patient Tolerated: Well Instructions Provided: Care of device  [name] 03/15/2021, 9:03 AM

## 2021-03-15 NOTE — Progress Notes (Signed)
Occupational Therapy Session Note  Patient Details  Name: Gregory [name] MRN: [id number] Date of Birth: 05/19/1920  Today's Date: 03/15/2021 OT Individual Time: 0930-1040 OT Individual Time Calculation (min): 70 min    Short Term Goals: Week 1:  OT Short Term Goal 1 (Week 1): Pt will complete sit<stand from toilet with no more than Mod A OT Short Term Goal 2 (Week 1): Pt will complete UB dressing with Mod A OT Short Term Goal 3 (Week 1): Pt will complete 1/3 components of donning pants using AE or adaptive techniques as needed  Skilled Therapeutic Interventions/Progress Updates:    Pt resting in recliner upon arrival with son present. Pt reports he didn't sleep well during the night and "felt weak." Postponed shower until tomorrow and pt in agreement. Pt changed shirt with supervision. OT intervention with focus on sit<>stand, functional transfers, functional amb with RW, and BUE/BLE therex for strengthening. Sit<>stand fluctuated between min A and mod A. Pt erquires max verbal cues for technique and hand/BLE placement. Amb with RW with min A. Transfers with min A. 7 mins on NuStep level 4. BUE therex with 3# weight bar-curls, rowing, and chest presses 3x10. Pt returned to room and transferred to recliner. All needs withn reach and seat alarm activated.   Therapy Documentation Precautions:  Precautions Precautions: Fall Precaution Comments: NPO with PEG tube, posterior lean Restrictions Weight Bearing Restrictions: No  Pain:  Pt denies pain this morning  Therapy/Group: Individual Therapy  Leroy Libman 03/15/2021, 11:57 AM

## 2021-03-15 NOTE — Progress Notes (Signed)
Nutrition Follow-up  DOCUMENTATION CODES:   Severe malnutrition in context of chronic illness  INTERVENTION:  Continue bolus feeds via PEG using Nepro formula at goal volume of 330 ml (11 ounces) given TID.   Free water flushes of 200 ml every 6 hours per tube.    Tube feeding regimen to provide 1782 kcal, 80 grams of protein, and 1523 ml free water.    Plans to change back to previous tube feeding regimen using Osmolite 1.5 cal formula once potassium levels are decreased.   NUTRITION DIAGNOSIS:   Severe Malnutrition related to chronic illness (dysphagia, afib) as evidenced by severe fat depletion, severe muscle depletion.  GOAL:   Patient will meet greater than or equal to 90% of their needs; met with TF  MONITOR:   TF tolerance, Skin, I & O's, Labs, Weight trends  REASON FOR ASSESSMENT:   New TF    ASSESSMENT:   88 year old male with history of A Flutter s/p ablation w/PPM, HTN, neuropathy, lung nodule, TVAR, HOH, dysphagia s/p G tube (placed 02/10/21) who was admitted on 03/02/21 with hypotension, dizziness, near syncope and hgb 5.9. FBOT positive with reports of dark stools as well as reports of Hgb of 7.0 and trauma to GI tract due to NGT per family. CIR recommended due to functional decline.  Pt has been tolerating his tube feeding using Nepro formula. Potassium levels yesterday decreased to WNL. Family inquired if pt can resume his previous home tube feeding formula upon discharge home. Per Pam, PA, plans to monitor potassium levels for a couple of more days to make sure it continues to decrease while on Nepro formula prior to changing back to previous tube feeding formula. RD to monitor and modify orders later in week/early next week once appropriate to change back to previous home tube feeding regimen.   NUTRITION - FOCUSED PHYSICAL EXAM:  Flowsheet Row Most Recent Value  Orbital Region Severe depletion  Upper Arm Region Severe depletion  Thoracic and Lumbar Region  Severe depletion  Buccal Region Severe depletion  Temple Region Severe depletion  Clavicle Bone Region Severe depletion  Clavicle and Acromion Bone Region Severe depletion  Scapular Bone Region Unable to assess  Dorsal Hand Severe depletion  Patellar Region Moderate depletion  Anterior Thigh Region Severe depletion  Posterior Calf Region Severe depletion  Edema (RD Assessment) Mild  Hair Reviewed  Eyes Reviewed  Mouth Reviewed  Skin Reviewed  Nails Reviewed      Labs and medications reviewed.   Diet Order:   Diet Order             Diet NPO time specified Except for: Ice Chips  Diet effective now                   EDUCATION NEEDS:   Not appropriate for education at this time  Skin:  Skin Assessment: Reviewed RN Assessment Skin Integrity Issues:: DTI, Other (Comment) DTI: L heel Other: PI sacrum  Last BM:  1/31  Height:   Ht Readings from Last 1 Encounters:  03/10/21 5' 8" (1.727 m)    Weight:   Wt Readings from Last 1 Encounters:  03/14/21 107 kg   BMI:  Body mass index is 35.87 kg/m.  Estimated Nutritional Needs:   Kcal:  1650-1850  Protein:  85-95 grams  Fluid:  >/= 1.65 L/day  Corrin Parker, MS, RD, LDN RD pager number/after hours weekend pager number on Amion.

## 2021-03-15 NOTE — Progress Notes (Signed)
Physical Therapy Session Note  Patient Details  Name: Gregory [name] MRN: [id number] Date of Birth: [birth year]  Today's Date: 03/15/2021 PT Individual Time: 0045-9977 PT Individual Time Calculation (min): 35 min (make up)   Short Term Goals: Week 1:  PT Short Term Goal 1 (Week 1): Pt will perform supine<>sit with min assist PT Short Term Goal 2 (Week 1): Pt will perform sit<>stand transfers using LRAD with min assist PT Short Term Goal 3 (Week 1): Pt will perform stand pivot transfers using LRAD with min assist PT Short Term Goal 4 (Week 1): Pt will ambulate at least 136f using LRAD with min assist PT Short Term Goal 5 (Week 1): Pt will navigate 4 stairs using HRs with min assist  Skilled Therapeutic Interventions/Progress Updates: Pt presented in recliner with family present agreeable to therapy. Pt denies pain during session. Performed Sit to stand from recliner requiring minA due to lower height. Pt ambulated to ortho gym with CGA and min cues for maintaining close proximity to RW. At high/low mat pt participated in step through's in standing with hula hoop for increasing step height and length. Pt was unable to clear hoop but was able to increase step length to consistently step on object. Pt also participated in shoulder flexion to 90 and lateral turns with hoop 2 x 10 with PTA noting that pt did not demonstrate any posterior lean when performing activity. Participated in [place] cuff kicking beach ball x 20 bilaterally for BLE strengthening and conditioning. After brief rest pt ambulated back to room in same manner as prior and returned to recliner as next SLP session in 15 min. Pt's dgt inquired about HH therapies and caregiver resources. Advised [name] LSW would be able to provide more information regarding matter and also that LSW will assist with setting up HHPT/OT. Answered dgt's questions and left pt in recliner with seat alarm on, call bell within reach and needs met.       Therapy Documentation Precautions:  Precautions Precautions: Fall Precaution Comments: NPO with PEG tube, posterior lean Restrictions Weight Bearing Restrictions: No General: PT Amount of Missed Time (min): 30 Minutes PT Missed Treatment Reason: Nursing care Vital Signs: Therapy Vitals Temp: 97.6 F (36.4 C) Temp Source: Oral Pulse Rate: 71 Resp: 19 BP: (!) 117/54 Patient Position (if appropriate): Sitting Oxygen Therapy SpO2: 99 % O2 Device: Room Air Pain: Pain Assessment Pain Scale: 0-10 Pain Score: 0-No pain Faces Pain Scale: No hurt Mobility:   Locomotion :    Trunk/Postural Assessment :    Balance:   Exercises:   Other Treatments:      Therapy/Group: Individual Therapy  Nolyn Eilert 03/15/2021, 3:53 PM

## 2021-03-15 NOTE — Progress Notes (Signed)
Speech Language Pathology Daily Session Note  Patient Details  Name: Gregory [name] MRN: [id number] Date of Birth: May 15, 1920  Today's Date: 03/15/2021 SLP Individual Time: 1415-1500 SLP Individual Time Calculation (min): 45 min  Short Term Goals: Week 1: SLP Short Term Goal 1 (Week 1): Patient will demonstrate functional problem solving for basic, novel tasks (using a RW, etc)  with Min verbal cues. SLP Short Term Goal 2 (Week 1): Patient will recall the steps for basic, novel tasks (using a RW, etc)  with Mod verbal cues. SLP Short Term Goal 3 (Week 1): Patient will participate in RMT exercises to improve vocal and cough strength with Mod verbal cues for accuracy and a self-perceived effort level of 7/10. SLP Short Term Goal 4 (Week 1): Pt will participate in instrumental swallow assessment to determine swallow function, least restrictive diet and risk of aspiration.  Skilled Therapeutic Interventions:   Pt was sitting in recliner upon SLP entry with his daughter. He was seen for skilled ST services with focus on dysphagia. SLP completed thorough oral care with tooth brush and suction. Pt was able to complete oral care with minA. SLP provided pt with ice chips x5 with cues for multiple swallows in between. No overt s/sx of aspiration noted with ice chips (ie. Shortness of breath, wet vocal quality, throat clear, or coughing). SLP edu pt on chin tuck against resistance (CTAR) using towel. Pt required mod verbal and visual cueing intially; however, at the end of session pt was able to complete independently. Pt tucked chin against rolled towel for 3x10 with a 5-second hold. SLP reinforced to daughter that pt is only to have ice chips with SLP at this time. Pt was left in recliner with daughter and all immediate needs in reach.     Pain Pain Assessment Pain Scale: 0-10 Pain Score: 0-No pain Faces Pain Scale: No hurt  Therapy/Group: Individual Therapy  Verdene Lennert 03/15/2021, 4:04 PM

## 2021-03-15 NOTE — Progress Notes (Signed)
PROGRESS NOTE   Subjective/Complaints:  Pt reports LBM 2 days ago- not since saw him last.  Doesn't remember GI/Cards coming by and discussing Eliquis. And stopping it.  Also GI came by- will maximize PPI and add iron solution since fed via PEG.   Family also decided against EGD and go for medical mgmt.      ROS:   Pt denies SOB, abd pain, CP, N/V/C/D, and vision changes  Objective:   No results found. Recent Labs    03/13/21 0611 03/14/21 0518  WBC 10.0 10.5  HGB 8.9* 8.9*  HCT 28.0* 27.3*  PLT 123* 131*   Recent Labs    03/13/21 0611 03/14/21 0518  NA 135 136  K 5.2* 4.8  CL 102 102  CO2 27 25  GLUCOSE 92 94  BUN 34* 32*  CREATININE 1.42* 1.33*  CALCIUM 8.3* 8.5*    Intake/Output Summary (Last 24 hours) at 03/15/2021 0843 Last data filed at 03/15/2021 0700 Gross per 24 hour  Intake --  Output 2020 ml  Net -2020 ml     Pressure Injury 03/03/21 Heel Left Deep Tissue Pressure Injury - Purple or maroon localized area of discolored intact skin or blood-filled blister due to damage of underlying soft tissue from pressure and/or shear. (Active)  03/03/21 1114  Location: Heel  Location Orientation: Left  Staging: Deep Tissue Pressure Injury - Purple or maroon localized area of discolored intact skin or blood-filled blister due to damage of underlying soft tissue from pressure and/or shear.  Wound Description (Comments):   Present on Admission: Yes    Physical Exam: Vital Signs Blood pressure (!) 124/59, pulse 69, temperature 98.3 F (36.8 C), resp. rate 14, height 5\' 8"  (1.727 m), weight 107 kg, SpO2 94 %.       General: awake, alert, appropriate, sleeping initially, but woke to stimuli; very HOH; NAD HENT: conjugate gaze; oropharynx moist CV: regular rate; no JVD Pulmonary: CTA B/L; no W/R/R- good air movement GI: soft, NT, ND, (+)BS- (+) PEG in place Psychiatric: appropriate Neurological:  decreased memory- can't remember GI/Cards yesterday- very HOH- not wearing hearing aids Ext: no clubbing, cyanosis, or edema Psych: pleasant and cooperative  Psych: Pt's affect is appropriate. Pt is cooperative Skin: foot/heel wounds- DTI-s covered with dressings-foam- C/D/I Neuro:  HOH but able comprehend when he's able to hear. No focal language issues. Mild dysarthria. Strength grossly 4/5 bilateral UE/LE's. No focal sensory abnl. No abnl tone.  Musculoskeletal: Full ROM, No pain with AROM or PROM in the neck, trunk, or extremities. Posture appropriate     Assessment/Plan: 1. Functional deficits which require 3+ hours per day of interdisciplinary therapy in a comprehensive inpatient rehab setting. Physiatrist is providing close team supervision and 24 hour management of active medical problems listed below. Physiatrist and rehab team continue to assess barriers to discharge/monitor patient progress toward functional and medical goals  Care Tool:  Bathing    Body parts bathed by patient: Right arm   Body parts bathed by helper: Buttocks     Bathing assist Assist Level: Maximal Assistance - Patient 24 - 49%     Upper Body Dressing/Undressing Upper body dressing   What  is the patient wearing?: Hospital gown only    Upper body assist Assist Level: Moderate Assistance - Patient 50 - 74%    Lower Body Dressing/Undressing Lower body dressing      What is the patient wearing?: Incontinence brief     Lower body assist Assist for lower body dressing: Moderate Assistance - Patient 50 - 74%     Toileting Toileting    Toileting assist Assist for toileting: Moderate Assistance - Patient 50 - 74%     Transfers Chair/bed transfer  Transfers assist     Chair/bed transfer assist level: Minimal Assistance - Patient > 75%     Locomotion Ambulation   Ambulation assist   Ambulation activity did not occur: Safety/medical concerns  Assist level: Contact Guard/Touching  assist Assistive device: Walker-rolling Max distance: 150'   Walk 10 feet activity   Assist  Walk 10 feet activity did not occur: Safety/medical concerns  Assist level: Contact Guard/Touching assist Assistive device: Walker-rolling   Walk 50 feet activity   Assist Walk 50 feet with 2 turns activity did not occur: Safety/medical concerns  Assist level: Contact Guard/Touching assist Assistive device: Walker-rolling    Walk 150 feet activity   Assist Walk 150 feet activity did not occur: Safety/medical concerns  Assist level: Contact Guard/Touching assist Assistive device: Walker-rolling    Walk 10 feet on uneven surface  activity   Assist Walk 10 feet on uneven surfaces activity did not occur: Safety/medical concerns         Wheelchair     Assist Is the patient using a wheelchair?: Yes Type of Wheelchair: Manual    Wheelchair assist level: Dependent - Patient 0%      Wheelchair 50 feet with 2 turns activity    Assist        Assist Level: Dependent - Patient 0%   Wheelchair 150 feet activity     Assist      Assist Level: Dependent - Patient 0%   Blood pressure (!) 124/59, pulse 69, temperature 98.3 F (36.8 C), resp. rate 14, height 5\' 8"  (1.727 m), weight 107 kg, SpO2 94 %.  Medical Problem List and Plan: 1. Functional deficits secondary to debility             -patient may shower but PEG must be covered.              -ELOS/Goals: 2-3 weeks S-MinA             -d/c date 03/24/21- Continue CIR- PT, OT and SLP  2.  Antithrombotics: -DVT/anticoagulation:  Mechanical: Sequential compression devices, below knee Bilateral lower extremities 1/31- although Hb is stable, pt still having black tarry stools- will consult Cards about Eliquis- I think it's concerning to restart right now, but will let Cards decide.  2/1- Cards and GI agree NO ELIQUIS or Blood thinners.              -antiplatelet therapy: N/A 3. Pain Management: Tylenol 4.  Mood:  LCSW to follow              -antipsychotic agents: N/A 5. Neuropsych: This patient is capable of making decisions on his own behalf. 6. Skin/Wound Care: pressure relief, protection to feet/heels 7. Fluids/Electrolytes/Nutrition: continue TF  -hyperkalemia: lokelma 5mg  daily x 2 doses, recheck labs Monday  2/1- see below under hyperkalemia 8. A flutter s/p TAVR 2020: plan is to hold eliquis for now and resume in the next few weeks potentially --HR controlled 1/29 1/30- hold  eliquis- likely still bleeding- will call Cards in AM to see their plan.  9. Severe dysphagia w/aspiration: NPO with tube feeds for nutritonal support.  10. GIB: Monitor for signs of bleeding. Continue Protonix and Carafate. Holding Eliquis.  1/30- Hb 8.9- was 10.2- but previous was 8.9 so likely not bleeding severely, but will recheck labs in AM 1/31- Hb stable at 8.9- will monitor 2/1- GI maximized PPI to BID and maintained carafate- will also add FeSol solution BID for low iron levels 11. CKD 3a: BUN/SCr 124/2.07             --improved to 35/1.4 on 1/28  1/30- Cr 1.42 and BUN 34- overall stable- con't to monitor  -monitor output, labs Monday   1/31- Cr down slightly to 1.33 and BUN 32- con't to monitor  2/1- wil recheck in AM 12. Constipation: Continue Senna daily.  13. HLD: continue atorvastatin 40mg  daily.  14. Hypothyroidism: continue Synthroid.  15. Hyperkalemia- likely due to TfF'- has PEG.   1/31- given multiple doses of Lokelma- down to 4.8- but will recheck again Thursday.  16. NPO-due to esophageal stricture/severe dysphagia on TF's  1/31- 1 reason came into hospital with failure to thrive due to not eating/drinking- per chart review- SLP involved, but not sure if will be able to not use PEG.  17. ABLA  2/1- will add Feosol solution BID to help with blood loss.     I spent a total of  35  minutes on total care today- >50% coordination of care- due to  d/w GI and Cards     LOS: 5 days A FACE  TO FACE EVALUATION WAS PERFORMED  Gregory [name] 03/15/2021, 8:43 AM

## 2021-03-15 NOTE — Progress Notes (Signed)
Patient ID: Gregory [name], male   DOB: 04/07/1920, 88 y.o.   MRN: [id number]  SW returned phone call to pt dtr [name] to discuss phone call from [facility], and provide updates from team conference. Reports she is not aware of [facility]. SW provided contact information for liasion to make contact. SW confirms hospital bed received at home. SW informed will confirm all of patient d/c needs once closer towards d/c.   Per EMR, HHPT/OT/SN referral with [facility]. SW confirmed with [name]/[facility] is following patient. SW sent order for HHPT/OT/SLP/SN/aide.   SW spoke with [name] with [facility] [phone])  to inform on family provided contact information to touch base about services. Will f/u if needed.   Sw received phone call from pt son [name] who was inquiring about [facility]. SW provided contact information if he would like to pursue.   [name], MSW, [place] Office: [phone] Cell: [phone] Fax: [phone]

## 2021-03-16 ENCOUNTER — Inpatient Hospital Stay (HOSPITAL_COMMUNITY): Payer: Medicare Other

## 2021-03-16 LAB — CBC WITH DIFFERENTIAL/PLATELET
Abs Immature Granulocytes: 0.05 10*3/uL (ref 0.00–0.07)
Basophils Absolute: 0.1 10*3/uL (ref 0.0–0.1)
Basophils Relative: 1 %
Eosinophils Absolute: 0.2 10*3/uL (ref 0.0–0.5)
Eosinophils Relative: 2 %
HCT: 27.2 % — ABNORMAL LOW (ref 39.0–52.0)
Hemoglobin: 8.8 g/dL — ABNORMAL LOW (ref 13.0–17.0)
Immature Granulocytes: 1 %
Lymphocytes Relative: 11 %
Lymphs Abs: 1.2 10*3/uL (ref 0.7–4.0)
MCH: 31 pg (ref 26.0–34.0)
MCHC: 32.4 g/dL (ref 30.0–36.0)
MCV: 95.8 fL (ref 80.0–100.0)
Monocytes Absolute: 0.9 10*3/uL (ref 0.1–1.0)
Monocytes Relative: 9 %
Neutro Abs: 8.6 10*3/uL — ABNORMAL HIGH (ref 1.7–7.7)
Neutrophils Relative %: 76 %
Platelets: 151 10*3/uL (ref 150–400)
RBC: 2.84 MIL/uL — ABNORMAL LOW (ref 4.22–5.81)
RDW: 16.6 % — ABNORMAL HIGH (ref 11.5–15.5)
WBC: 11.1 10*3/uL — ABNORMAL HIGH (ref 4.0–10.5)
nRBC: 0 % (ref 0.0–0.2)

## 2021-03-16 LAB — URINALYSIS, ROUTINE W REFLEX MICROSCOPIC

## 2021-03-16 LAB — BASIC METABOLIC PANEL
Anion gap: 9 (ref 5–15)
BUN: 30 mg/dL — ABNORMAL HIGH (ref 8–23)
CO2: 27 mmol/L (ref 22–32)
Calcium: 9 mg/dL (ref 8.9–10.3)
Chloride: 98 mmol/L (ref 98–111)
Creatinine, Ser: 1.26 mg/dL — ABNORMAL HIGH (ref 0.61–1.24)
GFR, Estimated: 52 mL/min — ABNORMAL LOW (ref >60)
Glucose, Bld: 106 mg/dL — ABNORMAL HIGH (ref 70–99)
Potassium: 4.2 mmol/L (ref 3.5–5.1)
Sodium: 134 mmol/L — ABNORMAL LOW (ref 135–145)

## 2021-03-16 LAB — URINALYSIS, MICROSCOPIC (REFLEX)
WBC, UA: >50 WBC/hpf (ref 0–5)
WBC, UA: >50 WBC/hpf (ref 0–5)

## 2021-03-16 LAB — GLUCOSE, CAPILLARY
Glucose-Capillary: 102 mg/dL — ABNORMAL HIGH (ref 70–99)
Glucose-Capillary: 104 mg/dL — ABNORMAL HIGH (ref 70–99)
Glucose-Capillary: 118 mg/dL — ABNORMAL HIGH (ref 70–99)
Glucose-Capillary: 90 mg/dL (ref 70–99)

## 2021-03-16 NOTE — Progress Notes (Signed)
PROGRESS NOTE   Subjective/Complaints:  Pt reports/nursing reports LBM last night- was brown, not black- Hb stable at 8.8.   Just woke up- feels good overall- denies issues. Even though WBC slightly elevated at 11.1, afebrile and no UTI/URI Sx's.    ROS:   Pt denies SOB, abd pain, CP, N/V/C/D, and vision changes  Objective:   No results found. Recent Labs    03/14/21 0518 03/16/21 0531  WBC 10.5 11.1*  HGB 8.9* 8.8*  HCT 27.3* 27.2*  PLT 131* 151   Recent Labs    03/14/21 0518 03/16/21 0531  NA 136 134*  K 4.8 4.2  CL 102 98  CO2 25 27  GLUCOSE 94 106*  BUN 32* 30*  CREATININE 1.33* 1.26*  CALCIUM 8.5* 9.0    Intake/Output Summary (Last 24 hours) at 03/16/2021 3887 Last data filed at 03/16/2021 0631 Gross per 24 hour  Intake --  Output 1725 ml  Net -1725 ml     Pressure Injury 03/03/21 Heel Left Deep Tissue Pressure Injury - Purple or maroon localized area of discolored intact skin or blood-filled blister due to damage of underlying soft tissue from pressure and/or shear. (Active)  03/03/21 1114  Location: Heel  Location Orientation: Left  Staging: Deep Tissue Pressure Injury - Purple or maroon localized area of discolored intact skin or blood-filled blister due to damage of underlying soft tissue from pressure and/or shear.  Wound Description (Comments):   Present on Admission: Yes    Physical Exam: Vital Signs Blood pressure (!) 141/58, pulse 74, temperature 98.1 F (36.7 C), temperature source Oral, resp. rate 18, height 5\' 8"  (1.727 m), weight 106.8 kg, SpO2 93 %.        General: awake, alert, appropriate, supine in bed; nursing giving TF's.  NAD HENT: conjugate gaze; oropharynx moist CV: regular rate; no JVD Pulmonary: CTA B/L; no W/R/R- good air movement- sounds good GI: soft, NT, ND, (+)BS- (+) PEG_ no signs of infection Psychiatric: appropriate but flat Neurological: alert- just woke  up- stable decreased memory- HOH- not wearing hearing aids yet Ext: no clubbing, cyanosis, or edema Skin: foot/heel wounds- DTI-s covered with dressings-foam- C/D/I Neuro:  HOH but able comprehend when he's able to hear. No focal language issues. Mild dysarthria. Strength grossly 4/5 bilateral UE/LE's. No focal sensory abnl. No abnl tone.  Musculoskeletal: Full ROM, No pain with AROM or PROM in the neck, trunk, or extremities. Posture appropriate     Assessment/Plan: 1. Functional deficits which require 3+ hours per day of interdisciplinary therapy in a comprehensive inpatient rehab setting. Physiatrist is providing close team supervision and 24 hour management of active medical problems listed below. Physiatrist and rehab team continue to assess barriers to discharge/monitor patient progress toward functional and medical goals  Care Tool:  Bathing    Body parts bathed by patient: Right arm   Body parts bathed by helper: Buttocks     Bathing assist Assist Level: Maximal Assistance - Patient 24 - 49%     Upper Body Dressing/Undressing Upper body dressing   What is the patient wearing?: Hospital gown only    Upper body assist Assist Level: Moderate Assistance - Patient 50 -  74%    Lower Body Dressing/Undressing Lower body dressing      What is the patient wearing?: Incontinence brief     Lower body assist Assist for lower body dressing: Moderate Assistance - Patient 50 - 74%     Toileting Toileting    Toileting assist Assist for toileting: Moderate Assistance - Patient 50 - 74%     Transfers Chair/bed transfer  Transfers assist     Chair/bed transfer assist level: Minimal Assistance - Patient > 75%     Locomotion Ambulation   Ambulation assist   Ambulation activity did not occur: Safety/medical concerns  Assist level: Contact Guard/Touching assist Assistive device: Walker-rolling Max distance: 150'   Walk 10 feet activity   Assist  Walk 10 feet  activity did not occur: Safety/medical concerns  Assist level: Contact Guard/Touching assist Assistive device: Walker-rolling   Walk 50 feet activity   Assist Walk 50 feet with 2 turns activity did not occur: Safety/medical concerns  Assist level: Contact Guard/Touching assist Assistive device: Walker-rolling    Walk 150 feet activity   Assist Walk 150 feet activity did not occur: Safety/medical concerns  Assist level: Contact Guard/Touching assist Assistive device: Walker-rolling    Walk 10 feet on uneven surface  activity   Assist Walk 10 feet on uneven surfaces activity did not occur: Safety/medical concerns         Wheelchair     Assist Is the patient using a wheelchair?: Yes Type of Wheelchair: Manual    Wheelchair assist level: Dependent - Patient 0%      Wheelchair 50 feet with 2 turns activity    Assist        Assist Level: Dependent - Patient 0%   Wheelchair 150 feet activity     Assist      Assist Level: Dependent - Patient 0%   Blood pressure (!) 141/58, pulse 74, temperature 98.1 F (36.7 C), temperature source Oral, resp. rate 18, height 5\' 8"  (1.727 m), weight 106.8 kg, SpO2 93 %.  Medical Problem List and Plan: 1. Functional deficits secondary to debility             -patient may shower but PEG must be covered.              -ELOS/Goals: 2-3 weeks S-MinA             -d/c date 03/24/21- 2/2- Continue CIR- PT, OT and SLP 2.  Antithrombotics: -DVT/anticoagulation:  Mechanical: Sequential compression devices, below knee Bilateral lower extremities 1/31- although Hb is stable, pt still having black tarry stools- will consult Cards about Eliquis- I think it's concerning to restart right now, but will let Cards decide.  - Cards and GI agree NO ELIQUIS or Blood thinners.              -antiplatelet therapy: N/A 3. Pain Management: Tylenol 4. Mood:  LCSW to follow              -antipsychotic agents: N/A 5. Neuropsych: This  patient is capable of making decisions on his own behalf. 6. Skin/Wound Care: pressure relief, protection to feet/heels 7. Fluids/Electrolytes/Nutrition: continue TF  -hyperkalemia: lokelma 5mg  daily x 2 doses, recheck labs Monday  2/1- see below under hyperkalemia 8. A flutter s/p TAVR 2020: plan is to hold eliquis for now and resume in the next few weeks potentially --HR controlled 1/29 1/30- hold eliquis- likely still bleeding- will call Cards in AM to see their plan.  9. Severe dysphagia w/aspiration:  NPO with tube feeds for nutritonal support.  10. GIB: Monitor for signs of bleeding. Continue Protonix and Carafate. Holding Eliquis.  1/30- Hb 8.9- was 10.2- but previous was 8.9 so likely not bleeding severely, but will recheck labs in AM 1/31- Hb stable at 8.9- will monitor 2/1- GI maximized PPI to BID and maintained carafate- will also add FeSol solution BID for low iron levels 11. CKD 3a: BUN/SCr 124/2.07             --improved to 35/1.4 on 1/28  1/30- Cr 1.42 and BUN 34- overall stable- con't to monitor  -monitor output, labs Monday   1/31- Cr down slightly to 1.33 and BUN 32- con't to monitor  2/1- wil recheck in AM  2/2- Cr 1.26 and BUN down to 30- from 36- doing better- con't to monitor 12. Constipation: Continue Senna daily. 2/2- LBM yesterday- is brown  13. HLD: continue atorvastatin 40mg  daily.  14. Hypothyroidism: continue Synthroid.  15. Hyperkalemia- likely due to TfF'- has PEG.   1/31- given multiple doses of Lokelma- down to 4.8- but will recheck again Thursday. 2/2- K+ 4.2- so doing much better on Nepro TF's  16. NPO-due to esophageal stricture/severe dysphagia on TF's  1/31- 1 reason came into hospital with failure to thrive due to not eating/drinking- per chart review- SLP involved, but not sure if will be able to not use PEG.   2/2- failed MBS - cannot have anything by mouth 17. ABLA  2/1- will add Feosol solution BID to help with blood loss.  18.  Leukocytosis  2/2- Mild- WBC 11.1- will check CXR since is NPO and U/A and Cx to try and find source and recheck in AM  I spent a total of 41   minutes on total care today- >50% coordination of care- due to ID w/u and d/w nursing.     LOS: 6 days A FACE TO FACE EVALUATION WAS PERFORMED  Gregory [name] 03/16/2021, 9:03 AM

## 2021-03-16 NOTE — Progress Notes (Signed)
Physical Therapy Session Note  Patient Details  Name: Gregory [name] MRN: [id number] Date of Birth: 1920-10-24  Today's Date: 03/16/2021 PT Individual Time: 9774-1423 PT Individual Time Calculation (min): 40 min   Short Term Goals: Week 1:  PT Short Term Goal 1 (Week 1): Pt will perform supine<>sit with min assist PT Short Term Goal 2 (Week 1): Pt will perform sit<>stand transfers using LRAD with min assist PT Short Term Goal 3 (Week 1): Pt will perform stand pivot transfers using LRAD with min assist PT Short Term Goal 4 (Week 1): Pt will ambulate at least 139f using LRAD with min assist PT Short Term Goal 5 (Week 1): Pt will navigate 4 stairs using HRs with min assist  Skilled Therapeutic Interventions/Progress Updates: Pt presented in recliner sleeping but easily aroused and agreeable to therapy. Pt denies pain initially during session but c/o PEG tube insertion soreness towards end of session (PTA removed binder at end of session when in bed and per pt felt relief). Pt required minA for Sit to stand from recliner and x 2 attempts but CGA for remainder of session. Pt ambulated to rehab gym CGA with RW >1032f Pt then participated in rebounder with 1Kg weighted ball 2 x 15 in seated position for general conditioning. Pt also participated in several bouts of corn hole in standing with RW with pt performing mod reaching outside BOS with CGA. After rest pt ambulated back to room in same manner as prior and transferred to bed with supervision and increased effort with use of bed rail only to flat bed. Pt left in bed at end of session with bed alarm on, call bell within reach and needs met.      Therapy Documentation Precautions:  Precautions Precautions: Fall Precaution Comments: NPO with PEG tube, posterior lean Restrictions Weight Bearing Restrictions: No General:   Vital Signs: Therapy Vitals Temp: 97.6 F (36.4 C) Pulse Rate: 70 Resp: 20 BP: 108/69 Patient Position (if  appropriate): Lying Oxygen Therapy SpO2: 97 % O2 Device: Room Air Pain:   Mobility:   Locomotion :    Trunk/Postural Assessment :    Balance:   Exercises:   Other Treatments:      Therapy/Group: Individual Therapy  Shaquille Murdy 03/16/2021, 4:08 PM

## 2021-03-16 NOTE — Progress Notes (Signed)
Physical Therapy Session Note  Patient Details  Name: Gregory [name] MRN: [id number] Date of Birth: December 03, 1920  Today's Date: 03/16/2021 PT Individual Time: 0805-0900 PT Individual Time Calculation (min): 55 min   Short Term Goals: Week 1:  PT Short Term Goal 1 (Week 1): Pt will perform supine<>sit with min assist PT Short Term Goal 2 (Week 1): Pt will perform sit<>stand transfers using LRAD with min assist PT Short Term Goal 3 (Week 1): Pt will perform stand pivot transfers using LRAD with min assist PT Short Term Goal 4 (Week 1): Pt will ambulate at least 134ft using LRAD with min assist PT Short Term Goal 5 (Week 1): Pt will navigate 4 stairs using HRs with min assist Week 2:     Skilled Therapeutic Interventions/Progress Updates:   Pt received supine in bed and agreeable to PT. Donning pants in bed with mod assist to manage waist band over feet. Supine>sit transfer with min assist  at trunk and cues for use of bed rail to push into sitting.  Sit<>stand from EOB with min assist and RUE pushing from bed. Stand pivot transfer to [facility] with CGA and RW.   WC mobility x 163ft to force functionsl strengthening of BUEwith intermittent min assist to prevent veer to the R.   Gait training with RW x 142ft with CGA for safety and min cues for safety and AD management in turn to prepare to sit.   PT instructed pt step management to ascend and descend 6 inch step with RW. Perform x 2 with min assist to prevent LOB with descent to floor and moderate cues for safety with RW management.    Pt received sitting in WC and agreeable to PT      Therapy Documentation Precautions:  Precautions Precautions: Fall Precaution Comments: NPO with PEG tube, posterior lean Restrictions Weight Bearing Restrictions: No  Vital Signs: Therapy Vitals Temp: 98.1 F (36.7 C) Temp Source: Oral Pulse Rate: 74 Resp: 18 BP: (!) 141/58 Patient Position (if appropriate): Lying Oxygen Therapy SpO2: 93 % O2  Device: Room Air Pain:  denies    Therapy/Group: Individual Therapy  [name] 03/16/2021, 9:01 AM

## 2021-03-16 NOTE — Progress Notes (Signed)
Occupational Therapy Session Note  Patient Details  Name: Gregory [name] MRN: [id number] Date of Birth: 18-Jul-1920  Today's Date: 03/16/2021 OT Individual Time: 0930-1040 OT Individual Time Calculation (min): 70 min    Short Term Goals: Week 1:  OT Short Term Goal 1 (Week 1): Pt will complete sit<stand from toilet with no more than Mod A OT Short Term Goal 2 (Week 1): Pt will complete UB dressing with Mod A OT Short Term Goal 3 (Week 1): Pt will complete 1/3 components of donning pants using AE or adaptive techniques as needed  Skilled Therapeutic Interventions/Progress Updates:    Pt resting in w/c upon arrival with son present. OT intervention with focus on funcitonal amb with RW, standing balance, bathing at shower level, and dressing with sit<>stand from w/c. Amb with RW to bathroom at [facility]. Pt stood in shower approx 50% of time. Min A for bathing. Pt required assistance washing hair. Pt required assistance threading paper scrubs over BLE but able to pull over hips when standing with CGA. Min A for donning paper pullover shirt. Pt required more then a reasonable amount of time to complete tasks with min verbal cues for transitions. Pt transferred to recliner. Pt remained in recliner with seat alarm activated. Son present and all needs within reach.   Therapy Documentation Precautions:  Precautions Precautions: Fall Precaution Comments: NPO with PEG tube, posterior lean Restrictions Weight Bearing Restrictions: No  Pain: Pain Assessment Pain Scale: 0-10 Pain Score: 0-No pain Faces Pain Scale: No hurt   Therapy/Group: Individual Therapy  Leroy Libman 03/16/2021, 10:51 AM

## 2021-03-16 NOTE — Progress Notes (Signed)
Patient sleeping throughout the day. C/O being tired, and not sleeping through the night. Patient and family educated on only 1 blanket over toes due to pressure on great toes. Patient remains on an air mattress, declines repositioning in bed. Patient worries about pulling or tugging on peg tube. Abdominal binder on as ordered. No C/O pain, no S/S of distress. Personal items within reach, call light within reach. Safety maintained.

## 2021-03-17 LAB — BASIC METABOLIC PANEL
Anion gap: 7 (ref 5–15)
BUN: 31 mg/dL — ABNORMAL HIGH (ref 8–23)
CO2: 27 mmol/L (ref 22–32)
Calcium: 8.9 mg/dL (ref 8.9–10.3)
Chloride: 100 mmol/L (ref 98–111)
Creatinine, Ser: 1.34 mg/dL — ABNORMAL HIGH (ref 0.61–1.24)
GFR, Estimated: 48 mL/min — ABNORMAL LOW (ref >60)
Glucose, Bld: 95 mg/dL (ref 70–99)
Potassium: 3.8 mmol/L (ref 3.5–5.1)
Sodium: 134 mmol/L — ABNORMAL LOW (ref 135–145)

## 2021-03-17 LAB — CBC WITH DIFFERENTIAL/PLATELET
Abs Immature Granulocytes: 0.06 10*3/uL (ref 0.00–0.07)
Basophils Absolute: 0.1 10*3/uL (ref 0.0–0.1)
Basophils Relative: 0 %
Eosinophils Absolute: 0.2 10*3/uL (ref 0.0–0.5)
Eosinophils Relative: 2 %
HCT: 27 % — ABNORMAL LOW (ref 39.0–52.0)
Hemoglobin: 8.6 g/dL — ABNORMAL LOW (ref 13.0–17.0)
Immature Granulocytes: 1 %
Lymphocytes Relative: 12 %
Lymphs Abs: 1.5 10*3/uL (ref 0.7–4.0)
MCH: 30.5 pg (ref 26.0–34.0)
MCHC: 31.9 g/dL (ref 30.0–36.0)
MCV: 95.7 fL (ref 80.0–100.0)
Monocytes Absolute: 1.3 10*3/uL — ABNORMAL HIGH (ref 0.1–1.0)
Monocytes Relative: 10 %
Neutro Abs: 9.7 10*3/uL — ABNORMAL HIGH (ref 1.7–7.7)
Neutrophils Relative %: 75 %
Platelets: 146 10*3/uL — ABNORMAL LOW (ref 150–400)
RBC: 2.82 MIL/uL — ABNORMAL LOW (ref 4.22–5.81)
RDW: 16.9 % — ABNORMAL HIGH (ref 11.5–15.5)
WBC: 12.8 10*3/uL — ABNORMAL HIGH (ref 4.0–10.5)
nRBC: 0 % (ref 0.0–0.2)

## 2021-03-17 LAB — URINALYSIS, ROUTINE W REFLEX MICROSCOPIC

## 2021-03-17 LAB — GLUCOSE, CAPILLARY
Glucose-Capillary: 106 mg/dL — ABNORMAL HIGH (ref 70–99)
Glucose-Capillary: 92 mg/dL (ref 70–99)
Glucose-Capillary: 92 mg/dL (ref 70–99)
Glucose-Capillary: 97 mg/dL (ref 70–99)

## 2021-03-17 LAB — URINALYSIS, MICROSCOPIC (REFLEX)
RBC / HPF: NONE SEEN RBC/hpf (ref 0–5)
Squamous Epithelial / HPF: NONE SEEN (ref 0–5)
WBC, UA: >50 WBC/hpf (ref 0–5)

## 2021-03-17 MED ORDER — ATORVASTATIN CALCIUM 40 MG PO TABS: 40.0000 mg | ORAL_TABLET | Freq: Every day | ORAL | Status: DC

## 2021-03-17 MED ORDER — GUAIFENESIN-DM 100-10 MG/5ML PO SYRP
5.0000 mL | ORAL_SOLUTION | Freq: Four times a day (QID) | ORAL | Status: DC | PRN
  Administered 2021-03-20 – 2021-03-31 (×3): 10 mL
  Filled 2021-03-17 (×4): qty 10

## 2021-03-17 MED ORDER — ACETAMINOPHEN 325 MG PO TABS: 325.0000 mg | ORAL_TABLET | ORAL | Status: DC | PRN

## 2021-03-17 MED ORDER — ALUM & MAG HYDROXIDE-SIMETH 200-200-20 MG/5ML PO SUSP: 30.0000 mL | ORAL | Status: DC | PRN

## 2021-03-17 MED ORDER — SENNA 8.6 MG PO TABS: 2.0000 | ORAL_TABLET | Freq: Every day | ORAL | 0 refills | Status: DC

## 2021-03-17 MED ORDER — CEPHALEXIN 250 MG PO CAPS
500.0000 mg | ORAL_CAPSULE | Freq: Two times a day (BID) | ORAL | Status: DC
  Administered 2021-03-17: 500 mg via ORAL

## 2021-03-17 MED ORDER — CEPHALEXIN 250 MG PO CAPS
500.0000 mg | ORAL_CAPSULE | Freq: Two times a day (BID) | ORAL | Status: DC
Start: 2021-03-17 — End: 2021-03-20
  Administered 2021-03-17 – 2021-03-20 (×6): 500 mg
  Filled 2021-03-17 (×8): qty 2

## 2021-03-17 MED ORDER — LEVOTHYROXINE SODIUM 75 MCG PO TABS: 75.0000 ug | ORAL_TABLET | Freq: Every day | ORAL | Status: DC

## 2021-03-17 MED ORDER — POLYETHYLENE GLYCOL 3350 17 G PO PACK
17.0000 g | PACK | Freq: Once | ORAL | Status: AC
  Administered 2021-03-17: 17 g
  Filled 2021-03-17: qty 1

## 2021-03-17 NOTE — Progress Notes (Signed)
Occupational Therapy Weekly Progress Note  Patient Details  Name: Gregory [name] MRN: [id number] Date of Birth: Dec 11, 1920  Beginning of progress report period: March 11, 2021 End of progress report period: March 17, 2021  Patient has met 3 of 3 short term goals.  Pt making steady progress with BADLs and functional transfers. Pt fatigues quickly and requires multiple rest breaks during therapy sessions. Pt requires min A for bathing at shower level. UB dressing with supervision/min A. LB dressing with min A. Functional transfers with CGA/min A.   Patient continues to demonstrate the following deficits: muscle weakness, decreased cardiorespiratoy endurance, and decreased standing balance, decreased balance strategies, and difficulty maintaining precautions and therefore will continue to benefit from skilled OT intervention to enhance overall performance with BADL and Reduce care partner burden.  Patient progressing toward long term goals..  Continue plan of care.  OT Short Term Goals Week 1:  OT Short Term Goal 1 (Week 1): Pt will complete sit<stand from toilet with no more than Mod A OT Short Term Goal 1 - Progress (Week 1): Met OT Short Term Goal 2 (Week 1): Pt will complete UB dressing with Mod A OT Short Term Goal 2 - Progress (Week 1): Met OT Short Term Goal 3 (Week 1): Pt will complete 1/3 components of donning pants using AE or adaptive techniques as needed OT Short Term Goal 3 - Progress (Week 1): Met Week 2:  OT Short Term Goal 1 (Week 2): STG=LTG 2/2 ELOS (continued working to supervision goals)   Leroy Libman 03/17/2021, 12:45 PM

## 2021-03-17 NOTE — Progress Notes (Signed)
PROGRESS NOTE   Subjective/Complaints:  Gregory [name] reports no UTI Sx's or URI Sx's- doesn't feel sick at all.  Per nursing, they sent his U/A yesterday x2- however was "too turbid" to run U/A- has many bacteria.   No Sx's, but WBC up to 12.8-  Will treat - discussed with pharmacy- will start Keflex- also ok with very low risk of rash with cephalosporins.      ROS:   Gregory [name] denies SOB, abd pain, CP, N/V/C/D, and vision changes    Objective:   DG CHEST PORT 1 VIEW  Result Date: 03/16/2021 CLINICAL DATA:  Leukocytosis.  History of hypertension. EXAM: PORTABLE CHEST 1 VIEW COMPARISON:  03/02/2021 FINDINGS: Left chest wall pacer device identified with leads in the right atrial appendage and right ventricle. Status post TAVR. Aortic atherosclerosis. Stable cardiomediastinal contours. Small pleural effusions are suspected. No airspace consolidation or interstitial edema. No acute osseous findings. IMPRESSION: Suspect small bilateral pleural effusions. Electronically Signed   By: Kerby Moors M.D.   On: 03/16/2021 09:21   Recent Labs    03/16/21 0531 03/17/21 0454  WBC 11.1* 12.8*  HGB 8.8* 8.6*  HCT 27.2* 27.0*  PLT 151 146*   Recent Labs    03/16/21 0531 03/17/21 0454  NA 134* 134*  K 4.2 3.8  CL 98 100  CO2 27 27  GLUCOSE 106* 95  BUN 30* 31*  CREATININE 1.26* 1.34*  CALCIUM 9.0 8.9    Intake/Output Summary (Last 24 hours) at 03/17/2021 0830 Last data filed at 03/17/2021 3220 Gross per 24 hour  Intake --  Output 1825 ml  Net -1825 ml     Pressure Injury 03/03/21 Heel Left Deep Tissue Pressure Injury - Purple or maroon localized area of discolored intact skin or blood-filled blister due to damage of underlying soft tissue from pressure and/or shear. (Active)  03/03/21 1114  Location: Heel  Location Orientation: Left  Staging: Deep Tissue Pressure Injury - Purple or maroon localized area of discolored intact skin or  blood-filled blister due to damage of underlying soft tissue from pressure and/or shear.  Wound Description (Comments):   Present on Admission: Yes    Physical Exam: Vital Signs Blood pressure (!) 106/53, pulse 75, temperature 98 F (36.7 C), temperature source Oral, resp. rate 18, height 5\' 8"  (1.727 m), weight 100 kg, SpO2 95 %.        General: awake, alert, appropriate, sitting up in bed; is NPO; NAD HENT: conjugate gaze; oropharynx moist CV: regular rate; no JVD Pulmonary: CTA B/L; no W/R/R- good air movement GI: soft, NT, ND, (+)BS Psychiatric: appropriate Neurological: HOH- no change in mentation Ext: no clubbing, cyanosis, or edema Skin: foot/heel wounds- DTI-s covered with dressings-foam- C/D/I Neuro:  HOH but able comprehend when he's able to hear. No focal language issues. Mild dysarthria. Strength grossly 4/5 bilateral UE/LE's. No focal sensory abnl. No abnl tone.  Musculoskeletal: Full ROM, No pain with AROM or PROM in the neck, trunk, or extremities. Posture appropriate     Assessment/Plan: 1. Functional deficits which require 3+ hours per day of interdisciplinary therapy in a comprehensive inpatient rehab setting. Physiatrist is providing close team supervision and 24 hour management  of active medical problems listed below. Physiatrist and rehab team continue to assess barriers to discharge/monitor patient progress toward functional and medical goals  Care Tool:  Bathing    Body parts bathed by patient: Right arm, Left arm, Chest, Buttocks, Front perineal area, Abdomen, Right upper leg, Left upper leg, Face   Body parts bathed by helper: Buttocks     Bathing assist Assist Level: Minimal Assistance - Patient > 75%     Upper Body Dressing/Undressing Upper body dressing   What is the patient wearing?: Pull over shirt    Upper body assist Assist Level: Minimal Assistance - Patient > 75%    Lower Body Dressing/Undressing Lower body dressing      What  is the patient wearing?: Incontinence brief, Pants     Lower body assist Assist for lower body dressing: Moderate Assistance - Patient 50 - 74%     Toileting Toileting    Toileting assist Assist for toileting: Moderate Assistance - Patient 50 - 74%     Transfers Chair/bed transfer  Transfers assist     Chair/bed transfer assist level: Minimal Assistance - Patient > 75%     Locomotion Ambulation   Ambulation assist   Ambulation activity did not occur: Safety/medical concerns  Assist level: Contact Guard/Touching assist Assistive device: Walker-rolling Max distance: 150'   Walk 10 feet activity   Assist  Walk 10 feet activity did not occur: Safety/medical concerns  Assist level: Contact Guard/Touching assist Assistive device: Walker-rolling   Walk 50 feet activity   Assist Walk 50 feet with 2 turns activity did not occur: Safety/medical concerns  Assist level: Contact Guard/Touching assist Assistive device: Walker-rolling    Walk 150 feet activity   Assist Walk 150 feet activity did not occur: Safety/medical concerns  Assist level: Contact Guard/Touching assist Assistive device: Walker-rolling    Walk 10 feet on uneven surface  activity   Assist Walk 10 feet on uneven surfaces activity did not occur: Safety/medical concerns         Wheelchair     Assist Is the patient using a wheelchair?: Yes Type of Wheelchair: Manual    Wheelchair assist level: Dependent - Patient 0%      Wheelchair 50 feet with 2 turns activity    Assist        Assist Level: Dependent - Patient 0%   Wheelchair 150 feet activity     Assist      Assist Level: Dependent - Patient 0%   Blood pressure (!) 106/53, pulse 75, temperature 98 F (36.7 C), temperature source Oral, resp. rate 18, height 5\' 8"  (1.727 m), weight 100 kg, SpO2 95 %.  Medical Problem List and Plan: 1. Functional deficits secondary to debility             -patient may shower  but PEG must be covered.              -ELOS/Goals: 2-3 weeks S-MinA             -d/c date 03/24/21- 2/3- Continue CIR- Gregory [name], OT and SLP 2.  Antithrombotics: -DVT/anticoagulation:  Mechanical: Sequential compression devices, below knee Bilateral lower extremities 1/31- although Hb is stable, Gregory [name] still having black tarry stools- will consult Cards about Eliquis- I think it's concerning to restart right now, but will let Cards decide.  - Cards and GI agree NO ELIQUIS or Blood thinners.              -antiplatelet therapy: N/A 3. Pain Management:  Tylenol 4. Mood:  LCSW to follow              -antipsychotic agents: N/A 5. Neuropsych: This patient is capable of making decisions on his own behalf. 6. Skin/Wound Care: pressure relief, protection to feet/heels 7. Fluids/Electrolytes/Nutrition: continue TF  -hyperkalemia: lokelma 5mg  daily x 2 doses, recheck labs Monday  2/1- see below under hyperkalemia 8. A flutter s/p TAVR 2020: plan is to hold eliquis for now and resume in the next few weeks potentially --HR controlled 1/29 1/30- hold eliquis- likely still bleeding- will call Cards in AM to see their plan.  9. Severe dysphagia w/aspiration: NPO with tube feeds for nutritonal support.  10. GIB: Monitor for signs of bleeding. Continue Protonix and Carafate. Holding Eliquis.  1/30- Hb 8.9- was 10.2- but previous was 8.9 so likely not bleeding severely, but will recheck labs in AM 1/31- Hb stable at 8.9- will monitor 2/1- GI maximized PPI to BID and maintained carafate- will also add FeSol solution BID for low iron levels 11. CKD 3a: BUN/SCr 124/2.07             --improved to 35/1.4 on 1/28  1/30- Cr 1.42 and BUN 34- overall stable- con't to monitor  -monitor output, labs Monday   1/31- Cr down slightly to 1.33 and BUN 32- con't to monitor  2/1- wil recheck in AM  2/2- Cr 1.26 and BUN down to 30- from 36- doing better- con't to monitor  2/3- Cr 1.34 and BUN 31- is chronic/baseline- will reduce dose  of Keflex as a result.  12. Constipation: Continue Senna daily. 2/2- LBM yesterday- is brown  13. HLD: continue atorvastatin 40mg  daily.  14. Hypothyroidism: continue Synthroid.  15. Hyperkalemia- likely due to TfF'- has PEG.   1/31- given multiple doses of Lokelma- down to 4.8- but will recheck again Thursday. 2/2- K+ 4.2- so doing much better on Nepro TF's  16. NPO-due to esophageal stricture/severe dysphagia on TF's  1/31- 1 reason came into hospital with failure to thrive due to not eating/drinking- per chart review- SLP involved, but not sure if will be able to not use PEG.   2/2- failed MBS - cannot have anything by mouth 17. ABLA  2/1- will add Feosol solution BID to help with blood loss.  18. Leukocytosis- UTI  2/2- Mild- WBC 11.1- will check CXR since is NPO and U/A and Cx to try and find source and recheck in AM  2/3- WBC up to 12.8- afebrile- however urine "so turbid couldn't run U/A"- has many bacteria- will recheck again in 3-4 days per d/w pharmacy- wil start Keflex 500 mg q12 hours- x 7 days- might need longer treatment, but will determine next week. Urine cloudy., odorous as well per nursing.    I spent a total of  51  minutes on total care today- >50% coordination of care- due to d/w pharmacy, nursing and PA about his care-      LOS: 7 days A FACE TO FACE EVALUATION WAS PERFORMED  Deaundra Dupriest 03/17/2021, 8:30 AM

## 2021-03-17 NOTE — Progress Notes (Signed)
Patient has not had BM yesterday or today--will administer a dose of miralax. Daughter in room updated on labs, Xray and antibiotic initiation. Educated on importance of pulmonary hygiene. Patient questioned if he was back on eliquis and we dicussed bleeding risks as well as Dr.Jordan's recommendations.

## 2021-03-17 NOTE — Progress Notes (Signed)
[facility]) Hospital Liaison note:  Received request for [facility] Palliative Care services. Will continue to follow for disposition.  Please call with any outpatient palliative questions or concerns.  Thank you for the opportunity to participate in this patient's care.  Thank you, Lorelee Market, LPN [facility] Liaison [phone]

## 2021-03-17 NOTE — Progress Notes (Signed)
Patient is A&O x 4 and able to make his needs known. Patient denies pain or discomfort. Educated patient and son regarding ABT Keflex. Mouth care completed as ordered. No S/S of distress. Call light and personal items within reach.

## 2021-03-17 NOTE — Progress Notes (Signed)
Occupational Therapy Session Note  Patient Details  Name: Gregory [name] MRN: [id number] Date of Birth: 1920/10/19  Today's Date: 03/17/2021 OT Individual Time: [phone] OT Individual Time Calculation (min): 70 min    Short Term Goals: Week 1:  OT Short Term Goal 1 (Week 1): Pt will complete sit<stand from toilet with no more than Mod A OT Short Term Goal 2 (Week 1): Pt will complete UB dressing with Mod A OT Short Term Goal 3 (Week 1): Pt will complete 1/3 components of donning pants using AE or adaptive techniques as needed  Skilled Therapeutic Interventions/Progress Updates:    Pt resting in bed upon arrival with son present. Pt requested to use toilet. Supine>sit EOB with supervision. Pt amb with RW to bathroom with CGA. Pt stood at toilet and held urinal with Lt hand. Pt required CGA. Pt returned to w/c. NuStep 7 mins level 2. 1# ankle weights donned an pt kicked large ball back to OTA 3x10 with each LE. Pt also engaged in BUE therex with 1# bar-curls and chest presses 3x10. Pt returned to room and amb with RW from doorway to recliner. Pt remained in relciner with all needs within reach and seat alarm activated. Son present.   Therapy Documentation Precautions:  Precautions Precautions: Fall Precaution Comments: NPO with PEG tube, posterior lean Restrictions Weight Bearing Restrictions: No  Pain:  Pt denies pain this morning.   Therapy/Group: Individual Therapy  Leroy Libman 03/17/2021, 9:49 AM

## 2021-03-17 NOTE — Progress Notes (Signed)
Speech Language Pathology Weekly Progress and Session Note ° °Patient Details  °Name: Gregory [name] °MRN: [id number] °Date of Birth: 11/27/1920 ° °Beginning of progress report period: March 11, 2021 °End of progress report period: March 17, 2021 ° °Today's Date: 03/17/2021 °SLP Individual Time: 1400-1445 °SLP Individual Time Calculation (min): 45 min ° °Short Term Goals: °Week 1: SLP Short Term Goal 1 (Week 1): Patient will demonstrate functional problem solving for basic, novel tasks (using a RW, etc)  with Min verbal cues. °SLP Short Term Goal 1 - Progress (Week 1): Met °SLP Short Term Goal 2 (Week 1): Patient will recall the steps for basic, novel tasks (using a RW, etc)  with Mod verbal cues. °SLP Short Term Goal 2 - Progress (Week 1): Met °SLP Short Term Goal 3 (Week 1): Patient will participate in RMT exercises to improve vocal and cough strength with Mod verbal cues for accuracy and a self-perceived effort level of 7/10. °SLP Short Term Goal 3 - Progress (Week 1): Met °SLP Short Term Goal 4 (Week 1): Pt will participate in instrumental swallow assessment to determine swallow function, least restrictive diet and risk of aspiration. °SLP Short Term Goal 4 - Progress (Week 1): Met ° °New Short Term Goals: °Week 2: SLP Short Term Goal 1 (Week 2): STG=LTG due to ELOS ° °Weekly Progress Updates: Patient has made steady progress and has met 4 of 4 STGs this reporting period. Patient is currently completing basic level cognitive tasks with min A in regards to basic problem solving and recall. Patient obtained a modified barium swallow study on 1/31 revealing moderate-to-severe pharyngeal dysphagia with recommendations for continuation of NPO status with alternative means of nutrition. Patient is currently consuming ice chips with SLP only following oral care, completing pharyngeal strengthening exercises, and respiratory muscle strength training. Patient and family education is ongoing. Patient would benefit from  continued skilled SLP intervention to maximize cognitive and swallow functioning. °  °Intensity: Minumum of 1-2 x/day, 30 to 90 minutes °Frequency: 3 to 5 out of 7 days °Duration/Length of Stay: 2/10 °Treatment/Interventions: Cognitive remediation/compensation;Cueing hierarchy;Environmental controls;Therapeutic Activities;Functional tasks;Patient/family education;Dysphagia/aspiration precaution training ° °Daily Session °Skilled Therapeutic Interventions: Skilled ST treatment focused on swallowing goals and RMT. Pt was accompanied by daughter who was receptive of education throughout session regarding swallowing safety and cognitive goals. Patient performed oral care with min A using suction toothette. Patient consumed ice chips x7 with min A cues for multiple swallows. Pt exhibited cough response after initial trial, followed by no further s/sx of aspiration. SLP educated on respiratory muscle training (RMT). Pt agreeable to assessment. Obtained mean inspiratory pressure (MIT =23) and mean expiratory pressure (MEP =41). Initiated use of IMT trainer with device resistance set to 9cm/h20, as patient was unable to tolerate higher resistance. Pt completed 25 repetitions (5 sets of 5) with mod fading to min A cues for successful completion.  Pt unable to understand rate of perceived exertion using number scale. Pt denied s/s of SOB or lightheadedness. Plan to initiate EMST during upcoming sessions. Unable this date due to time constraints. SLP reinforced to daughter that patient is only to have ice chips and complete RMT with SLP only at this time. Patient was left in recliner with alarm activated and immediate needs within reach at end of session. Continue per current plan of care.      ° °General  °  °Pain ° None ° °Therapy/Group: Individual Therapy ° ° T  °03/17/2021, 4:35 PM ° ° ° ° ° ° °

## 2021-03-17 NOTE — Progress Notes (Signed)
Physical Therapy Session Note  Patient Details  Name: Gregory [name] MRN: [id number] Date of Birth: 05/13/1920  Today's Date: 03/17/2021 PT Individual Time: 0950-1100 PT Individual Time Calculation (min): 70 min   Short Term Goals: Week 1:  PT Short Term Goal 1 (Week 1): Pt will perform supine<>sit with min assist PT Short Term Goal 2 (Week 1): Pt will perform sit<>stand transfers using LRAD with min assist PT Short Term Goal 3 (Week 1): Pt will perform stand pivot transfers using LRAD with min assist PT Short Term Goal 4 (Week 1): Pt will ambulate at least 157f using LRAD with min assist PT Short Term Goal 5 (Week 1): Pt will navigate 4 stairs using HRs with min assist  Skilled Therapeutic Interventions/Progress Updates: Pt presented in recliner with son present agreeable to therapy. Son surprised that he has therapy session as per son "he just got in recliner after OT". Upon looking at schedule noted that sessions were dovetailed. Discussed different between OT/PT however also advised that will make note to space out therapies for future. Son expressed appreciation. Pt performed ambulatory transfer to w/c with RW and CGA overall. Pt did demonstrate good safety with hand placement and RW for Sit to stand and transfer. Pt transported to day room and participated in Cybex Kinetron 50cm/sec 3 x 2 min bouts for endurance and general conditioning. Pt then participated in obstacle course including weaving through cones and stepping over shuffleboard sticks. Pt required minA for RW initially but was able to improve to CGA with RW on second bout. Pt then ambulated ~319fx 2 while stepping over cones. Pt was able to successfully clear all cones and safely manage RW with CGA. Pt then transferred to rehab gym and ascended/descended x 4 steps with B rails with CGA with step to pattern. Pt transported back to room and performed ambulatory transfer back to recliner. Pt left in recliner at end of session with seat  alarm on, call bell within reach and needs met.      Therapy Documentation Precautions:  Precautions Precautions: Fall Precaution Comments: NPO with PEG tube, posterior lean Restrictions Weight Bearing Restrictions: No General:   Vital Signs:  Pain: Pain Assessment Pain Scale: 0-10 Pain Score: 0-No pain Faces Pain Scale: No hurt Mobility:   Locomotion :    Trunk/Postural Assessment :    Balance:   Exercises:   Other Treatments:      Therapy/Group: Individual Therapy  George Haggart 03/17/2021, 12:55 PM

## 2021-03-18 LAB — GLUCOSE, CAPILLARY
Glucose-Capillary: 106 mg/dL — ABNORMAL HIGH (ref 70–99)
Glucose-Capillary: 139 mg/dL — ABNORMAL HIGH (ref 70–99)
Glucose-Capillary: 79 mg/dL (ref 70–99)
Glucose-Capillary: 84 mg/dL (ref 70–99)

## 2021-03-18 LAB — URINE CULTURE: Culture: >=100000 — AB

## 2021-03-18 NOTE — Progress Notes (Signed)
Speech Language Pathology Daily Session Note  Patient Details  Name: Gregory [name] MRN: [id number] Date of Birth: [birth year]  Today's Date: 03/18/2021 SLP Individual Time: 1115-1200 SLP Individual Time Calculation (min): 45 min  Short Term Goals: Week 2: SLP Short Term Goal 1 (Week 2): STG=LTG due to ELOS  Skilled Therapeutic Interventions: Skilled ST treatment focused on swallowing goals and education with son. Patient was sleeping in dark environment (all lights turned off, blinds closed) and roused to min verbal stimuli. Pt agreeable to ST intervention and turning dim light on. Patient exhibited increased tiredness today but improved as session progressed. Pt required total A for oral care using suction toothette today mainly attributed to strong preference to keep his arms underneath blankets to keep warm. This also resulted in need for total feeding A with presentation of ice chips. Pt's son inquired about results of recent MBS and current ST POC as it pertains to pt's swallow function. SLP provided extensive education on results of MBS, oral care, pharyngeal strengthening exercises, implementation of RMT, 3 pillars of aspiration pneumonia, and swallowing precautions/compensatory strategies implemented during trials to enhance pt's safety and minimizing risk of adverse events related to aspiration (e.g, pneumonia). Son verbalized understanding through teach back and agreement with current plan. Patient consumed single ice chips x8 without overt s/sx of aspiration with the exception of delayed, productive cough at end of session ~23 minutes following ice chip trials. Pt executed secondary swallow with improved carry over as compared to yesterdays session requiring overall sup A cues. Pt agreeable to participate in EMST with resistance set at 19cm/h20. Pt competed 25 repetitions (5 sets of 5) with min A verbal cues. Pt unable to identify level of perceived effort. Stated "it all feels the same."  Current resistance level appeared to require at least moderate (ideal) effort based on various adjustments and observations of pt's response. Patient was left in bed with alarm activated and immediate needs within reach at end of session. Continue per current plan of care.      Pain Pain Assessment Pain Scale: 0-10 Pain Score: 0-No pain  Therapy/Group: Individual Therapy  [name] 03/18/2021, 12:43 PM

## 2021-03-18 NOTE — Progress Notes (Signed)
PROGRESS NOTE   Subjective/Complaints: Sleeping in dark room, easily arousable Son at bedside Tolerated SLP well today Denies pain     ROS:   Pt denies SOB, abd pain, CP, N/V/C/D, and vision changes, pain    Objective:   No results found. Recent Labs    03/16/21 0531 03/17/21 0454  WBC 11.1* 12.8*  HGB 8.8* 8.6*  HCT 27.2* 27.0*  PLT 151 146*   Recent Labs    03/16/21 0531 03/17/21 0454  NA 134* 134*  K 4.2 3.8  CL 98 100  CO2 27 27  GLUCOSE 106* 95  BUN 30* 31*  CREATININE 1.26* 1.34*  CALCIUM 9.0 8.9    Intake/Output Summary (Last 24 hours) at 03/18/2021 1827 Last data filed at 03/18/2021 1730 Gross per 24 hour  Intake --  Output 2525 ml  Net -2525 ml     Pressure Injury 03/03/21 Heel Left Deep Tissue Pressure Injury - Purple or maroon localized area of discolored intact skin or blood-filled blister due to damage of underlying soft tissue from pressure and/or shear. (Active)  03/03/21 1114  Location: Heel  Location Orientation: Left  Staging: Deep Tissue Pressure Injury - Purple or maroon localized area of discolored intact skin or blood-filled blister due to damage of underlying soft tissue from pressure and/or shear.  Wound Description (Comments):   Present on Admission: Yes    Physical Exam: Vital Signs Blood pressure (!) 120/52, pulse 70, temperature 98.2 F (36.8 C), resp. rate 20, height 5\' 8"  (1.727 m), weight 100 kg, SpO2 99 %.  Gen: no distress, normal appearing HEENT: oral mucosa pink and moist, NCAT Cardio: Reg rate Chest: normal effort, normal rate of breathing Abd: soft, non-distended Ext: no clubbing, cyanosis, or edema Skin: foot/heel wounds- DTI-s covered with dressings-foam- C/D/I Neuro:  HOH but able comprehend when he's able to hear. No focal language issues. Mild dysarthria. Strength grossly 4/5 bilateral UE/LE's. No focal sensory abnl. No abnl tone.  Musculoskeletal:  Full ROM, No pain with AROM or PROM in the neck, trunk, or extremities. Posture appropriate     Assessment/Plan: 1. Functional deficits which require 3+ hours per day of interdisciplinary therapy in a comprehensive inpatient rehab setting. Physiatrist is providing close team supervision and 24 hour management of active medical problems listed below. Physiatrist and rehab team continue to assess barriers to discharge/monitor patient progress toward functional and medical goals  Care Tool:  Bathing    Body parts bathed by patient: Right arm, Left arm, Chest, Buttocks, Front perineal area, Abdomen, Right upper leg, Left upper leg, Face   Body parts bathed by helper: Buttocks     Bathing assist Assist Level: Minimal Assistance - Patient > 75%     Upper Body Dressing/Undressing Upper body dressing   What is the patient wearing?: Pull over shirt    Upper body assist Assist Level: Minimal Assistance - Patient > 75%    Lower Body Dressing/Undressing Lower body dressing      What is the patient wearing?: Incontinence brief, Pants     Lower body assist Assist for lower body dressing: Moderate Assistance - Patient 50 - 74%     Toileting Toileting  Toileting assist Assist for toileting: Moderate Assistance - Patient 50 - 74%     Transfers Chair/bed transfer  Transfers assist     Chair/bed transfer assist level: Minimal Assistance - Patient > 75%     Locomotion Ambulation   Ambulation assist   Ambulation activity did not occur: Safety/medical concerns  Assist level: Contact Guard/Touching assist Assistive device: Walker-rolling Max distance: 150'   Walk 10 feet activity   Assist  Walk 10 feet activity did not occur: Safety/medical concerns  Assist level: Contact Guard/Touching assist Assistive device: Walker-rolling   Walk 50 feet activity   Assist Walk 50 feet with 2 turns activity did not occur: Safety/medical concerns  Assist level: Contact  Guard/Touching assist Assistive device: Walker-rolling    Walk 150 feet activity   Assist Walk 150 feet activity did not occur: Safety/medical concerns  Assist level: Contact Guard/Touching assist Assistive device: Walker-rolling    Walk 10 feet on uneven surface  activity   Assist Walk 10 feet on uneven surfaces activity did not occur: Safety/medical concerns         Wheelchair     Assist Is the patient using a wheelchair?: Yes Type of Wheelchair: Manual    Wheelchair assist level: Dependent - Patient 0%      Wheelchair 50 feet with 2 turns activity    Assist        Assist Level: Dependent - Patient 0%   Wheelchair 150 feet activity     Assist      Assist Level: Dependent - Patient 0%   Blood pressure (!) 120/52, pulse 70, temperature 98.2 F (36.8 C), resp. rate 20, height 5\' 8"  (1.727 m), weight 100 kg, SpO2 99 %.  Medical Problem List and Plan: 1. Functional deficits secondary to debility             -patient may shower but PEG must be covered.              -ELOS/Goals: 2-3 weeks S-MinA             -d/c date 03/24/21- Continue CIR- PT, OT and SLP 2.  Antithrombotics: -DVT/anticoagulation:  Mechanical: Sequential compression devices, below knee Bilateral lower extremities 1/31- although Hb is stable, pt still having black tarry stools- will consult Cards about Eliquis- I think it's concerning to restart right now, but will let Cards decide.  - Cards and GI agree NO ELIQUIS or Blood thinners.              -antiplatelet therapy: N/A 3. Pain Management: Tylenol 4. Mood:  LCSW to follow              -antipsychotic agents: N/A 5. Neuropsych: This patient is capable of making decisions on his own behalf. 6. Skin/Wound Care: pressure relief, protection to feet/heels 7. Fluids/Electrolytes/Nutrition: continue TF  -hyperkalemia: lokelma 5mg  daily x 2 doses, recheck labs Monday  2/1- see below under hyperkalemia 8. A flutter s/p TAVR 2020: plan  is to hold eliquis for now and resume in the next few weeks potentially --HR controlled 1/29 1/30- hold eliquis- likely still bleeding- will call Cards in AM to see their plan.  9. Severe dysphagia w/aspiration: NPO with tube feeds for nutritonal support.  10. GIB: Monitor for signs of bleeding. Continue Protonix and Carafate. Holding Eliquis.  1/30- Hb 8.9- was 10.2- but previous was 8.9 so likely not bleeding severely, but will recheck labs in AM 1/31- Hb stable at 8.9- will monitor 2/1- GI maximized PPI to  BID and maintained carafate- will also add FeSol solution BID for low iron levels 11. CKD 3a: BUN/SCr 124/2.07             --improved to 35/1.4 on 1/28  1/30- Cr 1.42 and BUN 34- overall stable- con't to monitor  -monitor output, labs Monday   1/31- Cr down slightly to 1.33 and BUN 32- con't to monitor  2/1- wil recheck in AM  2/2- Cr 1.26 and BUN down to 30- from 36- doing better- con't to monitor  2/3- Cr 1.34 and BUN 31- is chronic/baseline- will reduce dose of Keflex as a result.  12. Constipation: Continue Senna daily. 2/2- LBM yesterday- is brown  13. HLD: continue atorvastatin 40mg  daily.  14. Hypothyroidism: continue Synthroid.  15. Hyperkalemia- likely due to TfF'- has PEG.   1/31- given multiple doses of Lokelma- down to 4.8- but will recheck again Thursday. 2/2- K+ 4.2- so doing much better on Nepro TF's  16. NPO-due to esophageal stricture/severe dysphagia on TF's  1/31- 1 reason came into hospital with failure to thrive due to not eating/drinking- per chart review- SLP involved, but not sure if will be able to not use PEG.   2/2- failed MBS - cannot have anything by mouth  2/4: continue feeding via PEG 17. ABLA  2/1- will add Feosol solution BID to help with blood loss. 24: hgb 8/6 on 2/3, monitor weekly  18. Leukocytosis- UTI  2/2- Mild- WBC 11.1- will check CXR since is NPO and U/A and Cx to try and find source and recheck in AM  2/3- WBC up to 12.8- afebrile-  however urine "so turbid couldn't run U/A"- has many bacteria- will recheck again in 3-4 days per d/w pharmacy- wil start Keflex 500 mg q12 hours- x 7 days- might need longer treatment, but will determine next week. Urine cloudy., odorous as well per nursing.    2/4: UC with >100,000 colonies citrobacter, f/u sensititivities      LOS: 8 days A FACE TO FACE EVALUATION WAS PERFORMED  Clide Deutscher Stacyann Mcconaughy 03/18/2021, 6:27 PM

## 2021-03-19 LAB — GLUCOSE, CAPILLARY
Glucose-Capillary: 100 mg/dL — ABNORMAL HIGH (ref 70–99)
Glucose-Capillary: 108 mg/dL — ABNORMAL HIGH (ref 70–99)
Glucose-Capillary: 90 mg/dL (ref 70–99)
Glucose-Capillary: 91 mg/dL (ref 70–99)
Glucose-Capillary: 95 mg/dL (ref 70–99)

## 2021-03-19 LAB — URINE CULTURE: Culture: >=100000 — AB

## 2021-03-19 NOTE — Progress Notes (Signed)
Physical Therapy Session Note  Patient Details  Name: Gregory [name] MRN: [id number] Date of Birth: [birth year]  Today's Date: 03/19/2021 PT Individual Time: 0177-9390 PT Individual Time Calculation (min): 54 min   Short Term Goals: Week 1:  PT Short Term Goal 1 (Week 1): Pt will perform supine<>sit with min assist PT Short Term Goal 1 - Progress (Week 1): Met PT Short Term Goal 2 (Week 1): Pt will perform sit<>stand transfers using LRAD with min assist PT Short Term Goal 2 - Progress (Week 1): Met PT Short Term Goal 3 (Week 1): Pt will perform stand pivot transfers using LRAD with min assist PT Short Term Goal 3 - Progress (Week 1): Met PT Short Term Goal 4 (Week 1): Pt will ambulate at least 154f using LRAD with min assist PT Short Term Goal 4 - Progress (Week 1): Met PT Short Term Goal 5 (Week 1): Pt will navigate 4 stairs using HRs with min assist PT Short Term Goal 5 - Progress (Week 1): Met  Skilled Therapeutic Interventions/Progress Updates:  Chart reviewed and pt agreeable to therapy. Pt received in bed with family present and no c/o pain. Session focused on endurance exercises to increase activity tolerance, safety judgement during ambulation, and balance exercises for safe home access. Of note, pt verbalized need for toileting and required Total A to use urinal. [facility]. Pt ransported to therapy gym for time management. In gym, pt completed 10 mins on NuStep for light interval training. Pt transferred to NuStep with SSTT using CGA + RW. On NuStep, pt completed 2 mins warm up at level 1 workload followed by 7 rounds of low-med-high intervals ranging levels 2-4 workload a. Pt then maintained level 1-2 workload for cool down. For safety awareness, pt tasked with ambulating as far as tolerated with safe [place] to [place] was able to ambulate 2030fwith RW + close supervision and [facility] need of assistance. Pt  returned to room for balance exercises completing 2 rounds of BLE SLS x 30sec using handrail + 10 heel raises using handrail + 30 sec eyes closed regular stance no handhold + 30 secs closed stance with no handhold. At end of session, pt was left [facility] present with alarm engaged, nurse call bell and all needs in reach.   Therapy Documentation Precautions:  Precautions Precautions: Fall Precaution Comments: NPO with PEG tube, posterior lean Restrictions Weight Bearing Restrictions: No     Therapy/Group: Individual Therapy, PT, DPT  [place] Saa/06/2021, 3:38 PM

## 2021-03-19 NOTE — Progress Notes (Signed)
Speech Language Pathology Daily Session Note  Patient Details  Name: Cisco Kindt MRN: [id number] Date of Birth: 1920-04-14  Today's Date: 03/19/2021 SLP Individual Time: 1117-1200 SLP Individual Time Calculation (min): 43 min  Short Term Goals: Week 2: SLP Short Term Goal 1 (Week 2): STG=LTG due to ELOS  Skilled Therapeutic Interventions: Skilled ST services focused on swallow and cognitive skills. SLP repositioned pt in bed for oral care and PO intake. Pt completed oral care with set up assist utilizing suction toothbrush. Pt consumed x1 ice chip with immediate cough, likely impacted by positioning, pt had slid further down in the bed. SLP and pt's son repositioned pt in bed near 90 degrees. Pt demonstrated no further s/s aspiration with remainder of ice chips trials (total of 8) with proper positioning. SLP educated pt on CTAR and use of EMST to strength swallow function. Pt completed CTAR while consuming ice chip trials x7, although pt was only able to complete a partial chin tuck and demonstrated delayed in swallow initiation when completing x2 secondary swallows. Pt completed EMST set at 19 cm H20 a set of 10 with min A verbal cues. SLP began reassessment of cognitive skills utilizing SLUMS, but was unable to finish due to time restraints. Pt noted baseline deficits in memory and some minor acute changes in memory following the recall of information following a short paragraph, deficits noted in retrieval verse storage. SLP will complete assessment next session, but pt's cognition appears to be improving. Pt was left with family in room. Recommend to continue skilled ST services.     Pain Pain Assessment Pain Score: 0-No pain  Therapy/Group: Individual Therapy  Stuart Guillen  [employer] 03/19/2021, 12:27 PM

## 2021-03-19 NOTE — Progress Notes (Signed)
Patient rested well with frequent wake ups during the night to urinate. Pt. C/o pain in right heel which was relieved by removal of parfo boot and elevation with pillow so that heel was not touching surface. Also discrepancy in weight this AM. Pt said staff is not removing equipment and bed linen/ pillows when weighing him. Nurse removed parfo boots, extr blankets and pillows and equipment that was on foot of his bed.

## 2021-03-19 NOTE — Progress Notes (Signed)
Occupational Therapy Session Note  Patient Details  Name: Gregory [name] MRN: [id number] Date of Birth: 09-Jan-1921  Today's Date: 03/19/2021 OT Individual Time: 0800-0900 OT Individual Time Calculation (min): 60 min    Short Term Goals: Week 2:  OT Short Term Goal 1 (Week 2): STG=LTG 2/2 ELOS (continued working to supervision goals)  Skilled Therapeutic Interventions/Progress Updates:  Pt greeted  supine in bed agreeable to OT intervention. Session focus on BADL reeducation, functional mobility, dynamic standing balance and decreasing overall caregiver burden.  Pt completed supine>sit with supervision with use of bed features. Pt completed sit<>stand with CGA from EOB and completed ambulatory tranfer from EOB >sink with Rw for standing grooming tasks. Pt able to stand at sink ~ 10 mins to complete oral care and washing his face with overall CGA with no UE support. Pt denied need for further ADLs in room. Pt transported to apt with total A for time mgmt. Pt completed ambulatory transfer to straight back chair with no arm rests ( pt reports he sits in a straight back chair at home with arm rests) and to bed with overall CGA with RW, MIN A needed to elevate BLEs to bed. Pt completed functional mobility to gym with rw and cGA. Pt completed x5 sit<>stands from EOM to work on repetition of positioning and hand placement as pt prefers to pull up on RW. Pt completed all stands with CGA. Pt completed functional mobility to room with rw and CGA, pt returned to supine with CGA.  pt left supine in bed with all needs within reach and son present.                      Therapy Documentation Precautions:  Precautions Precautions: Fall Precaution Comments: NPO with PEG tube, posterior lean Restrictions Weight Bearing Restrictions: No  Pain: no pain reported during session     Therapy/Group: Individual Therapy  Corinne Ports [facility] 03/19/2021, 9:02 AM

## 2021-03-19 NOTE — Progress Notes (Signed)
Physical Therapy Weekly Progress Note  Patient Details  Name: Gregory [name] MRN: [id number] Date of Birth: [birth year]  Beginning of progress report period: March 11, 2021 End of progress report period: March 19, 2021  Today's Date: 03/19/2021 PT Individual Time: 1600-1705 PT Individual Time Calculation (min): 65 min   Patient has met 5 of 5 short term goals.  Pt is able to perform sit to stand transfers with CGA to min A for the most part, occasionally requiring mod A when initially moving at beginning of therapy sessions. Once pt is standing with RW he is CGA overall for mobility and is able to ambulate up to 180 ft at one time. He is able to navigate stairs and curb step with RW and/or handrails with min A.  Patient continues to demonstrate the following deficits muscle weakness and muscle joint tightness, decreased cardiorespiratoy endurance, and decreased standing balance, decreased postural control, and decreased balance strategies and therefore will continue to benefit from skilled PT intervention to increase functional independence with mobility.  Patient progressing toward long term goals..  Continue plan of care.  PT Short Term Goals Week 1:  PT Short Term Goal 1 (Week 1): Pt will perform supine<>sit with min assist PT Short Term Goal 1 - Progress (Week 1): Met PT Short Term Goal 2 (Week 1): Pt will perform sit<>stand transfers using LRAD with min assist PT Short Term Goal 2 - Progress (Week 1): Met PT Short Term Goal 3 (Week 1): Pt will perform stand pivot transfers using LRAD with min assist PT Short Term Goal 3 - Progress (Week 1): Met PT Short Term Goal 4 (Week 1): Pt will ambulate at least 150f using LRAD with min assist PT Short Term Goal 4 - Progress (Week 1): Met PT Short Term Goal 5 (Week 1): Pt will navigate 4 stairs using HRs with min assist PT Short Term Goal 5 - Progress (Week 1): Met Week 2:  PT Short Term Goal 1 (Week 2): =LTG due to ELOS  Skilled  Therapeutic Interventions/Progress Updates:    Pt received seated in recliner in room asleep, arousable and agreeable to PT session. No complaints of pain. Pt reports urge to use the bathroom when prompted. Sit to stand with mod A to RW upon initially standing this session. Ambulatory transfer into bathroom with RW and min A for balance. Toilet transfer with min A with use of RW and grab bar for safety. Pt able to doff clothing with min A for standing balance due to posterior lean, assist needed to pull pants up over hips following toileting. Ambulation x 180 ft with RW with CGA for balance. Ascend/descend one 6" curb step with RW and min A for balance to simulate home environment, cues for sequencing and safe RW management. Ambulation across uneven surface with RW and CGA to min A for balance to simulate home environment. Pt requests to return to bed at end of session due to fatigue. Sit to supine CGA with increased time needed for BLE management. Pt left supine in bed in care of NT and nursing at end of session, daughter present.  Therapy Documentation Precautions:  Precautions Precautions: Fall Precaution Comments: NPO with PEG tube, posterior lean Restrictions Weight Bearing Restrictions: No      Therapy/Group: Individual Therapy   TExcell Seltzer PT, DPT, CSRS 03/19/2021, 5:08 PM

## 2021-03-20 LAB — GLUCOSE, CAPILLARY
Glucose-Capillary: 87 mg/dL (ref 70–99)
Glucose-Capillary: 107 mg/dL — ABNORMAL HIGH (ref 70–99)

## 2021-03-20 LAB — COMPREHENSIVE METABOLIC PANEL
ALT: 31 U/L (ref 0–44)
AST: 31 U/L (ref 15–41)
Albumin: 2.7 g/dL — ABNORMAL LOW (ref 3.5–5.0)
Alkaline Phosphatase: 68 U/L (ref 38–126)
Anion gap: 8 (ref 5–15)
BUN: 29 mg/dL — ABNORMAL HIGH (ref 8–23)
CO2: 30 mmol/L (ref 22–32)
Calcium: 9.2 mg/dL (ref 8.9–10.3)
Chloride: 98 mmol/L (ref 98–111)
Creatinine, Ser: 1.34 mg/dL — ABNORMAL HIGH (ref 0.61–1.24)
GFR, Estimated: 48 mL/min — ABNORMAL LOW (ref >60)
Glucose, Bld: 95 mg/dL (ref 70–99)
Potassium: 3.8 mmol/L (ref 3.5–5.1)
Sodium: 136 mmol/L (ref 135–145)
Total Bilirubin: 0.5 mg/dL (ref 0.3–1.2)
Total Protein: 6 g/dL — ABNORMAL LOW (ref 6.5–8.1)

## 2021-03-20 LAB — CBC
HCT: 28.7 % — ABNORMAL LOW (ref 39.0–52.0)
Hemoglobin: 8.9 g/dL — ABNORMAL LOW (ref 13.0–17.0)
MCH: 30.3 pg (ref 26.0–34.0)
MCHC: 31 g/dL (ref 30.0–36.0)
MCV: 97.6 fL (ref 80.0–100.0)
Platelets: 179 10*3/uL (ref 150–400)
RBC: 2.94 MIL/uL — ABNORMAL LOW (ref 4.22–5.81)
RDW: 16.8 % — ABNORMAL HIGH (ref 11.5–15.5)
WBC: 8.6 10*3/uL (ref 4.0–10.5)
nRBC: 0 % (ref 0.0–0.2)

## 2021-03-20 MED ORDER — SORBITOL 70 % SOLN
30.0000 mL | Freq: Once | Status: AC
  Administered 2021-03-20: 30 mL via ORAL
  Filled 2021-03-20: qty 30

## 2021-03-20 MED ORDER — SODIUM CHLORIDE 0.9 % IV SOLN
2.0000 g | INTRAVENOUS | Status: DC
  Filled 2021-03-20: qty 2

## 2021-03-20 MED ORDER — SODIUM CHLORIDE 0.9 % IV SOLN
2.0000 g | INTRAVENOUS | Status: DC
  Administered 2021-03-20 – 2021-03-22 (×3): 2 g via INTRAVENOUS
  Filled 2021-03-20 (×4): qty 2

## 2021-03-20 NOTE — Plan of Care (Signed)
°  Problem: RH Problem Solving Goal: LTG Patient will demonstrate problem solving for (SLP) Description: LTG:  Patient will demonstrate problem solving for basic/complex daily situations with cues  (SLP) Outcome: Adequate for Discharge Note: At cognitive baseline    Problem: RH Memory Goal: LTG Patient will use memory compensatory aids to (SLP) Description: LTG:  Patient will use memory compensatory aids to recall biographical/new, daily complex information with cues (SLP) Outcome: Adequate for Discharge Note: At cognitive baseline   Problem: RH Awareness Goal: LTG: Patient will demonstrate awareness during functional activites type of (SLP) Description: LTG: Patient will demonstrate awareness during functional activites type of (SLP) Outcome: Adequate for Discharge Note: At cognitive baseline

## 2021-03-20 NOTE — Progress Notes (Signed)
Consulted to place PIV. Patient working with PT. RN will re enter consult when patient back in bed.

## 2021-03-20 NOTE — Progress Notes (Signed)
Occupational Therapy Session Note  Patient Details  Name: Gregory [name] MRN: [id number] Date of Birth: 02-21-1920  Today's Date: 03/20/2021 OT Individual Time: 0930-1040 OT Individual Time Calculation (min): 70 min    Short Term Goals: Week 2:  OT Short Term Goal 1 (Week 2): STG=LTG 2/2 ELOS (continued working to supervision goals)  Skilled Therapeutic Interventions/Progress Updates:    Pt resting in bed upon arrival. Pt reported he needed to urinate but didn't think he could "make it" to the bathroom. Supine>sit EOB with supervision. Pt used urinal seated EOB. Sit<>stand with min A for clothing management. Pt amb with RW to w/c at sink and completed UB bathing/dressing at sink. Sit<>stand from w/c to pull pants over hips after threaded over BLE. Pt transitioned to day room for 7 mins NuStep level 3 for endurance/strengthening. Sit<>stand X 10 with CGA. Pt kicked ball with BLE (1# ankle wts)-3x10. Chest presses with 2# bar-3x10. Pt returned to room and amb with RW from doorway to recliner. Pt remained in recliner with all needs within reach and son present.   Therapy Documentation Precautions:  Precautions Precautions: Fall Precaution Comments: NPO with PEG tube, posterior lean Restrictions Weight Bearing Restrictions: No  Pain:  Pt denies pain this morning   Therapy/Group: Individual Therapy  Leroy Libman 03/20/2021, 12:17 PM

## 2021-03-20 NOTE — Progress Notes (Signed)
Slept well through the night. PEG tube placement verified and insitu. Water flushes per orders. Assisted with bathroom privileges. Denies apin. Safety maintained.

## 2021-03-20 NOTE — Progress Notes (Signed)
PROGRESS NOTE   Subjective/Complaints:   Pt reports just woke up- doesn't remember when last had BM- feels constipated.   Using urinal.   UTI is resistant to Keflex and pharmacy recommends Cefepime due to Cx results.   Per chart, LBM was 2/3- will give sorbitol this evening.  BUN down slightly to 29 and Cr stable at 1.34    ROS:   Pt denies SOB, abd pain, CP, N/V/ (+) C/D, and vision changes   Objective:   No results found. Recent Labs    03/20/21 0650  WBC 8.6  HGB 8.9*  HCT 28.7*  PLT 179   Recent Labs    03/20/21 0650  NA 136  K 3.8  CL 98  CO2 30  GLUCOSE 95  BUN 29*  CREATININE 1.34*  CALCIUM 9.2    Intake/Output Summary (Last 24 hours) at 03/20/2021 1848 Last data filed at 03/20/2021 0600 Gross per 24 hour  Intake --  Output 1275 ml  Net -1275 ml     Pressure Injury 03/03/21 Heel Left Deep Tissue Pressure Injury - Purple or maroon localized area of discolored intact skin or blood-filled blister due to damage of underlying soft tissue from pressure and/or shear. (Active)  03/03/21 1114  Location: Heel  Location Orientation: Left  Staging: Deep Tissue Pressure Injury - Purple or maroon localized area of discolored intact skin or blood-filled blister due to damage of underlying soft tissue from pressure and/or shear.  Wound Description (Comments):   Present on Admission: Yes    Physical Exam: Vital Signs Blood pressure (!) 118/48, pulse 70, temperature 98.1 F (36.7 C), resp. rate 18, height 5\' 8"  (1.727 m), weight 82 kg, SpO2 99 %.    General: awake, alert, appropriate, laying supine in bed; wants light off; wants door closed; NAD HENT: conjugate gaze; oropharynx moist CV: regular rate; no JVD Pulmonary: CTA B/L; no W/R/R- good air movement GI: soft, NT, ND, abd binder on, but too high up; very hypoactive, but flat Psychiatric: appropriate; sleepy Neurological: HOH- vague- sleepy Ext:  no clubbing, cyanosis, or edema Skin: foot/heel wounds- DTI-s covered with dressings-foam- C/D/I Neuro:  HOH but able comprehend when he's able to hear. No focal language issues. Mild dysarthria. Strength grossly 4/5 bilateral UE/LE's. No focal sensory abnl. No abnl tone.  Musculoskeletal: Full ROM, No pain with AROM or PROM in the neck, trunk, or extremities. Posture appropriate     Assessment/Plan: 1. Functional deficits which require 3+ hours per day of interdisciplinary therapy in a comprehensive inpatient rehab setting. Physiatrist is providing close team supervision and 24 hour management of active medical problems listed below. Physiatrist and rehab team continue to assess barriers to discharge/monitor patient progress toward functional and medical goals  Care Tool:  Bathing    Body parts bathed by patient: Right arm, Left arm, Chest, Buttocks, Front perineal area, Abdomen, Right upper leg, Left upper leg, Face   Body parts bathed by helper: Buttocks     Bathing assist Assist Level: Minimal Assistance - Patient > 75%     Upper Body Dressing/Undressing Upper body dressing   What is the patient wearing?: Pull over shirt    Upper body  assist Assist Level: Minimal Assistance - Patient > 75%    Lower Body Dressing/Undressing Lower body dressing      What is the patient wearing?: Incontinence brief, Pants     Lower body assist Assist for lower body dressing: Moderate Assistance - Patient 50 - 74%     Toileting Toileting    Toileting assist Assist for toileting: Moderate Assistance - Patient 50 - 74%     Transfers Chair/bed transfer  Transfers assist     Chair/bed transfer assist level: Contact Guard/Touching assist     Locomotion Ambulation   Ambulation assist   Ambulation activity did not occur: Safety/medical concerns  Assist level: Contact Guard/Touching assist Assistive device: Walker-rolling Max distance: 150'   Walk 10 feet  activity   Assist  Walk 10 feet activity did not occur: Safety/medical concerns  Assist level: Contact Guard/Touching assist Assistive device: Walker-rolling   Walk 50 feet activity   Assist Walk 50 feet with 2 turns activity did not occur: Safety/medical concerns  Assist level: Contact Guard/Touching assist Assistive device: Walker-rolling    Walk 150 feet activity   Assist Walk 150 feet activity did not occur: Safety/medical concerns  Assist level: Contact Guard/Touching assist Assistive device: Walker-rolling    Walk 10 feet on uneven surface  activity   Assist Walk 10 feet on uneven surfaces activity did not occur: Safety/medical concerns   Assist level: Minimal Assistance - Patient > 75% Assistive device: Walker-rolling   Wheelchair     Assist Is the patient using a wheelchair?: Yes Type of Wheelchair: Manual    Wheelchair assist level: Dependent - Patient 0%      Wheelchair 50 feet with 2 turns activity    Assist        Assist Level: Dependent - Patient 0%   Wheelchair 150 feet activity     Assist      Assist Level: Dependent - Patient 0%   Blood pressure (!) 118/48, pulse 70, temperature 98.1 F (36.7 C), resp. rate 18, height 5\' 8"  (1.727 m), weight 82 kg, SpO2 99 %.  Medical Problem List and Plan: 1. Functional deficits secondary to debility             -patient may shower but PEG must be covered.              -ELOS/Goals: 2-3 weeks S-MinA             -d/c date 03/24/21- Continue CIR- PT, OT and SLP Hopefully can d/c 2/10, but concerned due to IV ABX? 2.  Antithrombotics: -DVT/anticoagulation:  Mechanical: Sequential compression devices, below knee Bilateral lower extremities 1/31- although Hb is stable, pt still having black tarry stools- will consult Cards about Eliquis- I think it's concerning to restart right now, but will let Cards decide.  - Cards and GI agree NO ELIQUIS or Blood thinners.              -antiplatelet  therapy: N/A 3. Pain Management: Tylenol 4. Mood:  LCSW to follow              -antipsychotic agents: N/A 5. Neuropsych: This patient is capable of making decisions on his own behalf. 6. Skin/Wound Care: pressure relief, protection to feet/heels 7. Fluids/Electrolytes/Nutrition: continue TF  -hyperkalemia: lokelma 5mg  daily x 2 doses, recheck labs Monday  2/1- see below under hyperkalemia  2/6- K+ 3.8- will monitor 8. A flutter s/p TAVR 2020: plan is to hold eliquis for now and resume in the next few  weeks potentially --HR controlled 1/29 1/30- hold eliquis- likely still bleeding- will call Cards in AM to see their plan.  9. Severe dysphagia w/aspiration: NPO with tube feeds for nutritonal support.  10. GIB: Monitor for signs of bleeding. Continue Protonix and Carafate. Holding Eliquis.  1/30- Hb 8.9- was 10.2- but previous was 8.9 so likely not bleeding severely, but will recheck labs in AM 1/31- Hb stable at 8.9- will monitor 2/1- GI maximized PPI to BID and maintained carafate- will also add FeSol solution BID for low iron levels 11. CKD 3a: BUN/SCr 124/2.07             --improved to 35/1.4 on 1/28  1/30- Cr 1.42 and BUN 34- overall stable- con't to monitor  -monitor output, labs Monday   1/31- Cr down slightly to 1.33 and BUN 32- con't to monitor  2/1- wil recheck in AM  2/2- Cr 1.26 and BUN down to 30- from 36- doing better- con't to monitor  2/3- Cr 1.34 and BUN 31- is chronic/baseline- will reduce dose of Keflex as a result.   2/6- BUN better at 29 and Cr stable at 1.34- con't to monitor 12. Constipation: Continue Senna daily. 2/2- LBM yesterday- is brown 2/6- LBM Friday- will give sorbitol today  13. HLD: continue atorvastatin 40mg  daily.  14. Hypothyroidism: continue Synthroid.  15. Hyperkalemia- likely due to TfF'- has PEG.   1/31- given multiple doses of Lokelma- down to 4.8- but will recheck again Thursday. 2/2- K+ 4.2- so doing much better on Nepro TF's  16. NPO-due to  esophageal stricture/severe dysphagia on TF's  1/31- 1 reason came into hospital with failure to thrive due to not eating/drinking- per chart review- SLP involved, but not sure if will be able to not use PEG.   2/2- failed MBS - cannot have anything by mouth  2/4: continue feeding via PEG 17. ABLA  2/1- will add Feosol solution BID to help with blood loss. 24: hgb 8/6 on 2/3, monitor weekly  18. Leukocytosis- UTI  2/2- Mild- WBC 11.1- will check CXR since is NPO and U/A and Cx to try and find source and recheck in AM  2/3- WBC up to 12.8- afebrile- however urine "so turbid couldn't run U/A"- has many bacteria- will recheck again in 3-4 days per d/w pharmacy- wil start Keflex 500 mg q12 hours- x 7 days- might need longer treatment, but will determine next week. Urine cloudy., odorous as well per nursing.    2/4: UC with >100,000 colonies citrobacter, f/u sensititivities  2/6- Change to Cefepime per d/w pharmacy- willl need 7 days- might need to extend him? Will check if can do anything PO?   I spent a total of 39    minutes on total care today- >50% coordination of care- due to  d/w pharmacy and team about extension?     LOS: 10 days A FACE TO FACE EVALUATION WAS PERFORMED  Arlander Gillen 03/20/2021, 6:48 PM

## 2021-03-20 NOTE — Discharge Summary (Incomplete)
Physician Discharge Summary  Patient ID: Gregory [name] MRN: [id number] DOB/AGE: 06-30-1920 88 y.o.  Admit date: 03/10/2021 Discharge date: 03/20/2021  Discharge Diagnoses:  Principal Problem:   Debility   Discharged Condition: {condition:18240}  Significant Diagnostic Studies: CT Head Wo Contrast  Result Date: 03/02/2021 CLINICAL DATA:  Dizziness. EXAM: CT HEAD WITHOUT CONTRAST TECHNIQUE: Contiguous axial images were obtained from the base of the skull through the vertex without intravenous contrast. RADIATION DOSE REDUCTION: This exam was performed according to the departmental dose-optimization program which includes automated exposure control, adjustment of the mA and/or kV according to patient size and/or use of iterative reconstruction technique. COMPARISON:  None. FINDINGS: Brain: No evidence of acute infarction, hemorrhage, hydrocephalus, extra-axial collection or mass lesion/mass effect. Old right cerebellar infarct. Moderate generalized cerebral atrophy, within normal limits for age. Scattered moderate periventricular and subcortical white matter hypodensities are nonspecific, but favored to reflect chronic microvascular ischemic changes. Vascular: Calcified atherosclerosis at the skull base. No hyperdense vessel. Skull: Normal. Negative for fracture or focal lesion. Sinuses/Orbits: No acute finding. Other: None. IMPRESSION: 1. No acute intracranial abnormality.  Old right cerebellar infarct. 2. Moderate atrophy and chronic microvascular ischemic changes. Electronically Signed   By: Titus Dubin M.D.   On: 03/02/2021 12:27   US RENAL  Result Date: 03/02/2021 CLINICAL DATA:  Acute kidney injury EXAM: RENAL / URINARY TRACT ULTRASOUND COMPLETE COMPARISON:  None. FINDINGS: Right Kidney: Renal measurements: 8.4 x 5.3 x 4 cm = volume: 92.1 ML. Echogenicity increased. Query poorly visualized cystic subcentimeter lesion within the superior pole of the right kidney. No mass or hydronephrosis  visualized. Left Kidney: Renal measurements: 9.6 x 5.6 x 3.8 cm = volume: 107 mL. Echogenicity increased. No mass or hydronephrosis visualized. Urinary bladder: Appears normal for degree of bladder distention. Other: None. IMPRESSION: Bilateral renal echogenicity suggestive of renal parenchymal disease. Electronically Signed   By: Iven Finn M.D.   On: 03/02/2021 17:41   DG CHEST PORT 1 VIEW  Result Date: 03/16/2021 CLINICAL DATA:  Leukocytosis.  History of hypertension. EXAM: PORTABLE CHEST 1 VIEW COMPARISON:  03/02/2021 FINDINGS: Left chest wall pacer device identified with leads in the right atrial appendage and right ventricle. Status post TAVR. Aortic atherosclerosis. Stable cardiomediastinal contours. Small pleural effusions are suspected. No airspace consolidation or interstitial edema. No acute osseous findings. IMPRESSION: Suspect small bilateral pleural effusions. Electronically Signed   By: Kerby Moors M.D.   On: 03/16/2021 09:21   DG Chest Port 1 View  Result Date: 03/02/2021 CLINICAL DATA:  Dizziness. EXAM: PORTABLE CHEST 1 VIEW COMPARISON:  07/25/2020 FINDINGS: No focal consolidation. No pleural effusion or pneumothorax. Heart and mediastinal contours are unremarkable. Cardiac pacemaker in satisfactory position. Prior TAVR. No acute osseous abnormality. IMPRESSION: No active disease. Electronically Signed   By: Kathreen Devoid M.D.   On: 03/02/2021 10:46    Labs:  Basic Metabolic Panel: Recent Labs  Lab 03/14/21 0518 03/16/21 0531 03/17/21 0454 03/20/21 0650  NA 136 134* 134* 136  K 4.8 4.2 3.8 3.8  CL 102 98 100 98  CO2 25 27 27 30   GLUCOSE 94 106* 95 95  BUN 32* 30* 31* 29*  CREATININE 1.33* 1.26* 1.34* 1.34*  CALCIUM 8.5* 9.0 8.9 9.2  PHOS 3.7  --   --   --     CBC: Recent Labs  Lab 03/16/21 0531 03/17/21 0454 03/20/21 0650  WBC 11.1* 12.8* 8.6  NEUTROABS 8.6* 9.7*  --   HGB 8.8* 8.6* 8.9*  HCT 27.2* 27.0* 28.7*  MCV 95.8 95.7 97.6  PLT 151 146* 179     CBG: Recent Labs  Lab 03/19/21 0605 03/19/21 1203 03/19/21 1703 03/19/21 2356 03/20/21 0516  GLUCAP 90 108* 100* 91 87    Brief HPI:   Gregory [name] is a 88 y.o. male ***   Hospital Course: Brigido Mera was admitted to rehab 03/10/2021 for inpatient therapies to consist of PT, ST and OT at least three hours five days a week. Past admission physiatrist, therapy team and rehab RN have worked together to provide customized collaborative inpatient rehab.   Blood pressures were monitored on TID basis and   Diabetes has been monitored with ac/hs CBG checks and SSI was use prn for tighter BS control.    Rehab course: During patient's stay in rehab weekly team conferences were held to monitor patient's progress, set goals and discuss barriers to discharge. At admission, patient required max assist with basic ADL Mod Assist with Mobility. He demonstrated generalized weakness during oral motor exam with delayed cough when consuming thin liquids and reduce signs and symptoms of aspiration during trials of thickened textures.  N.p.o. was recommended in addition to formal instrumental study was recommended for evaluation of swallow function. He  has had improvement in activity tolerance, balance, postural control as well as ability to compensate for deficits.     Disposition: Home  Diet: Nothing by mouth  Special Instructions: Perform oral care qid with mouthwash. Can take in  a few Ice chips after oral caure/mouth wash.   Allergies as of 03/20/2021       Reactions   Amlodipine Besylate    Other reaction(s): edema   Penicillins Hives, Rash     Med Rec must be completed prior to using this [facility]***       Follow-up Information     Lovorn, Jinny Blossom, MD. Call.   Specialty: Physical Medicine and Rehabilitation Why: As needed Contact information: [address] [place] [address] [phone]         Orpah Greek, DO Follow up.   Specialty:  Internal Medicine Why: call for follow up appointment/post hospital follow up Contact information: [place] [address] [phone]         [place], Peter M, MD .   Specialty: Cardiology Contact information: [address] [place] [address] [phone]         Mauri Pole, MD Follow up.   Specialty: Gastroenterology Contact information: [place] [address] [phone]                 Signed: Bary Leriche 03/20/2021, 11:09 AM

## 2021-03-20 NOTE — Progress Notes (Signed)
Speech Language Pathology Daily Session Note  Patient Details  Name: Magnus Crescenzo MRN: [id number] Date of Birth: [birth year]  Today's Date: 03/20/2021 SLP Individual Time: 4232-0094 SLP Individual Time Calculation (min): 45 min  Short Term Goals: Week 2: SLP Short Term Goal 1 (Week 2): STG=LTG due to ELOS  Skilled Therapeutic Interventions:Skilled ST services focused on education, swallow and cognitive skills. SLP facilitated completion of SLUMS cognitive linguistic assessment, pt scored 14/30 (n=>27) with primary deficits in short term recall. Pt and pt's daughter report being at cognitive baseline. SLP provided education pertaining to memory strategies and need for supervision A for safety awareness and problem solving. Pt's daughter agreed to supervision A and cognition goals have been met due to baseline function. Pt completed oral care with suction toothbrush given min A for thoroughness. Pt demonstrated recall of two swallow exercises with supervision A verbal cues. Pt completed CTAR x10 with min A verbal cues for accuracy while consuming x10 ice chip trials. Pt demonstrated x2 swallows to aid in clearing residue with supervision A verbal cues. Pt demonstrated x2 delayed throat clears during the last two trials, likely due to pharyngeal residue and/or fatigue. Pt completed EMST set at 19cm H20 set of 10 with supervision A verbal cues. Pt was left in room with daughter, call bell within reach and chair alarm set. SLP recommends to continue skilled services focusing on dysphagia.        Pain Pain Assessment Pain Score: 0-No pain  Therapy/Group: Individual Therapy  Sharilynn Cassity  [facility] 03/20/2021, 3:55 PM

## 2021-03-20 NOTE — Progress Notes (Signed)
Physical Therapy Session Note  Patient Details  Name: Gregory [name] MRN: [id number] Date of Birth: [birth year]  Today's Date: 03/20/2021 PT Individual Time: 1301-1326 PT Individual Time Calculation (min): 25 min   Short Term Goals: Week 1:  PT Short Term Goal 1 (Week 1): Pt will perform supine<>sit with min assist PT Short Term Goal 1 - Progress (Week 1): Met PT Short Term Goal 2 (Week 1): Pt will perform sit<>stand transfers using LRAD with min assist PT Short Term Goal 2 - Progress (Week 1): Met PT Short Term Goal 3 (Week 1): Pt will perform stand pivot transfers using LRAD with min assist PT Short Term Goal 3 - Progress (Week 1): Met PT Short Term Goal 4 (Week 1): Pt will ambulate at least 126f using LRAD with min assist PT Short Term Goal 4 - Progress (Week 1): Met PT Short Term Goal 5 (Week 1): Pt will navigate 4 stairs using HRs with min assist PT Short Term Goal 5 - Progress (Week 1): Met Week 2:  PT Short Term Goal 1 (Week 2): =LTG due to ELOS  Skilled Therapeutic Interventions/Progress Updates:   Received pt sitting in recliner with family present at bedside. Pt agreeable to PT treatment and denied any pain during session. Session with emphasis on functional mobility/transfers and simulated car transfers. Pt transferred recliner<>WC stand<>pivot with RW and min A to stand and CGA for actual transfer. [facility]. Pt performed simulated car transfer with RW and min A with cues for safe entry (to sit first, then swing legs in), however pt expressed "that's not how he does it" and "is going to do it his way" via side stepping in. Pt did require assist to get LEs into car. Sit<>stand with RW and heavy min A and ambulated 513fwith RW and CGA back to recliner. Concluded session with pt sitting in recliner with all needs within reach and family present at beside. Pt's family expressing frustration regarding 30 minute therapy  sessions, reporting that it is not enough time. Explained how daily therapy schedules vary and 3 hour rule.   Therapy Documentation Precautions:  Precautions Precautions: Fall Precaution Comments: NPO with PEG tube, posterior lean Restrictions Weight Bearing Restrictions: No  Therapy/Group: Individual Therapy AnAlfonse AlpersT, DPT   03/20/2021, 7:27 AM

## 2021-03-20 NOTE — Progress Notes (Signed)
Physical Therapy Session Note  Patient Details  Name: Gregory [name] MRN: [id number] Date of Birth: 1920/04/22  Today's Date: 03/20/2021 PT Individual Time: 1100-1155 PT Individual Time Calculation (min): 55 min   Short Term Goals: Week 2:  PT Short Term Goal 1 (Week 2): =LTG due to ELOS  Skilled Therapeutic Interventions/Progress Updates:    Pt received seated in recliner in room, agreeable to PT session. No complaints of pain, does report feeling fatigued as he just finished working with OT. Sit to stand with min A to RW initially. Stand pivot transfer to w/c with RW and CGA for balance. Static standing balance with RW in normal stance, Romberg stance, modified tandem stance with no UE support holding ball 2 x 30 sec each with CGA to min A, focus on use of ankle strategy to prevent posterior fall. Sit to stand x 10 reps to RW with CGA with focus on full LE and trunk extension in standing. Standing alt L/R 4" step-taps with progression to step-ups with RW and CGA for balance. Trial ambulation with no RW and just handrail in hallway x 120 ft with CGA for balance. Pt does report increase in fatigue and LE weakness following ambulation without RW. Educated pt regarding energy conservation and recommending continued use of RW at home. Ambulation x 150 ft with RW and CGA for balance. Pt returned to recliner and left seated in room with needs in reach, son present.  Therapy Documentation Precautions:  Precautions Precautions: Fall Precaution Comments: NPO with PEG tube, posterior lean Restrictions Weight Bearing Restrictions: No      Therapy/Group: Individual Therapy   Excell Seltzer, PT, DPT, CSRS  03/20/2021, 11:57 AM

## 2021-03-21 LAB — GLUCOSE, CAPILLARY
Glucose-Capillary: 106 mg/dL — ABNORMAL HIGH (ref 70–99)
Glucose-Capillary: 106 mg/dL — ABNORMAL HIGH (ref 70–99)
Glucose-Capillary: 95 mg/dL (ref 70–99)
Glucose-Capillary: 96 mg/dL (ref 70–99)

## 2021-03-21 MED ORDER — SORBITOL 70 % SOLN: 30.0000 mL | Freq: Once | Status: DC

## 2021-03-21 MED ORDER — SORBITOL 70 % SOLN
30.0000 mL | Freq: Once | Status: AC
  Administered 2021-03-21: 30 mL
  Filled 2021-03-21: qty 30

## 2021-03-21 NOTE — Progress Notes (Signed)
Occupational Therapy Session Note  Patient Details  Name: Gregory [name] MRN: [id number] Date of Birth: Mar 29, 1920  Today's Date: 03/21/2021 OT Individual Time: 7829-5621 OT Individual Time Calculation (min): 70 min    Short Term Goals: Week 2:  OT Short Term Goal 1 (Week 2): STG=LTG 2/2 ELOS (continued working to supervision goals)  Skilled Therapeutic Interventions/Progress Updates:    Pt resting in bed upon arrival and agreeable to getting OOB for therapy. Supine>sit EOB with supervision. Pt declined changing clothing this morning. Sit>stand from EOB and amb with RW to w/c with CGA. Pt initially engaged in [place] therex for general conditioning before engaging in standing activities and functional amb with RW. Therex included NuStep 7 mins level 4 and arm bike for 5 mins. 1# ankle weights on BLE and pt kicked large therapy ball 3x10 each LE. Amb 25'x3 with CGA. Pt fatigues easily and requires multiple rest breaks. Pt returned to room and amb with RW from doorway to recliner. Pt remained in recliner with all needs within reach. Son present.   Therapy Documentation Precautions:  Precautions Precautions: Fall Precaution Comments: NPO with PEG tube, posterior lean Restrictions Weight Bearing Restrictions: No Pain: Pain Assessment Pain Scale: 0-10 Pain Score: 0-No pain Faces Pain Scale: No hurt   Therapy/Group: Individual Therapy  Leroy Libman 03/21/2021, 11:00 AM

## 2021-03-21 NOTE — Progress Notes (Signed)
PROGRESS NOTE   Subjective/Complaints:   Pt reports feels better- had BM yesterday and doesn't feel full anymore-  BM had BEFORE got sorbitol- no BM since got the sorbitol.   Think that will try to keep pt until Monday- til finishes IV ABX, for UTI- since really needs 7 days and don't feel comfortable putting 88 yr old on Septra- which is the suggestion if does PO due to renal issues that can occur, esp in 88 yr old. He doesn't drink PO great.    ROS:   Pt denies SOB, abd pain, CP, N/V/C/D, and vision changes   Objective:   No results found. Recent Labs    03/20/21 0650  WBC 8.6  HGB 8.9*  HCT 28.7*  PLT 179   Recent Labs    03/20/21 0650  NA 136  K 3.8  CL 98  CO2 30  GLUCOSE 95  BUN 29*  CREATININE 1.34*  CALCIUM 9.2    Intake/Output Summary (Last 24 hours) at 03/21/2021 1014 Last data filed at 03/21/2021 0745 Gross per 24 hour  Intake --  Output 1000 ml  Net -1000 ml     Pressure Injury 03/03/21 Heel Left Deep Tissue Pressure Injury - Purple or maroon localized area of discolored intact skin or blood-filled blister due to damage of underlying soft tissue from pressure and/or shear. (Active)  03/03/21 1114  Location: Heel  Location Orientation: Left  Staging: Deep Tissue Pressure Injury - Purple or maroon localized area of discolored intact skin or blood-filled blister due to damage of underlying soft tissue from pressure and/or shear.  Wound Description (Comments):   Present on Admission: Yes    Physical Exam: Vital Signs Blood pressure 140/70, pulse 70, temperature 97.8 F (36.6 C), temperature source Oral, resp. rate 15, height 5\' 8"  (1.727 m), weight 107 kg, SpO2 95 %.     General: awake, alert, appropriate, sitting up slightly in bed; not wearing hearing aids; NAD HENT: conjugate gaze; oropharynx moist CV: regular rate; no JVD Pulmonary: CTA B/L; no W/R/R- good air movement GI: soft, flat,  but hypoactive; NT Psychiatric: appropriate Neurological: HOH- alert- less sleepy Ext: no clubbing, cyanosis, or edema Skin: foot/heel wounds- DTI-s covered with dressings-foam- C/D/I Neuro:  HOH but able comprehend when he's able to hear. No focal language issues. Mild dysarthria. Strength grossly 4/5 bilateral UE/LE's. No focal sensory abnl. No abnl tone.  Musculoskeletal: Full ROM, No pain with AROM or PROM in the neck, trunk, or extremities. Posture appropriate     Assessment/Plan: 1. Functional deficits which require 3+ hours per day of interdisciplinary therapy in a comprehensive inpatient rehab setting. Physiatrist is providing close team supervision and 24 hour management of active medical problems listed below. Physiatrist and rehab team continue to assess barriers to discharge/monitor patient progress toward functional and medical goals  Care Tool:  Bathing    Body parts bathed by patient: Right arm, Left arm, Chest, Buttocks, Front perineal area, Abdomen, Right upper leg, Left upper leg, Face   Body parts bathed by helper: Buttocks     Bathing assist Assist Level: Minimal Assistance - Patient > 75%     Upper Body Dressing/Undressing Upper body  dressing   What is the patient wearing?: Pull over shirt    Upper body assist Assist Level: Minimal Assistance - Patient > 75%    Lower Body Dressing/Undressing Lower body dressing      What is the patient wearing?: Incontinence brief, Pants     Lower body assist Assist for lower body dressing: Moderate Assistance - Patient 50 - 74%     Toileting Toileting    Toileting assist Assist for toileting: Moderate Assistance - Patient 50 - 74%     Transfers Chair/bed transfer  Transfers assist     Chair/bed transfer assist level: Contact Guard/Touching assist     Locomotion Ambulation   Ambulation assist   Ambulation activity did not occur: Safety/medical concerns  Assist level: Contact Guard/Touching  assist Assistive device: Walker-rolling Max distance: 150'   Walk 10 feet activity   Assist  Walk 10 feet activity did not occur: Safety/medical concerns  Assist level: Contact Guard/Touching assist Assistive device: Walker-rolling   Walk 50 feet activity   Assist Walk 50 feet with 2 turns activity did not occur: Safety/medical concerns  Assist level: Contact Guard/Touching assist Assistive device: Walker-rolling    Walk 150 feet activity   Assist Walk 150 feet activity did not occur: Safety/medical concerns  Assist level: Contact Guard/Touching assist Assistive device: Walker-rolling    Walk 10 feet on uneven surface  activity   Assist Walk 10 feet on uneven surfaces activity did not occur: Safety/medical concerns   Assist level: Minimal Assistance - Patient > 75% Assistive device: Walker-rolling   Wheelchair     Assist Is the patient using a wheelchair?: Yes Type of Wheelchair: Manual    Wheelchair assist level: Dependent - Patient 0%      Wheelchair 50 feet with 2 turns activity    Assist        Assist Level: Dependent - Patient 0%   Wheelchair 150 feet activity     Assist      Assist Level: Dependent - Patient 0%   Blood pressure 140/70, pulse 70, temperature 97.8 F (36.6 C), temperature source Oral, resp. rate 15, height 5\' 8"  (1.727 m), weight 107 kg, SpO2 95 %.  Medical Problem List and Plan: 1. Functional deficits secondary to debility             -patient may shower but PEG must be covered.              -ELOS/Goals: 2-3 weeks S-MinA             -d/c date 03/24/21- Continue CIR- PT, OT and SLP Team conference today- will try and move d/c to 2/13-  2.  Antithrombotics: -DVT/anticoagulation:  Mechanical: Sequential compression devices, below knee Bilateral lower extremities 1/31- although Hb is stable, pt still having black tarry stools- will consult Cards about Eliquis- I think it's concerning to restart right now, but  will let Cards decide.  - Cards and GI agree NO ELIQUIS or Blood thinners.              -antiplatelet therapy: N/A 3. Pain Management: Tylenol 4. Mood:  LCSW to follow              -antipsychotic agents: N/A 5. Neuropsych: This patient is capable of making decisions on his own behalf. 6. Skin/Wound Care: pressure relief, protection to feet/heels 7. Fluids/Electrolytes/Nutrition: continue TF  -hyperkalemia: lokelma 5mg  daily x 2 doses, recheck labs Monday  2/1- see below under hyperkalemia  2/6- K+ 3.8- will monitor  8. A flutter s/p TAVR 2020: plan is to hold eliquis for now and resume in the next few weeks potentially --HR controlled 1/29 1/30- hold eliquis- likely still bleeding- will call Cards in AM to see their plan.  9. Severe dysphagia w/aspiration: NPO with tube feeds for nutritonal support.  10. GIB: Monitor for signs of bleeding. Continue Protonix and Carafate. Holding Eliquis.  1/30- Hb 8.9- was 10.2- but previous was 8.9 so likely not bleeding severely, but will recheck labs in AM 1/31- Hb stable at 8.9- will monitor 2/1- GI maximized PPI to BID and maintained carafate- will also add FeSol solution BID for low iron levels 11. CKD 3a: BUN/SCr 124/2.07             --improved to 35/1.4 on 1/28  1/30- Cr 1.42 and BUN 34- overall stable- con't to monitor  -monitor output, labs Monday   1/31- Cr down slightly to 1.33 and BUN 32- con't to monitor  2/1- wil recheck in AM  2/2- Cr 1.26 and BUN down to 30- from 36- doing better- con't to monitor  2/3- Cr 1.34 and BUN 31- is chronic/baseline- will reduce dose of Keflex as a result.   2/6- BUN better at 29 and Cr stable at 1.34- con't to monitor 12. Constipation: Continue Senna daily. 2/2- LBM yesterday- is brown 2/6- LBM Friday- will give sorbitol today  2/7- LBM yesterday but Sorbitol hasn't worked yet- might need more- since BM yesterday was small-medium- will reorder Sorbitol again at 3pm 13. HLD: continue atorvastatin 40mg  daily.   14. Hypothyroidism: continue Synthroid.  15. Hyperkalemia- likely due to TfF'- has PEG.   1/31- given multiple doses of Lokelma- down to 4.8- but will recheck again Thursday. 2/2- K+ 4.2- so doing much better on Nepro TF's  16. NPO-due to esophageal stricture/severe dysphagia on TF's  1/31- 1 reason came into hospital with failure to thrive due to not eating/drinking- per chart review- SLP involved, but not sure if will be able to not use PEG.   2/2- failed MBS - cannot have anything by mouth  2/4: continue feeding via PEG  2/6- no issues with PEG- using abd binder 17. ABLA  2/1- will add Feosol solution BID to help with blood loss. 24: hgb 8/6 on 2/3, monitor weekly  18. Leukocytosis- UTI  2/2- Mild- WBC 11.1- will check CXR since is NPO and U/A and Cx to try and find source and recheck in AM  2/3- WBC up to 12.8- afebrile- however urine "so turbid couldn't run U/A"- has many bacteria- will recheck again in 3-4 days per d/w pharmacy- wil start Keflex 500 mg q12 hours- x 7 days- might need longer treatment, but will determine next week. Urine cloudy., odorous as well per nursing.    2/4: UC with >100,000 colonies citrobacter, f/u sensititivities  2/6- Change to Cefepime per d/w pharmacy- willl need 7 days- might need to extend him? Will check if can do anything PO?  2/7- pharm suggests Septra, but agrees that 88 yr old, not great choice- really needs IV ABX- will keep until 2/13   I spent a total of   53  minutes on total care today- >50% coordination of care- due to team conference, d/w pharm and PA about IV ABX- large discussion over this.      LOS: 11 days A FACE TO FACE EVALUATION WAS PERFORMED  Joshue Badal 03/21/2021, 10:14 AM

## 2021-03-21 NOTE — Progress Notes (Signed)
Patient slept well last night. Denies any pain. PEG tube insitu and irrigating well. Had a small -medium bowel movement on 03/20/21. Sorbitol administered last night. Patient complains he feels full. Bowel sounds present on all quadrants. Call bell within reach of patient. Safety maintained at all times

## 2021-03-21 NOTE — Patient Care Conference (Signed)
Inpatient RehabilitationTeam Conference and Plan of Care Update Date: 03/21/2021   Time: 11:11 AM    Patient Name: Gregory [name]      Medical Record Number: [id number]  Date of Birth: Jul 12, 1920 Sex: Male         Room/Bed: 4M12C/4M12C-01 Payor Info: Payor: MEDICARE / Plan: MEDICARE PART A AND B / Product Type: *No Product type* /    Admit Date/Time:  03/10/2021  5:21 PM  Primary Diagnosis:  [facility] Problems: Principal Problem:   Debility    Expected Discharge Date: Expected Discharge Date: 03/27/21  Team Members Present: Physician leading conference: Dr. Courtney Heys Social Worker Present: [name], [place] Nurse Present: Dorthula Nettles, RN PT Present: Excell Seltzer, PT OT Present: Roanna Epley, [place], OT SLP Present: Charolett Bumpers, SLP PPS Coordinator present : Gunnar Fusi, SLP     Current Status/Progress Goal Weekly Team Focus  Bowel/Bladder   Continent of B/B. LBM 03/20/21  Remain continent  assess toileting needs q 2hr and prn   Swallow/Nutrition/ Hydration   NPO, ice chips with ST  Supervision A  education, CTAR and EMST exercises, ice chips   ADL's   bathing/dressing-min A; functional transfers-CGA; fatigues quickly  supervision overall  activity tolerance, BADLs, tranfsers, educaiton, discharge planning   Mobility   min A sit to stand, CGA gait up to 180 ft with RW, min A stairs  Supervision overall, CGA stairs  endurance, balance, gait, stairs, family edu and d/c planning   Communication             Safety/Cognition/ Behavioral Observations  d/c cognitive goals pt and pt's daughter support at cognitive baseline  d/c at baseline      Pain   Denies Pain  Remain pain free.  Assess q 4hr, and prn   Skin   Left heel DTI. Necrotic/black toes  Wound healing without complications. No new skin breakdowns  Assess Q shift and prn     Discharge Planning:  D/c to home with his son Gillis Santa will provide 24/7 care. Pt on TF prior to  admission and will remain on TF at discharge. HHA- [facility] for HHPT/OT/SLP/aide/SN. Pt has hospital bed ordered through [facility] in place. Pt has food for TF at home already. Family likley to hire homecare agency to assist with patient care needs at discharge.   Team Discussion: IV antibiotics for pos. UTI. Due to severity of UTI, need to remain on IV antibiotics. Only had small BM yesterday, needs to have a good BM, Sorbitol ordered. Only use abdominal binder when up and OOB, not at HS. Medications to flow to gravity. Treating left heel DTI and un-stageable right and left toes appropriately.   Patient on target to meet rehab goals: yes, supervision goals. Currently contact guard overall. Ambulating > 150 ft with RW. Min/mod assist with stairs. Fatigues. Affect improved. Good progress with ADLS. Cognition at baseline per family.  *See Care Plan and progress notes for long and short-term goals.   Revisions to Treatment Plan:   Adjusting medications  Teaching Needs: Family education, skin/wound care, medication management, safety awareness, transfer/gait training, etc.   Current Barriers to Discharge: Wound care and Weight  Possible Resolutions to Barriers: Family education [facility] PT/OT/Nursing     Medical Summary Current Status: on IV ABX for UTI- needs 7 days- no pain; L heel DTI and toes as well; abd binder only to keep PEG tube out of the way  Barriers to Discharge: Medical stability;Home enviroment access/layout;Decreased  family/caregiver support;IV antibiotics;Wound care;Nutrition means;Other (comments);Weight  Barriers to Discharge Comments: NPO- cannot progress to diet; ; Possible Resolutions to [employer] Focus: will extend til 2/13- due to IV ABX and could progress a little more-very low endurance; progress occuring- fatigue main limiteation- affect- better- more interactive; at cognitive baseline- 14/30 on SLUMS- at baseline- doing ice chip trials-   Continued Need for  Acute Rehabilitation Level of Care: The patient requires daily medical management by a physician with specialized training in physical medicine and rehabilitation for the following reasons: Direction of a multidisciplinary physical rehabilitation program to maximize functional independence : Yes Medical management of patient stability for increased activity during participation in an intensive rehabilitation regime.: Yes Analysis of laboratory values and/or radiology reports with any subsequent need for medication adjustment and/or medical intervention. : Yes   I attest that I was present, lead the team conference, and concur with the assessment and plan of the team.   Cristi Loron 03/21/2021, 3:03 PM

## 2021-03-21 NOTE — Progress Notes (Signed)
Patient ID: Gregory [name], male   DOB: 1920/09/28, 88 y.o.   MRN: [id number]  SW met with pt in room to provide updates from team conference, and d/c date now 2/13.Pt states he is in agreement with d/c.  *SW later went by pt room and met pt dtr [name] to provide updates from team conference, and the change in d/c date to 2/13 to allow pt to complete antibiotics. SW reviewed patient d/c.   SW updated [name]/[facility] on changes in pt discharge date.   [name], MSW, [place] Office: [phone] Cell: [phone] Fax: [phone]

## 2021-03-21 NOTE — Progress Notes (Signed)
Speech Language Pathology Daily Session Note  Patient Details  Name: Gregory [name] MRN: [id number] Date of Birth: 1920-06-05  Today's Date: 03/21/2021 SLP Individual Time: 0855-0916 SLP Individual Time Calculation (min): 21 min  Short Term Goals: Week 2: SLP Short Term Goal 1 (Week 2): STG=LTG due to ELOS  Skilled Therapeutic Interventions:Skilled ST services focused on swallow skills. SLP facilitated oral care piror to PO ice chip trials. Pt completed CTAR x11 with min A verbal cues to demonstrate chin tuck and Mod I use of secondary swallows with x12 ice chip trials. Pt demonstrated x1 delay cough following large ice chip, but not noted on medium size ice chips. Pt completed EMST set at 19cm H20 set of 10 with continued strength, therefore SLP increased to 20cm H20 pt completed set of 10 with fading strength at the time. Pt was unable to self-rate stating " its fine" but reported no dizziness or lightheadedness. Pt also completed IMST set at 9cm H20 set of 10. Pt was left in room with call bell within reach and bed alarm set. SLP recommends to continue skilled services.     Pain Pain Assessment Pain Scale: 0-10 Pain Score: 0-No pain Faces Pain Scale: No hurt  Therapy/Group: Individual Therapy  Kanetra Ho  [facility] 03/21/2021, 11:19 AM

## 2021-03-21 NOTE — Progress Notes (Signed)
Physical Therapy Session Note  Patient Details  Name: Gregory [name] MRN: [id number] Date of Birth: July 18, 1920  Today's Date: 03/21/2021 PT Individual Time: 0800-0845; 1500-1600 PT Individual Time Calculation (min): 45 min and 60 min  Short Term Goals: Week 2:  PT Short Term Goal 1 (Week 2): =LTG due to ELOS  Skilled Therapeutic Interventions/Progress Updates:    Session 1: Pt received supine in bed asleep, arousable and agreeable to PT session. No complaints of pain. Supine to sit with Supervision with increased time needed due to just waking up. Assisted pt with donning abdominal binder to cover PEG tube. Sit to stand initially with min A to RW this session. Stand pivot transfer with RW to w/c with CGA. Sit to/from supine on real bed in therapy apartment to simulate home environment at Supervision level with use of bedrail. Per pt's son he does have a hospital bed at home and uses bedrail for bed mobility. Standing alt L/R 6" step-ups with B handrails and CGA for balance for LE strengthening. Ambulation x 150 ft with use of RW at CGA level, flexed trunk posture. Pt requests to return to bed at end of session, bed mobility Supervision. Pt left seated in bed with needs in reach, son present.  Session 2: Pt received seated in recliner in room, agreeable to PT session. No complaints of pain. Pt with urgent urge to urinate, requests to use urinal vs ambulate to toilet due to urgency. Sit to stand with min A to RW in order to pull down pants. Pt able to continently void while standing with urinal with assist needed to hold urinal in place and use of RW for balance. Assisted pt with brief change due to soiling of brief, dependent. Pt's daughter then present for remainder of session for initiation of family education. Demonstrated how pt is currently CGA to min A level for sit to stands to RW with occasional posterior LOB. Ambulation x 90 ft with RW and CGA. Pt's daughter asking about use of rollator due to  pt's impaired endurance. Trial gait with rollator x 90 ft with CGA for balance, max cueing for safe brake management and to safely sit down on rollator. Pt not comfortable with use of rollator and exhibits decreased safety and balance as compared to use of RW. Pt and family in agreement that he should continue use of RW upon d/c home. Discussed pt's home setup regarding stairs for entry. Pt has 5 STE via garage entrance with 1-2 handrails (family unsure) vs 2 steps at back door with no rails including an 8" step into the home. Navigation up/down 4 stairs with L handrail and min A for balance, some buckling of knees noted when ascending stairs. Will continue to assess safest method of stair navigation. Pt requests to return to bed at end of session due to fatigue. Sit to supine Supervision. Pt left seated in bed with needs in reach, bed alarm in place.  Therapy Documentation Precautions:  Precautions Precautions: Fall Precaution Comments: NPO with PEG tube, posterior lean Restrictions Weight Bearing Restrictions: No      Therapy/Group: Individual Therapy   Excell Seltzer, PT, DPT, CSRS  03/21/2021, 11:38 AM

## 2021-03-22 ENCOUNTER — Inpatient Hospital Stay (HOSPITAL_COMMUNITY): Payer: Medicare Other

## 2021-03-22 LAB — GLUCOSE, CAPILLARY
Glucose-Capillary: 108 mg/dL — ABNORMAL HIGH (ref 70–99)
Glucose-Capillary: 108 mg/dL — ABNORMAL HIGH (ref 70–99)
Glucose-Capillary: 110 mg/dL — ABNORMAL HIGH (ref 70–99)

## 2021-03-22 MED ORDER — FREE WATER
250.0000 mL | Freq: Four times a day (QID) | Status: DC
  Administered 2021-03-22 – 2021-03-28 (×24): 250 mL

## 2021-03-22 MED ORDER — NEPRO/CARBSTEADY PO LIQD
315.0000 mL | Freq: Three times a day (TID) | ORAL | Status: DC
  Administered 2021-03-22 – 2021-03-31 (×27): 315 mL

## 2021-03-22 NOTE — Progress Notes (Deleted)
Patient ID: Gregory [name], male   DOB: [birth year], 88 y.o.   MRN: [id number]  SW spoke with pt dtr [name] to clarify home care agencies. Confirms that she will be working with [facility]. SW informed will coordinate any needs they may have. SW reiterated will follow-up on Monday to confirm her father's discharge, and assured that medical staff would follow-up if there are any changes.   [name], MSW, [place] Office: [phone] Cell: [phone] Fax: [phone]

## 2021-03-22 NOTE — Progress Notes (Signed)
Physical Therapy Session Note  Patient Details  Name: Gregory [name] MRN: [id number] Date of Birth: 07/31/1920  Today's Date: 03/22/2021 PT Individual Time: 1100-1200 PT Individual Time Calculation (min): 60 min   Short Term Goals: Week 2:  PT Short Term Goal 1 (Week 2): =LTG due to ELOS  Skilled Therapeutic Interventions/Progress Updates:    Pt received seated in w/c in room, agreeable to PT session. No complaints of pain. Sit to stand with CGA to RW during session. Ambulation up to 150 ft with RW and CGA throughout session. Ascend/descend one 6" curb step with RW and CGA for balance. Attempt to stand without RW but with min HHA to navigate curb step with no AD to simulate home setup. Pt unable to stand without support from RW due to knees buckling. Discussed with pt that he would likely not be safe to enter his home through the back door as there is no handrail and he needs UE support. Pt in agreement. Ascend/descend 4 x 6" stairs with 2 handrails and min A for balance, some decrease in LE strength noted with occasional buckling of knees. Attempt to ascend stairs laterally with L handrail to simulate home environment, however pt unsafe to attempt this date due to LE weakness. Pt frequently mentions during session that his LE feel weaker this date and that they are going to give out on him. Standing mini-squats 2 x 10 reps with RW and CGA for balance with no buckling noted. Standing alt L/R 4" step-taps with progression to step-ups with RW and min A for balance. Pt exhibits good strength with step-ups, no buckling noted. Will continue to monitor LE weakness in a functional context. Pt returned to recliner in room due to fatigue. Pt missed 15 min of scheduled therapy session due to fatigue. Pt left seated in recliner with needs in reach.  Therapy Documentation Precautions:  Precautions Precautions: Fall Precaution Comments: NPO with PEG tube, posterior lean Restrictions Weight Bearing  Restrictions: No General: PT Amount of Missed Time (min): 15 Minutes PT Missed Treatment Reason: Patient fatigue      Therapy/Group: Individual Therapy   Excell Seltzer, PT, DPT, CSRS  03/22/2021, 12:50 PM

## 2021-03-22 NOTE — Progress Notes (Signed)
Occupational Therapy Session Note  Patient Details  Name: Gregory [name] MRN: [id number] Date of Birth: [birth year]  Today's Date: 03/22/2021 OT Individual Time: 1300-1400 OT Individual Time Calculation (min): 60 min    Short Term Goals: Week 1:  OT Short Term Goal 1 (Week 1): Pt will complete sit<stand from toilet with no more than Mod A OT Short Term Goal 1 - Progress (Week 1): Met OT Short Term Goal 2 (Week 1): Pt will complete UB dressing with Mod A OT Short Term Goal 2 - Progress (Week 1): Met OT Short Term Goal 3 (Week 1): Pt will complete 1/3 components of donning pants using AE or adaptive techniques as needed OT Short Term Goal 3 - Progress (Week 1): Met Week 2:  OT Short Term Goal 1 (Week 2): STG=LTG 2/2 ELOS (continued working to supervision goals)  Skilled Therapeutic Interventions/Progress Updates:    1:1 Pt received sitting in the recliner- reports his legs feel weaker today and "give way" sometimes - no reports of falls.  Engaged in exercises for overall conditioning, strengthening and activity tolerance/ endurance. Participated in modified OTAGO A exercises with contact guard for balance. With 1.5 lb weights to ankles. Seated Rest breaks provided as needed. Also performed UB exercises with 2.5 lb weighted ball in unsupported sitting position.   Participated in self care retraining in sitting in the recliner with focus on sit to stands, standing balance with and without UE support and endurance. Pt required A to button shirt and A with LB dressing (both threading and pulling up). Left resting in the chair with daughter present.   Therapy Documentation Precautions:  Precautions Precautions: Fall Precaution Comments: NPO with PEG tube, posterior lean Restrictions Weight Bearing Restrictions: No    Therapy/Group: Individual Therapy  Willeen Cass [facility] 03/22/2021, 2:37 PM

## 2021-03-22 NOTE — Progress Notes (Signed)
Occupational Therapy Session Note  Patient Details  Name: Gregory [name] MRN: [id number] Date of Birth: 1920-10-30  Today's Date: 03/22/2021 OT Individual Time: 0931-1000 OT Individual Time Calculation (min): 29 min    Short Term Goals: Week 2:  OT Short Term Goal 1 (Week 2): STG=LTG 2/2 ELOS (continued working to supervision goals)  Skilled Therapeutic Interventions/Progress Updates:  Pt greeted supine in bed agreeable to OT intervention. Session focus on BADL reeducation, functional mobility, dynamic standing balance and decreasing overall caregiver burden. Pt completed supine>sit with supervision. Pt completed ambulatory toilet transfer from EOB>toilet with Rw and CGA. MIN A for 3/3 toileting tasks pt only needing balance assist and MIN assist for clothing mgmt on R side, +BM. Pt ambulated to sink for oral care and hand hygiene with overall supervision. Pt ambulated back to w/c with rw and CGA. pt left seated in w/c with alarm belt activated and all needs within reach.                      Therapy Documentation Precautions:  Precautions Precautions: Fall Precaution Comments: NPO with PEG tube, posterior lean Restrictions Weight Bearing Restrictions: No  Pain: no pain reported during session     Therapy/Group: Individual Therapy  Corinne Ports [facility] 03/22/2021, 11:57 AM

## 2021-03-22 NOTE — Progress Notes (Signed)
Speech Language Pathology Weekly Progress and Session Note ° °Patient Details  °Name: Gregory [name] °MRN: [id number] °Date of Birth: 02/18/1920 ° °Beginning of progress report period: March 17, 2021 °End of progress report period: March 22, 2021 ° °Today's Date: 03/22/2021 °SLP Individual Time: 1420-1500 °SLP Individual Time Calculation (min): 40 min ° °Short Term Goals: °Week 2: SLP Short Term Goal 1 (Week 2): STG=LTG due to ELOS °SLP Short Term Goal 1 - Progress (Week 2): Progressing toward goal ° °  °New Short Term Goals: °Week 3: SLP Short Term Goal 1 (Week 3): STGs=LTGs due to ELOS ° °Weekly Progress Updates: Patient continues to make functional gains, therefore, patient's length of stay has been extended. Currently, patient remains NPO and is consuming trials of ice chips with intermittent overt s/s of aspiration. Patient is currently participating in RMT exercises  and pharyngeal strengthening exercises with overall supervision level verbal cues for accuracy. Patient is at his cognitive baseline per his family, therefore, all those goals have been met. Patient would benefit from continued skilled SLP intervention to maximize his swallowing function prior to discharge.  °  ° °Intensity: Minumum of 1-2 x/day, 30 to 90 minutes °Frequency: 3 to 5 out of 7 days °Duration/Length of Stay: 03/27/21 °Treatment/Interventions: Cognitive remediation/compensation;Cueing hierarchy;Environmental controls;Therapeutic Activities;Functional tasks;Patient/family education;Dysphagia/aspiration precaution training ° ° °Daily Session ° °Skilled Therapeutic Interventions:  Skilled treatment session focused on dysphagia goals. SLP facilitated session by providing overall supervision level verbal cues for patient to perform 25 repetitions of EMST and IMST exercises accurately. Patient performed oral care via the suction toothbrush with set-up assist and consumed trials of ice chips with overt throat clearing/cough noted in 50% of  trials. Patient able to orally expectorate mucous throughout trials. Recommend ongoing trials with SLP. Patient's daughter present and educated regarding performing RMT exercises at night X 1 with assistance from family. Patient left upright in recliner with alarm on and all needs within reach. Continue with current plan of care.    ° ° °Pain °No/Denies Pain  ° °Therapy/Group: Individual Therapy ° °,  °03/22/2021, 4:07 PM ° ° ° ° ° ° °

## 2021-03-22 NOTE — Progress Notes (Signed)
Nutrition Follow-up  DOCUMENTATION CODES:   Severe malnutrition in context of chronic illness  INTERVENTION:  Provide bolus feeds via PEG using Nepro formula at new goal volume of 315 ml (10.5 ounces) given TID. (Total of 4 cartons/day)   Free water flushes of 250 ml every 6 hours per tube.    Tube feeding regimen to provide 1701 kcal, 77 grams of protein, and 1690 ml free water.   NUTRITION DIAGNOSIS:   Severe Malnutrition related to chronic illness (dysphagia, afib) as evidenced by severe fat depletion, severe muscle depletion; ongoing  GOAL:   Patient will meet greater than or equal to 90% of their needs; met with TF  MONITOR:   TF tolerance, Skin, I & O's, Labs, Weight trends  REASON FOR ASSESSMENT:   New TF    ASSESSMENT:   88 year old male with history of A Flutter s/p ablation w/PPM, HTN, neuropathy, lung nodule, TVAR, HOH, dysphagia s/p G tube (placed 02/10/21) who was admitted on 03/02/21 with hypotension, dizziness, near syncope and hgb 5.9. FBOT positive with reports of dark stools as well as reports of Hgb of 7.0 and trauma to GI tract due to NGT per family. CIR recommended due to functional decline.  Pt continues to tolerate his tube feeding regimen. Discussed possible plans with Pam, PA to change formula back to Osmolite 1.5 as potassium levels have decreased. However plans for pt to maintain and continue on current Nepro formula. PA with concern previous formula of Osmolite 1.5 cal resulted/aided in hyperkalemia. Plans to continue on current Nepro formula in prevention of future increase in serum potassium. Pt to follow up as outpatient upon discharge to continue to assess adequacy of tube feeding formula and regimen. Discussed tube feeding regimen with Daughter. Daughter requests no half carton left over within the tube feeding regimen for home. RD to modify orders. Daughter additionally requests new weight measurement.   Labs and medications reviewed.   Diet  Order:   Diet Order             Diet NPO time specified Except for: Ice Chips  Diet effective now                   EDUCATION NEEDS:   Not appropriate for education at this time  Skin:  Skin Assessment: Reviewed RN Assessment Skin Integrity Issues:: DTI, Other (Comment) DTI: L heel Other: PI sacrum  Last BM:  2/6  Height:   Ht Readings from Last 1 Encounters:  03/10/21 _0  (1.727 m)    Weight:   Wt Readings from Last 1 Encounters:  03/22/21 105 kg   BMI:  Body mass index is 35.2 kg/m.  Estimated Nutritional Needs:   Kcal:  1650-1850  Protein:  75-90 grams  Fluid:  >/= 1.65 L/day  Corrin Parker, MS, RD, LDN RD pager number/after hours weekend pager number on Amion.

## 2021-03-22 NOTE — Progress Notes (Signed)
PROGRESS NOTE   Subjective/Complaints:  Pt reports slept OK- doesn't know how doing this Am, since I woke him up.   No new BM, even though got Sorbitol x2- last BM 2/6- before sorbitol-   ROS:   Pt denies SOB, abd pain, CP, N/V/(+)C/D, and vision changes   Objective:   No results found. Recent Labs    03/20/21 0650  WBC 8.6  HGB 8.9*  HCT 28.7*  PLT 179   Recent Labs    03/20/21 0650  NA 136  K 3.8  CL 98  CO2 30  GLUCOSE 95  BUN 29*  CREATININE 1.34*  CALCIUM 9.2    Intake/Output Summary (Last 24 hours) at 03/22/2021 1610 Last data filed at 03/22/2021 0600 Gross per 24 hour  Intake 0 ml  Output 1360 ml  Net -1360 ml     Pressure Injury 03/03/21 Heel Left Deep Tissue Pressure Injury - Purple or maroon localized area of discolored intact skin or blood-filled blister due to damage of underlying soft tissue from pressure and/or shear. (Active)  03/03/21 1114  Location: Heel  Location Orientation: Left  Staging: Deep Tissue Pressure Injury - Purple or maroon localized area of discolored intact skin or blood-filled blister due to damage of underlying soft tissue from pressure and/or shear.  Wound Description (Comments):   Present on Admission: Yes     Pressure Injury 03/10/21 Toe (Comment  which one) Anterior;Left;Distal Unstageable - Full thickness tissue loss in which the base of the injury is covered by slough (yellow, tan, gray, green or brown) and/or eschar (tan, brown or black) in the wound bed. (Active)  03/10/21 1730  Location: Toe (Comment  which one) (Left great toe at the tip.)  Location Orientation: Anterior;Left;Distal  Staging: Unstageable - Full thickness tissue loss in which the base of the injury is covered by slough (yellow, tan, gray, green or brown) and/or eschar (tan, brown or black) in the wound bed.  Wound Description (Comments): Black area at the tip of the left great toe.  Present on  Admission: Yes     Pressure Injury 03/10/21 Toe (Comment  which one) Anterior;Right;Distal Unstageable - Full thickness tissue loss in which the base of the injury is covered by slough (yellow, tan, gray, green or brown) and/or eschar (tan, brown or black) in the wound bed (Active)  03/10/21 1730  Location: Toe (Comment  which one) (Right great toe at the tip of the toe.)  Location Orientation: Anterior;Right;Distal  Staging: Unstageable - Full thickness tissue loss in which the base of the injury is covered by slough (yellow, tan, gray, green or brown) and/or eschar (tan, brown or black) in the wound bed.  Wound Description (Comments): Right great toe at the tip of the toe.  Present on Admission: Yes    Physical Exam: Vital Signs Blood pressure (!) 122/59, pulse 76, temperature 97.8 F (36.6 C), temperature source Oral, resp. rate 16, height 5\' 8"  (1.727 m), weight 105 kg, SpO2 93 %.      General: awake, alert, appropriate, supine in bed- just woke up; NAD HENT: conjugate gaze; oropharynx moist CV: regular rate; no JVD Pulmonary: CTA B/L; no W/R/R- good air  movement GI: soft, NT, ND, (+)BS Psychiatric: appropriate; very HOH Neurological: more alert, in spite of being sleepy;  Ext: no clubbing, cyanosis, or edema Skin: foot/heel wounds- DTI-s covered with dressings-foam- C/D/I Neuro:  [facility] but able comprehend when he's able to hear. No focal language issues. Mild dysarthria. Strength grossly 4/5 bilateral UE/LE's. No focal sensory abnl. No abnl tone.  Musculoskeletal: Full ROM, No pain with AROM or PROM in the neck, trunk, or extremities. Posture appropriate     Assessment/Plan: 1. Functional deficits which require 3+ hours per day of interdisciplinary therapy in a comprehensive inpatient rehab setting. Physiatrist is providing close team supervision and 24 hour management of active medical problems listed below. Physiatrist and rehab team continue to assess barriers to  discharge/monitor patient progress toward functional and medical goals  Care Tool:  Bathing    Body parts bathed by patient: Right arm, Left arm, Chest, Buttocks, Front perineal area, Abdomen, Right upper leg, Left upper leg, Face   Body parts bathed by helper: Buttocks     Bathing assist Assist Level: Minimal Assistance - Patient > 75%     Upper Body Dressing/Undressing Upper body dressing   What is the patient wearing?: Pull over shirt    Upper body assist Assist Level: Minimal Assistance - Patient > 75%    Lower Body Dressing/Undressing Lower body dressing      What is the patient wearing?: Incontinence brief, Pants     Lower body assist Assist for lower body dressing: Moderate Assistance - Patient 50 - 74%     Toileting Toileting    Toileting assist Assist for toileting: Moderate Assistance - Patient 50 - 74%     Transfers Chair/bed transfer  Transfers assist     Chair/bed transfer assist level: Contact Guard/Touching assist     Locomotion Ambulation   Ambulation assist   Ambulation activity did not occur: Safety/medical concerns  Assist level: Contact Guard/Touching assist Assistive device: Walker-rolling Max distance: 150'   Walk 10 feet activity   Assist  Walk 10 feet activity did not occur: Safety/medical concerns  Assist level: Contact Guard/Touching assist Assistive device: Walker-rolling   Walk 50 feet activity   Assist Walk 50 feet with 2 turns activity did not occur: Safety/medical concerns  Assist level: Contact Guard/Touching assist Assistive device: Walker-rolling    Walk 150 feet activity   Assist Walk 150 feet activity did not occur: Safety/medical concerns  Assist level: Contact Guard/Touching assist Assistive device: Walker-rolling    Walk 10 feet on uneven surface  activity   Assist Walk 10 feet on uneven surfaces activity did not occur: Safety/medical concerns   Assist level: Minimal Assistance - Patient  > 75% Assistive device: Walker-rolling   Wheelchair     Assist Is the patient using a wheelchair?: Yes Type of Wheelchair: Manual    Wheelchair assist level: Dependent - Patient 0%      Wheelchair 50 feet with 2 turns activity    Assist        Assist Level: Dependent - Patient 0%   Wheelchair 150 feet activity     Assist      Assist Level: Dependent - Patient 0%   Blood pressure (!) 122/59, pulse 76, temperature 97.8 F (36.6 C), temperature source Oral, resp. rate 16, height 5\' 8"  (1.727 m), weight 105 kg, SpO2 93 %.  Medical Problem List and Plan: 1. Functional deficits secondary to debility             -patient may shower  but PEG must be covered.              -ELOS/Goals: 2-3 weeks S-MinA             -d/c date 03/24/21- Continue CIR- PT, OT and SLP Doing ice chips with SLP- changed d/c to 2/13 due to IV ABX and progress to increase level of function 2.  Antithrombotics: -DVT/anticoagulation:  Mechanical: Sequential compression devices, below knee Bilateral lower extremities 1/31- although Hb is stable, pt still having black tarry stools- will consult Cards about Eliquis- I think it's concerning to restart right now, but will let Cards decide.  - Cards and GI agree NO ELIQUIS or Blood thinners.              -antiplatelet therapy: N/A 3. Pain Management: Tylenol 4. Mood:  LCSW to follow              -antipsychotic agents: N/A 5. Neuropsych: This patient is capable of making decisions on his own behalf. 6. Skin/Wound Care: pressure relief, protection to feet/heels 7. Fluids/Electrolytes/Nutrition: continue TF  -hyperkalemia: lokelma 5mg  daily x 2 doses, recheck labs Monday  2/1- see below under hyperkalemia  2/6- K+ 3.8- will monitor 8. A flutter s/p TAVR 2020: plan is to hold eliquis for now and resume in the next few weeks potentially --HR controlled 1/29 1/30- hold eliquis- likely still bleeding- will call Cards in AM to see their plan.  9. Severe  dysphagia w/aspiration: NPO with tube feeds for nutritonal support.  10. GIB: Monitor for signs of bleeding. Continue Protonix and Carafate. Holding Eliquis.  1/30- Hb 8.9- was 10.2- but previous was 8.9 so likely not bleeding severely, but will recheck labs in AM 1/31- Hb stable at 8.9- will monitor 2/1- GI maximized PPI to BID and maintained carafate- will also add FeSol solution BID for low iron levels 11. CKD 3a: BUN/SCr 124/2.07             --improved to 35/1.4 on 1/28  1/30- Cr 1.42 and BUN 34- overall stable- con't to monitor  -monitor output, labs Monday   1/31- Cr down slightly to 1.33 and BUN 32- con't to monitor  2/1- wil recheck in AM  2/2- Cr 1.26 and BUN down to 30- from 36- doing better- con't to monitor  2/3- Cr 1.34 and BUN 31- is chronic/baseline- will reduce dose of Keflex as a result.   2/6- BUN better at 29 and Cr stable at 1.34- con't to monitor 12. Constipation: Continue Senna daily. 2/2- LBM yesterday- is brown 2/6- LBM Friday- will give sorbitol today  2/7- LBM yesterday but Sorbitol hasn't worked yet- might need more- since BM yesterday was small-medium- will reorder Sorbitol again at 3pm 13. HLD: continue atorvastatin 40mg  daily.  14. Hypothyroidism: continue Synthroid.  15. Hyperkalemia- likely due to TfF'- has PEG.   1/31- given multiple doses of Lokelma- down to 4.8- but will recheck again Thursday. 2/2- K+ 4.2- so doing much better on Nepro TF's  16. NPO-due to esophageal stricture/severe dysphagia on TF's  1/31- 1 reason came into hospital with failure to thrive due to not eating/drinking- per chart review- SLP involved, but not sure if will be able to not use PEG.   2/2- failed MBS - cannot have anything by mouth  2/4: continue feeding via PEG  2/6- no issues with PEG- using abd binder  2/8- doesn't need abd binder when In bed- only when up 17. ABLA  2/1- will add Feosol solution BID  to help with blood loss. 24: hgb 8/6 on 2/3, monitor weekly  18.  Leukocytosis- UTI  2/2- Mild- WBC 11.1- will check CXR since is NPO and U/A and Cx to try and find source and recheck in AM  2/3- WBC up to 12.8- afebrile- however urine "so turbid couldn't run U/A"- has many bacteria- will recheck again in 3-4 days per d/w pharmacy- wil start Keflex 500 mg q12 hours- x 7 days- might need longer treatment, but will determine next week. Urine cloudy., odorous as well per nursing.    2/4: UC with >100,000 colonies citrobacter, f/u sensititivities  2/6- Change to Cefepime per d/w pharmacy- willl need 7 days- might need to extend him? Will check if can do anything PO?  2/7- pharm suggests Septra, but agrees that 88 yr old, not great choice- really needs IV ABX- will keep until 2/13  2/8- con't IV ABX until Sunday- recheck labs monday 19. Constipation  2/8- will check KUB and treat as required base don results.    I spent a total of  35  minutes on total care today- >50% coordination of care- due to  possible ileus/constipation, NPO and decision on IV ABX- d/w pharmacy again and team.        LOS: 12 days A FACE TO FACE EVALUATION WAS PERFORMED  Aliviyah Malanga 03/22/2021, 8:22 AM

## 2021-03-22 NOTE — Progress Notes (Signed)
Slept well last night. No new changes to report. PEG tube insitu and remains patent . CBG monitoring continues. Metrics within appreciable range. Assisted with bathroom privileges. Safety maintained at all times

## 2021-03-23 LAB — GLUCOSE, CAPILLARY
Glucose-Capillary: 89 mg/dL (ref 70–99)
Glucose-Capillary: 105 mg/dL — ABNORMAL HIGH (ref 70–99)
Glucose-Capillary: 113 mg/dL — ABNORMAL HIGH (ref 70–99)

## 2021-03-23 MED ORDER — LIP MEDEX EX OINT
TOPICAL_OINTMENT | CUTANEOUS | Status: DC | PRN
  Filled 2021-03-23: qty 7

## 2021-03-23 MED ORDER — SULFAMETHOXAZOLE-TRIMETHOPRIM 800-160 MG PO TABS
1.0000 | ORAL_TABLET | Freq: Every day | ORAL | Status: AC
  Administered 2021-03-23 – 2021-03-26 (×4): 1 via ORAL
  Filled 2021-03-23 (×4): qty 1

## 2021-03-23 NOTE — Progress Notes (Signed)
Occupational Therapy Note  Patient Details  Name: Gregory [name] MRN: [id number] Date of Birth: August 26, 1920  Today's Date: 03/23/2021 OT Missed Time: 72 Minutes Missed Time Reason: Other (comment);Patient ill (comment) (pt with increased tremors and "not feeling well")  Pt resting in recliner upon arrival. Pt continues to c/o increased weakness and tremors. MD aware. Pt unable to participate in therapy. Pt missed 60 mins skilled OT services. Will check back as time allows.   Leotis Shames [facility] 03/23/2021, 1:28 PM

## 2021-03-23 NOTE — Progress Notes (Addendum)
PROGRESS NOTE   Subjective/Complaints:  Pt reports LBM yesterday.  Slept OK.   Per nursing, pt had muscle spasms in legs yesterday which is new- never had before- also c/o legs feeling weaker- and per OT, legs "gave out on him" this AM- however didn't fall- needed to be lowered to chair.   Denies pain. Lips dry.   ROS:   Pt denies SOB, abd pain, CP, N/V/C/D, and vision changes   Objective:   DG Abd 1 View  Result Date: 03/22/2021 CLINICAL DATA:  Constipation. EXAM: ABDOMEN - 1 VIEW COMPARISON:  None. FINDINGS: Gastrostomy tube is seen over the left upper quadrant in expected position of the stomach. No abnormal bowel dilatation is noted. Moderate amount of stool seen throughout the colon. IMPRESSION: Moderate stool burden.  No abnormal bowel dilatation. Electronically Signed   By: Marijo Conception M.D.   On: 03/22/2021 09:00   No results for input(s): WBC, HGB, HCT, PLT in the last 72 hours.  No results for input(s): NA, K, CL, CO2, GLUCOSE, BUN, CREATININE, CALCIUM in the last 72 hours.   Intake/Output Summary (Last 24 hours) at 03/23/2021 0919 Last data filed at 03/23/2021 8299 Gross per 24 hour  Intake --  Output 700 ml  Net -700 ml     Pressure Injury 03/03/21 Heel Left Deep Tissue Pressure Injury - Purple or maroon localized area of discolored intact skin or blood-filled blister due to damage of underlying soft tissue from pressure and/or shear. (Active)  03/03/21 1114  Location: Heel  Location Orientation: Left  Staging: Deep Tissue Pressure Injury - Purple or maroon localized area of discolored intact skin or blood-filled blister due to damage of underlying soft tissue from pressure and/or shear.  Wound Description (Comments):   Present on Admission: Yes     Pressure Injury 03/10/21 Toe (Comment  which one) Anterior;Left;Distal Unstageable - Full thickness tissue loss in which the base of the injury is covered by  slough (yellow, tan, gray, green or brown) and/or eschar (tan, brown or black) in the wound bed. (Active)  03/10/21 1730  Location: Toe (Comment  which one) (Left great toe at the tip.)  Location Orientation: Anterior;Left;Distal  Staging: Unstageable - Full thickness tissue loss in which the base of the injury is covered by slough (yellow, tan, gray, green or brown) and/or eschar (tan, brown or black) in the wound bed.  Wound Description (Comments): Black area at the tip of the left great toe.  Present on Admission: Yes     Pressure Injury 03/10/21 Toe (Comment  which one) Anterior;Right;Distal Unstageable - Full thickness tissue loss in which the base of the injury is covered by slough (yellow, tan, gray, green or brown) and/or eschar (tan, brown or black) in the wound bed (Active)  03/10/21 1730  Location: Toe (Comment  which one) (Right great toe at the tip of the toe.)  Location Orientation: Anterior;Right;Distal  Staging: Unstageable - Full thickness tissue loss in which the base of the injury is covered by slough (yellow, tan, gray, green or brown) and/or eschar (tan, brown or black) in the wound bed.  Wound Description (Comments): Right great toe at the tip of the  toe.  Present on Admission: Yes    Physical Exam: Vital Signs Blood pressure (!) 121/52, pulse 70, temperature 97.6 F (36.4 C), resp. rate 18, height 5\' 8"  (1.727 m), weight 105 kg, SpO2 93 %.       General: awake, alert, appropriate, laying supine in bed; OTA in room;  NAD HENT: conjugate gaze; oropharynx moist CV: regular rate; no JVD Pulmonary: CTA B/L; no W/R/R- good air movement GI: soft, NT, ND, (+)BS Psychiatric: appropriate Neurological: HOH- alert- better than yesterday  Ext: no clubbing, cyanosis, or edema Skin: foot/heel wounds- DTI-s covered with dressings-foam- C/D/I Neuro:  HOH but able comprehend when he's able to hear. No focal language issues. Mild dysarthria. Strength grossly 4/5 bilateral  UE/LE's. No major change noted today  No focal sensory abnl. No abnl tone.  Musculoskeletal: Full ROM, No pain with AROM or PROM in the neck, trunk, or extremities. Posture appropriate     Assessment/Plan: 1. Functional deficits which require 3+ hours per day of interdisciplinary therapy in a comprehensive inpatient rehab setting. Physiatrist is providing close team supervision and 24 hour management of active medical problems listed below. Physiatrist and rehab team continue to assess barriers to discharge/monitor patient progress toward functional and medical goals  Care Tool:  Bathing    Body parts bathed by patient: Right arm, Left arm, Chest, Buttocks, Front perineal area, Abdomen, Right upper leg, Left upper leg, Face   Body parts bathed by helper: Buttocks     Bathing assist Assist Level: Minimal Assistance - Patient > 75%     Upper Body Dressing/Undressing Upper body dressing   What is the patient wearing?: Pull over shirt    Upper body assist Assist Level: Minimal Assistance - Patient > 75%    Lower Body Dressing/Undressing Lower body dressing      What is the patient wearing?: Incontinence brief, Pants     Lower body assist Assist for lower body dressing: Moderate Assistance - Patient 50 - 74%     Toileting Toileting    Toileting assist Assist for toileting: Minimal Assistance - Patient > 75%     Transfers Chair/bed transfer  Transfers assist     Chair/bed transfer assist level: Contact Guard/Touching assist     Locomotion Ambulation   Ambulation assist   Ambulation activity did not occur: Safety/medical concerns  Assist level: Contact Guard/Touching assist Assistive device: Walker-rolling Max distance: 150'   Walk 10 feet activity   Assist  Walk 10 feet activity did not occur: Safety/medical concerns  Assist level: Contact Guard/Touching assist Assistive device: Walker-rolling   Walk 50 feet activity   Assist Walk 50 feet with 2  turns activity did not occur: Safety/medical concerns  Assist level: Contact Guard/Touching assist Assistive device: Walker-rolling    Walk 150 feet activity   Assist Walk 150 feet activity did not occur: Safety/medical concerns  Assist level: Contact Guard/Touching assist Assistive device: Walker-rolling    Walk 10 feet on uneven surface  activity   Assist Walk 10 feet on uneven surfaces activity did not occur: Safety/medical concerns   Assist level: Minimal Assistance - Patient > 75% Assistive device: Walker-rolling   Wheelchair     Assist Is the patient using a wheelchair?: Yes Type of Wheelchair: Manual    Wheelchair assist level: Dependent - Patient 0%      Wheelchair 50 feet with 2 turns activity    Assist        Assist Level: Dependent - Patient 0%   Wheelchair 150  feet activity     Assist      Assist Level: Dependent - Patient 0%   Blood pressure (!) 121/52, pulse 70, temperature 97.6 F (36.4 C), resp. rate 18, height 5\' 8"  (1.727 m), weight 105 kg, SpO2 93 %.  Medical Problem List and Plan: 1. Functional deficits secondary to debility             -patient may shower but PEG must be covered.              -ELOS/Goals: 2-3 weeks S-MinA             -d/c date 03/27/21 Doing ice chips with SLP- changed d/c to 2/13 due to IV ABX and progress to increase level of function 2/9- pt c/o legs feeling like will give out and muscle spasms- sounds like overdid with therapy? Not clear- if doesn't improve by tomorrow, might need to scan pt's back? Don't see any reason for these symptoms except muscle fatigue? 2.  Antithrombotics: -DVT/anticoagulation:  Mechanical: Sequential compression devices, below knee Bilateral lower extremities 1/31- although Hb is stable, pt still having black tarry stools- will consult Cards about Eliquis- I think it's concerning to restart right now, but will let Cards decide.  - Cards and GI agree NO ELIQUIS or Blood  thinners.              -antiplatelet therapy: N/A 3. Pain Management: Tylenol 4. Mood:  LCSW to follow              -antipsychotic agents: N/A 5. Neuropsych: This patient is capable of making decisions on his own behalf. 6. Skin/Wound Care: pressure relief, protection to feet/heels 7. Fluids/Electrolytes/Nutrition: continue TF  -hyperkalemia: lokelma 5mg  daily x 2 doses, recheck labs Monday  2/1- see below under hyperkalemia  2/6- K+ 3.8- will monitor 8. A flutter s/p TAVR 2020: plan is to hold eliquis for now and resume in the next few weeks potentially --HR controlled 1/29 1/30- hold eliquis- likely still bleeding- will call Cards in AM to see their plan.  9. Severe dysphagia w/aspiration: NPO with tube feeds for nutritonal support.  10. GIB: Monitor for signs of bleeding. Continue Protonix and Carafate. Holding Eliquis.  1/30- Hb 8.9- was 10.2- but previous was 8.9 so likely not bleeding severely, but will recheck labs in AM 1/31- Hb stable at 8.9- will monitor 2/1- GI maximized PPI to BID and maintained carafate- will also add FeSol solution BID for low iron levels 11. CKD 3a: BUN/SCr 124/2.07             --improved to 35/1.4 on 1/28  1/30- Cr 1.42 and BUN 34- overall stable- con't to monitor  -monitor output, labs Monday   1/31- Cr down slightly to 1.33 and BUN 32- con't to monitor  2/1- wil recheck in AM  2/2- Cr 1.26 and BUN down to 30- from 36- doing better- con't to monitor  2/3- Cr 1.34 and BUN 31- is chronic/baseline- will reduce dose of Keflex as a result.   2/6- BUN better at 29 and Cr stable at 1.34- con't to monitor 12. Constipation: Continue Senna daily. 2/2- LBM yesterday- is brown 2/6- LBM Friday- will give sorbitol today  2/7- LBM yesterday but Sorbitol hasn't worked yet- might need more- since BM yesterday was small-medium- will reorder Sorbitol again at 3pm 2/9- LBM yesterday- KUB shows moderate stool burden, but had BM after that.  13. HLD: continue atorvastatin  40mg  daily.  14. Hypothyroidism: continue Synthroid.  15. Hyperkalemia- likely due to TfF'- has PEG.   1/31- given multiple doses of Lokelma- down to 4.8- but will recheck again Thursday. 2/2- K+ 4.2- so doing much better on Nepro TF's  16. NPO-due to esophageal stricture/severe dysphagia on TF's  1/31- 1 reason came into hospital with failure to thrive due to not eating/drinking- per chart review- SLP involved, but not sure if will be able to not use PEG.   2/2- failed MBS - cannot have anything by mouth  2/4: continue feeding via PEG  2/6- no issues with PEG- using abd binder  2/8- doesn't need abd binder when In bed- only when up 17. ABLA  2/1- will add Feosol solution BID to help with blood loss. 24: hgb 8/6 on 2/3, monitor weekly  18. Leukocytosis- UTI  2/2- Mild- WBC 11.1- will check CXR since is NPO and U/A and Cx to try and find source and recheck in AM  2/3- WBC up to 12.8- afebrile- however urine "so turbid couldn't run U/A"- has many bacteria- will recheck again in 3-4 days per d/w pharmacy- wil start Keflex 500 mg q12 hours- x 7 days- might need longer treatment, but will determine next week. Urine cloudy., odorous as well per nursing.    2/4: UC with >100,000 colonies citrobacter, f/u sensititivities  2/6- Change to Cefepime per d/w pharmacy- willl need 7 days- might need to extend him? Will check if can do anything PO?  2/7- pharm suggests Septra, but agrees that 88 yr old, not great choice- really needs IV ABX- will keep until 2/13  2/8- con't IV ABX until Sunday- recheck labs monday 19. Constipation  2/8- will check KUB and treat as required base don results. 2/9- as above.     I spent a total of  55  minutes on total care today- >50% coordination of care- due to d/w Nursing as well as PA and OT about muscle spasms as well as "legs giving out". Also d/w pharmacy and pt's son and daughter at different times   Addendum- appears muscle spasms are myoclonus- likely from  Cefipime- will stop and start Septra per pharmacy- will check Labs tomorrow and Sunday to make sure renal issues OK while on Septra.      LOS: 13 days A FACE TO FACE EVALUATION WAS PERFORMED  Damel Querry 03/23/2021, 9:19 AM

## 2021-03-23 NOTE — Progress Notes (Signed)
Speech Language Pathology Daily Session Note  Patient Details  Name: Gregory [name] MRN: [id number] Date of Birth: [birth year]  Today's Date: 03/23/2021 SLP Individual Time: 1100-1125 SLP Individual Time Calculation (min): 25 min  Short Term Goals: Week 3: SLP Short Term Goal 1 (Week 3): STGs=LTGs due to ELOS  Skilled Therapeutic Interventions: Skilled treatment session focused on dysphagia goals. Upon arrival, patient appeared lethargic while upright in the recliner. Patient appeared mildly down and reported feeling weaker, patient reported due to antibiotics (per physician). With extra time, patient performed 25 repetitions of IMST and EMST exercises with overall Min verbal cues for accuracy. Ice chips were not offered due to fatigue and reports of feeling weaker. Patient left upright in recliner with alarm on and all needs within reach. Continue with current plan of care.      Pain No/Denies Pain   Therapy/Group: Individual Therapy  Athaliah Baumbach 03/23/2021, 12:10 PM

## 2021-03-23 NOTE — Progress Notes (Signed)
Physical Therapy Session Note  Patient Details  Name: Gregory [name] MRN: [id number] Date of Birth: [birth year]  Today's Date: 03/23/2021 PT Individual Time: 0920-1015 PT Individual Time Calculation (min): 55 min   Short Term Goals: Week 2:  PT Short Term Goal 1 (Week 2): =LTG due to ELOS  Skilled Therapeutic Interventions/Progress Updates: Pt presented in recliner with nsg present. Pt missed 20 min PT due to nsg providing meds via PEG tube. OT indicated increased weakness requiring maxA squat pivot transfer to recliner. Pt indicated does feel weaker and noted increased tremors as compared to previous sessions with this therapist. Pt was able to perform BLE therex including ankle pumps, LAQ, hip flexion, hip abd/add, hip ER with red theraband and hamstring pulls with red theraband. Pt was able to perform all activities against resistance however at times uncoordinated and jerky movements noted. Pt performed Sit to stand in [place] for safety and transferred to w/c. Pt transported to ortho gym and performed Steady transfer to high/low mat. Pt was able to perform Sit to stand x 2 from elevated mat and able to perform standing marches x 10 with RW and CGA with no buckling noted. Pt indicated felt like it was "going to give" at end of marching but remained stable. During seated rest pt noted to have increased tremors/jerking through trunk and UE requiring assist from PTA for stabilization. Pt alert and aware stating he "has no control over movement". Pt attempted to x 2 more stands however increased jerkiness and PTA felt it was unsafe to attempt stand pivot transfer therefore Stedy transfer back to w/c. Upon returning to room pt requesting to use bathroom. PTA used Stedy to transfer to toilet and pt required minA for clothing management (+BM/void). Pt required total A for peri-care and modA for clothing management. Pt transferred via Stedy to recliner. Pt left in recliner at end of session with seat alarm on,  call bell within reach and son present. Nsg notified of pt's increased tremors and PTA discussed with MD after session.      Therapy Documentation Precautions:  Precautions Precautions: Fall Precaution Comments: NPO with PEG tube, posterior lean Restrictions Weight Bearing Restrictions: No General: PT Amount of Missed Time (min): 20 Minutes PT Missed Treatment Reason: Nursing care Vital Signs: Therapy Vitals Temp: 98.1 F (36.7 C) Pulse Rate: 70 Resp: 16 BP: (!) 105/46 Patient Position (if appropriate): Sitting Oxygen Therapy SpO2: 100 % O2 Device: Room Air Pain:   Mobility:   Locomotion :    Trunk/Postural Assessment :    Balance:   Exercises:   Other Treatments:      Therapy/Group: Individual Therapy  [name] 03/23/2021, 4:02 PM

## 2021-03-23 NOTE — Progress Notes (Signed)
Patient slept well though out the night. No c/o pain. Family concerned about some muscle spasms patient had yesterday in his lower extremities. Nurse explained to family it may be from over working muscles with therapy. She would like MD to follow up with him. No other muscle spasms noted through out the night.

## 2021-03-23 NOTE — Progress Notes (Signed)
Patient ID: Gregory [name], male   DOB: May 25, 1920, 88 y.o.   MRN: [id number]  SW spoke with pt dtr [name] to clarify home care agencies. Confirms that she will be working with [facility]. SW informed will coordinate any needs they may have. SW reiterated will follow-up on Monday to confirm her father's discharge, and assured that medical staff would follow-up if there are any changes.   [name], MSW, [place] Office: [phone] Cell: [phone] Fax: [phone]

## 2021-03-23 NOTE — Progress Notes (Signed)
Occupational Therapy Session Note  Patient Details  Name: Gregory [name] MRN: [id number] Date of Birth: 01/30/1921  Today's Date: 03/23/2021 OT Individual Time: 0700-0812 OT Individual Time Calculation (min): 72 min    Short Term Goals: Week 2:  OT Short Term Goal 1 (Week 2): STG=LTG 2/2 ELOS (continued working to supervision goals)  Skilled Therapeutic Interventions/Progress Updates:    Pt sleeping in bed upon arrival but easily aroused. Supine>sit EOB with CGA. Pt amb with RW from bed to w/c at sink with CGA. Sit<>stand with CGA. Pt commented that yesterday afternoon, his legs were weaker and would "give out" on him. Pt completed grooming at sink with assistance for shaving. Pt transported (time mgmt) to day room. Sit<>stand attempted x4 with pt unable to come to upright position. Pt's BLE "gave out" and pt sat back into w/c. Returned to room and attempted to amb with RW to recliner but unable. Max A squat pivot transfer to recliner. All needs within reach and son present.   Therapy Documentation Precautions:  Precautions Precautions: Fall Precaution Comments: NPO with PEG tube, posterior lean Restrictions Weight Bearing Restrictions: No  Pain:  Pt denies pain this morning   Therapy/Group: Individual Therapy  Leroy Libman 03/23/2021, 8:15 AM

## 2021-03-24 LAB — BASIC METABOLIC PANEL
Anion gap: 7 (ref 5–15)
BUN: 35 mg/dL — ABNORMAL HIGH (ref 8–23)
CO2: 31 mmol/L (ref 22–32)
Calcium: 9.1 mg/dL (ref 8.9–10.3)
Chloride: 98 mmol/L (ref 98–111)
Creatinine, Ser: 1.33 mg/dL — ABNORMAL HIGH (ref 0.61–1.24)
GFR, Estimated: 48 mL/min — ABNORMAL LOW (ref >60)
Glucose, Bld: 98 mg/dL (ref 70–99)
Potassium: 3.9 mmol/L (ref 3.5–5.1)
Sodium: 136 mmol/L (ref 135–145)

## 2021-03-24 LAB — GLUCOSE, CAPILLARY
Glucose-Capillary: 115 mg/dL — ABNORMAL HIGH (ref 70–99)
Glucose-Capillary: 123 mg/dL — ABNORMAL HIGH (ref 70–99)

## 2021-03-24 LAB — AMMONIA: Ammonia: 15 umol/L (ref 9–35)

## 2021-03-24 MED ORDER — LEVETIRACETAM 250 MG PO TABS
250.0000 mg | ORAL_TABLET | Freq: Two times a day (BID) | ORAL | Status: DC
  Administered 2021-03-24 – 2021-03-28 (×9): 250 mg via ORAL
  Filled 2021-03-24 (×9): qty 1

## 2021-03-24 NOTE — Progress Notes (Incomplete Revision)
Patient ID: Gregory [name], male   DOB: April 12, 1920, 88 y.o.   MRN: [id number]  SW received updates from attending pt d/c date changed to 2/15 due to medical concerns. SW updated [name]/[facility] and [name]/[facility] on changes.   *SW received updates from PA-Pam pt will need to remain on Nepro due to increase in potassium with Osmolite 1.5 (Nutren 1.5 equivalent). SW spoke with pt dtr [name] to discuss enteral feeds company- [facility]. SW to follow-up to discuss change in enteral feeds.  SW spoke with representative with [facility] ([phone]/f:[phone]) to discuss change in enteral feeds and cost. Reports only an order is needed to provide changes. She is unsure on how long it will take to process before item will ship. Pt will need enteral feeds for atleast 5-7 days to support gap until items are shipped.   SW faxed order and waiting on follow-up.   [name], MSW, [place] Office: [phone] Cell: [phone] Fax: [phone]

## 2021-03-24 NOTE — Progress Notes (Addendum)
Patient ID: Gregory [name], male   DOB: Dec 30, 1920, 88 y.o.   MRN: [id number]  SW received updates from attending pt d/c date changed to 2/15 due to medical concerns. SW updated [name][facility] and [name] on changes.   *SW received updates from PA-Pam pt will need to remain on Nepro due to increase in potassium with Osmolite 1.5 (Nutren 1.5 equivalent). SW spoke with pt dtr [name] to discuss enteral feeds company- [facility]. SW to follow-up to discuss change in enteral feeds.  SW spoke with representative with [facility] (p:[phone]/[phone]) to discuss change in enteral feeds and cost. Reports only an order is needed to provide changes. She is unsure on how long it will take to process before item will ship. Pt will need enteral feeds for atleast 5-7 days to support gap until items are shipped.   SW faxed order and waiting on follow-up.   [name], MSW, [place] Office: [phone] Cell: [phone] Fax: [phone]

## 2021-03-24 NOTE — Progress Notes (Signed)
Occupational Therapy Weekly Progress Note  Patient Details  Name: Gregory [name] MRN: [id number] Date of Birth: 1920/11/18  Beginning of progress report period: March 17, 2021 End of progress report period: March 24, 2021     Pt's d/c extended 2/2 drug reaction. Pt with myoclonus 2/2 antibiotic reaction. Pt progressing well until 2/9. Pt cga/supervision for sit<>stand, functional transfers, and BADLs. Standing balance with supervision. D/c extended until 2/15. Will reassess daily to monitor symptoms.  Patient continues to demonstrate the following deficits: muscle weakness and clonus, decreased cardiorespiratoy endurance, decreased safety awareness and decreased memory, and decreased sitting balance, decreased standing balance, decreased balance strategies, and generalized weakness  and therefore will continue to benefit from skilled OT intervention to enhance overall performance with BADL and Reduce care partner burden.  Patient progressing toward long term goals..  Continue plan of care.  OT Short Term Goals Week 2:  OT Short Term Goal 1 (Week 2): STG=LTG 2/2 ELOS (continued working to supervision goals) Week 3:  OT Short Term Goal 1 (Week 3): STG=LTG 2/2 ELOS (continued working to supervision goals)   Leroy Libman 03/24/2021, 2:54 PM

## 2021-03-24 NOTE — Progress Notes (Signed)
PROGRESS NOTE   Subjective/Complaints:  Couldn't sit on side of bed- due to myoclonus- it's actually a little worse this AM.   Called Neurology- suggested checking ammonia and starting Keppra 250 mg BID= will start.   ROS:   Pt denies SOB, abd pain, CP, N/V/C/D, and vision changes Spasms/clonus awful  Objective:   DG Abd 1 View  Result Date: 03/22/2021 CLINICAL DATA:  Constipation. EXAM: ABDOMEN - 1 VIEW COMPARISON:  None. FINDINGS: Gastrostomy tube is seen over the left upper quadrant in expected position of the stomach. No abnormal bowel dilatation is noted. Moderate amount of stool seen throughout the colon. IMPRESSION: Moderate stool burden.  No abnormal bowel dilatation. Electronically Signed   By: Marijo Conception M.D.   On: 03/22/2021 09:00   No results for input(s): WBC, HGB, HCT, PLT in the last 72 hours.  Recent Labs    03/24/21 0535  NA 136  K 3.9  CL 98  CO2 31  GLUCOSE 98  BUN 35*  CREATININE 1.33*  CALCIUM 9.1     Intake/Output Summary (Last 24 hours) at 03/24/2021 0809 Last data filed at 03/24/2021 0330 Gross per 24 hour  Intake 0 ml  Output 375 ml  Net -375 ml     Pressure Injury 03/03/21 Heel Left Deep Tissue Pressure Injury - Purple or maroon localized area of discolored intact skin or blood-filled blister due to damage of underlying soft tissue from pressure and/or shear. (Active)  03/03/21 1114  Location: Heel  Location Orientation: Left  Staging: Deep Tissue Pressure Injury - Purple or maroon localized area of discolored intact skin or blood-filled blister due to damage of underlying soft tissue from pressure and/or shear.  Wound Description (Comments):   Present on Admission: Yes     Pressure Injury 03/10/21 Toe (Comment  which one) Anterior;Left;Distal Unstageable - Full thickness tissue loss in which the base of the injury is covered by slough (yellow, tan, gray, green or brown) and/or  eschar (tan, brown or black) in the wound bed. (Active)  03/10/21 1730  Location: Toe (Comment  which one) (Left great toe at the tip.)  Location Orientation: Anterior;Left;Distal  Staging: Unstageable - Full thickness tissue loss in which the base of the injury is covered by slough (yellow, tan, gray, green or brown) and/or eschar (tan, brown or black) in the wound bed.  Wound Description (Comments): Black area at the tip of the left great toe.  Present on Admission: Yes     Pressure Injury 03/10/21 Toe (Comment  which one) Anterior;Right;Distal Unstageable - Full thickness tissue loss in which the base of the injury is covered by slough (yellow, tan, gray, green or brown) and/or eschar (tan, brown or black) in the wound bed (Active)  03/10/21 1730  Location: Toe (Comment  which one) (Right great toe at the tip of the toe.)  Location Orientation: Anterior;Right;Distal  Staging: Unstageable - Full thickness tissue loss in which the base of the injury is covered by slough (yellow, tan, gray, green or brown) and/or eschar (tan, brown or black) in the wound bed.  Wound Description (Comments): Right great toe at the tip of the toe.  Present on Admission:  Yes    Physical Exam: Vital Signs Blood pressure (!) 126/52, pulse 70, temperature (!) 97.5 F (36.4 C), temperature source Oral, resp. rate 18, height 5\' 8"  (1.727 m), weight 104 kg, SpO2 94 %.        General: awake, alert, appropriate, laying in bed; when sits up with assist, a lot of jerking NAD HENT: conjugate gaze; oropharynx moist CV: regular rate; no JVD Pulmonary: CTA B/L; no W/R/R- good air movement GI: soft, NT, ND, (+)BS Psychiatric: appropriate Neurological: alert- appropriate, making jokes- HOH; hearing aids not in; severe myoclonus noted- all over Ext: no clubbing, cyanosis, or edema Skin: foot/heel wounds- DTI-s covered with dressings-foam- C/D/I Neuro:  [facility] but able comprehend when he's able to hear. No focal language  issues. Mild dysarthria. Strength grossly 4/5 bilateral UE/LE's. No major change noted today  No focal sensory abnl. No abnl tone.  Musculoskeletal: Full ROM, No pain with AROM or PROM in the neck, trunk, or extremities. Posture appropriate     Assessment/Plan: 1. Functional deficits which require 3+ hours per day of interdisciplinary therapy in a comprehensive inpatient rehab setting. Physiatrist is providing close team supervision and 24 hour management of active medical problems listed below. Physiatrist and rehab team continue to assess barriers to discharge/monitor patient progress toward functional and medical goals  Care Tool:  Bathing    Body parts bathed by patient: Right arm, Left arm, Chest, Buttocks, Front perineal area, Abdomen, Right upper leg, Left upper leg, Face   Body parts bathed by helper: Buttocks     Bathing assist Assist Level: Minimal Assistance - Patient > 75%     Upper Body Dressing/Undressing Upper body dressing   What is the patient wearing?: Pull over shirt    Upper body assist Assist Level: Minimal Assistance - Patient > 75%    Lower Body Dressing/Undressing Lower body dressing      What is the patient wearing?: Incontinence brief, Pants     Lower body assist Assist for lower body dressing: Moderate Assistance - Patient 50 - 74%     Toileting Toileting    Toileting assist Assist for toileting: Minimal Assistance - Patient > 75%     Transfers Chair/bed transfer  Transfers assist     Chair/bed transfer assist level: Contact Guard/Touching assist     Locomotion Ambulation   Ambulation assist   Ambulation activity did not occur: Safety/medical concerns  Assist level: Contact Guard/Touching assist Assistive device: Walker-rolling Max distance: 150'   Walk 10 feet activity   Assist  Walk 10 feet activity did not occur: Safety/medical concerns  Assist level: Contact Guard/Touching assist Assistive device: Walker-rolling    Walk 50 feet activity   Assist Walk 50 feet with 2 turns activity did not occur: Safety/medical concerns  Assist level: Contact Guard/Touching assist Assistive device: Walker-rolling    Walk 150 feet activity   Assist Walk 150 feet activity did not occur: Safety/medical concerns  Assist level: Contact Guard/Touching assist Assistive device: Walker-rolling    Walk 10 feet on uneven surface  activity   Assist Walk 10 feet on uneven surfaces activity did not occur: Safety/medical concerns   Assist level: Minimal Assistance - Patient > 75% Assistive device: Walker-rolling   Wheelchair     Assist Is the patient using a wheelchair?: Yes Type of Wheelchair: Manual    Wheelchair assist level: Dependent - Patient 0%      Wheelchair 50 feet with 2 turns activity    Assist  Assist Level: Dependent - Patient 0%   Wheelchair 150 feet activity     Assist      Assist Level: Dependent - Patient 0%   Blood pressure (!) 126/52, pulse 70, temperature (!) 97.5 F (36.4 C), temperature source Oral, resp. rate 18, height 5\' 8"  (1.727 m), weight 104 kg, SpO2 94 %.  Medical Problem List and Plan: 1. Functional deficits secondary to debility             -patient may shower but PEG must be covered.              -ELOS/Goals: 2-3 weeks S-MinA             -d/c date 03/27/21 Doing ice chips with SLP- changed d/c to 2/13 due to IV ABX and progress to increase level of function 2/10- Continue CIR- PT, OT and SLP Cannot participate in therapy today due to myoclonus- and will extend d/c date to 2/15 2.  Antithrombotics: -DVT/anticoagulation:  Mechanical: Sequential compression devices, below knee Bilateral lower extremities 1/31- although Hb is stable, pt still having black tarry stools- will consult Cards about Eliquis- I think it's concerning to restart right now, but will let Cards decide.  - Cards and GI agree NO ELIQUIS or Blood thinners.               -antiplatelet therapy: N/A 3. Pain Management: Tylenol 4. Mood:  LCSW to follow              -antipsychotic agents: N/A 5. Neuropsych: This patient is capable of making decisions on his own behalf. 6. Skin/Wound Care: pressure relief, protection to feet/heels 7. Fluids/Electrolytes/Nutrition: continue TF  -hyperkalemia: lokelma 5mg  daily x 2 doses, recheck labs Monday  2/1- see below under hyperkalemia  2/6- K+ 3.8- will monitor 8. A flutter s/p TAVR 2020: plan is to hold eliquis for now and resume in the next few weeks potentially --HR controlled 1/29 1/30- hold eliquis- likely still bleeding- will call Cards in AM to see their plan.  9. Severe dysphagia w/aspiration: NPO with tube feeds for nutritonal support.  10. GIB: Monitor for signs of bleeding. Continue Protonix and Carafate. Holding Eliquis.  1/30- Hb 8.9- was 10.2- but previous was 8.9 so likely not bleeding severely, but will recheck labs in AM 1/31- Hb stable at 8.9- will monitor 2/1- GI maximized PPI to BID and maintained carafate- will also add FeSol solution BID for low iron levels 11. CKD 3a: BUN/SCr 124/2.07             --improved to 35/1.4 on 1/28  1/30- Cr 1.42 and BUN 34- overall stable- con't to monitor  -monitor output, labs Monday   1/31- Cr down slightly to 1.33 and BUN 32- con't to monitor  2/1- wil recheck in AM  2/2- Cr 1.26 and BUN down to 30- from 36- doing better- con't to monitor  2/3- Cr 1.34 and BUN 31- is chronic/baseline- will reduce dose of Keflex as a result.   2/6- BUN better at 29 and Cr stable at 1.34- con't to monitor 12. Constipation: Continue Senna daily. 2/2- LBM yesterday- is brown 2/6- LBM Friday- will give sorbitol today  2/7- LBM yesterday but Sorbitol hasn't worked yet- might need more- since BM yesterday was small-medium- will reorder Sorbitol again at 3pm 2/9- LBM yesterday- KUB shows moderate stool burden, but had BM after that.  13. HLD: continue atorvastatin 40mg  daily.  14.  Hypothyroidism: continue Synthroid.  15. Hyperkalemia- likely due  to TfF'- has PEG.   1/31- given multiple doses of Lokelma- down to 4.8- but will recheck again Thursday. 2/2- K+ 4.2- so doing much better on Nepro TF's  16. NPO-due to esophageal stricture/severe dysphagia on TF's  1/31- 1 reason came into hospital with failure to thrive due to not eating/drinking- per chart review- SLP involved, but not sure if will be able to not use PEG.   2/2- failed MBS - cannot have anything by mouth  2/4: continue feeding via PEG  2/6- no issues with PEG- using abd binder  2/8- doesn't need abd binder when In bed- only when up 17. ABLA  2/1- will add Feosol solution BID to help with blood loss. 24: hgb 8/6 on 2/3, monitor weekly  18. Leukocytosis- UTI  2/2- Mild- WBC 11.1- will check CXR since is NPO and U/A and Cx to try and find source and recheck in AM  2/3- WBC up to 12.8- afebrile- however urine "so turbid couldn't run U/A"- has many bacteria- will recheck again in 3-4 days per d/w pharmacy- wil start Keflex 500 mg q12 hours- x 7 days- might need longer treatment, but will determine next week. Urine cloudy., odorous as well per nursing.    2/4: UC with >100,000 colonies citrobacter, f/u sensititivities  2/6- Change to Cefepime per d/w pharmacy- willl need 7 days- might need to extend him? Will check if can do anything PO?  2/7- pharm suggests Septra, but agrees that 88 yr old, not great choice- really needs IV ABX- will keep until 2/13  2/8- con't IV ABX until Sunday- recheck labs monday 19. Constipation  2/8- will check KUB and treat as required based on results. 2/9- as above.   20. Myoclonus due to Cefepime-  2/10 added to allergy list- called Neuro- will check ammonia and also start Keppra 250 mg BID to help- per Neuro- Sx's should improve in a few days off Cefepime.    I spent a total of  51  minutes on total care today- >50% coordination of care- due to calling Neurology- and called  daughter to inform her of plan- she voiced understanding at end of call.         LOS: 14 days A FACE TO FACE EVALUATION WAS PERFORMED  Kallen Mccrystal 03/24/2021, 8:09 AM

## 2021-03-24 NOTE — Progress Notes (Signed)
Occupational Therapy Session Note  Patient Details  Name: Gregory [name] MRN: [id number] Date of Birth: August 03, 1920  Today's Date: 03/24/2021 OT Individual Time: 0700-0730 OT Individual Time Calculation (min): 30 min  and Today's Date: 03/24/2021 OT Missed Time: 70 Minutes Missed Time Reason: Other (comment) (unable to participate 2/2 reaction to antibotics)   Short Term Goals: Week 2:  OT Short Term Goal 1 (Week 2): STG=LTG 2/2 ELOS (continued working to supervision goals)  Skilled Therapeutic Interventions/Progress Updates:    Pt sleeping in bed upon arrival but easily aroused. Pt agreeable to sitting EOB. Min A for supine>sit. Once seated pt again experiencing myoclonus and required max A to remain seated and returned to supine. MD entered room and requested she witness symptoms. Min A for supine>sit EOB and symptomatic. Pt returned to supine. MD reported antibiotics changed afternoon of 2/9 but typically takes a couple of days. Discharge changed to 2/15. Pt remained in bed. All needs within reach. Pt missed 45 mins skilled OT services. Will check back later in day.  Therapy Documentation Precautions:  Precautions Precautions: Fall Precaution Comments: NPO with PEG tube, posterior Gregory Restrictions Weight Bearing Restrictions: No General: General OT Amount of Missed Time: 45 Minutes   Pain:  Pt denies pain this morning   Therapy/Group: Individual Therapy  Leroy Libman 03/24/2021, 7:42 AM

## 2021-03-24 NOTE — Progress Notes (Signed)
Nutrition Follow-up  DOCUMENTATION CODES:   Severe malnutrition in context of chronic illness  INTERVENTION:  Provide bolus feeds via PEG using Nepro formula at goal volume of 315 ml (10.5 ounces) given TID. (Total of 4 cartons/day)   Free water flushes of 250 ml every 6 hours per tube.    Tube feeding regimen to provide 1701 kcal, 77 grams of protein, and 1690 ml free water.   Daughter educated on tube feeding for discharge home.   NUTRITION DIAGNOSIS:   Severe Malnutrition related to chronic illness (dysphagia, afib) as evidenced by severe fat depletion, severe muscle depletion; ongoing  GOAL:   Patient will meet greater than or equal to 90% of their needs; met with TF  MONITOR:   TF tolerance, Skin, I & O's, Labs, Weight trends  REASON FOR ASSESSMENT:   New TF    ASSESSMENT:   88 year old male with history of A Flutter s/p ablation w/PPM, HTN, neuropathy, lung nodule, TVAR, HOH, dysphagia s/p G tube (placed 02/10/21) who was admitted on 03/02/21 with hypotension, dizziness, near syncope and hgb 5.9. FBOT positive with reports of dark stools as well as reports of Hgb of 7.0 and trauma to GI tract due to NGT per family. CIR recommended due to functional decline.  Pt has been tolerating his tube feeds well. Plans for pt to maintain and continue on current Nepro formula. PA with concern previous formula of Osmolite 1.5 cal resulted/aided in hyperkalemia. Plans to continue on current Nepro formula in prevention of future increase in potassium. Pt to follow up as outpatient upon discharge to continue to assess adequacy of tube feeding formula and regimen. Discussed tube feeding regimen with Daughter.  Discussed pt will need delivery/supply of Nepro formula (or novasource renal formula equivalent) upon discharge home. Social worker, Firefighter, notified.  Recommended feeding plan upon discharge home: (handout given to daughter) 9:00 am: Free water flush of 250 ml  10:00 am: Feeding  of 315 ml (10.5 oz) Nepro formula  12:00 am: Free water flush of 250 ml  2:00 pm: Feeding of 315 ml (10.5 oz) Nepro formula  4:00pm: Free water flush of 250 ml  7:00 pm: Feeding of 315 ml (10.5 oz) Nepro formula 9:00 pm: Free water flush of 250 ml  Flush tube with 30-60 ml (1-2 oz) water before and after bolus formula feedings.  Labs and medications reviewed.   Diet Order:   Diet Order             Diet NPO time specified Except for: Ice Chips  Diet effective now                   EDUCATION NEEDS:   Not appropriate for education at this time  Skin:  Skin Assessment: Reviewed RN Assessment Skin Integrity Issues:: DTI, Other (Comment) DTI: L heel Other: PI sacrum  Last BM:  2/6  Height:   Ht Readings from Last 1 Encounters:  03/10/21 '5\' 8"'  (1.727 m)    Weight:   Wt Readings from Last 1 Encounters:  03/24/21 104 kg   BMI:  Body mass index is 34.86 kg/m.  Estimated Nutritional Needs:   Kcal:  1650-1850  Protein:  75-90 grams  Fluid:  >/= 1.65 L/day  Corrin Parker, MS, RD, LDN RD pager number/after hours weekend pager number on Amion.

## 2021-03-24 NOTE — Progress Notes (Signed)
Physical Therapy Session Note  Patient Details  Name: Gregory [name] MRN: [id number] Date of Birth: [birth year]  Today's Date: 03/24/2021 PT Individual Time: 1305-1355 PT Individual Time Calculation (min): 50 min   Short Term Goals: Week 2:  PT Short Term Goal 1 (Week 2): =LTG due to ELOS  Skilled Therapeutic Interventions/Progress Updates: Tx1: Pt presented in bed sleeping but easily aroused. Pt continues to have weakness, and increased tremors. Per MD may be due to antibiotics and d/c has been moved to 2/15. Pt missed 60 min skilled PT due to fatigue/weakness. Adv will check in at 1pm when family ed scheduled to see if pt more appropriate for participation in therapy.   Tx2: Pt presented in bed with dgt present. Pt continues to feel weak/fatigued. Pt agreeable to attempt to sit EOB. Up initiation of movement pt with full body myoclonic activity. Deferred to bed level activity. Pt participated in UB therex for general conditioning as follows:  All 2 x 10 Bicep curls 2lb dowel Chest press 2lb dowel Shoulder horiz abd/add 2 lb dowel Circles with 2lb dowel Triceps extension with red theraband Shoulder ER with red theraband Shoulder shrugs/elevation AROM Scapular pinches x 10  PTA then attempted LE activities initially performing PROM then attempting AAROM with decreased R>L myoclonic activity x 10 each  Heel slides Manually resisted leg press PROM hip er/ir SAQ  Discussed with pt's dgt current functional status explaining that pt demonstrates functional strength but difficulty due to myoclonic activity. [name] verbalized understanding. Pt left in bed at end of session with call bell within reach and needs met.        Therapy Documentation Precautions:  Precautions Precautions: Fall Precaution Comments: NPO with PEG tube, posterior lean Restrictions Weight Bearing Restrictions: No General: PT Amount of Missed Time (min): 70 Minutes PT Missed Treatment Reason: Patient  fatigue Vital Signs: Therapy Vitals Temp: 97.7 F (36.5 C) Temp Source: Oral Pulse Rate: 71 Resp: 16 BP: (!) 113/50 Patient Position (if appropriate): Lying Oxygen Therapy SpO2: 95 % O2 Device: Room Air Pain:   Mobility:   Locomotion :    Trunk/Postural Assessment :    Balance:   Exercises:   Other Treatments:      Therapy/Group: Individual Therapy  Shadae Reino 03/24/2021, 3:06 PM

## 2021-03-25 LAB — GLUCOSE, CAPILLARY
Glucose-Capillary: 131 mg/dL — ABNORMAL HIGH (ref 70–99)
Glucose-Capillary: 81 mg/dL (ref 70–99)
Glucose-Capillary: 97 mg/dL (ref 70–99)
Glucose-Capillary: 99 mg/dL (ref 70–99)

## 2021-03-25 NOTE — Progress Notes (Signed)
PROGRESS NOTE   Subjective/Complaints:  Has tremors- since was in 20s/30- but myoclonus was bad again yesterday when tried to get OOB- hasn't tried yet this AM.   Also cold when people leave his door open.   ROS:   Pt denies SOB, abd pain, CP, N/V/C/D, and vision changes   Objective:   No results found. No results for input(s): WBC, HGB, HCT, PLT in the last 72 hours.  Recent Labs    03/24/21 0535  NA 136  K 3.9  CL 98  CO2 31  GLUCOSE 98  BUN 35*  CREATININE 1.33*  CALCIUM 9.1     Intake/Output Summary (Last 24 hours) at 03/25/2021 1227 Last data filed at 03/25/2021 0532 Gross per 24 hour  Intake 0 ml  Output 1600 ml  Net -1600 ml     Pressure Injury 03/03/21 Heel Left Deep Tissue Pressure Injury - Purple or maroon localized area of discolored intact skin or blood-filled blister due to damage of underlying soft tissue from pressure and/or shear. (Active)  03/03/21 1114  Location: Heel  Location Orientation: Left  Staging: Deep Tissue Pressure Injury - Purple or maroon localized area of discolored intact skin or blood-filled blister due to damage of underlying soft tissue from pressure and/or shear.  Wound Description (Comments):   Present on Admission: Yes     Pressure Injury 03/10/21 Toe (Comment  which one) Anterior;Left;Distal Unstageable - Full thickness tissue loss in which the base of the injury is covered by slough (yellow, tan, gray, green or brown) and/or eschar (tan, brown or black) in the wound bed. (Active)  03/10/21 1730  Location: Toe (Comment  which one) (Left great toe at the tip.)  Location Orientation: Anterior;Left;Distal  Staging: Unstageable - Full thickness tissue loss in which the base of the injury is covered by slough (yellow, tan, gray, green or brown) and/or eschar (tan, brown or black) in the wound bed.  Wound Description (Comments): Black area at the tip of the left great toe.   Present on Admission: Yes     Pressure Injury 03/10/21 Toe (Comment  which one) Anterior;Right;Distal Unstageable - Full thickness tissue loss in which the base of the injury is covered by slough (yellow, tan, gray, green or brown) and/or eschar (tan, brown or black) in the wound bed (Active)  03/10/21 1730  Location: Toe (Comment  which one) (Right great toe at the tip of the toe.)  Location Orientation: Anterior;Right;Distal  Staging: Unstageable - Full thickness tissue loss in which the base of the injury is covered by slough (yellow, tan, gray, green or brown) and/or eschar (tan, brown or black) in the wound bed.  Wound Description (Comments): Right great toe at the tip of the toe.  Present on Admission: Yes    Physical Exam: Vital Signs Blood pressure (!) 113/57, pulse 70, temperature 97.8 F (36.6 C), resp. rate 16, height 5\' 8"  (1.727 m), weight 104.1 kg, SpO2 95 %.         General: awake, alert, appropriate, HOH- son at bedside; NAD HENT: conjugate gaze; oropharynx moist CV: regular rate; no JVD Pulmonary: CTA B/L; no W/R/R- good air movement GI: soft, NT, ND, (+)BS  Psychiatric: appropriate Neurological: HOH; Ox3- tremors of Ue's when tested, but cannot elicit myoclonus since not sitting up this AM Ext: no clubbing, cyanosis, or edema Skin: foot/heel wounds- DTI-s covered with dressings-foam- C/D/I Neuro:  [facility] but able comprehend when he's able to hear. No focal language issues. Mild dysarthria. Strength grossly 4/5 bilateral UE/LE's. No major change noted today  No focal sensory abnl. No abnl tone.  Musculoskeletal: Full ROM, No pain with AROM or PROM in the neck, trunk, or extremities. Posture appropriate     Assessment/Plan: 1. Functional deficits which require 3+ hours per day of interdisciplinary therapy in a comprehensive inpatient rehab setting. Physiatrist is providing close team supervision and 24 hour management of active medical problems listed  below. Physiatrist and rehab team continue to assess barriers to discharge/monitor patient progress toward functional and medical goals  Care Tool:  Bathing    Body parts bathed by patient: Right arm, Left arm, Chest, Buttocks, Front perineal area, Abdomen, Right upper leg, Left upper leg, Face   Body parts bathed by helper: Buttocks     Bathing assist Assist Level: Minimal Assistance - Patient > 75%     Upper Body Dressing/Undressing Upper body dressing   What is the patient wearing?: Pull over shirt    Upper body assist Assist Level: Minimal Assistance - Patient > 75%    Lower Body Dressing/Undressing Lower body dressing      What is the patient wearing?: Incontinence brief, Pants     Lower body assist Assist for lower body dressing: Moderate Assistance - Patient 50 - 74%     Toileting Toileting    Toileting assist Assist for toileting: Minimal Assistance - Patient > 75%     Transfers Chair/bed transfer  Transfers assist     Chair/bed transfer assist level: Contact Guard/Touching assist     Locomotion Ambulation   Ambulation assist   Ambulation activity did not occur: Safety/medical concerns  Assist level: Contact Guard/Touching assist Assistive device: Walker-rolling Max distance: 150'   Walk 10 feet activity   Assist  Walk 10 feet activity did not occur: Safety/medical concerns  Assist level: Contact Guard/Touching assist Assistive device: Walker-rolling   Walk 50 feet activity   Assist Walk 50 feet with 2 turns activity did not occur: Safety/medical concerns  Assist level: Contact Guard/Touching assist Assistive device: Walker-rolling    Walk 150 feet activity   Assist Walk 150 feet activity did not occur: Safety/medical concerns  Assist level: Contact Guard/Touching assist Assistive device: Walker-rolling    Walk 10 feet on uneven surface  activity   Assist Walk 10 feet on uneven surfaces activity did not occur:  Safety/medical concerns   Assist level: Minimal Assistance - Patient > 75% Assistive device: Walker-rolling   Wheelchair     Assist Is the patient using a wheelchair?: Yes Type of Wheelchair: Manual    Wheelchair assist level: Dependent - Patient 0%      Wheelchair 50 feet with 2 turns activity    Assist        Assist Level: Dependent - Patient 0%   Wheelchair 150 feet activity     Assist      Assist Level: Dependent - Patient 0%   Blood pressure (!) 113/57, pulse 70, temperature 97.8 F (36.6 C), resp. rate 16, height 5\' 8"  (1.727 m), weight 104.1 kg, SpO2 95 %.  Medical Problem List and Plan: 1. Functional deficits secondary to debility             -  patient may shower but PEG must be covered.              -ELOS/Goals: 2-3 weeks S-MinA             -d/c date 03/27/21 Doing ice chips with SLP- changed d/c to 2/13 due to IV ABX and progress to increase level of function Continue CIR- PT, OT and SLP Asked for PT to see tomorrow- is scheduled- to see how myoclonus doing.  2.  Antithrombotics: -DVT/anticoagulation:  Mechanical: Sequential compression devices, below knee Bilateral lower extremities 1/31- although Hb is stable, pt still having black tarry stools- will consult Cards about Eliquis- I think it's concerning to restart right now, but will let Cards decide.  - Cards and GI agree NO ELIQUIS or Blood thinners.              -antiplatelet therapy: N/A 3. Pain Management: Tylenol 4. Mood:  LCSW to follow              -antipsychotic agents: N/A 5. Neuropsych: This patient is capable of making decisions on his own behalf. 6. Skin/Wound Care: pressure relief, protection to feet/heels 7. Fluids/Electrolytes/Nutrition: continue TF  -hyperkalemia: lokelma 5mg  daily x 2 doses, recheck labs Monday  2/1- see below under hyperkalemia  2/6- K+ 3.8- will monitor  2/11- K+ 3.9- doing well 8. A flutter s/p TAVR 2020: plan is to hold eliquis for now and resume in the  next few weeks potentially --HR controlled 1/29 1/30- hold eliquis- likely still bleeding- will call Cards in AM to see their plan.  9. Severe dysphagia w/aspiration: NPO with tube feeds for nutritonal support.  10. GIB: Monitor for signs of bleeding. Continue Protonix and Carafate. Holding Eliquis.  1/30- Hb 8.9- was 10.2- but previous was 8.9 so likely not bleeding severely, but will recheck labs in AM 1/31- Hb stable at 8.9- will monitor 2/1- GI maximized PPI to BID and maintained carafate- will also add FeSol solution BID for low iron levels 11. CKD 3a: BUN/SCr 124/2.07             --improved to 35/1.4 on 1/28  1/30- Cr 1.42 and BUN 34- overall stable- con't to monitor  -monitor output, labs Monday   1/31- Cr down slightly to 1.33 and BUN 32- con't to monitor  2/1- wil recheck in AM  2/2- Cr 1.26 and BUN down to 30- from 36- doing better- con't to monitor  2/3- Cr 1.34 and BUN 31- is chronic/baseline- will reduce dose of Keflex as a result.   2/6- BUN better at 29 and Cr stable at 1.34- con't to monitor 12. Constipation: Continue Senna daily. 2/2- LBM yesterday- is brown 2/6- LBM Friday- will give sorbitol today  2/7- LBM yesterday but Sorbitol hasn't worked yet- might need more- since BM yesterday was small-medium- will reorder Sorbitol again at 3pm 2/9- LBM yesterday- KUB shows moderate stool burden, but had BM after that.  13. HLD: continue atorvastatin 40mg  daily.  14. Hypothyroidism: continue Synthroid.  15. Hyperkalemia- likely due to TfF'- has PEG.   1/31- given multiple doses of Lokelma- down to 4.8- but will recheck again Thursday. 2/2- K+ 4.2- so doing much better on Nepro TF's  16. NPO-due to esophageal stricture/severe dysphagia on TF's  1/31- 1 reason came into hospital with failure to thrive due to not eating/drinking- per chart review- SLP involved, but not sure if will be able to not use PEG.   2/2- failed MBS - cannot have anything by  mouth  2/4: continue feeding  via PEG  2/6- no issues with PEG- using abd binder  2/8- doesn't need abd binder when In bed- only when up 17. ABLA  2/1- will add Feosol solution BID to help with blood loss. 24: hgb 8/6 on 2/3, monitor weekly  18. Leukocytosis- UTI  2/2- Mild- WBC 11.1- will check CXR since is NPO and U/A and Cx to try and find source and recheck in AM  2/3- WBC up to 12.8- afebrile- however urine "so turbid couldn't run U/A"- has many bacteria- will recheck again in 3-4 days per d/w pharmacy- wil start Keflex 500 mg q12 hours- x 7 days- might need longer treatment, but will determine next week. Urine cloudy., odorous as well per nursing.    2/4: UC with >100,000 colonies citrobacter, f/u sensititivities  2/6- Change to Cefepime per d/w pharmacy- willl need 7 days- might need to extend him? Will check if can do anything PO?  2/7- pharm suggests Septra, but agrees that 88 yr old, not great choice- really needs IV ABX- will keep until 2/13  2/8- con't IV ABX until Sunday- recheck labs monday 19. Constipation  2/8- will check KUB and treat as required based on results. 2/9- as above.   20. Myoclonus due to Cefepime-  2/10 added to allergy list- called Neuro- will check ammonia and also start Keppra 250 mg BID to help- per Neuro- Sx's should improve in a few days off Cefepime.   2/11- Neurology said could take 3-4 days to get better- hard to test since occurs when gets up- asked nursing to get to chair today.      LOS: 15 days A FACE TO FACE EVALUATION WAS PERFORMED  Kailiana Granquist 03/25/2021, 12:27 PM

## 2021-03-25 NOTE — Progress Notes (Signed)
Speech Language Pathology Daily Session Note  Patient Details  Name: Morrill Bomkamp MRN: [id number] Date of Birth: 1920/11/26  Today's Date: 03/25/2021 SLP Individual Time: 1330-1400 SLP Individual Time Calculation (min): 30 min  Short Term Goals: Week 2: SLP Short Term Goal 1 (Week 2): STG=LTG due to ELOS SLP Short Term Goal 1 - Progress (Week 2): Progressing toward goal  Skilled Therapeutic Interventions:   Patient seen for skilled ST session with daughter present in room, with focus of session on dysphagia goals. Patient required some cues to initiate and when asked if he would like to try some ice chips he said, "whatever". He did not demonstrate awareness of the EMST device even when SLP showed them to him. He was agreeable to completing his own oral care which SLP setup. After oral care he started "hocking" up some secretions and was able to expectorate and/or use oral suction to remove. SLP then observed patient consuming small ice chips (one at a time, total of 8) which he was able to masticate and swallow without overt s/s aspiration or penetration. He did continue to have "hocking" of secretions and overall cough seemed dry. SLP educated daughter on patient's swallow goals with plan for patient's primary SLP to determine if patient would be appropriate to discharge on PRN ice chips after oral care when discharged home with family. Patient left in bed with all needs within reach. He continues to benefit from skilled SLP intervention to maximize swallow function prior to discharge.  Pain Pain Assessment Pain Scale: 0-10 Pain Score: 0-No pain  Therapy/Group: Individual Therapy  Sonia Baller, MA, CCC-SLP Speech Therapy

## 2021-03-26 LAB — BASIC METABOLIC PANEL
Anion gap: 11 (ref 5–15)
BUN: 35 mg/dL — ABNORMAL HIGH (ref 8–23)
CO2: 26 mmol/L (ref 22–32)
Calcium: 9.3 mg/dL (ref 8.9–10.3)
Chloride: 102 mmol/L (ref 98–111)
Creatinine, Ser: 1.4 mg/dL — ABNORMAL HIGH (ref 0.61–1.24)
GFR, Estimated: 45 mL/min — ABNORMAL LOW (ref >60)
Glucose, Bld: 88 mg/dL (ref 70–99)
Potassium: 4.2 mmol/L (ref 3.5–5.1)
Sodium: 139 mmol/L (ref 135–145)

## 2021-03-26 LAB — GLUCOSE, CAPILLARY
Glucose-Capillary: 113 mg/dL — ABNORMAL HIGH (ref 70–99)
Glucose-Capillary: 87 mg/dL (ref 70–99)
Glucose-Capillary: 88 mg/dL (ref 70–99)
Glucose-Capillary: 99 mg/dL (ref 70–99)

## 2021-03-26 NOTE — Progress Notes (Signed)
PROGRESS NOTE   Subjective/Complaints:  Pt reports was able to stand today, but not ab le to walk like he had- thinks spasms better- Spoke with PT_ spasms almost gone, but weaker due to bedrest for last few days- hasn't walked since Thursday.   No trouble sleeping- wants to go back to sleep.    ROS:   Pt denies SOB, abd pain, CP, N/V/C/D, and vision changes  Objective:   No results found. No results for input(s): WBC, HGB, HCT, PLT in the last 72 hours.  Recent Labs    03/24/21 0535 03/26/21 0604  NA 136 139  K 3.9 4.2  CL 98 102  CO2 31 26  GLUCOSE 98 88  BUN 35* 35*  CREATININE 1.33* 1.40*  CALCIUM 9.1 9.3     Intake/Output Summary (Last 24 hours) at 03/26/2021 1517 Last data filed at 03/26/2021 1300 Gross per 24 hour  Intake --  Output 2102 ml  Net -2102 ml     Pressure Injury 03/03/21 Heel Left Deep Tissue Pressure Injury - Purple or maroon localized area of discolored intact skin or blood-filled blister due to damage of underlying soft tissue from pressure and/or shear. (Active)  03/03/21 1114  Location: Heel  Location Orientation: Left  Staging: Deep Tissue Pressure Injury - Purple or maroon localized area of discolored intact skin or blood-filled blister due to damage of underlying soft tissue from pressure and/or shear.  Wound Description (Comments):   Present on Admission: Yes     Pressure Injury 03/10/21 Toe (Comment  which one) Anterior;Left;Distal Unstageable - Full thickness tissue loss in which the base of the injury is covered by slough (yellow, tan, gray, green or brown) and/or eschar (tan, brown or black) in the wound bed. (Active)  03/10/21 1730  Location: Toe (Comment  which one) (Left great toe at the tip.)  Location Orientation: Anterior;Left;Distal  Staging: Unstageable - Full thickness tissue loss in which the base of the injury is covered by slough (yellow, tan, gray, green or brown)  and/or eschar (tan, brown or black) in the wound bed.  Wound Description (Comments): Black area at the tip of the left great toe.  Present on Admission: Yes     Pressure Injury 03/10/21 Toe (Comment  which one) Anterior;Right;Distal Unstageable - Full thickness tissue loss in which the base of the injury is covered by slough (yellow, tan, gray, green or brown) and/or eschar (tan, brown or black) in the wound bed (Active)  03/10/21 1730  Location: Toe (Comment  which one) (Right great toe at the tip of the toe.)  Location Orientation: Anterior;Right;Distal  Staging: Unstageable - Full thickness tissue loss in which the base of the injury is covered by slough (yellow, tan, gray, green or brown) and/or eschar (tan, brown or black) in the wound bed.  Wound Description (Comments): Right great toe at the tip of the toe.  Present on Admission: Yes    Physical Exam: Vital Signs Blood pressure 123/66, pulse 84, temperature 97.8 F (36.6 C), temperature source Oral, resp. rate 16, height 5\' 8"  (1.727 m), weight 104 kg, SpO2 95 %.          General: awake, alert,  appropriate, supine- slepeing initially- son at bedside; NAD HENT: conjugate gaze; oropharynx moist CV: regular rate; no JVD Pulmonary: CTA B/L; no W/R/R- good air movement GI: soft, NT, ND, (+)BS Psychiatric: appropriate- slightly irritable, but joking by end Neurological: Ox3- HOH- making jokes Ext: no clubbing, cyanosis, or edema Skin: foot/heel wounds- DTI-s covered with dressings-foam- C/D/I Neuro:  HOH but able comprehend when he's able to hear. No focal language issues. Mild dysarthria. Strength grossly 4/5 bilateral UE/LE's. No major change noted today  No focal sensory abnl. No abnl tone.  Musculoskeletal: Full ROM, No pain with AROM or PROM in the neck, trunk, or extremities. Posture appropriate     Assessment/Plan: 1. Functional deficits which require 3+ hours per day of interdisciplinary therapy in a comprehensive  inpatient rehab setting. Physiatrist is providing close team supervision and 24 hour management of active medical problems listed below. Physiatrist and rehab team continue to assess barriers to discharge/monitor patient progress toward functional and medical goals  Care Tool:  Bathing    Body parts bathed by patient: Right arm, Left arm, Chest, Buttocks, Front perineal area, Abdomen, Right upper leg, Left upper leg, Face   Body parts bathed by helper: Buttocks     Bathing assist Assist Level: Minimal Assistance - Patient > 75%     Upper Body Dressing/Undressing Upper body dressing   What is the patient wearing?: Pull over shirt    Upper body assist Assist Level: Minimal Assistance - Patient > 75%    Lower Body Dressing/Undressing Lower body dressing      What is the patient wearing?: Incontinence brief, Pants     Lower body assist Assist for lower body dressing: Moderate Assistance - Patient 50 - 74%     Toileting Toileting    Toileting assist Assist for toileting: Minimal Assistance - Patient > 75%     Transfers Chair/bed transfer  Transfers assist     Chair/bed transfer assist level: Moderate Assistance - Patient 50 - 74%     Locomotion Ambulation   Ambulation assist   Ambulation activity did not occur: Safety/medical concerns  Assist level: Contact Guard/Touching assist Assistive device: Walker-rolling Max distance: 150'   Walk 10 feet activity   Assist  Walk 10 feet activity did not occur: Safety/medical concerns  Assist level: Contact Guard/Touching assist Assistive device: Walker-rolling   Walk 50 feet activity   Assist Walk 50 feet with 2 turns activity did not occur: Safety/medical concerns  Assist level: Contact Guard/Touching assist Assistive device: Walker-rolling    Walk 150 feet activity   Assist Walk 150 feet activity did not occur: Safety/medical concerns  Assist level: Contact Guard/Touching assist Assistive device:  Walker-rolling    Walk 10 feet on uneven surface  activity   Assist Walk 10 feet on uneven surfaces activity did not occur: Safety/medical concerns   Assist level: Minimal Assistance - Patient > 75% Assistive device: Walker-rolling   Wheelchair     Assist Is the patient using a wheelchair?: Yes Type of Wheelchair: Manual    Wheelchair assist level: Dependent - Patient 0%      Wheelchair 50 feet with 2 turns activity    Assist        Assist Level: Dependent - Patient 0%   Wheelchair 150 feet activity     Assist      Assist Level: Dependent - Patient 0%   Blood pressure 123/66, pulse 84, temperature 97.8 F (36.6 C), temperature source Oral, resp. rate 16, height 5\' 8"  (1.727 m),  weight 104 kg, SpO2 95 %.  Medical Problem List and Plan: 1. Functional deficits secondary to debility             -patient may shower but PEG must be covered.              -ELOS/Goals: 2-3 weeks S-MinA             -d/c date 03/27/21 Doing ice chips with SLP- changed d/c to 2/13 due to IV ABX and progress to increase level of function Pt standing better- myoclonus much better- con't CIR_ PT, OT and SLP- don't see so far will need to move d/c out more, but will see how pt does tomorrow with therapy.  2.  Antithrombotics: -DVT/anticoagulation:  Mechanical: Sequential compression devices, below knee Bilateral lower extremities 1/31- although Hb is stable, pt still having black tarry stools- will consult Cards about Eliquis- I think it's concerning to restart right now, but will let Cards decide.  - Cards and GI agree NO ELIQUIS or Blood thinners.              -antiplatelet therapy: N/A 3. Pain Management: Tylenol 4. Mood:  LCSW to follow              -antipsychotic agents: N/A 5. Neuropsych: This patient is capable of making decisions on his own behalf. 6. Skin/Wound Care: pressure relief, protection to feet/heels 7. Fluids/Electrolytes/Nutrition: continue TF  -hyperkalemia:  lokelma 5mg  daily x 2 doses, recheck labs Monday  2/1- see below under hyperkalemia  2/6- K+ 3.8- will monitor  2/11- K+ 3.9- doing well  2/12- K+ 4.2 8. A flutter s/p TAVR 2020: plan is to hold eliquis for now and resume in the next few weeks potentially --HR controlled 1/29 1/30- hold eliquis- likely still bleeding- will call Cards in AM to see their plan.  9. Severe dysphagia w/aspiration: NPO with tube feeds for nutritonal support.  10. GIB: Monitor for signs of bleeding. Continue Protonix and Carafate. Holding Eliquis.  1/30- Hb 8.9- was 10.2- but previous was 8.9 so likely not bleeding severely, but will recheck labs in AM 1/31- Hb stable at 8.9- will monitor 2/1- GI maximized PPI to BID and maintained carafate- will also add FeSol solution BID for low iron levels 11. CKD 3a: BUN/SCr 124/2.07             --improved to 35/1.4 on 1/28  1/30- Cr 1.42 and BUN 34- overall stable- con't to monitor  -monitor output, labs Monday   1/31- Cr down slightly to 1.33 and BUN 32- con't to monitor  2/1- wil recheck in AM  2/2- Cr 1.26 and BUN down to 30- from 36- doing better- con't to monitor  2/3- Cr 1.34 and BUN 31- is chronic/baseline- will reduce dose of Keflex as a result.   2/6- BUN better at 29 and Cr stable at 1.34- con't to monitor  2/12- BUN stable at 35 and Cr 1.40- likely due to Septra- will improve once off- today last dose of Septra- labs in AM 12. Constipation: Continue Senna daily. 2/2- LBM yesterday- is brown 2/6- LBM Friday- will give sorbitol today  2/7- LBM yesterday but Sorbitol hasn't worked yet- might need more- since BM yesterday was small-medium- will reorder Sorbitol again at 3pm 2/9- LBM yesterday- KUB shows moderate stool burden, but had BM after that.  13. HLD: continue atorvastatin 40mg  daily.  14. Hypothyroidism: continue Synthroid.  15. Hyperkalemia- likely due to TfF'- has PEG.   1/31- given  multiple doses of Lokelma- down to 4.8- but will recheck again  Thursday. 2/2- K+ 4.2- so doing much better on Nepro TF's  16. NPO-due to esophageal stricture/severe dysphagia on TF's  1/31- 1 reason came into hospital with failure to thrive due to not eating/drinking- per chart review- SLP involved, but not sure if will be able to not use PEG.   2/2- failed MBS - cannot have anything by mouth  2/4: continue feeding via PEG  2/6- no issues with PEG- using abd binder  2/8- doesn't need abd binder when In bed- only when up 17. ABLA  2/1- will add Feosol solution BID to help with blood loss. 24: hgb 8/6 on 2/3, monitor weekly  18. Leukocytosis- UTI  2/2- Mild- WBC 11.1- will check CXR since is NPO and U/A and Cx to try and find source and recheck in AM  2/3- WBC up to 12.8- afebrile- however urine "so turbid couldn't run U/A"- has many bacteria- will recheck again in 3-4 days per d/w pharmacy- wil start Keflex 500 mg q12 hours- x 7 days- might need longer treatment, but will determine next week. Urine cloudy., odorous as well per nursing.    2/4: UC with >100,000 colonies citrobacter, f/u sensititivities  2/6- Change to Cefepime per d/w pharmacy- willl need 7 days- might need to extend him? Will check if can do anything PO?  2/7- pharm suggests Septra, but agrees that 88 yr old, not great choice- really needs IV ABX- will keep until 2/13  2/8- con't IV ABX until Sunday- recheck labs Monday  2/12- on Septra- last meds/Septra today- labs in AM 19. Constipation  2/8- will check KUB and treat as required based on results. 2/9- as above.   20. Myoclonus due to Cefepime-  2/10 added to allergy list- called Neuro- will check ammonia and also start Keppra 250 mg BID to help- per Neuro- Sx's should improve in a few days off Cefepime.   2/11- Neurology said could take 3-4 days to get better- hard to test since occurs when gets up- asked nursing to get to chair today.   2/12- stood today- much improved- almost resolved- happy that it's getting better- will not send  home on Keppra, but continue until d/c.    I spent a total of  38  minutes on total care today- >50% coordination of care- due to  d/w nursing at length about pt's labs- d/c date and d/w son about care.    LOS: 16 days A FACE TO FACE EVALUATION WAS PERFORMED  Pam Vanalstine 03/26/2021, 3:17 PM

## 2021-03-26 NOTE — Progress Notes (Signed)
Physical Therapy Session Note  Patient Details  Name: Gregory [name] MRN: [id number] Date of Birth: Dec 28, 1920  Today's Date: 03/26/2021 PT Individual Time: 1000-1045 PT Individual Time Calculation (min): 45 min   Short Term Goals: Week 2:  PT Short Term Goal 1 (Week 2): =LTG due to ELOS  Skilled Therapeutic Interventions/Progress Updates:    Pt received supine in bed asleep, arousable and agreeable with encouragement to participate in therapy session. No complaints of pain, reports feeling very weak from myoclonus the past few days and not being able to get out of bed or stand. Supine to sit with mod A and increased time needed to complete transfer. Pt initially exhibits significant posterior lean in sitting requiring min to mod A to prevent posterior LOB in sitting. Pt agreeable to try standing this AM. Sit to stand with mod A to RW with cues for anterior weight shift. Pt reports urge to have a BM. Stand pivot transfer to [facility] with RW and mod A, increased time and cues needed for safety. Pt is dependent for pericare and clothing management. Able to continently void while seated on BSC. Nursing also in room to administer enema and pt able to further void while seated on BSC. Stand pivot transfer back to bed with RW and mod A. Pt declines any further participation this AM due to fatigue but encouraged by improved ability to stand this date as compared to previous date. Sit to supine mod A needed for BLE management. Pt does exhibit overall decline in physical function as compared to what this therapist observed last week with increased assist needed for functional mobility (mod A overall as compared to CGA). Pt left supine in bed with needs in reach, bed alarm in place. Pt missed 15 min of scheduled therapy session due to fatigue this AM.  Therapy Documentation Precautions:  Precautions Precautions: Fall Precaution Comments: NPO with PEG tube, posterior lean Restrictions Weight Bearing  Restrictions: No General: PT Amount of Missed Time (min): 15 Minutes PT Missed Treatment Reason: Patient fatigue      Therapy/Group: Individual Therapy   Excell Seltzer, PT, DPT, CSRS  03/26/2021, 10:46 AM

## 2021-03-27 DIAGNOSIS — R1319 Other dysphagia: Secondary | ICD-10-CM

## 2021-03-27 DIAGNOSIS — R7989 Other specified abnormal findings of blood chemistry: Secondary | ICD-10-CM

## 2021-03-27 DIAGNOSIS — G253 Myoclonus: Secondary | ICD-10-CM

## 2021-03-27 LAB — COMPREHENSIVE METABOLIC PANEL
ALT: 29 U/L (ref 0–44)
AST: 33 U/L (ref 15–41)
Albumin: 2.8 g/dL — ABNORMAL LOW (ref 3.5–5.0)
Alkaline Phosphatase: 63 U/L (ref 38–126)
Anion gap: 8 (ref 5–15)
BUN: 39 mg/dL — ABNORMAL HIGH (ref 8–23)
CO2: 31 mmol/L (ref 22–32)
Calcium: 9.3 mg/dL (ref 8.9–10.3)
Chloride: 97 mmol/L — ABNORMAL LOW (ref 98–111)
Creatinine, Ser: 1.52 mg/dL — ABNORMAL HIGH (ref 0.61–1.24)
GFR, Estimated: 41 mL/min — ABNORMAL LOW (ref >60)
Glucose, Bld: 98 mg/dL (ref 70–99)
Potassium: 4.3 mmol/L (ref 3.5–5.1)
Sodium: 136 mmol/L (ref 135–145)
Total Bilirubin: 0.4 mg/dL (ref 0.3–1.2)
Total Protein: 6 g/dL — ABNORMAL LOW (ref 6.5–8.1)

## 2021-03-27 LAB — GLUCOSE, CAPILLARY
Glucose-Capillary: 123 mg/dL — ABNORMAL HIGH (ref 70–99)
Glucose-Capillary: 85 mg/dL (ref 70–99)
Glucose-Capillary: 87 mg/dL (ref 70–99)

## 2021-03-27 LAB — CBC
HCT: 27.6 % — ABNORMAL LOW (ref 39.0–52.0)
Hemoglobin: 8.9 g/dL — ABNORMAL LOW (ref 13.0–17.0)
MCH: 31.3 pg (ref 26.0–34.0)
MCHC: 32.2 g/dL (ref 30.0–36.0)
MCV: 97.2 fL (ref 80.0–100.0)
Platelets: 198 10*3/uL (ref 150–400)
RBC: 2.84 MIL/uL — ABNORMAL LOW (ref 4.22–5.81)
RDW: 17.2 % — ABNORMAL HIGH (ref 11.5–15.5)
WBC: 8.7 10*3/uL (ref 4.0–10.5)
nRBC: 0 % (ref 0.0–0.2)

## 2021-03-27 MED ORDER — SODIUM CHLORIDE 0.45 % IV SOLN
INTRAVENOUS | Status: DC
  Administered 2021-03-29: 1000 mL via INTRAVENOUS

## 2021-03-27 NOTE — Progress Notes (Signed)
Speech Language Pathology Daily Session Note  Patient Details  Name: Gregory [name] MRN: [id number] Date of Birth: [birth year]  Today's Date: 03/27/2021 SLP Individual Time: 0830-0910 SLP Individual Time Calculation (min): 40 min  Short Term Goals: Week 3: SLP Short Term Goal 1 (Week 3): STGs=LTGs due to ELOS  Skilled Therapeutic Interventions:Skilled ST services focused on swallow skills. SLP completed oral care with suction toothbrush, per pt request, wanting hands to remain under the covers to stay warm. Pt consumed x11 ice chips while completing x10 CTAR exercises. Pt demonstrated no overt s/s aspiration, however x1 dry coughing up of thick mucus. Pt consumed x2 TSP of thin liquids without a chin tuck resulting in immediate cough and with a chin tuck demonstrated a delayed cough. SLP suspects likely due to pharyngeal residue after multiple thin liquids trials, but reduced oral control could also be a factor. SLP recommends continuing with ice chips by SLP only and will reassess order of Ice chips verse TSP next session to aid in determining cause of s/s aspiration. Pt completed EMST set at 20 cm H2O, 3 sets of 10.  Pt was left in room with call bell within reach and bed alarm set. SLP recommends to continue skilled services.     Pain Pain Assessment Pain Scale: 0-10 Pain Score: 0-No pain  Therapy/Group: Individual Therapy  Alanni Vader  [facility] 03/27/2021, 10:18 AM

## 2021-03-27 NOTE — Progress Notes (Signed)
Patient ID: Gregory [name], male   DOB: 05/05/1920, 88 y.o.   MRN: [id number]  SW spoke Rachel/representative with [facility] (p:[phone]/[phone]) to clinicals sent over. Confirms clinicals received, and currently under review per insurance. SW informed on d/c date and states that there will be a rush placed on order. Expecting follow-up sometime today.   [name], MSW, [place] Office: [phone] Cell: [phone] Fax: [phone]

## 2021-03-27 NOTE — Progress Notes (Signed)
Occupational Therapy Session Note  Patient Details  Name: Gregory [name] MRN: [id number] Date of Birth: Aug 09, 1920  Today's Date: 03/27/2021 OT Individual Time: 1415-1450 OT Individual Time Calculation (min): 35 min    Short Term Goals: Week 2:  OT Short Term Goal 1 (Week 2): STG=LTG 2/2 ELOS (continued working to supervision goals)  Skilled Therapeutic Interventions/Progress Updates:  Pt greeted supine in bed initially reporting fatigue and declining session but agreeable with increased encouragement.   Session focus on BADL reeducation, functional mobility, dynamic standing balance, increasing overall activity tolerance and decreasing overall caregiver burden.     Pt completed supine>sit with increased time and effort with CGA. Pt completed x2 sit<>stands form EOB with MODA with pt able to completed 2 sets x10 marches during each stand with MIN A for balance, MOD verbal cues for upright posture. Pt then reports need to void bowels, MOD A for stand pivot to [facility] with RW and MAX A for 3/3 toileting tasks. +BM/urine void. Pt  completed stand pivot back to bed with MOD A. Pt needed MOD A for sit>supine needing assist to elevate BLEs. pt left supine in bed with all needs within reach.                        Therapy Documentation Precautions:  Precautions Precautions: Fall Precaution Comments: NPO with PEG tube, posterior lean Restrictions Weight Bearing Restrictions: No  Pain: no pain reported during session     Therapy/Group: Individual Therapy  Corinne Ports [facility] 03/27/2021, 3:54 PM

## 2021-03-27 NOTE — Progress Notes (Signed)
PROGRESS NOTE   Subjective/Complaints:  No apparent problems over weekend except for feeling tired  ROS: Patient denies fever, rash, sore throat, blurred vision, dizziness, nausea, vomiting, diarrhea, cough, shortness of breath or chest pain, joint or back/neck pain, headache, or mood change. .       Objective:   No results found. Recent Labs    03/27/21 0611  WBC 8.7  HGB 8.9*  HCT 27.6*  PLT 198    Recent Labs    03/26/21 0604 03/27/21 0611  NA 139 136  K 4.2 4.3  CL 102 97*  CO2 26 31  GLUCOSE 88 98  BUN 35* 39*  CREATININE 1.40* 1.52*  CALCIUM 9.3 9.3     Intake/Output Summary (Last 24 hours) at 03/27/2021 1302 Last data filed at 03/27/2021 1505 Gross per 24 hour  Intake --  Output 1125 ml  Net -1125 ml     Pressure Injury 03/03/21 Heel Left Deep Tissue Pressure Injury - Purple or maroon localized area of discolored intact skin or blood-filled blister due to damage of underlying soft tissue from pressure and/or shear. (Active)  03/03/21 1114  Location: Heel  Location Orientation: Left  Staging: Deep Tissue Pressure Injury - Purple or maroon localized area of discolored intact skin or blood-filled blister due to damage of underlying soft tissue from pressure and/or shear.  Wound Description (Comments):   Present on Admission: Yes     Pressure Injury 03/10/21 Toe (Comment  which one) Anterior;Left;Distal Unstageable - Full thickness tissue loss in which the base of the injury is covered by slough (yellow, tan, gray, green or brown) and/or eschar (tan, brown or black) in the wound bed. (Active)  03/10/21 1730  Location: Toe (Comment  which one) (Left great toe at the tip.)  Location Orientation: Anterior;Left;Distal  Staging: Unstageable - Full thickness tissue loss in which the base of the injury is covered by slough (yellow, tan, gray, green or brown) and/or eschar (tan, brown or black) in the wound  bed.  Wound Description (Comments): Black area at the tip of the left great toe.  Present on Admission: Yes     Pressure Injury 03/10/21 Toe (Comment  which one) Anterior;Right;Distal Unstageable - Full thickness tissue loss in which the base of the injury is covered by slough (yellow, tan, gray, green or brown) and/or eschar (tan, brown or black) in the wound bed (Active)  03/10/21 1730  Location: Toe (Comment  which one) (Right great toe at the tip of the toe.)  Location Orientation: Anterior;Right;Distal  Staging: Unstageable - Full thickness tissue loss in which the base of the injury is covered by slough (yellow, tan, gray, green or brown) and/or eschar (tan, brown or black) in the wound bed.  Wound Description (Comments): Right great toe at the tip of the toe.  Present on Admission: Yes    Physical Exam: Vital Signs Blood pressure (!) 118/48, pulse 70, temperature 98.9 F (37.2 C), resp. rate 16, height 5\' 8"  (1.727 m), weight 104 kg, SpO2 96 %.   Constitutional: No distress, frail appearing . Vital signs reviewed. HEENT: NCAT, EOMI, oral membranes moist Neck: supple Cardiovascular: RRR without murmur. No JVD  Respiratory/Chest: CTA Bilaterally without wheezes or rales. Normal effort    GI/Abdomen: BS +, non-tender, non-distended Ext: no clubbing, cyanosis, or edema Psych: pleasant and cooperative  Skin: foot/heel wounds stable,  covered with dressings-foam- C/D/I Neuro:  HOH but able comprehend when he's able to hear. No focal language issues. Mild dysarthria. Strength grossly 4/5 bilateral UE/LE's. No major change noted today  No focal sensory abnl. No abnl tone.  Musculoskeletal: Full ROM, No pain with AROM or PROM in the neck, trunk, or extremities. Posture appropriate     Assessment/Plan: 1. Functional deficits which require 3+ hours per day of interdisciplinary therapy in a comprehensive inpatient rehab setting. Physiatrist is providing close team supervision and 24  hour management of active medical problems listed below. Physiatrist and rehab team continue to assess barriers to discharge/monitor patient progress toward functional and medical goals  Care Tool:  Bathing    Body parts bathed by patient: Right arm, Left arm, Chest, Buttocks, Front perineal area, Abdomen, Right upper leg, Left upper leg, Face   Body parts bathed by helper: Buttocks     Bathing assist Assist Level: Minimal Assistance - Patient > 75%     Upper Body Dressing/Undressing Upper body dressing   What is the patient wearing?: Pull over shirt    Upper body assist Assist Level: Minimal Assistance - Patient > 75%    Lower Body Dressing/Undressing Lower body dressing      What is the patient wearing?: Incontinence brief, Pants     Lower body assist Assist for lower body dressing: Moderate Assistance - Patient 50 - 74%     Toileting Toileting    Toileting assist Assist for toileting: Minimal Assistance - Patient > 75%     Transfers Chair/bed transfer  Transfers assist     Chair/bed transfer assist level: Moderate Assistance - Patient 50 - 74%     Locomotion Ambulation   Ambulation assist   Ambulation activity did not occur: Safety/medical concerns  Assist level: Contact Guard/Touching assist Assistive device: Walker-rolling Max distance: 150'   Walk 10 feet activity   Assist  Walk 10 feet activity did not occur: Safety/medical concerns  Assist level: Contact Guard/Touching assist Assistive device: Walker-rolling   Walk 50 feet activity   Assist Walk 50 feet with 2 turns activity did not occur: Safety/medical concerns  Assist level: Contact Guard/Touching assist Assistive device: Walker-rolling    Walk 150 feet activity   Assist Walk 150 feet activity did not occur: Safety/medical concerns  Assist level: Contact Guard/Touching assist Assistive device: Walker-rolling    Walk 10 feet on uneven surface  activity   Assist Walk 10  feet on uneven surfaces activity did not occur: Safety/medical concerns   Assist level: Minimal Assistance - Patient > 75% Assistive device: Walker-rolling   Wheelchair     Assist Is the patient using a wheelchair?: Yes Type of Wheelchair: Manual    Wheelchair assist level: Dependent - Patient 0%      Wheelchair 50 feet with 2 turns activity    Assist        Assist Level: Dependent - Patient 0%   Wheelchair 150 feet activity     Assist      Assist Level: Dependent - Patient 0%   Blood pressure (!) 118/48, pulse 70, temperature 98.9 F (37.2 C), resp. rate 16, height 5\' 8"  (1.727 m), weight 104 kg, SpO2 96 %.  Medical Problem List and Plan: 1. Functional deficits secondary to debility             -  patient may shower but PEG must be covered.              -ELOS/Goals: 2-3 weeks S-MinA             -d/c date 03/29/21 Doing ice chips with SLP- changed d/c to 2/15 due to IV ABX and progress to increase level of function 2/13 ongoing difficulty with weakness and activity tolerance with therapies.  2.  Antithrombotics: -DVT/anticoagulation:  Mechanical: Sequential compression devices, below knee Bilateral lower extremities 1/31- although Hb is stable, pt still having black tarry stools- will consult Cards about Eliquis- I think it's concerning to restart right now, but will let Cards decide.  - Cards and GI agree NO ELIQUIS or Blood thinners.              -antiplatelet therapy: N/A 3. Pain Management: Tylenol 4. Mood:  LCSW to follow              -antipsychotic agents: N/A 5. Neuropsych: This patient is capable of making decisions on his own behalf. 6. Skin/Wound Care: pressure relief, protection to feet/heels 7. Fluids/Electrolytes/Nutrition: continue TF  -hyperkalemia: lokelma 5mg  daily x 2 doses, recheck labs Monday  2/1- see below under hyperkalemia  2/6- K+ 3.8- will monitor  2/11- K+ 3.9- doing well  2/12- K+ 4.2--->>2/13 4.3 8. A flutter s/p TAVR 2020:  plan is to hold eliquis for now and resume in the next few weeks potentially --HR controlled 1/29 1/30- hold eliquis- likely still bleeding- will call Cards in AM to see their plan.  9. Severe dysphagia w/aspiration: NPO with tube feeds for nutritonal support.  10. GIB: Monitor for signs of bleeding. Continue Protonix and Carafate. Holding Eliquis.  1/30- Hb 8.9- was 10.2- but previous was 8.9 so likely not bleeding severely, but will recheck labs in AM 1/31- Hb stable at 8.9- will monitor 2/1- GI maximized PPI to BID and maintained carafate- will also add FeSol solution BID for low iron levels 11. CKD 3a: BUN/SCr 124/2.07             --improved to 35/1.4 on 1/28  1/30- Cr 1.42 and BUN 34- overall stable- con't to monitor  -monitor output, labs Monday   1/31- Cr down slightly to 1.33 and BUN 32- con't to monitor  2/1- wil recheck in AM  2/2- Cr 1.26 and BUN down to 30- from 36- doing better- con't to monitor  2/3- Cr 1.34 and BUN 31- is chronic/baseline- will reduce dose of Keflex as a result.   2/6- BUN better at 29 and Cr stable at 1.34- con't to monitor  2/13 BUN trending up to 39 over last 10 days.    -will run low rate IVF tonight   -push fluids 12. Constipation: Continue Senna daily. 2/2- LBM yesterday- is brown 2/6- LBM Friday- will give sorbitol today  2/7- LBM yesterday but Sorbitol hasn't worked yet- might need more- since BM yesterday was small-medium- will reorder Sorbitol again at 3pm 2/9- LBM yesterday- KUB shows moderate stool burden, but had BM after that.  13. HLD: continue atorvastatin 40mg  daily.  14. Hypothyroidism: continue Synthroid.  15. Hyperkalemia- likely due to TfF'- has PEG.   1/31- given multiple doses of Lokelma- down to 4.8- but will recheck again Thursday. 2/2- K+ 4.2- so doing much better on Nepro TF's  16. NPO-due to esophageal stricture/severe dysphagia on TF's  1/31- 1 reason came into hospital with failure to thrive due to not eating/drinking- per  chart review- SLP involved,  but not sure if will be able to not use PEG.   2/2- failed MBS - cannot have anything by mouth  2/4: continue feeding via PEG  2/6- no issues with PEG- using abd binder  2/8- doesn't need abd binder when In bed- only when up 17. ABLA  2/1- will add Feosol solution BID to help with blood loss. 24: hgb 8/6 on 2/3, monitor weekly  18. Leukocytosis- UTI  2/2- Mild- WBC 11.1- will check CXR since is NPO and U/A and Cx to try and find source and recheck in AM  2/3- WBC up to 12.8- afebrile- however urine "so turbid couldn't run U/A"- has many bacteria- will recheck again in 3-4 days per d/w pharmacy- wil start Keflex 500 mg q12 hours- x 7 days- might need longer treatment, but will determine next week. Urine cloudy., odorous as well per nursing.    2/4: UC with >100,000 colonies citrobacter, f/u sensititivities  2/6- Change to Cefepime per d/w pharmacy- willl need 7 days- might need to extend him? Will check if can do anything PO?  2/7- pharm suggests Septra, but agrees that 88 yr old, not great choice- really needs IV ABX- will keep until 2/13  2/8- con't IV ABX until Sunday- recheck labs Monday  2/13 septra completed. Wbc's normal 8.7 19. Constipation  2/8- will check KUB and treat as required based on results. 2/9- as above.   20. Myoclonus due to Cefepime-  2/10 added to allergy list- called Neuro- will check ammonia and also start Keppra 250 mg BID to help- per Neuro- Sx's should improve in a few days off Cefepime.   2/11- Neurology said could take 3-4 days to get better- hard to test since occurs when gets up- asked nursing to get to chair today.  -2/12-13 some improvement. Pt with a lot of just generalized weakness.   -Continue keppra for now  LOS: 17 days A FACE TO [place] 03/27/2021, 1:02 PM

## 2021-03-27 NOTE — Progress Notes (Signed)
Occupational Therapy Session Note  Patient Details  Name: Gregory [name] MRN: [id number] Date of Birth: [birth year]  Today's Date: 03/27/2021 OT Individual Time: 0700-0715 OT Individual Time Calculation (min): 15 min  and Today's Date: 03/27/2021 OT Missed Time: 60 Minutes Missed Time Reason: Patient fatigue   Short Term Goals: Week 3:  OT Short Term Goal 1 (Week 3): STG=LTG 2/2 ELOS (continued working to supervision goals)  Skilled Therapeutic Interventions/Progress Updates:    Pt sleeping upon arrival but easily aroused. Encouraged pt to sit EOB to start therapy this morning. Pt stated he was too tired to participate but agreeable to OTA retruning later. Pt continued to decline getting OOB during further attempts. Pt missed 60 mins skilled OT services 22 fatigue.   Therapy Documentation Precautions:  Precautions Precautions: Fall Precaution Comments: NPO with PEG tube, posterior lean Restrictions Weight Bearing Restrictions: No General: General OT Amount of Missed Time: 60 Minutes   Therapy/Group: Individual Therapy  Leroy Libman 03/27/2021, 8:08 AM

## 2021-03-27 NOTE — Progress Notes (Signed)
Physical Therapy Session Note  Patient Details  Name: Gregory [name] MRN: [id number] Date of Birth: 1920-05-07  Today's Date: 03/27/2021 PT Individual Time: 1100-1200 PT Individual Time Calculation (min): 60 min   Short Term Goals: Week 2:  PT Short Term Goal 1 (Week 2): =LTG due to ELOS  Skilled Therapeutic Interventions/Progress Updates:    Pt received supine in bed using urinal with assist from NT. Once pt done urinating agreeable to PT session. No complaints of pain this AM, just reports feeling "weak". Supine to sit with mod A for trunk control with increased time needed, HOB elevated and use of bedrail. Sit to stand with mod to max A to RW during session with increased cues needed for anterior weight shift during transfer this date. Stand pivot transfer to w/c with RW and mod A with increased time and cues, pt taking very small, shuffling steps. With max encouragement pt agreeable to leave room for therapy session. Nustep level 3 x 10 min with use of B UE/LE for global endurance training. Pt agreeable to attempt gait training this AM, however he exhibits increased difficulty with standing and transfers with RW following Nustep due to fatigue. Pt returned to room, agreeable to remain seated in w/c. Pt left seated in w/c in room with needs in reach at end of session.  Therapy Documentation Precautions:  Precautions Precautions: Fall Precaution Comments: NPO with PEG tube, posterior lean Restrictions Weight Bearing Restrictions: No      Therapy/Group: Individual Therapy   Excell Seltzer, PT, DPT, CSRS  03/27/2021, 12:20 PM

## 2021-03-28 LAB — BASIC METABOLIC PANEL
Anion gap: 8 (ref 5–15)
BUN: 37 mg/dL — ABNORMAL HIGH (ref 8–23)
CO2: 31 mmol/L (ref 22–32)
Calcium: 9.4 mg/dL (ref 8.9–10.3)
Chloride: 98 mmol/L (ref 98–111)
Creatinine, Ser: 1.39 mg/dL — ABNORMAL HIGH (ref 0.61–1.24)
GFR, Estimated: 46 mL/min — ABNORMAL LOW (ref >60)
Glucose, Bld: 93 mg/dL (ref 70–99)
Potassium: 4.1 mmol/L (ref 3.5–5.1)
Sodium: 137 mmol/L (ref 135–145)

## 2021-03-28 LAB — GLUCOSE, CAPILLARY
Glucose-Capillary: 100 mg/dL — ABNORMAL HIGH (ref 70–99)
Glucose-Capillary: 109 mg/dL — ABNORMAL HIGH (ref 70–99)
Glucose-Capillary: 112 mg/dL — ABNORMAL HIGH (ref 70–99)
Glucose-Capillary: 83 mg/dL (ref 70–99)
Glucose-Capillary: 92 mg/dL (ref 70–99)
Glucose-Capillary: 94 mg/dL (ref 70–99)
Glucose-Capillary: 98 mg/dL (ref 70–99)

## 2021-03-28 MED ORDER — FREE WATER
300.0000 mL | Freq: Four times a day (QID) | Status: DC
  Administered 2021-03-28 – 2021-03-31 (×12): 300 mL

## 2021-03-28 NOTE — Progress Notes (Signed)
Nutrition Follow-up  DOCUMENTATION CODES:   Severe malnutrition in context of chronic illness  INTERVENTION:  Continue bolus feeds via PEG using Nepro formula at goal volume of 315 ml (10.5 ounces) given TID. (Total of 4 cartons/day)   Increase free water flushes to 300 ml every 6 hours per tube.    Tube feeding regimen to provide 1701 kcal, 77 grams of protein, and 1888 mL total free water.   NUTRITION DIAGNOSIS:   Severe Malnutrition related to chronic illness (dysphagia, afib) as evidenced by severe fat depletion, severe muscle depletion; ongoing  GOAL:   Patient will meet greater than or equal to 90% of their needs; met with TF  MONITOR:   Labs, Weight trends, Skin  REASON FOR ASSESSMENT:   New TF    ASSESSMENT:   88 year old male with history of A Flutter s/p ablation w/PPM, HTN, neuropathy, lung nodule, TVAR, HOH, dysphagia s/p G tube (placed 02/10/21) who was admitted on 03/02/21 with hypotension, dizziness, near syncope and hgb 5.9. FBOT positive with reports of dark stools as well as reports of Hgb of 7.0 and trauma to GI tract due to NGT per family. CIR recommended due to functional decline.  RD received a consult to increase pt free water flushes due to increasing BUN.  Will increase free water flushes to 300 mL q6h. Messaged LPN to inform of change.  Medications reviewed and include: Ferrous Sulfate, Protonix, Phosphorus, Senokot, Carafate Labs reviewed: BUN 37, Creatinine 1.39   Diet Order:   Diet Order             Diet NPO time specified Except for: Ice Chips  Diet effective now                   EDUCATION NEEDS:   Not appropriate for education at this time  Skin:  Skin Assessment: Reviewed RN Assessment Skin Integrity Issues:: DTI, Other (Comment) DTI: L heel Other: PI sacrum  Last BM:  2/13  Height:   Ht Readings from Last 1 Encounters:  03/10/21 '5\' 8"'  (1.727 m)    Weight:   Wt Readings from Last 1 Encounters:  03/26/21 104 kg    BMI:  Body mass index is 34.86 kg/m.  Estimated Nutritional Needs:   Kcal:  1650-1850  Protein:  75-90 grams  Fluid:  >/= 1.65 L/day   Roxana Hires, RD, LDN Clinical Dietitian See [facility] for contact information.

## 2021-03-28 NOTE — Progress Notes (Signed)
PROGRESS NOTE   Subjective/Complaints:  Pt still tired-  Still feels weaker- can now stand, but not walk and had been walking 150 ft.  Getting TF's this AM.     ROS:  Pt denies SOB, abd pain, CP, N/V/C/D, and vision changes     Objective:   No results found. Recent Labs    03/27/21 0611  WBC 8.7  HGB 8.9*  HCT 27.6*  PLT 198    Recent Labs    03/27/21 0611 03/28/21 0635  NA 136 137  K 4.3 4.1  CL 97* 98  CO2 31 31  GLUCOSE 98 93  BUN 39* 37*  CREATININE 1.52* 1.39*  CALCIUM 9.3 9.4     Intake/Output Summary (Last 24 hours) at 03/28/2021 1020 Last data filed at 03/28/2021 0949 Gross per 24 hour  Intake 700 ml  Output 1900 ml  Net -1200 ml     Pressure Injury 03/03/21 Heel Left Deep Tissue Pressure Injury - Purple or maroon localized area of discolored intact skin or blood-filled blister due to damage of underlying soft tissue from pressure and/or shear. (Active)  03/03/21 1114  Location: Heel  Location Orientation: Left  Staging: Deep Tissue Pressure Injury - Purple or maroon localized area of discolored intact skin or blood-filled blister due to damage of underlying soft tissue from pressure and/or shear.  Wound Description (Comments):   Present on Admission: Yes     Pressure Injury 03/10/21 Toe (Comment  which one) Anterior;Left;Distal Unstageable - Full thickness tissue loss in which the base of the injury is covered by slough (yellow, tan, gray, green or brown) and/or eschar (tan, brown or black) in the wound bed. (Active)  03/10/21 1730  Location: Toe (Comment  which one) (Left great toe at the tip.)  Location Orientation: Anterior;Left;Distal  Staging: Unstageable - Full thickness tissue loss in which the base of the injury is covered by slough (yellow, tan, gray, green or brown) and/or eschar (tan, brown or black) in the wound bed.  Wound Description (Comments): Black area at the tip of the  left great toe.  Present on Admission: Yes     Pressure Injury 03/10/21 Toe (Comment  which one) Anterior;Right;Distal Unstageable - Full thickness tissue loss in which the base of the injury is covered by slough (yellow, tan, gray, green or brown) and/or eschar (tan, brown or black) in the wound bed (Active)  03/10/21 1730  Location: Toe (Comment  which one) (Right great toe at the tip of the toe.)  Location Orientation: Anterior;Right;Distal  Staging: Unstageable - Full thickness tissue loss in which the base of the injury is covered by slough (yellow, tan, gray, green or brown) and/or eschar (tan, brown or black) in the wound bed.  Wound Description (Comments): Right great toe at the tip of the toe.  Present on Admission: Yes    Physical Exam: Vital Signs Blood pressure 125/61, pulse 71, temperature 98 F (36.7 C), resp. rate 16, height 5\' 8"  (1.727 m), weight 104 kg, SpO2 93 %.    General: awake, alert, appropriate, laying in bed supine- getting TF's by nursing; NAD HENT: conjugate gaze; oropharynx moist CV: regular rate; no JVD Pulmonary: CTA  B/L; no W/R/R- good air movement GI: soft, NT, ND, (+)BS Psychiatric: appropriate Neurological: Ox3- very HOH-  Ext: no clubbing, cyanosis, or edema Psych: pleasant and cooperative  Skin: foot/heel wounds stable,  covered with dressings-foam- C/D/I Neuro:  HOH but able comprehend when he's able to hear. No focal language issues. Mild dysarthria. Strength grossly 4/5 bilateral UE/LE's. No major change noted today  No focal sensory abnl. No abnl tone.  Musculoskeletal: Full ROM, No pain with AROM or PROM in the neck, trunk, or extremities. Posture appropriate     Assessment/Plan: 1. Functional deficits which require 3+ hours per day of interdisciplinary therapy in a comprehensive inpatient rehab setting. Physiatrist is providing close team supervision and 24 hour management of active medical problems listed below. Physiatrist and rehab  team continue to assess barriers to discharge/monitor patient progress toward functional and medical goals  Care Tool:  Bathing    Body parts bathed by patient: Right arm, Left arm, Chest, Buttocks, Front perineal area, Abdomen, Right upper leg, Left upper leg, Face   Body parts bathed by helper: Buttocks     Bathing assist Assist Level: Minimal Assistance - Patient > 75%     Upper Body Dressing/Undressing Upper body dressing   What is the patient wearing?: Pull over shirt    Upper body assist Assist Level: Minimal Assistance - Patient > 75%    Lower Body Dressing/Undressing Lower body dressing      What is the patient wearing?: Incontinence brief, Pants     Lower body assist Assist for lower body dressing: Moderate Assistance - Patient 50 - 74%     Toileting Toileting    Toileting assist Assist for toileting: Maximal Assistance - Patient 25 - 49%     Transfers Chair/bed transfer  Transfers assist     Chair/bed transfer assist level: Moderate Assistance - Patient 50 - 74%     Locomotion Ambulation   Ambulation assist   Ambulation activity did not occur: Safety/medical concerns  Assist level: Contact Guard/Touching assist Assistive device: Walker-rolling Max distance: 150'   Walk 10 feet activity   Assist  Walk 10 feet activity did not occur: Safety/medical concerns  Assist level: Contact Guard/Touching assist Assistive device: Walker-rolling   Walk 50 feet activity   Assist Walk 50 feet with 2 turns activity did not occur: Safety/medical concerns  Assist level: Contact Guard/Touching assist Assistive device: Walker-rolling    Walk 150 feet activity   Assist Walk 150 feet activity did not occur: Safety/medical concerns  Assist level: Contact Guard/Touching assist Assistive device: Walker-rolling    Walk 10 feet on uneven surface  activity   Assist Walk 10 feet on uneven surfaces activity did not occur: Safety/medical  concerns   Assist level: Minimal Assistance - Patient > 75% Assistive device: Walker-rolling   Wheelchair     Assist Is the patient using a wheelchair?: Yes Type of Wheelchair: Manual    Wheelchair assist level: Dependent - Patient 0%      Wheelchair 50 feet with 2 turns activity    Assist        Assist Level: Dependent - Patient 0%   Wheelchair 150 feet activity     Assist      Assist Level: Dependent - Patient 0%   Blood pressure 125/61, pulse 71, temperature 98 F (36.7 C), resp. rate 16, height 5\' 8"  (1.727 m), weight 104 kg, SpO2 93 %.  Medical Problem List and Plan: 1. Functional deficits secondary to debility             -  patient may shower but PEG must be covered.              -ELOS/Goals: 2-3 weeks S-MinA             -d/c date 03/29/21 Doing ice chips with SLP- changed d/c to 2/15 due to IV ABX and progress to increase level of function 2/13 ongoing difficulty with weakness and activity tolerance with therapies. 2/14- team conference today to determine new d/c dat-e will need ot move out til next week. Con't CIR  2.  Antithrombotics: -DVT/anticoagulation:  Mechanical: Sequential compression devices, below knee Bilateral lower extremities 1/31- although Hb is stable, pt still having black tarry stools- will consult Cards about Eliquis- I think it's concerning to restart right now, but will let Cards decide.  - Cards and GI agree NO ELIQUIS or Blood thinners.              -antiplatelet therapy: N/A 3. Pain Management: Tylenol 4. Mood:  LCSW to follow              -antipsychotic agents: N/A 5. Neuropsych: This patient is capable of making decisions on his own behalf. 6. Skin/Wound Care: pressure relief, protection to feet/heels 7. Fluids/Electrolytes/Nutrition: continue TF  -hyperkalemia: lokelma 5mg  daily x 2 doses, recheck labs Monday  2/1- see below under hyperkalemia  2/6- K+ 3.8- will monitor  2/11- K+ 3.9- doing well  2/12- K+ 4.2--->>2/13  4.3 8. A flutter s/p TAVR 2020: plan is to hold eliquis for now and resume in the next few weeks potentially --HR controlled 1/29 1/30- hold eliquis- likely still bleeding- will call Cards in AM to see their plan.  9. Severe dysphagia w/aspiration: NPO with tube feeds for nutritonal support.  10. GIB: Monitor for signs of bleeding. Continue Protonix and Carafate. Holding Eliquis.  1/30- Hb 8.9- was 10.2- but previous was 8.9 so likely not bleeding severely, but will recheck labs in AM 1/31- Hb stable at 8.9- will monitor 2/1- GI maximized PPI to BID and maintained carafate- will also add FeSol solution BID for low iron levels 11. CKD 3a: BUN/SCr 124/2.07             --improved to 35/1.4 on 1/28  1/30- Cr 1.42 and BUN 34- overall stable- con't to monitor  -monitor output, labs Monday   1/31- Cr down slightly to 1.33 and BUN 32- con't to monitor  2/1- wil recheck in AM  2/2- Cr 1.26 and BUN down to 30- from 36- doing better- con't to monitor  2/3- Cr 1.34 and BUN 31- is chronic/baseline- will reduce dose of Keflex as a result.   2/6- BUN better at 29 and Cr stable at 1.34- con't to monitor  2/13 BUN trending up to 39 over last 10 days.    -will run low rate IVF tonight  2/14- will need more water flushes with BUN trending up- will ask nutrition to help out.  12. Constipation: Continue Senna daily. 2/14- LBM 2 days ago 13. HLD: continue atorvastatin 40mg  daily.  14. Hypothyroidism: continue Synthroid.  15. Hyperkalemia- likely due to TfF'- has PEG.   1/31- given multiple doses of Lokelma- down to 4.8- but will recheck again Thursday. 2/2- K+ 4.2- so doing much better on Nepro TF's  16. NPO-due to esophageal stricture/severe dysphagia on TF's  1/31- 1 reason came into hospital with failure to thrive due to not eating/drinking- per chart review- SLP involved, but not sure if will be able to not use PEG.  2/2- failed MBS - cannot have anything by mouth  2/4: continue feeding via PEG  2/6-  no issues with PEG- using abd binder  2/8- doesn't need abd binder when In bed- only when up 17. ABLA  2/1- will add Feosol solution BID to help with blood loss. 24: hgb 8/6 on 2/3, monitor weekly  18. Leukocytosis- UTI  2/2- Mild- WBC 11.1- will check CXR since is NPO and U/A and Cx to try and find source and recheck in AM  2/3- WBC up to 12.8- afebrile- however urine "so turbid couldn't run U/A"- has many bacteria- will recheck again in 3-4 days per d/w pharmacy- wil start Keflex 500 mg q12 hours- x 7 days- might need longer treatment, but will determine next week. Urine cloudy., odorous as well per nursing.    2/4: UC with >100,000 colonies citrobacter, f/u sensititivities  2/6- Change to Cefepime per d/w pharmacy- willl need 7 days- might need to extend him? Will check if can do anything PO?  2/7- pharm suggests Septra, but agrees that 88 yr old, not great choice- really needs IV ABX- will keep until 2/13  2/8- con't IV ABX until Sunday- recheck labs Monday  2/13 septra completed. Wbc's normal 8.7 19. Constipation  2/8- will check KUB and treat as required based on results. 2/9- as above.   20. Myoclonus due to Cefepime-  2/10 added to allergy list- called Neuro- will check ammonia and also start Keppra 250 mg BID to help- per Neuro- Sx's should improve in a few days off Cefepime.   2/11- Neurology said could take 3-4 days to get better- hard to test since occurs when gets up- asked nursing to get to chair today.  -2/12-13 some improvement. Pt with a lot of just generalized weakness.   2/14- will stop Keppra since myoclonus has resolved and monitor for resurgence.    I spent a total of  51  minutes on total care today- >50% coordination of care- due to team conference and d/w daughter and SW about pt's slower recovery.    LOS: 18 days A FACE TO FACE EVALUATION WAS PERFORMED  Scout Gumbs 03/28/2021, 10:20 AM

## 2021-03-28 NOTE — Progress Notes (Signed)
Physical Therapy Note  Patient Details  Name: Gregory [name] MRN: [id number] Date of Birth: 20-Jan-1921 Today's Date: 03/28/2021   Attempted to see patient for scheduled therapy session, pt received asleep in bed. Pt arousable but declines participation in therapy session at this time due to fatigue, requests that therapist return at a later time. Will follow up as time allows per POC. Pt missed 60 min of scheduled therapy session due to fatigue and refusal.    Excell Seltzer, PT, DPT, CSRS  03/28/2021, 9:10 AM

## 2021-03-28 NOTE — Progress Notes (Signed)
Physical Therapy Weekly Progress Note  Patient Details  Name: Gregory [name] MRN: [id number] Date of Birth: 22-Nov-1920  Beginning of progress report period: March 19, 2021 End of progress report period: March 28, 2021  Today's Date: 03/28/2021 PT Individual Time: 1630-1710 PT Individual Time Calculation (min): 40 min   Patient has met 0 of 1 short term goals.  STG were set to LTG due to ELOS. However, pt has had a medical and functional setback over the past week with onset of myoclonic movements in BUE and BLE. He also has regressed from being at [facility] level overall with use of RW, able to ambulate 150 ft (+) with RW, and being able to navigate stairs to being mod to max A for sit to stand transfers, mod A for stand pivots with RW, and inability to safely ambulate due to significant LE weakness. He requires an extension of his stay on CIR in order to regain independence with functional mobility and be able to return home safely with assist from family.  Patient continues to demonstrate the following deficits muscle weakness, decreased cardiorespiratoy endurance, and decreased sitting balance, decreased standing balance, decreased postural control, and decreased balance strategies and therefore will continue to benefit from skilled PT intervention to increase functional independence with mobility.  Patient progressing toward long term goals..  Continue plan of care.  PT Short Term Goals Week 2:  PT Short Term Goal 1 (Week 2): =LTG due to ELOS PT Short Term Goal 1 - Progress (Week 2): Progressing toward goal Week 3:  PT Short Term Goal 1 (Week 3): =LTG due to ELOS  Skilled Therapeutic Interventions/Progress Updates:    Pt received seated in w/c in room, agreeable to PT session. Pt reports pain in his buttocks from sitting up in w/c. Sit to stand with max A to RW initially during session, progression to mod A for transfer. Stand pivot transfers w/c to/from mat table with min to mod A for  balance. Sidesteps L/R with RW and CGA for balance. Pt reports urge to have a BM. Stand pivot transfer back to w/c then w/c to toilet with RW and mod A. Pt able to continently void while seated on toilet, total A for pericare and clothing management. Pt agreeable to attempt short distance ambulation in his room. Ambulation x 10 ft with RW and mod A for balance from w/c to bed. Pt encouraged by minor progress this date. Sit to supine mod A required for BLE management. Pt left seated in bed with needs in reach, daughter present.  Therapy Documentation Precautions:  Precautions Precautions: Fall Precaution Comments: NPO with PEG tube, posterior lean Restrictions Weight Bearing Restrictions: No     Therapy/Group: Individual Therapy   Excell Seltzer, PT, DPT, CSRS 03/28/2021, 5:32 PM

## 2021-03-28 NOTE — Progress Notes (Signed)
Occupational Therapy Session Note  Patient Details  Name: Matix Henshaw MRN: [id number] Date of Birth: 17-Aug-1920  Today's Date: 03/28/2021 OT Individual Time: 3845-3646 OT Individual Time Calculation (min): 60 min    Short Term Goals: Week 2:  OT Short Term Goal 1 (Week 2): STG=LTG 2/2 ELOS (continued working to supervision goals)  Skilled Therapeutic Interventions/Progress Updates:    Pt resting in bed upon arrival with son present. Pt agreeable to getting OOB with min encouragement. Supine>sit EOB with mod A. Sitting balance EOB with min A. Sit<>stand from EOB with max A using RW. Stand pivot transfer to w/c with min A. Grooming at sink to include washing face, shaving, and combing hair. Pt required mod A for shaving. Pt stated he prefers to sit in w/c vs recliner. Pt more interactive and engaging this morning. No myoclonus noted this morning. Pt remained sitting in w/c with seat alarm activated. All needs within reach and son present.   Therapy Documentation Precautions:  Precautions Precautions: Fall Precaution Comments: NPO with PEG tube, posterior lean Restrictions Weight Bearing Restrictions: No General: General OT Amount of Missed Time: 15 Minutes  Vital Signs:   Pain: Pain Assessment Pain Scale: 0-10 Pain Score: 0-No pain   Therapy/Group: Individual Therapy  Leroy Libman 03/28/2021, 10:46 AM

## 2021-03-28 NOTE — Patient Care Conference (Signed)
Inpatient RehabilitationTeam Conference and Plan of Care Update Date: 03/28/2021   Time: 11:07 AM    Patient Name: Gregory [name]      Medical Record Number: [id number]  Date of Birth: 25-Nov-1920 Sex: Male         Room/Bed: 4M12C/4M12C-01 Payor Info: Payor: MEDICARE / Plan: MEDICARE PART A AND B / Product Type: *No Product type* /    Admit Date/Time:  03/10/2021  5:21 PM  Primary Diagnosis:  [facility] Problems: Principal Problem:   Debility    Expected Discharge Date: Expected Discharge Date: 04/04/21  Team Members Present: Physician leading conference: Dr. Courtney Heys Social Worker Present: [name], [place] Nurse Present: Dorthula Nettles, RN PT Present: Excell Seltzer, PT OT Present: Willeen Cass, OT;Roanna Epley, COTA SLP Present: Charolett Bumpers, SLP PPS Coordinator present : Gunnar Fusi, SLP     Current Status/Progress Goal Weekly Team Focus  Bowel/Bladder   Continent of B/B LBM:03/27/2021  Remain continent  Frequent toileting Q shift and PRN   Swallow/Nutrition/ Hydration   NPO, ice chips with ST and PRN with familu  Supervision A  education   ADL's   bed mobility-mod A, sit<>stand-mod/max A: functional transfers-mod A with RW, increased fatigue  supervision overall  endurance, BADLs, tranfsers, education, discharge planning   Mobility   mod A bed mobility, mod to max A sit to stand, mod A transfer with RW, unable to ambulate due to LE weakness  Supervision overall, CGA stairs  endurance, balance, transfers, LE strengthening, gait as able   Communication             Safety/Cognition/ Behavioral Observations            Pain   denies pain  remain pain free  assess pain q 4hr and prn   Skin   Left heel DTI. Unstageable necrotcic/black toes  decrease any other skin breakdown  Assess Qshift and PRN     Discharge Planning:  D/c to home with his son Gregory [name] who will provide 24/7 care; PRN assistance from dtr [name]. Pt on TF prior to admission and  will remain on TF at discharge. HHA- [facility] for HHPT/OT/SLP/aide/SN. Pt has hospital bed ordered through [place] in place. Enteral Feed change from Nutren 1.5 to Nepro. [facility] to provide new TF changes. Pt will ahve homecare with [facility].   Team Discussion: Myoclonus resolved. Discontinued Keppra, completed antibiotic for UTI. K+ improving. Cr and BUN increased. Please increase water flushes. Continent B/B, continue treatment of left heel DTI and necrotic toes. Requesting no more brief on patient due to continence. Palliative consult requested. Affect improved. Will change to 15/7, will make weekend priority.   Patient on target to meet rehab goals: yes, supervision overall, contact guard stairs. Mod assist bed mobility. Patient has declined since last week. SLP discharged patient, had education with son.  *See Care Plan and progress notes for long and short-term goals.   Revisions to Treatment Plan:  Adjusting medications, 15/7 therapy, SLP discharged.   Teaching Needs: Family education, medication management, skin/wound care, safety awareness, transfer/gait training, etc.   Current Barriers to Discharge: No current barriers  Possible Resolutions to Barriers: Continue family education Consult Palliative Care     Medical Summary Current Status: done with ABX for UTI- continent B/B- no pain; multiple skin issues- stable- stop briefs if possible. very HOH;  Barriers to Discharge: Home enviroment access/layout;Nutrition means;Decreased family/caregiver support;Medical stability;Weight  Barriers to Discharge Comments: has ot have TFs- due to NPO  status- declined in function a lot- mod-max A sit-stand- cannot walk anymore was 125ft. Possible Resolutions to [employer]: focus- will get palliative care for goals of care, possible DNR; myoclonus finally gone- will d/c Keppra- SLP sign off- prn ice chips- d/c 2/21   Continued Need for Acute  Rehabilitation Level of Care: The patient requires daily medical management by a physician with specialized training in physical medicine and rehabilitation for the following reasons: Direction of a multidisciplinary physical rehabilitation program to maximize functional independence : Yes Medical management of patient stability for increased activity during participation in an intensive rehabilitation regime.: Yes Analysis of laboratory values and/or radiology reports with any subsequent need for medication adjustment and/or medical intervention. : Yes   I attest that I was present, lead the team conference, and concur with the assessment and plan of the team.   Cristi Loron 03/28/2021, 3:50 PM

## 2021-03-28 NOTE — Progress Notes (Signed)
Speech Language Pathology Daily Session Note  Patient Details  Name: Desi Rowe MRN: [id number] Date of Birth: April 07, 1920  Today's Date: 03/28/2021 SLP Individual Time: 1100-1130 SLP Individual Time Calculation (min): 30 min  Short Term Goals: Week 3: SLP Short Term Goal 1 (Week 3): STGs=LTGs due to ELOS  Skilled Therapeutic Interventions: Skilled treatment session focused on dysphagia goals. Upon arrival, patient was awake in wheelchair. SLP facilitated session by providing set-up assist for oral care via the suction toothbrush which required assistance from SLP for thoroughness. Patient performed 15 repetitions of both EMST and IMST with Min verbal cues and extra time needed for accuracy. Patient with intermittent oral expectoration of mucous and was indifferent when offered ice chips. Due to time constraints, ice chips were not administered. Patient's overall vocal quality was extremely low impacting intelligibility but as session continued and after RMT exercises, patient's overall vocal intensity improved with improved speech intelligibility. Overall, patient appeared much brighter and more engaged today. Patient left upright in wheelchair with all needs within reach. Continue with current plan of care.      Pain Pain Assessment Pain Scale: 0-10 Pain Score: 0-No pain  Therapy/Group: Individual Therapy  Kalyani Maeda 03/28/2021, 12:03 PM

## 2021-03-28 NOTE — Progress Notes (Signed)
Physical Therapy Note  Patient Details  Name: Gregory [name] MRN: [id number] Date of Birth: [birth year] Today's Date: 03/28/2021    Pt's plan of care adjusted to 15/7 after speaking with care team and discussed with MD in team conference as pt currently unable to tolerate current therapy schedule with OT, PT, and SLP.     Excell Seltzer, PT, DPT, CSRS  03/28/2021, 11:15 AM

## 2021-03-28 NOTE — Progress Notes (Signed)
Patient ID: Gregory [name], male   DOB: [birth year], 88 y.o.   MRN: [id number]  SW called pt dtr Gregory [name] [phone]) as reported by nursing she had concerns. Pt dtr reported she was concerned about his discharge to home tomorrow. SW informed will follow-up after team conference today on decision made.   SW met with pt in room to inform on change in d/c date change from 12/15 to 2/21.   *SW later met with pt dtr and son in hallway to discuss above. Family remains concerned about the limited progress he has made. SW encouraged them both to go with him to therapies to see what progress will be made through his discharge. SW spoke with representative with [facility] who wanted to confirm there was a change in pt d/c. States to call 2pm before day of discharge to ensure shipment arrives on time. SW informed pt will be sent with supply of enteral feeds at time of discharge. Also reports Nepro on back order, and pt will d/c withTF equivalent- Novasource.   [name], MSW, [place] Office: [phone] Cell: [phone] Fax: [phone]

## 2021-03-29 DIAGNOSIS — Z515 Encounter for palliative care: Secondary | ICD-10-CM

## 2021-03-29 LAB — GLUCOSE, CAPILLARY
Glucose-Capillary: 107 mg/dL — ABNORMAL HIGH (ref 70–99)
Glucose-Capillary: 113 mg/dL — ABNORMAL HIGH (ref 70–99)
Glucose-Capillary: 79 mg/dL (ref 70–99)
Glucose-Capillary: 98 mg/dL (ref 70–99)

## 2021-03-29 NOTE — Progress Notes (Signed)
Progress note:  Consult received and acknowledged.  After reviewing the chart, I assessed the patient at bedside.  Patient's two children were present and had concerns regarding mouth care being performed by nursing.  AS son was explaining his issue with mouth care physical therapy entered the room to address their concerns.    Patient commented he wanted everyone to go into the hallway instead of "staying in here to yell at each other".  Daughter asked if I would return at a different date since patient seems overwhelmed and felt like people were yelling. I highlighted that I am an extra layer of support to patient and family and do not want to be intrusive. I shared I will return tomorrow in hopes of continuing palliative discussions.   I plan to speak with patient and family again tomorrow, 2/16.  [place] Ilsa Iha, FNP-BC Palliative Medicine Team Team Phone # [phone]  No charge

## 2021-03-29 NOTE — Progress Notes (Signed)
PROGRESS NOTE   Subjective/Complaints:  Pt reports he's sleeping- doesn't know if any issues- denies pain, problems.  Knows new d/c date.    ROS:   Pt denies SOB, abd pain, CP, N/V/C/D, and vision changes   Objective:   No results found. Recent Labs    03/27/21 0611  WBC 8.7  HGB 8.9*  HCT 27.6*  PLT 198    Recent Labs    03/27/21 0611 03/28/21 0635  NA 136 137  K 4.3 4.1  CL 97* 98  CO2 31 31  GLUCOSE 98 93  BUN 39* 37*  CREATININE 1.52* 1.39*  CALCIUM 9.3 9.4     Intake/Output Summary (Last 24 hours) at 03/29/2021 0758 Last data filed at 03/29/2021 0610 Gross per 24 hour  Intake --  Output 1850 ml  Net -1850 ml     Pressure Injury 03/03/21 Heel Left Deep Tissue Pressure Injury - Purple or maroon localized area of discolored intact skin or blood-filled blister due to damage of underlying soft tissue from pressure and/or shear. (Active)  03/03/21 1114  Location: Heel  Location Orientation: Left  Staging: Deep Tissue Pressure Injury - Purple or maroon localized area of discolored intact skin or blood-filled blister due to damage of underlying soft tissue from pressure and/or shear.  Wound Description (Comments):   Present on Admission: Yes     Pressure Injury 03/10/21 Toe (Comment  which one) Anterior;Left;Distal Unstageable - Full thickness tissue loss in which the base of the injury is covered by slough (yellow, tan, gray, green or brown) and/or eschar (tan, brown or black) in the wound bed. (Active)  03/10/21 1730  Location: Toe (Comment  which one) (Left great toe at the tip.)  Location Orientation: Anterior;Left;Distal  Staging: Unstageable - Full thickness tissue loss in which the base of the injury is covered by slough (yellow, tan, gray, green or brown) and/or eschar (tan, brown or black) in the wound bed.  Wound Description (Comments): Black area at the tip of the left great toe.  Present on  Admission: Yes     Pressure Injury 03/10/21 Toe (Comment  which one) Anterior;Right;Distal Unstageable - Full thickness tissue loss in which the base of the injury is covered by slough (yellow, tan, gray, green or brown) and/or eschar (tan, brown or black) in the wound bed (Active)  03/10/21 1730  Location: Toe (Comment  which one) (Right great toe at the tip of the toe.)  Location Orientation: Anterior;Right;Distal  Staging: Unstageable - Full thickness tissue loss in which the base of the injury is covered by slough (yellow, tan, gray, green or brown) and/or eschar (tan, brown or black) in the wound bed.  Wound Description (Comments): Right great toe at the tip of the toe.  Present on Admission: Yes    Physical Exam: Vital Signs Blood pressure (!) 123/51, pulse 71, temperature (!) 97.4 F (36.3 C), resp. rate 16, height 5\' 8"  (1.727 m), weight 103 kg, SpO2 96 %.     General: awake, alert, appropriate, woke to stimuli; not wearing hearing aids- sleepy- just woke up;  NAD HENT: conjugate gaze; oropharynx moist CV: regular rate; no JVD Pulmonary: CTA B/L; no W/R/R-  good air movement GI: soft, NT, ND, (+)BS Psychiatric: appropriate but sleepy Neurological: Alert- HOH; sleepy Ext: no clubbing, cyanosis, or edema Psych: pleasant and cooperative  Skin: foot/heel wounds stable,  covered with dressings-foam- C/D/I Neuro:  HOH but able comprehend when he's able to hear. No focal language issues. Mild dysarthria. Strength grossly 4/5 bilateral UE/LE's. No major change noted today  No focal sensory abnl. No abnl tone.  Musculoskeletal: Full ROM, No pain with AROM or PROM in the neck, trunk, or extremities. Posture appropriate     Assessment/Plan: 1. Functional deficits which require 3+ hours per day of interdisciplinary therapy in a comprehensive inpatient rehab setting. Physiatrist is providing close team supervision and 24 hour management of active medical problems listed  below. Physiatrist and rehab team continue to assess barriers to discharge/monitor patient progress toward functional and medical goals  Care Tool:  Bathing    Body parts bathed by patient: Right arm, Left arm, Chest, Buttocks, Front perineal area, Abdomen, Right upper leg, Left upper leg, Face   Body parts bathed by helper: Buttocks     Bathing assist Assist Level: Minimal Assistance - Patient > 75%     Upper Body Dressing/Undressing Upper body dressing   What is the patient wearing?: Pull over shirt    Upper body assist Assist Level: Minimal Assistance - Patient > 75%    Lower Body Dressing/Undressing Lower body dressing      What is the patient wearing?: Incontinence brief, Pants     Lower body assist Assist for lower body dressing: Moderate Assistance - Patient 50 - 74%     Toileting Toileting    Toileting assist Assist for toileting: Maximal Assistance - Patient 25 - 49%     Transfers Chair/bed transfer  Transfers assist     Chair/bed transfer assist level: Moderate Assistance - Patient 50 - 74%     Locomotion Ambulation   Ambulation assist   Ambulation activity did not occur: Safety/medical concerns  Assist level: Moderate Assistance - Patient 50 - 74% Assistive device: Walker-rolling Max distance: 10'   Walk 10 feet activity   Assist  Walk 10 feet activity did not occur: Safety/medical concerns  Assist level: Moderate Assistance - Patient - 50 - 74% Assistive device: Walker-rolling   Walk 50 feet activity   Assist Walk 50 feet with 2 turns activity did not occur: Safety/medical concerns  Assist level: Contact Guard/Touching assist Assistive device: Walker-rolling    Walk 150 feet activity   Assist Walk 150 feet activity did not occur: Safety/medical concerns  Assist level: Contact Guard/Touching assist Assistive device: Walker-rolling    Walk 10 feet on uneven surface  activity   Assist Walk 10 feet on uneven surfaces  activity did not occur: Safety/medical concerns   Assist level: Minimal Assistance - Patient > 75% Assistive device: Walker-rolling   Wheelchair     Assist Is the patient using a wheelchair?: Yes Type of Wheelchair: Manual    Wheelchair assist level: Dependent - Patient 0%      Wheelchair 50 feet with 2 turns activity    Assist        Assist Level: Dependent - Patient 0%   Wheelchair 150 feet activity     Assist      Assist Level: Dependent - Patient 0%   Blood pressure (!) 123/51, pulse 71, temperature (!) 97.4 F (36.3 C), resp. rate 16, height 5\' 8"  (1.727 m), weight 103 kg, SpO2 96 %.  Medical Problem List and Plan: 1.  Functional deficits secondary to debility             -patient may shower but PEG must be covered.              -ELOS/Goals: 2-3 weeks S-MinA             -d/c date 04/04/21 Doing ice chips with SLP- changed d/c to 2/15 due to IV ABX and progress to increase level of function 2/13 ongoing difficulty with weakness and activity tolerance with therapies. 22/15- moved d/c date to next week- moved to 15/7 so gets therapy over weekend- con't CIR.  2.  Antithrombotics: -DVT/anticoagulation:  Mechanical: Sequential compression devices, below knee Bilateral lower extremities 1/31- although Hb is stable, pt still having black tarry stools- will consult Cards about Eliquis- I think it's concerning to restart right now, but will let Cards decide.  - Cards and GI agree NO ELIQUIS or Blood thinners.              -antiplatelet therapy: N/A 3. Pain Management: Tylenol 4. Mood:  LCSW to follow              -antipsychotic agents: N/A 5. Neuropsych: This patient is capable of making decisions on his own behalf. 6. Skin/Wound Care: pressure relief, protection to feet/heels 7. Fluids/Electrolytes/Nutrition: continue TF  -hyperkalemia: lokelma 5mg  daily x 2 doses, recheck labs Monday  2/1- see below under hyperkalemia  2/6- K+ 3.8- will monitor  2/11- K+  3.9- doing well  2/12- K+ 4.2--->>2/13 4.3 8. A flutter s/p TAVR 2020: plan is to hold eliquis for now and resume in the next few weeks potentially --HR controlled 1/29 1/30- hold eliquis- likely still bleeding- will call Cards in AM to see their plan.  9. Severe dysphagia w/aspiration: NPO with tube feeds for nutritonal support.  10. GIB: Monitor for signs of bleeding. Continue Protonix and Carafate. Holding Eliquis.  1/30- Hb 8.9- was 10.2- but previous was 8.9 so likely not bleeding severely, but will recheck labs in AM 1/31- Hb stable at 8.9- will monitor 2/1- GI maximized PPI to BID and maintained carafate- will also add FeSol solution BID for low iron levels 11. CKD 3a: BUN/SCr 124/2.07             --improved to 35/1.4 on 1/28  1/30- Cr 1.42 and BUN 34- overall stable- con't to monitor  -monitor output, labs Monday   1/31- Cr down slightly to 1.33 and BUN 32- con't to monitor  2/1- wil recheck in AM  2/2- Cr 1.26 and BUN down to 30- from 36- doing better- con't to monitor  2/3- Cr 1.34 and BUN 31- is chronic/baseline- will reduce dose of Keflex as a result.   2/6- BUN better at 29 and Cr stable at 1.34- con't to monitor  2/13 BUN trending up to 39 over last 10 days.    -will run low rate IVF tonight  2/14- will need more water flushes with BUN trending up- will ask nutrition to help out.   2/15- BUN down slightly to 37 and Cr 1.39- BUN a little less, but not great- will improve over time/likely was Septra that had over weekend. If doesn't improve by Monday, will give more IVFs.  12. Constipation: Continue Senna daily. 2/14- LBM 2 days ago 13. HLD: continue atorvastatin 40mg  daily.  14. Hypothyroidism: continue Synthroid.  15. Hyperkalemia- likely due to TfF'- has PEG.   1/31- given multiple doses of Lokelma- down to 4.8- but will recheck again  Thursday. 2/2- K+ 4.2- so doing much better on Nepro TF's  16. NPO-due to esophageal stricture/severe dysphagia on TF's  1/31- 1 reason  came into hospital with failure to thrive due to not eating/drinking- per chart review- SLP involved, but not sure if will be able to not use PEG.   2/2- failed MBS - cannot have anything by mouth  2/4: continue feeding via PEG  2/6- no issues with PEG- using abd binder  2/8- doesn't need abd binder when In bed- only when up 17. ABLA  2/1- will add Feosol solution BID to help with blood loss. 24: hgb 8/6 on 2/3, monitor weekly  18. Leukocytosis- UTI  2/2- Mild- WBC 11.1- will check CXR since is NPO and U/A and Cx to try and find source and recheck in AM  2/3- WBC up to 12.8- afebrile- however urine "so turbid couldn't run U/A"- has many bacteria- will recheck again in 3-4 days per d/w pharmacy- wil start Keflex 500 mg q12 hours- x 7 days- might need longer treatment, but will determine next week. Urine cloudy., odorous as well per nursing.    2/4: UC with >100,000 colonies citrobacter, f/u sensititivities  2/6- Change to Cefepime per d/w pharmacy- willl need 7 days- might need to extend him? Will check if can do anything PO?  2/7- pharm suggests Septra, but agrees that 88 yr old, not great choice- really needs IV ABX- will keep until 2/13  2/8- con't IV ABX until Sunday- recheck labs Monday  2/13 septra completed. Wbc's normal 8.7 19. Constipation  2/8- will check KUB and treat as required based on results. 2/9- as above.   20. Myoclonus due to Cefepime-  2/10 added to allergy list- called Neuro- will check ammonia and also start Keppra 250 mg BID to help- per Neuro- Sx's should improve in a few days off Cefepime.   2/11- Neurology said could take 3-4 days to get better- hard to test since occurs when gets up- asked nursing to get to chair today.  -2/12-13 some improvement. Pt with a lot of just generalized weakness.   2/14- will stop Keppra since myoclonus has resolved and monitor for resurgence. 2/15- no resurgence- will con't to monitor      LOS: 19 days A FACE TO [place] 03/29/2021, 7:58 AM

## 2021-03-29 NOTE — Progress Notes (Signed)
Occupational Therapy Session Note  Patient Details  Name: Gregory [name] MRN: [id number] Date of Birth: 12-Dec-1920  Today's Date: 03/29/2021 OT Individual Time: 1000-1045 OT Individual Time Calculation (min): 45 min    Short Term Goals: Week 3:  OT Short Term Goal 1 (Week 3): STG=LTG 2/2 ELOS (continued working to supervision goals)  Skilled Therapeutic Interventions/Progress Updates:    Pt missed 30 mins skilled OT services at beginning of session 2/2 LPN Ashely administering meds and tube feed via PEG tube. Bed mobility with min A. Sit<>stand with min A. Stand pivot transfers with min A. BLE therex seated in w/c. Ball kicks 3x20 BLE, marching 3x2 mins, and knee extensions 3x12 with rest breaks between sets. Pt retuned to room and remained in w/c with all needs within reach. Son present.   Therapy Documentation Precautions:  Precautions Precautions: Fall Precaution Comments: NPO with PEG tube, posterior lean Restrictions Weight Bearing Restrictions: No General: General OT Amount of Missed Time: 30 Minutes Vital Signs:  Pain: Pain Assessment Pain Scale: 0-10 Pain Score: 0-No pain ADL: ADL Eating: NPO Grooming: Moderate assistance (due to hand tremors and strength deficits) Where Assessed-Grooming: Chair Upper Body Bathing: Moderate assistance (for sitting balance) Where Assessed-Upper Body Bathing: Edge of bed Lower Body Bathing: Maximal assistance Where Assessed-Lower Body Bathing: Edge of bed Upper Body Dressing: Maximal assistance Where Assessed-Upper Body Dressing: Edge of bed Lower Body Dressing: Dependent Where Assessed-Lower Body Dressing: Edge of bed Toileting: Maximal assistance Where Assessed-Toileting: Glass blower/designer: Moderate assistance (Min A ambulatory transfer to the toilet using RW, Max A for sit<stand from standard toilet. Per daughter, pt has elevated toilet seat at home with toilet bars) Tub/Shower Transfer: Not assessed Vision   Perception     Praxis   Balance   Exercises:   Other Treatments:     Therapy/Group: Individual Therapy  Leroy Libman 03/29/2021, 10:48 AM

## 2021-03-29 NOTE — Progress Notes (Signed)
Speech Language Pathology Weekly Progress and Session Note  Patient Details  Name: Gregory [name] MRN: [id number] Date of Birth: 01/03/1921  Beginning of progress report period: March 22, 2021 End of progress report period: March 29, 2021  Today's Date: 03/29/2021 SLP Missed Time: 30 Minutes Missed Time Reason: Patient fatigue  Short Term Goals: Week 3: SLP Short Term Goal 1 (Week 3): STGs=LTGs due to ELOS SLP Short Term Goal 1 - Progress (Week 3): Not met    New Short Term Goals: Week 4: SLP Short Term Goal 1 (Week 4): STGs=LTGs due to ELOS  Weekly Progress Updates: Patient continues to make gains towards LTGs this reporting period. Currently, patient is at his cognitive baseline and has met all cognitive goals at an overall supervision level. Patient remains NPO but is participating in pharyngeal strengthening exercises, RMT and trials of ice chips/thin liquids. Patient's length of stay was extended due to ongoing medical issues, therefore, family education is ongoing. Patient would benefit from continued skilled SLP intervention to maximize his swallowing function and complete family education prior to discharge.     Intensity: Minumum of 1-2 x/day, 30 to 90 minutes Frequency: 3 to 5 out of 7 days Duration/Length of Stay: 04/04/21 Treatment/Interventions: Cueing hierarchy;Environmental controls;Patient/family education;Dysphagia/aspiration precaution training;Therapeutic Exercise   Daily Session Cancellation Note   Patient missed 30 minutes of session due to fatigue. Patient asleep in bed and declined therapy despite encouragement. Will re-attempt as able.       Glora Hulgan 03/29/2021, 6:39 AM

## 2021-03-29 NOTE — Plan of Care (Signed)
Problem: RH Problem Solving Goal: LTG Patient will demonstrate problem solving for (SLP) Description: LTG:  Patient will demonstrate problem solving for basic/complex daily situations with cues  (SLP) Outcome: Completed/Met   Problem: RH Memory Goal: LTG Patient will use memory compensatory aids to (SLP) Description: LTG:  Patient will use memory compensatory aids to recall biographical/new, daily complex information with cues (SLP) Outcome: Completed/Met   Problem: RH Awareness Goal: LTG: Patient will demonstrate awareness during functional activites type of (SLP) Description: LTG: Patient will demonstrate awareness during functional activites type of (SLP) Outcome: Completed/Met   Problem: RH Swallowing Goal: LTG Patient will consume least restrictive diet using compensatory strategies with assistance (SLP) Description: LTG:  Patient will consume least restrictive diet using compensatory strategies with assistance (SLP) Outcome: Not Applicable Note: Goal discharged due to severity of dysphagia Goal: LTG Pt will demonstrate functional change in swallow as evidenced by bedside/clinical objective assessment (SLP) Description: LTG: Patient will demonstrate functional change in swallow as evidenced by bedside/clinical objective assessment (SLP) Outcome: Not Applicable Note: Goal discharged due to severity of dysphagia

## 2021-03-29 NOTE — Progress Notes (Signed)
Physical Therapy Session Note  Patient Details  Name: Gregory [name] MRN: [id number] Date of Birth: 05-25-1920  Today's Date: 03/29/2021 PT Individual Time: 1120-1150; 1300-1415 PT Individual Time Calculation (min): 30 min and 75 min  Short Term Goals: Week 3:  PT Short Term Goal 1 (Week 3): =LTG due to ELOS  Skilled Therapeutic Interventions/Progress Updates:    Session 1: Pt received seated in w/c in room for makeup therapy session. No complaints of pain, pt agreeable to therapy session. Dependent transport via w/c to/from therapy gym for time and energy conservation. Sit to stand with mod A to RW. Stand pivot transfer to mat table with RW and min A. Pt agreeable to attempt short distance ambulation this AM. Ambulation 2 x 10 ft with RW and min A with shuffling gait pattern. Pt then reports urge to have a BM. Stand pivot transfer w/c to toilet with use of grab bar and mod A. Pt is max A for clothing management, dependent for pericare following continent void, see Flowsheet for details. Pt left seated in w/c in room with needs in reach, son present at end of therapy session.  Session 2: Pt received seated in w/c in room, agreeable to PT session. No complaints of pain. Pt's son with questions/complaints regarding oral care schedule, notified nursing supervisor and speech therapy. Dependent transport via w/c to therapy gym for time and energy conservation. Sit to stand with mod to max A to RW during session. Ambulation 2 x 30 ft with RW and CGA for balance, improved endurance for gait training this date with decreased assist needed. Standing alt L/R 4" step-taps with RW and CGA for balance, 3 x 10 reps to fatigue. Ambulation x 121 ft from therapy gym back to pt bed with RW and min A! Pt exhibits improved endurance for gait training this date. Pt requests to return to bed at end of session. Sit to supine mod A needed for BLE management. Pt left seated in bed with needs in reach, bed alarm in  place.  Therapy Documentation Precautions:  Precautions Precautions: Fall Precaution Comments: NPO with PEG tube, posterior lean Restrictions Weight Bearing Restrictions: No     Therapy/Group: Individual Therapy   Excell Seltzer, PT, DPT, CSRS  03/29/2021, 3:51 PM

## 2021-03-29 NOTE — Progress Notes (Signed)
Pt family expressed concern that pt is not receiving oral cares as often as they would like. Educated family on importance of family participation in cares while at bedside. Family giving pushback at the idea stating it is not their responsibility. Oral cares performed x3 on this shift. Sheela Stack, LPN

## 2021-03-30 DIAGNOSIS — Z789 Other specified health status: Secondary | ICD-10-CM

## 2021-03-30 DIAGNOSIS — R1310 Dysphagia, unspecified: Secondary | ICD-10-CM

## 2021-03-30 LAB — GLUCOSE, CAPILLARY
Glucose-Capillary: 78 mg/dL (ref 70–99)
Glucose-Capillary: 110 mg/dL — ABNORMAL HIGH (ref 70–99)
Glucose-Capillary: 102 mg/dL — ABNORMAL HIGH (ref 70–99)

## 2021-03-30 NOTE — Progress Notes (Signed)
Occupational Therapy Session Note  Patient Details  Name: Gregory [name] MRN: [id number] Date of Birth: [birth year]  Today's Date: 03/30/2021 OT Individual Time: 1191-4782 OT Individual Time Calculation (min): 70 min    Short Term Goals: Week 3:  OT Short Term Goal 1 (Week 3): STG=LTG 2/2 ELOS (continued working to supervision goals)  Skilled Therapeutic Interventions/Progress Updates:    Pt resting in bed upon arrival with son present. Supine>sit EOB with mod A. Sitting balance with supervision. Sit<>stand with mod A. Stand pivot transfer with CGA. Pt completed grooming tasks at sink. OTA assisted with oral care. Pt changed shirt, requiring mod A for donning pullover shirt. Pt transitioned to gym. BLE exercises with 1# ankle weights: seated marching 3x30 secs, kicking ball 3x10, sand standing marching 1x10. Pt reports his "legs are weak today. Pt engaged in BUE threrex with 2# bar: chest presses 3x10 and tapping ball 3x10. Pt returned to room and remained in w/c. Pt agreeable to taking shower tomorrow.   Therapy Documentation Precautions:  Precautions Precautions: Fall Precaution Comments: NPO with PEG tube, posterior lean Restrictions Weight Bearing Restrictions: No  Pain:  PT denies pain this morning   Therapy/Group: Individual Therapy  Leroy Libman 03/30/2021, 12:19 PM

## 2021-03-30 NOTE — Progress Notes (Signed)
PROGRESS NOTE   Subjective/Complaints:  Took some steps yesterday- "too many to count".  LBM yesterday per nursing- pt thought 2 days ago-  Pt denies constipation.   ROS:   Pt denies SOB, abd pain, CP, N/V/C/D, and vision changes    Objective:   No results found. No results for input(s): WBC, HGB, HCT, PLT in the last 72 hours.   Recent Labs    03/28/21 0635  NA 137  K 4.1  CL 98  CO2 31  GLUCOSE 93  BUN 37*  CREATININE 1.39*  CALCIUM 9.4     Intake/Output Summary (Last 24 hours) at 03/30/2021 0839 Last data filed at 03/30/2021 0700 Gross per 24 hour  Intake --  Output 2185 ml  Net -2185 ml     Pressure Injury 03/03/21 Heel Left Deep Tissue Pressure Injury - Purple or maroon localized area of discolored intact skin or blood-filled blister due to damage of underlying soft tissue from pressure and/or shear. (Active)  03/03/21 1114  Location: Heel  Location Orientation: Left  Staging: Deep Tissue Pressure Injury - Purple or maroon localized area of discolored intact skin or blood-filled blister due to damage of underlying soft tissue from pressure and/or shear.  Wound Description (Comments):   Present on Admission: Yes     Pressure Injury 03/10/21 Toe (Comment  which one) Anterior;Left;Distal Unstageable - Full thickness tissue loss in which the base of the injury is covered by slough (yellow, tan, gray, green or brown) and/or eschar (tan, brown or black) in the wound bed. (Active)  03/10/21 1730  Location: Toe (Comment  which one) (Left great toe at the tip.)  Location Orientation: Anterior;Left;Distal  Staging: Unstageable - Full thickness tissue loss in which the base of the injury is covered by slough (yellow, tan, gray, green or brown) and/or eschar (tan, brown or black) in the wound bed.  Wound Description (Comments): Black area at the tip of the left great toe.  Present on Admission: Yes     Pressure  Injury 03/10/21 Toe (Comment  which one) Anterior;Right;Distal Unstageable - Full thickness tissue loss in which the base of the injury is covered by slough (yellow, tan, gray, green or brown) and/or eschar (tan, brown or black) in the wound bed (Active)  03/10/21 1730  Location: Toe (Comment  which one) (Right great toe at the tip of the toe.)  Location Orientation: Anterior;Right;Distal  Staging: Unstageable - Full thickness tissue loss in which the base of the injury is covered by slough (yellow, tan, gray, green or brown) and/or eschar (tan, brown or black) in the wound bed.  Wound Description (Comments): Right great toe at the tip of the toe.  Present on Admission: Yes    Physical Exam: Vital Signs Blood pressure (!) 110/56, pulse 70, temperature 98 F (36.7 C), resp. rate 15, height 5\' 8"  (1.727 m), weight 103 kg, SpO2 95 %.      General: awake, alert, appropriate, laying supine in bed; just woke up;  NAD HENT: conjugate gaze; oropharynx moist CV: regular rate; no JVD Pulmonary: CTA B/L; no W/R/R- good air movement GI: soft, NT, ND, (+)BS Psychiatric: appropriate Neurological: alert- HOH Ext: no clubbing,  cyanosis, or edema Psych: pleasant and cooperative  Skin: foot/heel wounds stable,  covered with dressings-foam- C/D/I Neuro:  HOH but able comprehend when he's able to hear. No focal language issues. Mild dysarthria. Strength grossly 4/5 bilateral UE/LE's. No major change noted today  No focal sensory abnl. No abnl tone.  Musculoskeletal: Full ROM, No pain with AROM or PROM in the neck, trunk, or extremities. Posture appropriate     Assessment/Plan: 1. Functional deficits which require 3+ hours per day of interdisciplinary therapy in a comprehensive inpatient rehab setting. Physiatrist is providing close team supervision and 24 hour management of active medical problems listed below. Physiatrist and rehab team continue to assess barriers to discharge/monitor patient  progress toward functional and medical goals  Care Tool:  Bathing    Body parts bathed by patient: Right arm, Left arm, Chest, Buttocks, Front perineal area, Abdomen, Right upper leg, Left upper leg, Face   Body parts bathed by helper: Buttocks     Bathing assist Assist Level: Minimal Assistance - Patient > 75%     Upper Body Dressing/Undressing Upper body dressing   What is the patient wearing?: Pull over shirt    Upper body assist Assist Level: Minimal Assistance - Patient > 75%    Lower Body Dressing/Undressing Lower body dressing      What is the patient wearing?: Incontinence brief, Pants     Lower body assist Assist for lower body dressing: Moderate Assistance - Patient 50 - 74%     Toileting Toileting    Toileting assist Assist for toileting: Maximal Assistance - Patient 25 - 49%     Transfers Chair/bed transfer  Transfers assist     Chair/bed transfer assist level: Moderate Assistance - Patient 50 - 74%     Locomotion Ambulation   Ambulation assist   Ambulation activity did not occur: Safety/medical concerns  Assist level: Minimal Assistance - Patient > 75% Assistive device: Walker-rolling Max distance: 121'   Walk 10 feet activity   Assist  Walk 10 feet activity did not occur: Safety/medical concerns  Assist level: Minimal Assistance - Patient > 75% Assistive device: Walker-rolling   Walk 50 feet activity   Assist Walk 50 feet with 2 turns activity did not occur: Safety/medical concerns  Assist level: Minimal Assistance - Patient > 75% Assistive device: Walker-rolling    Walk 150 feet activity   Assist Walk 150 feet activity did not occur: Safety/medical concerns  Assist level: Contact Guard/Touching assist Assistive device: Walker-rolling    Walk 10 feet on uneven surface  activity   Assist Walk 10 feet on uneven surfaces activity did not occur: Safety/medical concerns   Assist level: Minimal Assistance - Patient >  75% Assistive device: Walker-rolling   Wheelchair     Assist Is the patient using a wheelchair?: Yes Type of Wheelchair: Manual    Wheelchair assist level: Dependent - Patient 0%      Wheelchair 50 feet with 2 turns activity    Assist        Assist Level: Dependent - Patient 0%   Wheelchair 150 feet activity     Assist      Assist Level: Dependent - Patient 0%   Blood pressure (!) 110/56, pulse 70, temperature 98 F (36.7 C), resp. rate 15, height 5\' 8"  (1.727 m), weight 103 kg, SpO2 95 %.  Medical Problem List and Plan: 1. Functional deficits secondary to debility             -patient may shower  but PEG must be covered.              -ELOS/Goals: 2-3 weeks S-MinA             -d/c date 04/04/21 Doing ice chips with SLP- changed d/c to 2/15 due to IV ABX and progress to increase level of function 2/13 ongoing difficulty with weakness and activity tolerance with therapies. 2/16- Con't CIR- PT, OT and SLP- walked 121 ft but sit stand is still mod to max assist- family might need education on how participation in his care is needed, so they are prepared when he goes home  2.  Antithrombotics: -DVT/anticoagulation:  Mechanical: Sequential compression devices, below knee Bilateral lower extremities 1/31- although Hb is stable, pt still having black tarry stools- will consult Cards about Eliquis- I think it's concerning to restart right now, but will let Cards decide.  - Cards and GI agree NO ELIQUIS or Blood thinners.              -antiplatelet therapy: N/A 3. Pain Management: Tylenol 4. Mood:  LCSW to follow              -antipsychotic agents: N/A 5. Neuropsych: This patient is capable of making decisions on his own behalf. 6. Skin/Wound Care: pressure relief, protection to feet/heels 7. Fluids/Electrolytes/Nutrition: continue TF  -hyperkalemia: lokelma 5mg  daily x 2 doses, recheck labs Monday  2/1- see below under hyperkalemia  2/6- K+ 3.8- will monitor  2/11-  K+ 3.9- doing well  2/12- K+ 4.2--->>2/13 4.3 8. A flutter s/p TAVR 2020: plan is to hold eliquis for now and resume in the next few weeks potentially --HR controlled 1/29 1/30- hold eliquis- likely still bleeding- will call Cards in AM to see their plan.  9. Severe dysphagia w/aspiration: NPO with tube feeds for nutritonal support.  10. GIB: Monitor for signs of bleeding. Continue Protonix and Carafate. Holding Eliquis.  1/30- Hb 8.9- was 10.2- but previous was 8.9 so likely not bleeding severely, but will recheck labs in AM 1/31- Hb stable at 8.9- will monitor 2/1- GI maximized PPI to BID and maintained carafate- will also add FeSol solution BID for low iron levels 11. CKD 3a: BUN/SCr 124/2.07             --improved to 35/1.4 on 1/28  1/30- Cr 1.42 and BUN 34- overall stable- con't to monitor  -monitor output, labs Monday   1/31- Cr down slightly to 1.33 and BUN 32- con't to monitor  2/1- wil recheck in AM  2/2- Cr 1.26 and BUN down to 30- from 36- doing better- con't to monitor  2/3- Cr 1.34 and BUN 31- is chronic/baseline- will reduce dose of Keflex as a result.   2/6- BUN better at 29 and Cr stable at 1.34- con't to monitor  2/13 BUN trending up to 39 over last 10 days.    -will run low rate IVF tonight  2/14- will need more water flushes with BUN trending up- will ask nutrition to help out.   2/15- BUN down slightly to 37 and Cr 1.39- BUN a little less, but not great- will improve over time/likely was Septra that had over weekend. If doesn't improve by Monday, will give more IVFs.  12. Constipation: Continue Senna daily. 2/16- LBM yesterday- going more regularly.  13. HLD: continue atorvastatin 40mg  daily.  14. Hypothyroidism: continue Synthroid.  15. Hyperkalemia- likely due to TfF'- has PEG.   1/31- given multiple doses of Lokelma- down to 4.8-  but will recheck again Thursday. 2/2- K+ 4.2- so doing much better on Nepro TF's 2/16- K+ 4.1- con't regimen  16. NPO-due to esophageal  stricture/severe dysphagia on TF's  1/31- 1 reason came into hospital with failure to thrive due to not eating/drinking- per chart review- SLP involved, but not sure if will be able to not use PEG.   2/2- failed MBS - cannot have anything by mouth  2/4: continue feeding via PEG  2/6- no issues with PEG- using abd binder  2/8- doesn't need abd binder when In bed- only when up 17. ABLA  2/1- will add Feosol solution BID to help with blood loss. 24: hgb 8/6 on 2/3, monitor weekly  18. Leukocytosis- UTI  2/2- Mild- WBC 11.1- will check CXR since is NPO and U/A and Cx to try and find source and recheck in AM  2/3- WBC up to 12.8- afebrile- however urine "so turbid couldn't run U/A"- has many bacteria- will recheck again in 3-4 days per d/w pharmacy- wil start Keflex 500 mg q12 hours- x 7 days- might need longer treatment, but will determine next week. Urine cloudy., odorous as well per nursing.    2/4: UC with >100,000 colonies citrobacter, f/u sensititivities  2/6- Change to Cefepime per d/w pharmacy- willl need 7 days- might need to extend him? Will check if can do anything PO?  2/7- pharm suggests Septra, but agrees that 88 yr old, not great choice- really needs IV ABX- will keep until 2/13  2/8- con't IV ABX until Sunday- recheck labs Monday  2/13 septra completed. Wbc's normal 8.7   19. Myoclonus due to Cefepime-  2/10 added to allergy list- called Neuro- will check ammonia and also start Keppra 250 mg BID to help- per Neuro- Sx's should improve in a few days off Cefepime.   2/11- Neurology said could take 3-4 days to get better- hard to test since occurs when gets up- asked nursing to get to chair today.  -2/12-13 some improvement. Pt with a lot of just generalized weakness.   2/14- will stop Keppra since myoclonus has resolved and monitor for resurgence. 2/15- no resurgence- will con't to monitor  20, Dispo  2/16- pt walking better- is progressing, but still a lot of help with transfers-      LOS: 20 days A FACE TO FACE EVALUATION WAS PERFORMED  Gregory [name] 03/30/2021, 8:39 AM

## 2021-03-30 NOTE — Progress Notes (Signed)
Patient's daughter states "Dad is getting a cold and we need to jump on this". Writer stated she would write note about her concerns so provider can see in the morning. Daughter states patient has a cough and is cold and that is why he is getting a cold. Temperature 98 and WBC 8.7 on 2/13.

## 2021-03-30 NOTE — Progress Notes (Signed)
Physical Therapy Session Note  Patient Details  Name: Gregory [name] MRN: [id number] Date of Birth: 01-06-1921  Today's Date: 03/30/2021 PT Individual Time: 2060-1561 PT Individual Time Calculation (min): 15 min   Short Term Goals: Week 3:  PT Short Term Goal 1 (Week 3): =LTG due to ELOS  Skilled Therapeutic Interventions/Progress Updates:     0800: Patient asleep in bed upon PT arrival. Patient aroused to verbal stimulation. Patient reported "it is too early for this." Declined all skilled interventions at this time. Agreeable to PT returning at a later time.   Patient in bed asleep with a visitor in the room upon PT arrival. Patient slow to arouse and continued to request for increased time before therapy to rest. Discussed changing schedule to start therapies at 0930 and begin with OT, patient in agreement. Schedule adjusted and scheduling and team made aware. Performed scooting up in bed with max A with cues for use of lower extremities to push up.   Patient in bed with visitor in the room at end of session with breaks locked, bed alarm set, and all needs within reach. Patient missed 30 min of skilled PT due to fatigue/pt refusal, RN made aware. Will attempt to make-up missed time as able.    Therapy Documentation Precautions:  Precautions Precautions: Fall Precaution Comments: NPO with PEG tube, posterior lean Restrictions Weight Bearing Restrictions: No General: PT Amount of Missed Time (min): 30 Minutes PT Missed Treatment Reason: Patient unwilling to participate;Patient fatigue   Therapy/Group: Individual Therapy  Forney Kleinpeter L Kayana Thoen PT, DPT  03/30/2021, 12:14 PM

## 2021-03-30 NOTE — Progress Notes (Signed)
Pt family upset that nursing stated that family is allowed to assist with things like repositioning, toileting needs, and tranfers. Pt family addressed this nurse to tell what prior nurse said to her. This nurse then educated pt family by stating " In rehab, we prepare our patients to go home, we encourage family to be involved with things like personal care because the more you can learn and do while you are here with your loved one, the more successful you will be with taking care of him at home. Pt family seemed irritated with what nurse said and responded with " Well, I guess I should feed him and give him his medicine too? I mean if you guys want me to take care of him, we really don't need you, we are just here for physical therapy. We pay for you to care for him." This nurse continue to encourage family involvement with patient. Family stated they would be back tomorrow.

## 2021-03-30 NOTE — Progress Notes (Signed)
Palliative Care Progress Note, Assessment & Plan   Patient Name: Gregory [name]       Date: 03/30/2021 DOB: November 08, 1920  Age: 88 y.o. MRN#: [id number] Attending Physician: Courtney Heys, MD Primary Care Physician: Orpah Greek, DO Admit Date: 03/10/2021  Reason for Consultation/Follow-up: Establishing goals of care  Subjective: As I entered the room, patient acknowledged my presence.  He is able to make his wishes known.  RN was giving bolus tube feed.  Patient has no acute complaints at this time.  When asked how he was feeling he said "well I do not really know today".  Patient son Gregory [name] is at bedside.  HPI: Patient is a 88 year old male with a past medical history significant for a flutter status post TAVR with ablation and PPM, HLD, hypothyroidism, hypertension, neuropathy, lung nodule, dysphagia with PEG in place, and HOH admitted on 1/27 with hypotension, dizziness and a near syncopal event with a hemoglobin of 5.9.  Patient had significant functional decline and was admitted to CIR for rehab.   Plan of Care: I have reviewed medical records including EPIC notes, labs and imaging, assessed the patient and then met with patient and his son Gregory [name] at bedside.  Gregory [name] shares his concerns that the patient has been declining AM therapies.  Reviewed that patient has declined morning therapies and participated in late morning and afternoon therapies.  Discussed with patient reasons behind declining morning therapies (8am/9am).  Patient shared he does not like to get up early in the morning, and that anytime after 9 AM would be best for him.  Patient did not recall declining any therapies and continued to repeat that he never said no to anyone.  Patient's son Gregory [name] voiced concerns regarding SL therapy that  patient declined yesterday.  Therapeutic silence and active listening provided for Gregory [name] to share his thoughts and emotions regarding this issue.  I shared that I would voice his concerns with the assistant director and hopefully get his concerns addressed.  Discussed spiritual and emotional connection of the mind with the body and importance of a holistic approach from the medical team. Discussed spiritual care chaplain Dorian Pod as part of this holistic approach. Gregory [name] was receptive though shared his church has been active and supportive during this time. He also shared this has been a hard time for him and it has been a marathon, not a sprint.   Discussed with patient/family the importance of continued conversation with family and the medical providers regarding overall plan of care and treatment options, ensuring decisions are within the context of the patients values and GOCs.    Questions and concerns were addressed. The family was encouraged to call with questions or concerns.   Care plan was discussed with patient, patient's son Gregory [name], AD of unit  Code Status: Full code  Prognosis:  Unable to determine  Discharge Planning: Home with Palliative Services  Recommendations/Plan: Negotiate with therapists and patient to have therapist at times patient is compliant  Continue to treat the treatable Full code remains Spiritual care consult placed  Physical Exam Vitals and nursing note reviewed.  Constitutional:      Appearance: Normal appearance.  HENT:     Mouth/Throat:  Mouth: Mucous membranes are moist.     Pharynx: Oropharynx is clear.  Eyes:     Pupils: Pupils are equal, round, and reactive to light.  Cardiovascular:     Rate and Rhythm: Normal rate.     Pulses: Normal pulses.  Pulmonary:     Effort: Pulmonary effort is normal.  Abdominal:     Palpations: Abdomen is soft.  Musculoskeletal:     Comments: Generalized weakness  Skin:    General: Skin is warm and dry.   Neurological:     Mental Status: He is alert. Mental status is at baseline.  Psychiatric:        Mood and Affect: Mood normal.        Behavior: Behavior normal.                Palliative Assessment/Data: 50%       Total Time 30 minutes  Greater than 50%  of this time was spent counseling and coordinating care related to the above assessment and plan.  Thank you for allowing the Palliative Medicine Team to assist in the care of this patient.  [place] Ilsa Iha, FNP-BC Palliative Medicine Team Team Phone # [phone]

## 2021-03-30 NOTE — Progress Notes (Signed)
[facility]) Hospital Liaison note:  This is a pending outpatient-based Palliative Care patient. Will continue to follow for disposition.  Please call with any outpatient palliative questions or concerns.  Thank you, Lorelee Market, LPN [facility] Liaison [phone]

## 2021-03-30 NOTE — Progress Notes (Addendum)
This chaplain responded to PMT referral for Pt. and family spiritual care. The Pt. is awake and accepted the chaplain's invitation to sit beside him. The Pt. daughter-Jackie joined the Pt. and chaplain in the story telling.  The chaplain understands the Pt. has many memories of his wife and their 67 1/2 years of marriage. The chaplain noticed the Pt. smiling as he talked about her and his family.  The Pt. accepted the chaplain invitation for F/U spiritual care.  Chaplain Sallyanne Kuster [phone]

## 2021-03-31 ENCOUNTER — Inpatient Hospital Stay (HOSPITAL_COMMUNITY): Payer: Medicare Other

## 2021-03-31 ENCOUNTER — Inpatient Hospital Stay (HOSPITAL_COMMUNITY)
Admission: AD | Admit: 2021-03-31 | Discharge: 2021-04-05 | DRG: 177 | Disposition: A | Discharge status: Rehabilitation Facility | Payer: Medicare Other | Source: Ambulatory Visit | Attending: Internal Medicine | Admitting: Internal Medicine

## 2021-03-31 DIAGNOSIS — Z6823 Body mass index (BMI) 23.0-23.9, adult: Secondary | ICD-10-CM | POA: Diagnosis not present

## 2021-03-31 DIAGNOSIS — R1312 Dysphagia, oropharyngeal phase: Secondary | ICD-10-CM | POA: Diagnosis not present

## 2021-03-31 DIAGNOSIS — R0902 Hypoxemia: Secondary | ICD-10-CM | POA: Diagnosis present

## 2021-03-31 DIAGNOSIS — I129 Hypertensive chronic kidney disease with stage 1 through stage 4 chronic kidney disease, or unspecified chronic kidney disease: Secondary | ICD-10-CM | POA: Diagnosis present

## 2021-03-31 DIAGNOSIS — Z8249 Family history of ischemic heart disease and other diseases of the circulatory system: Secondary | ICD-10-CM

## 2021-03-31 DIAGNOSIS — I482 Chronic atrial fibrillation, unspecified: Secondary | ICD-10-CM | POA: Diagnosis present

## 2021-03-31 DIAGNOSIS — N1831 Chronic kidney disease, stage 3a: Secondary | ICD-10-CM | POA: Diagnosis present

## 2021-03-31 DIAGNOSIS — D649 Anemia, unspecified: Secondary | ICD-10-CM | POA: Diagnosis not present

## 2021-03-31 DIAGNOSIS — E785 Hyperlipidemia, unspecified: Secondary | ICD-10-CM | POA: Diagnosis present

## 2021-03-31 DIAGNOSIS — Z515 Encounter for palliative care: Secondary | ICD-10-CM | POA: Diagnosis not present

## 2021-03-31 DIAGNOSIS — F05 Delirium due to known physiological condition: Secondary | ICD-10-CM | POA: Diagnosis not present

## 2021-03-31 DIAGNOSIS — Z931 Gastrostomy status: Secondary | ICD-10-CM | POA: Diagnosis not present

## 2021-03-31 DIAGNOSIS — R051 Acute cough: Secondary | ICD-10-CM | POA: Diagnosis not present

## 2021-03-31 DIAGNOSIS — N183 Chronic kidney disease, stage 3 unspecified: Secondary | ICD-10-CM | POA: Diagnosis present

## 2021-03-31 DIAGNOSIS — Z85828 Personal history of other malignant neoplasm of skin: Secondary | ICD-10-CM | POA: Diagnosis not present

## 2021-03-31 DIAGNOSIS — Z888 Allergy status to other drugs, medicaments and biological substances status: Secondary | ICD-10-CM

## 2021-03-31 DIAGNOSIS — U071 COVID-19: Secondary | ICD-10-CM | POA: Diagnosis present

## 2021-03-31 DIAGNOSIS — Z950 Presence of cardiac pacemaker: Secondary | ICD-10-CM | POA: Diagnosis not present

## 2021-03-31 DIAGNOSIS — Z79890 Hormone replacement therapy (postmenopausal): Secondary | ICD-10-CM

## 2021-03-31 DIAGNOSIS — Z8616 Personal history of COVID-19: Secondary | ICD-10-CM

## 2021-03-31 DIAGNOSIS — D631 Anemia in chronic kidney disease: Secondary | ICD-10-CM | POA: Diagnosis present

## 2021-03-31 DIAGNOSIS — R5381 Other malaise: Secondary | ICD-10-CM | POA: Diagnosis present

## 2021-03-31 DIAGNOSIS — E46 Unspecified protein-calorie malnutrition: Secondary | ICD-10-CM | POA: Diagnosis present

## 2021-03-31 DIAGNOSIS — R251 Tremor, unspecified: Secondary | ICD-10-CM | POA: Diagnosis present

## 2021-03-31 DIAGNOSIS — I442 Atrioventricular block, complete: Secondary | ICD-10-CM | POA: Diagnosis present

## 2021-03-31 DIAGNOSIS — R1313 Dysphagia, pharyngeal phase: Secondary | ICD-10-CM | POA: Diagnosis present

## 2021-03-31 DIAGNOSIS — N390 Urinary tract infection, site not specified: Secondary | ICD-10-CM

## 2021-03-31 DIAGNOSIS — I4891 Unspecified atrial fibrillation: Secondary | ICD-10-CM | POA: Diagnosis not present

## 2021-03-31 DIAGNOSIS — Z881 Allergy status to other antibiotic agents status: Secondary | ICD-10-CM | POA: Diagnosis not present

## 2021-03-31 DIAGNOSIS — Z79899 Other long term (current) drug therapy: Secondary | ICD-10-CM | POA: Diagnosis not present

## 2021-03-31 DIAGNOSIS — K5900 Constipation, unspecified: Secondary | ICD-10-CM | POA: Diagnosis present

## 2021-03-31 DIAGNOSIS — R059 Cough, unspecified: Secondary | ICD-10-CM

## 2021-03-31 DIAGNOSIS — R1310 Dysphagia, unspecified: Secondary | ICD-10-CM

## 2021-03-31 DIAGNOSIS — K922 Gastrointestinal hemorrhage, unspecified: Secondary | ICD-10-CM | POA: Diagnosis present

## 2021-03-31 DIAGNOSIS — E43 Unspecified severe protein-calorie malnutrition: Secondary | ICD-10-CM | POA: Diagnosis present

## 2021-03-31 DIAGNOSIS — I2510 Atherosclerotic heart disease of native coronary artery without angina pectoris: Secondary | ICD-10-CM | POA: Diagnosis present

## 2021-03-31 DIAGNOSIS — L89616 Pressure-induced deep tissue damage of right heel: Secondary | ICD-10-CM | POA: Diagnosis present

## 2021-03-31 DIAGNOSIS — Z880 Allergy status to penicillin: Secondary | ICD-10-CM

## 2021-03-31 DIAGNOSIS — R509 Fever, unspecified: Secondary | ICD-10-CM | POA: Diagnosis present

## 2021-03-31 DIAGNOSIS — Z952 Presence of prosthetic heart valve: Secondary | ICD-10-CM | POA: Diagnosis not present

## 2021-03-31 DIAGNOSIS — E039 Hypothyroidism, unspecified: Secondary | ICD-10-CM | POA: Diagnosis present

## 2021-03-31 DIAGNOSIS — H9190 Unspecified hearing loss, unspecified ear: Secondary | ICD-10-CM | POA: Diagnosis present

## 2021-03-31 DIAGNOSIS — Z806 Family history of leukemia: Secondary | ICD-10-CM | POA: Diagnosis not present

## 2021-03-31 DIAGNOSIS — I4892 Unspecified atrial flutter: Secondary | ICD-10-CM | POA: Diagnosis present

## 2021-03-31 DIAGNOSIS — G629 Polyneuropathy, unspecified: Secondary | ICD-10-CM | POA: Diagnosis present

## 2021-03-31 DIAGNOSIS — G4700 Insomnia, unspecified: Secondary | ICD-10-CM | POA: Diagnosis present

## 2021-03-31 DIAGNOSIS — E876 Hypokalemia: Secondary | ICD-10-CM | POA: Diagnosis present

## 2021-03-31 DIAGNOSIS — R410 Disorientation, unspecified: Secondary | ICD-10-CM | POA: Diagnosis present

## 2021-03-31 DIAGNOSIS — R040 Epistaxis: Secondary | ICD-10-CM | POA: Diagnosis not present

## 2021-03-31 DIAGNOSIS — R627 Adult failure to thrive: Secondary | ICD-10-CM | POA: Diagnosis present

## 2021-03-31 DIAGNOSIS — Z789 Other specified health status: Secondary | ICD-10-CM | POA: Diagnosis not present

## 2021-03-31 LAB — OCCULT BLOOD X 1 CARD TO LAB, STOOL: Fecal Occult Bld: NEGATIVE

## 2021-03-31 LAB — CBC WITH DIFFERENTIAL/PLATELET
Abs Immature Granulocytes: 0.04 10*3/uL (ref 0.00–0.07)
Abs Immature Granulocytes: 0.04 10*3/uL (ref 0.00–0.07)
Basophils Absolute: 0 10*3/uL (ref 0.0–0.1)
Basophils Absolute: 0.1 10*3/uL (ref 0.0–0.1)
Basophils Relative: 1 %
Basophils Relative: 1 %
Eosinophils Absolute: 0.1 10*3/uL (ref 0.0–0.5)
Eosinophils Absolute: 0.1 10*3/uL (ref 0.0–0.5)
Eosinophils Relative: 1 %
Eosinophils Relative: 1 %
HCT: 23.4 % — ABNORMAL LOW (ref 39.0–52.0)
HCT: 22.4 % — ABNORMAL LOW (ref 39.0–52.0)
Hemoglobin: 7.7 g/dL — ABNORMAL LOW (ref 13.0–17.0)
Hemoglobin: 7.4 g/dL — ABNORMAL LOW (ref 13.0–17.0)
Immature Granulocytes: 1 %
Immature Granulocytes: 1 %
Lymphocytes Relative: 8 %
Lymphocytes Relative: 13 %
Lymphs Abs: 0.7 10*3/uL (ref 0.7–4.0)
Lymphs Abs: 1.1 10*3/uL (ref 0.7–4.0)
MCH: 31.6 pg (ref 26.0–34.0)
MCH: 31.9 pg (ref 26.0–34.0)
MCHC: 32.9 g/dL (ref 30.0–36.0)
MCHC: 33 g/dL (ref 30.0–36.0)
MCV: 95.9 fL (ref 80.0–100.0)
MCV: 96.6 fL (ref 80.0–100.0)
Monocytes Absolute: 1 10*3/uL (ref 0.1–1.0)
Monocytes Absolute: 1 10*3/uL (ref 0.1–1.0)
Monocytes Relative: 12 %
Monocytes Relative: 13 %
Neutro Abs: 6.7 10*3/uL (ref 1.7–7.7)
Neutro Abs: 5.9 10*3/uL (ref 1.7–7.7)
Neutrophils Relative %: 77 %
Neutrophils Relative %: 71 %
Platelets: 137 10*3/uL — ABNORMAL LOW (ref 150–400)
Platelets: 138 10*3/uL — ABNORMAL LOW (ref 150–400)
RBC: 2.44 MIL/uL — ABNORMAL LOW (ref 4.22–5.81)
RBC: 2.32 MIL/uL — ABNORMAL LOW (ref 4.22–5.81)
RDW: 17.1 % — ABNORMAL HIGH (ref 11.5–15.5)
RDW: 17.3 % — ABNORMAL HIGH (ref 11.5–15.5)
WBC: 8.5 10*3/uL (ref 4.0–10.5)
WBC: 8.2 10*3/uL (ref 4.0–10.5)
nRBC: 0 % (ref 0.0–0.2)
nRBC: 0 % (ref 0.0–0.2)

## 2021-03-31 LAB — COMPREHENSIVE METABOLIC PANEL
ALT: 24 U/L (ref 0–44)
AST: 31 U/L (ref 15–41)
Albumin: 2.8 g/dL — ABNORMAL LOW (ref 3.5–5.0)
Alkaline Phosphatase: 54 U/L (ref 38–126)
Anion gap: 9 (ref 5–15)
BUN: 62 mg/dL — ABNORMAL HIGH (ref 8–23)
CO2: 26 mmol/L (ref 22–32)
Calcium: 8.4 mg/dL — ABNORMAL LOW (ref 8.9–10.3)
Chloride: 93 mmol/L — ABNORMAL LOW (ref 98–111)
Creatinine, Ser: 1.62 mg/dL — ABNORMAL HIGH (ref 0.61–1.24)
GFR, Estimated: 38 mL/min — ABNORMAL LOW (ref >60)
Glucose, Bld: 103 mg/dL — ABNORMAL HIGH (ref 70–99)
Potassium: 3.7 mmol/L (ref 3.5–5.1)
Sodium: 128 mmol/L — ABNORMAL LOW (ref 135–145)
Total Bilirubin: 0.4 mg/dL (ref 0.3–1.2)
Total Protein: 5.7 g/dL — ABNORMAL LOW (ref 6.5–8.1)

## 2021-03-31 LAB — CBC
HCT: 23.3 % — ABNORMAL LOW (ref 39.0–52.0)
Hemoglobin: 7.5 g/dL — ABNORMAL LOW (ref 13.0–17.0)
MCH: 31.3 pg (ref 26.0–34.0)
MCHC: 32.2 g/dL (ref 30.0–36.0)
MCV: 97.1 fL (ref 80.0–100.0)
Platelets: 132 10*3/uL — ABNORMAL LOW (ref 150–400)
RBC: 2.4 MIL/uL — ABNORMAL LOW (ref 4.22–5.81)
RDW: 17.5 % — ABNORMAL HIGH (ref 11.5–15.5)
WBC: 7.6 10*3/uL (ref 4.0–10.5)
nRBC: 0 % (ref 0.0–0.2)

## 2021-03-31 LAB — GLUCOSE, CAPILLARY
Glucose-Capillary: 104 mg/dL — ABNORMAL HIGH (ref 70–99)
Glucose-Capillary: 104 mg/dL — ABNORMAL HIGH (ref 70–99)
Glucose-Capillary: 117 mg/dL — ABNORMAL HIGH (ref 70–99)
Glucose-Capillary: 124 mg/dL — ABNORMAL HIGH (ref 70–99)
Glucose-Capillary: 98 mg/dL (ref 70–99)

## 2021-03-31 LAB — LACTATE DEHYDROGENASE: LDH: 179 U/L (ref 98–192)

## 2021-03-31 LAB — RESP PANEL BY RT-PCR (FLU A&B, COVID) ARPGX2
Influenza A by PCR: NEGATIVE
Influenza B by PCR: NEGATIVE
SARS Coronavirus 2 by RT PCR: POSITIVE — AB

## 2021-03-31 LAB — FIBRINOGEN: Fibrinogen: 450 mg/dL (ref 210–475)

## 2021-03-31 LAB — C-REACTIVE PROTEIN: CRP: 3.1 mg/dL — ABNORMAL HIGH (ref <1.0)

## 2021-03-31 LAB — PROCALCITONIN: Procalcitonin: <0.1 ng/mL

## 2021-03-31 LAB — LACTIC ACID, PLASMA: Lactic Acid, Venous: 0.7 mmol/L (ref 0.5–1.9)

## 2021-03-31 LAB — D-DIMER, QUANTITATIVE: D-Dimer, Quant: 0.79 ug/mL-FEU — ABNORMAL HIGH (ref 0.00–0.50)

## 2021-03-31 LAB — FERRITIN: Ferritin: 802 ng/mL — ABNORMAL HIGH (ref 24–336)

## 2021-03-31 MED ORDER — TRAZODONE HCL 50 MG PO TABS
25.0000 mg | ORAL_TABLET | Freq: Every evening | ORAL | Status: DC | PRN
  Administered 2021-03-31 – 2021-04-03 (×2): 50 mg
  Filled 2021-03-31 (×2): qty 1

## 2021-03-31 MED ORDER — LACTATED RINGERS IV SOLN: INTRAVENOUS | Status: DC

## 2021-03-31 MED ORDER — SODIUM CHLORIDE 0.9 % IV SOLN: INTRAVENOUS | Status: DC

## 2021-03-31 MED ORDER — ORAL CARE MOUTH RINSE
15.0000 mL | Freq: Four times a day (QID) | OROMUCOSAL | Status: DC
  Administered 2021-03-31 – 2021-04-04 (×14): 15 mL via OROMUCOSAL

## 2021-03-31 MED ORDER — K PHOS MONO-SOD PHOS DI & MONO 155-852-130 MG PO TABS
250.0000 mg | ORAL_TABLET | Freq: Every day | ORAL | Status: DC
  Administered 2021-04-01 – 2021-04-05 (×5): 250 mg
  Filled 2021-03-31 (×5): qty 1

## 2021-03-31 MED ORDER — BISACODYL 10 MG RE SUPP: 10.0000 mg | Freq: Every day | RECTAL | Status: DC | PRN

## 2021-03-31 MED ORDER — LEVOTHYROXINE SODIUM 75 MCG PO TABS
75.0000 ug | ORAL_TABLET | Freq: Every day | ORAL | Status: DC
  Administered 2021-04-01 – 2021-04-05 (×5): 75 ug
  Filled 2021-03-31 (×5): qty 1

## 2021-03-31 MED ORDER — SODIUM CHLORIDE 0.9 % IV SOLN
100.0000 mg | Freq: Every day | INTRAVENOUS | Status: DC
  Administered 2021-04-01: 100 mg via INTRAVENOUS
  Filled 2021-03-31: qty 20

## 2021-03-31 MED ORDER — SUCRALFATE 1 GM/10ML PO SUSP
1.0000 g | Freq: Three times a day (TID) | ORAL | Status: DC
  Administered 2021-03-31 – 2021-04-05 (×14): 1 g
  Filled 2021-03-31 (×14): qty 10

## 2021-03-31 MED ORDER — NEPRO/CARBSTEADY PO LIQD
315.0000 mL | Freq: Three times a day (TID) | ORAL | Status: DC
  Administered 2021-03-31 – 2021-04-01 (×2): 315 mL

## 2021-03-31 MED ORDER — FERROUS SULFATE 220 (44 FE) MG/5ML PO ELIX
220.0000 mg | ORAL_SOLUTION | Freq: Two times a day (BID) | ORAL | Status: DC
Start: 2021-04-01 — End: 2021-04-05
  Administered 2021-04-01 – 2021-04-05 (×8): 220 mg
  Filled 2021-03-31 (×10): qty 5

## 2021-03-31 MED ORDER — DIPHENHYDRAMINE HCL 12.5 MG/5ML PO ELIX
12.5000 mg | ORAL_SOLUTION | Freq: Four times a day (QID) | ORAL | Status: DC | PRN
  Filled 2021-03-31: qty 10

## 2021-03-31 MED ORDER — CHLORHEXIDINE GLUCONATE 0.12 % MT SOLN
15.0000 mL | Freq: Two times a day (BID) | OROMUCOSAL | Status: DC
  Administered 2021-03-31 – 2021-04-04 (×8): 15 mL via OROMUCOSAL
  Filled 2021-03-31 (×9): qty 15

## 2021-03-31 MED ORDER — FREE WATER
300.0000 mL | Freq: Four times a day (QID) | Status: DC
  Administered 2021-03-31 – 2021-04-01 (×3): 300 mL

## 2021-03-31 MED ORDER — ALBUTEROL SULFATE (2.5 MG/3ML) 0.083% IN NEBU: 2.5000 mg | INHALATION_SOLUTION | RESPIRATORY_TRACT | Status: DC | PRN

## 2021-03-31 MED ORDER — PANTOPRAZOLE 2 MG/ML SUSPENSION
40.0000 mg | Freq: Two times a day (BID) | ORAL | Status: DC
  Administered 2021-04-01 – 2021-04-05 (×9): 40 mg
  Filled 2021-03-31 (×9): qty 20

## 2021-03-31 MED ORDER — DEXAMETHASONE SODIUM PHOSPHATE 10 MG/ML IJ SOLN
6.0000 mg | INTRAMUSCULAR | Status: DC
  Administered 2021-03-31 – 2021-04-02 (×3): 6 mg via INTRAVENOUS
  Filled 2021-03-31 (×3): qty 1

## 2021-03-31 MED ORDER — LACTATED RINGERS IV SOLN: INTRAVENOUS | Status: AC

## 2021-03-31 MED ORDER — FUROSEMIDE 40 MG PO TABS
40.0000 mg | ORAL_TABLET | Freq: Once | ORAL | Status: AC
  Administered 2021-03-31: 40 mg via ORAL
  Filled 2021-03-31: qty 1

## 2021-03-31 MED ORDER — LIP MEDEX EX OINT
TOPICAL_OINTMENT | CUTANEOUS | Status: DC | PRN
  Filled 2021-03-31: qty 7

## 2021-03-31 MED ORDER — POLYETHYLENE GLYCOL 3350 17 G PO PACK: 17.0000 g | PACK | Freq: Every day | ORAL | Status: DC | PRN

## 2021-03-31 MED ORDER — SODIUM CHLORIDE 0.9 % IV BOLUS
250.0000 mL | Freq: Once | INTRAVENOUS | Status: AC
  Administered 2021-03-31: 250 mL via INTRAVENOUS

## 2021-03-31 MED ORDER — SODIUM CHLORIDE 0.9 % IV SOLN: 100.0000 mg | Freq: Every day | INTRAVENOUS | Status: DC

## 2021-03-31 MED ORDER — SENNOSIDES-DOCUSATE SODIUM 8.6-50 MG PO TABS
2.0000 | ORAL_TABLET | Freq: Every day | ORAL | Status: DC
  Administered 2021-03-31 – 2021-04-03 (×4): 2
  Filled 2021-03-31 (×5): qty 2

## 2021-03-31 MED ORDER — ALUM & MAG HYDROXIDE-SIMETH 200-200-20 MG/5ML PO SUSP: 30.0000 mL | ORAL | Status: DC | PRN

## 2021-03-31 MED ORDER — PROCHLORPERAZINE 25 MG RE SUPP
12.5000 mg | Freq: Four times a day (QID) | RECTAL | Status: DC | PRN
  Filled 2021-03-31: qty 1

## 2021-03-31 MED ORDER — ACETAMINOPHEN 325 MG PO TABS
325.0000 mg | ORAL_TABLET | ORAL | Status: DC | PRN
  Administered 2021-03-31: 650 mg
  Administered 2021-04-03: 325 mg
  Administered 2021-04-04: 650 mg
  Filled 2021-03-31 (×3): qty 2

## 2021-03-31 MED ORDER — SODIUM CHLORIDE 0.9 % IV SOLN: 200.0000 mg | Freq: Once | INTRAVENOUS | Status: DC

## 2021-03-31 MED ORDER — SODIUM CHLORIDE 0.9 % IV SOLN
100.0000 mg | Freq: Every day | INTRAVENOUS | Status: DC
  Filled 2021-03-31: qty 20

## 2021-03-31 MED ORDER — ATORVASTATIN CALCIUM 40 MG PO TABS
40.0000 mg | ORAL_TABLET | Freq: Every day | ORAL | Status: DC
  Administered 2021-03-31 – 2021-04-04 (×5): 40 mg
  Filled 2021-03-31 (×5): qty 1

## 2021-03-31 MED ORDER — ACETAMINOPHEN 325 MG PO TABS
650.0000 mg | ORAL_TABLET | Freq: Once | ORAL | Status: AC
  Administered 2021-03-31: 650 mg
  Filled 2021-03-31: qty 2

## 2021-03-31 MED ORDER — PROCHLORPERAZINE EDISYLATE 10 MG/2ML IJ SOLN: 5.0000 mg | Freq: Four times a day (QID) | INTRAMUSCULAR | Status: DC | PRN

## 2021-03-31 MED ORDER — GUAIFENESIN-DM 100-10 MG/5ML PO SYRP
5.0000 mL | ORAL_SOLUTION | Freq: Four times a day (QID) | ORAL | Status: DC | PRN
  Administered 2021-03-31: 10 mL
  Administered 2021-04-01: 5 mL
  Filled 2021-03-31 (×2): qty 10

## 2021-03-31 MED ORDER — PROCHLORPERAZINE MALEATE 5 MG PO TABS
5.0000 mg | ORAL_TABLET | Freq: Four times a day (QID) | ORAL | Status: DC | PRN
  Filled 2021-03-31: qty 2

## 2021-03-31 MED ORDER — SODIUM CHLORIDE 0.9 % IV SOLN
200.0000 mg | Freq: Once | INTRAVENOUS | Status: AC
  Administered 2021-03-31: 200 mg via INTRAVENOUS
  Filled 2021-03-31: qty 40

## 2021-03-31 MED ORDER — DEXAMETHASONE SODIUM PHOSPHATE 10 MG/ML IJ SOLN
6.0000 mg | INTRAMUSCULAR | Status: DC
  Filled 2021-03-31: qty 0.6

## 2021-03-31 NOTE — Progress Notes (Signed)
No assessment was done, pt came in later and refused to answer at this time.

## 2021-03-31 NOTE — Assessment & Plan Note (Addendum)
88 year old with fever, increased lethargy, cough and weakness found to have covid-19 Admit to progressive Has been vaccinated and boosted x 3 with covid infection in 07/2020  Cycle threshold: 22.7 Droplet and airborne precautions Family would like everything done remdesivir Decadron with oxygen at 90% room air Albuterol prn robitussin DM per PEG IS/flutter to bedside Repeat CXR tomorrow

## 2021-03-31 NOTE — H&P (Addendum)
History and Physical    Patient: Gregory [name] VOJ:[id number] DOB: [birth year] DOA: 03/31/2021 DOS: the patient was seen and examined on 03/31/2021 PCP: Orpah Greek, DO  Patient coming from: [facility] - lives at home with his son. Has 24 hour care.    Chief Complaint: covid 19   HPI: Gregory [name] is a 88 y.o. male with medical history significant of  A Fib s/p ablation, CHB s/p PPM, HTN,  HLD, hypothyroidism, neuropathy, lung nodule, TVAR, dysphagia and malnutrition s/p G tube (placed in [place] 02/10/21), CKD 3 stage who was admitted on 03/02/21 with hypotension, dizziness, near syncope and hgb 5.9. He received 2 units PRBC. Fecal occult was positive. Dr. Silverio Decamp with GI was consulted who recommended conservative management of NPO, IV PPI and transfusion as needed as heme positive stools likely secondary to NGT mucosal trauma. Eliquis was held. He was sent to CIR for functional decline.   TRH is being consulted due to increased lethargy, weakness and +covid 19. He has also spiked a fever to 102 and oxygen is at 90% on room air. He had covid 19 in June 2022 with 3 day hospitalization at [facility] with remdesivir. He has been vaccinated and received all 3 boosters from [employer].   He is sitting in bed and sleeping, but easily wakes up. Denies any headaches, dizziness, chest pain/palpitations, shortness of breath, has cough with thick/brown sputum, denies stomach pain, N/V/D, denies dysuria or leg swelling. He is cold and wants me to put his blanket back on him.    Vitals today: tmax: 101.7, bp: 96/45, hr: 61, RR: 16, oxygen: 90% RA Pertinent labs: NO wbc, hgb 7.7 (8.9 four days ago), fecal occult negative, covid positive XHB:ZJIR bibasilar opacities, likely atelectasis     Review of Systems: As mentioned in the history of present illness. All other systems reviewed and are negative. Past Medical History:  Diagnosis Date   Atrial flutter [facility])    s/p ablation   Cancer [facility])    skin  cancer   Cataracts, bilateral    Hypertension    Neuromuscular disorder ([place])    Pacemaker 2010   Thyroid disease    Past Surgical History:  Procedure Laterality Date   APPENDECTOMY     COLONOSCOPY     EYE SURGERY Bilateral    cataract surgery   HERNIA REPAIR Right    INGUINAL HERNIA REPAIR Left 10/12/2013   Procedure: LEFT  INGUINAL HERNIA REPAIR;  Surgeon: Odis Hollingshead, MD;  Location: [place];  Service: General;  Laterality: Left;   INSERTION OF MESH Left 10/12/2013   Procedure: INSERTION OF MESH;  Surgeon: Odis Hollingshead, MD;  Location: [place];  Service: General;  Laterality: Left;   PEG TUBE PLACEMENT Left    Social History:  reports that he has never smoked. He has never used smokeless tobacco. He reports that he does not drink alcohol and does not use drugs.  Allergies  Allergen Reactions   Cefepime Other (See Comments)    Severe myoclonus for cefepime- don't take again-    Amlodipine Besylate     Other reaction(s): edema   Penicillins Hives and Rash    Family History  Problem Relation Age of Onset   Heart disease Mother    Cancer Father        leukemia    Prior to Admission medications   Medication Sig Start Date End Date Taking? Authorizing Provider  acetaminophen (TYLENOL) 325 MG tablet Place 1-2 tablets (325-650 mg total)  into feeding tube every 4 (four) hours as needed for mild pain. 03/17/21   Love, Ivan Anchors, PA-C  atorvastatin (LIPITOR) 40 MG tablet Place 1 tablet (40 mg total) into feeding tube at bedtime. 03/17/21   Love, Ivan Anchors, PA-C  CALCIUM PO Take 1 tablet by mouth daily.    [provider]  Cholecalciferol (VITAMIN D3 PO) Take 1 tablet by mouth daily.    [provider]  ELIQUIS 2.5 MG TABS tablet TAKE 1 TABLET BY MOUTH TWICE A DAY 03/13/21   Martinique, Peter M, MD  folic acid (FOLVITE) 1 MG tablet Take 1 mg by mouth daily.    [provider]  guaiFENesin-dextromethorphan (ROBITUSSIN DM) 100-10 MG/5ML syrup Take 5 mLs by  mouth 2 (two) times daily. 03/10/21   Marianna Payment, MD  levothyroxine (SYNTHROID) 75 MCG tablet Place 1 tablet (75 mcg total) into feeding tube daily. 03/17/21   Love, Ivan Anchors, PA-C  Multiple Vitamin (MULTIVITAMIN) tablet Take 1 tablet by mouth daily.    [provider]  NIACIN PO Take 1 tablet by mouth daily.    [provider]  Nutritional Supplements (NUTREN 1.5 EN) 250 mLs by Enteral route in the morning, at noon, in the evening, and at bedtime.    [provider]  pantoprazole sodium (PROTONIX) 40 mg Place 40 mg into feeding tube daily. 03/10/21 06/08/21  Marianna Payment, MD  polyethylene glycol (MIRALAX) 17 g packet Place 17 g into feeding tube daily as needed for mild constipation or moderate constipation. 03/10/21   Marianna Payment, MD  senna (SENOKOT) 8.6 MG TABS tablet Place 2 tablets (17.2 mg total) into feeding tube daily. 03/17/21   Love, Ivan Anchors, PA-C  sucralfate (CARAFATE) 1 GM/10ML suspension Place 1 g into feeding tube every 6 (six) hours.    [provider]  vitamin B-12 (CYANOCOBALAMIN) 1000 MCG tablet Take 1,000 mcg by mouth daily.     [provider]  vitamin C (ASCORBIC ACID) 500 MG tablet Take 500 mg by mouth daily.    [provider]    Physical Exam: Vitals:   03/31/21 1840 03/31/21 1956  BP:  (!) 99/55  Pulse:  76  Resp:  19  Temp: 98.4 F (36.9 C) 98.9 F (37.2 C)  TempSrc: Oral Oral  SpO2: 92% 94%   General:  Appears calm and comfortable and is in NAD. Sleeping. Easily arouses Eyes:  PERRL, EOMI, normal lids, iris ENT:  hard of hearing, lips & tongue, dry mucous membranes; appropriate dentition Neck:  no LAD, masses or thyromegaly; no carotid bruits Cardiovascular:  RRR, systolic murmur. No LE edema.  Respiratory:   faint rales in RLL, otherwise normal exam   Normal respiratory effort.Marland Kitchen sputum brown and thick  Abdomen:  soft, NT, ND, NABS Back:   normal alignment, no CVAT Skin:  no rash or induration seen on  limited exam Musculoskeletal:  grossly normal tone BUE/BLE, globally weak.  good ROM, no bony abnormality Lower extremity:  No LE edema.  Limited foot exam with no ulcerations.  2+ distal pulses. Psychiatric:  grossly normal mood and affect, speech fluent and appropriate, AOx3 Neurologic:  CN 2-12 grossly intact, moves all extremities in coordinated fashion, sensation intact   Radiological Exams on Admission: Independently reviewed - see discussion in A/P where applicable  DG CHEST PORT 1 VIEW  Result Date: 03/31/2021 CLINICAL DATA:  Respiratory tract congestion with cough. EXAM: PORTABLE CHEST 1 VIEW COMPARISON:  03/16/2021 FINDINGS: Sequelae of transcatheter aortic valve replacement  are again identified. A pacemaker remains in place with leads projecting over the right atrium and right ventricle. Aortic atherosclerosis is noted. The cardiac silhouette remains mildly enlarged. Mild bibasilar lung opacities are noted. No sizable pleural effusion or pneumothorax is identified. No acute osseous abnormality is seen. IMPRESSION: Mild bibasilar opacities, likely atelectasis. Electronically Signed   By: Logan Bores M.D.   On: 03/31/2021 09:08     Labs on Admission: I have personally reviewed the available labs and imaging studies at the time of the admission.  Pertinent labs:   Labs ordered and pending     Assessment and Plan: COVID-19 virus infection- (present on admission) 88 year old with fever, increased lethargy, cough and weakness found to have covid-19 Admit to progressive Has been vaccinated and boosted x 3 with covid infection in 07/2020  Cycle threshold: 22.7 Droplet and airborne precautions Family would like everything done remdesivir Decadron with oxygen at 90% room air Albuterol prn robitussin DM per PEG IS/flutter to bedside Repeat CXR tomorrow  Fever- (present on admission) Likely due to covid, but has risk of aspiration -covid labs -sputum culture, lactic acid, blood  cultures and UA and urine culture pending -had a UTI and was tried on rocephin, but had myoclonic reaction. Family declining zosyn at this time. Discussed best options with pharmacy to cover for UTI/aspiration pneumonia with allergy to pcn/cephlosporoin. reccommended flagyl and aztreonam. Again, will wait for lactic acid, UA and blood cx as fever likely from covid infection.  -will f/u on labs before initiating abx  -sputum culture -CXR with bibasilar atelectasis, repeat 2V in AM   Dysphagia s/p G tube  Placed in [place] in 02/10/21 for malnutrition and dysphagia Continue tube cleaning/feeding SLP following  Ice chips only  Aspiration risk/precautions   Acute on chronic anemia with recent GI bleed- (present on admission) Admitted to hospital on 1/19 for symptomatic anemia with hgb to 5.9 GI consulted and recommended conservative management with IVF and PRBC over EGD eliquis held and he was placed on protonix.  hgb trending downward from two weeks ago at 8.9>7.7 today Follow cbc q6 hours, transfuse to keep hgb >7 Continue protonix BID, carafate and iron   Chronic atrial fibrillation s/p ablation - (present on admission) On telemetry, ekg pending eliquis held  Protein-calorie malnutrition, severe- (present on admission) Nutrition consult while in patient   CRI (chronic renal insufficiency), stage 3 (moderate) ([place])- (present on admission) Creatinine actually normal (1.1-1.2) prior to December and history of anorexia/malnutrition. Peaked at 3.0.  Creatinine has been trending downwards since his time here, was 1.39 on 2/14. Repeat pending today  Appears to be around 1.3-1.5 On free water with PEG, will need to watch closely with covid   S/P TAVR (transcatheter aortic valve replacement) 10/2018: TAVR with a #26 Sapien S3 Ultra valve in [place], [place].  Echo 10/20: EF 62% with slight increase in prosthetic aortic velocity   Hypothyroidism- (present on admission) TSH wnl  02/2021 Continue synthroid   Debility- (present on admission) Continue therapy with discharge to CIR if situation allows   Complete heart block s/p PPM - (present on admission) Stable, ekg pending     Advance Care Planning:   Code Status: Full Code   Consults: none   DVT Prophylaxis: SCDs.   Family Communication: daughter at bedside: Gregory [name]   Severity of Illness: The appropriate patient status for this patient is INPATIENT. Inpatient status is judged to be reasonable and necessary in order to provide the required intensity  of service to ensure the patient's safety. The patient's presenting symptoms, physical exam findings, and initial radiographic and laboratory data in the context of their chronic comorbidities is felt to place them at high risk for further clinical deterioration. Furthermore, it is not anticipated that the patient will be medically stable for discharge from the hospital within 2 midnights of admission.   * I certify that at the point of admission it is my clinical judgment that the patient will require inpatient hospital care spanning beyond 2 midnights from the point of admission due to high intensity of service, high risk for further deterioration and high frequency of surveillance required.*  Author: Orma Flaming, MD 03/31/2021 8:25 PM  For on call review [email or web address].

## 2021-03-31 NOTE — Progress Notes (Signed)
Discussed patient with Dr. Dagoberto Ligas earlier and respiratory panel ordered which which is positive for Covid 19. Family updated and wants everything done, Remdisivir and all available treatment --to not let his age be an hindrance. Contacted Dr. Yves Dill with [facility] to transfer patient to acute. Remdisivir ordered. Patient did spike a temp up to 101.7 with drop in BP to 96/45 during work up-->BC/sepsis markers ordered and patient to bolus with 250 cc with recheck BP. Dr. Yves Dill updated and recommended contacting ICU for input. PCCM consulted and recommended transfer to progressive care as well as ruling out UTI / pyelonephritis with imaging and to start patient on Zosyn (due to myoclonus SE w/cefepime). CT abdomen/pelvis ordered for work up will awaiting bed/transfer.

## 2021-03-31 NOTE — Assessment & Plan Note (Addendum)
Likely due to covid, but has risk of aspiration -covid labs -sputum culture, lactic acid, blood cultures and UA and urine culture pending -had a UTI and was tried on rocephin, but had myoclonic reaction. Family declining zosyn at this time. Discussed best options with pharmacy to cover for UTI/aspiration pneumonia with allergy to pcn/cephlosporoin. reccommended flagyl and aztreonam. Again, will wait for lactic acid, UA and blood cx as fever likely from covid infection.  -will f/u on labs before initiating abx  -sputum culture -CXR with bibasilar atelectasis, repeat 2V in AM

## 2021-03-31 NOTE — Progress Notes (Signed)
Physical Therapy Session Note  Patient Details  Name: Gregory [name] MRN: [id number] Date of Birth: 1920-03-09  Today's Date: 03/31/2021 PT Individual Time: 1300 PT Amount of Missed Time (min): 12 Minutes PT Missed Treatment Reason: Patient ill (Comment) (cough and weakening since last night, getting resp panel and COVID test)     Short Term Goals: Week 3:  PT Short Term Goal 1 (Week 3): =LTG due to ELOS  Skilled Therapeutic Interventions/Progress Updates:    Session Note: Chart reviewed.Pt and family greeted. Family member reported pt illness and weakness since last night. Family member stated pt had been too weak to participate in prior session and requested therapy return after results from medical testing. Pt consulted who presented with difficulty verbalizing, but repeatedly stated "weaker" in low volume. Pt was left in care of RN.   Therapy Documentation Precautions:  Precautions Precautions: Fall Precaution Comments: NPO with PEG tube, posterior lean Restrictions Weight Bearing Restrictions: No General: PT Amount of Missed Time (min): 45 Minutes PT Missed Treatment Reason: Patient ill (Comment) (cough and weakening since last night, getting resp panel and COVID test) Vital Signs: Therapy Vitals Temp: 98.2 F (36.8 C)   Therapy/Group: Individual Therapy  Marquette Saa, PT, DPT 03/31/2021, 1:19 PM

## 2021-03-31 NOTE — Assessment & Plan Note (Addendum)
TSH wnl 02/2021 Continue synthroid

## 2021-03-31 NOTE — Progress Notes (Signed)
Pt persistently coughing productively throughout the night following PRN Mucinex administration. Pt noted with congestion and clear thin sputum.

## 2021-03-31 NOTE — Discharge Summary (Signed)
Physician Discharge Summary  Patient ID: Gregory [name] MRN: [id number] DOB/AGE: 1920-04-28 88 y.o.  Admit date: 03/10/2021 Discharge date: 03/31/2021  Discharge Diagnoses:  Principal Problem:   Debility Active Problems:   Chronic atrial fibrillation (HCC)   Cardiac pacemaker in situ   CRI (chronic renal insufficiency), stage 3 (moderate) (HCC)   Protein-calorie malnutrition, severe   GIB (gastrointestinal bleeding)   Malaise and fatigue   Cough, unspecified   UTI (urinary tract infection)   Discharged Condition: serious  Significant Diagnostic Studies: DG Abd 1 View  Result Date: 03/22/2021 CLINICAL DATA:  Constipation. EXAM: ABDOMEN - 1 VIEW COMPARISON:  None. FINDINGS: Gastrostomy tube is seen over the left upper quadrant in expected position of the stomach. No abnormal bowel dilatation is noted. Moderate amount of stool seen throughout the colon. IMPRESSION: Moderate stool burden.  No abnormal bowel dilatation. Electronically Signed   By: Marijo Conception M.D.   On: 03/22/2021 09:00    DG CHEST PORT 1 VIEW  Result Date: 03/31/2021 CLINICAL DATA:  Respiratory tract congestion with cough. EXAM: PORTABLE CHEST 1 VIEW COMPARISON:  03/16/2021 FINDINGS: Sequelae of transcatheter aortic valve replacement are again identified. A pacemaker remains in place with leads projecting over the right atrium and right ventricle. Aortic atherosclerosis is noted. The cardiac silhouette remains mildly enlarged. Mild bibasilar lung opacities are noted. No sizable pleural effusion or pneumothorax is identified. No acute osseous abnormality is seen. IMPRESSION: Mild bibasilar opacities, likely atelectasis. Electronically Signed   By: Logan Bores M.D.   On: 03/31/2021 09:08   DG CHEST PORT 1 VIEW  Result Date: 03/16/2021 CLINICAL DATA:  Leukocytosis.  History of hypertension. EXAM: PORTABLE CHEST 1 VIEW COMPARISON:  03/02/2021 FINDINGS: Left chest wall pacer device identified with leads in the right atrial  appendage and right ventricle. Status post TAVR. Aortic atherosclerosis. Stable cardiomediastinal contours. Small pleural effusions are suspected. No airspace consolidation or interstitial edema. No acute osseous findings. IMPRESSION: Suspect small bilateral pleural effusions. Electronically Signed   By: Kerby Moors M.D.   On: 03/16/2021 09:21      Labs:  Basic Metabolic Panel: Recent Labs  Lab 03/26/21 0604 03/27/21 0611 03/28/21 0635  NA 139 136 137  K 4.2 4.3 4.1  CL 102 97* 98  CO2 26 31 31   GLUCOSE 88 98 93  BUN 35* 39* 37*  CREATININE 1.40* 1.52* 1.39*  CALCIUM 9.3 9.3 9.4    CBC: CBC Latest Ref Rng & Units 03/31/2021 03/27/2021 03/20/2021  WBC 4.0 - 10.5 K/uL 8.5 8.7 8.6  Hemoglobin 13.0 - 17.0 g/dL 7.7(L) 8.9(L) 8.9(L)  Hematocrit 39.0 - 52.0 % 23.4(L) 27.6(L) 28.7(L)  Platelets 150 - 400 K/uL 137(L) 198 179     CBG: Recent Labs  Lab 03/30/21 1221 03/30/21 1709 03/31/21 0003 03/31/21 0710 03/31/21 1218  GLUCAP 110* 102* 104* 98 117*    Brief HPI:   Gregory [name] is a 88 y.o. male with history of a flutter s/p ablation W PPM, HTN, neuropathy, lung nodule, TEVAR, HOH, dysphagia with weight loss s/p G-tube placement ([name] 02/10/2021) who was admitted on 03/02/21 with hypotension, dizziness, near syncope and hemoglobin-5.9.  He was treated with fluid bolus and 2 units PRBC with improvement in symptoms.  SPO2 was positive with reports of dark stools as well as reports of NGT trauma to GI tract as well as Hgb-7.0 at discharge from [facility].    Dr. Karlyn Agee was consulted for input and recommended conservative management with n.p.o., IV PPI and transfusion as  needed.  Eliquis was placed on hold and palliative care was consulted.  Family elected on full scope of practice/full code.  T-fastener from PEG tube was removed by radiology.  As hemoglobin stabilized tube feeds were resumed. Patient was noted to be deconditioned with therapy noting posterior lean on standing, unsteady  gait and difficulty completing ADLs.  CIR was recommended due to functional decline.   Hospital Course: Gregory [name] was admitted to rehab 03/10/2021 for inpatient therapies to consist of PT, ST and OT at least three hours five days a week. Past admission physiatrist, therapy team and rehab RN have worked together to provide customized collaborative inpatient rehab. During patient's stay in rehab weekly team conferences were held to monitor patient's progress, set goals and discuss barriers to discharge. At admission, patient required max assist with basic ADL tasks and mod assist with mobility. Speech therapy was consulted for input on swallow and dysphagia treatment.  Patient educated on 4 times daily oral care and was noted to have low voice volume.  RMT was initiated to improve vocal quality vocal quality and is cough strength.    Serial CBC done showed H/H to continue to fluctuate with heme positive stools. He was kept NPO with tube feeds ongoing. MBS was done to evaluate swallow on 01/31 and showed severe-moderate pharyngeal dysphagia with silent aspiration and NPO recommended with use of  Family had multiple questions about resumption of Eliquis.  Was noted to have ongoing positive stools and H&H continued to be variable.  Cardiology was  consulted  felt that his risk for ongoing bleeding was high and that he should not be on AC anymore.   Blood pressures were monitored on TID basis and were relatively stable. Blood sugars have been monitored every 4 hours due to tube feeds with SSI used for elevated BS.  Hospital course was significant for rise in WBC to 12.8 on 02/03 and UA/UCS done revealing > 100,000 citrobacter amalonaticus. He was started on cefepime for treatment but developed myoclonus SE. This was changed to septra X 4 days to complete 7 day course of treatment. He did have decline in functional status with weakness due to myoclonus therefore LOS was extended. Labs were monitored closely and  he was noted to develop acute on chronic renal failure therefore was treated with IVF for gentle hydration and water flushes were increased to 300 cc qid.   On evening of 02/16, he was reported to having increase in cough and next day was noted to have significant malaise. IVF were discontinued and he was diuresed with dose of lasix due to concerns of overload. Respiratory panel was ordered and positive for Covid. WBC was stable but he developed fever with hypotension. Dr. Yves Dill was consulted for input questioned need for ICU. Remdisiver ordered, Sepsis markers ordered and PCCM consulted for input, Progressive care recommended with work up to rule out pyelonephritis due to recent UTI. CT abdomen pelvis ordered. IV antibiotics to be ordered per Dr. Yves Dill.  He was discharged to progressive care for closer monitoring.   Current Medications:   atorvastatin  40 mg Per Tube QHS   chlorhexidine  15 mL Mouth Rinse BID   feeding supplement (NEPRO CARB STEADY)  315 mL Per Tube TID   ferrous sulfate  220 mg Per Tube BID WC   free water  300 mL Per Tube Q6H   levothyroxine  75 mcg Per Tube Q0600   mouth rinse  15 mL Mouth Rinse QID   pantoprazole  sodium  40 mg Per Tube BID AC   phosphorus  250 mg Per Tube Daily   senna-docusate  2 tablet Per Tube QHS   sucralfate  1 g Per Tube Q8H    Diet: NPO  Special instructions:  Oral care QID.  2.    Needs oral care prior to  small amount of ice chips for pleasure--one at at time with use of multiple swallows for safety.   Discharge disposition: Acute hospital/progressive care.     Signed: Bary Leriche 03/31/2021, 3:29 PM

## 2021-03-31 NOTE — Progress Notes (Signed)
PROGRESS NOTE   Subjective/Complaints:  Pt reports has cough- all night- feels aa little worse- clear sputum- "about the same or slightly worse"- Doing mucinex q6 hours.  Will stop IVFs that were given earlier in week. Since increased water flushes-    ROS:   Pt denies SOB, abd pain, CP, N/V/C/D, and vision changes     Objective:   No results found. No results for input(s): WBC, HGB, HCT, PLT in the last 72 hours.   No results for input(s): NA, K, CL, CO2, GLUCOSE, BUN, CREATININE, CALCIUM in the last 72 hours.    Intake/Output Summary (Last 24 hours) at 03/31/2021 0834 Last data filed at 03/31/2021 7209 Gross per 24 hour  Intake --  Output 1300 ml  Net -1300 ml     Pressure Injury 03/03/21 Heel Left Deep Tissue Pressure Injury - Purple or maroon localized area of discolored intact skin or blood-filled blister due to damage of underlying soft tissue from pressure and/or shear. (Active)  03/03/21 1114  Location: Heel  Location Orientation: Left  Staging: Deep Tissue Pressure Injury - Purple or maroon localized area of discolored intact skin or blood-filled blister due to damage of underlying soft tissue from pressure and/or shear.  Wound Description (Comments):   Present on Admission: Yes     Pressure Injury 03/10/21 Toe (Comment  which one) Anterior;Left;Distal Unstageable - Full thickness tissue loss in which the base of the injury is covered by slough (yellow, tan, gray, green or brown) and/or eschar (tan, brown or black) in the wound bed. (Active)  03/10/21 1730  Location: Toe (Comment  which one) (Left great toe at the tip.)  Location Orientation: Anterior;Left;Distal  Staging: Unstageable - Full thickness tissue loss in which the base of the injury is covered by slough (yellow, tan, gray, green or brown) and/or eschar (tan, brown or black) in the wound bed.  Wound Description (Comments): Black area at the tip  of the left great toe.  Present on Admission: Yes     Pressure Injury 03/10/21 Toe (Comment  which one) Anterior;Right;Distal Unstageable - Full thickness tissue loss in which the base of the injury is covered by slough (yellow, tan, gray, green or brown) and/or eschar (tan, brown or black) in the wound bed (Active)  03/10/21 1730  Location: Toe (Comment  which one) (Right great toe at the tip of the toe.)  Location Orientation: Anterior;Right;Distal  Staging: Unstageable - Full thickness tissue loss in which the base of the injury is covered by slough (yellow, tan, gray, green or brown) and/or eschar (tan, brown or black) in the wound bed.  Wound Description (Comments): Right great toe at the tip of the toe.  Present on Admission: Yes    Physical Exam: Vital Signs Blood pressure (!) 105/37, pulse 69, temperature 98.4 F (36.9 C), temperature source Oral, resp. rate 15, height 5\' 8"  (1.727 m), weight 104 kg, SpO2 91 %.       General: awake, alert, appropriate, just woke up- supine in bed- a little grumpy; NAD HENT: conjugate gaze; oropharynx moist CV: regular rate; no JVD Pulmonary: few rhonchi throughout- decreased at bases; coughing intermittent- wet cough GI: soft, NT, ND, (+)  BS Psychiatric: appropriate but a little grumpy about being woken up Neurological: alert- HOH Ext: no clubbing, cyanosis, or edema Psych: pleasant and cooperative  Skin: foot/heel wounds stable,  covered with dressings-foam- C/D/I Neuro:  HOH but able comprehend when he's able to hear. No focal language issues. Mild dysarthria. Strength grossly 4/5 bilateral UE/LE's. No major change noted today  No focal sensory abnl. No abnl tone.  Musculoskeletal: Full ROM, No pain with AROM or PROM in the neck, trunk, or extremities. Posture appropriate     Assessment/Plan: 1. Functional deficits which require 3+ hours per day of interdisciplinary therapy in a comprehensive inpatient rehab setting. Physiatrist is  providing close team supervision and 24 hour management of active medical problems listed below. Physiatrist and rehab team continue to assess barriers to discharge/monitor patient progress toward functional and medical goals  Care Tool:  Bathing    Body parts bathed by patient: Right arm, Left arm, Chest, Buttocks, Front perineal area, Abdomen, Right upper leg, Left upper leg, Face   Body parts bathed by helper: Buttocks     Bathing assist Assist Level: Minimal Assistance - Patient > 75%     Upper Body Dressing/Undressing Upper body dressing   What is the patient wearing?: Pull over shirt    Upper body assist Assist Level: Minimal Assistance - Patient > 75%    Lower Body Dressing/Undressing Lower body dressing      What is the patient wearing?: Incontinence brief, Pants     Lower body assist Assist for lower body dressing: Moderate Assistance - Patient 50 - 74%     Toileting Toileting    Toileting assist Assist for toileting: Maximal Assistance - Patient 25 - 49%     Transfers Chair/bed transfer  Transfers assist     Chair/bed transfer assist level: Moderate Assistance - Patient 50 - 74%     Locomotion Ambulation   Ambulation assist   Ambulation activity did not occur: Safety/medical concerns  Assist level: Minimal Assistance - Patient > 75% Assistive device: Walker-rolling Max distance: 121'   Walk 10 feet activity   Assist  Walk 10 feet activity did not occur: Safety/medical concerns  Assist level: Minimal Assistance - Patient > 75% Assistive device: Walker-rolling   Walk 50 feet activity   Assist Walk 50 feet with 2 turns activity did not occur: Safety/medical concerns  Assist level: Minimal Assistance - Patient > 75% Assistive device: Walker-rolling    Walk 150 feet activity   Assist Walk 150 feet activity did not occur: Safety/medical concerns  Assist level: Contact Guard/Touching assist Assistive device: Walker-rolling     Walk 10 feet on uneven surface  activity   Assist Walk 10 feet on uneven surfaces activity did not occur: Safety/medical concerns   Assist level: Minimal Assistance - Patient > 75% Assistive device: Walker-rolling   Wheelchair     Assist Is the patient using a wheelchair?: Yes Type of Wheelchair: Manual    Wheelchair assist level: Dependent - Patient 0%      Wheelchair 50 feet with 2 turns activity    Assist        Assist Level: Dependent - Patient 0%   Wheelchair 150 feet activity     Assist      Assist Level: Dependent - Patient 0%   Blood pressure (!) 105/37, pulse 69, temperature 98.4 F (36.9 C), temperature source Oral, resp. rate 15, height 5\' 8"  (1.727 m), weight 104 kg, SpO2 91 %.  Medical Problem List and Plan: 1.  Functional deficits secondary to debility             -patient may shower but PEG must be covered.              -ELOS/Goals: 2-3 weeks S-MinA             -d/c date 04/04/21 Doing ice chips with SLP- changed d/c to 2/15 due to IV ABX and progress to increase level of function 2/13 ongoing difficulty with weakness and activity tolerance with therapies. 2/17- Con't CIR- PT, OT and SLP 2.  Antithrombotics: -DVT/anticoagulation:  Mechanical: Sequential compression devices, below knee Bilateral lower extremities 1/31- although Hb is stable, pt still having black tarry stools- will consult Cards about Eliquis- I think it's concerning to restart right now, but will let Cards decide.  - Cards and GI agree NO ELIQUIS or Blood thinners.              -antiplatelet therapy: N/A 3. Pain Management: Tylenol 4. Mood:  LCSW to follow              -antipsychotic agents: N/A 5. Neuropsych: This patient is capable of making decisions on his own behalf. 6. Skin/Wound Care: pressure relief, protection to feet/heels 7. Fluids/Electrolytes/Nutrition: continue TF  -hyperkalemia: lokelma 5mg  daily x 2 doses, recheck labs Monday  2/1- see below under  hyperkalemia  2/6- K+ 3.8- will monitor  2/11- K+ 3.9- doing well  2/12- K+ 4.2--->>2/13 4.3  2/17- labs MOnday 8. A flutter s/p TAVR 2020: plan is to hold eliquis for now and resume in the next few weeks potentially --HR controlled 1/29  9. Severe dysphagia w/aspiration: NPO with tube feeds for nutritonal support.  10. GIB: Monitor for signs of bleeding. Continue Protonix and Carafate. Holding Eliquis.  1/30- Hb 8.9- was 10.2- but previous was 8.9 so likely not bleeding severely, but will recheck labs in AM 1/31- Hb stable at 8.9- will monitor 2/1- GI maximized PPI to BID and maintained carafate- will also add FeSol solution BID for low iron levels 11. CKD 3a: BUN/SCr 124/2.07             --improved to 35/1.4 on 1/28  1/30- Cr 1.42 and BUN 34- overall stable- con't to monitor  -monitor output, labs Monday   1/31- Cr down slightly to 1.33 and BUN 32- con't to monitor  2/1- wil recheck in AM  2/2- Cr 1.26 and BUN down to 30- from 36- doing better- con't to monitor  2/3- Cr 1.34 and BUN 31- is chronic/baseline- will reduce dose of Keflex as a result.   2/6- BUN better at 29 and Cr stable at 1.34- con't to monitor  2/13 BUN trending up to 39 over last 10 days.    -will run low rate IVF tonight  2/14- will need more water flushes with BUN trending up- will ask nutrition to help out.   2/15- BUN down slightly to 37 and Cr 1.39- BUN a little less, but not great- will improve over time/likely was Septra that had over weekend. If doesn't improve by Monday, will give more IVFs. 2/17- stop IVFs that was on board- labs Monday-   12. Constipation: Continue Senna daily. 2/16- LBM yesterday- going more regularly.  13. HLD: continue atorvastatin 40mg  daily.  14. Hypothyroidism: continue Synthroid.  15. Hyperkalemia- likely due to TfF'- has PEG.   1/31- given multiple doses of Lokelma- down to 4.8- but will recheck again Thursday. 2/2- K+ 4.2- so doing much better on Nepro  TF's 2/16- K+ 4.1- con't  regimen  16. NPO-due to esophageal stricture/severe dysphagia on TF's  1/31- 1 reason came into hospital with failure to thrive due to not eating/drinking- per chart review- SLP involved, but not sure if will be able to not use PEG.   2/2- failed MBS - cannot have anything by mouth  2/4: continue feeding via PEG  2/6- no issues with PEG- using abd binder  2/8- doesn't need abd binder when In bed- only when up 17. ABLA  2/1- will add Feosol solution BID to help with blood loss. 24: hgb 8/6 on 2/3, monitor weekly  18. Leukocytosis- UTI  2/2- Mild- WBC 11.1- will check CXR since is NPO and U/A and Cx to try and find source and recheck in AM  2/3- WBC up to 12.8- afebrile- however urine "so turbid couldn't run U/A"- has many bacteria- will recheck again in 3-4 days per d/w pharmacy- wil start Keflex 500 mg q12 hours- x 7 days- might need longer treatment, but will determine next week. Urine cloudy., odorous as well per nursing.    2/4: UC with >100,000 colonies citrobacter, f/u sensititivities  2/6- Change to Cefepime per d/w pharmacy- willl need 7 days- might need to extend him? Will check if can do anything PO?  2/7- pharm suggests Septra, but agrees that 88 yr old, not great choice- really needs IV ABX- will keep until 2/13  2/8- con't IV ABX until Sunday- recheck labs Monday  2/13 septra completed. Wbc's normal 8.7   19. Myoclonus due to Cefepime-  2/10 added to allergy list- called Neuro- will check ammonia and also start Keppra 250 mg BID to help- per Neuro- Sx's should improve in a few days off Cefepime.   2/11- Neurology said could take 3-4 days to get better- hard to test since occurs when gets up- asked nursing to get to chair today.  -2/12-13 some improvement. Pt with a lot of just generalized weakness.   2/14- will stop Keppra since myoclonus has resolved and monitor for resurgence. 2/15- no resurgence- will con't to monitor  20- Cough  2/17- likely due to a little fluid overload-  checking CXR_ might need Lasix? Will give 40 mg x1 PO and monitor CXR.   I spent a total of 51   minutes on total care today- >50% coordination of care- due to d/w nursing x2- as well as review of chart again; and reviewing CXR/planning.      LOS: 21 days A FACE TO FACE EVALUATION WAS PERFORMED  Mercades Bajaj 03/31/2021, 8:34 AM

## 2021-03-31 NOTE — Progress Notes (Signed)
Occupational Therapy Session Note  Patient Details  Name: Gregory [name] MRN: [id number] Date of Birth: 1920-12-03  Today's Date: 03/31/2021 OT Individual Time: 1000-1045 OT Individual Time Calculation (min): 45 min  and Today's Date: 03/31/2021 OT Missed Time: 15 Minutes Missed Time Reason: Patient fatigue;Patient ill (comment)   Short Term Goals: Week 3:  OT Short Term Goal 1 (Week 3): STG=LTG 2/2 ELOS (continued working to supervision goals)  Skilled Therapeutic Interventions/Progress Updates:    Pt resting in bed upon arrival with son and daughter present. Pt easily aroused. Pt with persistent cough this morning but agreeable to getting OOB. Pt requires max A this morning for supine>sit EOB and mod A for sitting balance. Pt requested to use toilet. Pt required max A for sit<>stand in [place]. Pt with significantly flexed trunk and unable to sit upright on Stedy paddles. Pt returned to EOB, for safety, and returned to supine. Pt incontinent of bowel. Pt required +2 assist for hygiene and changing clothing. Pt demonstrates significant decline in function from previous day and unable to tolerate sitting EOB. Pt remained in bed with all needs within reach. Pt's daughter present.   Therapy Documentation Precautions:  Precautions Precautions: Fall Precaution Comments: NPO with PEG tube, posterior lean Restrictions Weight Bearing Restrictions: No General: General OT Amount of Missed Time: 15 Minutes  Pain:  Pt denies pain this morning   Therapy/Group: Individual Therapy  Leroy Libman 03/31/2021, 10:52 AM

## 2021-03-31 NOTE — Assessment & Plan Note (Signed)
Stable, ekg pending

## 2021-03-31 NOTE — Progress Notes (Signed)
Speech Language Pathology Daily Session Note  Patient Details  Name: Gregory [name] MRN: [id number] Date of Birth: 1920/10/18  Today's Date: 03/31/2021 SLP Individual Time: 7867-5449 SLP Individual Time Calculation (min): 39 min  Short Term Goals: Week 4: SLP Short Term Goal 1 (Week 4): STGs=LTGs due to ELOS  Skilled Therapeutic Interventions: Patient was awake and supine in bed on arrival and agreeable to ST intervention with focus on swallowing goals. Pt endorsed not feeling well due to a cold but agreeable to participate if he could stay in bed. SLP facilitated session by providing set-up A for oral care using suction toothbrush and min A for thoroughness. Patient performed 18 repetitions of chin tuck against resistance (CTAR) using rolled up towel with mod-to-max A verbal/visual/tactile cues to execute during approximately 30% of occasions. Pt exhibited limited effort and suspect did not fully understand instructions despite max A explanation as well as completing task in prior ST sessions. Suspect fatigue also impacted effectiveness. Patient consumed single ice chips without overt s/sx of aspiration. Pt consumed x5 tsp of thin liquids (water) w/ cue to execute effortful swallow without overt s/sx of aspiration but delayed and productive coughing noted following trials. Patient was left in bed with alarm activated and immediate needs within reach at end of session. Continue per current plan of care.      Pain    Therapy/Group: Individual Therapy  [name] 03/31/2021, 3:20 PM

## 2021-03-31 NOTE — Assessment & Plan Note (Addendum)
10/2018: TAVR with a #26 Sapien S3 Ultra valve in [place], [place].  Echo 10/20: EF 62% with slight increase in prosthetic aortic velocity

## 2021-03-31 NOTE — Assessment & Plan Note (Signed)
Creatinine actually normal (1.1-1.2) prior to December and history of anorexia/malnutrition. Peaked at 3.0.  Creatinine has been trending downwards since his time here, was 1.39 on 2/14. Repeat pending today  Appears to be around 1.3-1.5 On free water with PEG, will need to watch closely with covid

## 2021-03-31 NOTE — Assessment & Plan Note (Addendum)
Admitted to hospital on 1/19 for symptomatic anemia with hgb to 5.9 GI consulted and recommended conservative management with IVF and PRBC over EGD eliquis held and he was placed on protonix.  hgb trending downward from two weeks ago at 8.9>7.7 today Follow cbc q6 hours, transfuse to keep hgb >7 Continue protonix BID, carafate and iron

## 2021-03-31 NOTE — Assessment & Plan Note (Signed)
Nutrition consult while in patient

## 2021-03-31 NOTE — Progress Notes (Signed)
Physical Therapy Note  Patient Details  Name: Gregory [name] MRN: [id number] Date of Birth: 19-Oct-1920 Today's Date: 03/31/2021    Physical Therapy Discharge Note  This patient was unable to complete the inpatient rehab program due to decline in medical status and transfer back to acute; therefore did not meet their long term goals. Pt left the program at a max/total assist level for their functional mobility/ transfers due to decline. This patient is being discharged from PT services at this time.  Pt's perception of pain in the last five days was unable to answer at this time.    See CareTool for functional status details  If the patient is able to return to inpatient rehabilitation within 3 midnights, this may be considered an interrupted stay and therapy services will resume as ordered. Modification and reinstatement of their goals will be made upon completion of therapy service reevaluations.     Gregory [name], PT, DPT, CBIS  03/31/2021, 5:28 PM

## 2021-03-31 NOTE — Assessment & Plan Note (Signed)
Placed in [facility] in 02/10/21 for malnutrition and dysphagia Continue tube cleaning/feeding SLP following  Ice chips only  Aspiration risk/precautions

## 2021-03-31 NOTE — Assessment & Plan Note (Signed)
Continue therapy with discharge to CIR if situation allows

## 2021-03-31 NOTE — Assessment & Plan Note (Signed)
On telemetry, ekg pending eliquis held

## 2021-04-01 ENCOUNTER — Other Ambulatory Visit: Payer: Self-pay

## 2021-04-01 ENCOUNTER — Inpatient Hospital Stay (HOSPITAL_COMMUNITY): Payer: Medicare Other

## 2021-04-01 ENCOUNTER — Encounter (HOSPITAL_COMMUNITY): Payer: Self-pay | Admitting: Family Medicine

## 2021-04-01 DIAGNOSIS — Z952 Presence of prosthetic heart valve: Secondary | ICD-10-CM

## 2021-04-01 DIAGNOSIS — U071 COVID-19: Principal | ICD-10-CM

## 2021-04-01 DIAGNOSIS — I482 Chronic atrial fibrillation, unspecified: Secondary | ICD-10-CM | POA: Diagnosis not present

## 2021-04-01 DIAGNOSIS — Z515 Encounter for palliative care: Secondary | ICD-10-CM

## 2021-04-01 DIAGNOSIS — R5381 Other malaise: Secondary | ICD-10-CM

## 2021-04-01 DIAGNOSIS — Z931 Gastrostomy status: Secondary | ICD-10-CM | POA: Diagnosis not present

## 2021-04-01 DIAGNOSIS — R1312 Dysphagia, oropharyngeal phase: Secondary | ICD-10-CM

## 2021-04-01 DIAGNOSIS — N183 Chronic kidney disease, stage 3 unspecified: Secondary | ICD-10-CM

## 2021-04-01 DIAGNOSIS — R1310 Dysphagia, unspecified: Secondary | ICD-10-CM | POA: Diagnosis not present

## 2021-04-01 DIAGNOSIS — R051 Acute cough: Secondary | ICD-10-CM

## 2021-04-01 DIAGNOSIS — E039 Hypothyroidism, unspecified: Secondary | ICD-10-CM

## 2021-04-01 DIAGNOSIS — Z789 Other specified health status: Secondary | ICD-10-CM

## 2021-04-01 DIAGNOSIS — D649 Anemia, unspecified: Secondary | ICD-10-CM

## 2021-04-01 LAB — CBC
HCT: 23.4 % — ABNORMAL LOW (ref 39.0–52.0)
Hemoglobin: 7.7 g/dL — ABNORMAL LOW (ref 13.0–17.0)
MCH: 31.7 pg (ref 26.0–34.0)
MCHC: 32.9 g/dL (ref 30.0–36.0)
MCV: 96.3 fL (ref 80.0–100.0)
Platelets: 130 10*3/uL — ABNORMAL LOW (ref 150–400)
RBC: 2.43 MIL/uL — ABNORMAL LOW (ref 4.22–5.81)
RDW: 17.2 % — ABNORMAL HIGH (ref 11.5–15.5)
WBC: 6.4 10*3/uL (ref 4.0–10.5)
nRBC: 0 % (ref 0.0–0.2)

## 2021-04-01 LAB — URINALYSIS, ROUTINE W REFLEX MICROSCOPIC
Bilirubin Urine: NEGATIVE
Glucose, UA: NEGATIVE mg/dL
Hgb urine dipstick: NEGATIVE
Ketones, ur: NEGATIVE mg/dL
Nitrite: NEGATIVE
Protein, ur: NEGATIVE mg/dL
Specific Gravity, Urine: 1.008 (ref 1.005–1.030)
pH: 5 (ref 5.0–8.0)

## 2021-04-01 LAB — COMPREHENSIVE METABOLIC PANEL
ALT: 27 U/L (ref 0–44)
AST: 36 U/L (ref 15–41)
Albumin: 2.9 g/dL — ABNORMAL LOW (ref 3.5–5.0)
Alkaline Phosphatase: 57 U/L (ref 38–126)
Anion gap: 9 (ref 5–15)
BUN: 62 mg/dL — ABNORMAL HIGH (ref 8–23)
CO2: 26 mmol/L (ref 22–32)
Calcium: 8.8 mg/dL — ABNORMAL LOW (ref 8.9–10.3)
Chloride: 94 mmol/L — ABNORMAL LOW (ref 98–111)
Creatinine, Ser: 1.57 mg/dL — ABNORMAL HIGH (ref 0.61–1.24)
GFR, Estimated: 40 mL/min — ABNORMAL LOW (ref >60)
Glucose, Bld: 203 mg/dL — ABNORMAL HIGH (ref 70–99)
Potassium: 3.7 mmol/L (ref 3.5–5.1)
Sodium: 129 mmol/L — ABNORMAL LOW (ref 135–145)
Total Bilirubin: 0.6 mg/dL (ref 0.3–1.2)
Total Protein: 6.2 g/dL — ABNORMAL LOW (ref 6.5–8.1)

## 2021-04-01 LAB — GLUCOSE, CAPILLARY
Glucose-Capillary: 111 mg/dL — ABNORMAL HIGH (ref 70–99)
Glucose-Capillary: 120 mg/dL — ABNORMAL HIGH (ref 70–99)

## 2021-04-01 LAB — FERRITIN: Ferritin: 918 ng/mL — ABNORMAL HIGH (ref 24–336)

## 2021-04-01 LAB — C-REACTIVE PROTEIN: CRP: 4.3 mg/dL — ABNORMAL HIGH (ref <1.0)

## 2021-04-01 LAB — D-DIMER, QUANTITATIVE: D-Dimer, Quant: 0.85 ug/mL-FEU — ABNORMAL HIGH (ref 0.00–0.50)

## 2021-04-01 LAB — PHOSPHORUS: Phosphorus: 4.1 mg/dL (ref 2.5–4.6)

## 2021-04-01 LAB — MAGNESIUM: Magnesium: 2 mg/dL (ref 1.7–2.4)

## 2021-04-01 MED ORDER — ENOXAPARIN SODIUM 30 MG/0.3ML IJ SOSY
30.0000 mg | PREFILLED_SYRINGE | INTRAMUSCULAR | Status: DC
  Administered 2021-04-02 – 2021-04-05 (×3): 30 mg via SUBCUTANEOUS
  Filled 2021-04-01 (×4): qty 0.3

## 2021-04-01 MED ORDER — FREE WATER
300.0000 mL | Freq: Four times a day (QID) | Status: DC
  Administered 2021-04-01 – 2021-04-05 (×14): 300 mL

## 2021-04-01 MED ORDER — NEPRO/CARBSTEADY PO LIQD
315.0000 mL | Freq: Three times a day (TID) | ORAL | Status: DC
  Administered 2021-04-01 – 2021-04-05 (×11): 315 mL

## 2021-04-01 NOTE — Evaluation (Signed)
Occupational Therapy Evaluation Patient Details Name: Gregory [name] MRN: [id number] DOB: 08/10/1920 Today's Date: 04/01/2021   History of Present Illness 88 y.o. male with medical history significant of  A Fib s/p ablation, CHB s/p PPM, HTN,  HLD, hypothyroidism, neuropathy, lung nodule, TVAR, dysphagia and malnutrition s/p G tube (placed in [place] 02/10/21), CKD 3 stage who was admitted on 03/02/21 with hypotension, dizziness, near syncope and hgb 5.9. +GI bleed; transfer to AIR with return to acute hospital 03/31/21 due to fever, weakness, COVID+   Clinical Impression   This 88 yo male admitted from AIR back to acute care with above presents to acute OT with needing increased A since he first came to hospital 03/02/21 and was on AIR working on getting stronger and more independent to return home. Currently he is  setup-total A for basic ADLs and Max A +2 for all mobility. He will continue to benefit from acute OT with follow up back on AIR.     Recommendations for follow up therapy are one component of a multi-disciplinary discharge planning process, led by the attending physician.  Recommendations may be updated based on patient status, additional functional criteria and insurance authorization.   Follow Up Recommendations  Acute inpatient rehab (3hours/day)    Assistance Recommended at Discharge Frequent or constant Supervision/Assistance  Patient can return home with the following Two people to help with walking and/or transfers;A lot of help with bathing/dressing/bathroom;Assistance with cooking/housework;Assist for transportation;Help with stairs or ramp for entrance;Direct supervision/assist for financial management;Direct supervision/assist for medications management    Functional Status Assessment  Patient has had a recent decline in their functional status and demonstrates the ability to make significant improvements in function in a reasonable and predictable amount of time.   Equipment Recommendations  Other (comment) (TBD next venue)       Precautions / Restrictions Precautions Precautions: Fall Precaution Comments: NPO with PEG tube, posterior lean Restrictions Weight Bearing Restrictions: No      Mobility Bed Mobility Overal bed mobility: Needs Assistance Bed Mobility: Rolling, Sidelying to Sit, Sit to Sidelying Rolling: Mod assist Sidelying to sit: +2 for physical assistance, Max assist     Sit to sidelying: Max assist, +2 for physical assistance General bed mobility comments: increased posterior lean requiring incr assist of 2 people    Transfers Overall transfer level: Needs assistance Equipment used: 2 person hand held assist Transfers: Sit to/from Stand Sit to Stand: Max assist, +2 physical assistance, From elevated surface           General transfer comment: from EOB (elevated) x 2 reps; each with significant posterior lean and feet blocked to prevent sliding; pt denies fear of falling forward      Balance Overall balance assessment: Needs assistance Sitting-balance support: No upper extremity supported, Feet supported Sitting balance-Leahy Scale: Poor   Postural control: Posterior lean Standing balance support: Bilateral upper extremity supported, During functional activity Standing balance-Leahy Scale: Poor Standing balance comment: Reliant on UE support                           ADL either performed or assessed with clinical judgement   ADL Overall ADL's : Needs assistance/impaired Eating/Feeding: NPO Eating/Feeding Details (indicate cue type and reason): except for ice chips after mouth care Grooming: Wash/dry face;Set up;Bed level   Upper Body Bathing: Minimal assistance;Bed level   Lower Body Bathing: Total assistance;Bed level   Upper Body Dressing : Total assistance;Bed level  Lower Body Dressing: Total assistance;Bed level                       Vision Baseline Vision/History: 1 Wears  glasses              Pertinent Vitals/Pain Pain Assessment Pain Assessment: No/denies pain     Hand Dominance Right   Extremity/Trunk Assessment Upper Extremity Assessment Upper Extremity Assessment: Generalized weakness   Lower Extremity Assessment Lower Extremity Assessment: Generalized weakness   Cervical / Trunk Assessment Cervical / Trunk Assessment: Kyphotic   Communication Communication Communication: HOH   Cognition Arousal/Alertness: Awake/alert Behavior During Therapy: WFL for tasks assessed/performed Overall Cognitive Status: Within Functional Limits for tasks assessed                                 General Comments: WFL for simple tasks, oriented to self, month, year, location, not situation                Home Living Family/patient expects to be discharged to:: Private residence Living Arrangements: Children Available Help at Discharge: Family;Available 24 hours/day;Personal care attendant Type of Home: House Home Access: Stairs to enter [employer] of Steps: 2 Entrance Stairs-Rails: None Home Layout: Two level;Able to live on main level with bedroom/bathroom;1/2 bath on main level;Bed/bath upstairs   Alternate Level Stairs-Rails: Right Bathroom Shower/Tub: Occupational psychologist: Handicapped height Bathroom Accessibility: Yes How Accessible: Accessible via wheelchair;Accessible via walker Home Equipment: [place] - single point;Grab bars - tub/shower;Grab bars - toilet;Hand held shower head;Shower seat   Additional Comments: Per pt, son lives at home and does not work, can provide assist as needed      Prior Functioning/Environment Prior Level of Function : Needs assist             Mobility Comments: since AIR admission, requiring min assist to walk 10 ft with RW ADLs Comments: Does bird bathing on first floor; but handicapped accessible bathroom is upstairs        OT Problem List: Decreased  strength;Impaired balance (sitting and/or standing);Decreased knowledge of use of DME or AE;Decreased safety awareness      OT Treatment/Interventions: Self-care/ADL training;Therapeutic exercise;DME and/or AE instruction;Therapeutic activities;Patient/family education;Balance training    OT Goals(Current goals can be found in the care plan section) Acute Rehab OT Goals OT Goal Formulation: With patient/family Time For Goal Achievement: 04/15/21 Potential to Achieve Goals: Good  OT Frequency: Min 2X/week    Co-evaluation PT/OT/SLP Co-Evaluation/Treatment: Yes Reason for Co-Treatment: Complexity of the patient's impairments (multi-system involvement);For patient/therapist safety;To address functional/ADL transfers PT goals addressed during session: Mobility/safety with mobility;Proper use of DME;Balance OT goals addressed during session: ADL's and self-care;Strengthening/ROM      AM-PAC OT "6 Clicks" Daily Activity     Outcome Measure Help from another person eating meals?: Total (NPO) Help from another person taking care of personal grooming?: A Little Help from another person toileting, which includes using toliet, bedpan, or urinal?: Total Help from another person bathing (including washing, rinsing, drying)?: A Lot Help from another person to put on and taking off regular upper body clothing?: A Lot Help from another person to put on and taking off regular lower body clothing?: Total 6 Click Score: 1   End of Session Equipment Utilized During Treatment: Gait belt Nurse Communication:  (IV had been paused and not sure why)  Activity Tolerance: Patient tolerated treatment well Patient  left: in bed;with call bell/phone within reach (in chair position)  OT Visit Diagnosis: Unsteadiness on feet (R26.81);Other abnormalities of gait and mobility (R26.89);Muscle weakness (generalized) (M62.81);Other symptoms and signs involving cognitive function                Time: 7353-2992 OT Time  Calculation (min): 36 min Charges:  OT General Charges $OT Visit: 1 Visit OT Evaluation $OT Eval Moderate Complexity: 1 Mod  [place] Circle, OTR/L Acute [employer] Pager [phone] Office [phone]    Almon Register 04/01/2021, 3:19 PM

## 2021-04-01 NOTE — Progress Notes (Signed)
Initial Nutrition Assessment  DOCUMENTATION CODES:   Not applicable  INTERVENTION:   Continue bolus tube feeding regimen via PEG: - 315 ml Nepro formula TID at 1000, 1400, 1900 - Free water flushes of 300 ml QID at 0800, 1200, 1600, 2100  Bolus tube feeding regimen provides 1701 kcal, 77 grams of protein, and 687 ml of H2O.   Total free water with flushes: 1887 ml  NUTRITION DIAGNOSIS:   Inadequate oral intake related to inability to eat as evidenced by NPO status.  GOAL:   Patient will meet greater than or equal to 90% of their needs  MONITOR:   Labs, Weight trends, TF tolerance, Skin, I & O's  REASON FOR ASSESSMENT:   Consult Enteral/tube feeding initiation and management, Assessment of nutrition requirement/status  ASSESSMENT:   88 year old male with PMH of atrial fibrillation, CHB s/p PPM, HTN, HLD, hypothyroidism, neuropathy, lung nodule, dysphagia and malnutrition s/p G-tube, CKD stage III. Pt initially admitted on 03/02/21 with hypotension, dizziness, low hemoglobin. Pt admitted to CIR for functional declined. Pt required transfer back to acute care on 2/17 due to increased lethargy, weakness, COVID-19.  RD working remotely. Unable to reach pt/family via phone call to room. RD consulted to resume previous tube feeds.  Reviewed weight history in chart. Weight is up over the last month from 73 kg on 03/10/21 to 104 kg currently. Question component of volume overload. Per nursing documentation, pt with mild pitting generalized edema.  Pt with history of severe malnutrition. Suspect malnutrition persists but unable to confirm without NFPE.  Current TF: 315 ml Nepro TID, free water flushes of 300 ml q 6 hours  Medications reviewed and include: IV decadron, ferrous sulfate, protonix, K phos 250 mg daily, senna, sucralfate, remdesivir  Labs reviewed: sodium 129, BUN 62, creatinine 1.57, CRP 4.3, hemoglobin 7.7, platelets 130 CBG's: 104-124 x 24 hours  UOP: 600 ml x 12  hours  NUTRITION - FOCUSED PHYSICAL EXAM:  Unable to complete at this time. RD working remotely.  Diet Order:   Diet Order             Diet NPO time specified Except for: Ice Chips  Diet effective now                   EDUCATION NEEDS:   No education needs have been identified at this time  Skin:  Skin Assessment: Skin Integrity Issues: DTI: L heel Unstageable: L foot toe, R foot toe  Last BM:  03/31/21  Height:   Ht Readings from Last 1 Encounters:  04/01/21 5\' 8"  (1.727 m)    Weight:   Wt Readings from Last 1 Encounters:  04/01/21 104 kg    BMI:  Body mass index is 34.86 kg/m.  Estimated Nutritional Needs:   Kcal:  1650-1850  Protein:  75-90 grams  Fluid:  1.6-1.8 L    Gustavus Bryant, MS, RD, LDN Inpatient Clinical Dietitian Please see AMi[facility] for contact information.

## 2021-04-01 NOTE — Evaluation (Signed)
Physical Therapy Evaluation Patient Details Name: Gregory [name] MRN: [id number] DOB: [birth year] Today's Date: 04/01/2021  History of Present Illness  88 y.o. male with medical history significant of  A Fib s/p ablation, CHB s/p PPM, HTN,  HLD, hypothyroidism, neuropathy, lung nodule, TVAR, dysphagia and malnutrition s/p G tube (placed in [place] 02/10/21), CKD 3 stage who was admitted on 03/02/21 with hypotension, dizziness, near syncope and hgb 5.9. +GI bleed; transfer to AIR with return to acute hospital 03/31/21 due to fever, weakness, COVID+  Clinical Impression   Pt admitted secondary to problem above with deficits below. PTA patient was a patient on AIR undergoing intensive therapies. He'd experienced a recent decline due to illness with only walking 10 ft with RW. Pt currently requires +2 max assist to come to stand due to strong posterior lean. Anticipate patient will benefit from PT to address problems listed below.Will continue to follow acutely to maximize functional mobility independence and safety.          Recommendations for follow up therapy are one component of a multi-disciplinary discharge planning process, led by the attending physician.  Recommendations may be updated based on patient status, additional functional criteria and insurance authorization.  Follow Up Recommendations Acute inpatient rehab (3hours/day)    Assistance Recommended at Discharge Frequent or constant Supervision/Assistance  Patient can return home with the following  Assistance with cooking/housework;Assist for transportation;Help with stairs or ramp for entrance;Two people to help with walking and/or transfers;Two people to help with bathing/dressing/bathroom    Equipment Recommendations Rolling walker (2 wheels);BSC/3in1;Wheelchair (measurements PT)  Recommendations for Other Services  Rehab consult    Functional Status Assessment Patient has had a recent decline in their functional status  and demonstrates the ability to make significant improvements in function in a reasonable and predictable amount of time.     Precautions / Restrictions Precautions Precautions: Fall Precaution Comments: NPO with PEG tube, posterior lean      Mobility  Bed Mobility Overal bed mobility: Needs Assistance Bed Mobility: Rolling, Sidelying to Sit, Sit to Sidelying Rolling: Mod assist Sidelying to sit: +2 for physical assistance, Max assist     Sit to sidelying: Max assist, +2 for physical assistance General bed mobility comments: increased posterior lean requiring incr assist of 2 people    Transfers Overall transfer level: Needs assistance Equipment used: 2 person hand held assist Transfers: Sit to/from Stand Sit to Stand: Max assist, +2 physical assistance, From elevated surface           General transfer comment: from EOB (elevated) x 2 reps; each with significant posterior lean and feet blocked to prevent sliding; pt denies fear of falling forward    Ambulation/Gait             Pre-gait activities: unable to clear feet in standing to side step toward [facility]    Stairs            Wheelchair Mobility    Modified Rankin (Stroke Patients Only)       Balance Overall balance assessment: Needs assistance Sitting-balance support: No upper extremity supported, Feet supported Sitting balance-Leahy Scale: Poor   Postural control: Posterior lean Standing balance support: Bilateral upper extremity supported, During functional activity Standing balance-Leahy Scale: Poor Standing balance comment: Reliant on UE support                             Pertinent Vitals/Pain Pain Assessment Pain Assessment: No/denies pain  Home Living Family/patient expects to be discharged to:: Private residence Living Arrangements: Children Available Help at Discharge: Family;Available 24 hours/day;Personal care attendant Type of Home: House Home Access: Stairs to  enter Entrance Stairs-Rails: None Entrance Stairs-Number of Steps: 2   Home Layout: Two level;Able to live on main level with bedroom/bathroom;1/2 bath on main level;Bed/bath upstairs Home Equipment: Cane - single point;Grab bars - tub/shower;Grab bars - toilet;Hand held shower head;Shower seat Additional Comments: Per pt, son lives at home and does not work, can provide assist as needed    Prior Function Prior Level of Function : Needs assist             Mobility Comments: since AIR admission, requiring min assist to walk 10 ft with RW ADLs Comments: Does bird bathing on first floor; but handicapped accessible bathroom is upstairs     Hand Dominance   Dominant Hand: Right    Extremity/Trunk Assessment   Upper Extremity Assessment Upper Extremity Assessment: Defer to OT evaluation    Lower Extremity Assessment Lower Extremity Assessment: Generalized weakness    Cervical / Trunk Assessment Cervical / Trunk Assessment: Kyphotic  Communication   Communication: HOH  Cognition Arousal/Alertness: Awake/alert Behavior During Therapy: WFL for tasks assessed/performed Overall Cognitive Status: Within Functional Limits for tasks assessed                                 General Comments: WFL for simple tasks, oriented to self, month, year, location, not situation        General Comments      Exercises     Assessment/Plan    PT Assessment Patient needs continued PT services  PT Problem List Decreased strength;Decreased activity tolerance;Decreased balance;Decreased mobility;Decreased knowledge of use of DME       PT Treatment Interventions DME instruction;Gait training;Stair training;Functional mobility training;Therapeutic activities;Therapeutic exercise;Balance training;Patient/family education    PT Goals (Current goals can be found in the Care Plan section)  Acute Rehab PT Goals Patient Stated Goal: Regain independence, return home PT Goal  Formulation: With patient Time For Goal Achievement: 04/15/21 Potential to Achieve Goals: Good    Frequency Min 3X/week     Co-evaluation PT/OT/SLP Co-Evaluation/Treatment: Yes Reason for Co-Treatment: Complexity of the patient's impairments (multi-system involvement);For patient/therapist safety;To address functional/ADL transfers PT goals addressed during session: Mobility/safety with mobility;Balance;Strengthening/ROM         AM-PAC PT "6 Clicks" Mobility  Outcome Measure Help needed turning from your back to your side while in a flat bed without using bedrails?: A Lot Help needed moving from lying on your back to sitting on the side of a flat bed without using bedrails?: Total Help needed moving to and from a bed to a chair (including a wheelchair)?: Total Help needed standing up from a chair using your arms (e.g., wheelchair or bedside chair)?: Total Help needed to walk in hospital room?: Total Help needed climbing 3-5 steps with a railing? : Total 6 Click Score: 7    End of Session Equipment Utilized During Treatment: Gait belt Activity Tolerance: Patient tolerated treatment well Patient left: in bed;with call bell/phone within reach;Other (comment) (in chair position due to no chair alarm pads on unit)   PT Visit Diagnosis: Other abnormalities of gait and mobility (R26.89);Muscle weakness (generalized) (M62.81);Unsteadiness on feet (R26.81);Difficulty in walking, not elsewhere classified (R26.2)    Time: 5956-3875 PT Time Calculation (min) (ACUTE ONLY): 36 min   Charges:   PT  Evaluation $PT Eval Moderate Complexity: [place], PT Acute Rehabilitation Services  Pager [phone] Office [phone]   Rexanne Mano 04/01/2021, 12:40 PM

## 2021-04-01 NOTE — Progress Notes (Signed)
Subjective:   Patient was initially evaluated by [facility] for fever, increased lethargy, cough, and weakness and was found to be COVID 19 positive. He was subsequently transferred to our service as he was recently admitted by IMTS for GIB before discharging to CIR.   This AM, patient denies any SOB, chest pain, chills. He has some cough. Otherwise, no acute complaints.   Objective:  Vital signs in last 24 hours: Vitals:   04/01/21 0215 04/01/21 0311 04/01/21 0925 04/01/21 1233  BP:  (!) 106/56 108/76 115/61  Pulse:  71    Resp:  20    Temp:  97.7 F (36.5 C) (!) 97.3 F (36.3 C) 97.8 F (36.6 C)  TempSrc:  Axillary Oral Oral  SpO2:  94% 97%   Weight: 104 kg     Height: 5\' 8"  (1.727 m)      Physical Exam  Constitutional: ill appearing and in no distress.  HENT:  Head: Normocephalic and atraumatic.  Eyes: EOM are normal.  Neck: Normal range of motion.  Cardiovascular: Normal rate, regular rhythm, intact distal pulses. No gallop and no friction rub. No lower extremity edema  Pulmonary: Non labored breathing on room air, no wheezing or rales  Abdominal: Soft. Normal bowel sounds. Non distended and non tender Musculoskeletal: Decreased bulk       General: No tenderness or edema.  Neurological: Alert and oriented to person, place, and time.  Skin: Skin is warm and dry.    Assessment/Plan:  Principal Problem:   COVID-19 Active Problems:   Chronic atrial fibrillation s/p ablation    S/P TAVR (transcatheter aortic valve replacement)   CRI (chronic renal insufficiency), stage 3 (moderate) (HCC)   Dysphagia s/p G tube    Protein-calorie malnutrition, severe   Debility   COVID-19 virus infection   Acute on chronic anemia with recent GI bleed   Complete heart block s/p PPM    Fever   Hypothyroidism  Gregory [name] is a 88 y.o. male with medical history significant of  A Fib s/p ablation, CHB s/p PPM, HTN,  HLD, hypothyroidism, neuropathy, lung nodule, TAVR, dysphagia and  malnutrition s/p G tube (placed in [place] 02/10/21), CKD 3 stage h/w fever, cough, and weakness found to be COVID 19 positive.    #COVID-19 infection #Hypoxemia  Patient presented from [facility] after having a fever up to 102 F, cough, and weakness.  He was found to be COVID-19 positive.  His last fever was 1531 yesterday up to 101.32F. He is saturating well on room air, greater than 94%.  He has no leukocytosis. He has elevated inflammatory markers and his procalcitonin is negative. His chest x-ray did show bibasilar opacities concerning for atelectasis.  Repeat chest x-ray this a.m. notable for possible interstitial edema.  He received remdesivir and decadron for COVID. Of note patient treated for COVID 19 infection in 07/2020 at [facility] with remdesivir and steroids. He is fully vaccinated and has received his booster x 3.  -Continue decadron, d/c remdesivir -Aggressive pulmonary toilet  #Dysphagia s/p G tube Unclear etiology of his dysphagia. PEG tube placed at [facility] in [facility] 02/10/2021. SLP re-evaluated patient this AM and noted that patient should have ice chips for comfort only and be NPO all other times. He is currently receiving bolus Tfs via his PEG. Continue aspiration risk.   #Anemia d/t ACD and GIB He was admitted to [facility] 1/10 for symptomatic anemia and found to have Hgb to 5.9. GI was consulted and conservative management with IVF  and transfusions was recommended in discussions with patient's family over EGD. His eliquis was held as well. His hgb 7.7 from 8.9 yesterday but remains stable this AM.  -Transfuse for hgb<7 -Continue IV protonix BID, carafate, and Fe supplementation.   #Atrial flutter/fib s/p ablation  #CHB s/p PPM  On telemetry, ekg on admission with ventricul paced rhythm. Per cardiology consult note from 03/10/2021. Patient's family decided along with cardiology that eliquis should be held.  -Continue to hold eliquis  -F/u ekg   #CKD stage 3 BL sCr previously thought to be  around 1.1-1.2 maybe 1.3-1.5 at this time. Around 1.5 this AM.  -CTM  -Avoid nephrotoxic agents -Receiving nutrition and free water via his PEG tube.   #Hypothyroidism Continue with home synthroid   #S/P TAVR (transcatheter aortic valve replacement) 10/2018: TAVR with a #26 Sapien S3 Ultra valve in [place], [place].  Echo 10/20: EF 62% with slight increase in prosthetic aortic velocity [1]  1 Admission H&P.   Advance Care Planning:   Code Status: Full Code  Consults: none  DVT Prophylaxis: SCDs.  Anticipated Discharge Location: CIR  Barriers to Discharge: None  Dispo: Anticipated discharge in approximately 2 day(s).   Rick Duff, MD 04/01/2021, 1:37 PM Rick Duff, MD PGY-2 Internal Medicine  Pager [phone]  After 5pm on weekdays and 1pm on weekends: On Call pager [phone]

## 2021-04-01 NOTE — Evaluation (Addendum)
Clinical/Bedside Swallow Evaluation Patient Details  Name: Lois Ostrom MRN: [id number] Date of Birth: [birth year]  Today's Date: 04/01/2021 Time: SLP Start Time (ACUTE ONLY): 44 SLP Stop Time (ACUTE ONLY): 1050 SLP Time Calculation (min) (ACUTE ONLY): 12 min  Past Medical History:  Past Medical History:  Diagnosis Date   Atrial flutter ([place])    s/p ablation   Cancer ([place])    skin cancer   Cataracts, bilateral    Hypertension    Neuromuscular disorder ([place])    Pacemaker 2010   Thyroid disease    Past Surgical History:  Past Surgical History:  Procedure Laterality Date   APPENDECTOMY     COLONOSCOPY     EYE SURGERY Bilateral    cataract surgery   HERNIA REPAIR Right    INGUINAL HERNIA REPAIR Left 10/12/2013   Procedure: LEFT  INGUINAL HERNIA REPAIR;  Surgeon: Odis Hollingshead, MD;  Location: [place];  Service: General;  Laterality: Left;   INSERTION OF MESH Left 10/12/2013   Procedure: INSERTION OF MESH;  Surgeon: Odis Hollingshead, MD;  Location: [place];  Service: General;  Laterality: Left;   PEG TUBE PLACEMENT Left    HPI:  Ashur Glatfelter is a 88 y.o. male who was D/Cd from inpt rehab to acute care due to new dx COVID 19 with fever. Prior medical history significant for A Fib s/p ablation, CHB s/p PPM, HTN,  HLD, hypothyroidism, neuropathy, lung nodule, TVAR, dysphagia and malnutrition s/p G tube (placed in [place] 02/10/21), CKD 3 stage who was admitted on 03/02/21 with hypotension, dizziness, near syncope and hgb 5.9. He received 2 units PRBC. Fecal occult was positive. Dr. Silverio Decamp with GI was consulted who recommended conservative management of NPO, IV PPI and transfusion as needed as heme positive stools likely secondary to NGT mucosal trauma. Eliquis was held. He was sent to CIR for functional decline. SLP followed during stay; he had MBS which revealed mod-severe dysphagia; G-tube for nutrition. He has been NPO but is participating in pharyngeal strengthening  exercises, RMT and trials of ice chips/thin liquids.    Assessment / Plan / Recommendation  Clinical Impression  Pt has a hx of dysphagia, for which he has been followed extensively on Inpatient Rehab and was seen as recently as yesterday. MBS on 1/31 revealed severe dysphagia - review of imaging reveals profound deficits.  He has been NPO, working on pharyngeal strengthening with SLP.  Today, he was alert and friendly; unable to speak to dysphagia therapy and exercises.  He accepted ice chips with active mastication, palpable swallow (which was subjectively weak), and no overt s/sx of dysphagia - no further POs offer given severity of deficits.  Recommend that nursing continue to offer ice chips for pleasure and to stave off disuse atrophy; continue oral hygiene..  WIll defer cognitive assessment.  No acute care needs are identified from an SLP perspective. Our service will sign off. D/W teaching services physician.  SLP Visit Diagnosis: Dysphagia, oropharyngeal phase (R13.12)    Aspiration Risk  Severe aspiration risk    Diet Recommendation   NPO except ice chips for pleasure  Medication Administration: Via alternative means    Other  Recommendations Oral Care Recommendations: Oral care QID    Recommendations for follow up therapy are one component of a multi-disciplinary discharge planning process, led by the attending physician.  Recommendations may be updated based on patient status, additional functional criteria and insurance authorization.  Follow up Recommendations Acute inpatient rehab (3hours/day)  Swallow Study   General HPI: Makya Yurko is a 88 y.o. male who was D/Cd from inpt rehab to acute care due to new dx COVID 19 with fever. Prior medical history significant for A Fib s/p ablation, CHB s/p PPM, HTN,  HLD, hypothyroidism, neuropathy, lung nodule, TVAR, dysphagia and malnutrition s/p G tube (placed in [place] 02/10/21), CKD 3 stage who was admitted on 03/02/21  with hypotension, dizziness, near syncope and hgb 5.9. He received 2 units PRBC. Fecal occult was positive. Dr. Silverio Decamp with GI was consulted who recommended conservative management of NPO, IV PPI and transfusion as needed as heme positive stools likely secondary to NGT mucosal trauma. Eliquis was held. He was sent to CIR for functional decline. SLP followed during stay; he had MBS which revealed mod-severe dysphagia; G-tube for nutrition. He has been NPO but is participating in pharyngeal strengthening exercises, RMT and trials of ice chips/thin liquids. Type of Study: Bedside Swallow Evaluation Previous Swallow Assessment: see HPI Diet Prior to this Study: NPO Temperature Spikes Noted: Yes Respiratory Status: Nasal cannula History of Recent Intubation: No Behavior/Cognition: Alert;Cooperative;Pleasant mood Oral Cavity Assessment: Within Functional Limits Oral Care Completed by SLP: Recent completion by staff Oral Cavity - Dentition: Adequate natural dentition Patient Positioning: Upright in bed Baseline Vocal Quality: Low vocal intensity Volitional Cough: Strong Volitional Swallow: Able to elicit    Oral/Motor/Sensory Function Overall Oral Motor/Sensory Function: Within functional limits   Ice Chips Ice chips: Within functional limits   Thin Liquid Thin Liquid: Not tested    Nectar Thick Nectar Thick Liquid: Not tested   Honey Thick Honey Thick Liquid: Not tested   Puree Puree: Not tested   Solid     Solid: Not tested      Juan Quam Laurice 04/01/2021,11:04 AM  Estill Bamberg L. Tivis Ringer, [place] Office number [phone] Pager [phone]

## 2021-04-01 NOTE — Progress Notes (Signed)
Palliative Care Progress Note, Assessment & Plan   Patient Name: Gregory [name]       Date: 04/01/2021 DOB: [birth year]  Age: 88 y.o. MRN#: [id number] Attending Physician: Lucious Groves, DO Primary Care Physician: Orpah Greek, DO Admit Date: 03/31/2021  Reason for Consultation/Follow-up: Establishing goals of care  Subjective: Patient was lying in bed in no apparent distress.  He is able to make his wishes known and acknowledges my presence as I enter.  He has a nonproductive cough but denies any issues or complaints at this time.  HPI: Patient is a 88 year old male with a past medical history significant for a flutter status post TAVR with ablation and PPM, HLD, hypothyroidism, hypertension, neuropathy, lung nodule, dysphagia with PEG in place, and HOH admitted on 1/27 with hypotension, dizziness and a near syncopal event with a hemoglobin of 5.9.  Patient had significant functional decline and was admitted to CIR for rehab.  Patient developed persistent cough and respiratory panel revealed COVID-positive.  Patient transferred back to inpatient status.  Summary of counseling/coordination of care: After reviewing the patient's chart and assessing the patient at bedside, I spoke with patient's about his current health status.  I shared that while we are focusing on his functional, nutritional, and congitive status, there is also a mental and spiritual piece to his medical treatment. Therapeutic silence and active listening provided.  Patient shared he says he feels fine but is just tired of being in the hospital.  He states he is ready to go home.  I discussed that with this new onset of COVID-19 he would likely remain with inpatient status at the hospital for at least the next 10 days.  He seems  surprised and let down by this. He shares he did not realize it took that long for "this stuff to go away".   I attempted to elicit goals important to the patient.  I outlined that he has been in the hospital for a month now.  He seems surprised that it was this long but shared he knew he had been here for a while.  He says he wants to keep on living but is just tired of being in the hospital.  He is ready to go home and continually repeats that throughout our discussion. I shared that in addition to his Covid+, he also has functional decline that will continue to be addressed. He reports he doesn't like laying in the bed but also is tired.   Patient then again asked how long he would be in the hospital and how long the virus would last.  Though I had to repeat answers often I believe he still has capacity to understand what is going on with his health.   Questions and concerns were addressed. PMT will continue to follow throughout this hospitalization.  Code Status: Full code  Prognosis: Unable to determine  Discharge Planning: To Be Determined  Care plan was discussed with patient, PCT, nursing  Physical Exam Constitutional:      General: He is not in acute distress.    Appearance: Normal appearance. He is not toxic-appearing.  HENT:     Head: Normocephalic and atraumatic.     Mouth/Throat:  Mouth: Mucous membranes are moist.  Eyes:     Pupils: Pupils are equal, round, and reactive to light.  Cardiovascular:     Rate and Rhythm: Normal rate.     Pulses: Normal pulses.  Pulmonary:     Effort: Pulmonary effort is normal.  Musculoskeletal:     Cervical back: Normal range of motion.     Comments: Generalized weakness  Skin:    General: Skin is warm and dry.  Neurological:     Mental Status: He is alert and oriented to person, place, and time. Mental status is at baseline.  Psychiatric:        Mood and Affect: Mood normal.        Behavior: Behavior normal.        Thought  Content: Thought content normal.        Judgment: Judgment normal.            Palliative Assessment/Data: 40%    Total Time 25 minutes  Greater than 50%  of this time was spent counseling and coordinating care related to the above assessment and plan.  Thank you for allowing the Palliative Medicine Team to assist in the care of this patient.  [place] Ilsa Iha, FNP-BC Palliative Medicine Team Team Phone # [phone]

## 2021-04-02 DIAGNOSIS — R1310 Dysphagia, unspecified: Secondary | ICD-10-CM | POA: Diagnosis not present

## 2021-04-02 DIAGNOSIS — I482 Chronic atrial fibrillation, unspecified: Secondary | ICD-10-CM | POA: Diagnosis not present

## 2021-04-02 DIAGNOSIS — Z931 Gastrostomy status: Secondary | ICD-10-CM | POA: Diagnosis not present

## 2021-04-02 DIAGNOSIS — U071 COVID-19: Secondary | ICD-10-CM | POA: Diagnosis not present

## 2021-04-02 LAB — GLUCOSE, CAPILLARY
Glucose-Capillary: 116 mg/dL — ABNORMAL HIGH (ref 70–99)
Glucose-Capillary: 117 mg/dL — ABNORMAL HIGH (ref 70–99)
Glucose-Capillary: 134 mg/dL — ABNORMAL HIGH (ref 70–99)
Glucose-Capillary: 136 mg/dL — ABNORMAL HIGH (ref 70–99)

## 2021-04-02 LAB — URINE CULTURE: Culture: NO GROWTH

## 2021-04-02 LAB — D-DIMER, QUANTITATIVE: D-Dimer, Quant: 0.88 ug/mL-FEU — ABNORMAL HIGH (ref 0.00–0.50)

## 2021-04-02 LAB — CBC
HCT: 26.9 % — ABNORMAL LOW (ref 39.0–52.0)
Hemoglobin: 8.8 g/dL — ABNORMAL LOW (ref 13.0–17.0)
MCH: 31.3 pg (ref 26.0–34.0)
MCHC: 32.7 g/dL (ref 30.0–36.0)
MCV: 95.7 fL (ref 80.0–100.0)
Platelets: 161 10*3/uL (ref 150–400)
RBC: 2.81 MIL/uL — ABNORMAL LOW (ref 4.22–5.81)
RDW: 17.2 % — ABNORMAL HIGH (ref 11.5–15.5)
WBC: 6.6 10*3/uL (ref 4.0–10.5)
nRBC: 0 % (ref 0.0–0.2)

## 2021-04-02 LAB — COMPREHENSIVE METABOLIC PANEL
ALT: 29 U/L (ref 0–44)
AST: 38 U/L (ref 15–41)
Albumin: 3.2 g/dL — ABNORMAL LOW (ref 3.5–5.0)
Alkaline Phosphatase: 61 U/L (ref 38–126)
Anion gap: 11 (ref 5–15)
BUN: 59 mg/dL — ABNORMAL HIGH (ref 8–23)
CO2: 27 mmol/L (ref 22–32)
Calcium: 9.3 mg/dL (ref 8.9–10.3)
Chloride: 97 mmol/L — ABNORMAL LOW (ref 98–111)
Creatinine, Ser: 1.35 mg/dL — ABNORMAL HIGH (ref 0.61–1.24)
GFR, Estimated: 47 mL/min — ABNORMAL LOW (ref >60)
Glucose, Bld: 130 mg/dL — ABNORMAL HIGH (ref 70–99)
Potassium: 3.6 mmol/L (ref 3.5–5.1)
Sodium: 135 mmol/L (ref 135–145)
Total Bilirubin: 0.3 mg/dL (ref 0.3–1.2)
Total Protein: 6.8 g/dL (ref 6.5–8.1)

## 2021-04-02 LAB — PHOSPHORUS: Phosphorus: 3.4 mg/dL (ref 2.5–4.6)

## 2021-04-02 LAB — C-REACTIVE PROTEIN: CRP: 4.5 mg/dL — ABNORMAL HIGH (ref <1.0)

## 2021-04-02 LAB — FERRITIN: Ferritin: 1363 ng/mL — ABNORMAL HIGH (ref 24–336)

## 2021-04-02 LAB — MAGNESIUM: Magnesium: 2.2 mg/dL (ref 1.7–2.4)

## 2021-04-02 NOTE — TOC Progression Note (Signed)
Transition of Care [facility]) - Initial/Assessment Note    Patient Details  Name: Gregory [name] MRN: [id number] Date of Birth: [birth year]  [facility]) CM/SW Contact:    Milinda Antis, LCSWA Phone Number: 04/02/2021, 2:53 PM  Clinical Narrative:                  CSW contacted Mickel Baas with CIR.  Inpatient rehab will discuss the patient tomorrow with their social work team.   Patient Goals and CMS Choice        Expected Discharge Plan and Services           Expected Discharge Date: 03/31/21                                    Prior Living Arrangements/Services                       Activities of Daily Living Home Assistive Devices/Equipment: Other (Comment) (tube feeding supplies) ADL Screening (condition at time of admission) Patient's cognitive ability adequate to safely complete daily activities?: No Is the patient deaf or have difficulty hearing?: Yes Does the patient have difficulty seeing, even when wearing glasses/contacts?: Yes Does the patient have difficulty concentrating, remembering, or making decisions?: Yes Patient able to express need for assistance with ADLs?: No Does the patient have difficulty dressing or bathing?: Yes Independently performs ADLs?: No Communication: Independent Is this a change from baseline?: Pre-admission baseline Dressing (OT): Needs assistance Is this a change from baseline?: Pre-admission baseline Grooming: Needs assistance Is this a change from baseline?: Pre-admission baseline Feeding: Needs assistance Is this a change from baseline?: Pre-admission baseline Bathing: Needs assistance Is this a change from baseline?: Pre-admission baseline Toileting: Needs assistance Is this a change from baseline?: Pre-admission baseline In/Out Bed: Needs assistance Is this a change from baseline?: Pre-admission baseline Walks in Home: Needs assistance Is this a change from baseline?: Pre-admission baseline Does  the patient have difficulty walking or climbing stairs?: Yes Weakness of Legs: Both Weakness of Arms/Hands: Both  Permission Sought/Granted                  Emotional Assessment              Admission diagnosis:  COVID-19 [U07.1] Patient Active Problem List   Diagnosis Date Noted   COVID-19 04/01/2021   GIB (gastrointestinal bleeding) 03/31/2021   Malaise and fatigue 03/31/2021   Cough, unspecified 03/31/2021   UTI (urinary tract infection) 03/31/2021   COVID-19 virus infection 03/31/2021   Acute on chronic anemia with recent GI bleed 03/31/2021   Complete heart block s/p PPM  03/31/2021   Fever 03/31/2021   Hypothyroidism 03/31/2021   Debility 03/10/2021   Protein-calorie malnutrition, severe 03/07/2021   Symptomatic anemia 03/02/2021   Dysphagia s/p G tube  03/02/2021   Protein malnutrition ([place]) 03/02/2021   CRI (chronic renal insufficiency), stage 3 (moderate) ([place]) 02/01/2020   Dizziness 02/01/2020   S/P TAVR (transcatheter aortic valve replacement) 10/15/2014   Bradycardia 09/08/2013   Cardiac pacemaker in situ 09/08/2013   Inguinal hernia without mention of obstruction or gangrene, unilateral or unspecified, (not specified as recurrent)-left 08/11/2013   Chronic atrial fibrillation s/p ablation  08/11/2013   PCP:  Orpah Greek, DO Pharmacy:   [id number] [name], [place] [address] [place] [address] Phone: [phone] Fax: [phone]  Social Determinants of Health (SDOH) Interventions    Readmission Risk Interventions Readmission Risk Prevention Plan 03/10/2021  Transportation Screening Complete  PCP or Specialist Appt within 5-7 Days Complete  Home Care Screening Complete  Medication Review (RN CM) Complete  Some recent data might be hidden

## 2021-04-02 NOTE — Progress Notes (Signed)
Subjective:   Patient noted to have some O2 desaturations charted but states he has not felt short of breath. He is eager to get back to CIR.   He denies any chest pain.   Objective:  Vital signs in last 24 hours: Vitals:   04/01/21 2332 04/02/21 0400 04/02/21 0745 04/02/21 1101  BP: (!) 142/69 (!) 152/70 (!) 165/84 (!) 143/65  Pulse: 70 70 71 70  Resp: 19 (!) 23 19 16   Temp: 98 F (36.7 C) 98.4 F (36.9 C) 98.2 F (36.8 C) 98 F (36.7 C)  TempSrc: Oral Oral Oral Oral  SpO2: 91% 93% 95% 96%  Weight:      Height:       Physical Exam  Constitutional: chronically ill appearing and in no distress.  HENT:  Head: Normocephalic and atraumatic.  Eyes: EOM are normal.  Neck: Normal range of motion.  Cardiovascular: V-paced (same as yesterday, exam 2/18 should have noted this). No lower extremity edema  Pulmonary: Non labored breathing on room air, no wheezing or rales  Abdominal: Soft. Normal bowel sounds. Non distended and non tender Musculoskeletal: Decreased bulk       General: No tenderness or edema.  Neurological: Alert and oriented to person, place, and time.  Skin: Skin is warm and dry.    Assessment/Plan:  Principal Problem:   COVID-19 Active Problems:   Chronic atrial fibrillation s/p ablation    S/P TAVR (transcatheter aortic valve replacement)   CRI (chronic renal insufficiency), stage 3 (moderate) (HCC)   Dysphagia s/p G tube    Protein-calorie malnutrition, severe   Debility   COVID-19 virus infection   Acute on chronic anemia with recent GI bleed   Complete heart block s/p PPM    Fever   Hypothyroidism  Gregory [name] is a 88 y.o. male with medical history significant of  A Fib s/p ablation, CHB s/p PPM, HTN,  HLD, hypothyroidism, neuropathy, lung nodule, TAVR, dysphagia and malnutrition s/p G tube (placed in [place] 02/10/21), CKD 3 stage h/w fever, cough, and weakness found to be COVID 19 positive.    #COVID-19 infection #Hypoxemia  Patient  presented from [facility] after having a fever up to 102 F, cough, and weakness.  He was found to be COVID-19 positive.  His last fever was 1531 2/17up to 101.50F. He is saturating well on room air, though some desaturations have been noted.  He has no leukocytosis. He has elevated inflammatory markers and his procalcitonin is negative. He is on decadron for this.   Of note patient treated for COVID 19 infection in 07/2020 at [facility] with remdesivir and steroids. He is fully vaccinated and has received his booster x 3.  -Continue decadron -Aggressive pulmonary toilet  #Dysphagia s/p G tube Unclear etiology of his dysphagia. PEG tube placed at [facility] in [facility] 02/10/2021. SLP re-evaluated patient this AM and noted that patient should have ice chips for comfort only and be NPO all other times. He is currently receiving bolus Tfs via his PEG. Continued aspiration risk.   #Anemia d/t ACD and GIB He was admitted to [facility] 1/10 for symptomatic anemia and found to have Hgb to 5.9. GI was consulted and conservative management with IVF and transfusions was recommended in discussions with patient's family over EGD. His eliquis was held as well. His hgb remains stable.  -Transfuse for hgb<7 -Continue IV protonix BID, carafate, and Fe supplementation.   #Atrial flutter/fib s/p ablation  #CHB s/p PPM  On telemetry, ekg on admission  with ventricul paced rhythm. Per cardiology consult note from 03/10/2021. Patient's family decided along with cardiology that eliquis should be held.  -Continue to hold eliquis   #CKD stage 3 BL sCr previously thought to be around 1.1-1.2 maybe 1.3-1.5 at this time. Around 1.5 this AM.  -CTM  -Avoid nephrotoxic agents -Receiving nutrition and free water via his PEG tube.   #Hypothyroidism Continue with home synthroid   #S/P TAVR (transcatheter aortic valve replacement) 10/2018: TAVR with a #26 Sapien S3 Ultra valve in [place], [place].  Echo 10/20: EF 62% with slight increase in prosthetic  aortic velocity [1]   Advance Care Planning:   Code Status: Full Code  Consults: none  DVT Prophylaxis: SCDs, lovenox  Anticipated Discharge Location: CIR  Barriers to Discharge: None  Dispo: Anticipated discharge in approximately 2 day(s).   Rick Duff, MD 04/02/2021, 2:26 PM Rick Duff, MD PGY-2 Internal Medicine  Pager [phone]  After 5pm on weekdays and 1pm on weekends: On Call pager [phone]

## 2021-04-02 NOTE — Progress Notes (Signed)
Occupational Therapy Note  Patient Details  Name: Gregory [name] MRN: [id number] Date of Birth: [birth year]   Occupational Therapy Discharge Note  This patient was unable to complete the inpatient rehab program due to decline in medical status and transfer to acute; therefore did not meet their long term goals. Pt left the program at a max A/tot A assist level for their  functional ADLs. This patient is being discharged from OT services at this time.  BIMS at time of d/c  Pt unable to complete due to medical status  See CareTool for functional status details.  If the patient is able to return to inpatient rehabilitation within 3 midnights, this may be considered an interrupted stay and therapy services will resume as ordered. Modification and reinstatement of their goals will be made upon completion of therapy service reevaluations.      Leotis Shames [facility] 04/02/2021, 8:41 AM

## 2021-04-02 NOTE — Plan of Care (Signed)

## 2021-04-02 NOTE — Progress Notes (Addendum)
Subjective:  Overnight events: Nose bleed after picking scab in right nostril, and then coughed up small amount of blood. Evaluated at bedside; HDS. R nostril packed with gauze. No ongoing blood loss reported.    Seen at bedside during rounds. He reports feeling well. He denies SHOB and CP. No further blood loss. Eager to go back to CIR.    Objective:  Vital signs in last 24 hours: Vitals:   04/02/21 0400 04/02/21 0745 04/02/21 1101 04/02/21 1600  BP: (!) 152/70 (!) 165/84 (!) 143/65 (!) 135/58  Pulse: 70 71 70 69  Resp: (!) 23 19 16 20   Temp: 98.4 F (36.9 C) 98.2 F (36.8 C) 98 F (36.7 C) 97.9 F (36.6 C)  TempSrc: Oral Oral Oral Oral  SpO2: 93% 95% 96% 93%  Weight:      Height:       Constitutional: chronically ill appearing and in no distress  Head: Normocephalic and atraumatic Eyes: EOM are normal.  Neck: Normal range of motion  Cardiovascular: V-paced. No LE edema   Pulmonary: Non labored breathing on room air, LCTAB  Abdominal: Soft. Normal bowel sounds. Non distended and non tender.  Musculoskeletal: Decreased bulk    Neurological: Alert and oriented to person, place, and time.  Skin: Skin is warm and dry.    Assessment/Plan:  Principal Problem:   COVID-19 Active Problems:   Chronic atrial fibrillation s/p ablation    S/P TAVR (transcatheter aortic valve replacement)   CRI (chronic renal insufficiency), stage 3 (moderate) (HCC)   Dysphagia s/p G tube    Protein-calorie malnutrition, severe   Debility   COVID-19 virus infection   Acute on chronic anemia with recent GI bleed   Complete heart block s/p PPM    Fever   Hypothyroidism  Mr [place], 88 y.o. male with PMHx of A Fib s/p ablation, CHB s/p PPM, HTN,  HLD, hypothyroidism, dysphagia and malnutrition s/p G tube (placed in [place] 02/10/21), CKD 3 stage transferred from CIR for COVID infection.   COVID-19 infection Presenting from CIR as COVID+ with fever 102 F, cough, and weakness. Fully  vaccinated and booster x 3.Treated for COVID infection in 07/2020 at [facility] with remdesivir and steroids.   Currently afebrile, saturating well on RA with some desaturations to low 90's. Inflammatory markers trending down, with negative procalcitonin. Will d/c decadron now. Pt is stable for discharge from our service at this time. PT/OT recommending CIR, and pt's son also preferring CIR given he is not mobile with walker yet. Spoke with CIR coordinator, they will accept pt once he is off of COVID precautions, pending bed. Anticipate DC to CIR on 2/22.  -Discontinue decadron -Discontinue following inflammatory markers  -PT/OT following   Hospital delirium Paged by RN that immediately after rounds this AM, pt complained of squirrels running around his room to his son. Son reports that this has never happened before. Sounds like hospital delirium in this 88 yo pt who has been in the hospital setting for >1 mo. I think it will be important to get him home vs back to CIR and establish routine, given he is at risk for decline.   Dysphagia s/p G tube 01/2021 SLP re-eval this admission: ice chips for comfort only and be NPO all other times.   -Continue feeds via PEG  Anemia d/t ACD and GIB Eliquis on hold. Hgb stable this admission. Had small amt of blood loss overnight d/t dry nose but no further loss and HDS.  -Transfuse  for hgb<7  -Continue IV protonix BID, carafate, and Fe supplementation.   Atrial flutter/fib s/p ablation  CHB s/p PPM  Per cardiology consult note from 03/10/2021, pt's family decided along with cardiology that eliquis should be held.  -Continue to hold eliquis   CKD stage 3 Stable. Cr 1.1 (baseline 1.1-1.2) -CTM  -Avoid nephrotoxic agents -Receiving nutrition and free water via his PEG tube   Hypothyroidism -Continue home synthroid    Diet: NPO VTE: Levenox 30 Fluids: Free water with PEG  Code: Full  Lajean Manes, MD  Internal Medicine Resident, PGY-1 [facility]  Internal Medicine Residency  Pager: [phone]

## 2021-04-03 DIAGNOSIS — U071 COVID-19: Secondary | ICD-10-CM | POA: Diagnosis not present

## 2021-04-03 LAB — COMPREHENSIVE METABOLIC PANEL
ALT: 27 U/L (ref 0–44)
AST: 40 U/L (ref 15–41)
Albumin: 3 g/dL — ABNORMAL LOW (ref 3.5–5.0)
Alkaline Phosphatase: 51 U/L (ref 38–126)
Anion gap: 11 (ref 5–15)
BUN: 50 mg/dL — ABNORMAL HIGH (ref 8–23)
CO2: 25 mmol/L (ref 22–32)
Calcium: 8.8 mg/dL — ABNORMAL LOW (ref 8.9–10.3)
Chloride: 100 mmol/L (ref 98–111)
Creatinine, Ser: 1.1 mg/dL (ref 0.61–1.24)
GFR, Estimated: >60 mL/min (ref >60)
Glucose, Bld: 125 mg/dL — ABNORMAL HIGH (ref 70–99)
Potassium: 3.8 mmol/L (ref 3.5–5.1)
Sodium: 136 mmol/L (ref 135–145)
Total Bilirubin: 0.6 mg/dL (ref 0.3–1.2)
Total Protein: 6.2 g/dL — ABNORMAL LOW (ref 6.5–8.1)

## 2021-04-03 LAB — GLUCOSE, CAPILLARY: Glucose-Capillary: 103 mg/dL — ABNORMAL HIGH (ref 70–99)

## 2021-04-03 LAB — PHOSPHORUS: Phosphorus: 2.7 mg/dL (ref 2.5–4.6)

## 2021-04-03 LAB — OCCULT BLOOD X 1 CARD TO LAB, STOOL: Fecal Occult Bld: NEGATIVE

## 2021-04-03 LAB — D-DIMER, QUANTITATIVE: D-Dimer, Quant: 0.77 ug/mL-FEU — ABNORMAL HIGH (ref 0.00–0.50)

## 2021-04-03 LAB — C-REACTIVE PROTEIN: CRP: 2.4 mg/dL — ABNORMAL HIGH (ref <1.0)

## 2021-04-03 LAB — MAGNESIUM: Magnesium: 1.9 mg/dL (ref 1.7–2.4)

## 2021-04-03 LAB — FERRITIN: Ferritin: 1190 ng/mL — ABNORMAL HIGH (ref 24–336)

## 2021-04-03 NOTE — Progress Notes (Signed)
Received page from RN for pt picking a scab in his R nostril causing nose bleed, coughing up some blood. Pt eval at bedside, VSS. Nose repacked with gauze and advised RN to page me again if bleeding does not stop.

## 2021-04-03 NOTE — PMR Pre-admission (Signed)
PMR Admission Coordinator Pre-Admission Assessment  Patient: Gregory [name] is an 88 y.o., male MRN: [id number] DOB: 1920/06/11 Height: '5\' 8"'  (172.7 cm) Weight:  (unable to weigh in bed)  Insurance Information HMO:     PPO:      PCP:      IPA:      80/20:      OTHER:  PRIMARY: Medicare       Policy#:   [id number]    Subscriber: Pt. Phone#: Verified online    Fax#:  Pre-Cert#:       Employer:  Benefits:  Phone #:      Name:  Eff. Date: Parts A ad B effective  09/13/87 Deduct: $1600      Out of Pocket Max:  None      Life Max: N/A  CIR: 100%      SNF: 100 days Outpatient: 80%     Co-Pay: 20% Home Health: 100%      Co-Pay: none DME: 80%     Co-Pay: 20% Providers: patient's choice Providers: in network SECONDARY: [place]      Policy#: [id number]     Phone#:               Financial Counselor:       Phone#:   The Actuary for patients in Inpatient Rehabilitation Facilities with attached Privacy Act [facility] Records was provided and verbally reviewed with: Patient and Family  Emergency Contact Information Contact Information     Name Relation Home Work Mobile   [place] Daughter   [phone]   Kirke, Breach   [phone]       Current Medical History  Patient Admitting Diagnosis: GI Bleed, [place], COVID 19  History of Present Illness: Gregory [name] is a 88 year old person with history of aortic stenosis status post prosthetic valve replacement in 2020, paroxysmal A. fib on Eliquis, complete heart block status post permanent pacemaker implantation, oropharyngeal dysphagia status post PEG tube placement in December 2022, hyperlipidemia, hypothyroidism, and prior COVID infection in June 2022 who presented  to the ED 03/02/20 for complaints of dizziness and near syncope.   Patient recently hospitalized at the [facility] in [place] for PEG placement secondary to dysphagia (pt. Had a choking episode at GI office, swallow study  performed at [facility] revealed no aspiration) in December.  No p.o. intake since that time.  During hospitalization for PEG tube placement appears that hemoglobin dropped below 7 several times requiring blood transfusions.  Patient denies dark, black, or bloody stools although notes that he has not had a bowel movement in about 2 weeks since discharge from previous hospitalization until yesterday when he had 2 movements.  On initial evaluation in the emergency department patient was hypotensive with a MAP as low as 53.  Heart rate was in the 70s.  Tachypneic although on room air.  Patient received 2 L IV fluids with improvement in blood pressure.  Labs notable for hemoglobin of 5.9, mild leukocytosis to 12.3, and normal platelets.  Patient received 2 units of blood.  GI was consulted and recommended IV PPI, n.p.o. and transfusions for hemoglobin goal greater than 7.  GI team concerned about patient's high risk for undergoing anesthesia or endoscopic procedures at this time. Overnight patient continued to be intermittently hypotensive.  Received an additional 1 L of IV fluids and 1 unit of blood for hemoglobin of 6.9.  Ferritin elevated, B12 folate normal.  Hypoproliferative response on reticulocyte index. GI consulted and felt  that had an UGI bleed, but recommended against anaesthesia and endoscopic procedures due to advanced age and low Hgb. Pt. Received multiple units of PRBCs and CIR was consulted to assist return to PLOF. Pt. Was admitted to [facility] 03/10/2021 where he worked with therapies and was nearing his discharge date. He began having respiratory symptoms and tested positive for COVID 03/31/21. He was transferred to acute hospital for medical management. Pt. Was stabilized on room air and seen by PT/OT/SLP to recommended return to CIR.     Patient's medical record from [facility]  has been reviewed by the rehabilitation admission coordinator and physician.  Past Medical History  Past Medical  History:  Diagnosis Date   Atrial flutter [facility])    s/p ablation   Cancer [facility])    skin cancer   Cataracts, bilateral    Hypertension    Neuromuscular disorder [facility])    Pacemaker 2010   Thyroid disease     Has the patient had major surgery during 100 days prior to admission? Yes  Family History   family history includes Cancer in his father; Heart disease in his mother.  Current Medications  Current Facility-Administered Medications:    acetaminophen (TYLENOL) tablet 325-650 mg, 325-650 mg, Per Tube, Q4H PRN, Orma Flaming, MD, 650 mg at 03/31/21 2329   alum & mag hydroxide-simeth (MAALOX/MYLANTA) 200-200-20 MG/5ML suspension 30 mL, 30 mL, Per Tube, Q4H PRN, Orma Flaming, MD   atorvastatin (LIPITOR) tablet 40 mg, 40 mg, Per Tube, Gerri Lins, MD, 40 mg at 04/02/21 2043   bisacodyl (DULCOLAX) suppository 10 mg, 10 mg, Rectal, Daily PRN, Orma Flaming, MD   chlorhexidine (PERIDEX) 0.12 % solution 15 mL, 15 mL, Mouth Rinse, BID, Orma Flaming, MD, 15 mL at 04/03/21 0945   diphenhydrAMINE (BENADRYL) 12.5 MG/5ML elixir 12.5-25 mg, 12.5-25 mg, Per Tube, Q6H PRN, Orma Flaming, MD   enoxaparin (LOVENOX) injection 30 mg, 30 mg, Subcutaneous, Q24H, Rick Duff, MD, 30 mg at 04/03/21 0946   feeding supplement (NEPRO CARB STEADY) liquid 315 mL, 315 mL, Per Tube, TID, Lucious Groves, DO, Last Rate: 315 mL/hr at 04/03/21 0946, 315 mL at 04/03/21 1316   ferrous sulfate 220 (44 Fe) MG/5ML solution 220 mg, 220 mg, Per Tube, BID WC, Orma Flaming, MD, 220 mg at 04/03/21 0945   free water 300 mL, 300 mL, Per Tube, QID, Hoffman, Erik C, DO, 300 mL at 04/03/21 1316   guaiFENesin-dextromethorphan (ROBITUSSIN DM) 100-10 MG/5ML syrup 5-10 mL, 5-10 mL, Per Tube, Q6H PRN, Orma Flaming, MD, 5 mL at 04/01/21 0533   levothyroxine (SYNTHROID) tablet 75 mcg, 75 mcg, Per Tube, Q0600, Orma Flaming, MD, 75 mcg at 04/03/21 2993   lip balm (CARMEX) ointment, , Topical, PRN, Orma Flaming,  MD   MEDLINE mouth rinse, 15 mL, Mouth Rinse, QID, Orma Flaming, MD, 15 mL at 04/03/21 1331   pantoprazole sodium (PROTONIX) 40 mg/20 mL oral suspension 40 mg, 40 mg, Per Tube, BID AC, Orma Flaming, MD, 40 mg at 04/03/21 0945   phosphorus (K PHOS NEUTRAL) tablet 250 mg, 250 mg, Per Tube, Daily, Orma Flaming, MD, 250 mg at 04/03/21 0945   polyethylene glycol (MIRALAX / GLYCOLAX) packet 17 g, 17 g, Per Tube, Daily PRN, Orma Flaming, MD   prochlorperazine (COMPAZINE) tablet 5-10 mg, 5-10 mg, Per Tube, Q6H PRN **OR** prochlorperazine (COMPAZINE) injection 5-10 mg, 5-10 mg, Intramuscular, Q6H PRN **OR** prochlorperazine (COMPAZINE) suppository 12.5 mg, 12.5 mg, Rectal, Q6H PRN, Orma Flaming, MD   senna-docusate (  Senokot-S) tablet 2 tablet, 2 tablet, Per Tube, Gerri Lins, MD, 2 tablet at 04/02/21 2043   sucralfate (CARAFATE) 1 GM/10ML suspension 1 g, 1 g, Per Tube, Q8H, Orma Flaming, MD, 1 g at 04/03/21 1316   traZODone (DESYREL) tablet 25-50 mg, 25-50 mg, Per Tube, QHS PRN, Orma Flaming, MD, 50 mg at 03/31/21 2329  Patients Current Diet:  Diet Order             Diet NPO time specified Except for: Ice Chips  Diet effective now                   Precautions / Restrictions Precautions Precautions: Fall Precaution Comments: NPO with PEG tube, posterior lean Restrictions Weight Bearing Restrictions: No   Has the patient had 2 or more falls or a fall with injury in the past year? Yes  Prior Activity Level Community (5-7x/wk): Pt. went out often  Prior Functional Level Self Care: Did the patient need help bathing, dressing, using the toilet or eating? Independent  Indoor Mobility: Did the patient need assistance with walking from room to room (with or without device)? Independent  Stairs: Did the patient need assistance with internal or external stairs (with or without device)? Independent  Functional Cognition: Did the patient need help planning regular tasks  such as shopping or remembering to take medications? Independent  Patient Information Are you of Hispanic, Latino/a,or Spanish origin?: A. No, not of Hispanic, Latino/a, or Spanish origin What is your race?: A. White Do you need or want an interpreter to communicate with a doctor or health care staff?: 0. No  Patient's Response To:  Health Literacy and Transportation Is the patient able to respond to health literacy and transportation needs?: Yes Health Literacy - How often do you need to have someone help you when you read instructions, pamphlets, or other written material from your doctor or pharmacy?: Sometimes In the past 12 months, has lack of transportation kept you from medical appointments or from getting medications?: No In the past 12 months, has lack of transportation kept you from meetings, work, or from getting things needed for daily living?: No  Development worker, international aid / [place] Devices/Equipment: Other (Comment) (tube feeding supplies) Home Equipment: Cane - single point, Grab bars - tub/shower, Grab bars - toilet, Hand held shower head, Shower seat  Prior Device Use: Indicate devices/aids used by the patient prior to current illness, exacerbation or injury? Walker  Current Functional Level Cognition  Overall Cognitive Status: Within Functional Limits for tasks assessed Orientation Level: Disoriented to situation, Disoriented to time, Disoriented to place General Comments: WFL for simple tasks, oriented to self, month, year, location, not situation    Extremity Assessment (includes Sensation/Coordination)  Upper Extremity Assessment: Generalized weakness  Lower Extremity Assessment: Generalized weakness    ADLs  Overall ADL's : Needs assistance/impaired Eating/Feeding: NPO Eating/Feeding Details (indicate cue type and reason): except for ice chips after mouth care Grooming: Wash/dry face, Set up, Bed level Upper Body Bathing: Minimal assistance, Bed  level Lower Body Bathing: Total assistance, Bed level Upper Body Dressing : Total assistance, Bed level Lower Body Dressing: Total assistance, Bed level    Mobility  Overal bed mobility: Needs Assistance Bed Mobility: Rolling, Sidelying to Sit, Sit to Sidelying Rolling: Mod assist Sidelying to sit: +2 for physical assistance, Max assist Sit to sidelying: Max assist, +2 for physical assistance General bed mobility comments: increased posterior lean requiring incr assist of 2 people    Transfers  Overall transfer level: Needs assistance Equipment used: 2 person hand held assist Transfers: Sit to/from Stand Sit to Stand: Max assist, +2 physical assistance, From elevated surface General transfer comment: from EOB (elevated) x 2 reps; each with significant posterior lean and feet blocked to prevent sliding; pt denies fear of falling forward    Ambulation / Gait / Stairs / Wheelchair Mobility  Ambulation/Gait Pre-gait activities: unable to clear feet in standing to side step toward [facility]    Posture / Balance Balance Overall balance assessment: Needs assistance Sitting-balance support: No upper extremity supported, Feet supported Sitting balance-Leahy Scale: Poor Postural control: Posterior lean Standing balance support: Bilateral upper extremity supported, During functional activity Standing balance-Leahy Scale: Poor Standing balance comment: Reliant on UE support    Special needs/care consideration Skin Abrasion: sacrum/mid; Blister; heel/left; Ecchymosis: arm/bilateral; Pressure injury: sacrum/mid; heel/right; heel/left; foot/right, lateral; foot/left, lateral; Wound: toe tips of great toes/right,left,bilateral External urinary catheter  and Special service needs PEG, bolus feeds   Previous Home Environment (from acute therapy documentation) Living Arrangements: Children  Lives With: Son Available Help at Discharge: Family, Available 24 hours/day Type of Home: House Home Layout: Two  level, Able to live on main level with bedroom/bathroom Alternate Level Stairs-Rails: Right Home Access: Stairs to enter Entrance Stairs-Rails: None Entrance Stairs-Number of Steps: 2 Bathroom Shower/Tub: Multimedia programmer: Handicapped height Bathroom Accessibility: Yes How Accessible: Accessible via wheelchair [place]: Yes Type of [place]: Homehealth aide Additional Comments: Son lives at home with Pt. and can be with him 24/7  Discharge Living Setting Plans for Discharge Living Setting: Patient's home Type of Home at Discharge: House Discharge Home Layout: Two level, Able to live on main level with bedroom/bathroom Alternate Level Stairs-Rails: Right Alternate Level Stairs-Number of Steps: full flight, chair lift Discharge Home Access: Stairs to enter Entrance Stairs-Rails: None Entrance Stairs-Number of Steps: 2 Discharge Bathroom Shower/Tub: Tub/shower unit Discharge Bathroom Toilet: Handicapped height Discharge Bathroom Accessibility: Yes How Accessible: Accessible via wheelchair Does the patient have any problems obtaining your medications?: No  Social/Family/Support Systems Patient Roles: Other (Comment) Contact Information: [phone] Anticipated Caregiver: Mke (son Ability/Limitations of Caregiver: Can provide mod A Caregiver Availability: 24/7 Discharge Plan Discussed with Primary Caregiver: Yes Is Caregiver In Agreement with Plan?: Yes Does Caregiver/Family have Issues with Lodging/Transportation while Pt is in Rehab?: No  Goals Patient/Family Goal for Rehab: PT/OT/SLP Mod A Expected length of stay: 12-14 days Pt/Family Agrees to Admission and willing to participate: Yes Program Orientation Provided & Reviewed with Pt/Caregiver Including Roles  & Responsibilities: Yes  Decrease burden of Care through IP rehab admission: Specialzed equipment needs  Possible need for SNF placement upon discharge: not anticipated   Patient  Condition: I have reviewed medical records from [facility], spoken with CM, and patient. I met with patient at the bedside for inpatient rehabilitation assessment.  Patient will benefit from ongoing PT, OT, and SLP, can actively participate in 3 hours of therapy a day 5 days of the week, and can make measurable gains during the admission.  Patient will also benefit from the coordinated team approach during an Inpatient Acute Rehabilitation admission.  The patient will receive intensive therapy as well as Rehabilitation physician, nursing, social worker, and care management interventions.  Due to bladder management, bowel management, safety, skin/wound care, disease management, medication administration, pain management, and patient education the patient requires 24 hour a day rehabilitation nursing.  The patient is currently ** with mobility and basic ADLs.  Discharge setting and therapy post discharge at home with home health is anticipated.  Patient has agreed to participate in the Acute Inpatient Rehabilitation Program and will admit today.  Preadmission Screen Completed By:  Genella Mech, 04/03/2021 2:28 PM ______________________________________________________________________   Discussed status with Dr. Ranell Patrick  on 04/05/21 at 1000 and received approval for admission today.  Admission Coordinator:  Danise Edge, time 1012/Date 04/05/21   Assessment/Plan: Diagnosis: Debility Does the need for close, 24 hr/day Medical supervision in concert with the patient's rehab needs make it unreasonable for this patient to be served in a less intensive setting? Yes Co-Morbidities requiring supervision/potential complications: [id number] infection, chronic atrial fibrillation s/p ablation, s/p TAVR, stage 3 chronic kidney disease, dysphagia s/p Gtube Due to bladder management, bowel management, safety, skin/wound care, disease management, medication administration, pain management, and  patient education, does the patient require 24 hr/day rehab nursing? Yes Does the patient require coordinated care of a physician, rehab nurse, PT, OT, and SLP to address physical and functional deficits in the context of the above medical diagnosis(es)? Yes Addressing deficits in the following areas: balance, endurance, locomotion, strength, transferring, bowel/bladder control, bathing, dressing, feeding, grooming, toileting, cognition, swallowing, and psychosocial support Can the patient actively participate in an intensive therapy program of at least 3 hrs of therapy 5 days a week? Yes The potential for patient to make measurable gains while on inpatient rehab is excellent Anticipated functional outcomes upon discharge from inpatient rehab: modA PT, mod assist OT, mod assist SLP Estimated rehab length of stay to reach the above functional goals is: 2 weeks Anticipated discharge destination: Home 10. Overall Rehab/Functional Prognosis: fair   MD Signature: Leeroy Cha, MD

## 2021-04-03 NOTE — Progress Notes (Addendum)
Subjective:  Overnight events: none   Seen at bedside during rounds. He is delirious this morning, refusing IV placement and medications. He is oriented to person and possible time but not place and situation. Son is at bedside, reports that he has never been like this. Son is eager for pt to return to CIR.   Objective:  Vital signs in last 24 hours: Vitals:   04/03/21 0400 04/03/21 0500 04/03/21 0600 04/03/21 0749  BP: 129/64   (!) 148/85  Pulse: 70 71 70 80  Resp: 18 18 20 17   Temp: 100 F (37.8 C)   97.9 F (36.6 C)  TempSrc: Oral   Oral  SpO2: 91% 93% 92% 91%  Weight:      Height:       Constitutional: chronically ill appearing and in no distress  Head: Normocephalic and atraumatic Eyes: EOM are normal.  Neck: Normal range of motion  Cardiovascular: V-paced. No LE edema   Pulmonary: Non labored breathing on room air, LCTAB  Abdominal: Soft. Normal bowel sounds. Non distended, non tender.  Musculoskeletal: Decreased bulk    Neurological: Alert and oriented to person and time, but not place and situation. More delirious compared to yesterday.  Skin: Skin is warm and dry     Assessment/Plan:  Principal Problem:   COVID-19 Active Problems:   Chronic atrial fibrillation s/p ablation    S/P TAVR (transcatheter aortic valve replacement)   CRI (chronic renal insufficiency), stage 3 (moderate) (HCC)   Dysphagia s/p G tube    Protein-calorie malnutrition, severe   Debility   COVID-19 virus infection   Acute on chronic anemia with recent GI bleed   Complete heart block s/p PPM    Fever   Hypothyroidism  Mr Matton, 88 y.o. male with PMHx of A Fib s/p ablation, CHB s/p PPM, HTN,  HLD, hypothyroidism, dysphagia and malnutrition s/p G tube (placed in [place] 02/10/21), CKD 3 stage transferred from CIR for COVID infection.   COVID-19 infection Currently afebrile, saturating well on RA with some desaturations to low 90's. Decadron dc'd after inflammatory markers  trended down, with negative procalcitonin. Pt is stable for discharge from our service at this time, pending transfer to CIR on 2/22 pending lifting COVID precautions.  -No longer on decadron -PT/OT following  -Transfer to CIR 2/22, pending bed   Hospital delirium Worsened hospital delirium today in the setting of new location and abrupt change in his routine. It is possible that 3 days of decadron possibly contributed to this as well; was dc'd yesterday. It is going to be important to transfer to CIR and establish routine.  - Dc'd tele given no concerning findings and to reduce lines - Continue Trazodone 25-50 mg at bedtime prn  - Added scheduled low dose seroquel QHS  Dysphagia s/p G tube 01/2021 SLP re-eval this admission: ice chips for comfort only and be NPO all other times.   -Continue feeds via PEG  Anemia d/t ACD and GIB Eliquis on hold. Hgb was stable near 7.5-8 on admission.  -Transfuse for hgb<7  -Continue IV protonix BID, carafate, and Fe supplementation.   Atrial flutter/fib s/p ablation  CHB s/p PPM  Per cardiology consult note from 03/10/2021, pt's family decided along with cardiology that eliquis should be held.  -Continue to hold eliquis   CKD stage 3 Stable. Cr 1.1 (baseline 1.1-1.2) -CTM  -Avoid nephrotoxic agents -Receiving nutrition and free water via his PEG tube   Hypothyroidism -Continue home synthroid  Diet: NPO VTE: Levenox 30 Fluids: Free water with PEG  Code: Full  Lajean Manes, MD  Internal Medicine Resident, PGY-1 [facility]  Pager: [phone]

## 2021-04-03 NOTE — Progress Notes (Signed)
Inpatient Rehabilitation Discharge Medication Review by a Pharmacist  A complete drug regimen review was completed for this patient to identify any potential clinically significant medication issues.  High Risk Drug Classes Is patient taking? Indication by Medication  Antipsychotic No   Anticoagulant Yes Apixaban- A. flutter   Antibiotic No   Opioid No   Antiplatelet No   Hypoglycemics/insulin No   Vasoactive Medication No   Chemotherapy No   Other Yes Lipitor- HLD Synthroid- hypothyroidism     Type of Medication Issue Identified Description of Issue Recommendation(s)  Drug Interaction(s) (clinically significant)     Duplicate Therapy     Allergy     No Medication Administration End Date     Incorrect Dose     Additional Drug Therapy Needed     Significant med changes from prior encounter (inform family/care partners about these prior to discharge).    Other       Clinically significant medication issues were identified that warrant physician communication and completion of prescribed/recommended actions by midnight of the next day:  No  Name of provider notified for urgent issues identified:   Provider Method of Notification:     Pharmacist comments:   Time spent performing this drug regimen review (minutes):  30   [name] BS, PharmD, BCPS Clinical Pharmacist 04/03/2021 11:12 AM

## 2021-04-03 NOTE — Hospital Course (Addendum)
COVID-19 infection Hypoxemia  Patient presented from [facility] after having a fever up to 102 F, cough, and weakness.  He was found to be COVID-19 positive.  His last fever was 1531 2/17up to 101.69F. He is saturating well on room air, though some desaturations have been noted.  He has no leukocytosis. He has elevated inflammatory markers and his procalcitonin is negative. Started decadron, and discontinued after 3 days once inflammatory markers trended down. Very mild leukocytosis of 10.8 on day of discharge to CIR stable compared to prior day. Do not suspect infection at this time given no systemic symptoms; this is likely to be from decadron use.   PT/OT recommending CIR, and pt's son also preferring CIR given he is not mobile with walker yet. CIR to admit pt.    Hospital delirium Visual hallucinations on day 3 of admission. Likely hospital delirium in this 88 yo pt who has been in the hospital setting for >1 mo. It is also possible that decadron use contributed to delirium. Anticipate improvement after re-establishing routine at CIR.      Dysphagia s/p G tube 01/2021 SLP re-eval this admission: ice chips for comfort only and be NPO all other times.   -Continued feeds via PEG   Anemia d/t ACD and GIB Eliquis on hold. Hgb stable this admission.  -Transfuse for hgb<7  -Continued IV protonix BID, carafate, and Fe supplementation.    Atrial flutter/fib s/p ablation  CHB s/p PPM  Per cardiology consult note from 03/10/2021, pt's family decided along with cardiology that eliquis should be held.  -Continued to hold eliquis    CKD stage 3 Stable. Cr 1.1 (baseline 1.1-1.2) -Avoided nephrotoxic agents -Received nutrition and free water via his PEG tube    Hypothyroidism -Continued home synthroid

## 2021-04-03 NOTE — Progress Notes (Addendum)
Inpatient Rehabilitation Care Coordinator Discharge Note   Patient Details  Name: Gregory [name] MRN: [id number] Date of Birth: 03/25/1920   Discharge location: D/c to acute due to medical concerns  Length of Stay: 21 days  Discharge activity level: Mod to Max A  Home/community participation: Limited  Patient response XK:GYJEHU Literacy - How often do you need to have someone help you when you read instructions, pamphlets, or other written material from your doctor or pharmacy?: Sometimes  Patient response DJ:SHFWYO Isolation - How often do you feel lonely or isolated from those around you?: Patient unable to respond  Services provided included: RD, MD, PT, OT, SLP, RN, CM, TR, Pharmacy, Neuropsych, SW  Financial Services:  Charity fundraiser Utilized: Medicare    Choices offered to/list presented to: Yes  Follow-up services arranged:  DME, Home Health, Other (Comment) ([facility]- to begin at time of d/c.) [place]: [facility] for HHPT/OT/SLP/RN/aide     DME : [place] for hospital bed, and portable suction  Enteral feeds set with up [facility] prior to admission. Need to contact [facility] before his date of discharge to make sure his items are shipped out. Please send home with 5 days worth of enteral feeds. Using [employer] here. This item is out of stock wit [facility] and pt will get [employer] (equivalent) delivered to home.   Patient response to transportation need: Is the patient able to respond to transportation needs?: Yes In the past 12 months, has lack of transportation kept you from medical appointments or from getting medications?: No In the past 12 months, has lack of transportation kept you from meetings, work, or from getting things needed for daily living?: No  Comments (or additional information):  Patient/Family verbalized understanding of follow-up arrangements:  Yes  Individual responsible for coordination of the follow-up plan: contact pt dtr  [name]  Confirmed correct DME delivered: Rana Snare 04/03/2021    Rana Snare

## 2021-04-04 DIAGNOSIS — Z931 Gastrostomy status: Secondary | ICD-10-CM | POA: Diagnosis not present

## 2021-04-04 DIAGNOSIS — R410 Disorientation, unspecified: Secondary | ICD-10-CM | POA: Diagnosis not present

## 2021-04-04 DIAGNOSIS — U071 COVID-19: Secondary | ICD-10-CM | POA: Diagnosis not present

## 2021-04-04 DIAGNOSIS — R1310 Dysphagia, unspecified: Secondary | ICD-10-CM | POA: Diagnosis not present

## 2021-04-04 DIAGNOSIS — I4891 Unspecified atrial fibrillation: Secondary | ICD-10-CM

## 2021-04-04 LAB — CBC WITH DIFFERENTIAL/PLATELET
Abs Immature Granulocytes: 0.03 10*3/uL (ref 0.00–0.07)
Basophils Absolute: 0 10*3/uL (ref 0.0–0.1)
Basophils Relative: 0 %
Eosinophils Absolute: 0 10*3/uL (ref 0.0–0.5)
Eosinophils Relative: 0 %
HCT: 26 % — ABNORMAL LOW (ref 39.0–52.0)
Hemoglobin: 8.5 g/dL — ABNORMAL LOW (ref 13.0–17.0)
Immature Granulocytes: 0 %
Lymphocytes Relative: 21 %
Lymphs Abs: 2.2 10*3/uL (ref 0.7–4.0)
MCH: 31.8 pg (ref 26.0–34.0)
MCHC: 32.7 g/dL (ref 30.0–36.0)
MCV: 97.4 fL (ref 80.0–100.0)
Monocytes Absolute: 1.2 10*3/uL — ABNORMAL HIGH (ref 0.1–1.0)
Monocytes Relative: 12 %
Neutro Abs: 6.8 10*3/uL (ref 1.7–7.7)
Neutrophils Relative %: 67 %
Platelets: 155 10*3/uL (ref 150–400)
RBC: 2.67 MIL/uL — ABNORMAL LOW (ref 4.22–5.81)
RDW: 17.5 % — ABNORMAL HIGH (ref 11.5–15.5)
WBC: 10.3 10*3/uL (ref 4.0–10.5)
nRBC: 0 % (ref 0.0–0.2)

## 2021-04-04 LAB — MAGNESIUM: Magnesium: 1.8 mg/dL (ref 1.7–2.4)

## 2021-04-04 LAB — BASIC METABOLIC PANEL WITH GFR
Anion gap: 8 (ref 5–15)
BUN: 45 mg/dL — ABNORMAL HIGH (ref 8–23)
CO2: 29 mmol/L (ref 22–32)
Calcium: 8.9 mg/dL (ref 8.9–10.3)
Chloride: 103 mmol/L (ref 98–111)
Creatinine, Ser: 1.15 mg/dL (ref 0.61–1.24)
GFR, Estimated: 58 mL/min — ABNORMAL LOW
Glucose, Bld: 95 mg/dL (ref 70–99)
Potassium: 3.2 mmol/L — ABNORMAL LOW (ref 3.5–5.1)
Sodium: 140 mmol/L (ref 135–145)

## 2021-04-04 LAB — GLUCOSE, CAPILLARY: Glucose-Capillary: 92 mg/dL (ref 70–99)

## 2021-04-04 MED ORDER — TRAZODONE HCL 50 MG PO TABS
25.0000 mg | ORAL_TABLET | Freq: Every day | ORAL | Status: AC
  Administered 2021-04-04: 25 mg via ORAL
  Filled 2021-04-04: qty 1

## 2021-04-04 MED ORDER — QUETIAPINE FUMARATE 25 MG PO TABS: 25.0000 mg | ORAL_TABLET | Freq: Every day | ORAL | Status: DC

## 2021-04-04 MED ORDER — TRAZODONE HCL 50 MG PO TABS: 25.0000 mg | ORAL_TABLET | Freq: Every day | ORAL | Status: DC

## 2021-04-04 MED ORDER — QUETIAPINE FUMARATE 25 MG PO TABS
12.5000 mg | ORAL_TABLET | Freq: Every day | ORAL | Status: DC
  Administered 2021-04-04: 12.5 mg
  Filled 2021-04-04: qty 1

## 2021-04-04 MED ORDER — MAGNESIUM SULFATE 2 GM/50ML IV SOLN
2.0000 g | Freq: Once | INTRAVENOUS | Status: AC
  Administered 2021-04-04: 2 g via INTRAVENOUS
  Filled 2021-04-04: qty 50

## 2021-04-04 MED ORDER — POTASSIUM CHLORIDE 20 MEQ PO PACK
40.0000 meq | PACK | ORAL | Status: AC
  Administered 2021-04-04: 40 meq
  Filled 2021-04-04 (×2): qty 2

## 2021-04-04 MED ORDER — QUETIAPINE FUMARATE 25 MG PO TABS
12.5000 mg | ORAL_TABLET | Freq: Once | ORAL | Status: AC
  Administered 2021-04-04: 12.5 mg
  Filled 2021-04-04: qty 1

## 2021-04-04 NOTE — Progress Notes (Signed)
Inpatient Rehab Admissions Coordinator:   I spoke with pt's family and rehab MD. We are able to accept Pt. Back to CIR once off COVID precautions (anticipated to end 04/05/21). I will follow for re-admission pending medical readiness and bed availability.   Clemens Catholic, [place], [place] Admissions Coordinator  [phone] ([place]) [phone] (office)

## 2021-04-04 NOTE — Progress Notes (Incomplete)
° °  Subjective:  Overnight events: none   Seen at bedside during rounds. He is delirious this morning. He is oriented to person and possible time but not place and situation. Son is at bedside, reports that he has never been like this. Son is eager for pt to return to CIR.   Objective:  Vital signs in last 24 hours: Vitals:   04/04/21 0500 04/04/21 0600 04/04/21 0753 04/04/21 1211  BP:   (!) 143/89 (!) 132/95  Pulse: 71 71 71   Resp:      Temp:      TempSrc:      SpO2: 93% 91%    Weight:      Height:       Constitutional: chronically ill appearing and in no distress  Head: Normocephalic and atraumatic Eyes: EOM are normal.  Neck: Normal range of motion  Cardiovascular: V-paced. No LE edema   Pulmonary: Non labored breathing on room air, LCTAB  Abdominal: Soft. Normal bowel sounds. Non distended, non tender.  Musculoskeletal: Decreased bulk    Neurological: Alert and oriented to person and time, but not place and situation. More delirious compared to yesterday.  Skin: Skin is warm and dry     Assessment/Plan:  Principal Problem:   COVID-19 Active Problems:   Chronic atrial fibrillation s/p ablation    S/P TAVR (transcatheter aortic valve replacement)   CRI (chronic renal insufficiency), stage 3 (moderate) (HCC)   Dysphagia s/p G tube    Protein-calorie malnutrition, severe   Debility   COVID-19 virus infection   Acute on chronic anemia with recent GI bleed   Complete heart block s/p PPM    Fever   Hypothyroidism  Mr Runco, 88 y.o. male with PMHx of A Fib s/p ablation, CHB s/p PPM, HTN,  HLD, hypothyroidism, dysphagia and malnutrition s/p G tube (placed in [place] 02/10/21), CKD 3 stage transferred from CIR for COVID infection.   COVID-19 infection Currently afebrile, saturating well on RA with some desaturations to low 90's. Decadron dc'd after inflammatory markers trended down, with negative procalcitonin. Pt is stable for discharge from our service at this  time, pending transfer to CIR on 2/22 pending lifting COVID precautions.  -No longer on decadron -PT/OT following  -Transfer to CIR 2/22, pending bed   Hospital delirium Worsened hospital delirium today in the setting of new location and abrupt change in his routine. Important to establish routine again.  - Continue Trazodone 25-50 mg at bedtime prn   Dysphagia s/p G tube 01/2021 SLP re-eval this admission: ice chips for comfort only and be NPO all other times.   -Continue feeds via PEG  Anemia d/t ACD and GIB Eliquis on hold. Hgb was stable near 7.5-8 on admission.  -Transfuse for hgb<7  -Continue IV protonix BID, carafate, and Fe supplementation.   Atrial flutter/fib s/p ablation  CHB s/p PPM  Per cardiology consult note from 03/10/2021, pt's family decided along with cardiology that eliquis should be held.  -Continue to hold eliquis   CKD stage 3 Stable. Cr 1.1 *** (baseline 1.1-1.2) -CTM  -Avoid nephrotoxic agents -Receiving nutrition and free water via his PEG tube   Hypothyroidism -Continue home synthroid    Diet: NPO VTE: Levenox 30 Fluids: Free water with PEG  Code: Full  Lajean Manes, MD  Internal Medicine Resident, PGY-1 [facility]  Pager: [phone]

## 2021-04-04 NOTE — Progress Notes (Signed)
Physical Therapy Treatment Patient Details Name: Gregory [name] MRN: [id number] DOB: 08/05/1920 Today's Date: 04/04/2021   History of Present Illness 88 y.o. male with medical history significant of  A Fib s/p ablation, CHB s/p PPM, HTN,  HLD, hypothyroidism, neuropathy, lung nodule, TVAR, dysphagia and malnutrition s/p G tube (placed in [place] 02/10/21), CKD 3 stage who was admitted on 03/02/21 with hypotension, dizziness, near syncope and hgb 5.9. +GI bleed; transfer to AIR with return to acute hospital 03/31/21 due to fever, weakness, COVID+    PT Comments    Patient more confused today and with difficulty following commands/sequencing tasks. Continues with posterior and right-leaning bias in standing. Pt reports fear of "slippery floor" and fear of falling. Repeated transfer x 5 reps with +2 max assist. For pt safety, returned to chair position in the bed.     Recommendations for follow up therapy are one component of a multi-disciplinary discharge planning process, led by the attending physician.  Recommendations may be updated based on patient status, additional functional criteria and insurance authorization.  Follow Up Recommendations  Acute inpatient rehab (3hours/day)     Assistance Recommended at Discharge Frequent or constant Supervision/Assistance  Patient can return home with the following Assistance with cooking/housework;Assist for transportation;Help with stairs or ramp for entrance;Two people to help with walking and/or transfers;Two people to help with bathing/dressing/bathroom;Direct supervision/assist for medications management;Direct supervision/assist for financial management   Equipment Recommendations  Rolling walker (2 wheels);BSC/3in1;Wheelchair (measurements PT)    Recommendations for Other Services       Precautions / Restrictions Precautions Precautions: Fall Precaution Comments: NPO with PEG tube, posterior lean Restrictions Weight Bearing  Restrictions: No     Mobility  Bed Mobility Overal bed mobility: Needs Assistance Bed Mobility: Supine to Sit, Sit to Supine     Supine to sit: Mod assist Sit to supine: Max assist, +2 for physical assistance   General bed mobility comments: still with posterior lean as coming to sitting with incr time to get pt to midline and balancing without external support    Transfers Overall transfer level: Needs assistance Equipment used: Rolling walker (2 wheels) Transfers: Sit to/from Stand Sit to Stand: Max assist, +2 physical assistance, From elevated surface (air bed)           General transfer comment: pt continues with posterior bias as coming to stand; could not sequence pushing off surface and allowed to put both hands on RW as coming to stand; pt unable to shift weight forward over his feet and returned to sitting after max standing 10 seconds (x5 reps)    Ambulation/Gait               General Gait Details: unsafe to attempt with posterior-right lean in standing   Stairs             Wheelchair Mobility    Modified Rankin (Stroke Patients Only)       Balance Overall balance assessment: Needs assistance Sitting-balance support: No upper extremity supported, Feet supported Sitting balance-Leahy Scale: Poor Sitting balance - Comments: Static sitting EOB with supervision. Postural control: Posterior lean Standing balance support: Bilateral upper extremity supported, During functional activity Standing balance-Leahy Scale: Zero Standing balance comment: Reliant on UE support                            Cognition Arousal/Alertness: Awake/alert Behavior During Therapy: WFL for tasks assessed/performed Overall Cognitive Status: Impaired/Different from baseline Area of  Impairment: Orientation, Attention, Following commands, Safety/judgement, Awareness, Problem solving                 Orientation Level: Situation (time NT) Current  Attention Level: Sustained   Following Commands: Follows one step commands with increased time Safety/Judgement: Decreased awareness of safety, Decreased awareness of deficits Awareness:  (pre-intellectual) Problem Solving: Slow processing, Difficulty sequencing, Requires verbal cues, Requires tactile cues General Comments: thinks he is going home by car today; thinks he can walk across the room to the chair        Exercises Low Level/ICU Exercises Heel Slides: AROM, Both, 10 reps Stabilized Bridging: AROM, Both, 10 reps Other Exercises Other Exercises: SLR x 10 bil; hooklying marching x 20 reps; attempted SAQ and quad sets with pt unable to understand    General Comments General comments (skin integrity, edema, etc.): Per RN, pt has been very confused today and trying to get OOB by himself      Pertinent Vitals/Pain Pain Assessment Pain Assessment: No/denies pain    Home Living                          Prior Function            PT Goals (current goals can now be found in the care plan section) Acute Rehab PT Goals Patient Stated Goal: Regain independence, return home Time For Goal Achievement: 04/15/21 Potential to Achieve Goals: Good Progress towards PT goals: Progressing toward goals    Frequency    Min 3X/week      PT Plan Current plan remains appropriate    Co-evaluation              AM-PAC PT "6 Clicks" Mobility   Outcome Measure  Help needed turning from your back to your side while in a flat bed without using bedrails?: A Lot Help needed moving from lying on your back to sitting on the side of a flat bed without using bedrails?: A Lot Help needed moving to and from a bed to a chair (including a wheelchair)?: Total Help needed standing up from a chair using your arms (e.g., wheelchair or bedside chair)?: Total Help needed to walk in hospital room?: Total Help needed climbing 3-5 steps with a railing? : Total 6 Click Score: 8    End  of Session Equipment Utilized During Treatment: Gait belt Activity Tolerance: Patient tolerated treatment well Patient left: in bed;with call bell/phone within reach;with bed alarm set Nurse Communication: Mobility status PT Visit Diagnosis: Other abnormalities of gait and mobility (R26.89);Muscle weakness (generalized) (M62.81);Unsteadiness on feet (R26.81);Difficulty in walking, not elsewhere classified (R26.2)     Time: 1330-1400 PT Time Calculation (min) (ACUTE ONLY): 30 min  Charges:  $Therapeutic Activity: 23-37 mins                      Arby Barrette, PT Acute Rehabilitation Services  Pager [phone] Office [phone]    Rexanne Mano 04/04/2021, 2:29 PM

## 2021-04-04 NOTE — Discharge Summary (Signed)
Name: Gregory [name] MRN: [id number] DOB: [birth year] 88 y.o. PCP: Orpah Greek, DO  Date of Admission: 03/31/2021  5:58 PM Date of Discharge:  04/05/21 Attending Physician: Dr. Philipp Ovens  DISCHARGE DIAGNOSIS:  Primary Problem: COVID-19   Hospital Problems: Principal Problem:   COVID-19 Active Problems:   Chronic atrial fibrillation s/p ablation    S/P TAVR (transcatheter aortic valve replacement)   CRI (chronic renal insufficiency), stage 3 (moderate) (HCC)   Dysphagia s/p G tube    Protein-calorie malnutrition, severe   Debility   COVID-19 virus infection   Acute on chronic anemia with recent GI bleed   Complete heart block s/p PPM    Fever   Hypothyroidism   Acute delirium    DISCHARGE MEDICATIONS:   Allergies as of 04/05/2021       Reactions   Cefepime Other (See Comments)   Severe myoclonus for cefepime- don't take again-    Amlodipine Besylate    Other reaction(s): edema   Penicillins Hives, Rash        Medication List     STOP taking these medications    Eliquis 2.5 MG Tabs tablet Generic drug: apixaban   NUTREN 1.5 EN Replaced by: feeding supplement (NEPRO CARB STEADY) Liqd   senna 8.6 MG Tabs tablet Commonly known as: SENOKOT       TAKE these medications    acetaminophen 325 MG tablet Commonly known as: TYLENOL Place 1-2 tablets (325-650 mg total) into feeding tube every 4 (four) hours as needed for mild pain.   alum & mag hydroxide-simeth 200-200-20 MG/5ML suspension Commonly known as: MAALOX/MYLANTA Place 30 mLs into feeding tube every 4 (four) hours as needed for indigestion.   atorvastatin 40 MG tablet Commonly known as: LIPITOR Place 1 tablet (40 mg total) into feeding tube at bedtime. What changed: Another medication with the same name was added. Make sure you understand how and when to take each.   atorvastatin 40 MG tablet Commonly known as: LIPITOR Place 1 tablet (40 mg total) into feeding tube at bedtime. What  changed: You were already taking a medication with the same name, and this prescription was added. Make sure you understand how and when to take each.   bisacodyl 10 MG suppository Commonly known as: DULCOLAX Place 1 suppository (10 mg total) rectally daily as needed for moderate constipation.   chlorhexidine 0.12 % solution Commonly known as: PERIDEX 15 mLs by Mouth Rinse route 2 (two) times daily.   diphenhydrAMINE 12.5 MG/5ML elixir Commonly known as: BENADRYL Place 5-10 mLs (12.5-25 mg total) into feeding tube every 6 (six) hours as needed for itching.   enoxaparin 30 MG/0.3ML injection Commonly known as: LOVENOX Inject 0.3 mLs (30 mg total) into the skin daily. Start taking on: April 06, 2021   feeding supplement (NEPRO CARB STEADY) Liqd Place 315 mLs into feeding tube 3 (three) times daily. Replaces: NUTREN 1.5 EN   ferrous sulfate 220 (44 Fe) MG/5ML solution Place 5 mLs (220 mg total) into feeding tube 2 (two) times daily with a meal.   free water Soln Place 300 mLs into feeding tube 4 (four) times daily.   guaiFENesin-dextromethorphan 100-10 MG/5ML syrup Commonly known as: ROBITUSSIN DM Place 5-10 mLs into feeding tube every 6 (six) hours as needed for cough. What changed:  how much to take how to take this when to take this reasons to take this   levothyroxine 75 MCG tablet Commonly known as: SYNTHROID Place 1 tablet (75 mcg total) into feeding  tube daily at 6 (six) AM. Start taking on: April 06, 2021 What changed: when to take this   lip balm ointment Apply topically as needed for lip care.   mouth rinse Liqd solution 15 mLs by Mouth Rinse route 4 (four) times daily.   pantoprazole sodium 40 mg Commonly known as: PROTONIX Place 40 mg into feeding tube 2 (two) times daily before a meal.   phosphorus 155-852-130 MG tablet Commonly known as: K PHOS NEUTRAL Place 1 tablet (250 mg total) into feeding tube daily. Start taking on: April 06, 2021    polyethylene glycol 17 g packet Commonly known as: MIRALAX / GLYCOLAX Place 17 g into feeding tube daily as needed for mild constipation. What changed: reasons to take this   prochlorperazine 10 MG/2ML injection Commonly known as: COMPAZINE Inject 1-2 mLs (5-10 mg total) into the muscle every 6 (six) hours as needed.   prochlorperazine 25 MG suppository Commonly known as: COMPAZINE Place 0.5 suppositories (12.5 mg total) rectally every 6 (six) hours as needed for nausea.   prochlorperazine 5 MG tablet Commonly known as: COMPAZINE Place 1-2 tablets (5-10 mg total) into feeding tube every 6 (six) hours as needed for nausea.   QUEtiapine 25 MG tablet Commonly known as: SEROQUEL Place 0.5 tablets (12.5 mg total) into feeding tube at bedtime.   senna-docusate 8.6-50 MG tablet Commonly known as: Senokot-S Place 2 tablets into feeding tube at bedtime.   sucralfate 1 GM/10ML suspension Commonly known as: CARAFATE Place 10 mLs (1 g total) into feeding tube every 8 (eight) hours. What changed: when to take this               Discharge Care Instructions  (From admission, onward)           Start     Ordered   04/05/21 0000  Discharge wound care:       Comments: Foam dressings to bilat heels and bilat outer feet, change Q 3 days or PRN soiling   04/05/21 1117            DISPOSITION AND FOLLOW-UP:  Mr.Asahd Nydam was discharged from [facility] in stable condition. At the hospital follow up visit please address:  Follow-up Recommendations: Consults: none  Labs: none  Studies: none Medications: none   Follow-up Appointments:   HOSPITAL COURSE:  Patient Summary: COVID-19 infection Hypoxemia  Patient presented from [facility] after having a fever up to 102 F, cough, and weakness.  He was found to be COVID-19 positive.  His last fever was 1531 2/17up to 101.27F. He is saturating well on room air, though some desaturations have been noted.  He has  no leukocytosis. He has elevated inflammatory markers and his procalcitonin is negative. Started decadron, and discontinued after 3 days once inflammatory markers trended down. Very mild leukocytosis of 10.8 on day of discharge to CIR stable compared to prior day. Do not suspect infection at this time given no systemic symptoms; this is likely to be from decadron use.   PT/OT recommending CIR, and pt's son also preferring CIR given he is not mobile with walker yet. CIR to admit pt.    Hospital delirium Visual hallucinations on day 3 of admission. Likely hospital delirium in this 88 yo pt who has been in the hospital setting for >1 mo. It is also possible that decadron use contributed to delirium. Anticipate improvement after re-establishing routine at CIR.      Dysphagia s/p G tube 01/2021 SLP re-eval this  admission: ice chips for comfort only and be NPO all other times.   -Continued feeds via PEG   Anemia d/t ACD and GIB Eliquis on hold. Hgb stable this admission.  -Transfuse for hgb<7  -Continued IV protonix BID, carafate, and Fe supplementation.    Atrial flutter/fib s/p ablation  CHB s/p PPM  Per cardiology consult note from 03/10/2021, pt's family decided along with cardiology that eliquis should be held.  -Continued to hold eliquis    CKD stage 3 Stable. Cr 1.1 (baseline 1.1-1.2) -Avoided nephrotoxic agents -Received nutrition and free water via his PEG tube    Hypothyroidism -Continued home synthroid    DISCHARGE INSTRUCTIONS:   Discharge Instructions     Call MD for:  difficulty breathing, headache or visual disturbances   Complete by: As directed    Call MD for:  extreme fatigue   Complete by: As directed    Call MD for:  hives   Complete by: As directed    Call MD for:  persistant dizziness or light-headedness   Complete by: As directed    Call MD for:  persistant nausea and vomiting   Complete by: As directed    Call MD for:  redness, tenderness, or signs of  infection (pain, swelling, redness, odor or green/yellow discharge around incision site)   Complete by: As directed    Call MD for:  severe uncontrolled pain   Complete by: As directed    Call MD for:  temperature >100.4   Complete by: As directed    Diet - low sodium heart healthy   Complete by: As directed    Discharge wound care:   Complete by: As directed    Foam dressings to bilat heels and bilat outer feet, change Q 3 days or PRN soiling   Increase activity slowly   Complete by: As directed        SUBJECTIVE:  No acute overnight events. Patient was seen at bedside during rounds this morning. He is working with PT andr reports feeling well.  No other complains or concerns at this time.   All questions were addressed with patient prior to being discharged.   Discharge Vitals:   BP 139/88 (BP Location: Left Arm)    Pulse 96    Temp 98.6 F (37 C) (Oral)    Resp 20    Ht 5\' 8"  (1.727 m)    Wt 71 kg    SpO2 91%    BMI 23.80 kg/m   OBJECTIVE:  Constitutional: chronically ill appearing and in no distress  Head: Normocephalic and atraumatic Eyes: EOM are normal  Neck: Normal range of motion  Pulmonary: Non labored breathing on room air Neurological: Not delirious this morning. Follows commands.  Skin: Skin is warm and dry    Pertinent Labs, Studies, and Procedures:  CBC Latest Ref Rng & Units 04/05/2021 04/04/2021 04/02/2021  WBC 4.0 - 10.5 K/uL 10.8(H) 10.3 6.6  Hemoglobin 13.0 - 17.0 g/dL 9.0(L) 8.5(L) 8.8(L)  Hematocrit 39.0 - 52.0 % 28.3(L) 26.0(L) 26.9(L)  Platelets 150 - 400 K/uL 149(L) 155 161    CMP Latest Ref Rng & Units 04/05/2021 04/04/2021 04/03/2021  Glucose 70 - 99 mg/dL 132(H) 95 125(H)  BUN 8 - 23 mg/dL 35(H) 45(H) 50(H)  Creatinine 0.61 - 1.24 mg/dL 1.08 1.15 1.10  Sodium 135 - 145 mmol/L 139 140 136  Potassium 3.5 - 5.1 mmol/L 3.9 3.2(L) 3.8  Chloride 98 - 111 mmol/L 102 103 100  CO2 22 -  32 mmol/L 25 29 25   Calcium 8.9 - 10.3 mg/dL 8.9 8.9 8.8(L)   Total Protein 6.5 - 8.1 g/dL - - 6.2(L)  Total Bilirubin 0.3 - 1.2 mg/dL - - 0.6  Alkaline Phos 38 - 126 U/L - - 51  AST 15 - 41 U/L - - 40  ALT 0 - 44 U/L - - 27    DG CHEST PORT 1 VIEW  Result Date: 04/01/2021 CLINICAL DATA:  Hypoxemia. EXAM: PORTABLE CHEST 1 VIEW COMPARISON:  03/31/2021 FINDINGS: The cardio pericardial silhouette is enlarged. Status post TAVR diffuse interstitial opacity is similar to prior, suggesting edema. Left base collapse/consolidation is slightly progressive in the interval. Bones are diffusely demineralized. Telemetry leads overlie the chest. IMPRESSION: 1. Interval progression of left base collapse/consolidation. 2. Similar appearance of diffuse interstitial opacity raising the question of interstitial edema. Electronically Signed   By: Misty Stanley M.D.   On: 04/01/2021 10:21   DG CHEST PORT 1 VIEW  Result Date: 03/31/2021 CLINICAL DATA:  Respiratory tract congestion with cough. EXAM: PORTABLE CHEST 1 VIEW COMPARISON:  03/16/2021 FINDINGS: Sequelae of transcatheter aortic valve replacement are again identified. A pacemaker remains in place with leads projecting over the right atrium and right ventricle. Aortic atherosclerosis is noted. The cardiac silhouette remains mildly enlarged. Mild bibasilar lung opacities are noted. No sizable pleural effusion or pneumothorax is identified. No acute osseous abnormality is seen. IMPRESSION: Mild bibasilar opacities, likely atelectasis. Electronically Signed   By: Logan Bores M.D.   On: 03/31/2021 09:08      Lajean Manes, MD Internal Medicine Resident, PGY-1

## 2021-04-05 ENCOUNTER — Encounter (HOSPITAL_COMMUNITY): Payer: Self-pay | Admitting: Physical Medicine and Rehabilitation

## 2021-04-05 ENCOUNTER — Inpatient Hospital Stay (HOSPITAL_COMMUNITY)
Admission: RE | Admit: 2021-04-05 | Discharge: 2021-04-14 | DRG: 945 | Disposition: A | Discharge status: Home Health | Payer: Medicare Other | Source: Intra-hospital | Attending: Physical Medicine and Rehabilitation | Admitting: Physical Medicine and Rehabilitation

## 2021-04-05 ENCOUNTER — Other Ambulatory Visit: Payer: Self-pay

## 2021-04-05 DIAGNOSIS — Z79899 Other long term (current) drug therapy: Secondary | ICD-10-CM

## 2021-04-05 DIAGNOSIS — Z85828 Personal history of other malignant neoplasm of skin: Secondary | ICD-10-CM | POA: Diagnosis not present

## 2021-04-05 DIAGNOSIS — K5900 Constipation, unspecified: Secondary | ICD-10-CM | POA: Diagnosis present

## 2021-04-05 DIAGNOSIS — R5381 Other malaise: Principal | ICD-10-CM | POA: Diagnosis present

## 2021-04-05 DIAGNOSIS — Z79890 Hormone replacement therapy (postmenopausal): Secondary | ICD-10-CM

## 2021-04-05 DIAGNOSIS — L89616 Pressure-induced deep tissue damage of right heel: Secondary | ICD-10-CM | POA: Diagnosis present

## 2021-04-05 DIAGNOSIS — N1831 Chronic kidney disease, stage 3a: Secondary | ICD-10-CM | POA: Diagnosis present

## 2021-04-05 DIAGNOSIS — G4700 Insomnia, unspecified: Secondary | ICD-10-CM | POA: Diagnosis present

## 2021-04-05 DIAGNOSIS — Z6823 Body mass index (BMI) 23.0-23.9, adult: Secondary | ICD-10-CM | POA: Diagnosis not present

## 2021-04-05 DIAGNOSIS — R1310 Dysphagia, unspecified: Secondary | ICD-10-CM

## 2021-04-05 DIAGNOSIS — H9190 Unspecified hearing loss, unspecified ear: Secondary | ICD-10-CM | POA: Diagnosis present

## 2021-04-05 DIAGNOSIS — R627 Adult failure to thrive: Secondary | ICD-10-CM | POA: Diagnosis present

## 2021-04-05 DIAGNOSIS — Z931 Gastrostomy status: Secondary | ICD-10-CM

## 2021-04-05 DIAGNOSIS — R410 Disorientation, unspecified: Secondary | ICD-10-CM | POA: Diagnosis present

## 2021-04-05 DIAGNOSIS — E46 Unspecified protein-calorie malnutrition: Secondary | ICD-10-CM | POA: Diagnosis present

## 2021-04-05 DIAGNOSIS — N183 Chronic kidney disease, stage 3 unspecified: Secondary | ICD-10-CM | POA: Diagnosis present

## 2021-04-05 DIAGNOSIS — Z8249 Family history of ischemic heart disease and other diseases of the circulatory system: Secondary | ICD-10-CM | POA: Diagnosis not present

## 2021-04-05 DIAGNOSIS — E876 Hypokalemia: Secondary | ICD-10-CM | POA: Diagnosis present

## 2021-04-05 DIAGNOSIS — Z950 Presence of cardiac pacemaker: Secondary | ICD-10-CM | POA: Diagnosis not present

## 2021-04-05 DIAGNOSIS — R251 Tremor, unspecified: Secondary | ICD-10-CM | POA: Diagnosis present

## 2021-04-05 DIAGNOSIS — Z8616 Personal history of COVID-19: Secondary | ICD-10-CM | POA: Diagnosis not present

## 2021-04-05 DIAGNOSIS — Z806 Family history of leukemia: Secondary | ICD-10-CM

## 2021-04-05 DIAGNOSIS — E039 Hypothyroidism, unspecified: Secondary | ICD-10-CM | POA: Diagnosis present

## 2021-04-05 DIAGNOSIS — E162 Hypoglycemia, unspecified: Secondary | ICD-10-CM | POA: Diagnosis not present

## 2021-04-05 DIAGNOSIS — I482 Chronic atrial fibrillation, unspecified: Secondary | ICD-10-CM | POA: Diagnosis not present

## 2021-04-05 DIAGNOSIS — R1313 Dysphagia, pharyngeal phase: Secondary | ICD-10-CM | POA: Diagnosis present

## 2021-04-05 DIAGNOSIS — I442 Atrioventricular block, complete: Secondary | ICD-10-CM

## 2021-04-05 DIAGNOSIS — I129 Hypertensive chronic kidney disease with stage 1 through stage 4 chronic kidney disease, or unspecified chronic kidney disease: Secondary | ICD-10-CM | POA: Diagnosis present

## 2021-04-05 DIAGNOSIS — U071 COVID-19: Secondary | ICD-10-CM | POA: Diagnosis not present

## 2021-04-05 LAB — CULTURE, BLOOD (ROUTINE X 2)
Culture: NO GROWTH
Culture: NO GROWTH
Special Requests: ADEQUATE
Special Requests: ADEQUATE

## 2021-04-05 LAB — CBC
HCT: 28.3 % — ABNORMAL LOW (ref 39.0–52.0)
Hemoglobin: 9 g/dL — ABNORMAL LOW (ref 13.0–17.0)
MCH: 31.1 pg (ref 26.0–34.0)
MCHC: 31.8 g/dL (ref 30.0–36.0)
MCV: 97.9 fL (ref 80.0–100.0)
Platelets: 149 10*3/uL — ABNORMAL LOW (ref 150–400)
RBC: 2.89 MIL/uL — ABNORMAL LOW (ref 4.22–5.81)
RDW: 17.5 % — ABNORMAL HIGH (ref 11.5–15.5)
WBC: 10.8 10*3/uL — ABNORMAL HIGH (ref 4.0–10.5)
nRBC: 0 % (ref 0.0–0.2)

## 2021-04-05 LAB — MAGNESIUM: Magnesium: 2.3 mg/dL (ref 1.7–2.4)

## 2021-04-05 LAB — BASIC METABOLIC PANEL
Anion gap: 12 (ref 5–15)
BUN: 35 mg/dL — ABNORMAL HIGH (ref 8–23)
CO2: 25 mmol/L (ref 22–32)
Calcium: 8.9 mg/dL (ref 8.9–10.3)
Chloride: 102 mmol/L (ref 98–111)
Creatinine, Ser: 1.08 mg/dL (ref 0.61–1.24)
GFR, Estimated: >60 mL/min (ref >60)
Glucose, Bld: 132 mg/dL — ABNORMAL HIGH (ref 70–99)
Potassium: 3.9 mmol/L (ref 3.5–5.1)
Sodium: 139 mmol/L (ref 135–145)

## 2021-04-05 LAB — GLUCOSE, CAPILLARY
Glucose-Capillary: 103 mg/dL — ABNORMAL HIGH (ref 70–99)
Glucose-Capillary: 108 mg/dL — ABNORMAL HIGH (ref 70–99)
Glucose-Capillary: 125 mg/dL — ABNORMAL HIGH (ref 70–99)
Glucose-Capillary: 131 mg/dL — ABNORMAL HIGH (ref 70–99)

## 2021-04-05 MED ORDER — FLEET ENEMA 7-19 GM/118ML RE ENEM: 1.0000 | ENEMA | Freq: Once | RECTAL | Status: DC | PRN

## 2021-04-05 MED ORDER — PROCHLORPERAZINE 25 MG RE SUPP: 12.5000 mg | Freq: Four times a day (QID) | RECTAL | Status: DC | PRN

## 2021-04-05 MED ORDER — QUETIAPINE FUMARATE 25 MG PO TABS
12.5000 mg | ORAL_TABLET | Freq: Every day | ORAL | Status: DC
  Administered 2021-04-05 – 2021-04-12 (×8): 12.5 mg
  Filled 2021-04-05 (×8): qty 1

## 2021-04-05 MED ORDER — LIP MEDEX EX OINT
TOPICAL_OINTMENT | CUTANEOUS | 0 refills | Status: DC | PRN
Start: 2021-04-05 — End: 2021-05-19

## 2021-04-05 MED ORDER — BISACODYL 10 MG RE SUPP
10.0000 mg | Freq: Every day | RECTAL | Status: DC | PRN
Start: 2021-04-05 — End: 2021-04-14

## 2021-04-05 MED ORDER — K PHOS MONO-SOD PHOS DI & MONO 155-852-130 MG PO TABS: 250.0000 mg | ORAL_TABLET | Freq: Every day | ORAL | 0 refills | Status: DC

## 2021-04-05 MED ORDER — NEPRO/CARBSTEADY PO LIQD: 315.0000 mL | Freq: Three times a day (TID) | ORAL | 0 refills | Status: AC

## 2021-04-05 MED ORDER — CHLORHEXIDINE GLUCONATE 0.12 % MT SOLN
15.0000 mL | Freq: Two times a day (BID) | OROMUCOSAL | 0 refills | Status: DC
Start: 2021-04-05 — End: 2021-09-13

## 2021-04-05 MED ORDER — BISACODYL 10 MG RE SUPP
10.0000 mg | Freq: Every day | RECTAL | 0 refills | Status: DC | PRN
Start: 2021-04-05 — End: 2021-05-26

## 2021-04-05 MED ORDER — POLYETHYLENE GLYCOL 3350 17 G PO PACK: 17.0000 g | PACK | Freq: Every day | ORAL | 0 refills | Status: DC | PRN

## 2021-04-05 MED ORDER — LEVOTHYROXINE SODIUM 75 MCG PO TABS: 75.0000 ug | ORAL_TABLET | Freq: Every day | ORAL | 0 refills | Status: AC

## 2021-04-05 MED ORDER — QUETIAPINE FUMARATE 25 MG PO TABS: 12.5000 mg | ORAL_TABLET | Freq: Every day | ORAL | 0 refills | Status: DC

## 2021-04-05 MED ORDER — PROCHLORPERAZINE EDISYLATE 10 MG/2ML IJ SOLN: 5.0000 mg | Freq: Four times a day (QID) | INTRAMUSCULAR | Status: DC | PRN

## 2021-04-05 MED ORDER — MELATONIN 5 MG PO TABS
5.0000 mg | ORAL_TABLET | Freq: Every evening | ORAL | Status: DC | PRN
  Administered 2021-04-06 – 2021-04-11 (×5): 5 mg
  Filled 2021-04-05 (×5): qty 1

## 2021-04-05 MED ORDER — FREE WATER
300.0000 mL | Freq: Four times a day (QID) | Status: DC
  Administered 2021-04-05 – 2021-04-14 (×35): 300 mL

## 2021-04-05 MED ORDER — SENNOSIDES-DOCUSATE SODIUM 8.6-50 MG PO TABS
2.0000 | ORAL_TABLET | Freq: Every day | ORAL | Status: DC
  Administered 2021-04-05 – 2021-04-13 (×9): 2
  Filled 2021-04-05 (×9): qty 2

## 2021-04-05 MED ORDER — FERROUS SULFATE 220 (44 FE) MG/5ML PO ELIX
220.0000 mg | ORAL_SOLUTION | Freq: Two times a day (BID) | ORAL | Status: DC
Start: 2021-04-05 — End: 2021-04-14
  Administered 2021-04-05 – 2021-04-14 (×18): 220 mg
  Filled 2021-04-05 (×18): qty 5

## 2021-04-05 MED ORDER — PANTOPRAZOLE SODIUM 40 MG PO PACK: 40.0000 mg | PACK | Freq: Two times a day (BID) | ORAL | 0 refills | Status: DC

## 2021-04-05 MED ORDER — PROCHLORPERAZINE MALEATE 5 MG PO TABS: 5.0000 mg | ORAL_TABLET | Freq: Four times a day (QID) | ORAL | Status: DC | PRN

## 2021-04-05 MED ORDER — CHLORHEXIDINE GLUCONATE 0.12 % MT SOLN
15.0000 mL | Freq: Two times a day (BID) | OROMUCOSAL | Status: DC
  Administered 2021-04-05 – 2021-04-14 (×15): 15 mL via OROMUCOSAL
  Filled 2021-04-05 (×17): qty 15

## 2021-04-05 MED ORDER — ORAL CARE MOUTH RINSE: 15.0000 mL | Freq: Four times a day (QID) | OROMUCOSAL | 0 refills | Status: DC

## 2021-04-05 MED ORDER — POLYETHYLENE GLYCOL 3350 17 G PO PACK: 17.0000 g | PACK | Freq: Every day | ORAL | Status: DC | PRN

## 2021-04-05 MED ORDER — SENNOSIDES-DOCUSATE SODIUM 8.6-50 MG PO TABS: 2.0000 | ORAL_TABLET | Freq: Every day | ORAL | 0 refills | Status: DC

## 2021-04-05 MED ORDER — SUCRALFATE 1 GM/10ML PO SUSP: 1.0000 g | Freq: Three times a day (TID) | ORAL | 0 refills | Status: DC

## 2021-04-05 MED ORDER — SUCRALFATE 1 GM/10ML PO SUSP
1.0000 g | Freq: Three times a day (TID) | ORAL | Status: DC
  Administered 2021-04-05 – 2021-04-14 (×27): 1 g
  Filled 2021-04-05 (×30): qty 10

## 2021-04-05 MED ORDER — K PHOS MONO-SOD PHOS DI & MONO 155-852-130 MG PO TABS
250.0000 mg | ORAL_TABLET | Freq: Every day | ORAL | Status: DC
  Administered 2021-04-06 – 2021-04-14 (×9): 250 mg
  Filled 2021-04-05 (×9): qty 1

## 2021-04-05 MED ORDER — ATORVASTATIN CALCIUM 40 MG PO TABS
40.0000 mg | ORAL_TABLET | Freq: Every day | ORAL | Status: DC
  Administered 2021-04-05 – 2021-04-13 (×9): 40 mg
  Filled 2021-04-05 (×9): qty 1

## 2021-04-05 MED ORDER — PROCHLORPERAZINE 25 MG RE SUPP
12.5000 mg | Freq: Four times a day (QID) | RECTAL | 0 refills | Status: DC | PRN
Start: 2021-04-05 — End: 2021-04-14

## 2021-04-05 MED ORDER — ALUM & MAG HYDROXIDE-SIMETH 200-200-20 MG/5ML PO SUSP
30.0000 mL | ORAL | 0 refills | Status: DC | PRN
Start: 2021-04-05 — End: 2021-05-26

## 2021-04-05 MED ORDER — PANTOPRAZOLE 2 MG/ML SUSPENSION
40.0000 mg | Freq: Two times a day (BID) | ORAL | Status: DC
Start: 2021-04-05 — End: 2021-04-14
  Administered 2021-04-05 – 2021-04-14 (×18): 40 mg
  Filled 2021-04-05 (×18): qty 20

## 2021-04-05 MED ORDER — LIP MEDEX EX OINT
TOPICAL_OINTMENT | CUTANEOUS | Status: DC | PRN
  Filled 2021-04-05: qty 7

## 2021-04-05 MED ORDER — ENOXAPARIN SODIUM 30 MG/0.3ML IJ SOSY
30.0000 mg | PREFILLED_SYRINGE | INTRAMUSCULAR | Status: DC
  Administered 2021-04-06: 30 mg via SUBCUTANEOUS
  Filled 2021-04-05: qty 0.3

## 2021-04-05 MED ORDER — ENOXAPARIN SODIUM 30 MG/0.3ML IJ SOSY: 30.0000 mg | PREFILLED_SYRINGE | INTRAMUSCULAR | Status: DC

## 2021-04-05 MED ORDER — PROCHLORPERAZINE MALEATE 5 MG PO TABS: 5.0000 mg | ORAL_TABLET | Freq: Four times a day (QID) | ORAL | 0 refills | Status: DC | PRN

## 2021-04-05 MED ORDER — LEVOTHYROXINE SODIUM 75 MCG PO TABS
75.0000 ug | ORAL_TABLET | Freq: Every day | ORAL | Status: DC
  Administered 2021-04-06 – 2021-04-14 (×9): 75 ug
  Filled 2021-04-05 (×9): qty 1

## 2021-04-05 MED ORDER — ENOXAPARIN SODIUM 30 MG/0.3ML IJ SOSY: 30.0000 mg | PREFILLED_SYRINGE | INTRAMUSCULAR | 0 refills | Status: DC

## 2021-04-05 MED ORDER — ATORVASTATIN CALCIUM 40 MG PO TABS: 40.0000 mg | ORAL_TABLET | Freq: Every day | ORAL | 0 refills | Status: DC

## 2021-04-05 MED ORDER — FERROUS SULFATE 220 (44 FE) MG/5ML PO ELIX: 220.0000 mg | ORAL_SOLUTION | Freq: Two times a day (BID) | ORAL | 3 refills | Status: DC

## 2021-04-05 MED ORDER — GUAIFENESIN-DM 100-10 MG/5ML PO SYRP: 5.0000 mL | ORAL_SOLUTION | Freq: Four times a day (QID) | ORAL | 0 refills | Status: DC | PRN

## 2021-04-05 MED ORDER — NEPRO/CARBSTEADY PO LIQD
315.0000 mL | Freq: Three times a day (TID) | ORAL | Status: DC
  Administered 2021-04-05 – 2021-04-09 (×12): 315 mL
  Administered 2021-04-09 (×2): 237 mL
  Administered 2021-04-10: 316 mL
  Administered 2021-04-10 – 2021-04-14 (×12): 315 mL

## 2021-04-05 MED ORDER — GUAIFENESIN-DM 100-10 MG/5ML PO SYRP: 5.0000 mL | ORAL_SOLUTION | Freq: Four times a day (QID) | ORAL | Status: DC | PRN

## 2021-04-05 MED ORDER — FREE WATER: 300.0000 mL | Freq: Four times a day (QID) | 0 refills | Status: AC

## 2021-04-05 MED ORDER — PROCHLORPERAZINE EDISYLATE 10 MG/2ML IJ SOLN: 5.0000 mg | Freq: Four times a day (QID) | INTRAMUSCULAR | 0 refills | Status: DC | PRN

## 2021-04-05 MED ORDER — DIPHENHYDRAMINE HCL 12.5 MG/5ML PO ELIX: 12.5000 mg | ORAL_SOLUTION | Freq: Four times a day (QID) | ORAL | 0 refills | Status: DC | PRN

## 2021-04-05 NOTE — H&P (Signed)
Physical Medicine and Rehabilitation Admission H&P    CC: Debility   HPI: Gregory [name] is a 88 year old male with history of a flutter s/p ablation W-PPM, HTN, neuropathy, HOH, dysphagia with weight loss status post G-tube placement at [facility] 02/10/2021 and who was recently admitted with acute GI bleed on 03/02/2021.  He was treated with IV fluids as well as 2 units PRBCs as well as PPI.  Eliquis was discontinued and conservative care recommended by Dr. Kathryne Sharper.  Patient was noted to have functional decline and was admitted to CIR on 03/10/21  GI and cardiology were consulted for input on Eliquis and recommended discontinuation due to risk of ongoing bleeding with heme positive stools and variable H&H.  He was kept n.p.o. as MBS showed severe to moderate pharyngeal dysphagia with silent aspiration.  Hospital course was leukocytosis due to citrobacter amalnaticus UTI which was treated with cefepime however patient developed side effect of myoclonus therefore this was discontinued.  He was treated with Septra x4 days with some worsening of his renal status and was hydrated with IV fluids.    He did develop malaise, productive cough and fever on 02/16.  He was found to be COVID-positive and transferred to acute care for treatment.  He was treated with remdesivir and steroids however has had issues with confusion and hallucination for past 3 days due to delirium and/or steroid psychosis.  He did have episode of nose bleed that resolved with brief  packing. He has also had insomnia X 2 days which has exacerbated his confusion.    Respiratory status has been stable, he has been afebrile and has completed COVID 19 treatment course.  He continues to be deconditioned and CIR was consulted to resume his rehab course. Daughter at bedside reports ongoing hallucinations.    Review of Systems  Unable to perform ROS: Mental acuity  Neurological:  Positive for weakness.  Psychiatric/Behavioral:  The patient  has insomnia.     Past Medical History:  Diagnosis Date   Atrial flutter [facility])    s/p ablation   Cancer [facility])    skin cancer   Cataracts, bilateral    Hypertension    Neuromuscular disorder ([place])    Pacemaker 2010   Thyroid disease     Past Surgical History:  Procedure Laterality Date   APPENDECTOMY     COLONOSCOPY     EYE SURGERY Bilateral    cataract surgery   HERNIA REPAIR Right    INGUINAL HERNIA REPAIR Left 10/12/2013   Procedure: LEFT  INGUINAL HERNIA REPAIR;  Surgeon: Odis Hollingshead, MD;  Location: [facility];  Service: General;  Laterality: Left;   INSERTION OF MESH Left 10/12/2013   Procedure: INSERTION OF MESH;  Surgeon: Odis Hollingshead, MD;  Location: [facility];  Service: General;  Laterality: Left;   PEG TUBE PLACEMENT Left     Family History  Problem Relation Age of Onset   Heart disease Mother    Cancer Father        leukemia    Social History: Lives with son and was independent prior to decline last fall secondary to FTT. Per  reports that he has never smoked. He has never used smokeless tobacco. He reports that he does not drink alcohol and does not use drugs.   Allergies  Allergen Reactions   Cefepime Other (See Comments)    Severe myoclonus for cefepime- don't take again-    Amlodipine Besylate     Other reaction(s):  edema   Penicillins Hives and Rash    Medications Prior to Admission  Medication Sig Dispense Refill   acetaminophen (TYLENOL) 325 MG tablet Place 1-2 tablets (325-650 mg total) into feeding tube every 4 (four) hours as needed for mild pain.     alum & mag hydroxide-simeth (MAALOX/MYLANTA) 200-200-20 MG/5ML suspension Place 30 mLs into feeding tube every 4 (four) hours as needed for indigestion. 355 mL 0   atorvastatin (LIPITOR) 40 MG tablet Place 1 tablet (40 mg total) into feeding tube at bedtime.     atorvastatin (LIPITOR) 40 MG tablet Place 1 tablet (40 mg total) into feeding tube at bedtime. 30 tablet 0   bisacodyl (DULCOLAX) 10  MG suppository Place 1 suppository (10 mg total) rectally daily as needed for moderate constipation. 12 suppository 0   chlorhexidine (PERIDEX) 0.12 % solution 15 mLs by Mouth Rinse route 2 (two) times daily. 120 mL 0   diphenhydrAMINE (BENADRYL) 12.5 MG/5ML elixir Place 5-10 mLs (12.5-25 mg total) into feeding tube every 6 (six) hours as needed for itching. 120 mL 0   [START ON 04/06/2021] enoxaparin (LOVENOX) 30 MG/0.3ML injection Inject 0.3 mLs (30 mg total) into the skin daily. 0 mL 0   ferrous sulfate 220 (44 Fe) MG/5ML solution Place 5 mLs (220 mg total) into feeding tube 2 (two) times daily with a meal. 150 mL 3   guaiFENesin-dextromethorphan (ROBITUSSIN DM) 100-10 MG/5ML syrup Place 5-10 mLs into feeding tube every 6 (six) hours as needed for cough. 118 mL 0   [START ON 04/06/2021] levothyroxine (SYNTHROID) 75 MCG tablet Place 1 tablet (75 mcg total) into feeding tube daily at 6 (six) AM. 30 tablet 0   lip balm (CARMEX) ointment Apply topically as needed for lip care. 7 g 0   Mouthwashes (MOUTH RINSE) LIQD solution 15 mLs by Mouth Rinse route 4 (four) times daily. 946 mL 0   Nutritional Supplements (FEEDING SUPPLEMENT, NEPRO CARB STEADY,) LIQD Place 315 mLs into feeding tube 3 (three) times daily. 30 mL 0   pantoprazole sodium (PROTONIX) 40 mg Place 40 mg into feeding tube 2 (two) times daily before a meal. 30 packet 0   [START ON 04/06/2021] phosphorus (K PHOS NEUTRAL) 155-852-130 MG tablet Place 1 tablet (250 mg total) into feeding tube daily. 30 tablet 0   polyethylene glycol (MIRALAX / GLYCOLAX) 17 g packet Place 17 g into feeding tube daily as needed for mild constipation. 14 each 0   prochlorperazine (COMPAZINE) 10 MG/2ML injection Inject 1-2 mLs (5-10 mg total) into the muscle every 6 (six) hours as needed. 240 mL 0   prochlorperazine (COMPAZINE) 25 MG suppository Place 0.5 suppositories (12.5 mg total) rectally every 6 (six) hours as needed for nausea. 12 suppository 0   prochlorperazine  (COMPAZINE) 5 MG tablet Place 1-2 tablets (5-10 mg total) into feeding tube every 6 (six) hours as needed for nausea. 30 tablet 0   QUEtiapine (SEROQUEL) 25 MG tablet Place 0.5 tablets (12.5 mg total) into feeding tube at bedtime. 30 tablet 0   senna-docusate (SENOKOT-S) 8.6-50 MG tablet Place 2 tablets into feeding tube at bedtime. 30 tablet 0   sucralfate (CARAFATE) 1 GM/10ML suspension Place 10 mLs (1 g total) into feeding tube every 8 (eight) hours. 420 mL 0   Water For Irrigation, Sterile (FREE WATER) SOLN Place 300 mLs into feeding tube 4 (four) times daily. 300 mL 0   Home: Home Living Family/patient expects to be discharged to:: Private residence Living Arrangements: Children Available Help  at Discharge: Family, Available 24 hours/day Type of Home: House Home Access: Stairs to enter [employer] of Steps: 2 Entrance Stairs-Rails: None Home Layout: Two level, Able to live on main level with bedroom/bathroom Alternate Level Stairs-Rails: Right Bathroom Shower/Tub: Multimedia programmer: Handicapped height Bathroom Accessibility: Yes Home Equipment: Cane - single point, Grab bars - tub/shower, Grab bars - toilet, Hand held shower head, Shower seat Additional Comments: Son lives at home with Pt. and can be with him 24/7  Lives With: Son   Functional History: Prior Function Prior Level of Function : Needs assist Mobility Comments: since AIR admission, requiring min assist to walk 10 ft with RW ADLs Comments: Does bird bathing on first floor; but handicapped accessible bathroom is upstairs   Functional Status:  Mobility: Bed Mobility Overal bed mobility: Needs Assistance Bed Mobility: Supine to Sit, Sit to Supine Rolling: Mod assist Sidelying to sit: +2 for physical assistance, Max assist Supine to sit: Mod assist Sit to supine: Max assist Sit to sidelying: Max assist, +2 for physical assistance General bed mobility comments: Able to assist in advancing  LEs to EOB, automatically pulling on bedrail to assist. With scooting of hips towards EOB, pt with sudden posterior lean due to fear of "sliding off of bed". With OT pushing B feet to ground, pt felt more secure and about to sit upright. Max A to get B LE in bed and repositioning trunk. Transfers Overall transfer level: Needs assistance Equipment used: Ambulation equipment used Transfers: Sit to/from Stand Sit to Stand: Min assist General transfer comment: with max cues to set up St[place] (placing feet on foot plate, hand placement), pt actually able to pull self up with Min A (x2 trials). Able to stand approx 2 min in [place] and pull self even taller on first trial. Ambulation/Gait General Gait Details: unsafe to attempt with posterior-right lean in standing Pre-gait activities: unable to clear feet in standing to side step toward [facility]   ADL: ADL Overall ADL's : Needs assistance/impaired Eating/Feeding: NPO Eating/Feeding Details (indicate cue type and reason): except for ice chips after mouth care Grooming: Supervision/safety, Bed level, Wash/dry face Grooming Details (indicate cue type and reason): cues to attend to task, thoroughness, assist to doff/don glasses due to tremors Upper Body Bathing: Minimal assistance, Bed level Lower Body Bathing: Total assistance, Bed level Upper Body Dressing : Total assistance, Bed level Lower Body Dressing: Total assistance, Bed level General ADL Comments: Focus on postural control/breaking posterior lean and progression of standing   Cognition: Cognition Overall Cognitive Status: Impaired/Different from baseline Orientation Level: Oriented to person, Disoriented to place, Disoriented to time, Disoriented to situation Cognition Arousal/Alertness: Awake/alert Behavior During Therapy: WFL for tasks assessed/performed Overall Cognitive Status: Impaired/Different from baseline Area of Impairment: Orientation, Attention, Following commands,  Safety/judgement, Awareness, Problem solving Orientation Level: Situation Current Attention Level: Sustained Following Commands: Follows one step commands with increased time Safety/Judgement: Decreased awareness of safety, Decreased awareness of deficits Awareness: Intellectual Problem Solving: Slow processing, Difficulty sequencing, Requires verbal cues, Requires tactile cues General Comments: pt pleasant, tangential conversation (hard to understand at times due to soft spoken); follows directions consistently, benefits from slow direction due to anxiety/fear of falling. variable appropriate responses in conversation; often refers to ladder up on the ceiling yesterday?    Blood pressure (!) 142/69, pulse 75, temperature 97.8 F (36.6 C), temperature source Oral, resp. rate 18, height 5\' 8"  (1.727 m), weight 71 kg, SpO2 96 %. Physical Exam Vitals and nursing note reviewed.  Constitutional:      Appearance: Normal appearance. Chronically ill appearing.  Cardiac: V-paced. Pulmonary: breathing comfortably.  Abdomen: NT, ND Skin:    Comments: Bilateral great toes with dark eschar.   Neurological:     Mental Status: He is alert.     Comments: HOH. Soft voice. Alert and oriented to self only. Was able to recall place after cues. 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 Occasional tremors BUE.   Extremities: in PRAFO boots.   Results for orders placed or performed during the hospital encounter of 03/31/21 (from the past 48 hour(s))  Glucose, capillary     Status: Abnormal   Collection Time: 04/03/21  6:07 PM  Result Value Ref Range   Glucose-Capillary 103 (H) 70 - 99 mg/dL    Comment: Glucose reference range applies only to samples taken after fasting for at least 8 hours.  Occult blood card to lab, stool RN will collect     Status: None   Collection Time: 04/03/21  8:50 PM  Result Value Ref Range   Fecal Occult Bld NEGATIVE NEGATIVE    Comment:  Performed at [facility], [address], [place], [place] [address]  Magnesium     Status: None   Collection Time: 04/04/21  1:07 AM  Result Value Ref Range   Magnesium 1.8 1.7 - 2.4 mg/dL    Comment: Performed at [facility] [address] [place], [place] [address]  Basic metabolic panel     Status: Abnormal   Collection Time: 04/04/21  1:07 AM  Result Value Ref Range   Sodium 140 135 - 145 mmol/L   Potassium 3.2 (L) 3.5 - 5.1 mmol/L   Chloride 103 98 - 111 mmol/L   CO2 29 22 - 32 mmol/L   Glucose, Bld 95 70 - 99 mg/dL    Comment: Glucose reference range applies only to samples taken after fasting for at least 8 hours.   BUN 45 (H) 8 - 23 mg/dL   Creatinine, Ser 1.15 0.61 - 1.24 mg/dL   Calcium 8.9 8.9 - 10.3 mg/dL   GFR, Estimated 58 (L) >60 mL/min    Comment: (NOTE) Calculated using the CKD-EPI Creatinine Equation (2021)    Anion gap 8 5 - 15    Comment: Performed at [facility] [address] [place], [place] [address]  CBC with Differential/Platelet     Status: Abnormal   Collection Time: 04/04/21  1:07 AM  Result Value Ref Range   WBC 10.3 4.0 - 10.5 K/uL   RBC 2.67 (L) 4.22 - 5.81 MIL/uL   Hemoglobin 8.5 (L) 13.0 - 17.0 g/dL   HCT 26.0 (L) 39.0 - 52.0 %   MCV 97.4 80.0 - 100.0 fL   MCH 31.8 26.0 - 34.0 pg   MCHC 32.7 30.0 - 36.0 g/dL   RDW 17.5 (H) 11.5 - 15.5 %   Platelets 155 150 - 400 K/uL   nRBC 0.0 0.0 - 0.2 %   Neutrophils Relative % 67 %   Neutro Abs 6.8 1.7 - 7.7 K/uL   Lymphocytes Relative 21 %   Lymphs Abs 2.2 0.7 - 4.0 K/uL   Monocytes Relative 12 %   Monocytes Absolute 1.2 (H) 0.1 - 1.0 K/uL   Eosinophils Relative 0 %   Eosinophils Absolute 0.0 0.0 - 0.5 K/uL   Basophils Relative 0 %   Basophils Absolute 0.0 0.0 - 0.1 K/uL   Immature Granulocytes 0 %  Abs Immature Granulocytes 0.03 0.00 - 0.07 K/uL    Comment: Performed at [facility], [place] [address] [place], [place] [address]  Glucose, capillary     Status: None    Collection Time: 04/04/21  8:05 AM  Result Value Ref Range   Glucose-Capillary 92 70 - 99 mg/dL    Comment: Glucose reference range applies only to samples taken after fasting for at least 8 hours.  Glucose, capillary     Status: Abnormal   Collection Time: 04/05/21 12:02 AM  Result Value Ref Range   Glucose-Capillary 131 (H) 70 - 99 mg/dL    Comment: Glucose reference range applies only to samples taken after fasting for at least 8 hours.  Magnesium     Status: None   Collection Time: 04/05/21  3:19 AM  Result Value Ref Range   Magnesium 2.3 1.7 - 2.4 mg/dL    Comment: Performed at [facility] [address] [place], [place] [address]  Basic metabolic panel     Status: Abnormal   Collection Time: 04/05/21  3:19 AM  Result Value Ref Range   Sodium 139 135 - 145 mmol/L   Potassium 3.9 3.5 - 5.1 mmol/L    Comment: DELTA CHECK NOTED   Chloride 102 98 - 111 mmol/L   CO2 25 22 - 32 mmol/L   Glucose, Bld 132 (H) 70 - 99 mg/dL    Comment: Glucose reference range applies only to samples taken after fasting for at least 8 hours.   BUN 35 (H) 8 - 23 mg/dL   Creatinine, Ser 1.08 0.61 - 1.24 mg/dL   Calcium 8.9 8.9 - 10.3 mg/dL   GFR, Estimated >60 >60 mL/min    Comment: (NOTE) Calculated using the CKD-EPI Creatinine Equation (2021)    Anion gap 12 5 - 15    Comment: Performed at [facility] [address], [place], [place] [address]  CBC     Status: Abnormal   Collection Time: 04/05/21  3:19 AM  Result Value Ref Range   WBC 10.8 (H) 4.0 - 10.5 K/uL   RBC 2.89 (L) 4.22 - 5.81 MIL/uL   Hemoglobin 9.0 (L) 13.0 - 17.0 g/dL   HCT 28.3 (L) 39.0 - 52.0 %   MCV 97.9 80.0 - 100.0 fL   MCH 31.1 26.0 - 34.0 pg   MCHC 31.8 30.0 - 36.0 g/dL   RDW 17.5 (H) 11.5 - 15.5 %   Platelets 149 (L) 150 - 400 K/uL   nRBC 0.0 0.0 - 0.2 %    Comment: Performed at [facility] [address], [place], [place] [address]  Glucose, capillary     Status: Abnormal   Collection Time:  04/05/21  6:26 AM  Result Value Ref Range   Glucose-Capillary 103 (H) 70 - 99 mg/dL    Comment: Glucose reference range applies only to samples taken after fasting for at least 8 hours.  Glucose, capillary     Status: Abnormal   Collection Time: 04/05/21 11:49 AM  Result Value Ref Range   Glucose-Capillary 108 (H) 70 - 99 mg/dL    Comment: Glucose reference range applies only to samples taken after fasting for at least 8 hours.   No results found.    Blood pressure (!) 142/69, pulse 75, temperature 97.8 F (36.6 C), temperature source Oral, resp. rate 18, height 5\' 8"  (1.727 m), weight 71 kg, SpO2 96 %.  Medical Problem List and Plan: 1. Functional  deficits secondary to debility  -patient may shower  -ELOS/Goals: Mod A in 2 weeks.   Admit to CIR today.  2.  Antithrombotics: -DVT/anticoagulation:  Pharmaceutical: Lovenox  -antiplatelet therapy: N/A 3. Pain Management: Tylenol prn.  4. Mood: LCSW to follow for evaluation and support.   -antipsychotic agents: N/A 5. Neuropsych: This patient is not capable of making decisions on his own behalf. 6. Skin/Wound Care: Routine pressure relief measures  --air mattress. Prevalon boots and foam dressing on ankles.  7. Fluids/Electrolytes/Nutrition:  Monitor renal status with routine checks  --pre-renal azotemia has improved with increase in water flushes 8. Covid 19: Treated with Remdesvir and steroids.  9. Delirium: Has been ongoing since Sunday. He has not slept for 2 days per nursing with bouts of hallucinations.   --also exacerbated by steroids and recent infection.   --will order sleep wake chart. Consistent routine.  --Monitor for hospital psychosis with prolonged LOS.  Add seroquel HS.  10. Insomnia: Will order sleep chart. Continue low dose Seroquel --Had trazodone and Seroquel ineffective last night  --will add melatonin as sedated this am.  11. Dysphagia: Continue NPO with ice chips after oral care.  --Nephro TID with water  flushes qid.  12. GIB: H/H stable. Continue Iron supplement.  --on Carafate and protonix 13. CAF/CHB  s/p PPM: Monitor HR TID.  14. Constipation: Continue Senna S at bedtime.  15. Acute on chronic renal failure: Improved and likely at baseline.  16. Tremors: d/c trazodone since ineffective anyway.     I have personally performed a face to face diagnostic evaluation, including, but not limited to relevant history and physical exam findings, of this patient and developed relevant assessment and plan.  Additionally, I have reviewed and concur with the physician assistant's documentation above.  Bary Leriche, PA-C   Izora Ribas, MD 04/05/2021

## 2021-04-05 NOTE — H&P (Shared)
Physical Medicine and Rehabilitation Admission H&P    CC: Debility   HPI: Gregory [name] is a 88 year old male with history of a flutter s/p ablation W-PPM, HTN, neuropathy, HOH, dysphagia with weight loss status post G-tube placement at [facility] 02/10/2021 and who was recently admitted with acute GI bleed on 03/02/2021.  He was treated with IV fluids as well as 2 units PRBCs as well as PPI.  Eliquis was discontinued and conservative care recommended by Dr. Kathryne Sharper.  Patient was noted to have functional decline and was admitted to CIR on 03/10/21  GI and cardiology were consulted for input on Eliquis and recommended discontinuation due to risk of ongoing bleeding with heme positive stools and variable H&H.  He was kept n.p.o. as MBS showed severe to moderate pharyngeal dysphagia with silent aspiration.  Hospital course was leukocytosis due to citrobacter amalnaticus UTI which was treated with cefepime however patient developed side effect of myoclonus therefore this was discontinued.  He was treated with Septra x4 days with some worsening of his renal status and was hydrated with IV fluids.    He did develop malaise, productive cough and fever on 02/16.  He was found to be COVID-positive and transferred to acute care for treatment.  He was treated with remdesivir and steroids however has had issues with confusion and hallucination for past 3 days due to delirium and/or steroid psychosis.  He did have episode of nose bleed that resolved with brief  packing. He has also had insomnia X 2 days which has exacerbated his confusion.    Respiratory status has been stable, he has been afebrile and has completed COVID 19 treatment course.  He continues to be deconditioned and CIR was consulted to resume his rehab course.     Review of Systems  Unable to perform ROS: Mental acuity  Neurological:  Positive for weakness.  Psychiatric/Behavioral:  The patient has insomnia.     Past Medical History:   Diagnosis Date   Atrial flutter [facility])    s/p ablation   Cancer [facility])    skin cancer   Cataracts, bilateral    Hypertension    Neuromuscular disorder ([place])    Pacemaker 2010   Thyroid disease     Past Surgical History:  Procedure Laterality Date   APPENDECTOMY     COLONOSCOPY     EYE SURGERY Bilateral    cataract surgery   HERNIA REPAIR Right    INGUINAL HERNIA REPAIR Left 10/12/2013   Procedure: LEFT  INGUINAL HERNIA REPAIR;  Surgeon: Odis Hollingshead, MD;  Location: [place];  Service: General;  Laterality: Left;   INSERTION OF MESH Left 10/12/2013   Procedure: INSERTION OF MESH;  Surgeon: Odis Hollingshead, MD;  Location: [place];  Service: General;  Laterality: Left;   PEG TUBE PLACEMENT Left     Family History  Problem Relation Age of Onset   Heart disease Mother    Cancer Father        leukemia    Social History: Lives with son and was independent prior to decline last fall secondary to FTT. Per  reports that he has never smoked. He has never used smokeless tobacco. He reports that he does not drink alcohol and does not use drugs.   Allergies  Allergen Reactions   Cefepime Other (See Comments)    Severe myoclonus for cefepime- don't take again-    Amlodipine Besylate     Other reaction(s): edema   Penicillins Hives  and Rash    Medications Prior to Admission  Medication Sig Dispense Refill   acetaminophen (TYLENOL) 325 MG tablet Place 1-2 tablets (325-650 mg total) into feeding tube every 4 (four) hours as needed for mild pain.     atorvastatin (LIPITOR) 40 MG tablet Place 1 tablet (40 mg total) into feeding tube at bedtime.     ELIQUIS 2.5 MG TABS tablet TAKE 1 TABLET BY MOUTH TWICE A DAY 180 tablet 1   guaiFENesin-dextromethorphan (ROBITUSSIN DM) 100-10 MG/5ML syrup Take 5 mLs by mouth 2 (two) times daily. 118 mL 0   levothyroxine (SYNTHROID) 75 MCG tablet Place 1 tablet (75 mcg total) into feeding tube daily.     Nutritional Supplements (NUTREN 1.5 EN) 250 mLs  by Enteral route in the morning, at noon, in the evening, and at bedtime.     polyethylene glycol (MIRALAX) 17 g packet Place 17 g into feeding tube daily as needed for mild constipation or moderate constipation. 14 each 0   senna (SENOKOT) 8.6 MG TABS tablet Place 2 tablets (17.2 mg total) into feeding tube daily. 120 tablet 0   sucralfate (CARAFATE) 1 GM/10ML suspension Place 1 g into feeding tube every 6 (six) hours.        Home: Home Living Family/patient expects to be discharged to:: Private residence Living Arrangements: Children Available Help at Discharge: Family, Available 24 hours/day Type of Home: House Home Access: Stairs to enter [employer] of Steps: 2 Entrance Stairs-Rails: None Home Layout: Two level, Able to live on main level with bedroom/bathroom Alternate Level Stairs-Rails: Right Bathroom Shower/Tub: Multimedia programmer: Handicapped height Bathroom Accessibility: Yes Home Equipment: [place] - single point, Grab bars - tub/shower, Grab bars - toilet, Hand held shower head, Shower seat Additional Comments: Son lives at home with Pt. and can be with him 24/7  Lives With: Son   Functional History: Prior Function Prior Level of Function : Needs assist Mobility Comments: since AIR admission, requiring min assist to walk 10 ft with RW ADLs Comments: Does bird bathing on first floor; but handicapped accessible bathroom is upstairs  Functional Status:  Mobility: Bed Mobility Overal bed mobility: Needs Assistance Bed Mobility: Supine to Sit, Sit to Supine Rolling: Mod assist Sidelying to sit: +2 for physical assistance, Max assist Supine to sit: Mod assist Sit to supine: Max assist Sit to sidelying: Max assist, +2 for physical assistance General bed mobility comments: Able to assist in advancing LEs to EOB, automatically pulling on bedrail to assist. With scooting of hips towards EOB, pt with sudden posterior lean due to fear of "sliding off of  bed". With OT pushing B feet to ground, pt felt more secure and about to sit upright. Max A to get B LE in bed and repositioning trunk. Transfers Overall transfer level: Needs assistance Equipment used: Ambulation equipment used Transfers: Sit to/from Stand Sit to Stand: Min assist General transfer comment: with max cues to set up [place] (placing feet on foot plate, hand placement), pt actually able to pull self up with Min A (x2 trials). Able to stand approx 2 min in [place] and pull self even taller on first trial. Ambulation/Gait General Gait Details: unsafe to attempt with posterior-right lean in standing Pre-gait activities: unable to clear feet in standing to side step toward [facility]    ADL: ADL Overall ADL's : Needs assistance/impaired Eating/Feeding: NPO Eating/Feeding Details (indicate cue type and reason): except for ice chips after mouth care Grooming: Supervision/safety, Bed level, Wash/dry face Grooming Details (  indicate cue type and reason): cues to attend to task, thoroughness, assist to doff/don glasses due to tremors Upper Body Bathing: Minimal assistance, Bed level Lower Body Bathing: Total assistance, Bed level Upper Body Dressing : Total assistance, Bed level Lower Body Dressing: Total assistance, Bed level General ADL Comments: Focus on postural control/breaking posterior lean and progression of standing  Cognition: Cognition Overall Cognitive Status: Impaired/Different from baseline Orientation Level: Oriented to person, Disoriented to place, Disoriented to time, Disoriented to situation Cognition Arousal/Alertness: Awake/alert Behavior During Therapy: WFL for tasks assessed/performed Overall Cognitive Status: Impaired/Different from baseline Area of Impairment: Orientation, Attention, Following commands, Safety/judgement, Awareness, Problem solving Orientation Level: Situation Current Attention Level: Sustained Following Commands: Follows one step commands with  increased time Safety/Judgement: Decreased awareness of safety, Decreased awareness of deficits Awareness: Intellectual Problem Solving: Slow processing, Difficulty sequencing, Requires verbal cues, Requires tactile cues General Comments: pt pleasant, tangential conversation (hard to understand at times due to soft spoken); follows directions consistently, benefits from slow direction due to anxiety/fear of falling. variable appropriate responses in conversation; often refers to ladder up on the ceiling yesterday?   Blood pressure (!) 119/35, pulse 96, temperature 97.8 F (36.6 C), temperature source Oral, resp. rate 19, height 5\' 8"  (1.727 m), weight 71 kg, SpO2 92 %. Physical Exam Vitals and nursing note reviewed.  Constitutional:      Appearance: Normal appearance.  Skin:    Comments: Bilateral great toes with dark eschar.   Neurological:     Mental Status: He is alert.     Comments: HOH. Soft voice. Alert and oriented to self only. Was able to recall place after cues. 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 Occasional tremors BUE.     Results for orders placed or performed during the hospital encounter of 03/31/21 (from the past 48 hour(s))  Glucose, capillary     Status: Abnormal   Collection Time: 04/03/21  6:07 PM  Result Value Ref Range   Glucose-Capillary 103 (H) 70 - 99 mg/dL    Comment: Glucose reference range applies only to samples taken after fasting for at least 8 hours.  Occult blood card to lab, stool RN will collect     Status: None   Collection Time: 04/03/21  8:50 PM  Result Value Ref Range   Fecal Occult Bld NEGATIVE NEGATIVE    Comment: Performed at [facility], [address], [place], [place] [address]  Magnesium     Status: None   Collection Time: 04/04/21  1:07 AM  Result Value Ref Range   Magnesium 1.8 1.7 - 2.4 mg/dL    Comment: Performed at [facility] [address], [place], [place] [address]   Basic metabolic panel     Status: Abnormal   Collection Time: 04/04/21  1:07 AM  Result Value Ref Range   Sodium 140 135 - 145 mmol/L   Potassium 3.2 (L) 3.5 - 5.1 mmol/L   Chloride 103 98 - 111 mmol/L   CO2 29 22 - 32 mmol/L   Glucose, Bld 95 70 - 99 mg/dL    Comment: Glucose reference range applies only to samples taken after fasting for at least 8 hours.   BUN 45 (H) 8 - 23 mg/dL   Creatinine, Ser 1.15 0.61 - 1.24 mg/dL   Calcium 8.9 8.9 - 10.3 mg/dL   GFR, Estimated 58 (L) >60 mL/min    Comment: (NOTE) Calculated using the CKD-EPI Creatinine Equation (2021)  Anion gap 8 5 - 15    Comment: Performed at [facility] [address], [place], [place] [address]  CBC with Differential/Platelet     Status: Abnormal   Collection Time: 04/04/21  1:07 AM  Result Value Ref Range   WBC 10.3 4.0 - 10.5 K/uL   RBC 2.67 (L) 4.22 - 5.81 MIL/uL   Hemoglobin 8.5 (L) 13.0 - 17.0 g/dL   HCT 26.0 (L) 39.0 - 52.0 %   MCV 97.4 80.0 - 100.0 fL   MCH 31.8 26.0 - 34.0 pg   MCHC 32.7 30.0 - 36.0 g/dL   RDW 17.5 (H) 11.5 - 15.5 %   Platelets 155 150 - 400 K/uL   nRBC 0.0 0.0 - 0.2 %   Neutrophils Relative % 67 %   Neutro Abs 6.8 1.7 - 7.7 K/uL   Lymphocytes Relative 21 %   Lymphs Abs 2.2 0.7 - 4.0 K/uL   Monocytes Relative 12 %   Monocytes Absolute 1.2 (H) 0.1 - 1.0 K/uL   Eosinophils Relative 0 %   Eosinophils Absolute 0.0 0.0 - 0.5 K/uL   Basophils Relative 0 %   Basophils Absolute 0.0 0.0 - 0.1 K/uL   Immature Granulocytes 0 %   Abs Immature Granulocytes 0.03 0.00 - 0.07 K/uL    Comment: Performed at [facility], [place] [address] [place], [place] [address]  Glucose, capillary     Status: None   Collection Time: 04/04/21  8:05 AM  Result Value Ref Range   Glucose-Capillary 92 70 - 99 mg/dL    Comment: Glucose reference range applies only to samples taken after fasting for at least 8 hours.  Glucose, capillary     Status: Abnormal   Collection Time: 04/05/21 12:02 AM   Result Value Ref Range   Glucose-Capillary 131 (H) 70 - 99 mg/dL    Comment: Glucose reference range applies only to samples taken after fasting for at least 8 hours.  Magnesium     Status: None   Collection Time: 04/05/21  3:19 AM  Result Value Ref Range   Magnesium 2.3 1.7 - 2.4 mg/dL    Comment: Performed at [facility] [address], [place], [place] [address]  Basic metabolic panel     Status: Abnormal   Collection Time: 04/05/21  3:19 AM  Result Value Ref Range   Sodium 139 135 - 145 mmol/L   Potassium 3.9 3.5 - 5.1 mmol/L    Comment: DELTA CHECK NOTED   Chloride 102 98 - 111 mmol/L   CO2 25 22 - 32 mmol/L   Glucose, Bld 132 (H) 70 - 99 mg/dL    Comment: Glucose reference range applies only to samples taken after fasting for at least 8 hours.   BUN 35 (H) 8 - 23 mg/dL   Creatinine, Ser 1.08 0.61 - 1.24 mg/dL   Calcium 8.9 8.9 - 10.3 mg/dL   GFR, Estimated >60 >60 mL/min    Comment: (NOTE) Calculated using the CKD-EPI Creatinine Equation (2021)    Anion gap 12 5 - 15    Comment: Performed at [facility] [address] [place] [address]  CBC     Status: Abnormal   Collection Time: 04/05/21  3:19 AM  Result Value Ref Range   WBC 10.8 (H) 4.0 - 10.5 K/uL   RBC 2.89 (L) 4.22 - 5.81 MIL/uL   Hemoglobin 9.0 (L) 13.0 - 17.0 g/dL   HCT 28.3 (L)  39.0 - 52.0 %   MCV 97.9 80.0 - 100.0 fL   MCH 31.1 26.0 - 34.0 pg   MCHC 31.8 30.0 - 36.0 g/dL   RDW 17.5 (H) 11.5 - 15.5 %   Platelets 149 (L) 150 - 400 K/uL   nRBC 0.0 0.0 - 0.2 %    Comment: Performed at [facility] [address], [place], [place] [address]  Glucose, capillary     Status: Abnormal   Collection Time: 04/05/21  6:26 AM  Result Value Ref Range   Glucose-Capillary 103 (H) 70 - 99 mg/dL    Comment: Glucose reference range applies only to samples taken after fasting for at least 8 hours.  Glucose, capillary     Status: Abnormal   Collection Time: 04/05/21 11:49 AM  Result Value  Ref Range   Glucose-Capillary 108 (H) 70 - 99 mg/dL    Comment: Glucose reference range applies only to samples taken after fasting for at least 8 hours.   No results found.    Blood pressure (!) 119/35, pulse 96, temperature 97.8 F (36.6 C), temperature source Oral, resp. rate 19, height 5\' 8"  (1.727 m), weight 71 kg, SpO2 92 %.  Medical Problem List and Plan: 1. Functional deficits secondary to ***  -patient may *** shower  -ELOS/Goals: *** 2.  Antithrombotics: -DVT/anticoagulation:  Pharmaceutical: Lovenox  -antiplatelet therapy: N/A 3. Pain Management: Tylenol prn.  4. Mood: LCSW to follow for evaluation and support.   -antipsychotic agents: N/A 5. Neuropsych: This patient is not capable of making decisions on his own behalf. 6. Skin/Wound Care: Routine pressure relief measures  --air mattress. Prevalon boots and foam dressing on ankles.  7. Fluids/Electrolytes/Nutrition:  Monitor renal status with routine checks  --pre-renal azotemia has improved with increase in water flushes 8. Covid 19: Treated with Remdesvir and steroids.  9. Delirium: Has been ongoing since Sunday. He has not slept for 2 days per nursing with bouts of hallucinations.   --also exacerbated by steroids and recent infection.   --will order sleep wake chart. Consistent routine.  --Monitor for hospital psychosis with prolonged LOS.  10. Insomnia: Will order sleep chart. Continue low dose Seroquel --Had trazodone and Seroquel ineffective last night  --will add melatonin as sedated this am.  11. Dysphagia: Continue NPO with ice chips after oral care.  --Nephro TID with water flushes qid.  12. GIB: H/H stable. Continue Iron supplement.  --on Carafate and protonix 13. CAF/CHB  s/p PPM: Monitor HR TID.  14. Constipation: Continue Senna S at bedtime.  15. Acute on chronic renal failure: Improved and likely at baseline.       ***  Bary Leriche, PA-C 04/05/2021

## 2021-04-05 NOTE — Progress Notes (Signed)
Tallo transferred to CIR - 40M.

## 2021-04-05 NOTE — Progress Notes (Signed)
Inpatient Rehabilitation Admission Medication Review by a Pharmacist  A complete drug regimen review was completed for this patient to identify any potential clinically significant medication issues.  High Risk Drug Classes Is patient taking? Indication by Medication  Antipsychotic Yes Compazine, seroquel- N/V/Sleep  Anticoagulant Yes Lovenox- VTE prophylaxis  Antibiotic No   Opioid No   Antiplatelet No   Hypoglycemics/insulin No   Vasoactive Medication No   Chemotherapy No   Other Yes Lipitor- HLD Synthroid- hypothyroidism Melatonin- sleep Protonix- GERD/GIB K-phos- electrolyte supplementation Sucralfate- peptic ulcer/GIB     Type of Medication Issue Identified Description of Issue Recommendation(s)  Drug Interaction(s) (clinically significant)     Duplicate Therapy     Allergy     No Medication Administration End Date     Incorrect Dose     Additional Drug Therapy Needed     Significant med changes from prior encounter (inform family/care partners about these prior to discharge).    Other  PTA meds: Eliquis Vitamins D3, B-12, C Eliquis- family along with cardiology agree to discontinue this medication given patient's recent GIB and age.   Restart other PTA meds (vitamins) at time of discharge, please    Clinically significant medication issues were identified that warrant physician communication and completion of prescribed/recommended actions by midnight of the next day:  No  Time spent performing this drug regimen review (minutes):  30   Annalei Friesz BS, PharmD, BCPS Clinical Pharmacist 04/05/2021 2:35 PM

## 2021-04-05 NOTE — Progress Notes (Signed)
Occupational Therapy Treatment Patient Details Name: Gregory [name] MRN: [id number] DOB: 11-20-1920 Today's Date: 04/05/2021   History of present illness 88 y.o. male with medical history significant of  A Fib s/p ablation, CHB s/p PPM, HTN,  HLD, hypothyroidism, neuropathy, lung nodule, TVAR, dysphagia and malnutrition s/p G tube (placed in [place] 02/10/21), CKD 3 stage who was admitted on 03/02/21 with hypotension, dizziness, near syncope and hgb 5.9. +GI bleed; transfer to [facility] with return to acute hospital 03/31/21 due to fever, weakness, COVID+   OT comments  Pt progressing gradually, pleasantly confused and benefits from slower, step by step instruction to combat fear of falling. Session focused on body mechanics and trial of Stedy to break posterior lean tendencies. Pt continues to require Max A for bed mobility but able to stand in [place] with Min A for brief periods. Confusion still evident though appears improved from yesterday per staff reports. Pt's son present and excited about pt plan to DC back to [facility] for continued rehab.    Recommendations for follow up therapy are one component of a multi-disciplinary discharge planning process, led by the attending physician.  Recommendations may be updated based on patient status, additional functional criteria and insurance authorization.    Follow Up Recommendations  Acute inpatient rehab (3hours/day)    Assistance Recommended at Discharge Frequent or constant Supervision/Assistance  Patient can return home with the following  Two people to help with walking and/or transfers;A lot of help with bathing/dressing/bathroom;Assistance with cooking/housework;Assist for transportation;Help with stairs or ramp for entrance;Direct supervision/assist for financial management;Direct supervision/assist for medications management   Equipment Recommendations  Other (comment) (defer to next venue)    Recommendations for Other Services Rehab consult     Precautions / Restrictions Precautions Precautions: Fall Precaution Comments: NPO with PEG tube, posterior lean Restrictions Weight Bearing Restrictions: No       Mobility Bed Mobility Overal bed mobility: Needs Assistance Bed Mobility: Supine to Sit, Sit to Supine     Supine to sit: Mod assist Sit to supine: Max assist   General bed mobility comments: Able to assist in advancing LEs to EOB, automatically pulling on bedrail to assist. With scooting of hips towards EOB, pt with sudden posterior lean due to fear of "sliding off of bed". With OT pushing B feet to ground, pt felt more secure and about to sit upright. Max A to get B LE in bed and repositioning trunk.    Transfers Overall transfer level: Needs assistance Equipment used: Ambulation equipment used Transfers: Sit to/from Stand Sit to Stand: Min assist           General transfer comment: with max cues to set up Stedy (placing feet on foot plate, hand placement), pt actually able to pull self up with Min A (x2 trials). Able to stand approx 2 min in [place] and pull self even taller on first trial.     Balance Overall balance assessment: Needs assistance Sitting-balance support: No upper extremity supported, Feet supported Sitting balance-Leahy Scale: Fair Sitting balance - Comments: initially with posterior lean that improved with OT pushing B feet to touch floor Postural control: Posterior lean Standing balance support: Bilateral upper extremity supported, During functional activity Standing balance-Leahy Scale: Poor                             ADL either performed or assessed with clinical judgement   ADL Overall ADL's : Needs assistance/impaired  Grooming: Supervision/safety;Bed level;Wash/dry face Grooming Details (indicate cue type and reason): cues to attend to task, thoroughness, assist to doff/don glasses due to tremors             Lower Body Dressing: Total assistance;Bed level                  General ADL Comments: Focus on postural control/breaking posterior lean and progression of standing    Extremity/Trunk Assessment Upper Extremity Assessment Upper Extremity Assessment: Generalized weakness   Lower Extremity Assessment Lower Extremity Assessment: Defer to PT evaluation        Vision   Vision Assessment?: No apparent visual deficits   Perception     Praxis      Cognition Arousal/Alertness: Awake/alert Behavior During Therapy: WFL for tasks assessed/performed Overall Cognitive Status: Impaired/Different from baseline Area of Impairment: Orientation, Attention, Following commands, Safety/judgement, Awareness, Problem solving                 Orientation Level: Situation Current Attention Level: Sustained   Following Commands: Follows one step commands with increased time Safety/Judgement: Decreased awareness of safety, Decreased awareness of deficits Awareness: Intellectual Problem Solving: Slow processing, Difficulty sequencing, Requires verbal cues, Requires tactile cues General Comments: pt pleasant, tangential conversation (hard to understand at times due to soft spoken); follows directions consistently, benefits from slow direction due to anxiety/fear of falling. variable appropriate responses in conversation; often refers to ladder up on the ceiling yesterday?        Exercises      Shoulder Instructions       General Comments Son, [name], in and out during session    Pertinent Vitals/ Pain       Pain Assessment Pain Assessment: No/denies pain  Home Living                                          Prior Functioning/Environment              Frequency  Min 2X/week        Progress Toward Goals  OT Goals(current goals can now be found in the care plan section)  Progress towards OT goals: Progressing toward goals  Acute Rehab OT Goals Patient Stated Goal: family would like for pt to go back to  [facility] OT Goal Formulation: With patient/family Time For Goal Achievement: 04/15/21 Potential to Achieve Goals: Good ADL Goals Pt Will Perform Grooming: with set-up;with supervision;sitting Pt Will Perform Upper Body Bathing: with set-up;with supervision;sitting Pt Will Perform Upper Body Dressing: with min assist;sitting Pt Will Perform Lower Body Dressing: with min assist;sit to/from stand Pt Will Transfer to Toilet: with min assist;stand pivot transfer;squat pivot transfer;bedside commode Pt Will Perform Toileting - Clothing Manipulation and hygiene: with min assist;sitting/lateral leans;sit to/from stand Additional ADL Goal #1: Pt will be min A in and OOB for basic ADLs  Plan Discharge plan remains appropriate    Co-evaluation                 AM-PAC OT "6 Clicks" Daily Activity     Outcome Measure   Help from another person eating meals?: Total (NPO) Help from another person taking care of personal grooming?: A Little Help from another person toileting, which includes using toliet, bedpan, or urinal?: Total Help from another person bathing (including washing, rinsing, drying)?: A Lot Help from another person to put on and taking  off regular upper body clothing?: A Lot Help from another person to put on and taking off regular lower body clothing?: Total 6 Click Score: 10    End of Session    OT Visit Diagnosis: Unsteadiness on feet (R26.81);Other abnormalities of gait and mobility (R26.89);Muscle weakness (generalized) (M62.81);Other symptoms and signs involving cognitive function   Activity Tolerance Patient tolerated treatment well   Patient Left in bed;with call bell/phone within reach;with family/visitor present   Nurse Communication          Time: 2230-0979 OT Time Calculation (min): 42 min  Charges: OT General Charges $OT Visit: 1 Visit OT Treatments $Self Care/Home Management : 8-22 mins $Therapeutic Activity: 23-37 mins  Malachy Chamber, OTR/L Acute Rehab  Services Office: [place] 04/05/2021, 11:06 AM

## 2021-04-05 NOTE — Discharge Instructions (Addendum)
You were admitted for COVID infection. We treated you for this and monitored you for improvement. You did well and that is why we transferred you to [facility].

## 2021-04-05 NOTE — Progress Notes (Signed)
Inpatient Rehab Admissions Coordinator:   I have a [facility] bed for this Pt. Today and plan to admit. MD to enter d/c orders and discontinue COVID isolation prior to transfer. MD may call report to [phone].  Clemens Catholic, [place], [place] Admissions Coordinator  [phone] ([place]) [phone] (office)

## 2021-04-05 NOTE — PMR Pre-admission (Shared)
PMR Admission Coordinator Pre-Admission Assessment   Patient: Gregory [name] is an 88 y.o., male MRN: [id number] DOB: Mar 28, 1920 Height: '5\' 8"'  (172.7 cm) Weight:  (unable to weigh in bed)   Insurance Information HMO:     PPO:      PCP:      IPA:      80/20:      OTHER:  PRIMARY: Medicare       Policy#:   [id number]    Subscriber: Pt. Phone#: Verified online    Fax#:  Pre-Cert#:       Employer:  Benefits:  Phone #:      Name:  Eff. Date: Parts A ad B effective  09/13/87 Deduct: $1600      Out of Pocket Max:  None      Life Max: N/A  CIR: 100%      SNF: 100 days Outpatient: 80%     Co-Pay: 20% Home Health: 100%      Co-Pay: none DME: 80%     Co-Pay: 20% Providers: patient's choice Providers: in network SECONDARY: [place]      Policy#: [id number]     Phone#:                Financial Counselor:       Phone#:    The Actuary for patients in Inpatient Rehabilitation Facilities with attached Privacy Act [name] Records was provided and verbally reviewed with: Patient and Family   Emergency Contact Information Contact Information       Name Relation Home Work Mobile    [name] Daughter     [name]     [phone]           Current Medical History  Patient Admitting Diagnosis: GI Bleed, [name], COVID 19   History of Present Illness: Gregory [name] is a 88 year old person with history of aortic stenosis status post prosthetic valve replacement in 2020, paroxysmal A. fib on Eliquis, complete heart block status post permanent pacemaker implantation, oropharyngeal dysphagia status post PEG tube placement in December 2022, hyperlipidemia, hypothyroidism, and prior COVID infection in June 2022 who presented  to the ED 03/02/20 for complaints of dizziness and near syncope.   Patient recently hospitalized at the [facility] in [place] for PEG placement secondary to dysphagia (pt. Had a choking episode at GI  office, swallow study performed at [facility] revealed no aspiration) in December.  No p.o. intake since that time.  During hospitalization for PEG tube placement appears that hemoglobin dropped below 7 several times requiring blood transfusions.  Patient denies dark, black, or bloody stools although notes that he has not had a bowel movement in about 2 weeks since discharge from previous hospitalization until yesterday when he had 2 movements.  On initial evaluation in the emergency department patient was hypotensive with a MAP as low as 53.  Heart rate was in the 70s.  Tachypneic although on room air.  Patient received 2 L IV fluids with improvement in blood pressure.  Labs notable for hemoglobin of 5.9, mild leukocytosis to 12.3, and normal platelets.  Patient received 2 units of blood.  GI was consulted and recommended IV PPI, n.p.o. and transfusions for hemoglobin goal greater than 7.  GI team concerned about patient's high risk for undergoing anesthesia or endoscopic procedures at this time. Overnight patient continued to be intermittently hypotensive.  Received an additional 1 L of IV fluids and 1 unit of blood for hemoglobin  of 6.9.  Ferritin elevated, B12 folate normal.  Hypoproliferative response on reticulocyte index. GI consulted and felt that had an UGI bleed, but recommended against anaesthesia and endoscopic procedures due to advanced age and low Hgb. Pt. Received multiple units of PRBCs and CIR was consulted to assist return to PLOF. Pt. Was admitted to [facility] 03/10/2021 where he worked with therapies and was nearing his discharge date. He began having respiratory symptoms and tested positive for COVID 03/31/21. He was transferred to acute hospital for medical management. Pt. Was stabilized on room air and seen by PT/OT/SLP to recommended return to CIR.    Patient's medical record from [facility]  has been reviewed by the rehabilitation admission coordinator and physician.   Past Medical  History      Past Medical History:  Diagnosis Date   Atrial flutter [facility])      s/p ablation   Cancer [facility])      skin cancer   Cataracts, bilateral     Hypertension     Neuromuscular disorder [facility])     Pacemaker 2010   Thyroid disease        Has the patient had major surgery during 100 days prior to admission? Yes   Family History   family history includes Cancer in his father; Heart disease in his mother.   Current Medications   Current Facility-Administered Medications:    acetaminophen (TYLENOL) tablet 325-650 mg, 325-650 mg, Per Tube, Q4H PRN, Orma Flaming, MD, 650 mg at 03/31/21 2329   alum & mag hydroxide-simeth (MAALOX/MYLANTA) 200-200-20 MG/5ML suspension 30 mL, 30 mL, Per Tube, Q4H PRN, Orma Flaming, MD   atorvastatin (LIPITOR) tablet 40 mg, 40 mg, Per Tube, Gerri Lins, MD, 40 mg at 04/02/21 2043   bisacodyl (DULCOLAX) suppository 10 mg, 10 mg, Rectal, Daily PRN, Orma Flaming, MD   chlorhexidine (PERIDEX) 0.12 % solution 15 mL, 15 mL, Mouth Rinse, BID, Orma Flaming, MD, 15 mL at 04/03/21 0945   diphenhydrAMINE (BENADRYL) 12.5 MG/5ML elixir 12.5-25 mg, 12.5-25 mg, Per Tube, Q6H PRN, Orma Flaming, MD   enoxaparin (LOVENOX) injection 30 mg, 30 mg, Subcutaneous, Q24H, Rick Duff, MD, 30 mg at 04/03/21 0946   feeding supplement (NEPRO CARB STEADY) liquid 315 mL, 315 mL, Per Tube, TID, Lucious Groves, DO, Last Rate: 315 mL/hr at 04/03/21 0946, 315 mL at 04/03/21 1316   ferrous sulfate 220 (44 Fe) MG/5ML solution 220 mg, 220 mg, Per Tube, BID WC, Orma Flaming, MD, 220 mg at 04/03/21 0945   free water 300 mL, 300 mL, Per Tube, QID, Hoffman, Erik C, DO, 300 mL at 04/03/21 1316   guaiFENesin-dextromethorphan (ROBITUSSIN DM) 100-10 MG/5ML syrup 5-10 mL, 5-10 mL, Per Tube, Q6H PRN, Orma Flaming, MD, 5 mL at 04/01/21 0533   levothyroxine (SYNTHROID) tablet 75 mcg, 75 mcg, Per Tube, Q0600, Orma Flaming, MD, 75 mcg at 04/03/21 3086   lip balm (CARMEX)  ointment, , Topical, PRN, Orma Flaming, MD   MEDLINE mouth rinse, 15 mL, Mouth Rinse, QID, Orma Flaming, MD, 15 mL at 04/03/21 1331   pantoprazole sodium (PROTONIX) 40 mg/20 mL oral suspension 40 mg, 40 mg, Per Tube, BID AC, Orma Flaming, MD, 40 mg at 04/03/21 0945   phosphorus (K PHOS NEUTRAL) tablet 250 mg, 250 mg, Per Tube, Daily, Orma Flaming, MD, 250 mg at 04/03/21 0945   polyethylene glycol (MIRALAX / GLYCOLAX) packet 17 g, 17 g, Per Tube, Daily PRN, Orma Flaming, MD   prochlorperazine (COMPAZINE) tablet 5-10  mg, 5-10 mg, Per Tube, Q6H PRN **OR** prochlorperazine (COMPAZINE) injection 5-10 mg, 5-10 mg, Intramuscular, Q6H PRN **OR** prochlorperazine (COMPAZINE) suppository 12.5 mg, 12.5 mg, Rectal, Q6H PRN, Orma Flaming, MD   senna-docusate (Senokot-S) tablet 2 tablet, 2 tablet, Per Tube, Gerri Lins, MD, 2 tablet at 04/02/21 2043   sucralfate (CARAFATE) 1 GM/10ML suspension 1 g, 1 g, Per Tube, Q8H, Orma Flaming, MD, 1 g at 04/03/21 1316   traZODone (DESYREL) tablet 25-50 mg, 25-50 mg, Per Tube, QHS PRN, Orma Flaming, MD, 50 mg at 03/31/21 2329   Patients Current Diet:  Diet Order                  Diet NPO time specified Except for: Ice Chips  Diet effective now                         Precautions / Restrictions Precautions Precautions: Fall Precaution Comments: NPO with PEG tube, posterior lean Restrictions Weight Bearing Restrictions: No    Has the patient had 2 or more falls or a fall with injury in the past year? Yes   Prior Activity Level Community (5-7x/wk): Pt. went out often   Prior Functional Level Self Care: Did the patient need help bathing, dressing, using the toilet or eating? Independent   Indoor Mobility: Did the patient need assistance with walking from room to room (with or without device)? Independent   Stairs: Did the patient need assistance with internal or external stairs (with or without device)? Independent   Functional  Cognition: Did the patient need help planning regular tasks such as shopping or remembering to take medications? Independent   Patient Information Are you of Hispanic, Latino/a,or Spanish origin?: A. No, not of Hispanic, Latino/a, or Spanish origin What is your race?: A. White Do you need or want an interpreter to communicate with a doctor or health care staff?: 0. No   Patient's Response To:  Health Literacy and Transportation Is the patient able to respond to health literacy and transportation needs?: Yes Health Literacy - How often do you need to have someone help you when you read instructions, pamphlets, or other written material from your doctor or pharmacy?: Sometimes In the past 12 months, has lack of transportation kept you from medical appointments or from getting medications?: No In the past 12 months, has lack of transportation kept you from meetings, work, or from getting things needed for daily living?: No   Development worker, international aid / [place] Devices/Equipment: Other (Comment) (tube feeding supplies) Home Equipment: Cane - single point, Grab bars - tub/shower, Grab bars - toilet, Hand held shower head, Shower seat   Prior Device Use: Indicate devices/aids used by the patient prior to current illness, exacerbation or injury? Walker   Current Functional Level Cognition   Overall Cognitive Status: Within Functional Limits for tasks assessed Orientation Level: Disoriented to situation, Disoriented to time, Disoriented to place General Comments: WFL for simple tasks, oriented to self, month, year, location, not situation    Extremity Assessment (includes Sensation/Coordination)   Upper Extremity Assessment: Generalized weakness  Lower Extremity Assessment: Generalized weakness     ADLs   Overall ADL's : Needs assistance/impaired Eating/Feeding: NPO Eating/Feeding Details (indicate cue type and reason): except for ice chips after mouth care Grooming: Wash/dry  face, Set up, Bed level Upper Body Bathing: Minimal assistance, Bed level Lower Body Bathing: Total assistance, Bed level Upper Body Dressing : Total assistance, Bed level Lower Body  Dressing: Total assistance, Bed level     Mobility   Overal bed mobility: Needs Assistance Bed Mobility: Rolling, Sidelying to Sit, Sit to Sidelying Rolling: Mod assist Sidelying to sit: +2 for physical assistance, Max assist Sit to sidelying: Max assist, +2 for physical assistance General bed mobility comments: increased posterior lean requiring incr assist of 2 people     Transfers   Overall transfer level: Needs assistance Equipment used: 2 person hand held assist Transfers: Sit to/from Stand Sit to Stand: Max assist, +2 physical assistance, From elevated surface General transfer comment: from EOB (elevated) x 2 reps; each with significant posterior lean and feet blocked to prevent sliding; pt denies fear of falling forward     Ambulation / Gait / Stairs / Wheelchair Mobility   Ambulation/Gait Pre-gait activities: unable to clear feet in standing to side step toward [facility]     Posture / Balance Balance Overall balance assessment: Needs assistance Sitting-balance support: No upper extremity supported, Feet supported Sitting balance-Leahy Scale: Poor Postural control: Posterior lean Standing balance support: Bilateral upper extremity supported, During functional activity Standing balance-Leahy Scale: Poor Standing balance comment: Reliant on UE support     Special needs/care consideration Skin Abrasion: sacrum/mid; Blister; heel/left; Ecchymosis: arm/bilateral; Pressure injury: sacrum/mid; heel/right; heel/left; foot/right, lateral; foot/left, lateral; Wound: toe tips of great toes/right,left,bilateral External urinary catheter  and Special service needs PEG, bolus feeds    Previous Home Environment (from acute therapy documentation) Living Arrangements: Children  Lives With: Son Available Help at  Discharge: Family, Available 24 hours/day Type of Home: House Home Layout: Two level, Able to live on main level with bedroom/bathroom Alternate Level Stairs-Rails: Right Home Access: Stairs to enter Entrance Stairs-Rails: None Entrance Stairs-Number of Steps: 2 Bathroom Shower/Tub: Multimedia programmer: Handicapped height Bathroom Accessibility: Yes How Accessible: Accessible via wheelchair Home Care Services: Yes Type of Home Care Services: Homehealth aide Additional Comments: Son lives at home with Pt. and can be with him 24/7   Discharge Living Setting Plans for Discharge Living Setting: Patient's home Type of Home at Discharge: House Discharge Home Layout: Two level, Able to live on main level with bedroom/bathroom Alternate Level Stairs-Rails: Right Alternate Level Stairs-Number of Steps: full flight, chair lift Discharge Home Access: Stairs to enter Entrance Stairs-Rails: None Entrance Stairs-Number of Steps: 2 Discharge Bathroom Shower/Tub: Tub/shower unit Discharge Bathroom Toilet: Handicapped height Discharge Bathroom Accessibility: Yes How Accessible: Accessible via wheelchair Does the patient have any problems obtaining your medications?: No   Social/Family/Support Systems Patient Roles: Other (Comment) Contact Information: [phone] Anticipated Caregiver: Mke (son Ability/Limitations of Caregiver: Can provide mod A Caregiver Availability: 24/7 Discharge Plan Discussed with Primary Caregiver: Yes Is Caregiver In Agreement with Plan?: Yes Does Caregiver/Family have Issues with Lodging/Transportation while Pt is in Rehab?: No   Goals Patient/Family Goal for Rehab: PT/OT/SLP Mod A Expected length of stay: 12-14 days Pt/Family Agrees to Admission and willing to participate: Yes Program Orientation Provided & Reviewed with Pt/Caregiver Including Roles  & Responsibilities: Yes   Decrease burden of Care through IP rehab admission: Specialzed equipment  needs   Possible need for SNF placement upon discharge: not anticipated    Patient Condition: I have reviewed medical records from [facility], spoken with CM, and patient. I met with patient at the bedside for inpatient rehabilitation assessment.  Patient will benefit from ongoing PT, OT, and SLP, can actively participate in 3 hours of therapy a day 5 days of the week, and can make measurable gains  during the admission.  Patient will also benefit from the coordinated team approach during an Inpatient Acute Rehabilitation admission.  The patient will receive intensive therapy as well as Rehabilitation physician, nursing, social worker, and care management interventions.  Due to bladder management, bowel management, safety, skin/wound care, disease management, medication administration, pain management, and patient education the patient requires 24 hour a day rehabilitation nursing.  The patient is currently ** with mobility and basic ADLs.  Discharge setting and therapy post discharge at home with home health is anticipated.  Patient has agreed to participate in the Acute Inpatient Rehabilitation Program and will admit today.   Preadmission Screen Completed By:  Genella Mech, 04/03/2021 2:28 PM ______________________________________________________________________   Discussed status with Dr. Ranell Patrick  on 04/05/21 at 1000 and received approval for admission today.   Admission Coordinator:  Danise Edge, time 1012/Date 04/05/21    Assessment/Plan: Diagnosis: Debility Does the need for close, 24 hr/day Medical supervision in concert with the patient's rehab needs make it unreasonable for this patient to be served in a less intensive setting? Yes Co-Morbidities requiring supervision/potential complications: CBULA-45 infection, chronic atrial fibrillation s/p ablation, s/p TAVR, stage 3 chronic kidney disease, dysphagia s/p Gtube Due to bladder management, bowel management,  safety, skin/wound care, disease management, medication administration, pain management, and patient education, does the patient require 24 hr/day rehab nursing? Yes Does the patient require coordinated care of a physician, rehab nurse, PT, OT, and SLP to address physical and functional deficits in the context of the above medical diagnosis(es)? Yes Addressing deficits in the following areas: balance, endurance, locomotion, strength, transferring, bowel/bladder control, bathing, dressing, feeding, grooming, toileting, cognition, swallowing, and psychosocial support Can the patient actively participate in an intensive therapy program of at least 3 hrs of therapy 5 days a week? Yes The potential for patient to make measurable gains while on inpatient rehab is excellent Anticipated functional outcomes upon discharge from inpatient rehab: modA PT, mod assist OT, mod assist SLP Estimated rehab length of stay to reach the above functional goals is: 2 weeks Anticipated discharge destination: Home 10. Overall Rehab/Functional Prognosis: fair     MD Signature: Leeroy Cha, MD

## 2021-04-05 NOTE — Progress Notes (Signed)
Patient admitted to 4MW-10 at 1350. Patient oriented to unit and room. Family at bedside. Denies pain or needs at this time. Call bell within reach. Bed set to lowest setting. Will continue to monitor.

## 2021-04-05 NOTE — Plan of Care (Signed)
  Problem: Clinical Measurements: Goal: Diagnostic test results will improve Outcome: Progressing   Problem: Clinical Measurements: Goal: Respiratory complications will improve Outcome: Progressing   

## 2021-04-05 NOTE — Progress Notes (Signed)
Patient ID: Harout Scheurich, male   DOB: Jun 28, 1920, 88 y.o.   MRN: [id number]  SW familiar to patient, as he is a return patient. Pt was discharged to acute on 03/31/2021 as per EMR, He began having respiratory symptoms and tested positive for COVID 03/31/21. He was transferred to acute hospital for medical management. Please refer to assessment completed on 03/13/2021.  SW will follow-up with pt and family to continue to coordinate pt care needs. SW informed [name]/[facility] and [name] Chase/[facility] on pt return to unit and will f/u once there is a discharge date.   [name], MSW, [place] Office: [phone] Cell: [phone] Fax: [phone]

## 2021-04-06 DIAGNOSIS — R5381 Other malaise: Secondary | ICD-10-CM | POA: Diagnosis not present

## 2021-04-06 LAB — CBC WITH DIFFERENTIAL/PLATELET
Abs Immature Granulocytes: 0.03 10*3/uL (ref 0.00–0.07)
Basophils Absolute: 0 10*3/uL (ref 0.0–0.1)
Basophils Relative: 0 %
Eosinophils Absolute: 0.2 10*3/uL (ref 0.0–0.5)
Eosinophils Relative: 2 %
HCT: 24.4 % — ABNORMAL LOW (ref 39.0–52.0)
Hemoglobin: 8 g/dL — ABNORMAL LOW (ref 13.0–17.0)
Immature Granulocytes: 0 %
Lymphocytes Relative: 22 %
Lymphs Abs: 1.7 10*3/uL (ref 0.7–4.0)
MCH: 31.5 pg (ref 26.0–34.0)
MCHC: 32.8 g/dL (ref 30.0–36.0)
MCV: 96.1 fL (ref 80.0–100.0)
Monocytes Absolute: 0.8 10*3/uL (ref 0.1–1.0)
Monocytes Relative: 10 %
Neutro Abs: 5.3 10*3/uL (ref 1.7–7.7)
Neutrophils Relative %: 66 %
Platelets: 132 10*3/uL — ABNORMAL LOW (ref 150–400)
RBC: 2.54 MIL/uL — ABNORMAL LOW (ref 4.22–5.81)
RDW: 17.7 % — ABNORMAL HIGH (ref 11.5–15.5)
WBC: 8.1 10*3/uL (ref 4.0–10.5)
nRBC: 0 % (ref 0.0–0.2)

## 2021-04-06 LAB — MAGNESIUM: Magnesium: 2 mg/dL (ref 1.7–2.4)

## 2021-04-06 LAB — COMPREHENSIVE METABOLIC PANEL
ALT: 22 U/L (ref 0–44)
AST: 28 U/L (ref 15–41)
Albumin: 2.7 g/dL — ABNORMAL LOW (ref 3.5–5.0)
Alkaline Phosphatase: 50 U/L (ref 38–126)
Anion gap: 7 (ref 5–15)
BUN: 33 mg/dL — ABNORMAL HIGH (ref 8–23)
CO2: 28 mmol/L (ref 22–32)
Calcium: 8.5 mg/dL — ABNORMAL LOW (ref 8.9–10.3)
Chloride: 102 mmol/L (ref 98–111)
Creatinine, Ser: 1.19 mg/dL (ref 0.61–1.24)
GFR, Estimated: 55 mL/min — ABNORMAL LOW (ref >60)
Glucose, Bld: 103 mg/dL — ABNORMAL HIGH (ref 70–99)
Potassium: 3.3 mmol/L — ABNORMAL LOW (ref 3.5–5.1)
Sodium: 137 mmol/L (ref 135–145)
Total Bilirubin: 0.6 mg/dL (ref 0.3–1.2)
Total Protein: 5.7 g/dL — ABNORMAL LOW (ref 6.5–8.1)

## 2021-04-06 LAB — GLUCOSE, CAPILLARY
Glucose-Capillary: 101 mg/dL — ABNORMAL HIGH (ref 70–99)
Glucose-Capillary: 85 mg/dL (ref 70–99)
Glucose-Capillary: 116 mg/dL — ABNORMAL HIGH (ref 70–99)
Glucose-Capillary: 97 mg/dL (ref 70–99)

## 2021-04-06 MED ORDER — POTASSIUM CHLORIDE CRYS ER 20 MEQ PO TBCR
40.0000 meq | EXTENDED_RELEASE_TABLET | Freq: Once | ORAL | Status: AC
  Administered 2021-04-06: 40 meq via ORAL
  Filled 2021-04-06: qty 2

## 2021-04-06 NOTE — Evaluation (Signed)
Physical Therapy Assessment and Plan  Patient Details  Name: Gregory [name] MRN: [id number] Date of Birth: [birth year]  PT Diagnosis: Abnormality of gait, Difficulty walking, Impaired cognition, and Muscle weakness Rehab Potential: Good ELOS: ~2-2.5 weeks   Today's Date: 04/06/2021 PT Individual Time: 1107-1205 PT Individual Time Calculation (min): 58 min    Hospital Problem: Principal Problem:   Debility   Past Medical History:  Past Medical History:  Diagnosis Date   Atrial flutter ([place])    s/p ablation   Cancer ([place])    skin cancer   Cataracts, bilateral    Hypertension    Neuromuscular disorder ([place])    Pacemaker 2010   Thyroid disease    Past Surgical History:  Past Surgical History:  Procedure Laterality Date   APPENDECTOMY     COLONOSCOPY     EYE SURGERY Bilateral    cataract surgery   HERNIA REPAIR Right    INGUINAL HERNIA REPAIR Left 10/12/2013   Procedure: LEFT  INGUINAL HERNIA REPAIR;  Surgeon: Odis Hollingshead, MD;  Location: [place];  Service: General;  Laterality: Left;   INSERTION OF MESH Left 10/12/2013   Procedure: INSERTION OF MESH;  Surgeon: Odis Hollingshead, MD;  Location: [place];  Service: General;  Laterality: Left;   PEG TUBE PLACEMENT Left     Assessment & Plan Clinical Impression: Patient is a 88 year old male with history of a flutter s/p ablation W-PPM, HTN, neuropathy, HOH, dysphagia with weight loss status post G-tube placement at [facility] 02/10/2021 and who was recently admitted with acute GI bleed on 03/02/2021.  He was treated with IV fluids as well as 2 units PRBCs as well as PPI.  Eliquis was discontinued and conservative care recommended by Dr. Kathryne Sharper.  Patient was noted to have functional decline and was admitted to CIR on 03/10/21   GI and cardiology were consulted for input on Eliquis and recommended discontinuation due to risk of ongoing bleeding with heme positive stools and variable H&H.  He was kept n.p.o. as MBS showed severe to  moderate pharyngeal dysphagia with silent aspiration.  Hospital course was leukocytosis due to citrobacter amalnaticus UTI which was treated with cefepime however patient developed side effect of myoclonus therefore this was discontinued.  He was treated with Septra x4 days with some worsening of his renal status and was hydrated with IV fluids.     He did develop malaise, productive cough and fever on 02/16.  He was found to be COVID-positive and transferred to acute care for treatment.  He was treated with remdesivir and steroids however has had issues with confusion and hallucination for past 3 days due to delirium and/or steroid psychosis.  He did have episode of nose bleed that resolved with brief  packing. He has also had insomnia X 2 days which has exacerbated his confusion.    Respiratory status has been stable, he has been afebrile and has completed COVID 19 treatment course.  Patient transferred to CIR on 04/05/2021 .   Patient currently requires mod with mobility secondary to muscle weakness, decreased cardiorespiratoy endurance, impaired timing and sequencing, decreased awareness, decreased problem solving, decreased safety awareness, and decreased memory, and decreased sitting balance, decreased standing balance, decreased postural control, and decreased balance strategies.  Prior to hospitalization, patient was modified independent  with mobility and lived with Son in a House home.  Home access is 2Stairs to enter.  Patient will benefit from skilled PT intervention to maximize safe functional mobility, minimize fall risk,  and decrease caregiver burden for planned discharge home with 24 hour assist.  Anticipate patient will benefit from follow up [place] at discharge.  PT - End of Session Activity Tolerance: Tolerates 30+ min activity with multiple rests Endurance Deficit: Yes Endurance Deficit Description: Pt needing multiple reclined rest breaks during dynamic tasks PT Assessment Rehab  Potential (ACUTE/IP ONLY): Good PT Barriers to Discharge: Inaccessible home environment;Home environment access/layout;Nutrition means PT Patient demonstrates impairments in the following area(s): Balance;Endurance;Motor;Pain;Perception;Safety;Skin Integrity;Behavior PT Transfers Functional Problem(s): Bed Mobility;Bed to Chair;Car;Furniture PT Locomotion Functional Problem(s): Ambulation;Stairs PT Plan PT Intensity: Minimum of 1-2 x/day ,45 to 90 minutes PT Frequency: 5 out of 7 days PT Duration Estimated Length of Stay: ~2-2.5 weeks PT Treatment/Interventions: Ambulation/gait training;Community reintegration;DME/adaptive equipment instruction;Neuromuscular re-education;Psychosocial support;Stair training;UE/LE Strength taining/ROM;Balance/vestibular training;Discharge planning;Functional electrical stimulation;Pain management;Skin care/wound management;UE/LE Coordination activities;Cognitive remediation/compensation;Therapeutic Activities;Disease management/prevention;Functional mobility training;Patient/family education;Splinting/orthotics;Therapeutic Exercise;Visual/perceptual remediation/compensation PT Transfers Anticipated Outcome(s): CGA using LRAD PT Locomotion Anticipated Outcome(s): CGA using LRAD PT Recommendation Follow Up Recommendations: Home health PT;24 hour supervision/assistance Patient destination: Home Equipment Recommended: To be determined Equipment Details: has hospital bed, RW, and narrow based quad cane   PT Evaluation Precautions/Restrictions Precautions Precautions: Fall Precaution Comments: NPO with PEG tube, posterior lean Restrictions Weight Bearing Restrictions: No General Chart Reviewed: Yes Family/Caregiver Present: Yes  Pain Pain Assessment Pain Scale: PAINAD Pain Score: 0-No pain PAINAD (Pain Assessment in Advanced Dementia) Breathing: normal Negative Vocalization: none Facial Expression: smiling or inexpressive Body Language:  relaxed Consolability: no need to console PAINAD Score: 0 Pain Interference Pain Interference Pain Effect on Sleep: 1. Rarely or not at all Pain Interference with Therapy Activities: 1. Rarely or not at all Pain Interference with Day-to-Day Activities: 1. Rarely or not at all Home Living/Prior [place] Available Help at Discharge: Family;Available 24 hours/day Type of Home: House Home Access: Stairs to enter [employer] of Steps: 2 Entrance Stairs-Rails: None Home Layout: Two level;Able to live on main level with bedroom/bathroom Alternate Level Stairs-Rails: Right Additional Comments: Son lives at home with Pt. and can be with him 24/7  Lives With: Son Prior Function Level of Independence: Independent with basic ADLs;Requires assistive device for independence  Able to Take Stairs?: Yes Vocation: Retired Vision/Perception  Vision - History Ability to See in Adequate Light: 3 Highly impaired Perception Perception: Within Functional Limits Praxis Praxis: Impaired Praxis Impairment Details: Initiation;Motor planning  Cognition Orientation Level: Oriented to person Sensation Sensation Light Touch: Impaired Detail Peripheral sensation comments: impaired distally in feet bilaterally Light Touch Impaired Details: Impaired RLE;Impaired LLE Coordination Gross Motor Movements are Fluid and Coordinated: No Fine Motor Movements are Fluid and Coordinated: No Coordination and Movement Description: Affected by balance impairments and baseline tremulous activity in hands bilaterally Finger Nose Finger Test: tremulous bilaterally Motor  Motor Motor: Other (comment);Abnormal postural alignment and control Motor - Skilled Clinical Observations: generalized weakness and deconditioning with impaired balance   Trunk/Postural Assessment  Cervical Assessment Cervical Assessment:  (forward head) Thoracic Assessment Thoracic Assessment:  (rounded shoulders) Lumbar  Assessment Lumbar Assessment:  (posterior pelvic tilt) Postural Control Postural Control: Deficits on evaluation Righting Reactions: delayed and insufficient with posterior lean bias Protective Responses: delayed and insufficient with posterior lean bias  Balance Balance Balance Assessed: Yes Static Sitting Balance Static Sitting - Balance Support: Feet supported;Bilateral upper extremity supported Static Sitting - Level of Assistance: 4: Min assist (CGA) Dynamic Sitting Balance Dynamic Sitting - Balance Support: During functional activity Dynamic Sitting - Level of Assistance: 4: Min Insurance risk surveyor  Standing - Balance Support: Right upper extremity supported;Bilateral upper extremity supported Static Standing - Level of Assistance: 4: Min assist Dynamic Standing Balance Dynamic Standing - Balance Support: During functional activity;Bilateral upper extremity supported Dynamic Standing - Level of Assistance: 4: Min assist Extremity Assessment  RLE Assessment RLE Assessment: Exceptions to [facility] Active Range of Motion (AROM) Comments: WFL except tightness noted in heel cords General Strength Comments: strength grossly 4-/5 to 4/5 throughout observed functionally LLE Assessment LLE Assessment: Exceptions to [facility] Active Range of Motion (AROM) Comments: WFL except tightness noted in heel cords General Strength Comments: strength grossly 4-/5 to 4/5 throughout observed functionally  Care Tool Care Tool Bed Mobility Roll left and right activity   Roll left and right assist level: Moderate Assistance - Patient 50 - 74%    Sit to lying activity   Sit to lying assist level: Moderate Assistance - Patient 50 - 74%    Lying to sitting on side of bed activity   Lying to sitting on side of bed assist level: the ability to move from lying on the back to sitting on the side of the bed with no back support.: Moderate Assistance - Patient 50 - 74%     Care Tool  Transfers Sit to stand transfer   Sit to stand assist level: Moderate Assistance - Patient 50 - 74%    Chair/bed transfer   Chair/bed transfer assist level: Moderate Assistance - Patient 50 - 74%     Toilet transfer   Assist Level: Moderate Assistance - Patient 50 - 74%    Car transfer   Car transfer assist level: Moderate Assistance - Patient 50 - 74%      Care Tool Locomotion Ambulation   Assist level: 2 helpers Assistive device: Walker-rolling Max distance: 150'  Walk 10 feet activity   Assist level: 2 helpers Assistive device: Walker-rolling   Walk 50 feet with 2 turns activity   Assist level: 2 helpers Assistive device: Walker-rolling  Walk 150 feet activity   Assist level: 2 helpers Assistive device: Walker-rolling  Walk 10 feet on uneven surfaces activity   Assist level: 2 helpers Assistive device: IT consultant level: Minimal Assistance - Patient > 75% Stairs assistive device: 2 hand rails Max number of stairs: 4  Walk up/down 1 step activity   Walk up/down 1 step (curb) assist level: Minimal Assistance - Patient > 75% Walk up/down 1 step or curb assistive device: 2 hand rails  Walk up/down 4 steps activity   Walk up/down 4 steps assist level: Minimal Assistance - Patient > 75% Walk up/down 4 steps assistive device: 2 hand rails  Walk up/down 12 steps activity Walk up/down 12 steps activity did not occur: Safety/medical concerns      Pick up small objects from floor Pick up small object from the floor (from standing position) activity did not occur: Safety/medical concerns      Wheelchair Is the patient using a wheelchair?: No          Wheel 50 feet with 2 turns activity      Wheel 150 feet activity        Refer to Care Plan for Long Term Goals  SHORT TERM GOAL WEEK 1 PT Short Term Goal 1 (Week 1): Pt will perform supine<>sit with min assist PT Short Term Goal 2 (Week 1): Pt will perform sit<>stand transfers using LRAD with min  assist PT Short Term Goal 3 (Week 1): Pt will perform stand pivot transfers  using LRAD with min assist PT Short Term Goal 4 (Week 1): Pt will ambulate at least 169f using LRAD with min assist  Recommendations for other services: None   Skilled Therapeutic Intervention  Evaluation completed (see details above and below) with education on PT POC and goals and individual treatment initiated with focus on bed mobility, balance, transfers, ambulation, and stair training. Pt received supine in bed and agrees to therapy. No complaint of pain. Supine to sit with modA and cues for hand placement, body mechanics, and sequencing. Pt performs sit to stand and [facility] and cues for sequencing, anterior trunk lean, and hand placement. WC transport to gym for time management. Pt performs sit to stand multiple reps throughout session with modA primarily to correct for posterior bias. Pt ambulates x150' with RW and minA +1 with +2 for WC follow for safety. Following extended seated rest break, pt completes x4 6" steps with bilateral hand rails and minA, with cues for sequencing. Extended seated rest break prior to completing car transfer with modA HHA with multimodal cues for sequencing and positioning. Pt completes ramp navigation with minA. Stand step transfer back to bed. Sit to supine with modA and cues for body mechanics. Left supine with mitts in place and son present.   Mobility Bed Mobility Bed Mobility: Supine to Sit;Sit to Supine Supine to Sit: Moderate Assistance - Patient 50-74% Sit to Supine: Moderate Assistance - Patient 50-74% Transfers Transfers: Sit to Stand;Stand to Sit;Stand Pivot Transfers Sit to Stand: Moderate Assistance - Patient 50-74% Stand to Sit: Moderate Assistance - Patient 50-74% Stand Pivot Transfers: Moderate Assistance - Patient 50 - 74% Stand Pivot Transfer Details: Tactile cues for sequencing;Manual facilitation for weight shifting;Verbal cues for  safe use of DME/AE;Verbal cues for technique;Verbal cues for sequencing;Verbal cues for gait pattern;Tactile cues for weight shifting;Verbal cues for precautions/safety;Visual cues/gestures for sequencing;Tactile cues for weight beaing;Tactile cues for posture;Tactile cues for initiation Transfer (Assistive device): Rolling walker Locomotion  Gait Ambulation: Yes Gait Assistance: 2 Helpers Gait Distance (Feet): 150 Feet Assistive device: Rolling walker Gait Assistance Details: Tactile cues for sequencing;Tactile cues for weight shifting;Tactile cues for posture;Visual cues for safe use of DME/AE;Visual cues/gestures for precautions/safety;Visual cues/gestures for sequencing;Verbal cues for precautions/safety;Verbal cues for technique;Verbal cues for gait pattern;Verbal cues for safe use of DME/AE;Manual facilitation for weight shifting;Verbal cues for sequencing Gait Gait: Yes Gait Pattern: Impaired Gait Pattern: Narrow base of support;Decreased step length - left;Decreased step length - right;Decreased stride length;Lateral trunk lean to left;Trunk flexed;Poor foot clearance - left;Poor foot clearance - right Stairs / Additional Locomotion Stairs: Yes Stairs Assistance: Minimal Assistance - Patient > 75% Stair Management Technique: Two rails Number of Stairs: 4 Height of Stairs: 6   Discharge Criteria: Patient will be discharged from PT if patient refuses treatment 3 consecutive times without medical reason, if treatment goals not met, if there is a change in medical status, if patient makes no progress towards goals or if patient is discharged from hospital.  The above assessment, treatment plan, treatment alternatives and goals were discussed and mutually agreed upon: by patient and by family  WBreck Coons PT, DPT 04/06/2021, 12:49 PM

## 2021-04-06 NOTE — Progress Notes (Signed)
Occupational Therapy Session Note  Patient Details  Name: Gregory [name] MRN: [id number] Date of Birth: 08-07-1920  Today's Date: 04/07/2021 OT Individual Time: 1005-1102 OT Individual Time Calculation (min): 57 min    Short Term Goals: Week 1:  OT Short Term Goal 1 (Week 1): Pt will perform BSC/toilet transfer with LRAD and Min A OT Short Term Goal 2 (Week 1): Pt will complete UB dress with Mod A OT Short Term Goal 3 (Week 1): Pt will complete 1/3 components of donning pants using AE or adaptive techniques as needed  Skilled Therapeutic Interventions/Progress Updates:  Pt greeted in bed, noted bed was quite noisy, air seeming to be escaping bed. Checked with nurse who reported pts bed was broken, charge nurse already informed and working on replacing pts bed. When OT returned to check on pt, bed totally deflated. Pt able to transition OOB with Min A. Max A for stand pivot<recliner. Pt with significant posterior bias and UE tremor activity. Worked on anterior weight shifting and coordination while reaching for ADL items during shaving, oral care, and hair combing. Min-Mod A for fully shifting towards sink with increased time. Max A for shaving for safety, pt able to uncap shaving cream with cues. He needed Min A to comb hair, Mod A to meet fine motor demands of oral care. He remained sitting up in the recliner, wearing abdominal binder to protect PEG. Safety belt fastened, call bell within reach. Tx focus placed on activity tolerance, postural control, ADL retraining, and psychosocial health.   Pt wearing both hearing aides during session during [facility]  Therapy Documentation Precautions:  Precautions Precautions: Fall Precaution Comments: NPO with PEG tube, posterior lean Restrictions Weight Bearing Restrictions: No Pain: no c/o pain during tx   ADL: ADL Eating: NPO Grooming: Moderate assistance Upper Body Bathing: Moderate assistance Lower Body Bathing: Maximal assistance Upper Body  Dressing: Maximal assistance Lower Body Dressing: Dependent Toileting: Maximal assistance Toilet Transfer: Moderate assistance   Therapy/Group: Individual Therapy  Zadrian Mccauley A Jazzman Loughmiller 04/07/2021, 4:14 PM

## 2021-04-06 NOTE — Evaluation (Signed)
Speech Language Pathology Assessment and Plan  Patient Details  Name: Gregory [name] MRN: [id number] Date of Birth: Sep 25, 1920  SLP Diagnosis: Dysphagia;Cognitive Impairments;Speech and Language deficits  Rehab Potential: Good ELOS: 2-2.5 weeks    Today's Date: 04/06/2021 SLP Individual Time: 1340-1420 SLP Individual Time Calculation (min): 40 min   Hospital Problem: Principal Problem:   Debility  Past Medical History:  Past Medical History:  Diagnosis Date   Atrial flutter ([place])    s/p ablation   Cancer ([place])    skin cancer   Cataracts, bilateral    Hypertension    Neuromuscular disorder ([place])    Pacemaker 2010   Thyroid disease    Past Surgical History:  Past Surgical History:  Procedure Laterality Date   APPENDECTOMY     COLONOSCOPY     EYE SURGERY Bilateral    cataract surgery   HERNIA REPAIR Right    INGUINAL HERNIA REPAIR Left 10/12/2013   Procedure: LEFT  INGUINAL HERNIA REPAIR;  Surgeon: Odis Hollingshead, MD;  Location: [facility];  Service: General;  Laterality: Left;   INSERTION OF MESH Left 10/12/2013   Procedure: INSERTION OF MESH;  Surgeon: Odis Hollingshead, MD;  Location: [facility];  Service: General;  Laterality: Left;   PEG TUBE PLACEMENT Left     Assessment / Plan / Recommendation Clinical Impression Patient is a 88 year old male with history of A Flutter s/p ablation w/PPM, HTN, neuropathy, lung nodule, TVAR, HOH, dysphagia s/p G tube (placed in [place] 02/10/21) who was admitted on 03/02/21 with hypotension, dizziness, near syncope and hgb 5.9.  He was treated with fluid bolus and 2 units PRBC  with improvement of symptoms. FBOT positive with reports of dark stools as well as reports of Hgb of 7.0 and trauma to GI tract due to NGT per family.  Dr. Silverio Decamp recommended conservative management of NPO, IV PPI and transfusion as needed as heme positive stools felt to be related to NGT mucosal trauma and to hold eliquis. Palliative care consulted to discuss  [place] and patient/family elected on full code/full scope of practice. PEG tube noted to be loose and T-fastener removed  by radiology today. Hgb stabilized and tube feeds resumed. Patient noted to be deconditioned with posterior lean on standing, unsteady gait and difficulty completing ADLs. CIR recommended due to functional decline. Patient admitted  to CIR on 03/10/21 where he worked with therapies and was nearing his discharge date. He began having respiratory symptoms and tested positive for COVID 03/31/21. He was transferred to acute hospital for medical management. Pt. Was stabilized on room air and seen by therapy and recommended return to CIR. Patient re-admitted 2/22.23.  Upon arrival, patient was asleep in bed and difficult to rouse. SLP facilitated session by repositioning patient and providing constant verbal and tactile cues for arousal. Patient appeared disoriented but was irritable throughout session resulting in patient not responding to any question appropriately or directly. Patient perseverative on requiring mitts at night and throughout most of the day and was difficult to redirect. SLP provided oral care and administered ice chip trials. Patient without overt s/s of aspiration but utilized multiple swallows. Due to severe dysphagia, recommend patient remain NPO with trials of ice chips for comfort and pleasure. Patient with a low vocal intensity which impacted intelligibility. Of note, RMT devices have been lost since most recent transfer to another unit. Patient would benefit from skilled SLP intervention to maximize his swallowing and cognitive functioning as well as his speech intelligibility  prior to discharge.    Skilled Therapeutic Interventions          Administered a cognitive-linguistic evaluation and BSE, please see above for details.   SLP Assessment  Patient will need skilled [facility] during CIR admission    Recommendations  SLP Diet Recommendations:  NPO;Alternative means - long-term Medication Administration: Via alternative means Oral Care Recommendations: Oral care QID Patient destination: Home Follow up Recommendations: Home Health SLP;24 hour supervision/assistance Equipment Recommended: To be determined    SLP Frequency 3 to 5 out of 7 days   SLP Duration  SLP Intensity  SLP Treatment/Interventions 2-2.5 weeks  Minumum of 1-2 x/day, 30 to 90 minutes  Cueing hierarchy;Environmental controls;Patient/family education;Dysphagia/aspiration precaution training;Cognitive remediation/compensation;Speech/Language facilitation;Therapeutic Activities;Functional tasks;Therapeutic Exercise    Pain Pain Assessment Pain Scale: 0-10 Pain Score: 0-No pain  Prior Functioning Type of Home: House  Lives With: Son Available Help at Discharge: Family;Available 24 hours/day Vocation: Retired  Programmer, systems Overall Cognitive Status: Impaired/Different from baseline Arousal/Alertness: Awake/alert Orientation Level: Oriented to person Attention: Focused Focused Attention: Impaired Memory: Impaired Awareness: Impaired Awareness Impairment: Intellectual impairment Problem Solving: Impaired Problem Solving Impairment: Functional basic Safety/Judgment: Impaired  Comprehension Auditory Comprehension Overall Auditory Comprehension: Appears within functional limits for tasks assessed Expression Expression Primary Mode of Expression: Verbal Verbal Expression Overall Verbal Expression: Appears within functional limits for tasks assessed Written Expression Dominant Hand: Right Oral Motor Oral Motor/Sensory Function Overall Oral Motor/Sensory Function: Within functional limits Motor Speech Overall Motor Speech: Impaired Respiration: Within functional limits Phonation: Hoarse;Low vocal intensity Resonance: Within functional limits Articulation: Within functional limitis Intelligibility: Intelligibility reduced Word:  75-100% accurate Phrase: 75-100% accurate Sentence: 75-100% accurate Effective Techniques: Increased vocal intensity  Care Tool Care Tool Cognition Ability to hear (with hearing aid or hearing appliances if normally used Ability to hear (with hearing aid or hearing appliances if normally used): 2. Moderate difficulty - speaker has to increase volume and speak distinctly   Expression of Ideas and Wants Expression of Ideas and Wants: 2. Frequent difficulty - frequently exhibits difficulty with expressing needs and ideas   Understanding Verbal and Non-Verbal Content Understanding Verbal and Non-Verbal Content: 2. Sometimes understands - understands only basic conversations or simple, direct phrases. Frequently requires cues to understand  Memory/Recall Ability Memory/Recall Ability : None of the above were recalled    Bedside Swallowing Assessment General Date of Onset: 04/05/21 Previous Swallow Assessment: see HPI Diet Prior to this Study: NPO Temperature Spikes Noted: No Respiratory Status: Room air History of Recent Intubation: No Behavior/Cognition: Distractible;Uncooperative;Fusing/Irritable;Requires cueing Oral Cavity - Dentition: Adequate natural dentition Patient Positioning: Upright in bed Baseline Vocal Quality: Low vocal intensity Volitional Cough: Strong Volitional Swallow: Able to elicit  Ice Chips Ice chips: Impaired Presentation: Spoon Oral Phase Functional Implications: Prolonged oral transit Pharyngeal Phase Impairments: Suspected delayed Swallow;Multiple swallows Thin Liquid Thin Liquid: Not tested Nectar Thick Nectar Thick Liquid: Not tested Honey Thick Honey Thick Liquid: Not tested Puree Puree: Not tested Solid Solid: Not tested BSE Assessment Risk for Aspiration Impact on safety and function: Severe aspiration risk Other Related Risk Factors: History of dysphagia;Decreased respiratory status;Deconditioning  Short Term Goals: Week 1: SLP Short Term  Goal 1 (Week 1): Patient will utilize an increased vocal intensity at the phrase level with Mod A multimodal cues. SLP Short Term Goal 2 (Week 1): Patient and family will verbalize reasoning/procedures for ice chips with Mod verbal cues. SLP Short Term Goal 3 (Week 1): Patient will demonstrate orientation X 4 with  Mod verbal and visual cues. SLP Short Term Goal 4 (Week 1): Patient will answer questions approrpriately in 25% of opportunities with Mod A multimodal cues.  Refer to Care Plan for Long Term Goals  Recommendations for other services: Neuropsych  Discharge Criteria: Patient will be discharged from SLP if patient refuses treatment 3 consecutive times without medical reason, if treatment goals not met, if there is a change in medical status, if patient makes no progress towards goals or if patient is discharged from hospital.  The above assessment, treatment plan, treatment alternatives and goals were discussed and mutually agreed upon: by patient and by family  Mekala Winger, [place] 04/06/2021, 3:21 PM

## 2021-04-06 NOTE — Progress Notes (Signed)
PROGRESS NOTE   Subjective/Complaints:  Pt reports "I can't hear"- since doesn't have hearing aids in.  Doesn't recognize me.  Put in mittens last night- due to pulling at PEG and lines.  Lost abd binder in move to acute and back per nursing.   ROS: Limited due to hard of hearing and cognition   Objective:   No results found. Recent Labs    04/05/21 0319 04/06/21 0619  WBC 10.8* 8.1  HGB 9.0* 8.0*  HCT 28.3* 24.4*  PLT 149* 132*   Recent Labs    04/05/21 0319 04/06/21 0619  NA 139 137  K 3.9 3.3*  CL 102 102  CO2 25 28  GLUCOSE 132* 103*  BUN 35* 33*  CREATININE 1.08 1.19  CALCIUM 8.9 8.5*    Intake/Output Summary (Last 24 hours) at 04/06/2021 0847 Last data filed at 04/06/2021 0100 Gross per 24 hour  Intake --  Output 900 ml  Net -900 ml     Pressure Injury 03/03/21 Heel Left Deep Tissue Pressure Injury - Purple or maroon localized area of discolored intact skin or blood-filled blister due to damage of underlying soft tissue from pressure and/or shear. (Active)  03/03/21 1114  Location: Heel  Location Orientation: Left  Staging: Deep Tissue Pressure Injury - Purple or maroon localized area of discolored intact skin or blood-filled blister due to damage of underlying soft tissue from pressure and/or shear.  Wound Description (Comments):   Present on Admission: Yes     Pressure Injury 03/10/21 Toe (Comment  which one) Anterior;Left;Distal Unstageable - Full thickness tissue loss in which the base of the injury is covered by slough (yellow, tan, gray, green or brown) and/or eschar (tan, brown or black) in the wound bed. (Active)  03/10/21 1730  Location: Toe (Comment  which one) (Left great toe at the tip.)  Location Orientation: Anterior;Left;Distal  Staging: Unstageable - Full thickness tissue loss in which the base of the injury is covered by slough (yellow, tan, gray, green or brown) and/or eschar  (tan, brown or black) in the wound bed.  Wound Description (Comments): Black area at the tip of the left great toe.  Present on Admission: Yes     Pressure Injury 03/10/21 Toe (Comment  which one) Anterior;Right;Distal Unstageable - Full thickness tissue loss in which the base of the injury is covered by slough (yellow, tan, gray, green or brown) and/or eschar (tan, brown or black) in the wound bed (Active)  03/10/21 1730  Location: Toe (Comment  which one) (Right great toe at the tip of the toe.)  Location Orientation: Anterior;Right;Distal  Staging: Unstageable - Full thickness tissue loss in which the base of the injury is covered by slough (yellow, tan, gray, green or brown) and/or eschar (tan, brown or black) in the wound bed.  Wound Description (Comments): Right great toe at the tip of the toe.  Present on Admission: Yes     Pressure Injury 04/03/21 Heel Right Deep Tissue Pressure Injury - Purple or maroon localized area of discolored intact skin or blood-filled blister due to damage of underlying soft tissue from pressure and/or shear. (Active)  04/03/21 2000  Location: Heel  Location Orientation:  Right  Staging: Deep Tissue Pressure Injury - Purple or maroon localized area of discolored intact skin or blood-filled blister due to damage of underlying soft tissue from pressure and/or shear.  Wound Description (Comments):   Present on Admission:     Physical Exam: Vital Signs Blood pressure (!) 142/69, pulse 75, temperature 97.8 F (36.6 C), temperature source Oral, resp. rate 18, height 5\' 8"  (1.727 m), weight 71 kg, SpO2 96 %.   General: awake,but woke pt- very HOH- doesn't recognize me; in mittens; NAD HENT: conjugate gaze; oropharynx a little dry CV: regular rate; no JVD Pulmonary: a little coarse; no Wheezing- a few rhonchi; good air movement GI: soft, NT, ND, (+)BS (+) PEG- no abd binder Psychiatric: appropriate Neurological: awake, but not alert- repeating he can't hear  me even speaking loudly, and could in past Skin:    Comments: Bilateral great toes with dark eschar.   Neurological:     Mental Status: He is alert.     Comments: HOH. Soft voice. Alert and oriented to self only. Was able to recall place after cues. 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 Occasional tremors BUE.    Assessment/Plan: 1. Functional deficits which require 3+ hours per day of interdisciplinary therapy in a comprehensive inpatient rehab setting. Physiatrist is providing close team supervision and 24 hour management of active medical problems listed below. Physiatrist and rehab team continue to assess barriers to discharge/monitor patient progress toward functional and medical goals  Care Tool:  Bathing  Bathing activity did not occur: Refused Body parts bathed by patient: Front perineal area, Buttocks   Body parts bathed by helper: Buttocks     Bathing assist Assist Level: Total Assistance - Patient < 25%     Upper Body Dressing/Undressing Upper body dressing   What is the patient wearing?: Hospital gown only    Upper body assist Assist Level: Maximal Assistance - Patient 25 - 49%    Lower Body Dressing/Undressing Lower body dressing      What is the patient wearing?: Incontinence brief     Lower body assist Assist for lower body dressing: Total Assistance - Patient < 25%     Toileting Toileting    Toileting assist Assist for toileting: Total Assistance - Patient < 25%     Transfers Chair/bed transfer  Transfers assist     Chair/bed transfer assist level: Dependent - Patient 0%     Locomotion Ambulation   Ambulation assist   Ambulation activity did not occur: Safety/medical concerns  Assist level: Dependent - Patient 0%       Walk 10 feet activity   Assist  Walk 10 feet activity did not occur: Safety/medical concerns        Walk 50 feet activity   Assist Walk 50 feet with 2 turns  activity did not occur: Safety/medical concerns         Walk 150 feet activity   Assist Walk 150 feet activity did not occur: Safety/medical concerns         Walk 10 feet on uneven surface  activity   Assist Walk 10 feet on uneven surfaces activity did not occur: Safety/medical concerns         Wheelchair     Assist Is the patient using a wheelchair?: No             Wheelchair 50 feet with 2 turns activity    Assist  Wheelchair 150 feet activity     Assist          Blood pressure (!) 142/69, pulse 75, temperature 97.8 F (36.6 C), temperature source Oral, resp. rate 18, height 5\' 8"  (1.727 m), weight 71 kg, SpO2 96 %.  Medical Problem List and Plan: 1. Functional deficits secondary to debility s/p COVID with delirium             -patient may shower             -ELOS/Goals: Mod A in 2 weeks.              2/23- First day of evaluations- PT, OT and SLP 2.  Antithrombotics: -DVT/anticoagulation:  Pharmaceutical: Lovenox             -antiplatelet therapy: N/A 3. Pain Management: Tylenol prn.  4. Mood: LCSW to follow for evaluation and support.              -antipsychotic agents: N/A 5. Neuropsych: This patient is not capable of making decisions on his own behalf. 6. Skin/Wound Care: Routine pressure relief measures             --air mattress. Prevalon boots and foam dressing on ankles.  7. Fluids/Electrolytes/Nutrition:  Monitor renal status with routine checks             --pre-renal azotemia has improved with increase in water flushes 8. Covid 19: Treated with Remdesvir and steroids.  9. Delirium: Has been ongoing since Sunday. He has not slept for 2 days per nursing with bouts of hallucinations.              --also exacerbated by steroids and recent infection.              --will order sleep wake chart. Consistent routine.  --Monitor for hospital psychosis with prolonged LOS.  Add seroquel HS. 2/23- required mittens last  night since pulling at lines and PEG- will get abd binder and con't mittens as required/restraints required to prevent damage to pt by himself. Will verify has sleep wake chart done.   10. Insomnia: Will order sleep chart. Continue low dose Seroquel --Had trazodone and Seroquel ineffective last night  --will add melatonin as sedated this am.  11. Dysphagia: Continue NPO with ice chips after oral care.             --Nephro TID with water flushes qid.  12. GIB: H/H stable. Continue Iron supplement.             --on Carafate and protonix 13. CAF/CHB  s/p PPM: Monitor HR TID.  14. Constipation: Continue Senna S at bedtime.  15. Acute on chronic renal failure: Improved and likely at baseline.   2/23- Cr 1.19- and BUN 33- is dry but getting better- will recheck Monday 16. Tremors: d/c trazodone since ineffective anyway.   17. Hypokalemia  2/23- will replete K+ with 40 mEq x1. Already on Kphos   I spent a total of  39  minutes on total care today- >50% coordination of care- due to d/w nursing about getting abd binder, the mittens and reviewing chart and labs.       LOS: 1 days A FACE TO FACE EVALUATION WAS PERFORMED  Arnella Pralle 04/06/2021, 8:47 AM

## 2021-04-06 NOTE — Plan of Care (Signed)
°  Problem: RH Balance Goal: LTG: Patient will maintain dynamic sitting balance (OT) Description: LTG:  Patient will maintain dynamic sitting balance with assistance during activities of daily living (OT) Flowsheets (Taken 04/06/2021 1258) LTG: Pt will maintain dynamic sitting balance during ADLs with: Supervision/Verbal cueing Goal: LTG Patient will maintain dynamic standing with ADLs (OT) Description: LTG:  Patient will maintain dynamic standing balance with assist during activities of daily living (OT)  Flowsheets (Taken 04/06/2021 1258) LTG: Pt will maintain dynamic standing balance during ADLs with: Supervision/Verbal cueing   Problem: Sit to Stand Goal: LTG:  Patient will perform sit to stand in prep for activites of daily living with assistance level (OT) Description: LTG:  Patient will perform sit to stand in prep for activites of daily living with assistance level (OT) Flowsheets (Taken 04/06/2021 1258) LTG: PT will perform sit to stand in prep for activites of daily living with assistance level: Supervision/Verbal cueing   Problem: RH Grooming Goal: LTG Patient will perform grooming w/assist,cues/equip (OT) Description: LTG: Patient will perform grooming with assist, with/without cues using equipment (OT) Flowsheets (Taken 04/06/2021 1258) LTG: Pt will perform grooming with assistance level of: Supervision/Verbal cueing   Problem: RH Bathing Goal: LTG Patient will bathe all body parts with assist levels (OT) Description: LTG: Patient will bathe all body parts with assist levels (OT) Flowsheets (Taken 04/06/2021 1258) LTG: Pt will perform bathing with assistance level/cueing: Minimal Assistance - Patient > 75%   Problem: RH Dressing Goal: LTG Patient will perform upper body dressing (OT) Description: LTG Patient will perform upper body dressing with assist, with/without cues (OT). Flowsheets (Taken 04/06/2021 1258) LTG: Pt will perform upper body dressing with assistance level of:  Supervision/Verbal cueing Goal: LTG Patient will perform lower body dressing w/assist (OT) Description: LTG: Patient will perform lower body dressing with assist, with/without cues in positioning using equipment (OT) Flowsheets (Taken 04/06/2021 1258) LTG: Pt will perform lower body dressing with assistance level of: Minimal Assistance - Patient > 75%   Problem: RH Toileting Goal: LTG Patient will perform toileting task (3/3 steps) with assistance level (OT) Description: LTG: Patient will perform toileting task (3/3 steps) with assistance level (OT)  Flowsheets (Taken 04/06/2021 1258) LTG: Pt will perform toileting task (3/3 steps) with assistance level: Minimal Assistance - Patient > 75%   Problem: RH Toilet Transfers Goal: LTG Patient will perform toilet transfers w/assist (OT) Description: LTG: Patient will perform toilet transfers with assist, with/without cues using equipment (OT) Flowsheets (Taken 04/06/2021 1258) LTG: Pt will perform toilet transfers with assistance level of: Supervision/Verbal cueing   Problem: RH Tub/Shower Transfers Goal: LTG Patient will perform tub/shower transfers w/assist (OT) Description: LTG: Patient will perform tub/shower transfers with assist, with/without cues using equipment (OT) Flowsheets (Taken 04/06/2021 1258) LTG: Pt will perform tub/shower stall transfers with assistance level of: Supervision/Verbal cueing

## 2021-04-06 NOTE — Progress Notes (Signed)
Trial removal of mittens at this time. Abdominal binder in place to cover G tube. Patient not attempting to pull at tubes or lines. Patient reports that he feels "not right" but states he is unable to identify what specifically doesn't feel right. Denies pain. Pain recorded as 0/10 using PAINAD scale. Repositioned with pillows for comfort. Patient with productive cough with moderate amount of yellow, green, and black sputum. Patient reported to have a nose bleed a couple days ago on previous unit. Oxygen saturation stable at 92% on RA. All other VSS. Notified provider and PA of patient current condition. Head of bed set to 30 degrees for comfort. Prevalon boots in place as well as heel protectors. Family at bedside. Will continue to monitor.

## 2021-04-06 NOTE — Progress Notes (Signed)
Orthopedic Tech Progress Note Patient Details:  Gregory [name] [birth year] [id number]  Ortho Devices Type of Ortho Device: Abdominal binder Ortho Device/Splint Interventions: Ordered      [name] [name] 04/06/2021, 9:21 AM

## 2021-04-06 NOTE — Progress Notes (Signed)
Inpatient Rehabilitation  Patient information reviewed and entered into eRehab system by Alanni Vader M. Meghan Tiemann, M.A., CCC/SLP, PPS Coordinator.  Information including medical coding, functional ability and quality indicators will be reviewed and updated through discharge.    

## 2021-04-06 NOTE — Progress Notes (Signed)
Initial Nutrition Assessment  DOCUMENTATION CODES:   Not applicable  INTERVENTION:  Continue bolus feeds via PEG using Nepro formula at goal volume of 315 ml (10.5 ounces) given TID. (Total of 4 cartons/day)  Free water flushes of 300 ml every 6 hours per tube.   Tube feeding regimen to provide 1701 kcal, 77 grams of protein, and 1888 ml total free water.   NUTRITION DIAGNOSIS:   Inadequate oral intake related to inability to eat as evidenced by NPO status.  GOAL:   Patient will meet greater than or equal to 90% of their needs  MONITOR:   Weight trends, Labs, TF tolerance, Skin, I & O's  REASON FOR ASSESSMENT:   Consult Enteral/tube feeding initiation and management  ASSESSMENT:   88 year old male with history of a flutter s/p ablation W-PPM, HTN, neuropathy, HOH, dysphagia with weight loss status post G-tube placement at [facility] 02/10/2021 and who was recently admitted with acute GI bleed on 03/02/2021.Patient was noted to have functional decline and was admitted to CIR on 03/10/21. Pt found to be COVID positive on 2/16 and transferred to acute care for treatment. Pt with confusion and hallucinations due to delirium. Pt deconditioned and CIR was consulted to resume his rehab course. Pt readmitted to CIR.  Pt unavailable, busy working with therapy during attempted time of visit. Pt familiar to this RD from recent CIR admission. Pt has been tolerating his tube feeds well. RD to continue with current tube feeding orders. Unable to complete Nutrition-Focused physical exam at this time.   Labs and medications reviewed. Potassium low at 3.3. Potassium chloride given.   Diet Order:   Diet Order             Diet NPO time specified Except for: Ice Chips  Diet effective now                   EDUCATION NEEDS:   Not appropriate for education at this time  Skin:  Skin Assessment: Skin Integrity Issues: DTI: heels Unstageable: L foot toe, R foot toe  Last BM:   2/21  Height:   Ht Readings from Last 1 Encounters:  04/05/21 5\' 8"  (1.727 m)    Weight:   Wt Readings from Last 1 Encounters:  04/06/21 71 kg   BMI:  Body mass index is 23.8 kg/m.  Estimated Nutritional Needs:   Kcal:  1650-1850  Protein:  75-90 grams  Fluid:  >/= 1.6 L/day  Corrin Parker, MS, RD, LDN RD pager number/after hours weekend pager number on Amion.

## 2021-04-06 NOTE — Progress Notes (Signed)
Patient ID: Gregory [name], male   DOB: 03-12-1920, 88 y.o.   MRN: [id number]  SW met with pt and pt dtr Gregory [name] in room to welcome back to rehab. SW reviewed discharge process, and SW will follow-up after team conference on Tuesday.   [name], MSW, [place] Office: [phone] Cell: [phone] Fax: [phone]

## 2021-04-06 NOTE — Progress Notes (Signed)
Physical Therapy Session Note  Patient Details  Name: Daleon Willinger MRN: [id number] Date of Birth: 01-26-1921  Today's Date: 04/06/2021 PT Individual Time: 1502-1528 PT Individual Time Calculation (min): 26 min   Short Term Goals: Week 1:  PT Short Term Goal 1 (Week 1): Pt will perform supine<>sit with min assist PT Short Term Goal 2 (Week 1): Pt will perform sit<>stand transfers using LRAD with min assist PT Short Term Goal 3 (Week 1): Pt will perform stand pivot transfers using LRAD with min assist PT Short Term Goal 4 (Week 1): Pt will ambulate at least 153ft using LRAD with min assist  Skilled Therapeutic Interventions/Progress Updates: Pt presented in bed agreeable to therapy with encouragement. [name] [name] present and advised that pt has been less pleasant recently, most likely to having restraints (hand mitts) which has recently been d/c per nsg. Pt noted to have abdominal binder on and no indication of pulling or bothering PEG tube during session. Pt noted to be less pleasant than when this PTA previously worked with pt. Pt performed bed mobility with minA and use of bed features. Performed Sit to stand with modA from elevated bed with heavy posterior lean noted. Pt was able to ambulate ~48ft with CGA and RW before returning to EOB. Pt reluctantly agreeable to sit in recliner. PTA set up recliner and pt ambulated an additional ~25 then sat in recliner. In recliner pt participated in [place], hip flexion, hip abd/add, and manually resisted hamstring pulls. Pt left in recliner at end of session with call bell within reach and dgt present.     Therapy Documentation Precautions:  Precautions Precautions: Fall Precaution Comments: NPO with PEG tube, posterior lean Restrictions Weight Bearing Restrictions: No General:   Vital Signs: Therapy Vitals Temp: 98.4 F (36.9 C) Temp Source: Oral Pulse Rate: 70 Resp: 14 BP: (!) 132/55 Patient Position (if appropriate): Lying Oxygen  Therapy SpO2: 94 % O2 Device: Room Air Pain: Pain Assessment Pain Scale: 0-10 Pain Score: 0-No pain Mobility:   Locomotion :    Trunk/Postural Assessment : Cervical Assessment Cervical Assessment:  (forward head) Thoracic Assessment Thoracic Assessment:  (rounded shoulders) Lumbar Assessment Lumbar Assessment:  (posterior pelvic tilt) Postural Control Postural Control: Deficits on evaluation Righting Reactions: delayed and insufficient with posterior lean bias Protective Responses: delayed and insufficient with posterior lean bias  Balance: Balance Balance Assessed: Yes Static Sitting Balance Static Sitting - Balance Support: Feet supported;Bilateral upper extremity supported Static Sitting - Level of Assistance: 4: Min assist (CGA) Dynamic Sitting Balance Dynamic Sitting - Balance Support: During functional activity Dynamic Sitting - Level of Assistance: 4: Min Insurance risk surveyor Standing - Balance Support: Right upper extremity supported;Bilateral upper extremity supported Static Standing - Level of Assistance: 4: Min assist Dynamic Standing Balance Dynamic Standing - Balance Support: During functional activity;Bilateral upper extremity supported Dynamic Standing - Level of Assistance: 4: Min assist Exercises:   Other Treatments:      Therapy/Group: Individual Therapy  Ayani Ospina 04/06/2021, 3:48 PM

## 2021-04-06 NOTE — Evaluation (Signed)
Occupational Therapy Assessment and Plan  Patient Details  Name: Gregory [name] MRN: [id number] Date of Birth: Apr 16, 1920  OT Diagnosis: abnormal posture, altered mental status, cognitive deficits, and muscle weakness (generalized) Rehab Potential:   ELOS: 12-14 days   Today's Date: 04/06/2021 OT Individual Time:  259-563      87 min  Hospital Problem: Principal Problem:   Debility   Past Medical History:  Past Medical History:  Diagnosis Date   Atrial flutter ([place])    s/p ablation   Cancer ([place])    skin cancer   Cataracts, bilateral    Hypertension    Neuromuscular disorder ([place])    Pacemaker 2010   Thyroid disease    Past Surgical History:  Past Surgical History:  Procedure Laterality Date   APPENDECTOMY     COLONOSCOPY     EYE SURGERY Bilateral    cataract surgery   HERNIA REPAIR Right    INGUINAL HERNIA REPAIR Left 10/12/2013   Procedure: LEFT  INGUINAL HERNIA REPAIR;  Surgeon: Odis Hollingshead, MD;  Location: [facility];  Service: General;  Laterality: Left;   INSERTION OF MESH Left 10/12/2013   Procedure: INSERTION OF MESH;  Surgeon: Odis Hollingshead, MD;  Location: [facility];  Service: General;  Laterality: Left;   PEG TUBE PLACEMENT Left     Assessment & Plan Clinical Impression: Gregory [name] is a 88 year old male with history of a flutter s/p ablation W-PPM, HTN, neuropathy, HOH, dysphagia with weight loss status post G-tube placement at [facility] 02/10/2021 and who was recently admitted with acute GI bleed on 03/02/2021.  He was treated with IV fluids as well as 2 units PRBCs as well as PPI.  Eliquis was discontinued and conservative care recommended by Dr. Kathryne Sharper.  Patient was noted to have functional decline and was admitted to CIR on 03/10/21   GI and cardiology were consulted for input on Eliquis and recommended discontinuation due to risk of ongoing bleeding with heme positive stools and variable H&H.  He was kept n.p.o. as MBS showed severe to moderate  pharyngeal dysphagia with silent aspiration.  Hospital course was leukocytosis due to citrobacter amalnaticus UTI which was treated with cefepime however patient developed side effect of myoclonus therefore this was discontinued.  He was treated with Septra x4 days with some worsening of his renal status and was hydrated with IV fluids.     He did develop malaise, productive cough and fever on 02/16.  He was found to be COVID-positive and transferred to acute care for treatment.  He was treated with remdesivir and steroids however has had issues with confusion and hallucination for past 3 days due to delirium and/or steroid psychosis.  He did have episode of nose bleed that resolved with brief  packing. He has also had insomnia X 2 days which has exacerbated his confusion.    Respiratory status has been stable, he has been afebrile and has completed COVID 19 treatment course.  He continues to be deconditioned and CIR was consulted to resume his rehab course. Daughter at bedside reports ongoing hallucinations.   Patient transferred to CIR on 04/05/2021 .    Patient currently requires max with basic self-care skills secondary to muscle weakness, decreased cardiorespiratoy endurance, impaired timing and sequencing, unbalanced muscle activation, ataxia, decreased coordination, and decreased motor planning, decreased motor planning, decreased initiation, decreased attention, decreased awareness, decreased problem solving, decreased safety awareness, decreased memory, and delayed processing, and decreased sitting balance, decreased standing balance, decreased postural control,  and decreased balance strategies.  Prior to hospitalization, patient could complete BADL with independent .  Patient will benefit from skilled intervention to decrease level of assist with basic self-care skills and increase independence with basic self-care skills prior to discharge home with care partner.  Anticipate patient will require 24  hour supervision and follow up home health.  OT - End of Session Activity Tolerance: Tolerates 10 - 20 min activity with multiple rests Endurance Deficit: Yes Endurance Deficit Description: Pt needing multiple reclined rest breaks during dynamic tasks OT Assessment OT Patient demonstrates impairments in the following area(s): Balance;Behavior;Cognition;Endurance;Motor;Perception;Safety;Sensory;Skin Integrity OT Basic ADL's Functional Problem(s): Grooming;Bathing;Dressing;Toileting OT Transfers Functional Problem(s): Toilet;Tub/Shower OT Additional Impairment(s): None OT Plan OT Intensity: Minimum of 1-2 x/day, 45 to 90 minutes OT Frequency: 5 out of 7 days OT Duration/Estimated Length of Stay: 12-14 days OT Treatment/Interventions: Disease mangement/prevention;Balance/vestibular training;Self Care/advanced ADL retraining;Therapeutic Exercise;UE/LE Strength taining/ROM;Pain management;DME/adaptive equipment instruction;Cognitive remediation/compensation;Patient/family education;UE/LE Coordination activities;Community reintegration;Discharge planning;Functional mobility training;Psychosocial support;Therapeutic Activities;Visual/perceptual remediation/compensation OT Self Feeding Anticipated Outcome(s): no goal NPO OT Basic Self-Care Anticipated Outcome(s): Supervision - Min OT Toileting Anticipated Outcome(s): Min OT Bathroom Transfers Anticipated Outcome(s): Supervision OT Recommendation Patient destination: Home Follow Up Recommendations: Home health OT Equipment Recommended: To be determined   OT Evaluation Precautions/Restrictions  Precautions Precautions: Fall Precaution Comments: NPO with PEG tube, posterior lean Restrictions Weight Bearing Restrictions: No General   Vital Signs Therapy Vitals Temp: 98.4 F (36.9 C) Temp Source: Oral Pulse Rate: 70 Resp: 14 BP: (!) 132/55 Patient Position (if appropriate): Lying Oxygen Therapy SpO2: 94 % O2 Device: Room  Air Pain Pain Assessment Pain Scale: 0-10 Pain Score: 0-No pain Home Living/Prior Functioning Home Living Family/patient expects to be discharged to:: Private residence Living Arrangements: Children Available Help at Discharge: Family, Available 24 hours/day Type of Home: House Home Access: Stairs to enter Technical brewer of Steps: 2 Entrance Stairs-Rails: None Home Layout: Two level, Able to live on main level with bedroom/bathroom Alternate Level Stairs-Rails: Right Bathroom Shower/Tub: Multimedia programmer: Handicapped height Bathroom Accessibility: Yes Additional Comments: Son lives at home with Pt. and can be with him 24/7  Lives With: Son IADL History Occupation: Retired Type of Occupation: retired Sports administrator Prior Function Level of Independence: Independent with basic ADLs, Requires assistive device for independence, Independent with transfers  Able to Take Stairs?: Yes Driving: Yes Vocation: Retired Surveyor, mining Baseline Vision/History: 1 Wears glasses Ability to See in Adequate Light: 3 Highly impaired Perception  Perception: Within Functional Limits Praxis Praxis: Impaired Praxis Impairment Details: Initiation;Motor planning Cognition Overall Cognitive Status: Impaired/Different from baseline Arousal/Alertness: Awake/alert Orientation Level: Person Person: Oriented Year: 2022 Month: February Day of Week: Incorrect Memory: Impaired Memory Impairment: Storage deficit;Retrieval deficit;Decreased short term memory Immediate Memory Recall: Sock;Blue;Bed Memory Recall Sock: Not able to recall Memory Recall Blue: Not able to recall Memory Recall Bed: Not able to recall Attention: Focused Focused Attention: Impaired Awareness: Impaired Awareness Impairment: Intellectual impairment Problem Solving: Impaired Problem Solving Impairment: Functional basic Safety/Judgment: Impaired Sensation Sensation Light Touch: Impaired  Detail Peripheral sensation comments: impaired distally in feet bilaterally Coordination Gross Motor Movements are Fluid and Coordinated: No Fine Motor Movements are Fluid and Coordinated: No Coordination and Movement Description: Affected by balance impairments and baseline tremulous activity in hands bilaterally Finger Nose Finger Test: tremulous bilaterally Motor  Motor Motor: Abnormal postural alignment and control Motor - Skilled Clinical Observations: generalized weakness and deconditioning with impaired balance  Trunk/Postural Assessment  Cervical Assessment Cervical Assessment: Exceptions to [facility] Thoracic  Assessment Thoracic Assessment: Exceptions to [facility] Lumbar Assessment Lumbar Assessment: Exceptions to [facility] Postural Control Postural Control: Deficits on evaluation Righting Reactions: delayed and insufficient with posterior lean bias Protective Responses: delayed and insufficient with posterior lean bias  Balance Balance Balance Assessed: Yes Static Sitting Balance Static Sitting - Balance Support: Feet supported;Bilateral upper extremity supported Static Sitting - Level of Assistance: 4: Min assist Dynamic Sitting Balance Dynamic Sitting - Balance Support: During functional activity Dynamic Sitting - Level of Assistance: 4: Min assist Sitting balance - Comments: initially with posterior lean Static Standing Balance Static Standing - Balance Support: Right upper extremity supported;Bilateral upper extremity supported Static Standing - Level of Assistance: 4: Min assist Dynamic Standing Balance Dynamic Standing - Balance Support: During functional activity;Bilateral upper extremity supported Dynamic Standing - Level of Assistance: 4: Min assist;3: Mod assist Dynamic Standing - Balance Activities: Lateral lean/weight shifting;Forward lean/weight shifting Extremity/Trunk Assessment RUE Assessment RUE Assessment: Exceptions to [facility] Active Range of Motion (AROM) Comments:  Shoulder mobility ~90 degrees and tremulous throughout functional activity LUE Assessment LUE Assessment: Exceptions to [facility] Active Range of Motion (AROM) Comments: Shoulder mobility ~90 degrees and tremulous throughout functional activity  Care Tool Care Tool Self Care Eating     NA NPO    Oral Care     Mod A    Bathing      Max A        Upper Body Dressing(including orthotics)    Max A        Lower Body Dressing (excluding footwear)      Total A    Putting on/Taking off footwear      dependent       Care Tool Toileting Toileting activity    Max A     Care Tool Bed Mobility Roll left and right activity    Mod A    Sit to lying activity    Mod A    Lying to sitting on side of bed activity    Mod A     Care Tool Transfers Sit to stand transfer    Mod A    Chair/bed transfer    Mod A     Toilet transfer    Mod A     Care Tool Cognition  Expression of Ideas and Wants Expression of Ideas and Wants: 2. Frequent difficulty - frequently exhibits difficulty with expressing needs and ideas  Understanding Verbal and Non-Verbal Content Understanding Verbal and Non-Verbal Content: 2. Sometimes understands - understands only basic conversations or simple, direct phrases. Frequently requires cues to understand   Memory/Recall Ability Memory/Recall Ability : None of the above were recalled   Refer to Care Plan for Long Term Goals  SHORT TERM GOAL WEEK 1 OT Short Term Goal 1 (Week 1): Pt will perform BSC/toilet transfer with LRAD and Min A OT Short Term Goal 2 (Week 1): Pt will complete UB dress with Mod A OT Short Term Goal 3 (Week 1): Pt will complete 1/3 components of donning pants using AE or adaptive techniques as needed  Recommendations for other services: None    Skilled Therapeutic Intervention ADL ADL Eating: NPO Grooming: Moderate assistance Upper Body Bathing: Moderate assistance Lower Body Bathing: Maximal assistance Upper Body Dressing:  Maximal assistance Lower Body Dressing: Dependent Toileting: Maximal assistance Toilet Transfer: Moderate assistance Mobility  Bed Mobility Bed Mobility: Supine to Sit;Sit to Supine Supine to Sit: Moderate Assistance - Patient 50-74% Sit to Supine: Moderate Assistance - Patient 50-74% Transfers Sit to  Stand: Moderate Assistance - Patient 50-74% Stand to Sit: Moderate Assistance - Patient 50-74%   Skilled Interventions: Pt greeted at time of session semireclined bed level with nursing finishing up med pass, mittens on and nurse stating pt had been agitated. Pt noted to be more irritable during this session compared to previous CIR stay. Focus of session on bed mobility with MOD A, sit <> stands and stand pivot transfers MOD/MAX with posterior lean limiting with RW, sink level bathing and dressing with Mod/Max overall 2/2 reduced cognition and tremors. Total A for LB dressing at sit > stand level and to maneuver condom cath. Note bed and brief soaked with urine at beginning of session, extended time for cleaning and changing. Pt left at end of session bed level resting with mits on, alarm on call bell in reach. Note productive cough throughout activity and nursing made aware.    Discharge Criteria: Patient will be discharged from OT if patient refuses treatment 3 consecutive times without medical reason, if treatment goals not met, if there is a change in medical status, if patient makes no progress towards goals or if patient is discharged from hospital.  The above assessment, treatment plan, treatment alternatives and goals were discussed and mutually agreed upon: by patient and by family  Viona Gilmore 04/06/2021, 5:09 PM

## 2021-04-07 LAB — CBC WITH DIFFERENTIAL/PLATELET
Abs Immature Granulocytes: 0.04 10*3/uL (ref 0.00–0.07)
Basophils Absolute: 0 10*3/uL (ref 0.0–0.1)
Basophils Relative: 0 %
Eosinophils Absolute: 0.3 10*3/uL (ref 0.0–0.5)
Eosinophils Relative: 5 %
HCT: 26 % — ABNORMAL LOW (ref 39.0–52.0)
Hemoglobin: 8.3 g/dL — ABNORMAL LOW (ref 13.0–17.0)
Immature Granulocytes: 1 %
Lymphocytes Relative: 26 %
Lymphs Abs: 1.8 10*3/uL (ref 0.7–4.0)
MCH: 31.1 pg (ref 26.0–34.0)
MCHC: 31.9 g/dL (ref 30.0–36.0)
MCV: 97.4 fL (ref 80.0–100.0)
Monocytes Absolute: 0.7 10*3/uL (ref 0.1–1.0)
Monocytes Relative: 10 %
Neutro Abs: 4.2 10*3/uL (ref 1.7–7.7)
Neutrophils Relative %: 58 %
Platelets: 163 10*3/uL (ref 150–400)
RBC: 2.67 MIL/uL — ABNORMAL LOW (ref 4.22–5.81)
RDW: 17.5 % — ABNORMAL HIGH (ref 11.5–15.5)
WBC: 7 10*3/uL (ref 4.0–10.5)
nRBC: 0 % (ref 0.0–0.2)

## 2021-04-07 MED ORDER — ENOXAPARIN SODIUM 40 MG/0.4ML IJ SOSY
40.0000 mg | PREFILLED_SYRINGE | INTRAMUSCULAR | Status: DC
  Administered 2021-04-07 – 2021-04-13 (×7): 40 mg via SUBCUTANEOUS
  Filled 2021-04-07 (×7): qty 0.4

## 2021-04-07 NOTE — Progress Notes (Addendum)
At handoff, bed was reported to be having issues w/ maintaining air and the alarms sounding randomly. This Rn brought this issue to the attention of [facility] who notified portable, who will be coming to assess the bed. Family and OT updated at bedside regarding bed repair/replacement.   1630: This Rn called to bedside regarding the alarm sounding. At this time, I was made aware of the continued alarming and requested the bed be exchanged by portable. Bed exchanged, pt repositioned in new bed w/ 6-7 pillows, floating heels, elbows, shoulders, and mepilex and barrier cream applied to the sacrum/buttocks.Family requesting additional pillows however no extra available immediately.

## 2021-04-07 NOTE — Progress Notes (Signed)
Occupational Therapy Session Note  Patient Details  Name: Gregory [name] MRN: [id number] Date of Birth: December 07, 1920  Today's Date: 04/07/2021 OT Individual Time: 5427-0623 OT Individual Time Calculation (min): 51 min    Short Term Goals: Week 1:  OT Short Term Goal 1 (Week 1): Pt will perform BSC/toilet transfer with LRAD and Min A OT Short Term Goal 2 (Week 1): Pt will complete UB dress with Mod A OT Short Term Goal 3 (Week 1): Pt will complete 1/3 components of donning pants using AE or adaptive techniques as needed  Skilled Therapeutic Interventions/Progress Updates:  Pt greeted seated in recliner agreeable to OT intervention. Session focus on functional mobility,increasing overall activity tolerance, UB/LB strengthening/endurance to increase participation in ADLS  and decreasing overall caregiver burden.    Pt very flat during session but cooperative.           Pt completed x5 sit<>stands with rw with MIN A with emphasis on forward weight shift and hand placement. Pt able to stand  x5 and on the 5th stand pt able to complete standing squats during last stand to increase LB strength and endurance, pt completed squats with MINA. Pt completed below BUE/BLE therex with level 2 theraband: X20 shoulder flexion/extension via OH pulls X20 forward rows X20 LAQs X20 hip ABD/ADD  Pt completed below LB/UB therex with no resistance: 2 mins of LAQs with 30 sec rest break 2 mins of seated marches with 30 sec rest break X20 modified crunches from recliner with emphasis on pulling trunk forward with BUEs on arm rests of chair and slowly lowering back into recliner with use of core strength. Pt completed stand pivot back to EOB with MIN A for sit>stand and MOD A to pivot d/t shakiness in LB/UB and impaired balance, MOD A for sit>supine.  pt left supine in bed with all needs within reach.    Pt on RA during session with SpO2 >90%                       Therapy Documentation Precautions:   Precautions Precautions: Fall Precaution Comments: NPO with PEG tube, posterior lean Restrictions Weight Bearing Restrictions: No  Pain:no pain reported during session     Therapy/Group: Individual Therapy  Precious Haws 04/07/2021, 3:56 PM

## 2021-04-07 NOTE — Progress Notes (Signed)
PROGRESS NOTE   Subjective/Complaints:  Pt out of mittens Pt said doesn't know if I can help because he just woke up.   Appears to be answering questions, nursing reports doing better overnight.   ROS: Limited due to cognition and HOH   Objective:   No results found. Recent Labs    04/06/21 0619 04/07/21 0640  WBC 8.1 7.0  HGB 8.0* 8.3*  HCT 24.4* 26.0*  PLT 132* 163   Recent Labs    04/05/21 0319 04/06/21 0619  NA 139 137  K 3.9 3.3*  CL 102 102  CO2 25 28  GLUCOSE 132* 103*  BUN 35* 33*  CREATININE 1.08 1.19  CALCIUM 8.9 8.5*    Intake/Output Summary (Last 24 hours) at 04/07/2021 0973 Last data filed at 04/06/2021 1900 Gross per 24 hour  Intake --  Output 250 ml  Net -250 ml     Pressure Injury 03/03/21 Heel Left Deep Tissue Pressure Injury - Purple or maroon localized area of discolored intact skin or blood-filled blister due to damage of underlying soft tissue from pressure and/or shear. (Active)  03/03/21 1114  Location: Heel  Location Orientation: Left  Staging: Deep Tissue Pressure Injury - Purple or maroon localized area of discolored intact skin or blood-filled blister due to damage of underlying soft tissue from pressure and/or shear.  Wound Description (Comments):   Present on Admission: Yes     Pressure Injury 03/10/21 Toe (Comment  which one) Anterior;Left;Distal Unstageable - Full thickness tissue loss in which the base of the injury is covered by slough (yellow, tan, gray, green or brown) and/or eschar (tan, brown or black) in the wound bed. (Active)  03/10/21 1730  Location: Toe (Comment  which one) (Left great toe at the tip.)  Location Orientation: Anterior;Left;Distal  Staging: Unstageable - Full thickness tissue loss in which the base of the injury is covered by slough (yellow, tan, gray, green or brown) and/or eschar (tan, brown or black) in the wound bed.  Wound Description  (Comments): Black area at the tip of the left great toe.  Present on Admission: Yes     Pressure Injury 03/10/21 Toe (Comment  which one) Anterior;Right;Distal Unstageable - Full thickness tissue loss in which the base of the injury is covered by slough (yellow, tan, gray, green or brown) and/or eschar (tan, brown or black) in the wound bed (Active)  03/10/21 1730  Location: Toe (Comment  which one) (Right great toe at the tip of the toe.)  Location Orientation: Anterior;Right;Distal  Staging: Unstageable - Full thickness tissue loss in which the base of the injury is covered by slough (yellow, tan, gray, green or brown) and/or eschar (tan, brown or black) in the wound bed.  Wound Description (Comments): Right great toe at the tip of the toe.  Present on Admission: Yes     Pressure Injury 04/03/21 Heel Right Deep Tissue Pressure Injury - Purple or maroon localized area of discolored intact skin or blood-filled blister due to damage of underlying soft tissue from pressure and/or shear. (Active)  04/03/21 2000  Location: Heel  Location Orientation: Right  Staging: Deep Tissue Pressure Injury - Purple or maroon localized area  of discolored intact skin or blood-filled blister due to damage of underlying soft tissue from pressure and/or shear.  Wound Description (Comments):   Present on Admission:     Physical Exam: Vital Signs Blood pressure (!) 132/55, pulse 70, temperature 98.4 F (36.9 C), temperature source Oral, resp. rate 14, height 5\' 8"  (1.727 m), weight 81 kg, SpO2 94 %.     General: awake, alert, after woken up- still sleepy; not clean shaven;  NAD HENT: conjugate gaze; oropharynx moist CV: regular rate; no JVD Pulmonary: CTA B/L; no W/R/R- good air movement GI: soft, NT, ND, (+)BS Psychiatric: appropriate- less confused; but just woke up Neurological: HOH, but recognizes me as the doctor today - appears closer to baseline when was here last time- still Ox2? Knows location-  not time.  Skin:    Comments: Bilateral great toes with dark eschar.    Assessment/Plan: 1. Functional deficits which require 3+ hours per day of interdisciplinary therapy in a comprehensive inpatient rehab setting. Physiatrist is providing close team supervision and 24 hour management of active medical problems listed below. Physiatrist and rehab team continue to assess barriers to discharge/monitor patient progress toward functional and medical goals  Care Tool:  Bathing  Bathing activity did not occur: Refused Body parts bathed by patient: Right arm, Left arm   Body parts bathed by helper: Front perineal area     Bathing assist Assist Level: Maximal Assistance - Patient 24 - 49%     Upper Body Dressing/Undressing Upper body dressing   What is the patient wearing?: Pull over shirt    Upper body assist Assist Level: Maximal Assistance - Patient 25 - 49%    Lower Body Dressing/Undressing Lower body dressing      What is the patient wearing?: Incontinence brief, Pants     Lower body assist Assist for lower body dressing: Total Assistance - Patient < 25%     Toileting Toileting    Toileting assist Assist for toileting: Total Assistance - Patient < 25%     Transfers Chair/bed transfer  Transfers assist     Chair/bed transfer assist level: Moderate Assistance - Patient 50 - 74%     Locomotion Ambulation   Ambulation assist   Ambulation activity did not occur: Safety/medical concerns  Assist level: 2 helpers Assistive device: Walker-rolling Max distance: 150'   Walk 10 feet activity   Assist  Walk 10 feet activity did not occur: Safety/medical concerns  Assist level: 2 helpers Assistive device: Walker-rolling   Walk 50 feet activity   Assist Walk 50 feet with 2 turns activity did not occur: Safety/medical concerns  Assist level: 2 helpers Assistive device: Walker-rolling    Walk 150 feet activity   Assist Walk 150 feet activity did not  occur: Safety/medical concerns  Assist level: 2 helpers Assistive device: Walker-rolling    Walk 10 feet on uneven surface  activity   Assist Walk 10 feet on uneven surfaces activity did not occur: Safety/medical concerns   Assist level: 2 helpers Assistive device: Aeronautical engineer Is the patient using a wheelchair?: No Type of Wheelchair: Manual           Wheelchair 50 feet with 2 turns activity    Assist            Wheelchair 150 feet activity     Assist          Blood pressure (!) 132/55, pulse 70, temperature 98.4 F (36.9 C), temperature  source Oral, resp. rate 14, height 5\' 8"  (1.727 m), weight 81 kg, SpO2 94 %.  Medical Problem List and Plan: 1. Functional deficits secondary to debility s/p COVID with delirium             -patient may shower             -ELOS/Goals: Mod A in 2 weeks.             2/24- Con't CIR_ PT, OT and SLP 2.  Antithrombotics: -DVT/anticoagulation:  Pharmaceutical: Lovenox             -antiplatelet therapy: N/A 3. Pain Management: Tylenol prn.  4. Mood: LCSW to follow for evaluation and support.              -antipsychotic agents: N/A 5. Neuropsych: This patient is not capable of making decisions on his own behalf. 6. Skin/Wound Care: Routine pressure relief measures             --air mattress. Prevalon boots and foam dressing on ankles.  7. Fluids/Electrolytes/Nutrition:  Monitor renal status with routine checks             --pre-renal azotemia has improved with increase in water flushes  2/24- BUN down slightly to 33- con't regimen with water flushes and TF's.  8. Covid 19: Treated with Remdesvir and steroids.  9. Delirium: Has been ongoing since Sunday. He has not slept for 2 days per nursing with bouts of hallucinations.              --also exacerbated by steroids and recent infection.              --will order sleep wake chart. Consistent routine.  --Monitor for hospital psychosis with  prolonged LOS.  Add seroquel HS. 2/23- required mittens last night since pulling at lines and PEG- will get abd binder and con't mittens as required/restraints required to prevent damage to pt by himself. Will verify has sleep wake chart done.    2/24- per nursing ,slept better- out of mittens- will monitor closely and use restraints if required to protect pt/PEG. 10. Insomnia: Will order sleep chart. Continue low dose Seroquel --Had trazodone and Seroquel ineffective last night  --will add melatonin as sedated this am.  11. Dysphagia: Continue NPO with ice chips after oral care.             --Nephro TID with water flushes qid.  12. GIB: H/H stable. Continue Iron supplement.             --on Carafate and protonix 13. CAF/CHB  s/p PPM: Monitor HR TID.  14. Constipation: Continue Senna S at bedtime.  15. Acute on chronic renal failure: Improved and likely at baseline.   2/23- Cr 1.19- and BUN 33- is dry but getting better- will recheck Monday 16. Tremors: d/c trazodone since ineffective anyway.   17. Hypokalemia  2/23- will replete K+ with 40 mEq x1. Already on Kphos   I spent a total of  35   minutes on total care today- >50% coordination of care- due to d/w nursing about overnight as well as reviewing chart in depth     LOS: 2 days A FACE TO FACE EVALUATION WAS PERFORMED  Ceaser Ebeling 04/07/2021, 8:12 AM

## 2021-04-07 NOTE — Progress Notes (Signed)
Physical Therapy Session Note ° °Patient Details  °Name: Gregory [name] °MRN: [id number] °Date of Birth: 08/19/1920 ° °Today's Date: 04/07/2021 °PT Individual Time: 1303-1420 °PT Individual Time Calculation (min): 77 min  ° °Short Term Goals: °Week 1:  PT Short Term Goal 1 (Week 1): Pt will perform supine<>sit with min assist °PT Short Term Goal 2 (Week 1): Pt will perform sit<>stand transfers using LRAD with min assist °PT Short Term Goal 3 (Week 1): Pt will perform stand pivot transfers using LRAD with min assist °PT Short Term Goal 4 (Week 1): Pt will ambulate at least 100ft using LRAD with min assist ° °Skilled Therapeutic Interventions/Progress Updates: Pt presented in recliner agreeable to therapy. PTA encouraged to take pt outside for therapeutic change of scenery and fresh air. Pt initially reluctant but eventually agreed. Pt performed Sit to stand with minA and max multimodal cues for increasing anterior trunk flexion (nose over toes). Pt then performed ambulatory transfer to w/c with RW and CGA. Pt transported to [facility] entrance for time management and energy conservation. Pt spent approx 10 min outside with pt ultimately appreciative for some fresh air. Pt then transported back to rehab gym and participated in ambulation 70ft x 2 with RW and CGA. Pt did require facilitation of anterior lean when performing first stand but second stand after seated break demonstrated improvement. Pt indicated increased fatigue after ambulation. Pt then participated in seated therex including LAQ, hip flexion, hip abd/add all with 2lb cuff and 2x 10. Pt also performed hip er and hamstring pulls 2 x 10 with red theraband. Pt transported back to room and performed ambulatory transfer to recliner with CGA for Sit to stand and transfer. In recliner pt's dgt Jackie requested to change top from paper scrub to pajama top. Pt required minA for doffing top and donning new top. Pt left in recliner at end of session with call bell within  reach, needs met and dgt Jackie present.  °   ° °Therapy Documentation °Precautions:  °Precautions °Precautions: Fall °Precaution Comments: NPO with PEG tube, posterior lean °Restrictions °Weight Bearing Restrictions: No °General: °  °Vital Signs: °Therapy Vitals °Temp: 98.2 °F (36.8 °C) °Temp Source: Oral °Pulse Rate: 68 °Resp: 14 °BP: (!) 144/76 °Patient Position (if appropriate): Lying °Oxygen Therapy °SpO2: 95 % °O2 Device: Room Air °Pain: °  °Mobility: °  °Locomotion : °   °Trunk/Postural Assessment : °   °Balance: °  °Exercises: °  °Other Treatments:   ° ° ° °Therapy/Group: Individual Therapy ° °  °04/07/2021, 4:32 PM  °

## 2021-04-07 NOTE — Progress Notes (Signed)
RN at bedside to assess Pt. Is alert and awake. Daughter at bedside as well. RN addressed needs and concerns. Pt denies any needs at this time. He denies any pain. Pt states he didn't want to wear the boots. Pt education given about protecting his heels and skin from further breakdown.   2300: Charge Nurse Roselyn Reef and myself to the Pt bedside to explain the benefits of his boots and floating his heels. Pt then agreed to have them placed. Boots are currently in place and Pt is resting without any distress.

## 2021-04-08 DIAGNOSIS — R410 Disorientation, unspecified: Secondary | ICD-10-CM

## 2021-04-08 DIAGNOSIS — N1831 Chronic kidney disease, stage 3a: Secondary | ICD-10-CM

## 2021-04-08 DIAGNOSIS — E876 Hypokalemia: Secondary | ICD-10-CM

## 2021-04-08 LAB — GLUCOSE, CAPILLARY
Glucose-Capillary: 81 mg/dL (ref 70–99)
Glucose-Capillary: 133 mg/dL — ABNORMAL HIGH (ref 70–99)
Glucose-Capillary: 96 mg/dL (ref 70–99)

## 2021-04-08 NOTE — Progress Notes (Signed)
Occupational Therapy Session Note  Patient Details  Name: Gregory [name] MRN: [id number] Date of Birth: October 17, 1920  Today's Date: 04/08/2021 OT Individual Time: 1008-1106 OT Individual Time Calculation (min): 58 min    Short Term Goals: Week 1:  OT Short Term Goal 1 (Week 1): Pt will perform BSC/toilet transfer with LRAD and Min A OT Short Term Goal 2 (Week 1): Pt will complete UB dress with Mod A OT Short Term Goal 3 (Week 1): Pt will complete 1/3 components of donning pants using AE or adaptive techniques as needed  Skilled Therapeutic Interventions/Progress Updates:    Pt in bed to start session with his son present to start.  Pt declined need to complete bathing tasks but was agreeable to complete supine to sit.  He was able to donn scrub pants EOB with max assist sit to stand.  He was unable to reach either foot for threading them ,but could assist some with pulling them up over hips once standing.  He needed total assist for donning gripper socks with max assist for standing to use urinal as pt voiced the need to use it prior to leaving the room.  He was then taken down to the therapy gym where he completed two intervals of standing with the RW for support in while engaged in Horticulturist, commercial Program and [employer] using the BITS.  He was able to complete 2 mins of standing for Visual Scanning Program with accuracy at 66% alternating UEs with average reaction time at 2.6 seconds.  During [place] task, he was able to complete in 4 mins with 7 errors.  Mod assist for sit to stand transitions from the wheelchair with increased posterior bias noted with initial sit to stand.  He was returned to the room at the end of the session where he remained in the wheelchair with the call button and phone in reach and safety alarm belt in place.  His son was present as well.    Therapy Documentation Precautions:  Precautions Precautions: Fall Precaution Comments: NPO with PEG tube,  posterior lean Restrictions Weight Bearing Restrictions: No  Pain: Pain Assessment Pain Scale: Faces Pain Score: 0-No pain ADL: See Care Tool Section for some details of mobility and selfcare   Therapy/Group: Individual Therapy  Alonda Weaber OTR/L 04/08/2021, 12:44 PM

## 2021-04-08 NOTE — Progress Notes (Signed)
Physical Therapy Session Note  Patient Details  Name: Gregory [name] MRN: [id number] Date of Birth: 25-Apr-1920  Today's Date: 04/08/2021 PT Individual Time: 1430-1530 PT Individual Time Calculation (min): 60 min   Short Term Goals: Week 1:  PT Short Term Goal 1 (Week 1): Pt will perform supine<>sit with min assist PT Short Term Goal 2 (Week 1): Pt will perform sit<>stand transfers using LRAD with min assist PT Short Term Goal 3 (Week 1): Pt will perform stand pivot transfers using LRAD with min assist PT Short Term Goal 4 (Week 1): Pt will ambulate at least 163ft using LRAD with min assist  Skilled Therapeutic Interventions/Progress Updates:  Pt was seen bedside in the pm. Pt transported to gym. Pt performed multiple sit to stand transfers with min to mod A and verbal cues with increased time. Pt performed step taps 3 x 10 reps each for LE strengthening. Pt ambulated 88 feet x 3 with rolling walker and c/g with verbal cues. Pt returned to room and left sitting up in w/c with daughter at bedside.   Therapy Documentation Precautions:  Precautions Precautions: Fall Precaution Comments: NPO with PEG tube, posterior lean Restrictions Weight Bearing Restrictions: No General:  Pain: Pain Assessment Pain Score: 0-No pain   Therapy/Group: Individual Therapy  Dub Amis 04/08/2021, 4:08 PM

## 2021-04-08 NOTE — IPOC Note (Addendum)
Individualized overall Plan of Care [facility]) Patient Details Name: Dimetrius Montfort MRN: [id number] DOB: Jun 01, 1920  Admitting Diagnosis: [facility] [facility] Problems: Principal Problem:   Debility Active Problems:   Hypokalemia   Stage 3a chronic kidney disease ([place])   Delirium     Functional Problem List: Nursing Bladder, Bowel, Endurance, Nutrition, Safety, Skin Integrity  PT Balance, Endurance, Motor, Pain, Perception, Safety, Skin Integrity, Behavior  OT Balance, Behavior, Cognition, Endurance, Motor, Perception, Safety, Sensory, Skin Integrity  SLP Cognition, Nutrition  TR         Basic ADLs: OT Grooming, Bathing, Dressing, Toileting     Advanced  ADLs: OT       Transfers: PT Bed Mobility, Bed to Chair, Car, Manufacturing systems engineer, Metallurgist: PT Ambulation, Stairs     Additional Impairments: OT None  SLP Communication, Swallowing expression Attention, Awareness, Problem Solving, Memory, Social Interaction  TR      Anticipated Outcomes Item Anticipated Outcome  Self Feeding no goal NPO  Swallowing  Min A   Basic self-care  Supervision - Engineer, manufacturing Transfers Supervision  Bowel/Bladder  mod assist  Transfers  CGA using LRAD  Locomotion  CGA using LRAD  Communication  Min A  Cognition  Min A  Pain  n/a  Safety/Judgment  mod assist   Therapy Plan: PT Intensity: Minimum of 1-2 x/day ,45 to 90 minutes PT Frequency: 5 out of 7 days PT Duration Estimated Length of Stay: ~2-2.5 weeks OT Intensity: Minimum of 1-2 x/day, 45 to 90 minutes OT Frequency: 5 out of 7 days OT Duration/Estimated Length of Stay: 12-14 days SLP Intensity: Minumum of 1-2 x/day, 30 to 90 minutes SLP Frequency: 3 to 5 out of 7 days SLP Duration/Estimated Length of Stay: 2-2.5 weeks    Team Interventions: Nursing Interventions Patient/Family Education, Bladder Management, Bowel Management, Disease Management/Prevention, Skin Care/Wound  Management, Discharge Planning  PT interventions Ambulation/gait training, Community reintegration, DME/adaptive equipment instruction, Neuromuscular re-education, Psychosocial support, Stair training, UE/LE Strength taining/ROM, Training and development officer, Discharge planning, Functional electrical stimulation, Pain management, Skin care/wound management, UE/LE Coordination activities, Cognitive remediation/compensation, Therapeutic Activities, Disease management/prevention, Functional mobility training, Patient/family education, Splinting/orthotics, Therapeutic Exercise, Visual/perceptual remediation/compensation  OT Interventions Disease mangement/prevention, Training and development officer, Self Care/advanced ADL retraining, Therapeutic Exercise, UE/LE Strength taining/ROM, Pain management, DME/adaptive equipment instruction, Cognitive remediation/compensation, Patient/family education, UE/LE Coordination activities, Community reintegration, Discharge planning, Functional mobility training, Psychosocial support, Therapeutic Activities, Visual/perceptual remediation/compensation  SLP Interventions Cueing hierarchy, Environmental controls, Patient/family education, Dysphagia/aspiration precaution training, Cognitive remediation/compensation, Speech/Language facilitation, Therapeutic Activities, Functional tasks, Therapeutic Exercise  TR Interventions    SW/CM Interventions Discharge Planning, Psychosocial Support, Patient/Family Education   Barriers to Discharge MD  Medical stability and Behavior  Nursing Decreased caregiver support, Home environment access/layout, Incontinence, Wound Care, Nutrition means 2 level, 2 steps, no rails. able to live on main level with bedroom/bathroom. chair lift available to 2nd level. Son can provide mod assist.  PT Inaccessible home environment, Home environment access/layout, Nutrition means    OT      SLP      SW       Team Discharge Planning: Destination:  PT-Home ,OT- Home , SLP-Home Projected Follow-up: PT-Home health PT, 24 hour supervision/assistance, OT-  Home health OT, SLP-Home Health SLP, 24 hour supervision/assistance Projected Equipment Needs: PT-To be determined, OT- To be determined, SLP-To be determined Equipment Details: PT-has hospital bed, RW, and narrow based quad cane, OT-  Patient/family involved in discharge  planning: PT- Patient,  OT-Patient, SLP-Patient, Family member/caregiver  MD ELOS: 14-16 days. Medical Rehab Prognosis:  Good Assessment: 88 year old male with history of a flutter s/p ablation W-PPM, HTN, neuropathy, HOH, dysphagia with weight loss status post G-tube placement at [facility] 02/10/2021 and who was recently admitted with acute GI bleed on 03/02/2021.  He was treated with IV fluids as well as 2 units PRBCs as well as PPI.  Eliquis was discontinued and conservative care recommended by Dr. Kathryne Sharper.  Patient was noted to have functional decline and was admitted to CIR on 03/10/21   GI and cardiology were consulted for input on Eliquis and recommended discontinuation due to risk of ongoing bleeding with heme positive stools and variable H&H.  He was kept n.p.o. as MBS showed severe to moderate pharyngeal dysphagia with silent aspiration.  Hospital course was leukocytosis due to citrobacter amalnaticus UTI which was treated with cefepime however patient developed side effect of myoclonus therefore this was discontinued.  He was treated with Septra x4 days with some worsening of his renal status and was hydrated with IV fluids.     He did develop malaise, productive cough and fever on 02/16.  He was found to be COVID-positive and transferred to acute care for treatment.  He was treated with remdesivir and steroids however has had issues with confusion and hallucination for past 3 days due to delirium and/or steroid psychosis.  He did have episode of nose bleed that resolved with brief  packing. He has also had insomnia X 2 days  which has exacerbated his confusion.    Respiratory status has been stable, he has been afebrile and has completed COVID 19 treatment course.  He continues to be deconditioned with cognitive delays and CIR was consulted to resume his rehab course. Daughter at bedside reports ongoing hallucinations.  Will set goals for CGA with PT, min a/mod I with OT and SLP.  Due to the current state of emergency, patients may not be receiving their 3-hours of Medicare-mandated therapy.  See Team Conference Notes for weekly updates to the plan of care

## 2021-04-08 NOTE — Progress Notes (Signed)
PROGRESS NOTE   Subjective/Complaints: Patient seen laying in bed this morning.  He indicates he slept well overnight.  No reported issues overnight.  ROS: Limited due to cognition and HOH   Objective:   No results found. Recent Labs    04/06/21 0619 04/07/21 0640  WBC 8.1 7.0  HGB 8.0* 8.3*  HCT 24.4* 26.0*  PLT 132* 163    Recent Labs    04/06/21 0619  NA 137  K 3.3*  CL 102  CO2 28  GLUCOSE 103*  BUN 33*  CREATININE 1.19  CALCIUM 8.5*     Intake/Output Summary (Last 24 hours) at 04/08/2021 1245 Last data filed at 04/08/2021 0418 Gross per 24 hour  Intake 315 ml  Output 600 ml  Net -285 ml      Pressure Injury 03/03/21 Heel Left Deep Tissue Pressure Injury - Purple or maroon localized area of discolored intact skin or blood-filled blister due to damage of underlying soft tissue from pressure and/or shear. (Active)  03/03/21 1114  Location: Heel  Location Orientation: Left  Staging: Deep Tissue Pressure Injury - Purple or maroon localized area of discolored intact skin or blood-filled blister due to damage of underlying soft tissue from pressure and/or shear.  Wound Description (Comments):   Present on Admission: Yes     Pressure Injury 03/10/21 Toe (Comment  which one) Anterior;Left;Distal Unstageable - Full thickness tissue loss in which the base of the injury is covered by slough (yellow, tan, gray, green or brown) and/or eschar (tan, brown or black) in the wound bed. (Active)  03/10/21 1730  Location: Toe (Comment  which one) (Left great toe at the tip.)  Location Orientation: Anterior;Left;Distal  Staging: Unstageable - Full thickness tissue loss in which the base of the injury is covered by slough (yellow, tan, gray, green or brown) and/or eschar (tan, brown or black) in the wound bed.  Wound Description (Comments): Black area at the tip of the left great toe.  Present on Admission: Yes      Pressure Injury 03/10/21 Toe (Comment  which one) Anterior;Right;Distal Unstageable - Full thickness tissue loss in which the base of the injury is covered by slough (yellow, tan, gray, green or brown) and/or eschar (tan, brown or black) in the wound bed (Active)  03/10/21 1730  Location: Toe (Comment  which one) (Right great toe at the tip of the toe.)  Location Orientation: Anterior;Right;Distal  Staging: Unstageable - Full thickness tissue loss in which the base of the injury is covered by slough (yellow, tan, gray, green or brown) and/or eschar (tan, brown or black) in the wound bed.  Wound Description (Comments): Right great toe at the tip of the toe.  Present on Admission: Yes     Pressure Injury 04/03/21 Heel Right Deep Tissue Pressure Injury - Purple or maroon localized area of discolored intact skin or blood-filled blister due to damage of underlying soft tissue from pressure and/or shear. (Active)  04/03/21 2000  Location: Heel  Location Orientation: Right  Staging: Deep Tissue Pressure Injury - Purple or maroon localized area of discolored intact skin or blood-filled blister due to damage of underlying soft tissue from pressure and/or shear.  Wound Description (Comments):   Present on Admission:     Physical Exam: Vital Signs Blood pressure (!) 153/76, pulse 72, temperature 98.2 F (36.8 C), temperature source Oral, resp. rate 15, height 5\' 8"  (1.727 m), weight 81 kg, SpO2 98 %. General: NAD. HENT: conjugate gaze; oropharynx moist CV: regular rate; no JVD Pulmonary: CTA B/L; no W/R/R- good air movement GI: soft, NT, ND, (+)BS Psychiatric: appropriate- less confused Neurological: Alert HOH Skin: Comments: Bilateral great toes with dark eschar.    Assessment/Plan: 1. Functional deficits which require 3+ hours per day of interdisciplinary therapy in a comprehensive inpatient rehab setting. Physiatrist is providing close team supervision and 24 hour management of active  medical problems listed below. Physiatrist and rehab team continue to assess barriers to discharge/monitor patient progress toward functional and medical goals  Care Tool:  Bathing  Bathing activity did not occur: Refused Body parts bathed by patient: Right arm, Left arm   Body parts bathed by helper: Front perineal area     Bathing assist Assist Level: Maximal Assistance - Patient 24 - 49%     Upper Body Dressing/Undressing Upper body dressing   What is the patient wearing?: Pull over shirt    Upper body assist Assist Level: Maximal Assistance - Patient 25 - 49%    Lower Body Dressing/Undressing Lower body dressing      What is the patient wearing?: Pants     Lower body assist Assist for lower body dressing: Maximal Assistance - Patient 25 - 49%     Toileting Toileting    Toileting assist Assist for toileting: Total Assistance - Patient < 25%     Transfers Chair/bed transfer  Transfers assist     Chair/bed transfer assist level: Maximal Assistance - Patient 25 - 49% (progressing to mod assist)     Locomotion Ambulation   Ambulation assist   Ambulation activity did not occur: Safety/medical concerns  Assist level: Contact Guard/Touching assist Assistive device: Walker-rolling Max distance: 13ft   Walk 10 feet activity   Assist  Walk 10 feet activity did not occur: Safety/medical concerns  Assist level: Contact Guard/Touching assist Assistive device: Walker-rolling   Walk 50 feet activity   Assist Walk 50 feet with 2 turns activity did not occur: Safety/medical concerns  Assist level: Contact Guard/Touching assist Assistive device: Walker-rolling    Walk 150 feet activity   Assist Walk 150 feet activity did not occur: Safety/medical concerns  Assist level: 2 helpers Assistive device: Walker-rolling    Walk 10 feet on uneven surface  activity   Assist Walk 10 feet on uneven surfaces activity did not occur: Safety/medical  concerns   Assist level: 2 helpers Assistive device: Aeronautical engineer Is the patient using a wheelchair?: No Type of Wheelchair: Manual           Wheelchair 50 feet with 2 turns activity    Assist            Wheelchair 150 feet activity     Assist          Blood pressure (!) 153/76, pulse 72, temperature 98.2 F (36.8 C), temperature source Oral, resp. rate 15, height 5\' 8"  (1.727 m), weight 81 kg, SpO2 98 %.  Medical Problem List and Plan: 1. Functional deficits secondary to debility s/p COVID with delirium  Continue CIR 2.  Antithrombotics: -DVT/anticoagulation:  Pharmaceutical: Lovenox             -antiplatelet therapy: N/A 3.  Pain Management: Tylenol prn.  4. Mood: LCSW to follow for evaluation and support.              -antipsychotic agents: N/A 5. Neuropsych: This patient is not capable of making decisions on his own behalf. 6. Skin/Wound Care: Routine pressure relief measures             --air mattress. Prevalon boots and foam dressing on ankles.  7. Fluids/Electrolytes/Nutrition:  Monitor renal status with routine checks             --pre-renal azotemia has improved with increase in water flushes  2/24- BUN down slightly to 33- con't regimen with water flushes and TF's.  8. Covid 19: Treated with Remdesvir and steroids.  9. Delirium:              --also exacerbated by steroids and recent infection.              --will order sleep wake chart. Consistent routine.  --Monitor for hospital psychosis with prolonged LOS.  Add seroquel HS. 2/23- required mittens last night since pulling at lines and PEG- will get abd binder and con't mittens as required/restraints required to prevent damage to pt by himself. Will verify has sleep wake chart done.   2/24- per nursing ,slept better- out of mittens- will monitor closely and use restraints if required to protect pt/PEG. ?  Improving on 2/25 10.  Sleep disturbance: Will order sleep  chart.  --Had trazodone and Seroquel ineffective  --Added melatonin as sedated this am.  11. Dysphagia: Continue n.p.o. with ice chips after oral care.             --Nephro TID with water flushes qid.  12. GIB: H/H stable. Continue Iron supplement.             --on Carafate and protonix  Hemoglobin 8.3 on 2/24, continue to monitor 13. CAF/CHB  s/p PPM: Monitor HR TID.  14. Constipation: Continue Senna S at bedtime.  15. Acute on chronic renal failure: Improved and likely at baseline.   Creatinine 1.19 on 2/23, continue to monitor 16. Tremors: d/c trazodone since ineffective anyway.   17. Hypokalemia  Potassium 3.3 on 2/23, additional supplement ordered, labs ordered for Monday  LOS: 3 days A FACE TO FACE EVALUATION WAS PERFORMED  Cipriano Millikan [name] 04/08/2021, 12:45 PM

## 2021-04-08 NOTE — Progress Notes (Signed)
Speech Language Pathology Daily Session Note  Patient Details  Name: Gregory [name] MRN: [id number] Date of Birth: 1920-09-29  Today's Date: 04/08/2021 SLP Individual Time: 9201-0071 SLP Individual Time Calculation (min): 55 min  Short Term Goals: Week 1: SLP Short Term Goal 1 (Week 1): Patient will utilize an increased vocal intensity at the phrase level with Mod A multimodal cues. SLP Short Term Goal 2 (Week 1): Patient and family will verbalize reasoning/procedures for ice chips with Mod verbal cues. SLP Short Term Goal 3 (Week 1): Patient will demonstrate orientation X 4 with Mod verbal and visual cues. SLP Short Term Goal 4 (Week 1): Patient will answer questions approrpriately in 25% of opportunities with Mod A multimodal cues.  Skilled Therapeutic Interventions: Skilled ST services focused on education, swallow and cognitive skills. Pt's son and then daughter were present for treatment session. SLP attempted to address orientation and overall cognitive skills, however pt became very frustrated and agitated with ST. Pt supports "my thinking skills have not changed. I need to work on my movement." SLP and pt collaborated on need to address thinking skills to assist in carryover of strategies provided by OT/PT. Pt initially appeared to have acute deficits in cognition, but as the session continues and pt engaged in conversation with SLP, SLP suspects his cognition is nearing his baseline. Pt agreed to address cognition skills only to support OT/PT services. Family was present and provided no input on acute changes in cognition. SLP will communicate with primary OT/PT piror to adjusting SLP treatment plan. Pt was agreeable to target swallow skills. Pt demonstrated oral care with set up assist and consumed x8 medium size ice chips while completing CTAR with SLP to strengthen laryngeal muscles. Pt demonstrated no overt s/s aspiration. SLP provided education to pt and pt's family on ice chip protocol  (oral care, minimal amount 5-8 ice chips a day for comfort/pleasure only) family verbally demonstrated teach back. Pt was not able to recall protocol at this time. CIR currently does not have available EMST devices, SLP will check other sources piror to next session. Pt was left with family. Recommend to continue ST services.      Pain Pain Assessment Pain Score: 0-No pain  Therapy/Group: Individual Therapy  Chrislyn Seedorf  [facility] 04/08/2021, 4:06 PM

## 2021-04-08 NOTE — Progress Notes (Signed)
Physical Therapy Session Note  Patient Details  Name: Gregory [name] MRN: [id number] Date of Birth: [birth year]  Today's Date: 04/08/2021 PT Individual Time: 1132-1200 PT Individual Time Calculation (min): 28 min   Short Term Goals: Week 1:  PT Short Term Goal 1 (Week 1): Pt will perform supine<>sit with min assist PT Short Term Goal 2 (Week 1): Pt will perform sit<>stand transfers using LRAD with min assist PT Short Term Goal 3 (Week 1): Pt will perform stand pivot transfers using LRAD with min assist PT Short Term Goal 4 (Week 1): Pt will ambulate at least 15ft using LRAD with min assist   Skilled Therapeutic Interventions/Progress Updates:  Patient seated upright in w/c on entrance to room. Patient alert and agreeable to PT session.   Patient with no pain complaint throughout session.  Therapeutic Activity: Transfers: Patient performed sit<>stand transfers throughout session with MinA and improving to CGA with cues for foot placement, hand placement and forward lean.   Pt guided in minisquats for activation of quad muscle group especially on LLE. Able to perform with increasing ROM 2x10 reps. Good functional strength noted. Standing tolerance up to 2min prior to pt request to sit. Pt able to stand with no UE support and CGA. Can perform standing heel raises with CGA and UE support.   Pt's son relates that he would like to be able to "see" pt's progress quantitatively. Education provided that therapy tendency is to use more qualitative focus of assessment. However, suggested use of progress "board" for therapy to fill out daily in room in order to promote improved family understanding of therapy activities, and see pt's progress.  Patient seated  in w/c at end of session with brakes locked, no alarm set as son in room, and all needs within reach. NT and RN notified as to absence of pt alarm.     Therapy Documentation Precautions:  Precautions Precautions: Fall Precaution Comments:  NPO with PEG tube, posterior lean Restrictions Weight Bearing Restrictions: No General:   Vital Signs:  Pain: Pain Assessment Pain Score: 0-No pain  Therapy/Group: Individual Therapy  Alger Simons PT, DPT 04/08/2021, 5:30 PM

## 2021-04-09 LAB — GLUCOSE, CAPILLARY
Glucose-Capillary: 93 mg/dL (ref 70–99)
Glucose-Capillary: 86 mg/dL (ref 70–99)
Glucose-Capillary: 141 mg/dL — ABNORMAL HIGH (ref 70–99)

## 2021-04-09 NOTE — Progress Notes (Addendum)
Physical Therapy Session Note  Patient Details  Name: Gregory [name] MRN: [id number] Date of Birth: 11-02-1920  Today's Date: 04/09/2021 PT Individual Time: 1303-1408 PT Individual Time Calculation (min): 65 min   Short Term Goals: Week 1:  PT Short Term Goal 1 (Week 1): Pt will perform supine<>sit with min assist PT Short Term Goal 2 (Week 1): Pt will perform sit<>stand transfers using LRAD with min assist PT Short Term Goal 3 (Week 1): Pt will perform stand pivot transfers using LRAD with min assist PT Short Term Goal 4 (Week 1): Pt will ambulate at least 127ft using LRAD with min assist  Skilled Therapeutic Interventions/Progress Updates: Pt presented in bed with son Gregory [name] present agreeable to therapy. Pt denies pain during session. Performed supine to sit with minA and use of bed feautres. PTA donned abdominal binder to secure PEG tube. PTA also donned shoes total A for time management. Pt then performed Sit to stand with modA and facilitation for increased anterior weight shifting. Pt then ambulated to w/c with CGA and RW. PTA moved pt to sink and pt was able to comb hair then son assisted with grooming task. Pt then transported to day room and participated in ambulation 76ft with minA required for Sit to stand and CGA for ambulation. Pt requires cues for improving erect posture and noted to have narrow BOS and decreased B foot clearance. Pt then participated in ambulation ~63ft with RW including stepping over shuffleboard sticks x 4. Pt demonstrates good safety with RW management when stepping over obstacles. Pt then performed toe taps to 4in step for hip flexion strengthening and weight shifting. Pt performed L/R x 10 then alternating x 10. Pt requires consistent minA for Sit to stand throughout session. Pt then transferred to NuStep and participated in NuStep L2 x 5 min maintaining 40-50 SPM for general conditioning. Pt transported back to room at end of session and performed ambulatory transfer  to recliner with RW in same manner as prior. Pt left in recliner at end of session with belt alarm on, call bell within reach and son present.      Therapy Documentation Precautions:  Precautions Precautions: Fall Precaution Comments: NPO with PEG tube, posterior lean Restrictions Weight Bearing Restrictions: No General:   Vital Signs: Therapy Vitals Temp: 98.1 F (36.7 C) Temp Source: Oral Pulse Rate: 69 Resp: 15 BP: (!) 141/52 Patient Position (if appropriate): Sitting Oxygen Therapy SpO2: 100 % O2 Device: Room Air Pain:   Mobility:   Locomotion :    Trunk/Postural Assessment :    Balance:   Exercises:   Other Treatments:      Therapy/Group: Individual Therapy  Gerarda Conklin 04/09/2021, 4:02 PM

## 2021-04-10 LAB — CBC
HCT: 26.2 % — ABNORMAL LOW (ref 39.0–52.0)
Hemoglobin: 8.6 g/dL — ABNORMAL LOW (ref 13.0–17.0)
MCH: 31.3 pg (ref 26.0–34.0)
MCHC: 32.8 g/dL (ref 30.0–36.0)
MCV: 95.3 fL (ref 80.0–100.0)
Platelets: 161 10*3/uL (ref 150–400)
RBC: 2.75 MIL/uL — ABNORMAL LOW (ref 4.22–5.81)
RDW: 16.8 % — ABNORMAL HIGH (ref 11.5–15.5)
WBC: 7.3 10*3/uL (ref 4.0–10.5)
nRBC: 0 % (ref 0.0–0.2)

## 2021-04-10 LAB — BASIC METABOLIC PANEL
Anion gap: 7 (ref 5–15)
BUN: 23 mg/dL (ref 8–23)
CO2: 27 mmol/L (ref 22–32)
Calcium: 9 mg/dL (ref 8.9–10.3)
Chloride: 104 mmol/L (ref 98–111)
Creatinine, Ser: 1.12 mg/dL (ref 0.61–1.24)
GFR, Estimated: 59 mL/min — ABNORMAL LOW (ref >60)
Glucose, Bld: 99 mg/dL (ref 70–99)
Potassium: 3.6 mmol/L (ref 3.5–5.1)
Sodium: 138 mmol/L (ref 135–145)

## 2021-04-10 LAB — GLUCOSE, CAPILLARY
Glucose-Capillary: 100 mg/dL — ABNORMAL HIGH (ref 70–99)
Glucose-Capillary: 103 mg/dL — ABNORMAL HIGH (ref 70–99)
Glucose-Capillary: 104 mg/dL — ABNORMAL HIGH (ref 70–99)
Glucose-Capillary: 84 mg/dL (ref 70–99)
Glucose-Capillary: 92 mg/dL (ref 70–99)

## 2021-04-10 NOTE — Progress Notes (Signed)
PROGRESS NOTE   Subjective/Complaints:  Pt reports he's awake and needs to pee immediately.   Remembers examiner.    ROS: limited due to cognition and HOH Objective:   No results found. Recent Labs    04/10/21 0737  WBC 7.3  HGB 8.6*  HCT 26.2*  PLT 161   Recent Labs    04/10/21 0737  NA 138  K 3.6  CL 104  CO2 27  GLUCOSE 99  BUN 23  CREATININE 1.12  CALCIUM 9.0    Intake/Output Summary (Last 24 hours) at 04/10/2021 0825 Last data filed at 04/10/2021 0224 Gross per 24 hour  Intake 4815 ml  Output 1175 ml  Net 3640 ml     Pressure Injury 03/03/21 Heel Left Deep Tissue Pressure Injury - Purple or maroon localized area of discolored intact skin or blood-filled blister due to damage of underlying soft tissue from pressure and/or shear. (Active)  03/03/21 1114  Location: Heel  Location Orientation: Left  Staging: Deep Tissue Pressure Injury - Purple or maroon localized area of discolored intact skin or blood-filled blister due to damage of underlying soft tissue from pressure and/or shear.  Wound Description (Comments):   Present on Admission: Yes     Pressure Injury 03/10/21 Toe (Comment  which one) Anterior;Left;Distal Unstageable - Full thickness tissue loss in which the base of the injury is covered by slough (yellow, tan, gray, green or brown) and/or eschar (tan, brown or black) in the wound bed. (Active)  03/10/21 1730  Location: Toe (Comment  which one) (Left great toe at the tip.)  Location Orientation: Anterior;Left;Distal  Staging: Unstageable - Full thickness tissue loss in which the base of the injury is covered by slough (yellow, tan, gray, green or brown) and/or eschar (tan, brown or black) in the wound bed.  Wound Description (Comments): Black area at the tip of the left great toe.  Present on Admission: Yes     Pressure Injury 03/10/21 Toe (Comment  which one) Anterior;Right;Distal  Unstageable - Full thickness tissue loss in which the base of the injury is covered by slough (yellow, tan, gray, green or brown) and/or eschar (tan, brown or black) in the wound bed (Active)  03/10/21 1730  Location: Toe (Comment  which one) (Right great toe at the tip of the toe.)  Location Orientation: Anterior;Right;Distal  Staging: Unstageable - Full thickness tissue loss in which the base of the injury is covered by slough (yellow, tan, gray, green or brown) and/or eschar (tan, brown or black) in the wound bed.  Wound Description (Comments): Right great toe at the tip of the toe.  Present on Admission: Yes     Pressure Injury 04/03/21 Heel Right Deep Tissue Pressure Injury - Purple or maroon localized area of discolored intact skin or blood-filled blister due to damage of underlying soft tissue from pressure and/or shear. (Active)  04/03/21 2000  Location: Heel  Location Orientation: Right  Staging: Deep Tissue Pressure Injury - Purple or maroon localized area of discolored intact skin or blood-filled blister due to damage of underlying soft tissue from pressure and/or shear.  Wound Description (Comments):   Present on Admission:     Physical  Exam: Vital Signs Blood pressure (!) 146/57, pulse 73, temperature 98.1 F (36.7 C), temperature source Oral, resp. rate 14, height 5\' 8"  (1.727 m), weight 80.9 kg, SpO2 91 %.    General: awake, alert, appropriate, just woke up- asking to have A to void; supine in bed; NAD HENT: conjugate gaze; oropharynx moist CV: regular rate; no JVD Pulmonary: CTA B/L; no W/R/R- good air movement GI: soft, NT, ND, (+)BS- hypoactive Psychiatric: appropriate Neurological: alert- more appropriate, remembers me- very HOH- doesn't have hearing aids in  [facility] Skin: Comments: Bilateral great toes with dark eschar.    Assessment/Plan: 1. Functional deficits which require 3+ hours per day of interdisciplinary therapy in a comprehensive inpatient rehab  setting. Physiatrist is providing close team supervision and 24 hour management of active medical problems listed below. Physiatrist and rehab team continue to assess barriers to discharge/monitor patient progress toward functional and medical goals  Care Tool:  Bathing  Bathing activity did not occur: Safety/medical concerns Body parts bathed by patient: Right arm, Left arm   Body parts bathed by helper: Front perineal area     Bathing assist Assist Level: Maximal Assistance - Patient 24 - 49%     Upper Body Dressing/Undressing Upper body dressing   What is the patient wearing?: Pull over shirt    Upper body assist Assist Level: Maximal Assistance - Patient 25 - 49%    Lower Body Dressing/Undressing Lower body dressing      What is the patient wearing?: Pants     Lower body assist Assist for lower body dressing: Maximal Assistance - Patient 25 - 49%     Toileting Toileting    Toileting assist Assist for toileting: Total Assistance - Patient < 25%     Transfers Chair/bed transfer  Transfers assist     Chair/bed transfer assist level: Moderate Assistance - Patient 50 - 74%     Locomotion Ambulation   Ambulation assist   Ambulation activity did not occur: Safety/medical concerns  Assist level: Contact Guard/Touching assist Assistive device: Walker-rolling Max distance: 88   Walk 10 feet activity   Assist  Walk 10 feet activity did not occur: Safety/medical concerns  Assist level: Contact Guard/Touching assist Assistive device: Walker-rolling   Walk 50 feet activity   Assist Walk 50 feet with 2 turns activity did not occur: Safety/medical concerns  Assist level: Contact Guard/Touching assist Assistive device: Walker-rolling    Walk 150 feet activity   Assist Walk 150 feet activity did not occur: Safety/medical concerns  Assist level: 2 helpers Assistive device: Walker-rolling    Walk 10 feet on uneven surface  activity   Assist  Walk 10 feet on uneven surfaces activity did not occur: Safety/medical concerns   Assist level: 2 helpers Assistive device: Aeronautical engineer Is the patient using a wheelchair?: No Type of Wheelchair: Manual           Wheelchair 50 feet with 2 turns activity    Assist            Wheelchair 150 feet activity     Assist          Blood pressure (!) 146/57, pulse 73, temperature 98.1 F (36.7 C), temperature source Oral, resp. rate 14, height 5\' 8"  (1.727 m), weight 80.9 kg, SpO2 91 %.  Medical Problem List and Plan: 1. Functional deficits secondary to debility s/p COVID with delirium  Con't CIR- PT, OT and SLP- more alert today 2.  Antithrombotics: -  DVT/anticoagulation:  Pharmaceutical: Lovenox             -antiplatelet therapy: N/A 3. Pain Management: Tylenol prn.  4. Mood: LCSW to follow for evaluation and support.              -antipsychotic agents: N/A 5. Neuropsych: This patient is not capable of making decisions on his own behalf. 6. Skin/Wound Care: Routine pressure relief measures             --air mattress. Prevalon boots and foam dressing on ankles.  7. Fluids/Electrolytes/Nutrition:  Monitor renal status with routine checks             --pre-renal azotemia has improved with increase in water flushes  2/24- BUN down slightly to 33- con't regimen with water flushes and TF's.   2/27- BUN down to 23 and Cr 1.12- so back to baseline- doing better with current water flushes- con't regimen 8. Covid 19: Treated with Remdesvir and steroids.  9. Delirium:              --also exacerbated by steroids and recent infection.              --will order sleep wake chart. Consistent routine.  --Monitor for hospital psychosis with prolonged LOS.  Add seroquel HS. 2/23- required mittens last night since pulling at lines and PEG- will get abd binder and con't mittens as required/restraints required to prevent damage to pt by himself. Will  verify has sleep wake chart done.   2/24- per nursing ,slept better- out of mittens- will monitor closely and use restraints if required to protect pt/PEG. ?  Improving on 2/25 2/27- not requiring mittens anymore- doing better- con't regimen 10.  Sleep disturbance: Will order sleep chart.  --Had trazodone and Seroquel ineffective  --Added melatonin as sedated this am.  11. Dysphagia: Continue n.p.o. with ice chips after oral care.             --Nephro TID with water flushes qid.  12. GIB: H/H stable. Continue Iron supplement.             --on Carafate and protonix  Hemoglobin 8.3 on 2/24, continue to monitor 13. CAF/CHB  s/p PPM: Monitor HR TID.  14. Constipation: Continue Senna S at bedtime.  15. Acute on chronic renal failure: Improved and likely at baseline.   Creatinine 1.19 on 2/23, continue to monitor  2/27- Cr 1.12 and BUN down to 23- con't regimen 16. Tremors: d/c trazodone since ineffective anyway.   17. Hypokalemia  Potassium 3.3 on 2/23, additional supplement ordered, labs ordered for Monday  2/27- K+ 3.6- doin gbetter    LOS: 5 days A FACE TO FACE EVALUATION WAS PERFORMED  Valary Manahan 04/10/2021, 8:25 AM

## 2021-04-10 NOTE — Care Management (Signed)
[facility] Individual Statement of Services  Patient Name:  Gregory [name]  Date:  04/10/2021  Welcome to the [facility].  Our goal is to provide you with an individualized program based on your diagnosis and situation, designed to meet your specific needs.  With this comprehensive rehabilitation program, you will be expected to participate in at least 3 hours of rehabilitation therapies Monday-Friday, with modified therapy programming on the weekends.  Your rehabilitation program will include the following services:  Physical Therapy (PT), Occupational Therapy (OT), Speech Therapy (ST), 24 hour per day rehabilitation nursing, Therapeutic Recreaction (TR), Psychology, Neuropsychology, Care Coordinator, Rehabilitation Medicine, [place], and Other  Weekly team conferences will be held on Tuesdays to discuss your progress.  Your Inpatient Rehabilitation Care Coordinator will talk with you frequently to get your input and to update you on team discussions.  Team conferences with you and your family in attendance may also be held.  Expected length of stay: 12-14 days    Overall anticipated outcome: Supervision  Depending on your progress and recovery, your program may change. Your Inpatient Rehabilitation Care Coordinator will coordinate services and will keep you informed of any changes. Your Inpatient Rehabilitation Care Coordinator's name and contact numbers are listed  below.  The following services may also be recommended but are not provided by the [place] will be made to provide these services after discharge if needed.  Arrangements include referral to agencies that provide these services.  Your insurance has been verified to be:  Medicare A/B  Your primary  doctor is:  Wylene Simmer  Pertinent information will be shared with your doctor and your insurance company.  Inpatient Rehabilitation Care Coordinator:  Cathleen Corti [phone] or ([phone]  Information discussed with and copy given to patient by: Rana Snare, 04/10/2021, 10:21 AM

## 2021-04-10 NOTE — Progress Notes (Signed)
Occupational Therapy Session Note  Patient Details  Name: Gregory [name] MRN: [id number] Date of Birth: 1921/02/06  Today's Date: 04/10/2021 OT Individual Time: 5809-9833 OT Individual Time Calculation (min): 60 min    Short Term Goals: Week 1:  OT Short Term Goal 1 (Week 1): Pt will perform BSC/toilet transfer with LRAD and Min A OT Short Term Goal 2 (Week 1): Pt will complete UB dress with Mod A OT Short Term Goal 3 (Week 1): Pt will complete 1/3 components of donning pants using AE or adaptive techniques as needed  Skilled Therapeutic Interventions/Progress Updates:    Pt resting in bed upon arrival with LPN present administering PEG tube feeding and meds. Pt agreeable to getting OOB. Supine>sit EOB with min A using bed rails and HOB elevated. Sit>stand and tranfser to w/c with min A. Pt completed grooming tasks at sink. Pt agreeable to taking shower tomorrow. Pt transitioned to gym. BLE exercises with 1.5# ankle weights;marching 3x20 and kicking ball 3x10. Pt also completed BUE therex with 3# bar, chest presses and biceps curls 3x10. Pt returned to room and remained in w/c. All needs within reach. Belt alarm activated.   Therapy Documentation Precautions:  Precautions Precautions: Fall Precaution Comments: NPO with PEG tube, posterior lean Restrictions Weight Bearing Restrictions: No  Pain:  Pt denies pain this morning   Therapy/Group: Individual Therapy  Leroy Libman 04/10/2021, 10:20 AM

## 2021-04-10 NOTE — Progress Notes (Signed)
Physical Therapy Session Note  Patient Details  Name: Gregory [name] MRN: [id number] Date of Birth: 1920/06/06  Today's Date: 04/10/2021 PT Individual Time: 2025-4270 PT Individual Time Calculation (min): 55 min   Short Term Goals: Week 1:  PT Short Term Goal 1 (Week 1): Pt will perform supine<>sit with min assist PT Short Term Goal 2 (Week 1): Pt will perform sit<>stand transfers using LRAD with min assist PT Short Term Goal 3 (Week 1): Pt will perform stand pivot transfers using LRAD with min assist PT Short Term Goal 4 (Week 1): Pt will ambulate at least 198ft using LRAD with min assist  Skilled Therapeutic Interventions/Progress Updates:    Pt received seated in w/c in room, agreeable to PT session. No complaints of pain. Dependent transport via w/c to/from therapy gym for time and energy conservation. Sit to stand with CGA to RW during session. Ambulation x 50 ft, 3 x 150 with RW and CGA for balance, cues to keep head and trunk upright during gait. Ascend/descend 8 x 6" stairs with 2 handrails and min A for balance, step-to gait pattern. Pt exhibits flexed trunk and knees when navigating stairs but no LE buckling. Pt agreeable to remain seated in recliner in room at end of session, needs in reach. Pt pleasant and interactive during session, even smiling at times. Mood appears improved from previous week.  Therapy Documentation Precautions:  Precautions Precautions: Fall Precaution Comments: NPO with PEG tube, posterior lean Restrictions Weight Bearing Restrictions: No      Therapy/Group: Individual Therapy   Excell Seltzer, PT, DPT, CSRS  04/10/2021, 4:00 PM

## 2021-04-10 NOTE — Progress Notes (Signed)
Physical Therapy Session Note  Patient Details  Name: Gregory [name] MRN: [id number] Date of Birth: 09-08-1920  Today's Date: 04/10/2021 PT Individual Time: 7253-6644 PT Individual Time Calculation (min): 42 min   Short Term Goals: Week 1:  PT Short Term Goal 1 (Week 1): Pt will perform supine<>sit with min assist PT Short Term Goal 2 (Week 1): Pt will perform sit<>stand transfers using LRAD with min assist PT Short Term Goal 3 (Week 1): Pt will perform stand pivot transfers using LRAD with min assist PT Short Term Goal 4 (Week 1): Pt will ambulate at least 160ft using LRAD with min assist  Skilled Therapeutic Interventions/Progress Updates:     Pt received supine in bed and agrees to therapy. Oriented to self and place. No complaint of pain. Supine to sit slowly with bed features and cues for positioning. Sit to stand with modA and cues for hand placement and facilitation of anterior weight shift. Ambulatory transfer to [facility] with cues for upright gaze and RW management for safety. WC transport to gym. Pt ambulates x175' with RW and CGA, with cues for upright gaze to improve posture and balance. Following seated rest break, pt transfers to Nustep with minA. Pt completes total of 11:00 on Nustep with 2 brief rest breaks, at workload of 4 with average steps per minute ~40. Performed for strength and endurance training. Pt transfers back to [facility] with minA and cues for hand placement and body mechanics. WC transport back to room. Left seated with alarm intact and all needs within reach.  Therapy Documentation Precautions:  Precautions Precautions: Fall Precaution Comments: NPO with PEG tube, posterior lean Restrictions Weight Bearing Restrictions: No   Therapy/Group: Individual Therapy  Breck Coons, PT, DPT 04/10/2021, 3:45 PM

## 2021-04-10 NOTE — Progress Notes (Signed)
Pt and daughter request that IV be removed. P Love, PA okay with d/c of IV. Pt tolerated well.

## 2021-04-10 NOTE — Progress Notes (Signed)
Speech Language Pathology Daily Session Note  Patient Details  Name: Gregory [name] MRN: [id number] Date of Birth: [birth year]  Today's Date: 04/10/2021 SLP Individual Time: 7062-3762 SLP Individual Time Calculation (min): 44 min  Short Term Goals: Week 1: SLP Short Term Goal 1 (Week 1): Patient will utilize an increased vocal intensity at the phrase level with Mod A multimodal cues. SLP Short Term Goal 2 (Week 1): Patient and family will verbalize reasoning/procedures for ice chips with Mod verbal cues. SLP Short Term Goal 3 (Week 1): Patient will demonstrate orientation X 4 with Mod verbal and visual cues. SLP Short Term Goal 4 (Week 1): Patient will answer questions approrpriately in 25% of opportunities with Mod A multimodal cues.  Skilled Therapeutic Interventions:Skilled ST services focused on swallow and speech skills. SLP facilitated RMT reassessment, pt scored an average of 24cm in IMST and 53cm in EMST both indicate meaningful weakness. SLP will initiate IMST next session starting resistance at 17cm and EMST device once available on CIR. Pt completed oral care at the sink with set up assist, piror to x8 ice chips while pt completed x8 CTAR. Pt demonstrated no overt aspiration, only "hocking" mucus outside of the ice chip trials. SLP  provided min A to transfer pt via RW to recliner chair per request. SLP changed batteries in right hearing aid per request.Pt was left in room with call bell within reach and chair alarm set. SLP recommends to continue skilled services.     Pain Pain Assessment Pain Scale: 0-10 Pain Score: 0-No pain  Therapy/Group: Individual Therapy  [name]  [facility] 04/10/2021, 12:21 PM

## 2021-04-11 LAB — GLUCOSE, CAPILLARY
Glucose-Capillary: 92 mg/dL (ref 70–99)
Glucose-Capillary: 100 mg/dL — ABNORMAL HIGH (ref 70–99)
Glucose-Capillary: 124 mg/dL — ABNORMAL HIGH (ref 70–99)

## 2021-04-11 NOTE — Progress Notes (Signed)
Pt rested most of the night with exception to rounding and assisting with the urinal and to shift Pts weight to prevent further skin breakdown. Pt didn't have any agitation or distress over the night. Medications tolerated well and Pt is stable at this time.

## 2021-04-11 NOTE — Progress Notes (Signed)
Occupational Therapy Session Note  Patient Details  Name: Gregory [name] MRN: [id number] Date of Birth: February 28, 1920  Today's Date: 04/11/2021 OT Individual Time: 7989-2119 OT Individual Time Calculation (min): 60 min    Short Term Goals: Week 1:  OT Short Term Goal 1 (Week 1): Pt will perform BSC/toilet transfer with LRAD and Min A OT Short Term Goal 2 (Week 1): Pt will complete UB dress with Mod A OT Short Term Goal 3 (Week 1): Pt will complete 1/3 components of donning pants using AE or adaptive techniques as needed  Skilled Therapeutic Interventions/Progress Updates:    Pt resting in bed upon arrival with son present. OT intervention with focus on bed mobility, sit<>stand, standing balance, grooming at sink, functional transfers, and activity tolerance to increase independence with BADLs. Supine>sit EOB with supervision. Sit<>stand and stand pivot transfers with CGA. Pt completed grooming tasks at sink with supervision. Pt agreeable to taking shower tomorrow. Pt transitioned to gym and engaged in standing activities with close supervision. Pt also engaged in BLE therex with ankle weights (2# ) kicking therapy ball 3x12. Pt returned to room and remained in w/c. All needs within reach and son present.   Therapy Documentation Precautions:  Precautions Precautions: Fall Precaution Comments: NPO with PEG tube, posterior lean Restrictions Weight Bearing Restrictions: No Pain: Pain Assessment Pain Scale: 0-10 Pain Score: 0-No pain Faces Pain Scale: No hurt   Therapy/Group: Individual Therapy  Leroy Libman 04/11/2021, 11:03 AM

## 2021-04-11 NOTE — Progress Notes (Signed)
Speech Language Pathology Discharge Summary  Patient Details  Name: Gregory [name] MRN: [id number] Date of Birth: 10-16-1920  Today's Date: 04/11/2021 SLP Individual Time: 3570-1779 SLP Individual Time Calculation (min): 43 min   Skilled Therapeutic Interventions:  Skilled ST services focused on education, swallow and speech skills. SLP facilitated use of IMST set at 17cm H20, pt completed x10 and reported "its ok" when given a self-effort scale of 1-10. Pt demonstrated ability to increase vocal intensity on command, however continues to demonstrate low vocal intensity during conversation, pt supports this is baseline. Pt was able to complete or car with set sup and consumed x5 ice chips with no overt s/s aspiration. SLP provided education to pt and pt's daughter pertaining to ice chip protocol for comfort (pt and pt's daughter both verbalized protocol in teach back), swallow and speech exercise CTAR and IMST. Pt and pt's family support cognition, swallow and speech is at baseline. All questions answered to satisfaction. Pt was left in room with family, call bell within reach and chair alarm set.    Patient has met 4 of 4 long term goals.  Patient to discharge at [facility] level.  Reasons goals not met:     Clinical Impression/Discharge Summary:   Pt made great progress meeting 4 out 4 goals, discharging at min-supervision A baseline function. SLP facilitated administration and education of ice chip protocol for comfort/pleasure only. SLP continues to recommend NPO status. SLP provided IMST device to aid in vocal intensity and CTAR swallow exercise to strengthen swallow and reduce risk of aspiration. Pt's cognitive skills now appear at baseline function with initial confusion noted. Education has been completed and all questions answered.  Pt benefited from skilled ST services in order to maximize functional independence and reduce burden of care, requiring 24 hour supervision at discharge.     Care Partner:  Caregiver Able to Provide Assistance: Yes  Type of Caregiver Assistance: Physical;Cognitive  Recommendation:  24 hour supervision/assistance      Equipment: N/A   Reasons for discharge: Treatment goals met   Patient/Family Agrees with Progress Made and Goals Achieved: Yes    Johnryan Sao 04/11/2021, 4:59 PM

## 2021-04-11 NOTE — Progress Notes (Signed)
Occupational Therapy Session Note  Patient Details  Name: Gregory [name] MRN: [id number] Date of Birth: 02-20-1920  Today's Date: 04/11/2021 OT Individual Time: 1350-1415 OT Individual Time Calculation (min): 25 min    Short Term Goals: Week 1:  OT Short Term Goal 1 (Week 1): Pt will perform BSC/toilet transfer with LRAD and Min A OT Short Term Goal 2 (Week 1): Pt will complete UB dress with Mod A OT Short Term Goal 3 (Week 1): Pt will complete 1/3 components of donning pants using AE or adaptive techniques as needed  Skilled Therapeutic Interventions/Progress Updates:    Pt received sitting up in the w/c with no c/o pain. RN present initially to administer peg feeding. Provided info to pt's daughter on CLOF while RN finished. Pt declined need for ADLs. Pt was taken via w/c to the therapy gym. He completed sit > stand with min A using the RW. Ambulatory transfer to the mat with min A. Shuffling gait present, cueing for increasing stride length and more upright posture. Pt completed blocked practice sit <> stands from EOM with no UE support for carryover to ADL transfers and LE strengthening/endurance. He required min-mod A as he fatigued. He returned to his room and was left sitting up with all needs met. Daughter present.   Therapy Documentation Precautions:  Precautions Precautions: Fall Precaution Comments: NPO with PEG tube, posterior lean Restrictions Weight Bearing Restrictions: No   Therapy/Group: Individual Therapy  Curtis Sites 04/11/2021, 6:22 AM

## 2021-04-11 NOTE — Discharge Instructions (Addendum)
°  Inpatient Rehab Discharge Instructions  Gregory [name] Discharge date and time: 04/14/21   Activities/Precautions/ Functional Status: Activity: activity as tolerated Diet:  Nothing by mouth. A couple of ice chips via spoon after oral care.  Wound Care: keep wound clean and dry   Functional status:  ___ No restrictions     ___ Walk up steps independently _X__ 24/7 supervision/assistance   ___ Walk up steps with assistance ___ Intermittent supervision/assistance  ___ Bathe/dress independently ___ Walk with walker     _X__ Bathe/dress with assistance ___ Walk Independently    ___ Shower independently _X__ Walk with assistance    ___ Shower with assistance _X__ No alcohol     ___ Return to work/school ________   Special Instructions: Need to call the [place] today or Monday to set follow up appointment.    COMMUNITY REFERRALS UPON DISCHARGE:    Home Health:   PT      OT      ST     RN                        [place] [phone]; [facility] # [phone] *Please expect follow-up within 2-3 days to schedule your home visit. If you have not received follow-up, please be sure to call the liaison Malachy Mood first, and then call the [employer].    Medical Equipment/Items Ordered:portable suction; hospital bed provided while in acute hospital                                                 Agency/Supplier:[facility] [phone]  Medical Equipment/Items Ordered:enteral feeds (Novasource; equivalent to Nepro)                                                 Agency/Supplier: [facility] [phone]  Enteral Feedings (tube feeds) Nepro formula- volume at 315 ml (10.5 ounces) given three times per day. (Total of 4 cartons/day)   Free water flushes of 300 ml every 6 hours per tube.    Tube feeding regimen to provide 1701 kcal, 77 grams of protein, and 1888 ml total free water.      My questions have been answered and I understand  these instructions. I will adhere to these goals and the provided educational materials after my discharge from the hospital.  Patient/Caregiver Signature _______________________________ Date __________  Clinician Signature _______________________________________ Date __________  Please bring this form and your medication list with you to all your follow-up doctor's appointments.

## 2021-04-11 NOTE — Progress Notes (Signed)
Patient ID: Curt Oatis, male   DOB: 08-Sep-1920, 88 y.o.   MRN: [id number]  SW met with pt in room to provide updates from team conference, and discuss discharge date being 3/3. Reports his daughter [name] will be here later on today.  *SW met with pt and pt dtr [name] in room to provide updates from team conference, and d/c date. SW reviewed d/c discussing HHA, homecare agency, and ordering TF. SW will follow-up with agencies.   SW updated [name]/[facility] and [name]/[facility] on patient discharge.   SW spoke with [place] with [facility] [phone]) to inform on patient discharge date. Reports sending information to rep who is assigned to case since they were only waiting for a discharge date.   [name], MSW, [place] Office: [phone] Cell: [phone] Fax: [phone]

## 2021-04-11 NOTE — Progress Notes (Signed)
Physical Therapy Session Note  Patient Details  Name: Gregory [name] MRN: [id number] Date of Birth: Jul 22, 1920  Today's Date: 04/11/2021 PT Individual Time: 1130-1200; 1440-1530 PT Individual Time Calculation (min): 30 min and 50 min  Short Term Goals: Week 1:  PT Short Term Goal 1 (Week 1): Pt will perform supine<>sit with min assist PT Short Term Goal 2 (Week 1): Pt will perform sit<>stand transfers using LRAD with min assist PT Short Term Goal 3 (Week 1): Pt will perform stand pivot transfers using LRAD with min assist PT Short Term Goal 4 (Week 1): Pt will ambulate at least 125ft using LRAD with min assist  Skilled Therapeutic Interventions/Progress Updates:    Session 1: Pt received seated in w/c in room, agreeable to PT session. No complaints of pain. Sit to stand with min A to RW during session. Ambulation 2 x 150 ft with RW and CGA for balance. Pt exhibits flexed trunk and shuffling gait pattern, cues for upright posture. Pt left seated in w/c in room with needs in reach, son present.  Session 2: Pt received seated in w/c in room, agreeable to PT session. No complaints of pain. Sit to stand with min A to RW during session, cues for anterior weight shift and safe UE placement during transfer. Ambulation x 90 ft with RW and CGA for balance, cues for upright posture. Sit to stand 2 x 5 reps with RW and min A with focus on decreasing posterior lean during transfer. Ascend/descend 3 x 6" stairs laterally with R handrail to simulate home environment, mod A due to decreased safety with this method and knees buckling. Discussed pt's home setup with him and his daughter, pt prefers to enter his home from back door with one step to enter then one step-up into the house, however pt has no handrails at this entrance and relies on support from doorframe and screen door behind him to hold himself up. Attempted to educate pt on safety regarding stair navigation, not very receptive to education and  prefers to do things how he did them prior to hospital admission. Will continue to work towards a safe d/c home. Pt left seated in w/c in room with needs in reach, daughter present.  Therapy Documentation Precautions:  Precautions Precautions: Fall Precaution Comments: NPO with PEG tube, posterior lean Restrictions Weight Bearing Restrictions: No     Therapy/Group: Individual Therapy   Excell Seltzer, PT, DPT, CSRS  04/11/2021, 12:07 PM

## 2021-04-11 NOTE — Patient Care Conference (Signed)
Inpatient RehabilitationTeam Conference and Plan of Care Update Date: 04/11/2021   Time: 11:08 AM    Patient Name: Gregory [name]      Medical Record Number: [id number]  Date of Birth: 1920/02/26 Sex: Male         Room/Bed: 4M10C/4M10C-01 Payor Info: Payor: MEDICARE / Plan: MEDICARE PART A AND B / Product Type: *No Product type* /    Admit Date/Time:  04/05/2021  2:31 PM  Primary Diagnosis:  [facility] Problems: Principal Problem:   Debility Active Problems:   Hypokalemia   Stage 3a chronic kidney disease ([place])   Delirium    Expected Discharge Date: Expected Discharge Date: 04/14/21  Team Members Present: Physician leading conference: Dr. Courtney Heys Social Worker Present: [name], [place] Nurse Present: Dorthula Nettles, RN PT Present: Excell Seltzer, PT OT Present: Roanna Epley, [place], OT SLP Present: Charolett Bumpers, SLP PPS Coordinator present : Gunnar Fusi, SLP     Current Status/Progress Goal Weekly Team Focus  Bowel/Bladder   Continent times 2 last BM 2/27  Remain continent  Offer toilet, urinal, or bed pan Q 2 hours and keep Pt clean and dry.   Swallow/Nutrition/ Hydration   NPO, ice chips with ST and PRN with family  Supervision A  education, CTAR and tolerance   ADL's   bed mobility-min A; sit<>stand and functional tranfers-min A; bathing/dressing-mod A sink level  supervision/min A overall  endurance, BADLs, transfers, educaiton   Mobility   min to mod A bed mobility, min A sit to stand, transfers CGA with RW, gait up to 150 ft with RW CGA, min A stairs with 2 handrails  Supervision to CGA overall  endurance, balance, strengthening, functional mobility   Communication   reduced speech intelligibility mod-min A  Min A  IMST and EMST when available   Safety/Cognition/ Behavioral Observations  d/c cognitive goals at baseline  d/c at baseline      Pain   Pt denies any pain  Assess for pain  assess for new or ongoing pain manage  pain at acceptable level.   Skin   Reddness on bottom protected by a foam dressing  Assess skin and interventions to keep the skin clean dry and intact        Discharge Planning:  D/c to home with his son [name] who will provide 24/7 care; PRN assistance from dtr Alpha. Pt on TF prior to admission and will remain on TF at discharge. HHA- [facility] for HHPT/OT/SLP/aide/SN. Pt has hospital bed ordered through [facility] in place. Enteral Feed change from Nutren 1.5 to Nepro. [facility] to provide new TF changes. Pt will ahve homecare with [facility].   Team Discussion: No restraints, cognition improved. BUN/Cr back to baseline. Labs good. Incontinent B/B. No reported pain. Continue skin care for pressure injuries. HOH, son broke 1 hearing aid, new battery for remaining aid addressed. Discharging home with son, 24/7 care to be provided. IV discontinued. Patient on target to meet rehab goals: yes, CGA to supervision goals. Plans to shower tomorrow. Back to baseline with SLP.  *See Care Plan and progress notes for long and short-term goals.   Revisions to Treatment Plan:  Monitoring labs, adjusting medications, finalizing discharge plans.   Teaching Needs: Family education, bowel/bladder management, skin/wound care, transfer/gait training, etc.   Current Barriers to Discharge: Incontinence  Possible Resolutions to Barriers: Family education Time toileting schedule     Medical Summary Current Status: incontinent B/B?; LBM ?; no pain- skin issues  the same- no change so far; son broke 1 hearing aid; needed new battery on other one; IV taken out;  Barriers to Discharge: Decreased family/caregiver support;Home enviroment access/layout;Medical stability;Incontinence;Wound care;Nutrition means  Barriers to Discharge Comments: IV out- going home with daughter, son to help- Possible Resolutions to [employer] Focus: back to where he was CGA-min A- >150 ft- shower tomorrow-  cognition back to baseline; got new ICS; SLP 3x/week; d/c Friday 3/3   Continued Need for Acute Rehabilitation Level of Care: The patient requires daily medical management by a physician with specialized training in physical medicine and rehabilitation for the following reasons: Direction of a multidisciplinary physical rehabilitation program to maximize functional independence : Yes Medical management of patient stability for increased activity during participation in an intensive rehabilitation regime.: Yes Analysis of laboratory values and/or radiology reports with any subsequent need for medication adjustment and/or medical intervention. : Yes   I attest that I was present, lead the team conference, and concur with the assessment and plan of the team.   Cristi Loron 04/11/2021, 4:34 PM

## 2021-04-11 NOTE — Progress Notes (Signed)
PROGRESS NOTE   Subjective/Complaints: Pt reports he just woke up, however has no issues.  Wants to go back to sleep- please close door on way out!  ROS: limited due to [facility] and sleepiness Objective:   No results found. Recent Labs    04/10/21 0737  WBC 7.3  HGB 8.6*  HCT 26.2*  PLT 161   Recent Labs    04/10/21 0737  NA 138  K 3.6  CL 104  CO2 27  GLUCOSE 99  BUN 23  CREATININE 1.12  CALCIUM 9.0   No intake or output data in the 24 hours ending 04/11/21 0807    Pressure Injury 03/03/21 Heel Left Deep Tissue Pressure Injury - Purple or maroon localized area of discolored intact skin or blood-filled blister due to damage of underlying soft tissue from pressure and/or shear. (Active)  03/03/21 1114  Location: Heel  Location Orientation: Left  Staging: Deep Tissue Pressure Injury - Purple or maroon localized area of discolored intact skin or blood-filled blister due to damage of underlying soft tissue from pressure and/or shear.  Wound Description (Comments):   Present on Admission: Yes     Pressure Injury 03/10/21 Toe (Comment  which one) Anterior;Left;Distal Unstageable - Full thickness tissue loss in which the base of the injury is covered by slough (yellow, tan, gray, green or brown) and/or eschar (tan, brown or black) in the wound bed. (Active)  03/10/21 1730  Location: Toe (Comment  which one) (Left great toe at the tip.)  Location Orientation: Anterior;Left;Distal  Staging: Unstageable - Full thickness tissue loss in which the base of the injury is covered by slough (yellow, tan, gray, green or brown) and/or eschar (tan, brown or black) in the wound bed.  Wound Description (Comments): Black area at the tip of the left great toe.  Present on Admission: Yes     Pressure Injury 03/10/21 Toe (Comment  which one) Anterior;Right;Distal Unstageable - Full thickness tissue loss in which the base of the injury is  covered by slough (yellow, tan, gray, green or brown) and/or eschar (tan, brown or black) in the wound bed (Active)  03/10/21 1730  Location: Toe (Comment  which one) (Right great toe at the tip of the toe.)  Location Orientation: Anterior;Right;Distal  Staging: Unstageable - Full thickness tissue loss in which the base of the injury is covered by slough (yellow, tan, gray, green or brown) and/or eschar (tan, brown or black) in the wound bed.  Wound Description (Comments): Right great toe at the tip of the toe.  Present on Admission: Yes     Pressure Injury 04/03/21 Heel Right Deep Tissue Pressure Injury - Purple or maroon localized area of discolored intact skin or blood-filled blister due to damage of underlying soft tissue from pressure and/or shear. (Active)  04/03/21 2000  Location: Heel  Location Orientation: Right  Staging: Deep Tissue Pressure Injury - Purple or maroon localized area of discolored intact skin or blood-filled blister due to damage of underlying soft tissue from pressure and/or shear.  Wound Description (Comments):   Present on Admission:     Physical Exam: Vital Signs Blood pressure (!) 136/54, pulse 69, temperature 98 F (36.7  C), resp. rate 14, height 5\' 8"  (1.727 m), weight 80.9 kg, SpO2 94 %.     General: awake, alert, appropriate, no agitation; no mittens/restraints; just woke up; NAD HENT: conjugate gaze; oropharynx dry CV: regular rate; no JVD Pulmonary: CTA B/L; no W/R/R- good air movement GI: soft, NT, ND, (+)BS Psychiatric: appropriate but sleepy Neurological: alert; but sleepy- very HOH_ no hearing aids in [facility] Skin: Comments: Bilateral great toes with dark eschar.    Assessment/Plan: 1. Functional deficits which require 3+ hours per day of interdisciplinary therapy in a comprehensive inpatient rehab setting. Physiatrist is providing close team supervision and 24 hour management of active medical problems listed below. Physiatrist and rehab  team continue to assess barriers to discharge/monitor patient progress toward functional and medical goals  Care Tool:  Bathing  Bathing activity did not occur: Safety/medical concerns Body parts bathed by patient: Right arm, Left arm   Body parts bathed by helper: Front perineal area     Bathing assist Assist Level: Maximal Assistance - Patient 24 - 49%     Upper Body Dressing/Undressing Upper body dressing   What is the patient wearing?: Pull over shirt    Upper body assist Assist Level: Maximal Assistance - Patient 25 - 49%    Lower Body Dressing/Undressing Lower body dressing      What is the patient wearing?: Pants     Lower body assist Assist for lower body dressing: Maximal Assistance - Patient 25 - 49%     Toileting Toileting    Toileting assist Assist for toileting: Total Assistance - Patient < 25%     Transfers Chair/bed transfer  Transfers assist     Chair/bed transfer assist level: Minimal Assistance - Patient > 75%     Locomotion Ambulation   Ambulation assist   Ambulation activity did not occur: Safety/medical concerns  Assist level: Contact Guard/Touching assist Assistive device: Walker-rolling Max distance: 150'   Walk 10 feet activity   Assist  Walk 10 feet activity did not occur: Safety/medical concerns  Assist level: Contact Guard/Touching assist Assistive device: Walker-rolling   Walk 50 feet activity   Assist Walk 50 feet with 2 turns activity did not occur: Safety/medical concerns  Assist level: Contact Guard/Touching assist Assistive device: Walker-rolling    Walk 150 feet activity   Assist Walk 150 feet activity did not occur: Safety/medical concerns  Assist level: Contact Guard/Touching assist Assistive device: Walker-rolling    Walk 10 feet on uneven surface  activity   Assist Walk 10 feet on uneven surfaces activity did not occur: Safety/medical concerns   Assist level: 2 helpers Assistive device:  Aeronautical engineer Is the patient using a wheelchair?: No Type of Wheelchair: Manual           Wheelchair 50 feet with 2 turns activity    Assist            Wheelchair 150 feet activity     Assist          Blood pressure (!) 136/54, pulse 69, temperature 98 F (36.7 C), resp. rate 14, height 5\' 8"  (1.727 m), weight 80.9 kg, SpO2 94 %.  Medical Problem List and Plan: 1. Functional deficits secondary to debility s/p COVID with delirium  Con't CIR_ PT, OT and SLP- team conference today to determine length of stay 2.  Antithrombotics: -DVT/anticoagulation:  Pharmaceutical: Lovenox             -antiplatelet therapy: N/A 3. Pain  Management: Tylenol prn.  4. Mood: LCSW to follow for evaluation and support.              -antipsychotic agents: N/A 5. Neuropsych: This patient is not capable of making decisions on his own behalf. 6. Skin/Wound Care: Routine pressure relief measures             --air mattress. Prevalon boots and foam dressing on ankles.  7. Fluids/Electrolytes/Nutrition:  Monitor renal status with routine checks             --pre-renal azotemia has improved with increase in water flushes  2/24- BUN down slightly to 33- con't regimen with water flushes and TF's.   2/27- BUN down to 23 and Cr 1.12- so back to baseline- doing better with current water flushes- con't regimen 8. Covid 19: Treated with Remdesvir and steroids.  9. Delirium:              --also exacerbated by steroids and recent infection.              --will order sleep wake chart. Consistent routine.  --Monitor for hospital psychosis with prolonged LOS.  Add seroquel HS. 2/23- required mittens last night since pulling at lines and PEG- will get abd binder and con't mittens as required/restraints required to prevent damage to pt by himself. Will verify has sleep wake chart done.   2/24- per nursing ,slept better- out of mittens- will monitor closely and use restraints  if required to protect pt/PEG. ?  Improving on 2/25 2/27- not requiring mittens anymore- doing better- con't regimen 2/28- Not requiring restraints anymore- appears improved/resolved 10.  Sleep disturbance: Will order sleep chart.  --Had trazodone and Seroquel ineffective  --Added melatonin as sedated this am.  11. Dysphagia: Continue n.p.o. with ice chips after oral care.             --Nephro TID with water flushes qid.  12. GIB: H/H stable. Continue Iron supplement.             --on Carafate and protonix  Hemoglobin 8.3 on 2/24, continue to monitor  2/28- Hb 8.6- con't reigmen/monoitoring 13. CAF/CHB  s/p PPM: Monitor HR TID.  14. Constipation: Continue Senna S at bedtime.  15. Acute on chronic renal failure: Improved and likely at baseline.   Creatinine 1.19 on 2/23, continue to monitor  2/27- Cr 1.12 and BUN down to 23- con't regimen 16. Tremors: d/c trazodone since ineffective anyway.   17. Hypokalemia  Potassium 3.3 on 2/23, additional supplement ordered, labs ordered for Monday  2/27- K+ 3.6- doing better  I spent a total of 37    minutes on total care today- >50% coordination of care- due to  team conference today and d/w nursing- no restraints required now     LOS: 6 days A FACE TO FACE EVALUATION WAS PERFORMED  Sadiya Durand 04/11/2021, 8:07 AM

## 2021-04-12 LAB — GLUCOSE, CAPILLARY
Glucose-Capillary: 62 mg/dL — ABNORMAL LOW (ref 70–99)
Glucose-Capillary: 113 mg/dL — ABNORMAL HIGH (ref 70–99)
Glucose-Capillary: 122 mg/dL — ABNORMAL HIGH (ref 70–99)
Glucose-Capillary: 98 mg/dL (ref 70–99)
Glucose-Capillary: 108 mg/dL — ABNORMAL HIGH (ref 70–99)
Glucose-Capillary: 107 mg/dL — ABNORMAL HIGH (ref 70–99)
Glucose-Capillary: 112 mg/dL — ABNORMAL HIGH (ref 70–99)

## 2021-04-12 NOTE — Progress Notes (Addendum)
RN rounding complete. Pt is asleep waken to assess needs and offer urinal. 350 ml  urine output clear yellow urine. Pt weight shifted. No other needs at this time. ? ?Pt CBG noted at 62. 4 OZ of OJ given via PEG tube and will recheck in 15 minutes. Pt is asymptomatic.  ? ?6825: CBG recheck: 113. Pt denies any distress. ? ?0315 CBG rechecked 122. Pt denies any distress. ? ?0600 CBG 98. Pt had an uneventful night and rested well without any distress. ?

## 2021-04-12 NOTE — Progress Notes (Signed)
PROGRESS NOTE   Subjective/Complaints:  Pt reports feels good this AM-  Had CBG of 62 overnight. Asymptomatic;    ROS: limited due to [facility], sleepiness Objective:   No results found. Recent Labs    04/10/21 0737  WBC 7.3  HGB 8.6*  HCT 26.2*  PLT 161   Recent Labs    04/10/21 0737  NA 138  K 3.6  CL 104  CO2 27  GLUCOSE 99  BUN 23  CREATININE 1.12  CALCIUM 9.0    Intake/Output Summary (Last 24 hours) at 04/12/2021 0810 Last data filed at 04/12/2021 0700 Gross per 24 hour  Intake 0 ml  Output 2000 ml  Net -2000 ml      Pressure Injury 03/03/21 Heel Left Deep Tissue Pressure Injury - Purple or maroon localized area of discolored intact skin or blood-filled blister due to damage of underlying soft tissue from pressure and/or shear. (Active)  03/03/21 1114  Location: Heel  Location Orientation: Left  Staging: Deep Tissue Pressure Injury - Purple or maroon localized area of discolored intact skin or blood-filled blister due to damage of underlying soft tissue from pressure and/or shear.  Wound Description (Comments):   Present on Admission: Yes     Pressure Injury 03/10/21 Toe (Comment  which one) Anterior;Left;Distal Unstageable - Full thickness tissue loss in which the base of the injury is covered by slough (yellow, tan, gray, green or brown) and/or eschar (tan, brown or black) in the wound bed. (Active)  03/10/21 1730  Location: Toe (Comment  which one) (Left great toe at the tip.)  Location Orientation: Anterior;Left;Distal  Staging: Unstageable - Full thickness tissue loss in which the base of the injury is covered by slough (yellow, tan, gray, green or brown) and/or eschar (tan, brown or black) in the wound bed.  Wound Description (Comments): Black area at the tip of the left great toe.  Present on Admission: Yes     Pressure Injury 03/10/21 Toe (Comment  which one) Anterior;Right;Distal Unstageable - Full  thickness tissue loss in which the base of the injury is covered by slough (yellow, tan, gray, green or brown) and/or eschar (tan, brown or black) in the wound bed (Active)  03/10/21 1730  Location: Toe (Comment  which one) (Right great toe at the tip of the toe.)  Location Orientation: Anterior;Right;Distal  Staging: Unstageable - Full thickness tissue loss in which the base of the injury is covered by slough (yellow, tan, gray, green or brown) and/or eschar (tan, brown or black) in the wound bed.  Wound Description (Comments): Right great toe at the tip of the toe.  Present on Admission: Yes     Pressure Injury 04/03/21 Heel Right Deep Tissue Pressure Injury - Purple or maroon localized area of discolored intact skin or blood-filled blister due to damage of underlying soft tissue from pressure and/or shear. (Active)  04/03/21 2000  Location: Heel  Location Orientation: Right  Staging: Deep Tissue Pressure Injury - Purple or maroon localized area of discolored intact skin or blood-filled blister due to damage of underlying soft tissue from pressure and/or shear.  Wound Description (Comments):   Present on Admission:     Physical  Exam: Vital Signs Blood pressure (!) 115/49, pulse 69, temperature 98.4 F (36.9 C), resp. rate 14, height 5\' 8"  (1.727 m), weight 80.9 kg, SpO2 94 %.      General: awake, alert, appropriate, just woke up; NAD HENT: conjugate gaze; oropharynx dry CV: regular rate; no JVD Pulmonary: CTA B/L; no W/R/R- good air movement GI: soft, NT, ND, (+)BS Psychiatric: appropriate- but very sleepy  Neurological: sleepy Skin: Comments: Bilateral great toes with dark eschar.    Assessment/Plan: 1. Functional deficits which require 3+ hours per day of interdisciplinary therapy in a comprehensive inpatient rehab setting. Physiatrist is providing close team supervision and 24 hour management of active medical problems listed below. Physiatrist and rehab team continue to  assess barriers to discharge/monitor patient progress toward functional and medical goals  Care Tool:  Bathing  Bathing activity did not occur: Safety/medical concerns Body parts bathed by patient: Right arm, Left arm   Body parts bathed by helper: Front perineal area     Bathing assist Assist Level: Maximal Assistance - Patient 24 - 49%     Upper Body Dressing/Undressing Upper body dressing   What is the patient wearing?: Pull over shirt    Upper body assist Assist Level: Maximal Assistance - Patient 25 - 49%    Lower Body Dressing/Undressing Lower body dressing      What is the patient wearing?: Pants     Lower body assist Assist for lower body dressing: Maximal Assistance - Patient 25 - 49%     Toileting Toileting    Toileting assist Assist for toileting: Total Assistance - Patient < 25%     Transfers Chair/bed transfer  Transfers assist     Chair/bed transfer assist level: Minimal Assistance - Patient > 75%     Locomotion Ambulation   Ambulation assist   Ambulation activity did not occur: Safety/medical concerns  Assist level: Contact Guard/Touching assist Assistive device: Walker-rolling Max distance: 150'   Walk 10 feet activity   Assist  Walk 10 feet activity did not occur: Safety/medical concerns  Assist level: Contact Guard/Touching assist Assistive device: Walker-rolling   Walk 50 feet activity   Assist Walk 50 feet with 2 turns activity did not occur: Safety/medical concerns  Assist level: Contact Guard/Touching assist Assistive device: Walker-rolling    Walk 150 feet activity   Assist Walk 150 feet activity did not occur: Safety/medical concerns  Assist level: Contact Guard/Touching assist Assistive device: Walker-rolling    Walk 10 feet on uneven surface  activity   Assist Walk 10 feet on uneven surfaces activity did not occur: Safety/medical concerns   Assist level: 2 helpers Assistive device: Horticulturist, commercial Is the patient using a wheelchair?: No Type of Wheelchair: Manual           Wheelchair 50 feet with 2 turns activity    Assist            Wheelchair 150 feet activity     Assist          Blood pressure (!) 115/49, pulse 69, temperature 98.4 F (36.9 C), resp. rate 14, height 5\' 8"  (1.727 m), weight 80.9 kg, SpO2 94 %.  Medical Problem List and Plan: 1. Functional deficits secondary to debility s/p COVID with delirium  Con't CIR- d/c date 3/3- Friday.  2.  Antithrombotics: -DVT/anticoagulation:  Pharmaceutical: Lovenox             -antiplatelet therapy: N/A 3. Pain Management: Tylenol prn.  4. Mood: LCSW to follow for evaluation and support.              -antipsychotic agents: N/A 5. Neuropsych: This patient is not capable of making decisions on his own behalf.   3/1- more approrpaite, alert  6. Skin/Wound Care: Routine pressure relief measures             --air mattress. Prevalon boots and foam dressing on ankles.  7. Fluids/Electrolytes/Nutrition:  Monitor renal status with routine checks             --pre-renal azotemia has improved with increase in water flushes  2/24- BUN down slightly to 33- con't regimen with water flushes and TF's.   2/27- BUN down to 23 and Cr 1.12- so back to baseline- doing better with current water flushes- con't regimen 8. Covid 19: Treated with Remdesvir and steroids.  9. Delirium:              --also exacerbated by steroids and recent infection.              --will order sleep wake chart. Consistent routine.  --Monitor for hospital psychosis with prolonged LOS.  Add seroquel HS. 2/23- required mittens last night since pulling at lines and PEG- will get abd binder and con't mittens as required/restraints required to prevent damage to pt by himself. Will verify has sleep wake chart done.   2/24- per nursing ,slept better- out of mittens- will monitor closely and use restraints if required to protect  pt/PEG. ?  Improving on 2/25 2/27- not requiring mittens anymore- doing better- con't regimen 2/28- Not requiring restraints anymore- appears improved/resolved 3/1- no issues 10.  Sleep disturbance: Will order sleep chart.  --Had trazodone and Seroquel ineffective  --Added melatonin as sedated this am.  11. Dysphagia: Continue n.p.o. with ice chips after oral care.             --Nephro TID with water flushes qid.  12. GIB: H/H stable. Continue Iron supplement.             --on Carafate and protonix  Hemoglobin 8.3 on 2/24, continue to monitor  2/28- Hb 8.6- con't reigmen/monoitoring 13. CAF/CHB  s/p PPM: Monitor HR TID.  14. Constipation: Continue Senna S at bedtime.  15. Acute on chronic renal failure: Improved and likely at baseline.   Creatinine 1.19 on 2/23, continue to monitor  2/27- Cr 1.12 and BUN down to 23- con't regimen 16. Tremors: d/c trazodone since ineffective anyway.   17. Hypokalemia  Potassium 3.3 on 2/23, additional supplement ordered, labs ordered for Monday  2/27- K+ 3.6- doing better 18. Hypoglycemia  3/1- was 69 last night- asymptomatic- on no meds or SSI- being checked due to TF's- con't checking- first time dropped.       LOS: 7 days A FACE TO FACE EVALUATION WAS PERFORMED  Cinzia Devos 04/12/2021, 8:10 AM

## 2021-04-12 NOTE — Progress Notes (Signed)
Physical Therapy Session Note ? ?Patient Details  ?Name: Gregory [name] ?MRN: [id number] ?Date of Birth: [birth year] ? ?Today's Date: 04/12/2021 ?PT Individual Time: 1400-1500 ?PT Individual Time Calculation (min): 60 min  ? ?Short Term Goals: ?Week 1:  PT Short Term Goal 1 (Week 1): Pt will perform supine<>sit with min assist ?PT Short Term Goal 2 (Week 1): Pt will perform sit<>stand transfers using LRAD with min assist ?PT Short Term Goal 3 (Week 1): Pt will perform stand pivot transfers using LRAD with min assist ?PT Short Term Goal 4 (Week 1): Pt will ambulate at least 130ft using LRAD with min assist ? ?Skilled Therapeutic Interventions/Progress Updates:  ?  Pt received seated in w/c in room, agreeable to PT session. No complaints of pain. Pt reports urge to urinate. Sit to stand with min A to RW, dependent for clothing management and placement of urinal. Pt able to continently void in urinal while standing with RW. Ambulation x 150 ft with RW and CGA for balance, cues for upright posture and pt exhibits shuffling gait pattern. Ascend/descend 4 x 6" stairs with 2 handrails and min A, step-to gait pattern. Ascend/descend 4 x 6" stairs laterally with L handrail and mod A for balance due to initial buckling of knees when ascending stairs. Pt exhibits improved strength and control as he progresses. Sit to stand 2 x 5 reps to RW with focus on safe hand placement and anterior weight shift. Pt initially requires mod A for sit to stand with posterior lean, improves to CGA. Pt left seated in w/c in room with needs in reach, daughter present at end of session. ? ?Therapy Documentation ?Precautions:  ?Precautions ?Precautions: Fall ?Precaution Comments: NPO with PEG tube, posterior lean ?Restrictions ?Weight Bearing Restrictions: No ? ? ? ? ? ? ?Therapy/Group: Individual Therapy ? ? ?Excell Seltzer, PT, DPT, CSRS ? ?04/12/2021, 5:00 PM  ?

## 2021-04-12 NOTE — Progress Notes (Signed)
Occupational Therapy Session Note ? ?Patient Details  ?Name: Gregory [name] ?MRN: [id number] ?Date of Birth: 1920/09/02 ? ?Today's Date: 04/12/2021 ?OT Individual Time: 0930-1040 ?OT Individual Time Calculation (min): 70 min  ? ? ?Short Term Goals: ?Week 1:  OT Short Term Goal 1 (Week 1): Pt will perform BSC/toilet transfer with LRAD and Min A ?OT Short Term Goal 2 (Week 1): Pt will complete UB dress with Mod A ?OT Short Term Goal 3 (Week 1): Pt will complete 1/3 components of donning pants using AE or adaptive techniques as needed ? ?Skilled Therapeutic Interventions/Progress Updates:  ?  Pt sleeping in bed upon arrival with son present. Pt easily aroused. Son left room during bathing/dressing tasks. Pt agreeable to bathing at shower level. Supine>sit EOB with supervision using bed rails. Stand pivot transfers X 4 with CGA. Bathing at shower level (sit<>stand) with min A. Sit<>stand at sink X 4. RN changed Mepilex dressings. Pt requires assistance with fastening buttons. Pt requires more then a reasonable amount of time to complete all tasks. Standing balance with CGA Pt reports that he is pleased to be going home on 3/3. Pt remained in w/c with belt alarm activated. All needs within reach and son present.  ? ?Therapy Documentation ?Precautions:  ?Precautions ?Precautions: Fall ?Precaution Comments: NPO with PEG tube, posterior lean ?Restrictions ?Weight Bearing Restrictions: No ? ?Pain: ? Pt denies pain this morning ? ? ?Therapy/Group: Individual Therapy ? ?Leroy Libman ?04/12/2021, 10:41 AM ?

## 2021-04-12 NOTE — Progress Notes (Signed)
Occupational Therapy Session Note ? ?Patient Details  ?Name: Gregory [name] ?MRN: [id number] ?Date of Birth: 1920-04-09 ? ?Today's Date: 04/12/2021 ?OT Individual Time: 7425-9563 ?OT Individual Time Calculation (min): 53 min  ? ? ?Short Term Goals: ?Week 1:  OT Short Term Goal 1 (Week 1): Pt will perform BSC/toilet transfer with LRAD and Min A ?OT Short Term Goal 2 (Week 1): Pt will complete UB dress with Mod A ?OT Short Term Goal 3 (Week 1): Pt will complete 1/3 components of donning pants using AE or adaptive techniques as needed ? ? ?Skilled Therapeutic Interventions/Progress Updates:  ?  Pt greeted at time of session sitting up in wheelchair with daughter present who remained for session. Pt initially stating he would do "whatever" and feeling fatigued but agreeable to OT session out of the room. Politely declined toileting multiple times throughout session. Daughter asking if pt could have ice chips and since there are no sign in the room, spoke with PA who confirmed the pt can have up to 10 ice chips per day with oral care prior to eating ice chips. Pt transported room <> ortho gym and set up on SCIFIT on level 3 for 10:30 in forward direction to improve cardio endurance and UB strength with pt verbose and talking about past work experience, service in [employer], etc. Pt transported back to room and performed oral care with Set up, OT providing 3 ice chips according to PA instructions with no coughing noted. Relayed current level of function to daughter and answered her questions regarding DC home. Pt up in wheelchair call bell in reach all needs met.  ? ?Therapy Documentation ?Precautions:  ?Precautions ?Precautions: Fall ?Precaution Comments: NPO with PEG tube, posterior lean ?Restrictions ?Weight Bearing Restrictions: No ? ? ? ? ?Therapy/Group: Individual Therapy ? ?Viona Gilmore ?04/12/2021, 4:28 PM ?

## 2021-04-12 NOTE — Progress Notes (Signed)
Physical Therapy Discharge Summary ? ?Patient Details  ?Name: Gregory [name] ?MRN: [id number] ?Date of Birth: 05/18/1920 ? ?Today's Date: 04/14/2021 ? ?   ? ? ?Patient has met 7 of 8 long term goals due to improved activity tolerance, improved balance, increased strength, and ability to compensate for deficits.  Patient to discharge at an ambulatory level Supervision for transfers and CGA to close supervision for ambulation.   Patient's care partner is independent to provide the necessary physical assistance at discharge, family anticipates hiring caregivers along with son present for supervision. ? ?Reasons goals not met: Pt continues to require minA for ascending stairs.  ? ?Recommendation:  ?Patient will benefit from ongoing skilled PT services in home health setting to continue to advance safe functional mobility, address ongoing impairments in endurance, strength, balance, safety, independence with functional mobility, and minimize fall risk. ? ?Equipment: ?No equipment provided. Pt already owns RW. ? ?Reasons for discharge: treatment goals met and discharge from hospital ? ?Patient/family agrees with progress made and goals achieved: Yes ? ?PT Discharge ?Precautions/Restrictions ?Precautions ?Precautions: Fall ?Precaution Comments: NPO with PEG tube, posterior lean ?Restrictions ?Weight Bearing Restrictions: No ?Vital Signs ? ?Pain ?Pain Assessment ?Pain Scale: 0-10 ?Pain Score: 0-No pain ?Faces Pain Scale: No hurt ?Pain Interference ?Pain Interference ?Pain Effect on Sleep: 1. Rarely or not at all ?Pain Interference with Therapy Activities: 1. Rarely or not at all ?Pain Interference with Day-to-Day Activities: 1. Rarely or not at all ?Vision/Perception  ?Vision - History ?Ability to See in Adequate Light: 0 Adequate ?Perception ?Perception: Within Functional Limits ?Praxis ?Praxis: Intact  ?Cognition ?Overall Cognitive Status: History of cognitive impairments - at baseline ?Arousal/Alertness:  Awake/alert ?Orientation Level: Oriented to person;Oriented to place;Oriented to time ?Year: 2023 ?Month: February ?Day of Week: Incorrect ?Attention: Sustained ?Focused Attention: Appears intact ?Sustained Attention: Appears intact ?Memory: Impaired ?Immediate Memory Recall: Sock;Blue;Bed ?Memory Recall Sock: Without Cue ?Memory Recall Blue: Without Cue ?Memory Recall Bed: Without Cue ?Awareness: Impaired ?Awareness Impairment: Emergent impairment ?Problem Solving: Appears intact ?Safety/Judgment: Appears intact ?Sensation ?Sensation ?Light Touch: Impaired Detail ?Peripheral sensation comments: impaired distally in feet bilaterally ?Light Touch Impaired Details: Impaired RLE;Impaired LLE ?Hot/Cold: Appears Intact ?Proprioception: Impaired by gross assessment ?Coordination ?Gross Motor Movements are Fluid and Coordinated: No ?Fine Motor Movements are Fluid and Coordinated: No ?Coordination and Movement Description: Affected by balance impairments and baseline tremulous activity in hands bilaterally ?Finger Nose Finger Test: tremulous bilaterally ?Motor  ?Motor ?Motor: Abnormal postural alignment and control ?Motor - Discharge Observations: Continues to demonstrate weakness but improved from eval  ?Mobility ?Bed Mobility ?Bed Mobility: Supine to Sit;Sit to Supine ?Transfers ?Sit to Stand: Contact Guard/Touching assist ?Stand to Sit: Contact Guard/Touching assist ?Stand Pivot Transfers: Contact Guard/Touching assist ?Transfer (Assistive device): Rolling walker ?Locomotion  ?Gait ?Ambulation: Yes ?Gait Assistance: Contact Guard/Touching assist ?Gait Distance (Feet): 150 Feet ?Assistive device: Rolling walker ?Gait ?Gait: Yes ?Gait Pattern: Impaired ?Gait Pattern: Narrow base of support;Decreased step length - left;Decreased step length - right;Decreased stride length;Lateral trunk lean to left;Trunk flexed;Poor foot clearance - left;Poor foot clearance - right  ?Trunk/Postural Assessment  ?Cervical Assessment ?Cervical  Assessment: Exceptions to [facility] (forward head) ?Thoracic Assessment ?Thoracic Assessment: Exceptions to [facility] (kyphotic) ?Lumbar Assessment ?Lumbar Assessment: Exceptions to [facility] (posterior pelvic tilt) ?Postural Control ?Postural Control: Deficits on evaluation ?Righting Reactions: delayed, posterior lean present ?Protective Responses: delayed  ?Balance ?Balance ?Balance Assessed: Yes ?Static Sitting Balance ?Static Sitting - Balance Support: Feet supported ?Static Sitting - Level of Assistance: 5: Stand by assistance ?Dynamic Sitting  Balance ?Dynamic Sitting - Balance Support: During functional activity ?Dynamic Sitting - Level of Assistance: 5: Stand by assistance ?Static Standing Balance ?Static Standing - Balance Support: Bilateral upper extremity supported ?Static Standing - Level of Assistance: 5: Stand by assistance ?Dynamic Standing Balance ?Dynamic Standing - Balance Support: Bilateral upper extremity supported ?Dynamic Standing - Level of Assistance: 5: Stand by assistance ?Dynamic Standing - Balance Activities: Diona Foley toss ?Dynamic Standing - Comments: mild posteiror lean noted but pt was able to correct ?Extremity Assessment  ?RUE Assessment ?Active Range of Motion (AROM) Comments: Shoulder mobility ~90 degrees and tremulous throughout functional activity ?LUE Assessment ?Active Range of Motion (AROM) Comments: Shoulder mobility ~90 degrees and tremulous throughout functional activity ?RLE Assessment ?RLE Assessment: Exceptions to [facility] ?General Strength Comments: Grossly 4/5 ?LLE Assessment ?LLE Assessment: Exceptions to [facility] ?General Strength Comments: Grossly 4/5 ? ? ? ?Gregory [name] ?04/13/2021, 3:15 PM ?

## 2021-04-13 LAB — GLUCOSE, CAPILLARY
Glucose-Capillary: 103 mg/dL — ABNORMAL HIGH (ref 70–99)
Glucose-Capillary: 111 mg/dL — ABNORMAL HIGH (ref 70–99)
Glucose-Capillary: 126 mg/dL — ABNORMAL HIGH (ref 70–99)
Glucose-Capillary: 144 mg/dL — ABNORMAL HIGH (ref 70–99)

## 2021-04-13 MED ORDER — QUETIAPINE FUMARATE 25 MG PO TABS: 12.5000 mg | ORAL_TABLET | Freq: Every evening | ORAL | Status: DC | PRN

## 2021-04-13 NOTE — Progress Notes (Signed)
Occupational Therapy Discharge Summary ? ?Patient Details  ?Name: Gregory [name] ?MRN: [id number] ?Date of Birth: [birth year] ? ?Patient has met 10 of 10 long term goals due to improved activity tolerance, improved balance, postural control, ability to compensate for deficits, improved attention, and improved awareness.  Pt made steady progress with BADLs and functional transfers since returning from Acute. Pt requires min A for bathing, LB dressing, and toileting tasks, in addition to TTB transfers. Pt completes all other ADLs with supervision including toilt transfers. Pt fatigues quickly and requires multiple rest breaks when performing ADLs. Pt's son and daughter have been present during therapy sessions and have made arrangements for home aides to assist at home. Patient to discharge at overall Supervision level.  Patient's care partner is independent to provide the necessary physical assistance at discharge.   ? ?Reasons goals not met: n/a  ? ?Recommendation:  ?Patient will benefit from ongoing skilled OT services in home health setting to continue to advance functional skills in the area of BADL and Reduce care partner burden. ? ?Equipment: ?No equipment provided ? ?Reasons for discharge: treatment goals met and discharge from hospital ? ?Patient/family agrees with progress made and goals achieved: Yes ? ?OT Discharge ?ADL ?ADL ?Eating: NPO ?Grooming: Supervision/safety ?Where Assessed-Grooming: Sitting at sink, Wheelchair ?Upper Body Bathing: Supervision/safety ?Where Assessed-Upper Body Bathing: Sitting at sink, Wheelchair ?Lower Body Bathing: Minimal assistance ?Where Assessed-Lower Body Bathing: Sitting at sink, Wheelchair, Standing at sink ?Upper Body Dressing: Supervision/safety ?Where Assessed-Upper Body Dressing: Sitting at sink, Wheelchair ?Lower Body Dressing: Minimal assistance ?Where Assessed-Lower Body Dressing: Sitting at sink, Standing at sink, Wheelchair ?Toileting: Minimal assistance ?Where  Assessed-Toileting: Toilet ?Toilet Transfer: Close supervision ?Toilet Transfer Method: Ambulating ?Science writer: Grab bars ?Tub/Shower Transfer: Minimal assistance ?Tub/Shower Transfer Method: Stand pivot, Transfer board ?Tub/Shower Equipment: Radio broadcast assistant ?Vision ?Baseline Vision/History: 1 Wears glasses ?Patient Visual Report: No change from baseline ?Vision Assessment?: No apparent visual deficits ?Perception  ?Perception: Within Functional Limits ?Praxis ?Praxis: Intact ?Cognition ?Overall Cognitive Status: History of cognitive impairments - at baseline ?Arousal/Alertness: Awake/alert ?Orientation Level: Oriented to person;Oriented to place;Oriented to time ?Year: 2023 ?Month: February ?Day of Week: Incorrect ?Attention: Sustained ?Focused Attention: Appears intact ?Sustained Attention: Appears intact ?Memory: Impaired ?Immediate Memory Recall: Sock;Blue;Bed ?Memory Recall Sock: Without Cue ?Memory Recall Blue: Without Cue ?Memory Recall Bed: Without Cue ?Awareness: Impaired ?Awareness Impairment: Emergent impairment ?Problem Solving: Appears intact ?Safety/Judgment: Appears intact ?Sensation ?Sensation ?Light Touch: Impaired Detail ?Peripheral sensation comments: impaired distally in feet bilaterally ?Light Touch Impaired Details: Impaired RLE;Impaired LLE ?Hot/Cold: Appears Intact ?Proprioception: Impaired by gross assessment ?Coordination ?Gross Motor Movements are Fluid and Coordinated: No ?Fine Motor Movements are Fluid and Coordinated: No ?Coordination and Movement Description: Affected by balance impairments and baseline tremulous activity in hands bilaterally ?Finger Nose Finger Test: tremulous bilaterally ?Motor  ?Motor ?Motor: Abnormal postural alignment and control ?Trunk/Postural Assessment  ?Cervical Assessment ?Cervical Assessment: Exceptions to [facility] (forward head) ?Thoracic Assessment ?Thoracic Assessment: Exceptions to [facility] (rounded shoulders) ?Lumbar Assessment ?Lumbar  Assessment:  (posterior pelvic tilt) ?Postural Control ?Righting Reactions: delayed and insufficient with posterior lean bias  ?Balance ?Static Sitting Balance ?Static Sitting - Balance Support: Feet supported ?Static Sitting - Level of Assistance: 5: Stand by assistance ?Dynamic Sitting Balance ?Dynamic Sitting - Balance Support: During functional activity ?Dynamic Sitting - Level of Assistance: 5: Stand by assistance ?Extremity/Trunk Assessment ?RUE Assessment ?Active Range of Motion (AROM) Comments: Shoulder mobility ~90 degrees and tremulous throughout functional activity ?LUE Assessment ?Active Range of Motion (AROM)  Comments: Shoulder mobility ~90 degrees and tremulous throughout functional activity ? ? ?Leroy Libman ?04/13/2021, 2:59 PM ?

## 2021-04-13 NOTE — Progress Notes (Signed)
PROGRESS NOTE   Subjective/Complaints:  Pt reports doesn't know how doing since just woke up.     DHR:CBULAGT due to [facility] /sleepiness Objective:   No results found. No results for input(s): WBC, HGB, HCT, PLT in the last 72 hours.  No results for input(s): NA, K, CL, CO2, GLUCOSE, BUN, CREATININE, CALCIUM in the last 72 hours.   Intake/Output Summary (Last 24 hours) at 04/13/2021 0802 Last data filed at 04/13/2021 0145 Gross per 24 hour  Intake --  Output 800 ml  Net -800 ml      Pressure Injury 03/03/21 Heel Left Deep Tissue Pressure Injury - Purple or maroon localized area of discolored intact skin or blood-filled blister due to damage of underlying soft tissue from pressure and/or shear. (Active)  03/03/21 1114  Location: Heel  Location Orientation: Left  Staging: Deep Tissue Pressure Injury - Purple or maroon localized area of discolored intact skin or blood-filled blister due to damage of underlying soft tissue from pressure and/or shear.  Wound Description (Comments):   Present on Admission: Yes     Pressure Injury 03/10/21 Toe (Comment  which one) Anterior;Left;Distal Unstageable - Full thickness tissue loss in which the base of the injury is covered by slough (yellow, tan, gray, green or brown) and/or eschar (tan, brown or black) in the wound bed. (Active)  03/10/21 1730  Location: Toe (Comment  which one) (Left great toe at the tip.)  Location Orientation: Anterior;Left;Distal  Staging: Unstageable - Full thickness tissue loss in which the base of the injury is covered by slough (yellow, tan, gray, green or brown) and/or eschar (tan, brown or black) in the wound bed.  Wound Description (Comments): Black area at the tip of the left great toe.  Present on Admission: Yes     Pressure Injury 03/10/21 Toe (Comment  which one) Anterior;Right;Distal Unstageable - Full thickness tissue loss in which the base of the injury  is covered by slough (yellow, tan, gray, green or brown) and/or eschar (tan, brown or black) in the wound bed (Active)  03/10/21 1730  Location: Toe (Comment  which one) (Right great toe at the tip of the toe.)  Location Orientation: Anterior;Right;Distal  Staging: Unstageable - Full thickness tissue loss in which the base of the injury is covered by slough (yellow, tan, gray, green or brown) and/or eschar (tan, brown or black) in the wound bed.  Wound Description (Comments): Right great toe at the tip of the toe.  Present on Admission: Yes     Pressure Injury 04/03/21 Heel Right Deep Tissue Pressure Injury - Purple or maroon localized area of discolored intact skin or blood-filled blister due to damage of underlying soft tissue from pressure and/or shear. (Active)  04/03/21 2000  Location: Heel  Location Orientation: Right  Staging: Deep Tissue Pressure Injury - Purple or maroon localized area of discolored intact skin or blood-filled blister due to damage of underlying soft tissue from pressure and/or shear.  Wound Description (Comments):   Present on Admission:     Physical Exam: Vital Signs Blood pressure (!) 131/54, pulse 73, temperature 97.7 F (36.5 C), temperature source Oral, resp. rate 16, height 5\' 8"  (1.727 m), weight  79.5 kg, SpO2 95 %.       General: awake, alert, appropriate, just woke up; HOHNAD HENT: conjugate gaze; oropharynx moist CV: regular rate; no JVD Pulmonary: CTA B/L; no W/R/R- good air movement GI: soft, NT, ND, (+)BS Psychiatric: appropriate Neurological: HOH-   Skin: Comments: Bilateral great toes with dark eschar.   L temple- dried blood/scab  Assessment/Plan: 1. Functional deficits which require 3+ hours per day of interdisciplinary therapy in a comprehensive inpatient rehab setting. Physiatrist is providing close team supervision and 24 hour management of active medical problems listed below. Physiatrist and rehab team continue to assess  barriers to discharge/monitor patient progress toward functional and medical goals  Care Tool:  Bathing  Bathing activity did not occur: Safety/medical concerns Body parts bathed by patient: Right arm, Left arm, Chest, Abdomen, Front perineal area, Buttocks, Right upper leg, Left upper leg, Face   Body parts bathed by helper: Left upper leg, Right lower leg     Bathing assist Assist Level: Minimal Assistance - Patient > 75%     Upper Body Dressing/Undressing Upper body dressing   What is the patient wearing?: Pull over shirt    Upper body assist Assist Level: Supervision/Verbal cueing    Lower Body Dressing/Undressing Lower body dressing      What is the patient wearing?: Pants     Lower body assist Assist for lower body dressing: Minimal Assistance - Patient > 75%     Toileting Toileting    Toileting assist Assist for toileting: Total Assistance - Patient < 25%     Transfers Chair/bed transfer  Transfers assist     Chair/bed transfer assist level: Minimal Assistance - Patient > 75%     Locomotion Ambulation   Ambulation assist   Ambulation activity did not occur: Safety/medical concerns  Assist level: Contact Guard/Touching assist Assistive device: Walker-rolling Max distance: 150'   Walk 10 feet activity   Assist  Walk 10 feet activity did not occur: Safety/medical concerns  Assist level: Contact Guard/Touching assist Assistive device: Walker-rolling   Walk 50 feet activity   Assist Walk 50 feet with 2 turns activity did not occur: Safety/medical concerns  Assist level: Contact Guard/Touching assist Assistive device: Walker-rolling    Walk 150 feet activity   Assist Walk 150 feet activity did not occur: Safety/medical concerns  Assist level: Contact Guard/Touching assist Assistive device: Walker-rolling    Walk 10 feet on uneven surface  activity   Assist Walk 10 feet on uneven surfaces activity did not occur: Safety/medical  concerns   Assist level: 2 helpers Assistive device: Aeronautical engineer Is the patient using a wheelchair?: No Type of Wheelchair: Manual           Wheelchair 50 feet with 2 turns activity    Assist            Wheelchair 150 feet activity     Assist          Blood pressure (!) 131/54, pulse 73, temperature 97.7 F (36.5 C), temperature source Oral, resp. rate 16, height 5\' 8"  (1.727 m), weight 79.5 kg, SpO2 95 %.  Medical Problem List and Plan: 1. Functional deficits secondary to debility s/p COVID with delirium  Con't CIR_ PT, OT and SLP- d/c 3/3  2.  Antithrombotics: -DVT/anticoagulation:  Pharmaceutical: Lovenox             -antiplatelet therapy: N/A 3. Pain Management: Tylenol prn.  4. Mood: LCSW to follow for  evaluation and support.              -antipsychotic agents: N/A 5. Neuropsych: This patient is not capable of making decisions on his own behalf.   3/2- more appropriate- more alert 6. Skin/Wound Care: Routine pressure relief measures             --air mattress. Prevalon boots and foam dressing on ankles.  7. Fluids/Electrolytes/Nutrition:  Monitor renal status with routine checks             --pre-renal azotemia has improved with increase in water flushes  2/24- BUN down slightly to 33- con't regimen with water flushes and TF's.   2/27- BUN down to 23 and Cr 1.12- so back to baseline- doing better with current water flushes- con't regimen 8. Covid 19: Treated with Remdesvir and steroids. 3/2- resolved  9. Delirium:              --also exacerbated by steroids and recent infection.              --will order sleep wake chart. Consistent routine.  --Monitor for hospital psychosis with prolonged LOS.  Add seroquel HS. 2/23- required mittens last night since pulling at lines and PEG- will get abd binder and con't mittens as required/restraints required to prevent damage to pt by himself. Will verify has sleep wake chart  done.   2/24- per nursing ,slept better- out of mittens- will monitor closely and use restraints if required to protect pt/PEG. ?  Improving on 2/25 2/27- not requiring mittens anymore- doing better- con't regimen 2/28- Not requiring restraints anymore- appears improved/resolved 3/1- no issues  3/2- change seroquel 12.5 mg to QHS prn/as needed for agitation  10.  Sleep disturbance: Will order sleep chart.  --Had trazodone and Seroquel ineffective  --Added melatonin as sedated this am.  11. Dysphagia: Continue n.p.o. with ice chips after oral care.             --Nephro TID with water flushes qid.  12. GIB: H/H stable. Continue Iron supplement.             --on Carafate and protonix  Hemoglobin 8.3 on 2/24, continue to monitor  2/28- Hb 8.6- con't reigmen/monoitoring 13. CAF/CHB  s/p PPM: Monitor HR TID.  14. Constipation: Continue Senna S at bedtime.  15. Acute on chronic renal failure: Improved and likely at baseline.   Creatinine 1.19 on 2/23, continue to monitor  2/27- Cr 1.12 and BUN down to 23- con't regimen 16. Tremors: d/c trazodone since ineffective anyway.   17. Hypokalemia  Potassium 3.3 on 2/23, additional supplement ordered, labs ordered for Monday  2/27- K+ 3.6- doing better 18. Hypoglycemia  3/1- was 69 last night- asymptomatic- on no meds or SSI- being checked due to TF's- con't checking- first time dropped.   3/2- CBGs look good- well controlled on TF's.       LOS: 8 days A FACE TO FACE EVALUATION WAS PERFORMED  Deleon Passe 04/13/2021, 8:02 AM

## 2021-04-13 NOTE — Progress Notes (Signed)
Patient ID: Treyce Spillers, male   DOB: March 16, 1920, 88 y.o.   MRN: [id number] ? ?SW spoke with [place] with [facility] (p:[phone]/[phone]) to discuss status of delivery of enteral feeds. Reports that enteral feeds will be shipped out tomorrow.  ? ?*SW provided pt with 5 days worth of Nepro (20 cartons) and reviewed with ptr brother discharge. SW informed will bring 5 additional cartons for discharge.  ? ?SW later brought 5 addtl cartons and provided to his dtr [name]. She has questions about how much to administer per feeding. SW informed will share with nursing.  ? ?SW informed nursing about her concern.  ? ?[name], MSW, LCSWA ?Office: [phone] ?Cell: [phone] ?Fax: [phone]  ?

## 2021-04-13 NOTE — Discharge Summary (Signed)
Physician Discharge Summary  Patient ID: Gregory [name] MRN: [id number] DOB/AGE: Dec 22, 1920 88 y.o.  Admit date: 04/05/2021 Discharge date: 04/14/2021  Discharge Diagnoses:  Principal Problem:   Debility Active Problems:   CRI (chronic renal insufficiency), stage 3 (moderate) (HCC)   Dysphagia s/p G tube    Protein malnutrition (HCC)   Stage 3a chronic kidney disease (HCC)   Hypophosphatemia   Discharged Condition: stable  Significant Diagnostic Studies: N/A   Labs:  Basic Metabolic Panel: BMP Latest Ref Rng & Units 04/10/2021 04/06/2021 04/05/2021  Glucose 70 - 99 mg/dL 99 103(H) 132(H)  BUN 8 - 23 mg/dL 23 33(H) 35(H)  Creatinine 0.61 - 1.24 mg/dL 1.12 1.19 1.08  BUN/Creat Ratio 10 - 24 - - -  Sodium 135 - 145 mmol/L 138 137 139  Potassium 3.5 - 5.1 mmol/L 3.6 3.3(L) 3.9  Chloride 98 - 111 mmol/L 104 102 102  CO2 22 - 32 mmol/L 27 28 25   Calcium 8.9 - 10.3 mg/dL 9.0 8.5(L) 8.9     CBC: CBC Latest Ref Rng & Units 04/10/2021 04/07/2021 04/06/2021  WBC 4.0 - 10.5 K/uL 7.3 7.0 8.1  Hemoglobin 13.0 - 17.0 g/dL 8.6(L) 8.3(L) 8.0(L)  Hematocrit 39.0 - 52.0 % 26.2(L) 26.0(L) 24.4(L)  Platelets 150 - 400 K/uL 161 163 132(L)     CBG: Recent Labs  Lab 04/13/21 1118 04/13/21 1625 04/13/21 2048 04/14/21 0021 04/14/21 0501  GLUCAP 144* 111* 106* 113* 94    Brief HPI:   Gregory [name] is a 88 y.o. male with history of a flutter, HTN, neuropathy, dysphagia with significant weight loss requiring G-tube placement at [facility] in December 2022.  He was admitted to [facility] on 01/19 with GI bleed requiring 2 units PRBCs, Eliquis was discontinued and conservative care recommended by GI.  He was noted to have functional decline and admitted to CIR on 01/27 for intensive rehab program to consist of PT, OT and ST. GI and cardiology were consulted for input on Eliquis and  recommended discontinuation of Eliquis due to ongoing bleeding with heme positive stools as well as variable H&H.   He was kept  n.p.o. due to severe to moderate pharyngeal dysphagia with silent aspiration but started on RMT.   Hospital course was significant for UTI as well as fevers due to COVID infection.  He was discharged to acute floor on 02/16 and treated with remdesivir and steroids but has had issues with delirium and confusion.  As he had completed treatment for COVID-19,  he was  cleared to resume rehab.     Hospital Course: Gregory [name] was admitted to rehab 04/05/2021 for inpatient therapies to consist of PT, ST and OT at least three hours five days a week. Past admission physiatrist, therapy team and rehab RN have worked together to provide customized collaborative inpatient rehab.  His blood pressures were monitored on TID basis and have been controlled. His blood sugars been monitored with ac/hs CBG checks due to being on tube feeds and have been reasonably controlled.  He is tolerating adjustment of diet to 325 cc 3 times daily to help as well as increasing water to 200 cc 4 times daily.  Serial check of electrolytes shows renal status and potassium levels to be within normal limits.    He was noted to have low phosphorus levels likely due to malnutrition and he continues on K-Phos for supplementation. He continues on iron with serial CBC showing H&H to be stable. At discharge, family had questions regarding use/resumption of  Eliquis and recommendations of GI/cardiology were reviewed with both son and daughter.  They were advised to take medications as prescribed at discharge and to follow-up with PCP and cardiology for further discussion.  Trazodone was discontinued due to tremors. Delirium/agitation felt to be secondary to steroids has resolved with improvement in mentation as well as sleep-wake disruption.  Seroquel was weaned down and family advised to transition it to as needed as patient gets acclimated back at home.  He continues to be limited by fatigue requiring multiple rest breaks with ADLs.  He requires  supervision overall.  He will continue to receive follow up [place], [place], [place], [place] and [facility] after discharge.    Rehab course: During patient's stay in rehab weekly team conferences were held to monitor patient's progress, set goals and discuss barriers to discharge. At admission, patient required max assist with basic ADLs and mod assist with mobility. He continues to exhibit severe dysphagia with low vocal intensity and lethargy. He  has had improvement in activity tolerance, balance, postural control as well as ability to compensate for deficits.  He requires min assist for bathing lower body dressing and toileting tasks.  He requires supervision for transfers and contact-guard assist to close supervision to ambulate 150 feet with rolling walker. He is tolerating 5 ice chips without overt signs of aspiration for comfort/pleasure only.  Cognitive skills and voice volume is at baseline.  Family education was completed regarding all aspects of safety and care.  Disposition: Home  Diet: NPO  Special Instructions: Continue RMST.  Oral care prior to small amounts of ice chips for pleasure. Nephro 315 cc TID with water flushes 300 cc qid 4.  Recommend repeat CBC and BMET in 1-2 weeks to follow-up on renal status and H&H.   Allergies as of 04/14/2021       Reactions   Cefepime Other (See Comments)   Severe myoclonus for cefepime- don't take again-    Amlodipine Besylate    Other reaction(s): edema   Penicillins Hives, Rash        Medication List     STOP taking these medications    diphenhydrAMINE 12.5 MG/5ML elixir Commonly known as: BENADRYL   enoxaparin 30 MG/0.3ML injection Commonly known as: LOVENOX   prochlorperazine 10 MG/2ML injection Commonly known as: COMPAZINE   prochlorperazine 25 MG suppository Commonly known as: COMPAZINE   prochlorperazine 5 MG tablet Commonly known as: COMPAZINE       TAKE these medications    acetaminophen 325 MG  tablet Commonly known as: TYLENOL Place 1-2 tablets (325-650 mg total) into feeding tube every 4 (four) hours as needed for mild pain.   alum & mag hydroxide-simeth 200-200-20 MG/5ML suspension Commonly known as: MAALOX/MYLANTA Place 30 mLs into feeding tube every 4 (four) hours as needed for indigestion.   atorvastatin 40 MG tablet Commonly known as: LIPITOR Place 1 tablet (40 mg total) into feeding tube at bedtime. What changed: Another medication with the same name was removed. Continue taking this medication, and follow the directions you see here.   bisacodyl 10 MG suppository Commonly known as: DULCOLAX Place 1 suppository (10 mg total) rectally daily as needed for moderate constipation.   chlorhexidine 0.12 % solution Commonly known as: PERIDEX 15 mLs by Mouth Rinse route 2 (two) times daily.   feeding supplement (NEPRO CARB STEADY) Liqd Place 315 mLs into feeding tube 3 (three) times daily.   ferrous sulfate 220 (44 Fe) MG/5ML solution Place 5 mLs (  220 mg total) into feeding tube 2 (two) times daily with a meal.   free water Soln Place 300 mLs into feeding tube 4 (four) times daily. Notes to patient: Use filtered or sterile water (not distilled water)   guaiFENesin-dextromethorphan 100-10 MG/5ML syrup Commonly known as: ROBITUSSIN DM Place 5-10 mLs into feeding tube every 6 (six) hours as needed for cough.   levothyroxine 75 MCG tablet Commonly known as: SYNTHROID Place 1 tablet (75 mcg total) into feeding tube daily at 6 (six) AM.   lip balm ointment Apply topically as needed for lip care.   melatonin 5 MG Tabs Place 1 tablet (5 mg total) into feeding tube at bedtime as needed.   mouth rinse Liqd solution 15 mLs by Mouth Rinse route 4 (four) times daily.   pantoprazole sodium 40 mg Commonly known as: PROTONIX Place 40 mg into feeding tube 2 (two) times daily before a meal.   phosphorus 155-852-130 MG tablet Commonly known as: K PHOS NEUTRAL Place 1  tablet (250 mg total) into feeding tube daily.   polyethylene glycol 17 g packet Commonly known as: MIRALAX / GLYCOLAX Place 17 g into feeding tube daily as needed for mild constipation.   QUEtiapine 25 MG tablet Commonly known as: SEROQUEL Place 0.5 tablets (12.5 mg total) into feeding tube at bedtime. Notes to patient: Can change to as needed for sleep/anxiety   senna-docusate 8.6-50 MG tablet Commonly known as: Senokot-S Place 2 tablets into feeding tube at bedtime.   sucralfate 1 GM/10ML suspension Commonly known as: CARAFATE Place 10 mLs (1 g total) into feeding tube every 8 (eight) hours.        Follow-up Information     L[place], Jinny Blossom, MD Follow up.   Specialty: Physical Medicine and Rehabilitation Why: As needed Contact information: [address] [place] [address] [phone]         Orpah Greek, DO. Call today.   Specialty: Internal Medicine Why: for post hospital follow up Contact information: [place] [address] [phone]         [place], Peter M, MD. Call today.   Specialty: Cardiology Why: for follow up appointment in 3-4 weeks Contact information: [address] [place] [address] [phone]         Earnie Larsson, MD. Call today.   Specialty: Gastroenterology Why: for follow up and any issues with PEG Contact information: [place] [address] [phone]                 Signed: Bary Leriche 04/17/2021, 6:22 PM

## 2021-04-13 NOTE — Progress Notes (Signed)
Inpatient Rehabilitation Discharge Medication Review by a Pharmacist ? ?A complete drug regimen review was completed for this patient to identify any potential clinically significant medication issues. ? ?High Risk Drug Classes Is patient taking? Indication by Medication  ?Antipsychotic Yes Seroquel- sleep  ?Anticoagulant No   ?Antibiotic No   ?Opioid No   ?Antiplatelet No   ?Hypoglycemics/insulin No   ?Vasoactive Medication No   ?Chemotherapy No   ?Other Yes Lipitor- HLD ?Synthroid- hypothyroidism ?Protonix- GERD ?Melatonin- sleep  ? ? ? ?Type of Medication Issue Identified Description of Issue Recommendation(s)  ?Drug Interaction(s) (clinically significant) ?    ?Duplicate Therapy ?    ?Allergy ?    ?No Medication Administration End Date ?    ?Incorrect Dose ?    ?Additional Drug Therapy Needed ?    ?Significant med changes from prior encounter (inform family/care partners about these prior to discharge).    ?Other ?    ? ? ?Clinically significant medication issues were identified that warrant physician communication and completion of prescribed/recommended actions by midnight of the next day:  No ? ?Time spent performing this drug regimen review (minutes):  30 ? ? ?Miracle Criado BS, PharmD, BCPS ?Clinical Pharmacist ?04/13/2021 1:34 PM ? ? ? ? ?

## 2021-04-13 NOTE — Progress Notes (Signed)
Physical Therapy Session Note ? ?Patient Details  ?Name: Gregory [name] ?MRN: [id number] ?Date of Birth: 1920-09-06 ? ?Today's Date: 04/13/2021 ?PT Individual Time: 8315-1761 ?PT Individual Time Calculation (min): 60 min  ? ?Short Term Goals: ?Week 1:  PT Short Term Goal 1 (Week 1): Pt will perform supine<>sit with min assist ?PT Short Term Goal 2 (Week 1): Pt will perform sit<>stand transfers using LRAD with min assist ?PT Short Term Goal 3 (Week 1): Pt will perform stand pivot transfers using LRAD with min assist ?PT Short Term Goal 4 (Week 1): Pt will ambulate at least 160ft using LRAD with min assist ? ?Skilled Therapeutic Interventions/Progress Updates: Pt presented in w/c agreeable to therapy. Pt denies pain during  session. Session focus on functional mobility in preparation for d/c. Pt transported to ortho gym and performed car transfer requiring min for BLE management to get into simulator. Pt then transported to day room and participated in standing balance activity with another pt participating in ball toss with pt maintaining overall good balance until becoming fatigued and increasing posteiror bias. Pt was able to correct lean with CGA only. Pt ambulated ~133ft with RW and CGA nearing close supervision. Pt ambulates with narrow BOS, forward flexed posture, B knee flexion, and decreased B foot clearance. Pt transported remaining distance to room and performed ambulatory transfer to bed. At EOB pt performed sit to supine with supervision and increased time. Pt performed rolling L/R with bed rail and supervision to allow PTA to place pillows under backside. Pt repositioned to comfort and left with call bell within reach and dgt present.  ?   ? ?Therapy Documentation ?Precautions:  ?Precautions ?Precautions: Fall ?Precaution Comments: NPO with PEG tube, posterior lean ?Restrictions ?Weight Bearing Restrictions: No ?General: ?  ?Vital Signs: ? ?Pain: ?Pain Assessment ?Pain Scale: 0-10 ?Pain Score: 0-No  pain ?Faces Pain Scale: No hurt ?Mobility: ?Bed Mobility ?Bed Mobility: Supine to Sit;Sit to Supine ?Transfers ?Sit to Stand: Contact Guard/Touching assist ?Stand to Sit: Contact Guard/Touching assist ?Stand Pivot Transfers: Contact Guard/Touching assist ?Transfer (Assistive device): Rolling walker ?Locomotion : ?Gait ?Ambulation: Yes ?Gait Assistance: Contact Guard/Touching assist ?Gait Distance (Feet): 150 Feet ?Assistive device: Rolling walker ?Gait ?Gait: Yes ?Gait Pattern: Impaired ?Gait Pattern: Narrow base of support;Decreased step length - left;Decreased step length - right;Decreased stride length;Lateral trunk lean to left;Trunk flexed;Poor foot clearance - left;Poor foot clearance - right  ?Trunk/Postural Assessment : ?Cervical Assessment ?Cervical Assessment: Exceptions to [facility] (forward head) ?Thoracic Assessment ?Thoracic Assessment: Exceptions to [facility] (kyphotic) ?Lumbar Assessment ?Lumbar Assessment: Exceptions to [facility] (posterior pelvic tilt) ?Postural Control ?Postural Control: Deficits on evaluation ?Righting Reactions: delayed, posterior lean present ?Protective Responses: delayed  ?Balance: ?Balance ?Balance Assessed: Yes ?Static Sitting Balance ?Static Sitting - Balance Support: Feet supported ?Static Sitting - Level of Assistance: 5: Stand by assistance ?Dynamic Sitting Balance ?Dynamic Sitting - Balance Support: During functional activity ?Dynamic Sitting - Level of Assistance: 5: Stand by assistance ?Static Standing Balance ?Static Standing - Balance Support: Bilateral upper extremity supported ?Static Standing - Level of Assistance: 5: Stand by assistance ?Dynamic Standing Balance ?Dynamic Standing - Balance Support: Bilateral upper extremity supported ?Dynamic Standing - Level of Assistance: 5: Stand by assistance ?Dynamic Standing - Balance Activities: Diona Foley toss ?Dynamic Standing - Comments: mild posteiror lean noted but pt was able to correct ?Exercises: ?  ?Other Treatments:    ? ? ? ?Therapy/Group: Individual Therapy ? ?Gregory [name] ?04/13/2021, 3:57 PM  ?

## 2021-04-13 NOTE — Progress Notes (Signed)
Nutrition Follow-up ? ?DOCUMENTATION CODES:  ? ?Not applicable ? ?INTERVENTION:  ?Continue bolus feeds via PEG using Nepro formula at goal volume of 315 ml (10.5 ounces) given TID. (Total of 4 cartons/day) ?  ?Free water flushes of 300 ml every 6 hours per tube.  ?  ?Tube feeding regimen to provide 1701 kcal, 77 grams of protein, and 1888 ml total free water.  ? ?NUTRITION DIAGNOSIS:  ? ?Severe Malnutrition related to chronic illness (chronic illness, dysphagia) as evidenced by severe fat depletion, severe muscle depletion. ? ?GOAL:  ? ?Patient will meet greater than or equal to 90% of their needs; met with TF ? ?MONITOR:  ? ?TF tolerance, Weight trends, Labs, I & O's, Skin ? ?REASON FOR ASSESSMENT:  ? ?Consult ?Enteral/tube feeding initiation and management ? ?ASSESSMENT:  ? ?88 year old male with history of a flutter s/p ablation W-PPM, HTN, neuropathy, HOH, dysphagia with weight loss status post G-tube placement at [facility] 02/10/2021 and who was recently admitted with acute GI bleed on 03/02/2021.Patient was noted to have functional decline and was admitted to CIR on 03/10/21. Pt found to be COVID positive on 2/16 and transferred to acute care for treatment. Pt with confusion and hallucinations due to delirium. Pt deconditioned and CIR was consulted to resume his rehab course. Pt readmitted to CIR. ? ?Pt reports no abdominal discomfort during time of visit. Pt has been tolerating his tube feeding regimen well with no difficulties. Recommend continuation of current orders. Family at bedside with no question for RD at time of visit.  ? ?NUTRITION - FOCUSED PHYSICAL EXAM: ? ?Flowsheet Row Most Recent Value  ?Orbital Region Severe depletion  ?Upper Arm Region Severe depletion  ?Thoracic and Lumbar Region Unable to assess  ?Buccal Region Severe depletion  ?Temple Region Severe depletion  ?Clavicle Bone Region Severe depletion  ?Clavicle and Acromion Bone Region Severe depletion  ?Scapular Bone Region Unable to assess   ?Dorsal Hand Severe depletion  ?Patellar Region Moderate depletion  ?Anterior Thigh Region Severe depletion  ?Posterior Calf Region Severe depletion  ?Edema (RD Assessment) Mild  ?Hair Reviewed  ?Eyes Reviewed  ?Mouth Reviewed  ?Skin Reviewed  ?Nails Reviewed  ? ?  ? ?Labs and mediations reviewed.   ? ?Diet Order:   ?Diet Order   ? ?       ?  Diet NPO time specified Except for: Ice Chips  Diet effective now       ?  ? ?  ?  ? ?  ? ? ?EDUCATION NEEDS:  ? ?Not appropriate for education at this time ? ?Skin:  Skin Assessment: Reviewed RN Assessment ?DTI: heels ?Unstageable: L foot toe, R foot toe ? ?Last BM:  2/27 ? ?Height:  ? ?Ht Readings from Last 1 Encounters:  ?04/05/21 _0  (1.727 m)  ? ? ?Weight:  ? ?Wt Readings from Last 1 Encounters:  ?04/12/21 79.5 kg  ? ?BMI:  Body mass index is 26.65 kg/m?. ? ?Estimated Nutritional Needs:  ? ?Kcal:  1650-1850 ? ?Protein:  75-90 grams ? ?Fluid:  >/= 1.6 L/day ? ?Corrin Parker, MS, RD, LDN ?RD pager number/after hours weekend pager number on Amion. ? ?

## 2021-04-13 NOTE — Progress Notes (Signed)
Occupational Therapy Session Note ? ?Patient Details  ?Name: Gregory [name] ?MRN: [id number] ?Date of Birth: 05/24/1920 ? ?Today's Date: 04/13/2021 ?OT Individual Time: 0930-1040 ?OT Individual Time Calculation (min): 70 min  ? ? ?Short Term Goals: ?Week 1:  OT Short Term Goal 1 (Week 1): Pt will perform BSC/toilet transfer with LRAD and Min A ?OT Short Term Goal 2 (Week 1): Pt will complete UB dress with Mod A ?OT Short Term Goal 3 (Week 1): Pt will complete 1/3 components of donning pants using AE or adaptive techniques as needed ?Week 2:    ? ?Skilled Therapeutic Interventions/Progress Updates:  ?   ? ?Therapy Documentation ?Precautions:  ?Precautions ?Precautions: Fall ?Precaution Comments: NPO with PEG tube, posterior lean ?Restrictions ?Weight Bearing Restrictions: No ? ?Pain: ? Pt denies pain this morning. Pt sleeping upon arrival but easily aroused. Supine>sit with supervision. Initial sit<>stand from EOB with CGA fading to supervision with repetition. Pt amb with RW to sink and sat in w/c to complete grooming tasks and UB dressing tasks with supervision. Pt transitioned to gym. Pt completed standing activities at table with supervision. Pt continues to fatigue quickly and requires multiple rest breaks. Pt pleased to be going home tomorrow. Pt returned to room and remained in w/c with son present. All needs within reach. ? ?Therapy/Group: Individual Therapy ? ?Leroy Libman ?04/13/2021, 10:49 AM ?

## 2021-04-13 NOTE — Progress Notes (Signed)
Occupational Therapy Session Note ? ?Patient Details  ?Name: Robertson Colclough ?MRN: [id number] ?Date of Birth: June 09, 1920 ? ?Today's Date: 04/13/2021 ?OT Individual Time: 9432-7614 ?OT Individual Time Calculation (min): 45 min  ? ? ?Short Term Goals: ?Week 1:  OT Short Term Goal 1 (Week 1): Pt will perform BSC/toilet transfer with LRAD and Min A ?OT Short Term Goal 2 (Week 1): Pt will complete UB dress with Mod A ?OT Short Term Goal 3 (Week 1): Pt will complete 1/3 components of donning pants using AE or adaptive techniques as needed ? ?Skilled Therapeutic Interventions/Progress Updates:  ?  Pt resting in w/c upon arrival. Pt reports he needs to urinate using urinal. Sit<>stand from w/c with supervision. Pt required assistance doffing incontinence brief, which was soiled. Pt maintained standing balance while therapist positioned urinal. Pt performed hygiene while standing. Assistance donning clean incontinence brief while maintaining standing balance. Pt transitioned to ortho gym and engaged in sit<>stand X 4 and standing balance tasks with supervision. Pt returned to room and remained in w/c with RN present.  ? ?Therapy Documentation ?Precautions:  ?Precautions ?Precautions: Fall ?Precaution Comments: NPO with PEG tube, posterior lean ?Restrictions ?Weight Bearing Restrictions: No ? ?Pain: ? Pt denies pain this afternoon ? ? ?Therapy/Group: Individual Therapy ? ?Leroy Libman ?04/13/2021, 2:25 PM ?

## 2021-04-14 LAB — GLUCOSE, CAPILLARY
Glucose-Capillary: 106 mg/dL — ABNORMAL HIGH (ref 70–99)
Glucose-Capillary: 113 mg/dL — ABNORMAL HIGH (ref 70–99)
Glucose-Capillary: 94 mg/dL (ref 70–99)

## 2021-04-14 MED ORDER — SUCRALFATE 1 GM/10ML PO SUSP: 1.0000 g | Freq: Three times a day (TID) | ORAL | 0 refills | Status: DC

## 2021-04-14 MED ORDER — MELATONIN 5 MG PO TABS: 5.0000 mg | ORAL_TABLET | Freq: Every evening | ORAL | 0 refills | Status: DC | PRN

## 2021-04-14 MED ORDER — FERROUS SULFATE 220 (44 FE) MG/5ML PO ELIX: 220.0000 mg | ORAL_SOLUTION | Freq: Two times a day (BID) | ORAL | 0 refills | Status: DC

## 2021-04-14 MED ORDER — K PHOS MONO-SOD PHOS DI & MONO 155-852-130 MG PO TABS: 250.0000 mg | ORAL_TABLET | Freq: Every day | ORAL | 0 refills | Status: DC

## 2021-04-14 MED ORDER — QUETIAPINE FUMARATE 25 MG PO TABS: 12.5000 mg | ORAL_TABLET | Freq: Every day | ORAL | 0 refills | Status: DC

## 2021-04-14 MED ORDER — PANTOPRAZOLE SODIUM 40 MG PO PACK: 40.0000 mg | PACK | Freq: Two times a day (BID) | ORAL | 0 refills | Status: DC

## 2021-04-14 MED ORDER — SENNOSIDES-DOCUSATE SODIUM 8.6-50 MG PO TABS: 2.0000 | ORAL_TABLET | Freq: Every day | ORAL | 0 refills | Status: DC

## 2021-04-14 NOTE — Progress Notes (Signed)
Patient discharged to home at 11:00am in personal vehicle with all personal belongings. Family declined taking patient binder. Discharge summary reviewed with PA. Patient family requested further nutrition information. Dietitian met with patient family prior to discharge. This nurse confirmed all questions had been answered. Family denies further questions or concerns.  ?

## 2021-04-14 NOTE — Progress Notes (Signed)
Inpatient Rehabilitation Care Coordinator ?Discharge Note  ? ?Patient Details  ?Name: Gregory [name] ?MRN: [id number] ?Date of Birth: [birth year] ? ? ?Discharge location: D/c to home with support from son and dtr. ? ?Length of Stay: 8 days ? ?Discharge activity level: Contact guard to Supervision ? ?Home/community participation: Limited ? ?Patient response OT:LXBWIO Literacy - How often do you need to have someone help you when you read instructions, pamphlets, or other written material from your doctor or pharmacy?: Sometimes ? ?Patient response MB:TDHRCB Isolation - How often do you feel lonely or isolated from those around you?: Never ? ?Services provided included: MD, RD, PT, OT, SLP, RN, CM, TR, Neuropsych, Pharmacy, SW ? ?Financial Services:  ?Charity fundraiser Utilized: Medicare ?  ? ?Choices offered to/list presented to: Yes ? ?Follow-up services arranged:  ?Home Health, DME ?Home Health Agency: [facility] HHPT/OT/SLP/RN/aide  ?  ?DME : [place] for portable sux; hospital bed arranged while on acute. ?  ? ?Patient response to transportation need: ?Is the patient able to respond to transportation needs?: Yes ?In the past 12 months, has lack of transportation kept you from medical appointments or from getting medications?: No ?In the past 12 months, has lack of transportation kept you from meetings, work, or from getting things needed for daily living?: No ? ?Comments (or additional information): ? ?Patient/Family verbalized understanding of follow-up arrangements:  Yes ? ?Individual responsible for coordination of the follow-up plan: contact pt dtr [name] ? ?Confirmed correct DME delivered: Rana Snare 04/14/2021   ? ?Rana Snare ?

## 2021-04-14 NOTE — Progress Notes (Signed)
Nutrition Brief Note ? ?RD consulted as family with questions regarding tube feeding regimen for discharge. Plans for pt discharge home today.  ? ?Recommended tube feeding regimen via PEG for discharge home (handout given to daughter): ?9:00 am: Free water flush of 300 ml  ?10:00 am: Feeding of 315 ml (10.5 oz) Nepro formula  ?12:00 pm: Free water flush of 300 ml  ?2:00 pm: Feeding of 315 ml (10.5 oz) Nepro formula  ?4:00pm: Free water flush of 300 ml  ?7:00 pm: Feeding of 315 ml (10.5 oz) Nepro formula ?9:00 pm: Free water flush of 300 ml  ?Flush tube with 30 ml (1 oz) water before and after bolus formula feedings. ? ?Daughter reports she is unwilling to provide the free water and formula feeds separate throughout the day and want to combine the free water of 300 ml and formula of 315 ml together and give it TID. RD questions pt's ability to tolerate a total of ~615 ml volume at one time, however daughter reports he will be able to tolerate it. Daughter also reports unwillingness to provide an additional free water flush of 30 ml before and after feeds for tube maintenance as she reports "that is what the 300 ml free water flush is for". Daughter reports no further questions related to tube feeding regimen for home. Noted, daughter reports she has not been educated on pt's tube feeding regimen since admission, however RD has educated daughter regarding pt tube feeds at 3 different visits.  ? ?Corrin Parker, MS, RD, LDN ?RD pager number/after hours weekend pager number on Amion. ? ? ?

## 2021-04-14 NOTE — Progress Notes (Signed)
PROGRESS NOTE   Subjective/Complaints:  Pt reports need to pee- waiting for NT- who came when I left.  Happy to have me as a doctor and ready to go home today.    HXT:AVWPVXY due to [facility]- sleepy Objective:   No results found. No results for input(s): WBC, HGB, HCT, PLT in the last 72 hours.  No results for input(s): NA, K, CL, CO2, GLUCOSE, BUN, CREATININE, CALCIUM in the last 72 hours.   Intake/Output Summary (Last 24 hours) at 04/14/2021 0747 Last data filed at 04/13/2021 2331 Gross per 24 hour  Intake --  Output 825 ml  Net -825 ml      Pressure Injury 03/03/21 Heel Left Deep Tissue Pressure Injury - Purple or maroon localized area of discolored intact skin or blood-filled blister due to damage of underlying soft tissue from pressure and/or shear. (Active)  03/03/21 1114  Location: Heel  Location Orientation: Left  Staging: Deep Tissue Pressure Injury - Purple or maroon localized area of discolored intact skin or blood-filled blister due to damage of underlying soft tissue from pressure and/or shear.  Wound Description (Comments):   Present on Admission: Yes     Pressure Injury 03/10/21 Toe (Comment  which one) Anterior;Left;Distal Unstageable - Full thickness tissue loss in which the base of the injury is covered by slough (yellow, tan, gray, green or brown) and/or eschar (tan, brown or black) in the wound bed. (Active)  03/10/21 1730  Location: Toe (Comment  which one) (Left great toe at the tip.)  Location Orientation: Anterior;Left;Distal  Staging: Unstageable - Full thickness tissue loss in which the base of the injury is covered by slough (yellow, tan, gray, green or brown) and/or eschar (tan, brown or black) in the wound bed.  Wound Description (Comments): Black area at the tip of the left great toe.  Present on Admission: Yes     Pressure Injury 03/10/21 Toe (Comment  which one) Anterior;Right;Distal Unstageable  - Full thickness tissue loss in which the base of the injury is covered by slough (yellow, tan, gray, green or brown) and/or eschar (tan, brown or black) in the wound bed (Active)  03/10/21 1730  Location: Toe (Comment  which one) (Right great toe at the tip of the toe.)  Location Orientation: Anterior;Right;Distal  Staging: Unstageable - Full thickness tissue loss in which the base of the injury is covered by slough (yellow, tan, gray, green or brown) and/or eschar (tan, brown or black) in the wound bed.  Wound Description (Comments): Right great toe at the tip of the toe.  Present on Admission: Yes     Pressure Injury 04/03/21 Heel Right Deep Tissue Pressure Injury - Purple or maroon localized area of discolored intact skin or blood-filled blister due to damage of underlying soft tissue from pressure and/or shear. (Active)  04/03/21 2000  Location: Heel  Location Orientation: Right  Staging: Deep Tissue Pressure Injury - Purple or maroon localized area of discolored intact skin or blood-filled blister due to damage of underlying soft tissue from pressure and/or shear.  Wound Description (Comments):   Present on Admission:     Physical Exam: Vital Signs Blood pressure (!) 131/57, pulse 71, temperature  98.7 F (37.1 C), temperature source Oral, resp. rate 17, height 5\' 8"  (1.727 m), weight 79.7 kg, SpO2 92 %.        General: awake, alert, appropriate, laying supine in bed; NAD HENT: conjugate gaze; oropharynx moist CV: regular rate; no JVD Pulmonary: CTA B/L; no W/R/R- good air movement GI: soft, NT, ND, (+)BS Psychiatric: appropriate Neurological: HOH- sleepy, but alert Skin: Comments: Bilateral great toes with dark eschar.   L temple- dried blood/scab  Assessment/Plan: 1. Functional deficits which require 3+ hours per day of interdisciplinary therapy in a comprehensive inpatient rehab setting. Physiatrist is providing close team supervision and 24 hour management of active  medical problems listed below. Physiatrist and rehab team continue to assess barriers to discharge/monitor patient progress toward functional and medical goals  Care Tool:  Bathing  Bathing activity did not occur: Safety/medical concerns Body parts bathed by patient: Right arm, Left arm, Chest, Abdomen, Front perineal area, Buttocks, Right upper leg, Left upper leg, Face   Body parts bathed by helper: Right lower leg, Left lower leg     Bathing assist Assist Level: Minimal Assistance - Patient > 75%     Upper Body Dressing/Undressing Upper body dressing   What is the patient wearing?: Pull over shirt    Upper body assist Assist Level: Supervision/Verbal cueing    Lower Body Dressing/Undressing Lower body dressing      What is the patient wearing?: Pants     Lower body assist Assist for lower body dressing: Minimal Assistance - Patient > 75%     Toileting Toileting    Toileting assist Assist for toileting: Minimal Assistance - Patient > 75%     Transfers Chair/bed transfer  Transfers assist     Chair/bed transfer assist level: Contact Guard/Touching assist     Locomotion Ambulation   Ambulation assist   Ambulation activity did not occur: Safety/medical concerns  Assist level: Contact Guard/Touching assist Assistive device: Walker-rolling Max distance: 167ft   Walk 10 feet activity   Assist  Walk 10 feet activity did not occur: Safety/medical concerns  Assist level: Contact Guard/Touching assist Assistive device: Walker-rolling   Walk 50 feet activity   Assist Walk 50 feet with 2 turns activity did not occur: Safety/medical concerns  Assist level: Contact Guard/Touching assist Assistive device: Walker-rolling    Walk 150 feet activity   Assist Walk 150 feet activity did not occur: Safety/medical concerns  Assist level: Contact Guard/Touching assist Assistive device: Walker-rolling    Walk 10 feet on uneven surface   activity   Assist Walk 10 feet on uneven surfaces activity did not occur: Safety/medical concerns   Assist level: 2 helpers Assistive device: Aeronautical engineer Is the patient using a wheelchair?: No Type of Wheelchair: Manual           Wheelchair 50 feet with 2 turns activity    Assist            Wheelchair 150 feet activity     Assist          Blood pressure (!) 131/57, pulse 71, temperature 98.7 F (37.1 C), temperature source Oral, resp. rate 17, height 5\' 8"  (1.727 m), weight 79.7 kg, SpO2 92 %.  Medical Problem List and Plan: 1. Functional deficits secondary to debility s/p COVID with delirium  D/c today- can have f/u with me if need be, but doesn't need to 2.  Antithrombotics: -DVT/anticoagulation:  Pharmaceutical: Lovenox             -  antiplatelet therapy: N/A 3. Pain Management: Tylenol prn.  4. Mood: LCSW to follow for evaluation and support.              -antipsychotic agents: N/A 5. Neuropsych: This patient is not capable of making decisions on his own behalf.   3/2- more appropriate- more alert 6. Skin/Wound Care: Routine pressure relief measures             --air mattress. Prevalon boots and foam dressing on ankles.  7. Fluids/Electrolytes/Nutrition:  Monitor renal status with routine checks             --pre-renal azotemia has improved with increase in water flushes  2/24- BUN down slightly to 33- con't regimen with water flushes and TF's.   2/27- BUN down to 23 and Cr 1.12- so back to baseline- doing better with current water flushes- con't regimen  3/3- will need PCP to f/u every so often- doing better 8. Covid 19: Treated with Remdesvir and steroids. 3/2- resolved  9. Delirium:              --also exacerbated by steroids and recent infection.              --will order sleep wake chart. Consistent routine.  --Monitor for hospital psychosis with prolonged LOS.  Add seroquel HS. 2/23- required mittens last  night since pulling at lines and PEG- will get abd binder and con't mittens as required/restraints required to prevent damage to pt by himself. Will verify has sleep wake chart done.   2/24- per nursing ,slept better- out of mittens- will monitor closely and use restraints if required to protect pt/PEG. ?  Improving on 2/25 2/27- not requiring mittens anymore- doing better- con't regimen 2/28- Not requiring restraints anymore- appears improved/resolved 3/1- no issues  3/2- change seroquel 12.5 mg to QHS prn/as needed for agitation  3/3- seroquel wasn't needed last night- can send home on a few just in case.  10.  Sleep disturbance: Will order sleep chart.  --Had trazodone and Seroquel ineffective  --Added melatonin as sedated this am.  11. Dysphagia: Continue n.p.o. with ice chips after oral care.             --Nephro TID with water flushes qid.  12. GIB: H/H stable. Continue Iron supplement.             --on Carafate and protonix  Hemoglobin 8.3 on 2/24, continue to monitor  2/28- Hb 8.6- con't reigmen/monoitoring 13. CAF/CHB  s/p PPM: Monitor HR TID.  14. Constipation: Continue Senna S at bedtime.  15. Acute on chronic renal failure: Improved and likely at baseline.   Creatinine 1.19 on 2/23, continue to monitor  2/27- Cr 1.12 and BUN down to 23- con't regimen 16. Tremors: d/c trazodone since ineffective anyway.   17. Hypokalemia  Potassium 3.3 on 2/23, additional supplement ordered, labs ordered for Monday  2/27- K+ 3.6- doing better 18. Hypoglycemia  3/1- was 69 last night- asymptomatic- on no meds or SSI- being checked due to TF's- con't checking- first time dropped.   3/2- CBGs look good- well controlled on TF's.       LOS: 9 days A FACE TO FACE EVALUATION WAS PERFORMED  Shahil Speegle 04/14/2021, 7:47 AM

## 2021-04-27 ENCOUNTER — Encounter (HOSPITAL_COMMUNITY): Payer: Medicare Other

## 2021-04-27 ENCOUNTER — Ambulatory Visit (HOSPITAL_COMMUNITY)
Admission: RE | Admit: 2021-04-27 | Discharge: 2021-04-27 | Disposition: A | Discharge status: Home / Self Care | Payer: Medicare Other | Source: Ambulatory Visit | Attending: Vascular Surgery | Admitting: Vascular Surgery

## 2021-04-27 ENCOUNTER — Other Ambulatory Visit: Payer: Self-pay

## 2021-04-27 ENCOUNTER — Encounter: Payer: Self-pay | Admitting: Vascular Surgery

## 2021-04-27 ENCOUNTER — Ambulatory Visit (INDEPENDENT_AMBULATORY_CARE_PROVIDER_SITE_OTHER): Payer: Medicare Other | Admitting: Vascular Surgery

## 2021-04-27 VITALS — BP 125/78 | HR 79 | Temp 98.4°F | Resp 20 | Ht 68.0 in | Wt 175.0 lb

## 2021-04-27 DIAGNOSIS — I70263 Atherosclerosis of native arteries of extremities with gangrene, bilateral legs: Secondary | ICD-10-CM

## 2021-04-27 DIAGNOSIS — S91309D Unspecified open wound, unspecified foot, subsequent encounter: Secondary | ICD-10-CM

## 2021-04-27 NOTE — Progress Notes (Signed)
ARTERIAL DOPPLER STUDY:  ASSESSMENT & PLAN   CRITICAL LIMB ISCHEMIA: This patient has dry gangrene on the tips of both great toes in addition to a pressure sore on the left heel.  This is clearly a limb threatening situation.  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  Thus I think ultimately he will require an arteriogram to see if he has any endovascular options for revascularization.  He is clearly not a candidate for a bypass.  However, this patient is at extremely high risk for any intervention given his age, severe protein calorie malnutrition, and markedly debilitated state.  He had complications after placement of his PEG in December.  He then had complications during his hospitalization in January including COVID and a UTI.  For this reason I am reluctant to rush into arteriography until I am sure that he is making some progress clinically.  His most recent hemoglobin was 8.6.  I plan on seeing him back in 2 weeks.  If he is improving clinically I think we could consider arteriography and possible intervention although again this would be associated with significant risk.  I explained that likely without revascularization he would require bilateral amputations.  We have discussed arteriography and the potential risks involved including though 1 to 2% risk of arterial injury or bleeding, kidney failure, or other unpredictable medical problems.  I will see him back in 2 weeks and we can reconsider arteriography.  The patient is agreeable with this plan and the daughter also understands and is agreeable with this plan.  Currently he is getting nutrition through his feeding tube and that is certainly helpful.  In addition he is not a smoker.  REASON FOR CONSULT:    Peripheral arterial disease with wounds on both feet.  The consult is requested by Dr. Lavell Islam.  HPI:   Gregory [name] is a 88 y.o.  male who was referred with nonhealing wounds of his feet and peripheral arterial disease.  I have reviewed the records that were sent from [facility].  The history is for the most part obtained from his daughter.  The patient has been followed at the [facility] in [facility] for over 30 years.  He was down there to have a PEG tube as he has significant dysphagia and severe protein calorie malnutrition.  However he had some complications down there including bleeding from his esophagus.  He subsequently returned to [place].  He was admitted at [facility] on 04/[facility]21 with a GI bleed requiring 2 units of blood.  He had his G-tube placed in December 2022.  He declined functionally and ultimately required a short stay in rehab for intensive physical therapy and Occupational Therapy.  He had been complaining of his feet being cold when he was at [facility] according to the daughter.  They were putting blankets on his feet and he developed pressure sores on the tips of his great toes.  He also has developed a pressure sore in his left heel.  He was sent for vascular consultation.  On my history the patient had been somewhat ambulatory with a walker but I think his activity was very limited.  I do not get any clear-cut history of claudication or rest pain.  He denies any previous ulcers in his feet.  Of note his hospitalization at [facility] was complicated by COVID and a UTI.  He had been on chronic anticoagulation for A-fib but this  was stopped after his GI bleed recently.  He has undergone a previous ablation.  Past Medical History:  Diagnosis Date   Atrial flutter (HCC)    s/p ablation   Cancer (HCC)    skin cancer   Cataracts, bilateral    Hypertension    Neuromuscular disorder (HCC)    Pacemaker 2010   Thyroid disease     Family History  Problem Relation Age of Onset   Heart disease Mother    Cancer Father        leukemia    SOCIAL HISTORY: Social History   Tobacco Use   Smoking status:  Never   Smokeless tobacco: Never  Substance Use Topics   Alcohol use: No    Allergies  Allergen Reactions   Cefepime Other (See Comments)    Severe myoclonus for cefepime- don't take again-    Amlodipine Besylate     Other reaction(s): edema   Penicillins Hives and Rash    Current Outpatient Medications  Medication Sig Dispense Refill   acetaminophen (TYLENOL) 325 MG tablet Place 1-2 tablets (325-650 mg total) into feeding tube every 4 (four) hours as needed for mild pain.     alum & mag hydroxide-simeth (MAALOX/MYLANTA) 200-200-20 MG/5ML suspension Place 30 mLs into feeding tube every 4 (four) hours as needed for indigestion. 355 mL 0   atorvastatin (LIPITOR) 40 MG tablet Place 1 tablet (40 mg total) into feeding tube at bedtime. 30 tablet 0   bisacodyl (DULCOLAX) 10 MG suppository Place 1 suppository (10 mg total) rectally daily as needed for moderate constipation. 12 suppository 0   chlorhexidine (PERIDEX) 0.12 % solution 15 mLs by Mouth Rinse route 2 (two) times daily. 120 mL 0   ferrous sulfate 220 (44 Fe) MG/5ML solution Place 5 mLs (220 mg total) into feeding tube 2 (two) times daily with a meal. 300 mL 0   guaiFENesin-dextromethorphan (ROBITUSSIN DM) 100-10 MG/5ML syrup Place 5-10 mLs into feeding tube every 6 (six) hours as needed for cough. 118 mL 0   levothyroxine (SYNTHROID) 75 MCG tablet Place 1 tablet (75 mcg total) into feeding tube daily at 6 (six) AM. 30 tablet 0   lip balm (CARMEX) ointment Apply topically as needed for lip care. 7 g 0   melatonin 5 MG TABS Place 1 tablet (5 mg total) into feeding tube at bedtime as needed.  0   Mouthwashes (MOUTH RINSE) LIQD solution 15 mLs by Mouth Rinse route 4 (four) times daily. 946 mL 0   Nutritional Supplements (FEEDING SUPPLEMENT, NEPRO CARB STEADY,) LIQD Place 315 mLs into feeding tube 3 (three) times daily. 30 mL 0   pantoprazole sodium (PROTONIX) 40 mg Place 40 mg into feeding tube 2 (two) times daily before a meal. 60 packet  0   phosphorus (K PHOS NEUTRAL) 155-852-130 MG tablet Place 1 tablet (250 mg total) into feeding tube daily. 30 tablet 0   polyethylene glycol (MIRALAX / GLYCOLAX) 17 g packet Place 17 g into feeding tube daily as needed for mild constipation. 14 each 0   QUEtiapine (SEROQUEL) 25 MG tablet Place 0.5 tablets (12.5 mg total) into feeding tube at bedtime. 30 tablet 0   senna-docusate (SENOKOT-S) 8.6-50 MG tablet Place 2 tablets into feeding tube at bedtime. 60 tablet 0   sucralfate (CARAFATE) 1 GM/10ML suspension Place 10 mLs (1 g total) into feeding tube every 8 (eight) hours. 900 mL 0   Water For Irrigation, Sterile (FREE WATER) SOLN Place 300 mLs into feeding tube 4 (  four) times daily. 300 mL 0   No current facility-administered medications for this visit.    REVIEW OF SYSTEMS:  [X]  denotes positive finding, [ ]  denotes negative finding Cardiac  Comments:  Chest pain or chest pressure:    Shortness of breath upon exertion: x   Short of breath when lying flat:    Irregular heart rhythm:        Vascular    Pain in calf, thigh, or hip brought on by ambulation:    Pain in feet at night that wakes you up from your sleep:     Blood clot in your veins:    Leg swelling:         Pulmonary    Oxygen at home:    Productive cough:     Wheezing:         Neurologic    Sudden weakness in arms or legs:     Sudden numbness in arms or legs:     Sudden onset of difficulty speaking or slurred speech:    Temporary loss of vision in one eye:     Problems with dizziness:         Gastrointestinal    Blood in stool:     Vomited blood:         Genitourinary    Burning when urinating:     Blood in urine:        Psychiatric    Major depression:         Hematologic    Bleeding problems:    Problems with blood clotting too easily:        Skin    Rashes or ulcers: x       Constitutional    Fever or chills:    -  PHYSICAL EXAM:   Vitals:   04/27/21 1452  BP: 125/78  Pulse: 79  Resp:  20  Temp: 98.4 F (36.9 C)  SpO2: 96%  Weight: 175 lb (79.4 kg)  Height: 5\' 8"  (1.727 m)   Body mass index is 26.61 kg/m.  GENERAL: The patient is a well-nourished male, in no acute distress. The vital signs are documented above. CARDIAC: There is a regular rate and rhythm.  VASCULAR: I do not detect carotid bruits. He does have palpable femoral pulses. I cannot palpate pedal pulses. PULMONARY: There is good air exchange bilaterally without wheezing or rales. ABDOMEN: Soft and non-tender with normal pitched bowel sounds.  MUSCULOSKELETAL: There are no major deformities. NEUROLOGIC: No focal weakness or paresthesias are detected. SKIN: He has dry gangrene of the tips of his great toes. He has a pressure sore on his left heel.       PSYCHIATRIC: The patient has a normal affect.  DATA:    ARTERIAL DOPPLER STUDY: I have independently interpreted his arterial Doppler study today.  On the right side there is a monophasic dorsalis pedis and posterior tibial signal.  The arteries are not compressible as they are significantly calcified.  Toe pressure is 24 mmHg.  On the left side there is a monophasic dorsalis pedis and posterior tibial signal.  The arteries are not compressible as they are significantly calcified.  Toe pressure is 0.  LABS: I have reviewed his most recent labs.  On 04/14/2021 his creatinine was 1.12.  His GFR was 59.  Hemoglobin 8.6.  Platelets 161,000.  White blood cell count 7.3.    Waverly [facility]

## 2021-04-28 ENCOUNTER — Telehealth: Payer: Self-pay

## 2021-04-28 MED ORDER — CLOPIDOGREL BISULFATE 75 MG PO TABS: 75.0000 mg | ORAL_TABLET | Freq: Every day | ORAL | 3 refills | Status: DC

## 2021-04-28 NOTE — Telephone Encounter (Signed)
Spoke to patient's daughter [name].Dr.Jordan's advice given.Plavix prescription sent to pharmacy. ?

## 2021-04-28 NOTE — Telephone Encounter (Signed)
Received a call from patient's daughter Gregory [name].Stated she wanted to ask Dr.Jordan if ok for father to restart Eliquis.Stated even a half dose.Stated he saw Dr.Dickson yesterday,both great toes are black.He possibly has blood clots in both lower legs.He has a return appt with Dr.Dickson 3/30.Stated she wanted to make Dr.Jordan aware and ask about restarting Eliquis.Advised I will send message to him for advice. ?

## 2021-04-28 NOTE — Telephone Encounter (Signed)
I do not recommend resuming Eliquis. If anything he should be on antiplatelet therapy. Would consider starting Plavix 75 mg daily.  ? ?Sofiya Ezelle Martinique MD, [facility] ? ?

## 2021-05-05 ENCOUNTER — Other Ambulatory Visit: Payer: Self-pay

## 2021-05-05 ENCOUNTER — Encounter (HOSPITAL_BASED_OUTPATIENT_CLINIC_OR_DEPARTMENT_OTHER): Payer: Medicare Other | Attending: General Surgery | Admitting: General Surgery

## 2021-05-05 DIAGNOSIS — N1831 Chronic kidney disease, stage 3a: Secondary | ICD-10-CM | POA: Insufficient documentation

## 2021-05-05 DIAGNOSIS — R54 Age-related physical debility: Secondary | ICD-10-CM | POA: Diagnosis not present

## 2021-05-05 DIAGNOSIS — E43 Unspecified severe protein-calorie malnutrition: Secondary | ICD-10-CM | POA: Insufficient documentation

## 2021-05-05 DIAGNOSIS — L89622 Pressure ulcer of left heel, stage 2: Secondary | ICD-10-CM | POA: Diagnosis present

## 2021-05-05 DIAGNOSIS — R1310 Dysphagia, unspecified: Secondary | ICD-10-CM | POA: Insufficient documentation

## 2021-05-05 DIAGNOSIS — Z931 Gastrostomy status: Secondary | ICD-10-CM | POA: Diagnosis not present

## 2021-05-05 DIAGNOSIS — I129 Hypertensive chronic kidney disease with stage 1 through stage 4 chronic kidney disease, or unspecified chronic kidney disease: Secondary | ICD-10-CM | POA: Insufficient documentation

## 2021-05-05 DIAGNOSIS — I70263 Atherosclerosis of native arteries of extremities with gangrene, bilateral legs: Secondary | ICD-10-CM | POA: Diagnosis not present

## 2021-05-05 NOTE — Progress Notes (Signed)
STANISLAW, ACTON ([id number]) ?Visit Report for 05/05/2021 ?Abuse Risk Screen Details ?Patient Name: Date of Service: ?Gregory [name], Gregory [name] 05/05/2021 1:30 PM ?Medical Record Number: [id number] ?Patient Account Number: [id number] ?Date of Birth/Sex: Treating RN: ?07-Jul-1920 (88 y.o. Janyth Contes ?Primary Care Katia Hannen: Franki Monte [facility] Other Clinician: ?Referring Braven Wolk: ?Treating Aadin Gaut/Extender: Fredirick Maudlin ?[place], [place] ?Weeks in Treatment: 0 ?Abuse Risk Screen Items ?Answer ?ABUSE RISK SCREEN: ?Has anyone close to you tried to hurt or harm you recentlyo No ?Do you feel uncomfortable with anyone in your familyo No ?Has anyone forced you do things that you didnt want to doo No ?Electronic Signature(s) ?Signed: 05/05/2021 4:52:25 PM By: Levan Hurst RN, BSN ?Entered By: Levan Hurst on 05/05/2021 14:51:10 ?-------------------------------------------------------------------------------- ?Activities of Daily Living Details ?Patient Name: Date of Service: ?Gregory [name], Gregory [name] 05/05/2021 1:30 PM ?Medical Record Number: [id number] ?Patient Account Number: [id number] ?Date of Birth/Sex: Treating RN: ?December 12, 1920 (88 y.o. Janyth Contes ?Primary Care Keats Kingry: Franki Monte [facility] Other Clinician: ?Referring Yardley Beltran: ?Treating Calum Cormier/Extender: Fredirick Maudlin ?[place], [place] ?Weeks in Treatment: 0 ?Activities of Daily Living Items ?Answer ?Activities of Daily Living (Please select one for each item) ?Drive Automobile Not Able ?T Medications ?ake Need Assistance ?Use T elephone Need Assistance ?Care for Appearance Need Assistance ?Use T oilet Need Assistance ?Bath / Shower Need Assistance ?Dress Self Need Assistance ?Feed Self Need Assistance ?Walk Need Assistance ?Get In / Out Bed Need Assistance ?Housework Not Able ?Prepare Meals Need Assistance ?Handle Money Need Assistance ?Shop for Self Need Assistance ?Electronic Signature(s) ?Signed: 05/05/2021 4:52:25 PM By: Levan Hurst RN, BSN ?Entered By:  Levan Hurst on 05/05/2021 14:51:31 ?-------------------------------------------------------------------------------- ?Education Screening Details ?Patient Name: ?Date of Service: ?Gregory [name], Gregory [name] 05/05/2021 1:30 PM ?Medical Record Number: [id number] ?Patient Account Number: [id number] ?Date of Birth/Sex: ?Treating RN: ?September 11, 1920 (88 y.o. Janyth Contes ?Primary Care Mrk Buzby: Franki Monte [facility] ?Other Clinician: ?Referring Miamarie Moll: ?Treating Dajuan Turnley/Extender: Fredirick Maudlin ?[place], [place] ?Weeks in Treatment: 0 ?Primary Learner Assessed: Patient ?Learning Preferences/Education Level/Primary Language ?Learning Preference: Explanation, Demonstration, Printed Material ?Highest Education Level: College or Above ?Preferred Language: English ?Cognitive Barrier ?Language Barrier: No ?Translator Needed: No ?Memory Deficit: No ?Emotional Barrier: No ?Cultural/Religious Beliefs Affecting Medical Care: No ?Physical Barrier ?Impaired Vision: No ?Impaired Hearing: No ?Decreased Hand dexterity: No ?Knowledge/Comprehension ?Knowledge Level: High ?Comprehension Level: High ?Ability to understand written instructions: High ?Motivation ?Anxiety Level: Calm ?Cooperation: Cooperative ?Education Importance: Acknowledges Need ?Interest in Health Problems: Asks Questions ?Perception: Coherent ?Willingness to Engage in Self-Management High ?Activities: ?Readiness to Engage in Self-Management High ?Activities: ?Electronic Signature(s) ?Signed: 05/05/2021 4:52:25 PM By: Levan Hurst RN, BSN ?Entered By: Levan Hurst on 05/05/2021 14:51:48 ?-------------------------------------------------------------------------------- ?Fall Risk Assessment Details ?Patient Name: ?Date of Service: ?Gregory [name], Gregory [name] 05/05/2021 1:30 PM ?Medical Record Number: [id number] ?Patient Account Number: [id number] ?Date of Birth/Sex: ?Treating RN: ?Jul 07, 1920 (88 y.o. Janyth Contes ?Primary Care Verlan Grotz: Franki Monte [facility] ?Other  Clinician: ?Referring Kalyssa Anker: ?Treating Ilianna Bown/Extender: Fredirick Maudlin ?[place], [place] ?Weeks in Treatment: 0 ?Fall Risk Assessment Items ?Have you had 2 or more falls in the last 12 monthso 0 No ?Have you had any fall that resulted in injury in the last 12 monthso 0 No ?FALLS RISK SCREEN ?History of falling - immediate or within 3 months 0 No ?Secondary diagnosis (Do you have 2 or more medical diagnoseso) 15 Yes ?Ambulatory aid ?None/bed rest/wheelchair/nurse 0 Yes ?Crutches/cane/walker 0 No ?Furniture 0 No ?Intravenous therapy Access/Saline/Heparin Lock 0 No ?Gait/Transferring ?Normal/ bed rest/ wheelchair  0 No ?Weak (short steps with or without shuffle, stooped but able to lift head while walking, may seek 10 Yes ?support from furniture) ?Impaired (short steps with shuffle, may have difficulty arising from chair, head down, impaired 0 No ?balance) ?Mental Status ?Oriented to own ability 0 Yes ?Electronic Signature(s) ?Signed: 05/05/2021 4:52:25 PM By: Levan Hurst RN, BSN ?Entered By: Levan Hurst on 05/05/2021 14:51:59 ?-------------------------------------------------------------------------------- ?Foot Assessment Details ?Patient Name: ?Date of Service: ?Gregory [name], Gregory [name] 05/05/2021 1:30 PM ?Medical Record Number: [id number] ?Patient Account Number: [id number] ?Date of Birth/Sex: ?Treating RN: ?Apr 18, 1920 (88 y.o. Janyth Contes ?Primary Care Sherene Plancarte: Franki Monte [facility] ?Other Clinician: ?Referring Angeldejesus Callaham: ?Treating Rayah Fines/Extender: Fredirick Maudlin ?[place], [place] ?Weeks in Treatment: 0 ?Foot Assessment Items ?Site Locations ?+ = Sensation present, - = Sensation absent, C = Callus, U = Ulcer ?R = Redness, W = Warmth, M = Maceration, PU = Pre-ulcerative lesion ?F = Fissure, S = Swelling, D = Dryness ?Assessment ?Right: Left: ?Other Deformity: No No ?Prior Foot Ulcer: No No ?Prior Amputation: No No ?Charcot Joint: No No ?Ambulatory Status: Ambulatory With Help ?Assistance Device:  Wheelchair ?Gait: Unsteady ?Electronic Signature(s) ?Signed: 05/05/2021 4:52:25 PM By: Levan Hurst RN, BSN ?Entered By: Levan Hurst on 05/05/2021 14:52:44 ?-------------------------------------------------------------------------------- ?Nutrition Risk Screening Details ?Patient Name: ?Date of Service: ?Gregory [name], Gregory [name] 05/05/2021 1:30 PM ?Medical Record Number: [id number] ?Patient Account Number: [id number] ?Date of Birth/Sex: ?Treating RN: ?1920-07-12 (88 y.o. Janyth Contes ?Primary Care Allen Egerton: Franki Monte [facility] ?Other Clinician: ?Referring Deanda Ruddell: ?Treating Megham Dwyer/Extender: Fredirick Maudlin ?[place], [place] ?Weeks in Treatment: 0 ?Height (in): ?Weight (lbs): ?Body Mass Index (BMI): ?Nutrition Risk Screening Items ?Score Screening ?NUTRITION RISK SCREEN: ?I have an illness or condition that made me change the kind and/or amount of food I eat 2 Yes ?I eat fewer than two meals per day 0 No ?I eat few fruits and vegetables, or milk products 0 No ?I have three or more drinks of beer, liquor or wine almost every day 0 No ?I have tooth or mouth problems that make it hard for me to eat 2 Yes ?I don't always have enough money to buy the food I need 0 No ?I eat alone most of the time 0 No ?I take three or more different prescribed or over-the-counter drugs Gregory day 1 Yes ?Without wanting to, I have lost or gained 10 pounds in the last six months 0 No ?I am not always physically able to shop, cook and/or feed myself 2 Yes ?Nutrition Protocols ?Good Risk Protocol ?Moderate Risk Protocol ?High Risk Proctocol 0 Provide education on nutrition ?Risk Level: High Risk ?Score: 7 ?Electronic Signature(s) ?Signed: 05/05/2021 4:52:25 PM By: Levan Hurst RN, BSN ?Entered By: Levan Hurst on 05/05/2021 14:52:18 ?

## 2021-05-08 ENCOUNTER — Encounter: Payer: Self-pay | Admitting: Cardiology

## 2021-05-08 ENCOUNTER — Other Ambulatory Visit: Payer: Self-pay | Admitting: Physical Medicine and Rehabilitation

## 2021-05-08 NOTE — Telephone Encounter (Signed)
Given severe PAD I would encourage him to continue Plavix unless hemoptysis is ongoing. ? ?Zelpha Messing [place] MD, [facility] ? ?

## 2021-05-08 NOTE — Progress Notes (Signed)
SERGIO, ZAWISLAK ([id number]) ?Visit Report for 05/05/2021 ?Chief Complaint Document Details ?Patient Name: Date of Service: ?A NDREWS, [name] 05/05/2021 1:30 PM ?Medical Record Number: [id number] ?Patient Account Number: [id number] ?Date of Birth/Sex: Treating RN: ?11/12/1920 (88 y.o. Male) Levan Hurst ?Primary Care Provider: Angelia Mould Other Clinician: ?Referring Provider: ?Treating Provider/Extender: Fredirick Maudlin ?Deitra [name] S ?Weeks in Treatment: 0 ?Information Obtained from: Patient ?Chief Complaint ?Patient is at the clinic for treatment of an open pressure ulcer on his left heel and dry gangrene of both great toes ?Electronic Signature(s) ?Signed: 05/05/2021 4:06:00 PM By: Fredirick Maudlin MD FACS ?Entered By: Fredirick Maudlin on 05/05/2021 16:06:00 ?-------------------------------------------------------------------------------- ?Debridement Details ?Patient Name: Date of Service: ?A NDREWS, [name] 05/05/2021 1:30 PM ?Medical Record Number: [id number] ?Patient Account Number: [id number] ?Date of Birth/Sex: Treating RN: ?24-Apr-1920 (88 y.o. Male) Levan Hurst ?Primary Care Provider: Angelia Mould Other Clinician: ?Referring Provider: ?Treating Provider/Extender: Fredirick Maudlin ?Deitra [name] S ?Weeks in Treatment: 0 ?Debridement Performed for Assessment: Wound #3 Left Calcaneus ?Performed By: Physician Fredirick Maudlin, MD ?Debridement Type: Debridement ?Level of Consciousness (Pre-procedure): Awake and Alert ?Pre-procedure Verification/Time Out Yes - 15:06 ?Taken: ?Start Time: 15:06 ?Pain Control: ?Other : Benzocaine 20% ?T Area Debrided (L x W): ?otal 1.2 (cm) x 2 (cm) = 2.4 (cm?) ?Tissue and other material debrided: Non-Viable, Slough, Subcutaneous, [place] ?Level: Skin/Subcutaneous Tissue ?Debridement Description: Excisional ?Instrument: Curette ?Bleeding: Minimum ?Hemostasis Achieved: Pressure ?End Time: 15:08 ?Procedural Pain: 0 ?Post Procedural Pain: 0 ?Response  to Treatment: Procedure was tolerated well ?Level of Consciousness (Post- Awake and Alert ?procedure): ?Post Debridement Measurements of Total Wound ?Length: (cm) 1.2 ?Stage: Category/Stage III ?Width: (cm) 2 ?Depth: (cm) 0.1 ?Volume: (cm?) 0.188 ?Character of Wound/Ulcer Post Debridement: Improved ?Post Procedure Diagnosis ?Same as Pre-procedure ?Electronic Signature(s) ?Signed: 05/05/2021 4:52:25 PM By: Levan Hurst RN, BSN ?Signed: 05/08/2021 7:55:23 AM By: Fredirick Maudlin MD FACS ?Entered By: Levan Hurst on 05/05/2021 15:09:55 ?-------------------------------------------------------------------------------- ?HPI Details ?Patient Name: Date of Service: ?A NDREWS, [name] 05/05/2021 1:30 PM ?Medical Record Number: [id number] ?Patient Account Number: [id number] ?Date of Birth/Sex: Treating RN: ?Dec 03, 1920 (88 y.o. Male) Levan Hurst ?Primary Care Provider: Angelia Mould Other Clinician: ?Referring Provider: ?Treating Provider/Extender: Fredirick Maudlin ?Deitra [name] S ?Weeks in Treatment: 0 ?History of Present Illness ?HPI Description: ADMISSION ?05/05/2021 ?This is an extremely frail 88 year old man who presents to clinic today accompanied by his son for evaluation of a pressure ulcer on his left heel. He also has ?dry gangrene on the tips of his bilateral great toes and the lateral aspect of both feet. It sounds like after a hospitalization for a GI bleed, he had a blanket on ?his feet which ultimately resulted in a pressure injury. He has severe dysphagia and is dependent on G-tube feeding. The wound on his heel has been present ?since January. He has 24-hour care and they have been applying Xeroform to the heel. He apparently has some sort of air boot that he is meant to wear in bed ?at night. He has severe peripheral arterial disease as evidenced by the ABIs obtained by Dr. Rachelle Hora and copied here: ?ABI Findings: ?+---------+------------------+-----+----------+--------+ ?Right Rt  Pressure (mmHg)IndexWaveform Comment  ?+---------+------------------+-----+----------+--------+ ?Brachial 136     ?+---------+------------------+-----+----------+--------+ ?PTA >255 [place] monophasic  ?+---------+------------------+-----+----------+--------+ ?DP >255 [place] monophasic  ?+---------+------------------+-----+----------+--------+ ?Great T oe24 0.18    ?+---------+------------------+-----+----------+--------+ ?+---------+------------------+-----+----------+-------+ ?Left Lt Pressure (mmHg)IndexWaveform Comment ?+---------+------------------+-----+----------+-------+ ?Brachial 136     ?+---------+------------------+-----+----------+-------+ ?PTA >255 [place] monophasic  ?+---------+------------------+-----+----------+-------+ ?DP >255 [place] monophasic  ?+---------+------------------+-----+----------+-------+ ?Great T oe0  0.00 Absent   ?+---------+------------------+-----+----------+-------+ ?+-------+-----------+-----------+------------+------------+ ?ABI/TBIT oday's ABIT oday's TBIPrevious ABIPrevious TBI ?+-------+-----------+-----------+------------+------------+ ?Right [place] 0.18    ?+-------+-----------+-----------+------------+------------+ ?Left [place] 0.00    ?+-------+-----------+-----------+------------+------------+ ?Summary: ?Right: Resting right ankle-brachial index indicates noncompressible right ?lower extremity arteries. The right toe-brachial index is abnormal. ?Left: Resting left ankle-brachial index indicates noncompressible left ?lower extremity arteries. ?Absent left great toe PPG waveform. ?He saw Dr. Rachelle Hora on March 16 and at that time, he was felt to be far too frail to undergo any sort of intervention including an aortogram. Certainly not a ?surgical candidate. According to the electronic medical record, Dr. Rachelle Hora is planning to see him back in a couple of weeks and will determine if he has made ?any significant  improvement in his overall status and might be suitable for an intervention. I read his note to suggest that the patient has had multiple ?complications from other procedures and that even an aortogram may be too risky. ?Electronic Signature(s) ?Signed: 05/05/2021 4:13:53 PM By: Fredirick Maudlin MD FACS ?Entered By: Fredirick Maudlin on 05/05/2021 16:13:53 ?-------------------------------------------------------------------------------- ?Physical Exam Details ?Patient Name: Date of Service: ?A NDREWS, [name] 05/05/2021 1:30 PM ?Medical Record Number: [id number] ?Patient Account Number: [id number] ?Date of Birth/Sex: Treating RN: ?[birth year] (88 y.o. Male) Levan Hurst ?Primary Care Provider: Angelia Mould Other Clinician: ?Referring Provider: ?Treating Provider/Extender: Fredirick Maudlin ?Deitra [name] S ?Weeks in Treatment: 0 ?Constitutional ?. . . . No acute distress. ?Respiratory ?Normal work of breathing on room air.Marland Kitchen ?Cardiovascular ?Unable to palpate distal pulses in the lower extremities.Marland Kitchen ?Notes ?05/05/2021: Wound examon the left heel, there is a small pressure ulcer. It is flat with minimal fibrinous debris. No surrounding erythema or induration. No ?drainage or odor. On both great toes, there is dry black tissue; the same is present on both lateral feet at the level of the fifth metatarsal. No drainage or odor ?from any of the sites. ?Electronic Signature(s) ?Signed: 05/05/2021 4:16:09 PM By: Fredirick Maudlin MD FACS ?Entered By: Fredirick Maudlin on 05/05/2021 16:16:09 ?-------------------------------------------------------------------------------- ?Physician Orders Details ?Patient Name: Date of Service: ?A NDREWS, [name] 05/05/2021 1:30 PM ?Medical Record Number: [id number] ?Patient Account Number: [id number] ?Date of Birth/Sex: Treating RN: ?[birth year] (88 y.o. Male) Levan Hurst ?Primary Care Provider: Angelia Mould Other Clinician: ?Referring Provider: ?Treating  Provider/Extender: Fredirick Maudlin ?Deitra [name] S ?Weeks in Treatment: 0 ?Verbal / Phone Orders: No ?Diagnosis Coding ?ICD-10 Coding ?Code Description ?Y69.485 Pressure ulcer of left heel, stage 2 ?I62.703 Atherosclerosis of native arteries of extremities with gangrene, bilateral legs ?I73.9 [place]

## 2021-05-08 NOTE — Progress Notes (Signed)
ICKER, SWIGERT ([id number]) ?Visit Report for 05/05/2021 ?Allergy List Details ?Patient Name: Date of Service: ?Gregory [name], Gregory [name] 05/05/2021 1:30 PM ?Medical Record Number: [id number] ?Patient Account Number: [id number] ?Date of Birth/Sex: Treating RN: ?1920/06/04 (88 y.o. Male) Levan Hurst ?Primary Care Dayna Alia: Angelia Mould Other Clinician: ?Referring Jaquavion Mccannon: ?Treating Starla Deller/Extender: Fredirick Maudlin ?Deitra [name] S ?Weeks in Treatment: 0 ?Allergies ?Active Allergies ?cefepime ?Severity: Severe ?amlodipine ?Reaction: edema ?Severity: Moderate ?penicillin ?Reaction: hives and rash ?Allergy Notes ?Electronic Signature(s) ?Signed: 05/05/2021 4:52:25 PM By: Levan Hurst RN, BSN ?Entered By: Levan Hurst on 05/05/2021 14:35:02 ?-------------------------------------------------------------------------------- ?Arrival Information Details ?Patient Name: Date of Service: ?Gregory [name], Gregory [name] 05/05/2021 1:30 PM ?Medical Record Number: [id number] ?Patient Account Number: [id number] ?Date of Birth/Sex: Treating RN: ?1920/03/24 (88 y.o. Male) Levan Hurst ?Primary Care Gill Delrossi: Angelia Mould Other Clinician: ?Referring Earla Charlie: ?Treating Mayford Alberg/Extender: Fredirick Maudlin ?Deitra [name] S ?Weeks in Treatment: 0 ?Visit Information ?Patient Arrived: Wheel Chair ?Arrival Time: 14:33 ?Accompanied By: son ?Transfer Assistance: Manual ?Patient Identification Verified: Yes ?Secondary Verification Process Completed: Yes ?Patient Requires Transmission-Based Precautions: No ?Patient Has Alerts: Yes ?Patient Alerts: ?R ABI: [place] TBI: 0.18 ?L ABI: [place] TBI: 0.0 ?Electronic Signature(s) ?Signed: 05/05/2021 4:52:25 PM By: Levan Hurst RN, BSN ?Entered By: Levan Hurst on 05/05/2021 15:05:25 ?-------------------------------------------------------------------------------- ?Clinic Level of Care Assessment Details ?Patient Name: Date of Service: ?Gregory [name], Gregory [name] 05/05/2021 1:30 PM ?Medical Record  Number: [id number] ?Patient Account Number: [id number] ?Date of Birth/Sex: Treating RN: ?1920-06-20 (88 y.o. Male) Levan Hurst ?Primary Care Evangela Heffler: Angelia Mould Other Clinician: ?Referring Emileigh Kellett: ?Treating Anberlyn Feimster/Extender: Fredirick Maudlin ?Deitra [name] S ?Weeks in Treatment: 0 ?Clinic Level of Care Assessment Items ?TOOL 1 Quantity Score ?X- 1 0 ?Use when EandM and Procedure is performed on INITIAL visit ?ASSESSMENTS - Nursing Assessment / Reassessment ?X- 1 20 ?General Physical Exam (combine w/ comprehensive assessment (listed just below) when performed on new pt. evals) ?X- 1 25 ?Comprehensive Assessment (HX, ROS, Risk Assessments, Wounds Hx, etc.) ?ASSESSMENTS - Wound and Skin Assessment / Reassessment ?'[]'$  - 0 ?Dermatologic / Skin Assessment (not related to wound area) ?ASSESSMENTS - Ostomy and/or Continence Assessment and Care ?'[]'$  - 0 ?Incontinence Assessment and Management ?'[]'$  - 0 ?Ostomy Care Assessment and Management (repouching, etc.) ?PROCESS - Coordination of Care ?X - Simple Patient / Family Education for ongoing care 1 15 ?'[]'$  - 0 ?Complex (extensive) Patient / Family Education for ongoing care ?X- 1 10 ?Staff obtains Consents, Records, T Results / Process Orders ?est ?X- 1 10 ?Staff telephones HHA, Nursing Homes / Clarify orders / etc ?'[]'$  - 0 ?Routine Transfer to another Facility (non-emergent condition) ?'[]'$  - 0 ?Routine Hospital Admission (non-emergent condition) ?X- 1 15 ?New Admissions / Biomedical engineer / Ordering NPWT Apligraf, etc. ?, ?'[]'$  - 0 ?Emergency Hospital Admission (emergent condition) ?PROCESS - Special Needs ?'[]'$  - 0 ?Pediatric / Minor Patient Management ?'[]'$  - 0 ?Isolation Patient Management ?'[]'$  - 0 ?Hearing / Language / Visual special needs ?'[]'$  - 0 ?Assessment of Community assistance (transportation, D/C planning, etc.) ?'[]'$  - 0 ?Additional assistance / Altered mentation ?'[]'$  - 0 ?Support Surface(s) Assessment (bed, cushion, seat, etc.) ?INTERVENTIONS -  Miscellaneous ?'[]'$  - 0 ?External ear exam ?'[]'$  - 0 ?Patient Transfer (multiple staff / Civil Service fast streamer / Similar devices) ?'[]'$  - 0 ?Simple Staple / Suture removal (25 or less) ?'[]'$  - 0 ?Complex Staple / Suture removal (26 or more) ?'[]'$  - 0 ?Hypo/Hyperglycemic Management (do not check if billed separately) ?'[]'$  - 0 ?Ankle / Brachial Index (  ABI) - do not check if billed separately ?Has the patient been seen at the hospital within the last three years: Yes ?Total Score: 95 ?Level Of Care: New/Established - Level 3 ?Electronic Signature(s) ?Signed: 05/05/2021 4:52:25 PM By: Levan Hurst RN, BSN ?Entered By: Levan Hurst on 05/05/2021 16:31:51 ?-------------------------------------------------------------------------------- ?Encounter Discharge Information Details ?Patient Name: ?Date of Service: ?Gregory [name], Gregory [name] 05/05/2021 1:30 PM ?Medical Record Number: [id number] ?Patient Account Number: [id number] ?Date of Birth/Sex: ?Treating RN: ?1920-08-03 (88 y.o. Male) Levan Hurst ?Primary Care Shena Vinluan: Angelia Mould ?Other Clinician: ?Referring Leannah Guse: ?Treating Haddie Bruhl/Extender: Fredirick Maudlin ?Deitra [name] S ?Weeks in Treatment: 0 ?Encounter Discharge Information Items Post Procedure Vitals ?Discharge Condition: Stable ?Temperature (F): 97.8 ?Ambulatory Status: Wheelchair ?Pulse (bpm): 68 ?Discharge Destination: Home ?Respiratory Rate (breaths/min): 16 ?Transportation: Private Auto ?Blood Pressure (mmHg): 121/79 ?Accompanied By: son ?Schedule Follow-up Appointment: Yes ?Clinical Summary of Care: Patient Declined ?Electronic Signature(s) ?Signed: 05/05/2021 4:52:25 PM By: Levan Hurst RN, BSN ?Entered By: Levan Hurst on 05/05/2021 16:33:25 ?-------------------------------------------------------------------------------- ?Lower Extremity Assessment Details ?Patient Name: ?Date of Service: ?Gregory [name], Gregory [name] 05/05/2021 1:30 PM ?Medical Record Number: [id number] ?Patient Account Number: [id number] ?Date of  Birth/Sex: ?Treating RN: ?08/08/1920 (88 y.o. Male) Levan Hurst ?Primary Care Otilia Kareem: Angelia Mould ?Other Clinician: ?Referring Lively Haberman: ?Treating Borna Wessinger/Extender: Fredirick Maudlin ?Deitra [name] S ?Weeks in Treatment: 0 ?Edema Assessment ?Assessed: [Left: No] [Right: No] ?E[Left: dema] [Right: :] ?Calf ?Left: Right: ?Point of Measurement: From Medial Instep 31 cm 29 cm ?Ankle ?Left: Right: ?Point of Measurement: From Medial Instep 19.5 cm 21 cm ?Vascular Assessment ?Pulses: ?Dorsalis Pedis ?Palpable: [Left:No] [Right:No] ?Electronic Signature(s) ?Signed: 05/05/2021 4:52:25 PM By: Levan Hurst RN, BSN ?Signed: 05/05/2021 4:52:25 PM By: Levan Hurst RN, BSN ?Entered By: Levan Hurst on 05/05/2021 14:58:50 ?-------------------------------------------------------------------------------- ?Multi Wound Chart Details ?Patient Name: ?Date of Service: ?Gregory [name], Gregory [name] 05/05/2021 1:30 PM ?Medical Record Number: [id number] ?Patient Account Number: [id number] ?Date of Birth/Sex: ?Treating RN: ?Apr 13, 1920 (88 y.o. Male) Levan Hurst ?Primary Care Sybol Morre: Angelia Mould ?Other Clinician: ?Referring Marshun Duva: ?Treating Graden Hoshino/Extender: Fredirick Maudlin ?Deitra [name] S ?Weeks in Treatment: 0 ?Vital Signs ?Height(in): ?Pulse(bpm): 68 ?Weight(lbs): ?Blood Pressure(mmHg): 121/79 ?Body Mass Index(BMI): ?Temperature(??F): 97.8 ?Respiratory Rate(breaths/min): 16 ?Photos: ?Right T Great ?oe Left T Great ?oe Left Calcaneus ?Wound Location: ?Pressure Injury Pressure Injury Pressure Injury ?Wounding Event: ?Arterial Insufficiency Ulcer Arterial Insufficiency Ulcer Pressure Ulcer ?Primary Etiology: ?Arrhythmia, Hypertension Arrhythmia, Hypertension Arrhythmia, Hypertension ?Comorbid History: ?02/12/2021 02/12/2021 02/12/2021 ?Date Acquired: ?0 0 0 ?Weeks of Treatment: ?Open Open Open ?Wound Status: ?No No No ?Wound Recurrence: ?3x2x0.1 2x2.5x0.1 1.2x2x0.1 ?Measurements L x W x D (cm) ?4.712 3.927  1.885 ?Gregory (cm?) : ?rea ?0.471 0.393 0.188 ?Volume (cm?) : ?0.00% 0.00% 0.00% ?% Reduction in Gregory rea: ?0.00% 0.00% 0.00% ?% Reduction in Volume: ?Unclassifiable Unclassifiable Category/Stage III ?Classification: ?No

## 2021-05-11 ENCOUNTER — Other Ambulatory Visit: Payer: Self-pay | Admitting: Physical Medicine and Rehabilitation

## 2021-05-11 ENCOUNTER — Ambulatory Visit: Payer: Medicare Other | Admitting: Vascular Surgery

## 2021-05-12 ENCOUNTER — Encounter (HOSPITAL_BASED_OUTPATIENT_CLINIC_OR_DEPARTMENT_OTHER): Payer: Medicare Other | Admitting: General Surgery

## 2021-05-12 DIAGNOSIS — L89622 Pressure ulcer of left heel, stage 2: Secondary | ICD-10-CM | POA: Diagnosis not present

## 2021-05-12 NOTE — Progress Notes (Addendum)
Gregory [name], Gregory [name] ([id number]) ?Visit Report for 05/12/2021 ?Chief Complaint Document Details ?Patient Name: Date of Service: ?Gregory NDREWS, Gregory RNO [name] 05/12/2021 1:15 PM ?Medical Record Number: [id number] ?Patient Account Number: [id number] ?Date of Birth/Sex: Treating RN: ?December 13, 1920 (88 y.o. M) ?Primary Care Provider: [facility] Other Clinician: ?Referring Provider: ?Treating Provider/Extender: Fredirick Maudlin ?[place], [place] ?Weeks in Treatment: 1 ?Information Obtained from: Patient ?Chief Complaint ?Patient is at the clinic for treatment of an open pressure ulcer on his left heel and dry gangrene of both great toes ?Electronic Signature(s) ?Signed: 05/12/2021 2:29:01 PM By: Fredirick Maudlin MD FACS ?Entered By: Fredirick Maudlin on 05/12/2021 14:29:01 ?-------------------------------------------------------------------------------- ?Debridement Details ?Patient Name: Date of Service: ?Gregory NDREWS, Gregory RNO [name] 05/12/2021 1:15 PM ?Medical Record Number: [id number] ?Patient Account Number: [id number] ?Date of Birth/Sex: Treating RN: ?18-Jan-1921 (88 y.o. Gregory [name] ?Primary Care Provider: [facility] Other Clinician: ?Referring Provider: ?Treating Provider/Extender: Fredirick Maudlin ?[place], [place] ?Weeks in Treatment: 1 ?Debridement Performed for Assessment: Wound #3 Left Calcaneus ?Performed By: Physician Fredirick Maudlin, MD ?Debridement Type: Debridement ?Level of Consciousness (Pre-procedure): Awake and Alert ?Pre-procedure Verification/Time Out Yes - 14:20 ?Taken: ?Start Time: 14:25 ?Pain Control: ?Other : benzocaine 20% ?T Area Debrided (L x W): ?otal 0.7 (cm) x 1.2 (cm) = 0.84 (cm?) ?Tissue and other material debrided: Non-Viable, [place], [place] ?Level: Non-Viable Tissue ?Debridement Description: Selective/Open Wound ?Instrument: Curette ?Bleeding: Minimum ?Hemostasis Achieved: Pressure ?Procedural Pain: 0 ?Post Procedural Pain: 0 ?Response to Treatment: Procedure was tolerated well ?Level of  Consciousness (Post- Awake and Alert ?procedure): ?Post Debridement Measurements of Total Wound ?Length: (cm) 0.7 ?Stage: Category/Stage III ?Width: (cm) 1.2 ?Depth: (cm) 0.1 ?Volume: (cm?) 0.066 ?Character of Wound/Ulcer Post Debridement: Improved ?Post Procedure Diagnosis ?Same as Pre-procedure ?Electronic Signature(s) ?Signed: 05/12/2021 3:39:29 PM By: Fredirick Maudlin MD FACS ?Signed: 05/12/2021 4:10:53 PM By: Baruch Gouty RN, BSN ?Entered By: Baruch Gouty on 05/12/2021 14:26:35 ?-------------------------------------------------------------------------------- ?HPI Details ?Patient Name: Date of Service: ?Gregory NDREWS, Gregory RNO [name] 05/12/2021 1:15 PM ?Medical Record Number: [id number] ?Patient Account Number: [id number] ?Date of Birth/Sex: Treating RN: ?[birth year] (88 y.o. M) ?Primary Care Provider: [facility] Other Clinician: ?Referring Provider: ?Treating Provider/Extender: Fredirick Maudlin ?[place], [place] ?Weeks in Treatment: 1 ?History of Present Illness ?HPI Description: ADMISSION ?05/05/2021 ?This is an extremely frail 88 year old man who presents to clinic today accompanied by his son for evaluation of Gregory pressure ulcer on his left heel. He also has ?dry gangrene on the tips of his bilateral great toes and the lateral aspect of both feet. It sounds like after Gregory hospitalization for Gregory GI bleed, he had Gregory blanket on ?his feet which ultimately resulted in Gregory pressure injury. He has severe dysphagia and is dependent on G-tube feeding. The wound on his heel has been present ?since January. He has 24-hour care and they have been applying Xeroform to the heel. He apparently has some sort of air boot that he is meant to wear in bed ?at night. He has severe peripheral arterial disease as evidenced by the ABIs obtained by Dr. Rachelle Hora and copied here: ?ABI Findings: ?+---------+------------------+-----+----------+--------+ ?Right Rt Pressure (mmHg)IndexWaveform Comment   ?+---------+------------------+-----+----------+--------+ ?Brachial 136     ?+---------+------------------+-----+----------+--------+ ?PTA >255 [place] monophasic  ?+---------+------------------+-----+----------+--------+ ?DP >255 [place] monophasic  ?+---------+------------------+-----+----------+--------+ ?Great T oe24 0.18    ?+---------+------------------+-----+----------+--------+ ?+---------+------------------+-----+----------+-------+ ?Left Lt Pressure (mmHg)IndexWaveform Comment ?+---------+------------------+-----+----------+-------+ ?Brachial 136     ?+---------+------------------+-----+----------+-------+ ?PTA >255 [place] monophasic  ?+---------+------------------+-----+----------+-------+ ?DP >255 [place] monophasic  ?+---------+------------------+-----+----------+-------+ ?Great T oe0 0.00 Absent   ?+---------+------------------+-----+----------+-------+ ?+-------+-----------+-----------+------------+------------+ ?ABI/TBIT  oday's ABIT oday's TBIPrevious ABIPrevious TBI ?+-------+-----------+-----------+------------+------------+ ?Right [place] 0.18    ?+-------+-----------+-----------+------------+------------+ ?Left  0.00    ?+-------+-----------+-----------+------------+------------+ ?Summary: ?Right: Resting right ankle-brachial index indicates noncompressible right ?lower extremity arteries. The right toe-brachial index is abnormal. ?Left: Resting left ankle-brachial index indicates noncompressible left ?lower extremity arteries. ?Absent left great toe PPG waveform. ?He saw Dr. Rachelle Hora on March 16 and at that time, he was felt to be far too frail to undergo any sort of intervention including an aortogram. Certainly not Gregory ?surgical candidate. According to the electronic medical record, Dr. Rachelle Hora is planning to see him back in Gregory couple of weeks and will determine if he has made ?any significant improvement in his overall status and might be  suitable for an intervention. I read his note to suggest that the patient has had multiple ?complications from other procedures and that even an aortogram may be too risky. ?05/12/2021: Rather remarkably, the eschar and dry gangrene on his bilateral great toes and bilateral fifth metatarsals actually looks better. Some of the eschar ?on his toes is peeling away to reveal healing tissue underneath. This is also the case with his lateral foot bilaterally. The heel is clean and superficial, with ?minimal slough. ?Electronic Signature(s) ?Signed: 05/12/2021 2:30:29 PM By: Fredirick Maudlin MD FACS ?Entered By: Fredirick Maudlin on 05/12/2021 14:30:28 ?-------------------------------------------------------------------------------- ?Physical Exam Details ?Patient Name: Date of Service: ?Gregory NDREWS, Gregory RNO [name] 05/12/2021 1:15 PM ?Medical Record Number: [id number] ?Patient Account Number: [id number] ?Date of Birth/Sex: Treating RN: ?20-Jul-1920 (88 y.o. M) ?Primary Care Provider: [facility] Other Clinician: ?Referring Provider: ?Treating Provider/Extender: Fredirick Maudlin ?[place], [place] ?Weeks in Treatment: 1 ?Constitutional ?. . . . No acute distress. ?Respiratory ?Normal work of breathing on room air. ?Notes ?05/12/2021: Wound examon both great toes and the lateral feet at the level of the fifth metatarsal, the black eschar is actually receding, revealing fairly ?decent looking tissue. The small left heel ulcer is flat, with Gregory pink base. No real granulation tissue to speak of. Minimal slough. ?Electronic Signature(s) ?Signed: 05/12/2021 2:32:49 PM By: Fredirick Maudlin MD FACS ?Entered By: Fredirick Maudlin on 05/12/2021 14:32:49 ?-------------------------------------------------------------------------------- ?Physician Orders Details ?Patient Name: Date of Service: ?Gregory NDREWS, Gregory RNO [name] 05/12/2021 1:15 PM ?Medical Record Number: [id number] ?Patient Account Number: [id number] ?Date of Birth/Sex: Treating RN: ?1920-09-26 (88 y.o.  Gregory [name] ?Primary Care Provider: [facility] Other Clinician: ?Referring Provider: ?Treating Provider/Extender: Fredirick Maudlin ?[place], [place] ?Weeks in Treatment: 1 ?Verbal / Phone Orders: No ?Diagnosis Coding ?ICD-10 Coding ?Code Description ?G62.694 Pressure ulcer of left heel, stage 2 ?W54.627 [employer]

## 2021-05-15 ENCOUNTER — Other Ambulatory Visit: Payer: Self-pay | Admitting: Physical Medicine and Rehabilitation

## 2021-05-18 NOTE — Progress Notes (Signed)
[name], [name] ([id number]) ?Visit Report for 05/12/2021 ?Arrival Information Details ?Patient Name: Date of Service: ?Gregory [name], Gregory [name] 05/12/2021 1:15 PM ?Medical Record Number: [id number] ?Patient Account Number: [id number] ?Date of Birth/Sex: Treating RN: ?[birth year] (88 y.o. M) ?Primary Care Mack Thurmon: [facility] Other Clinician: ?Referring Gaspar Fowle: ?Treating Nadalyn Deringer/Extender: Fredirick Maudlin ?[place], [place] ?Weeks in Treatment: 1 ?Visit Information History Since Last Visit ?Added or deleted any medications: No ?Patient Arrived: Wheel Chair ?Any new allergies or adverse reactions: No ?Arrival Time: 13:44 ?Had Gregory fall or experienced change in No ?Accompanied By: family ?activities of daily living that may affect ?Transfer Assistance: None ?risk of falls: ?Patient Identification Verified: Yes ?Signs or symptoms of abuse/neglect since last visito No ?Secondary Verification Process Completed: Yes ?Hospitalized since last visit: No ?Patient Requires Transmission-Based Precautions: No ?Implantable device outside of the clinic excluding No ?Patient Has Alerts: Yes ?cellular tissue based products placed in the center ?Patient Alerts: ?R ABI:  TBI: 0.18 since last visit: ?L ABI:  TBI: 0.0 Has Dressing in Place as Prescribed: Yes ?Pain Present Now: No ?Electronic Signature(s) ?Signed: 05/18/2021 4:07:00 PM By: Sandre Kitty ?Entered By: Sandre Kitty on 05/12/2021 13:44:42 ?-------------------------------------------------------------------------------- ?Encounter Discharge Information Details ?Patient Name: Date of Service: ?Gregory [name], Gregory [name] 05/12/2021 1:15 PM ?Medical Record Number: [id number] ?Patient Account Number: [id number] ?Date of Birth/Sex: Treating RN: ?19-Oct-1920 (88 y.o. M) ?Primary Care Justis Closser: [facility] Other Clinician: ?Referring Jersey Espinoza: ?Treating Norm Wray/Extender: Fredirick Maudlin ?[place], [place] ?Weeks in Treatment: 1 ?Encounter Discharge Information Items Post Procedure  Vitals ?Discharge Condition: Stable ?Temperature (F): 97.7 ?Ambulatory Status: Wheelchair ?Pulse (bpm): 77 ?Discharge Destination: Home ?Respiratory Rate (breaths/min): 17 ?Transportation: Private Auto ?Blood Pressure (mmHg): 119/43 ?Accompanied By: daughter ?Schedule Follow-up Appointment: Yes ?Clinical Summary of Care: Patient Declined ?Electronic Signature(s) ?Signed: 05/12/2021 3:03:55 PM By: Adline Peals ?Entered By: Adline Peals on 05/12/2021 14:52:05 ?-------------------------------------------------------------------------------- ?Lower Extremity Assessment Details ?Patient Name: ?Date of Service: ?Gregory [name], Gregory [name] 05/12/2021 1:15 PM ?Medical Record Number: [id number] ?Patient Account Number: [id number] ?Date of Birth/Sex: ?Treating RN: ?[birth year] (88 y.o. Ernestene Mention ?Primary Care Gavina Dildine: [facility] ?Other Clinician: ?Referring Angelee Bahr: ?Treating Recardo Linn/Extender: Fredirick Maudlin ?[place], [place] ?Weeks in Treatment: 1 ?Edema Assessment ?Assessed: [Left: No] [Right: No] ?E[Left: dema] [Right: :] ?Calf ?Left: Right: ?Point of Measurement: From Medial Instep 31 cm 29 cm ?Ankle ?Left: Right: ?Point of Measurement: From Medial Instep 19.5 cm 21 cm ?Vascular Assessment ?Pulses: ?Dorsalis Pedis ?Palpable: [Left:No] [Right:No] ?Electronic Signature(s) ?Signed: 05/12/2021 4:10:53 PM By: Baruch Gouty RN, BSN ?Entered By: Baruch Gouty on 05/12/2021 14:10:46 ?-------------------------------------------------------------------------------- ?Multi Wound Chart Details ?Patient Name: ?Date of Service: ?Gregory [name], Gregory [name] 05/12/2021 1:15 PM ?Medical Record Number: [id number] ?Patient Account Number: [id number] ?Date of Birth/Sex: ?Treating RN: ?03/29/1920 (88 y.o. M) ?Primary Care Sheli Dorin: [facility] ?Other Clinician: ?Referring Kursten Kruk: ?Treating Jamario Colina/Extender: Fredirick Maudlin ?[place], [place] ?Weeks in Treatment: 1 ?Vital Signs ?Height(in): ?Pulse(bpm):  77 ?Weight(lbs): ?Blood Pressure(mmHg): 119/43 ?Body Mass Index(BMI): ?Temperature(??F): 97.7 ?Respiratory Rate(breaths/min): 17 ?Photos: [1:Right T Great oe] [2:Left T Great oe] [3:Left Calcaneus] ?Wound Location: [1:Pressure Injury] [2:Pressure Injury] [3:Pressure Injury] ?Wounding Event: [1:Arterial Insufficiency Ulcer] [2:Arterial Insufficiency Ulcer] [3:Pressure Ulcer] ?Primary Etiology: [1:Arrhythmia, Hypertension] [2:Arrhythmia, Hypertension] [3:Arrhythmia, Hypertension] ?Comorbid History: [1:02/12/2021] [2:02/12/2021] [3:02/12/2021] ?Date Acquired: [1:1] [2:1] [3:1] ?Weeks of Treatment: [1:Open] [2:Open] [3:Open] ?Wound Status: [1:No] [2:No] [3:No] ?Wound Recurrence: [1:3x1.7x0.1] [2:2.1x2x0.1] [3:0.7x1.2x0.1] ?Measurements L x W x D (cm) [1:4.006] [2:3.299] [3:0.66] ?Gregory (cm?) : ?rea [1:0.401] [2:0.33] [3:0.066] ?Volume (cm?) : [1:15.00%] [2:16.00%] [  3:65.00%] ?% Reduction in Gregory rea: [1:14.90%] [2:16.00%] [3:64.90%] ?% Reduction in Volume: [1:Full Thickness Without Exposed] [2:Unclassifiable] [3:Category/Stage III] ?Classification: [1:Support Structures None Present] [2:None Present] [3:Medium] ?Exudate Gregory mount: [1:N/Gregory] [2:N/Gregory] [3:Serosanguineous] ?Exudate Type: [1:N/Gregory] [2:N/Gregory] [3:red, brown] ?Exudate Color: [1:Flat and Intact] [2:Flat and Intact] [3:Flat and Intact] ?Wound Margin: [1:Small (1-33%)] [2:None Present (0%)] [3:Large (67-100%)] ?Granulation Amount: [1:Red] [2:N/Gregory] [3:Pink] ?Granulation Quality: [1:Large (67-100%)] [2:Large (67-100%)] [3:None Present (0%)] ?Necrotic Amount: [1:Eschar] [2:Eschar] [3:N/Gregory] ?Necrotic Tissue: ?[1:Fat Layer (Subcutaneous Tissue): Yes Fascia: No] [3:Fat Layer (Subcutaneous Tissue): Yes] ?Exposed Structures: ?[1:Fascia: No Tendon: No Muscle: No Joint: No Bone: No None] [2:Fat Layer (Subcutaneous Tissue): No Tendon: No Muscle: No Joint: No Bone: No None] [3:Fascia: No Tendon: No Muscle: No Joint: No Bone: No Small (1-33%)] ?Treatment Notes ?Electronic Signature(s) ?Signed:  05/12/2021 2:18:22 PM By: Fredirick Maudlin MD FACS ?Entered By: Fredirick Maudlin on 05/12/2021 14:18:22 ?-------------------------------------------------------------------------------- ?Multi-Disciplinary Care Plan Details ?Patient Name: ?Date of Service: ?Gregory [name], Gregory [name] 05/12/2021 1:15 PM ?Medical Record Number: [id number] ?Patient Account Number: [id number] ?Date of Birth/Sex: ?Treating RN: ?1920-06-18 (88 y.o. Ernestene Mention ?Primary Care Jrue Yambao: [facility] ?Other Clinician: ?Referring Lily Kernen: ?Treating Catriona Dillenbeck/Extender: Fredirick Maudlin ?[place], [place] ?Weeks in Treatment: 1 ?Multidisciplinary Care Plan reviewed with physician ?Active Inactive ?Abuse / Safety / Falls / Self Care Management ?Nursing Diagnoses: ?Potential for falls ?Potential for injury related to falls ?Goals: ?Patient will not experience any injury related to falls ?Date Initiated: 05/05/2021 ?Target Resolution Date: 06/02/2021 ?Goal Status: Active ?Patient/caregiver will verbalize/demonstrate measures taken to prevent injury and/or falls ?Date Initiated: 05/05/2021 ?Target Resolution Date: 06/02/2021 ?Goal Status: Active ?Interventions: ?Assess Activities of Daily Living upon admission and as needed ?Assess fall risk on admission and as needed ?Assess: immobility, friction, shearing, incontinence upon admission and as needed ?Assess impairment of mobility on admission and as needed per policy ?Assess personal safety and home safety (as indicated) on admission and as needed ?Assess self care needs on admission and as needed ?Provide education on fall prevention ?Provide education on personal and home safety ?Notes: ?Nutrition ?Nursing Diagnoses: ?Potential for alteratiion in Nutrition/Potential for imbalanced nutrition ?Goals: ?Patient/caregiver agrees to and verbalizes understanding of need to use nutritional supplements and/or vitamins as prescribed ?Date Initiated: 05/05/2021 ?Target Resolution Date: 06/02/2021 ?Goal Status:  Active ?Interventions: ?Assess HgA1c results as ordered upon admission and as needed ?Assess patient nutrition upon admission and as needed per policy ?Provide education on elevated blood sugars and impact on wound healing

## 2021-05-19 ENCOUNTER — Encounter (HOSPITAL_COMMUNITY): Payer: Self-pay

## 2021-05-19 ENCOUNTER — Encounter (HOSPITAL_BASED_OUTPATIENT_CLINIC_OR_DEPARTMENT_OTHER): Payer: Medicare Other | Admitting: General Surgery

## 2021-05-19 ENCOUNTER — Inpatient Hospital Stay (HOSPITAL_COMMUNITY)
Admission: EM | Admit: 2021-05-19 | Discharge: 2021-05-26 | DRG: 291 | Disposition: A | Discharge status: Skilled Nursing Facility | Payer: Medicare Other | Source: Skilled Nursing Facility | Attending: Internal Medicine | Admitting: Internal Medicine

## 2021-05-19 ENCOUNTER — Emergency Department (HOSPITAL_COMMUNITY): Payer: Medicare Other

## 2021-05-19 ENCOUNTER — Other Ambulatory Visit: Payer: Self-pay

## 2021-05-19 ENCOUNTER — Inpatient Hospital Stay (HOSPITAL_COMMUNITY): Payer: Medicare Other

## 2021-05-19 DIAGNOSIS — E871 Hypo-osmolality and hyponatremia: Secondary | ICD-10-CM | POA: Diagnosis present

## 2021-05-19 DIAGNOSIS — I10 Essential (primary) hypertension: Secondary | ICD-10-CM | POA: Diagnosis present

## 2021-05-19 DIAGNOSIS — Z931 Gastrostomy status: Secondary | ICD-10-CM

## 2021-05-19 DIAGNOSIS — E46 Unspecified protein-calorie malnutrition: Secondary | ICD-10-CM | POA: Diagnosis present

## 2021-05-19 DIAGNOSIS — E039 Hypothyroidism, unspecified: Secondary | ICD-10-CM | POA: Diagnosis present

## 2021-05-19 DIAGNOSIS — R1312 Dysphagia, oropharyngeal phase: Secondary | ICD-10-CM | POA: Diagnosis not present

## 2021-05-19 DIAGNOSIS — D649 Anemia, unspecified: Secondary | ICD-10-CM | POA: Diagnosis not present

## 2021-05-19 DIAGNOSIS — I5033 Acute on chronic diastolic (congestive) heart failure: Secondary | ICD-10-CM

## 2021-05-19 DIAGNOSIS — I130 Hypertensive heart and chronic kidney disease with heart failure and stage 1 through stage 4 chronic kidney disease, or unspecified chronic kidney disease: Principal | ICD-10-CM | POA: Diagnosis present

## 2021-05-19 DIAGNOSIS — E43 Unspecified severe protein-calorie malnutrition: Secondary | ICD-10-CM | POA: Diagnosis present

## 2021-05-19 DIAGNOSIS — I5031 Acute diastolic (congestive) heart failure: Secondary | ICD-10-CM | POA: Diagnosis not present

## 2021-05-19 DIAGNOSIS — R1310 Dysphagia, unspecified: Secondary | ICD-10-CM | POA: Diagnosis present

## 2021-05-19 DIAGNOSIS — R0902 Hypoxemia: Principal | ICD-10-CM

## 2021-05-19 DIAGNOSIS — Z950 Presence of cardiac pacemaker: Secondary | ICD-10-CM

## 2021-05-19 DIAGNOSIS — I071 Rheumatic tricuspid insufficiency: Secondary | ICD-10-CM | POA: Diagnosis present

## 2021-05-19 DIAGNOSIS — Z8616 Personal history of COVID-19: Secondary | ICD-10-CM | POA: Diagnosis not present

## 2021-05-19 DIAGNOSIS — I350 Nonrheumatic aortic (valve) stenosis: Secondary | ICD-10-CM | POA: Diagnosis present

## 2021-05-19 DIAGNOSIS — E785 Hyperlipidemia, unspecified: Secondary | ICD-10-CM | POA: Diagnosis not present

## 2021-05-19 DIAGNOSIS — D638 Anemia in other chronic diseases classified elsewhere: Secondary | ICD-10-CM | POA: Diagnosis present

## 2021-05-19 DIAGNOSIS — N179 Acute kidney failure, unspecified: Secondary | ICD-10-CM | POA: Diagnosis present

## 2021-05-19 DIAGNOSIS — H9190 Unspecified hearing loss, unspecified ear: Secondary | ICD-10-CM | POA: Diagnosis not present

## 2021-05-19 DIAGNOSIS — I480 Paroxysmal atrial fibrillation: Secondary | ICD-10-CM | POA: Diagnosis present

## 2021-05-19 DIAGNOSIS — N189 Chronic kidney disease, unspecified: Secondary | ICD-10-CM | POA: Diagnosis not present

## 2021-05-19 DIAGNOSIS — Z20822 Contact with and (suspected) exposure to covid-19: Secondary | ICD-10-CM | POA: Diagnosis present

## 2021-05-19 DIAGNOSIS — Z79890 Hormone replacement therapy (postmenopausal): Secondary | ICD-10-CM

## 2021-05-19 DIAGNOSIS — J9601 Acute respiratory failure with hypoxia: Secondary | ICD-10-CM | POA: Diagnosis present

## 2021-05-19 DIAGNOSIS — I509 Heart failure, unspecified: Secondary | ICD-10-CM

## 2021-05-19 DIAGNOSIS — I739 Peripheral vascular disease, unspecified: Secondary | ICD-10-CM | POA: Diagnosis not present

## 2021-05-19 DIAGNOSIS — E079 Disorder of thyroid, unspecified: Secondary | ICD-10-CM | POA: Diagnosis not present

## 2021-05-19 DIAGNOSIS — Z85828 Personal history of other malignant neoplasm of skin: Secondary | ICD-10-CM

## 2021-05-19 DIAGNOSIS — I4892 Unspecified atrial flutter: Secondary | ICD-10-CM | POA: Diagnosis present

## 2021-05-19 DIAGNOSIS — Z7902 Long term (current) use of antithrombotics/antiplatelets: Secondary | ICD-10-CM

## 2021-05-19 DIAGNOSIS — Z6826 Body mass index (BMI) 26.0-26.9, adult: Secondary | ICD-10-CM

## 2021-05-19 DIAGNOSIS — I5032 Chronic diastolic (congestive) heart failure: Secondary | ICD-10-CM | POA: Diagnosis present

## 2021-05-19 DIAGNOSIS — Z79899 Other long term (current) drug therapy: Secondary | ICD-10-CM

## 2021-05-19 DIAGNOSIS — Z8249 Family history of ischemic heart disease and other diseases of the circulatory system: Secondary | ICD-10-CM

## 2021-05-19 DIAGNOSIS — I959 Hypotension, unspecified: Secondary | ICD-10-CM | POA: Diagnosis not present

## 2021-05-19 DIAGNOSIS — Z952 Presence of prosthetic heart valve: Secondary | ICD-10-CM

## 2021-05-19 DIAGNOSIS — D509 Iron deficiency anemia, unspecified: Secondary | ICD-10-CM | POA: Diagnosis present

## 2021-05-19 DIAGNOSIS — I96 Gangrene, not elsewhere classified: Secondary | ICD-10-CM | POA: Diagnosis present

## 2021-05-19 DIAGNOSIS — Z888 Allergy status to other drugs, medicaments and biological substances status: Secondary | ICD-10-CM

## 2021-05-19 DIAGNOSIS — Z880 Allergy status to penicillin: Secondary | ICD-10-CM

## 2021-05-19 DIAGNOSIS — Z9049 Acquired absence of other specified parts of digestive tract: Secondary | ICD-10-CM

## 2021-05-19 LAB — COMPREHENSIVE METABOLIC PANEL
ALT: 22 U/L (ref 0–44)
AST: 30 U/L (ref 15–41)
Albumin: 3.5 g/dL (ref 3.5–5.0)
Alkaline Phosphatase: 68 U/L (ref 38–126)
Anion gap: 5 (ref 5–15)
BUN: 48 mg/dL — ABNORMAL HIGH (ref 8–23)
CO2: 26 mmol/L (ref 22–32)
Calcium: 9.3 mg/dL (ref 8.9–10.3)
Chloride: 106 mmol/L (ref 98–111)
Creatinine, Ser: 1.37 mg/dL — ABNORMAL HIGH (ref 0.61–1.24)
GFR, Estimated: 47 mL/min — ABNORMAL LOW (ref >60)
Glucose, Bld: 122 mg/dL — ABNORMAL HIGH (ref 70–99)
Potassium: 4.7 mmol/L (ref 3.5–5.1)
Sodium: 137 mmol/L (ref 135–145)
Total Bilirubin: 0.8 mg/dL (ref 0.3–1.2)
Total Protein: 7.5 g/dL (ref 6.5–8.1)

## 2021-05-19 LAB — CBC WITH DIFFERENTIAL/PLATELET
Abs Immature Granulocytes: 0.03 10*3/uL (ref 0.00–0.07)
Basophils Absolute: 0.1 10*3/uL (ref 0.0–0.1)
Basophils Relative: 1 %
Eosinophils Absolute: 0.5 10*3/uL (ref 0.0–0.5)
Eosinophils Relative: 5 %
HCT: 25.5 % — ABNORMAL LOW (ref 39.0–52.0)
Hemoglobin: 7.9 g/dL — ABNORMAL LOW (ref 13.0–17.0)
Immature Granulocytes: 0 %
Lymphocytes Relative: 12 %
Lymphs Abs: 1.1 10*3/uL (ref 0.7–4.0)
MCH: 31.1 pg (ref 26.0–34.0)
MCHC: 31 g/dL (ref 30.0–36.0)
MCV: 100.4 fL — ABNORMAL HIGH (ref 80.0–100.0)
Monocytes Absolute: 1.1 10*3/uL — ABNORMAL HIGH (ref 0.1–1.0)
Monocytes Relative: 12 %
Neutro Abs: 6.2 10*3/uL (ref 1.7–7.7)
Neutrophils Relative %: 70 %
Platelets: 190 10*3/uL (ref 150–400)
RBC: 2.54 MIL/uL — ABNORMAL LOW (ref 4.22–5.81)
RDW: 16.7 % — ABNORMAL HIGH (ref 11.5–15.5)
WBC: 8.9 10*3/uL (ref 4.0–10.5)
nRBC: 0 % (ref 0.0–0.2)

## 2021-05-19 LAB — TROPONIN I (HIGH SENSITIVITY)
Troponin I (High Sensitivity): 24 ng/L — ABNORMAL HIGH (ref <18)
Troponin I (High Sensitivity): 24 ng/L — ABNORMAL HIGH (ref <18)

## 2021-05-19 LAB — PREPARE RBC (CROSSMATCH)

## 2021-05-19 LAB — RESP PANEL BY RT-PCR (FLU A&B, COVID) ARPGX2
Influenza A by PCR: NEGATIVE
Influenza B by PCR: NEGATIVE
SARS Coronavirus 2 by RT PCR: POSITIVE — AB

## 2021-05-19 LAB — BRAIN NATRIURETIC PEPTIDE: B Natriuretic Peptide: 343 pg/mL — ABNORMAL HIGH (ref 0.0–100.0)

## 2021-05-19 LAB — MRSA NEXT GEN BY PCR, NASAL: MRSA by PCR Next Gen: NOT DETECTED

## 2021-05-19 MED ORDER — ACETAMINOPHEN 325 MG PO TABS
650.0000 mg | ORAL_TABLET | ORAL | Status: DC | PRN
  Administered 2021-05-20: 650 mg
  Filled 2021-05-19: qty 2

## 2021-05-19 MED ORDER — SENNOSIDES-DOCUSATE SODIUM 8.6-50 MG PO TABS
2.0000 | ORAL_TABLET | Freq: Every day | ORAL | Status: DC
  Administered 2021-05-19 – 2021-05-25 (×7): 2
  Filled 2021-05-19 (×5): qty 2

## 2021-05-19 MED ORDER — BISACODYL 10 MG RE SUPP: 10.0000 mg | Freq: Every day | RECTAL | Status: DC | PRN

## 2021-05-19 MED ORDER — ATORVASTATIN CALCIUM 40 MG PO TABS
40.0000 mg | ORAL_TABLET | Freq: Every day | ORAL | Status: DC
  Administered 2021-05-19 – 2021-05-25 (×7): 40 mg
  Filled 2021-05-19 (×7): qty 1

## 2021-05-19 MED ORDER — ACETAMINOPHEN 325 MG PO TABS: 325.0000 mg | ORAL_TABLET | ORAL | Status: DC | PRN

## 2021-05-19 MED ORDER — PANTOPRAZOLE 2 MG/ML SUSPENSION
40.0000 mg | Freq: Two times a day (BID) | ORAL | Status: DC
  Administered 2021-05-19 – 2021-05-25 (×13): 40 mg
  Filled 2021-05-19 (×15): qty 20

## 2021-05-19 MED ORDER — ORAL CARE MOUTH RINSE
15.0000 mL | Freq: Four times a day (QID) | OROMUCOSAL | Status: DC
  Administered 2021-05-20 – 2021-05-25 (×16): 15 mL via OROMUCOSAL

## 2021-05-19 MED ORDER — QUETIAPINE 12.5 MG HALF TABLET
12.5000 mg | ORAL_TABLET | Freq: Every day | ORAL | Status: DC
  Administered 2021-05-19 – 2021-05-25 (×7): 12.5 mg
  Filled 2021-05-19 (×8): qty 1

## 2021-05-19 MED ORDER — FREE WATER
300.0000 mL | Freq: Four times a day (QID) | Status: DC
  Administered 2021-05-20 (×3): 300 mL

## 2021-05-19 MED ORDER — SODIUM CHLORIDE 0.9% FLUSH: 3.0000 mL | INTRAVENOUS | Status: DC | PRN

## 2021-05-19 MED ORDER — FERROUS SULFATE 220 (44 FE) MG/5ML PO ELIX
220.0000 mg | ORAL_SOLUTION | Freq: Two times a day (BID) | ORAL | Status: DC
  Filled 2021-05-19 (×3): qty 5

## 2021-05-19 MED ORDER — LIP MEDEX EX OINT
TOPICAL_OINTMENT | CUTANEOUS | Status: DC | PRN
  Filled 2021-05-19: qty 7

## 2021-05-19 MED ORDER — SODIUM CHLORIDE 0.9 % IV SOLN: 250.0000 mL | INTRAVENOUS | Status: DC | PRN

## 2021-05-19 MED ORDER — POLYETHYLENE GLYCOL 3350 17 G PO PACK: 17.0000 g | PACK | Freq: Every day | ORAL | Status: DC | PRN

## 2021-05-19 MED ORDER — MELATONIN 5 MG PO TABS
5.0000 mg | ORAL_TABLET | Freq: Every evening | ORAL | Status: DC | PRN
  Administered 2021-05-19 – 2021-05-21 (×2): 5 mg
  Filled 2021-05-19 (×2): qty 1

## 2021-05-19 MED ORDER — SUCRALFATE 1 GM/10ML PO SUSP
1.0000 g | Freq: Three times a day (TID) | ORAL | Status: DC
  Administered 2021-05-19 – 2021-05-26 (×20): 1 g
  Filled 2021-05-19 (×19): qty 10

## 2021-05-19 MED ORDER — CHLORHEXIDINE GLUCONATE 0.12 % MT SOLN
15.0000 mL | Freq: Two times a day (BID) | OROMUCOSAL | Status: DC
  Administered 2021-05-19 – 2021-05-25 (×10): 15 mL via OROMUCOSAL
  Filled 2021-05-19 (×13): qty 15

## 2021-05-19 MED ORDER — PANTOPRAZOLE SODIUM 40 MG PO PACK
40.0000 mg | PACK | Freq: Two times a day (BID) | ORAL | Status: DC
Start: 2021-05-19 — End: 2021-05-19

## 2021-05-19 MED ORDER — SODIUM CHLORIDE 0.9% IV SOLUTION
Freq: Once | INTRAVENOUS | Status: AC
  Administered 2021-05-20: 10 mL/h via INTRAVENOUS

## 2021-05-19 MED ORDER — GUAIFENESIN-DM 100-10 MG/5ML PO SYRP: 5.0000 mL | ORAL_SOLUTION | Freq: Four times a day (QID) | ORAL | Status: DC | PRN

## 2021-05-19 MED ORDER — FERROUS SULFATE 300 (60 FE) MG/5ML PO SYRP
300.0000 mg | ORAL_SOLUTION | Freq: Two times a day (BID) | ORAL | Status: DC
  Administered 2021-05-20 – 2021-05-22 (×6): 300 mg
  Filled 2021-05-19 (×6): qty 5

## 2021-05-19 MED ORDER — ALUM & MAG HYDROXIDE-SIMETH 200-200-20 MG/5ML PO SUSP: 30.0000 mL | ORAL | Status: DC | PRN

## 2021-05-19 MED ORDER — LEVOTHYROXINE SODIUM 75 MCG PO TABS
75.0000 ug | ORAL_TABLET | Freq: Every day | ORAL | Status: DC
  Administered 2021-05-20 – 2021-05-26 (×7): 75 ug
  Filled 2021-05-19 (×7): qty 1

## 2021-05-19 MED ORDER — NEPRO/CARBSTEADY PO LIQD
315.0000 mL | Freq: Three times a day (TID) | ORAL | Status: DC
  Administered 2021-05-20 (×2): 315 mL
  Filled 2021-05-19 (×3): qty 474

## 2021-05-19 MED ORDER — FUROSEMIDE 10 MG/ML IJ SOLN
20.0000 mg | Freq: Two times a day (BID) | INTRAMUSCULAR | Status: DC
  Administered 2021-05-19: 20 mg via INTRAVENOUS
  Filled 2021-05-19: qty 2

## 2021-05-19 MED ORDER — ONDANSETRON HCL 4 MG/2ML IJ SOLN: 4.0000 mg | Freq: Four times a day (QID) | INTRAMUSCULAR | Status: DC | PRN

## 2021-05-19 MED ORDER — SODIUM CHLORIDE 0.9% FLUSH
3.0000 mL | Freq: Two times a day (BID) | INTRAVENOUS | Status: DC
  Administered 2021-05-20 – 2021-05-25 (×12): 3 mL via INTRAVENOUS

## 2021-05-19 NOTE — ED Provider Notes (Addendum)
?[facility] ?Provider Note ? ? ?CSN: [id number] ?Arrival date & time: 05/19/21  1206 ? ?  ? ?History ? ?Chief Complaint  ?Patient presents with  ? Shortness of Breath  ? ? ?Gregory [name] is a 88 y.o. male. ? ?HPI ? ?88 year old male with past medical history of aortic stenosis, HTN, HLD, paroxysmal atrial flutter and heart block with pacemaker in place not on anticoagulation, dysphagia with G-tube in place, recent admission in February secondary to GI bleed, recent outpatient pneumonia treated with antibiotics presents emergency department with concern for hypoxia.  Patient was hypoxic with EMS to 88% on room air.  He arrives on 3 L with no respiratory distress.  Last dose of antibiotics was about a week ago.  Cough is significantly improved but patient still feels short of breath.  No significant swelling of the lower extremities.  No hemoptysis.  No fever.  No active chest/back pain.  Patient has otherwise been compliant with his medications. ? ?Home Medications ?Prior to Admission medications   ?Medication Sig Start Date End Date Taking? Authorizing Provider  ?acetaminophen (TYLENOL) 325 MG tablet Place 1-2 tablets (325-650 mg total) into feeding tube every 4 (four) hours as needed for mild pain. 03/17/21   Love, Ivan Anchors, PA-C  ?alum & mag hydroxide-simeth (MAALOX/MYLANTA) 200-200-20 MG/5ML suspension Place 30 mLs into feeding tube every 4 (four) hours as needed for indigestion. 04/05/21   Lajean Manes, MD  ?atorvastatin (LIPITOR) 40 MG tablet Place 1 tablet (40 mg total) into feeding tube at bedtime. 04/05/21   Lajean Manes, MD  ?bisacodyl (DULCOLAX) 10 MG suppository Place 1 suppository (10 mg total) rectally daily as needed for moderate constipation. 04/05/21   Lajean Manes, MD  ?chlorhexidine (PERIDEX) 0.12 % solution 15 mLs by Mouth Rinse route 2 (two) times daily. 04/05/21   Lajean Manes, MD  ?clopidogrel (PLAVIX) 75 MG tablet Take 1 tablet (75 mg total) by mouth daily.  04/28/21   Martinique, Peter M, MD  ?ferrous sulfate 220 (44 Fe) MG/5ML solution Place 5 mLs (220 mg total) into feeding tube 2 (two) times daily with a meal. 04/14/21   Love, Ivan Anchors, PA-C  ?guaiFENesin-dextromethorphan (ROBITUSSIN DM) 100-10 MG/5ML syrup Place 5-10 mLs into feeding tube every 6 (six) hours as needed for cough. 04/05/21   Lajean Manes, MD  ?K Phos Mono-Sod Phos Sharma Covert & Mono (PHOSPHA 250 NEUTRAL) [phone] MG TABS PLACE 1 TABLET (250 MG TOTAL) INTO FEEDING TUBE DAILY. 05/08/21   Lovorn, Jinny Blossom, MD  ?levothyroxine (SYNTHROID) 75 MCG tablet Place 1 tablet (75 mcg total) into feeding tube daily at 6 (six) AM. 04/06/21   Lajean Manes, MD  ?lip balm (CARMEX) ointment Apply topically as needed for lip care. 04/05/21   Lajean Manes, MD  ?melatonin 5 MG TABS Place 1 tablet (5 mg total) into feeding tube at bedtime as needed. 04/14/21   Bary Leriche, PA-C  ?Mouthwashes (MOUTH RINSE) LIQD solution 15 mLs by Mouth Rinse route 4 (four) times daily. 04/05/21   Lajean Manes, MD  ?Nutritional Supplements (FEEDING SUPPLEMENT, NEPRO CARB STEADY,) LIQD Place 315 mLs into feeding tube 3 (three) times daily. 04/05/21   Lajean Manes, MD  ?pantoprazole sodium (PROTONIX) 40 mg Place 40 mg into feeding tube 2 (two) times daily before a meal. 04/14/21   Love, Ivan Anchors, PA-C  ?polyethylene glycol (MIRALAX / GLYCOLAX) 17 g packet Place 17 g into feeding tube daily as needed for mild constipation. 04/05/21   Lajean Manes, MD  ?QUEtiapine (  SEROQUEL) 25 MG tablet Place 0.5 tablets (12.5 mg total) into feeding tube at bedtime. 04/14/21   Love, Ivan Anchors, PA-C  ?senna-docusate (SENOKOT-S) 8.6-50 MG tablet Place 2 tablets into feeding tube at bedtime. 04/14/21   Love, Ivan Anchors, PA-C  ?sucralfate (CARAFATE) 1 GM/10ML suspension Place 10 mLs (1 g total) into feeding tube every 8 (eight) hours. 04/14/21   Bary Leriche, PA-C  ?Water For Irrigation, Sterile (FREE WATER) SOLN Place 300 mLs into feeding tube 4 (four) times daily. 04/05/21   Lajean Manes, MD  ?    ? ?Allergies    ?Cefepime, Amlodipine besylate, and Penicillins   ? ?Review of Systems   ?Review of Systems  ?Constitutional:  Positive for chills and fatigue. Negative for fever.  ?Respiratory:  Positive for cough and shortness of breath.   ?Cardiovascular:  Negative for chest pain, palpitations and leg swelling.  ?Gastrointestinal:  Negative for abdominal pain, diarrhea and vomiting.  ?Musculoskeletal:  Negative for back pain.  ?Skin:  Negative for rash.  ?Neurological:  Negative for headaches.  ? ?Physical Exam ?Updated Vital Signs ?BP 121/66   Pulse 71   Temp 98.3 ?F (36.8 ?C) (Oral)   Resp 17   Ht '5\' 8"'$  (1.727 m)   Wt 79.4 kg   SpO2 97%   BMI 26.61 kg/m?  ?Physical Exam ?Vitals and nursing note reviewed.  ?Constitutional:   ?   Appearance: Normal appearance. He is ill-appearing.  ?HENT:  ?   Head: Normocephalic.  ?   Mouth/Throat:  ?   Mouth: Mucous membranes are moist.  ?Cardiovascular:  ?   Rate and Rhythm: Normal rate.  ?Pulmonary:  ?   Effort: Pulmonary effort is normal. No respiratory distress.  ?   Breath sounds: Decreased breath sounds and rales present.  ?Abdominal:  ?   Palpations: Abdomen is soft.  ?   Tenderness: There is no abdominal tenderness.  ?Musculoskeletal:  ?   Right lower leg: Edema present.  ?   Left lower leg: Edema present.  ?Skin: ?   General: Skin is warm.  ?Neurological:  ?   Mental Status: He is alert and oriented to person, place, and time. Mental status is at baseline.  ?Psychiatric:     ?   Mood and Affect: Mood normal.  ? ? ?ED Results / Procedures / Treatments   ?Labs ?(all labs ordered are listed, but only abnormal results are displayed) ?Labs Reviewed  ?CBC WITH DIFFERENTIAL/PLATELET - Abnormal; Notable for the following components:  ?    Result Value  ? RBC 2.54 (*)   ? Hemoglobin 7.9 (*)   ? HCT 25.5 (*)   ? MCV 100.4 (*)   ? RDW 16.7 (*)   ? Monocytes Absolute 1.1 (*)   ? All other components within normal limits  ?RESP PANEL BY RT-PCR (FLU A&B, COVID) [id number]   ?COMPREHENSIVE METABOLIC PANEL  ?BRAIN NATRIURETIC PEPTIDE  ?TROPONIN I (HIGH SENSITIVITY)  ? ? ?EKG ?EKG Interpretation ? ?Date/Time:  Friday May 19 2021 12:19:25 EDT ?Ventricular Rate:  76 ?PR Interval:    ?QRS Duration: 157 ?QT Interval:  424 ?QTC Calculation: 477 ?R Axis:   -72 ?Text Interpretation: Accelerated junctional rhythm Nonspecific IVCD with LAD LVH with secondary repolarization abnormality Probable inferior infarct, acute Anterior infarct, old >>> Acute MI <<< Paced, unchanged from previous Confirmed by Lavenia Atlas [phone]) on 05/19/2021 1:29:15 PM ? ?Radiology ?DG Chest 2 View ? ?Result Date: 05/19/2021 ?CLINICAL DATA:  Shortness of breath/hypoxia.  Recent pneumonia.  EXAM: CHEST - 2 VIEW COMPARISON:  04/01/2021 FINDINGS: Left-sided pacemaker unchanged. Lungs are adequately inflated demonstrate worsening hazy opacification over the left base with obscuration of the hemidiaphragm and costophrenic angle. Findings compatible with worsening moderate effusion with associated atelectasis. Infection in the left base is possible. Hazy bilateral perihilar opacification which may be due to edema or infection. Cardiomediastinal silhouette and remainder of the exam is unchanged. IMPRESSION: 1. Worsening moderate left pleural effusion likely with associated atelectasis. Infection in the left base is possible. 2. Hazy bilateral perihilar opacification which may be due to edema or infection. Electronically Signed   By: Marin Olp M.D.   On: 05/19/2021 14:48   ? ?Procedures ?Procedures  ? ? ?Medications Ordered in ED ?Medications - No data to display ? ?ED Course/ Medical Decision Making/ A&P ?  ?                        ?Medical Decision Making ?Amount and/or Complexity of Data Reviewed ?Labs: ordered. ?Radiology: ordered. ? ?Risk ?Decision regarding hospitalization. ? ? ?88 year old male presents emergency department with shortness of breath, hypoxia with EMS on room air which is his baseline.  Currently improved  on 2 L nasal cannula, no respiratory distress.  Decreased breath sounds at bases with scattered rales more prominent on right. ? ?EKG is unchanged for the patient.  Chest x-ray shows left-sided pleural effusion with

## 2021-05-19 NOTE — Progress Notes (Signed)
Admission Skin Assessment Note: ? ?R Great Toe: Known dry gangrene ?    Eschar 1 x 2  ? ?L Great Toe: Known dry gangrene ?     Eschar 2 x 3  ? ?R Lateral Foot: Known dry gangrene ?     Callous area with dark brown/black discoloration 3 x 5 ? ?L Lateral Foot: Known dry gangrene  ?     Callous area with dark brown/black discoloration 2 x 1.5 ? ?Sacrum is red, but blanchable ? ?Bilateral heels are red and non-blanchable and boggy feeling.  The R is more boggy than the L.   ?

## 2021-05-19 NOTE — Plan of Care (Signed)

## 2021-05-19 NOTE — ED Notes (Signed)
Daughter at bedside.

## 2021-05-19 NOTE — H&P (Signed)
?History and Physical  ? ? ?Gregory [name] SEG:[id number] DOB: 04/15/1920 DOA: 05/19/2021 ? ?PCP: Orpah Greek, DO (Confirm with patient/family/NH records and if not entered, this has to be entered at [facility] point of entry) ?Patient coming from: Home ? ?I have personally briefly reviewed patient's old medical records in [place] ? ?Chief Complaint: Cough, SOB ? ?HPI: Gregory [name] is a 88 y.o. male with medical history significant of  ?PAF status post ablation off Eliquis due to severe GI bleed with chronic iron deficiency anemia, moderate pulmonary hypertension with severe TR (Echo 2020), dysphagia status post PEG tube, PPM, AS status post TAVR, PVD with chronic bilateral toe gangrene changes on Plavix, PPM, hypothyroidism, presented with increasing shortness of breath. ? ?Patient started to have increasing shortness of breath 2 weeks ago with a productive cough was clear phlegm, no fever chills no chest pains.  Went to see PCP, was started on 7 days course of p.o. antibiotics which completed 4 days ago.  However patient continued to experience increasing shortness of breath and productive cough of clear/whitish phlegm worse at night. ? ?Chronic iron deficiency anemia, hemoglobin 8.6 on last admission, PCP checked hemoglobin last week was 7.6, and patient was referred to [facility] for PRBC transfusion next Tuesday.  Patient denies any abdominal pain, no significant black tarry stool.  Pulmonary discontinue patient's Plavix 3 days ago. ? ?Patient has been on PEG tube feeding secondary to dysphagia, denied any nauseous vomiting no history of aspiration, no choking or coughing at baseline. ? ?ED Course: Patient was found to be borderline hypoxic and stabilized on 1 L of nasal cannula.  Blood pressure stable no tachycardia.  Hemoglobin 7.9 compared to 8.6 on last admission.  Creatinine 1.3, BUN 48. ? ?Pearline Cables showed bilateral lung congestion with worsening of left-sided pleural effusion. ? ?Review of  Systems: As per HPI otherwise 14 point review of systems negative.  ? ? ?Past Medical History:  ?Diagnosis Date  ? Atrial flutter ([place])   ? s/p ablation  ? Cancer [facility])   ? skin cancer  ? Cataracts, bilateral   ? Hypertension   ? Neuromuscular disorder ([place])   ? Pacemaker 2010  ? Thyroid disease   ? ? ?Past Surgical History:  ?Procedure Laterality Date  ? APPENDECTOMY    ? COLONOSCOPY    ? EYE SURGERY Bilateral   ? cataract surgery  ? HERNIA REPAIR Right   ? INGUINAL HERNIA REPAIR Left 10/12/2013  ? Procedure: LEFT  INGUINAL HERNIA REPAIR;  Surgeon: Odis Hollingshead, MD;  Location: [place];  Service: General;  Laterality: Left;  ? INSERTION OF MESH Left 10/12/2013  ? Procedure: INSERTION OF MESH;  Surgeon: Odis Hollingshead, MD;  Location: [place];  Service: General;  Laterality: Left;  ? PEG TUBE PLACEMENT Left   ? ? ? reports that he has never smoked. He has never used smokeless tobacco. He reports that he does not drink alcohol and does not use drugs. ? ?Allergies  ?Allergen Reactions  ? Cefepime Other (See Comments)  ?  Severe myoclonus for cefepime- don't take again-   ? Amlodipine Besylate   ?  Other reaction(s): edema  ? Penicillins Hives and Rash  ? ? ?Family History  ?Problem Relation Age of Onset  ? Heart disease Mother   ? Cancer Father   ?     leukemia  ? ? ? ?Prior to Admission medications   ?Medication Sig Start Date End Date Taking? Authorizing Provider  ?acetaminophen (  TYLENOL) 325 MG tablet Place 1-2 tablets (325-650 mg total) into feeding tube every 4 (four) hours as needed for mild pain. 03/17/21   Love, Ivan Anchors, PA-C  ?alum & mag hydroxide-simeth (MAALOX/MYLANTA) 200-200-20 MG/5ML suspension Place 30 mLs into feeding tube every 4 (four) hours as needed for indigestion. 04/05/21   Lajean Manes, MD  ?atorvastatin (LIPITOR) 40 MG tablet Place 1 tablet (40 mg total) into feeding tube at bedtime. 04/05/21   Lajean Manes, MD  ?bisacodyl (DULCOLAX) 10 MG suppository Place 1 suppository (10 mg total) rectally  daily as needed for moderate constipation. 04/05/21   Lajean Manes, MD  ?chlorhexidine (PERIDEX) 0.12 % solution 15 mLs by Mouth Rinse route 2 (two) times daily. 04/05/21   Lajean Manes, MD  ?clopidogrel (PLAVIX) 75 MG tablet Take 1 tablet (75 mg total) by mouth daily. 04/28/21   Martinique, Peter M, MD  ?ferrous sulfate 220 (44 Fe) MG/5ML solution Place 5 mLs (220 mg total) into feeding tube 2 (two) times daily with a meal. 04/14/21   Love, Ivan Anchors, PA-C  ?guaiFENesin-dextromethorphan (ROBITUSSIN DM) 100-10 MG/5ML syrup Place 5-10 mLs into feeding tube every 6 (six) hours as needed for cough. 04/05/21   Lajean Manes, MD  ?K Phos Mono-Sod Phos Sharma Covert & Mono (PHOSPHA 250 NEUTRAL) [phone] MG TABS PLACE 1 TABLET (250 MG TOTAL) INTO FEEDING TUBE DAILY. 05/08/21   Lovorn, Jinny Blossom, MD  ?levothyroxine (SYNTHROID) 75 MCG tablet Place 1 tablet (75 mcg total) into feeding tube daily at 6 (six) AM. 04/06/21   Lajean Manes, MD  ?lip balm (CARMEX) ointment Apply topically as needed for lip care. 04/05/21   Lajean Manes, MD  ?melatonin 5 MG TABS Place 1 tablet (5 mg total) into feeding tube at bedtime as needed. 04/14/21   Bary Leriche, PA-C  ?Mouthwashes (MOUTH RINSE) LIQD solution 15 mLs by Mouth Rinse route 4 (four) times daily. 04/05/21   Lajean Manes, MD  ?Nutritional Supplements (FEEDING SUPPLEMENT, NEPRO CARB STEADY,) LIQD Place 315 mLs into feeding tube 3 (three) times daily. 04/05/21   Lajean Manes, MD  ?pantoprazole sodium (PROTONIX) 40 mg Place 40 mg into feeding tube 2 (two) times daily before a meal. 04/14/21   Love, Ivan Anchors, PA-C  ?polyethylene glycol (MIRALAX / GLYCOLAX) 17 g packet Place 17 g into feeding tube daily as needed for mild constipation. 04/05/21   Lajean Manes, MD  ?QUEtiapine (SEROQUEL) 25 MG tablet Place 0.5 tablets (12.5 mg total) into feeding tube at bedtime. 04/14/21   Love, Ivan Anchors, PA-C  ?senna-docusate (SENOKOT-S) 8.6-50 MG tablet Place 2 tablets into feeding tube at bedtime. 04/14/21   Love, Ivan Anchors, PA-C   ?sucralfate (CARAFATE) 1 GM/10ML suspension Place 10 mLs (1 g total) into feeding tube every 8 (eight) hours. 04/14/21   Bary Leriche, PA-C  ?Water For Irrigation, Sterile (FREE WATER) SOLN Place 300 mLs into feeding tube 4 (four) times daily. 04/05/21   Lajean Manes, MD  ? ? ?Physical Exam: ?Vitals:  ? 05/19/21 1330 05/19/21 1415 05/19/21 1430 05/19/21 1500  ?BP: 121/66   115/90  ?Pulse: 69 71 68 70  ?Resp: '20 17 17 '$ (!) 21  ?Temp:      ?TempSrc:      ?SpO2: 98% 97% 96% 93%  ?Weight:      ?Height:      ? ? ?Constitutional: NAD, calm, comfortable ?Vitals:  ? 05/19/21 1330 05/19/21 1415 05/19/21 1430 05/19/21 1500  ?BP: 121/66   115/90  ?Pulse: 69 71 68 70  ?  Resp: '20 17 17 '$ (!) 21  ?Temp:      ?TempSrc:      ?SpO2: 98% 97% 96% 93%  ?Weight:      ?Height:      ? ?Eyes: PERRL, lids and conjunctivae normal ?ENMT: Mucous membranes are moist. Posterior pharynx clear of any exudate or lesions.Normal dentition.  ?Neck: normal, supple, no masses, no thyromegaly ?Respiratory: clear to auscultation bilaterally, no wheezing, bilateral fine crackles to the mid level, increasing breathing effort, talking in broken sentences. No accessory muscle use.  ?Cardiovascular: Regular rate and rhythm, no murmurs / rubs / gallops. No extremity edema. 2+ pedal pulses. No carotid bruits.  ?Abdomen: no tenderness, no masses palpated. No hepatosplenomegaly. Bowel sounds positive.  ?Musculoskeletal: no clubbing / cyanosis. No joint deformity upper and lower extremities. Good ROM, no contractures. Normal muscle tone.  ?Skin: no rashes, lesions, ulcers. No induration.  Several gangrene-like changes on bilateral toes including left big toe wound fourth toe. ?Neurologic: CN 2-12 grossly intact. Sensation intact, DTR normal. Strength 5/5 in all 4.  ?Psychiatric: Normal judgment and insight. Alert and oriented x 3. Normal mood.  ? ? ? ?Labs on Admission: I have personally reviewed following labs and imaging studies ? ?CBC: ?Recent Labs  ?Lab  05/19/21 ?1230  ?WBC 8.9  ?NEUTROABS 6.2  ?HGB 7.9*  ?HCT 25.5*  ?MCV 100.4*  ?PLT 190  ? ?Basic Metabolic Panel: ?Recent Labs  ?Lab 05/19/21 ?1230  ?NA 137  ?K 4.7  ?CL 106  ?CO2 26  ?GLUCOSE 122*  ?BUN 48*  ?CREATININE

## 2021-05-19 NOTE — ED Triage Notes (Signed)
[facility] reports pt coming from home c/o low pulse ox. On RA was reading 88%. Pt with recent PNA and treatment for it. EMS placed pt on 3L [place] and pulse ox 96%. Pt does have feeding tube. Nothing PO. ?

## 2021-05-20 ENCOUNTER — Inpatient Hospital Stay (HOSPITAL_COMMUNITY): Payer: Medicare Other

## 2021-05-20 DIAGNOSIS — I5031 Acute diastolic (congestive) heart failure: Secondary | ICD-10-CM

## 2021-05-20 DIAGNOSIS — I4891 Unspecified atrial fibrillation: Secondary | ICD-10-CM | POA: Insufficient documentation

## 2021-05-20 DIAGNOSIS — H9190 Unspecified hearing loss, unspecified ear: Secondary | ICD-10-CM | POA: Diagnosis not present

## 2021-05-20 DIAGNOSIS — I480 Paroxysmal atrial fibrillation: Secondary | ICD-10-CM | POA: Diagnosis present

## 2021-05-20 DIAGNOSIS — R1312 Dysphagia, oropharyngeal phase: Secondary | ICD-10-CM | POA: Diagnosis not present

## 2021-05-20 DIAGNOSIS — E785 Hyperlipidemia, unspecified: Secondary | ICD-10-CM | POA: Diagnosis not present

## 2021-05-20 DIAGNOSIS — I10 Essential (primary) hypertension: Secondary | ICD-10-CM

## 2021-05-20 DIAGNOSIS — I739 Peripheral vascular disease, unspecified: Secondary | ICD-10-CM | POA: Diagnosis present

## 2021-05-20 DIAGNOSIS — D649 Anemia, unspecified: Secondary | ICD-10-CM | POA: Diagnosis present

## 2021-05-20 DIAGNOSIS — E43 Unspecified severe protein-calorie malnutrition: Secondary | ICD-10-CM

## 2021-05-20 DIAGNOSIS — Z8616 Personal history of COVID-19: Secondary | ICD-10-CM | POA: Diagnosis present

## 2021-05-20 DIAGNOSIS — E46 Unspecified protein-calorie malnutrition: Secondary | ICD-10-CM | POA: Diagnosis present

## 2021-05-20 DIAGNOSIS — I509 Heart failure, unspecified: Secondary | ICD-10-CM

## 2021-05-20 DIAGNOSIS — E079 Disorder of thyroid, unspecified: Secondary | ICD-10-CM

## 2021-05-20 LAB — ECHOCARDIOGRAM COMPLETE
AR max vel: 2.6 cm2
AV Area VTI: 2.92 cm2
AV Area mean vel: 2.88 cm2
AV Mean grad: 11 mmHg
AV Peak grad: 21.3 mmHg
Ao pk vel: 2.31 m/s
Area-P 1/2: 3.83 cm2
Height: 68 in
S' Lateral: 2.8 cm
Weight: 2800 oz

## 2021-05-20 LAB — BASIC METABOLIC PANEL
Anion gap: 8 (ref 5–15)
BUN: 46 mg/dL — ABNORMAL HIGH (ref 8–23)
CO2: 24 mmol/L (ref 22–32)
Calcium: 9.2 mg/dL (ref 8.9–10.3)
Chloride: 107 mmol/L (ref 98–111)
Creatinine, Ser: 1.39 mg/dL — ABNORMAL HIGH (ref 0.61–1.24)
GFR, Estimated: 46 mL/min — ABNORMAL LOW (ref >60)
Glucose, Bld: 114 mg/dL — ABNORMAL HIGH (ref 70–99)
Potassium: 4.5 mmol/L (ref 3.5–5.1)
Sodium: 139 mmol/L (ref 135–145)

## 2021-05-20 MED ORDER — FREE WATER
200.0000 mL | Freq: Four times a day (QID) | Status: DC
  Administered 2021-05-20 – 2021-05-21 (×3): 200 mL

## 2021-05-20 MED ORDER — FREE WATER: 100.0000 mL | Status: DC

## 2021-05-20 MED ORDER — JEVITY 1.2 CAL PO LIQD
1000.0000 mL | ORAL | Status: DC
  Administered 2021-05-20: 1000 mL
  Filled 2021-05-20 (×2): qty 1000

## 2021-05-20 NOTE — Assessment & Plan Note (Addendum)
Ventricular paced rhythm. ? ?Patient not on anticoagulation due to history of gastrointestinal bleeding.  ?Not on AV blocking agents.  ?

## 2021-05-20 NOTE — Assessment & Plan Note (Signed)
Continue with statin therapy.  ?

## 2021-05-20 NOTE — Assessment & Plan Note (Addendum)
Tolerating well tube feedings.  ?Patient was placed on continuous tube feeding during his hospitalization, at the time of his discharge will resume with bolus feedings.  ?

## 2021-05-20 NOTE — Assessment & Plan Note (Addendum)
Patient was admitted to the cardiac ward and was placed on furosemide for diuresis, negative fluid balance was achieved, -6,242 ml, with significant improvement in his symptoms.  ? ?Further work up with echocardiogram with LV EF 55%, with no wall motion abnormalities, RV systolic function preserved, moderate TR, no aortic stenosis (SP TAVR).  ? ?Acute hypoxemic respiratory failure due to cardiogenic pulmonary edema. ? ?Radiographic follow up showed improvement in pulmonary edema.   ?At the time of his discharge his oxygenation was 92%. ?Check home 02 screen before his discharge.  ? ?

## 2021-05-20 NOTE — Progress Notes (Signed)
?Progress Note ? ? ?Patient: Gregory [name] EHU:[id number] DOB: February 16, 1920 DOA: 05/19/2021     1 ?DOS: the patient was seen and examined on 05/20/2021 ?  ?Brief hospital course: ?Gregory [name] was admitted to the hospital with the working diagnosis of decompensated heart failure.  ? ?88 yo male with the past medical history of paroxysmal atrial fibrillation, pulmonary hypertension, peripheral vascular disease with bilateral toe gangrene, dysphagia sp peg tube and aortic stenosis sp TAVR.  ?Reported worsening dyspnea for 2 weeks, associated with productive cough. As outpatient he was treated with oral antibiotics with no improvement in his symptoms. On his initial physical examination his blood pressure was 121/66, HR 69, RR 20, 02 saturation 97%. Lungs with bilateral rales and increased work of breathing, heart with S1 and S2 present and rhythmic, abdomen not distended, no lower extremity edema, several gangrene like changes on bilateral toes.  ? ?Na 137, K 4,7 Cl 106, bicarbonate 26, glucose 122, bun 48 cr 1,37 ?BNP 343  ?High sensitive troponin 24 and 24  ?Wbc 8,9 hgb 7,9 plt 190  ?SARS covid 19 positive (positive in 03/2021)  ? ?Chest radiograph with bilateral interstitial infiltrates with bilateral hilar congestion and bilateral moderate pleural effusions.  ? ?EKG 76 bpm, left axis deviation, ventricular paced rhythm, with no significant ST segment or T wave changes.  ? ?Assessment and Plan: ?* CHF (congestive heart failure), NYHA class IV (Gregory [name]) ?Patient with improved volume status. ? ?Pending echocardiogram. ? ?Plan to continue blood pressure monitoring. ?Hold on diuresis for now. ?Follow up chest radiograph with improvement in infiltrates.  ? ?Anemia ?Symptomatic anemia. ?Had one unit PRBC transfused ?Follow up on hgb. ? ?Continue oral iron supplementation for iron deficiency anemia, likely component of anemia of chronic disease. ?Check iron panel.  ? ?Paroxysmal atrial fibrillation (Gregory [name]) ?Ventricular paced  rhythm. ?Continue telemetry monitoring.  ?Patient not on anticoagulation due to history of gastrointestinal bleeding.  ? ?Hypertension ?Blood pressure has been 109 to 785 mmHg systolic.  ?Continue close monitoring. ?Holding on antihypertensive medications.  ? ? ? ?Protein calorie malnutrition (Gregory [name]) ?Patient on tube feedings. ?Consulted nutrition for recommendations.  ? ?Dysphagia s/p G tube  ?Resume tube feedings.  ? ?Deafness ?Patient high risk for hospital delirium.  ? ?Dyslipidemia ?Continue with statin therapy.  ? ?Thyroid disease ?Continue with levothyroxine ? ? ?History of COVID-19 ?Positive COVID 19 on 2/17, continue positive test after 7 weeks. ?No clinical signs of active viral infection. ?Discontinue respiratory isolation.  ?PT and OT ?Patient very weak and deconditioned.  ? ?PVD (peripheral vascular disease) (Gregory [name]) ?Bilateral gangrene toes, no signs of local bacterial infection. ?Plan for conservative care.  ? ? ? ? ?  ? ?Subjective: Patient very weak and deconditioned, poor communication.  ? ?Physical Exam: ?Vitals:  ? 05/20/21 0239 05/20/21 0336 05/20/21 0700 05/20/21 0800  ?BP: (!) 105/47 (!) 111/49 (!) 115/51 116/60  ?Pulse: 70 69  73  ?Resp: 18 20 (!) 25 20  ?Temp: 97.9 ?F (36.6 ?C) 97.9 ?F (36.6 ?C) 97.8 ?F (36.6 ?C) 97.8 ?F (36.6 ?C)  ?TempSrc: Axillary Oral Oral Oral  ?SpO2: 95% 94% 94% 94%  ?Weight:      ?Height:      ? ?Neurology somnolent but easy to arouse ?ENT with positive pallor ?Cardiovascular with S1 and S2 present and rhythmic with positive murmur at the right sternal border systolic, no gallops ?No JVD ?No lower extremity edema ?Respiratory with scattered dry rales, with no wheezing ?Abdomen not distended peg tube in place.  ?Data  Reviewed: ? ? ? ?Family Communication: I spoke with patient's son at the bedside, we talked in detail about patient's condition, plan of care and prognosis and all questions were addressed. ? ? ?Disposition: ?Status is: Inpatient ?Remains inpatient appropriate  because: anemia  ? Planned Discharge Destination: Home ? ?Author: ?Gregory Millers, MD ?05/20/2021 9:10 AM ? ?For on call review [email or web address].  ?

## 2021-05-20 NOTE — Hospital Course (Addendum)
Mr. Sturdivant was admitted to the hospital with the working diagnosis of decompensated heart failure, cardiogenic pulmonary edema, acute kidney injury and bilateral pleural effusions.  ? ?88 yo male with the past medical history of paroxysmal atrial fibrillation, pulmonary hypertension, peripheral vascular disease with bilateral toe gangrene, dysphagia sp peg tube and aortic stenosis sp TAVR.  ?Reported worsening dyspnea for 2 weeks, associated with productive cough. As outpatient he was treated with oral antibiotics with no improvement in his symptoms. On his initial physical examination his blood pressure was 121/66, HR 69, RR 20, 02 saturation 97%. Lungs with bilateral rales and increased work of breathing, heart with S1 and S2 present and rhythmic, abdomen not distended, no lower extremity edema, several gangrene like changes on bilateral toes.  ? ?Na 137, K 4,7 Cl 106, bicarbonate 26, glucose 122, bun 48 cr 1,37 ?BNP 343  ?High sensitive troponin 24 and 24  ?Wbc 8,9 hgb 7,9 plt 190  ?SARS covid 19 positive (positive in 03/2021)  ? ?Chest radiograph with bilateral interstitial infiltrates with bilateral hilar congestion and bilateral moderate pleural effusions.  ? ?EKG 76 bpm, left axis deviation, ventricular paced rhythm, with no significant ST segment or T wave changes.  ? ?Patient had one unit PRBC and was placed on furosemide for diuresis.  ?Tube feedings were resumed.  ? ?His volume overload improved.  ?He received IV iron for iron deficiency. ? ?Plan to follow up as outpatient, home health services and palliative care.  ?

## 2021-05-20 NOTE — Assessment & Plan Note (Signed)
Continue with levothyroxine  

## 2021-05-20 NOTE — Progress Notes (Signed)
Initial Nutrition Assessment ? ?DOCUMENTATION CODES:  ? ?Not applicable ? ?INTERVENTION:  ? ?-Continue bolus feedings via PEG: ? ?315 ml Nepro TID ? ?300 ml free water flush 4 times daily ? ?Tube feeding regimen provides 1701 kcal (100% of needs), 77 grams of protein, and 687 ml of H2O. Total free water: 1887 ml daily ? ?NUTRITION DIAGNOSIS:  ? ?Inadequate oral intake related to dysphagia as evidenced by NPO status. ? ?GOAL:  ? ?Patient will meet greater than or equal to 90% of their needs ? ?MONITOR:  ? ?Labs, Weight trends, TF tolerance, Skin, I & O's ? ?REASON FOR ASSESSMENT:  ? ?Consult ?Enteral/tube feeding initiation and management ? ?ASSESSMENT:  ? ?Gregory [name] is a 88 y.o. male with medical history significant of   PAF status post ablation off Eliquis due to severe GI bleed with chronic iron deficiency anemia, moderate pulmonary hypertension with severe TR (Echo 2020), dysphagia status post PEG tube, PPM, AS status post TAVR, PVD with chronic bilateral toe gangrene changes on Plavix, PPM, hypothyroidism, presented with increasing shortness of breath. ? ?Pt admitted with CHF.  ? ?Reviewed I/O's: -130 ml x 24 hours ? ?UOP: 500 ml x 24 hours ? ?Pt unavailable at time of visit. Attempted to speak with pt via call to hospital room phone, however, unable to reach. RD unable to obtain further nutrition-related history or complete nutrition-focused physical exam at this time.   ?  ?Pt chronically on TF via PEG secondary to dysphagia. ? ?Reviewed wt hx; no wt loss noted over the past year.  ? ?Medications reviewed and include ferrous sulfate.  ? ?Labs reviewed: CBGS: 94.  ? ?Diet Order:   ?Diet Order   ? ?       ?  Diet NPO time specified  Diet effective now       ?  ? ?  ?  ? ?  ? ? ?EDUCATION NEEDS:  ? ?No education needs have been identified at this time ? ?Skin:  Skin Assessment: Reviewed RN Assessment ? ?Last BM:  Unknown ? ?Height:  ? ?Ht Readings from Last 1 Encounters:  ?05/19/21 '5\' 8"'$  (1.727 m)   ? ? ?Weight:  ? ?Wt Readings from Last 1 Encounters:  ?05/19/21 79.4 kg  ? ? ?Ideal Body Weight:  70 kg ? ?BMI:  Body mass index is 26.61 kg/m?. ? ?Estimated Nutritional Needs:  ? ?Kcal:  1700-1900 ? ?Protein:  80-95 grams ? ?Fluid:  > 1.7 L ? ? ? ?Loistine Chance, RD, LDN, CDCES ?Registered Dietitian II ?Certified Diabetes Care and Education Specialist ?Please refer to [facility] for RD and/or RD on-call/weekend/after hours pager  ?

## 2021-05-20 NOTE — Progress Notes (Signed)
CSW acknowledges consult for SNF/HH. The patient will require PT/OT evaluations. TOC will assist with disposition planning once the evaluations have been completed.  °  °TOC will continue to follow.    °

## 2021-05-20 NOTE — Assessment & Plan Note (Addendum)
Positive COVID 19 on 2/17, continue positive test after 7 weeks. ?No clinical signs of active viral infection. No specific treatment needed. ?Patient was taken off isolation.  ?Patient very weak and deconditioned.  ?

## 2021-05-20 NOTE — Assessment & Plan Note (Addendum)
Anemia of chronic disease, with thrombocytopenia.  ? ?Patient underwent 1 unit PRBC transfusion. ? ?Iron panel with serum iron 45, transferrin saturation 16, transferrin 202 and ferritin 506, consistent with anemia of chronic disease combined with iron deficiency.  ? ?Patient received 2 doses of IV iron with good toleration.  ? ?Plan to follow up iron panel as outpatient, hold on further PRBC transfusion for now to prevent volume overload.  ? ?His discharge hgb is 8.1 and plt 144. ?Plan to follow up cell count as outpatient. ?

## 2021-05-20 NOTE — Assessment & Plan Note (Addendum)
Patient on quetiapine.  ?Patient with improved mentation, more reactive with supportive medical therapy.  ?No encephalopathy.  ? ?

## 2021-05-20 NOTE — Assessment & Plan Note (Addendum)
Severe calorie protein malnutrition.  ?Patient on tube feedings. ?Nutrition was consulted and nutritional supplements were optimized.  ?

## 2021-05-20 NOTE — Assessment & Plan Note (Signed)
Bilateral gangrene toes, no signs of local bacterial infection. ?Plan for conservative care.  ?

## 2021-05-20 NOTE — Assessment & Plan Note (Addendum)
Blood pressure has remained stable. ?Holding antihypertensive medications.  ? ?

## 2021-05-21 ENCOUNTER — Inpatient Hospital Stay (HOSPITAL_COMMUNITY): Payer: Medicare Other

## 2021-05-21 DIAGNOSIS — D649 Anemia, unspecified: Secondary | ICD-10-CM | POA: Diagnosis not present

## 2021-05-21 DIAGNOSIS — E785 Hyperlipidemia, unspecified: Secondary | ICD-10-CM | POA: Diagnosis not present

## 2021-05-21 DIAGNOSIS — R1312 Dysphagia, oropharyngeal phase: Secondary | ICD-10-CM | POA: Diagnosis not present

## 2021-05-21 DIAGNOSIS — I509 Heart failure, unspecified: Secondary | ICD-10-CM | POA: Diagnosis not present

## 2021-05-21 DIAGNOSIS — I480 Paroxysmal atrial fibrillation: Secondary | ICD-10-CM

## 2021-05-21 LAB — BASIC METABOLIC PANEL
Anion gap: 6 (ref 5–15)
BUN: 44 mg/dL — ABNORMAL HIGH (ref 8–23)
CO2: 27 mmol/L (ref 22–32)
Calcium: 9.4 mg/dL (ref 8.9–10.3)
Chloride: 106 mmol/L (ref 98–111)
Creatinine, Ser: 1.37 mg/dL — ABNORMAL HIGH (ref 0.61–1.24)
GFR, Estimated: 47 mL/min — ABNORMAL LOW (ref >60)
Glucose, Bld: 99 mg/dL (ref 70–99)
Potassium: 4.8 mmol/L (ref 3.5–5.1)
Sodium: 139 mmol/L (ref 135–145)

## 2021-05-21 LAB — TYPE AND SCREEN
ABO/RH(D): A POS
Antibody Screen: NEGATIVE
Unit division: 0

## 2021-05-21 LAB — CBC
HCT: 25.1 % — ABNORMAL LOW (ref 39.0–52.0)
Hemoglobin: 7.9 g/dL — ABNORMAL LOW (ref 13.0–17.0)
MCH: 30.7 pg (ref 26.0–34.0)
MCHC: 31.5 g/dL (ref 30.0–36.0)
MCV: 97.7 fL (ref 80.0–100.0)
Platelets: 162 10*3/uL (ref 150–400)
RBC: 2.57 MIL/uL — ABNORMAL LOW (ref 4.22–5.81)
RDW: 16.8 % — ABNORMAL HIGH (ref 11.5–15.5)
WBC: 6.8 10*3/uL (ref 4.0–10.5)
nRBC: 0 % (ref 0.0–0.2)

## 2021-05-21 LAB — BPAM RBC
Blood Product Expiration Date: 202304222359
ISSUE DATE / TIME: 202304080009
Unit Type and Rh: 6200

## 2021-05-21 MED ORDER — FUROSEMIDE 10 MG/ML IJ SOLN
40.0000 mg | Freq: Two times a day (BID) | INTRAMUSCULAR | Status: DC
  Administered 2021-05-21 – 2021-05-22 (×3): 40 mg via INTRAVENOUS
  Filled 2021-05-21 (×3): qty 4

## 2021-05-21 MED ORDER — FREE WATER
300.0000 mL | Freq: Four times a day (QID) | Status: DC
  Administered 2021-05-21 – 2021-05-23 (×8): 300 mL

## 2021-05-21 MED ORDER — NEPRO/CARBSTEADY PO LIQD
1000.0000 mL | ORAL | Status: DC
  Administered 2021-05-21 – 2021-05-23 (×3): 1000 mL
  Filled 2021-05-21 (×6): qty 1000

## 2021-05-21 MED ORDER — PROSOURCE TF PO LIQD
45.0000 mL | Freq: Every day | ORAL | Status: DC
  Administered 2021-05-21 – 2021-05-24 (×4): 45 mL
  Filled 2021-05-21 (×4): qty 45

## 2021-05-21 MED ORDER — NEPRO/CARBSTEADY PO LIQD: 315.0000 mL | Freq: Three times a day (TID) | ORAL | Status: DC

## 2021-05-21 MED ORDER — FUROSEMIDE 10 MG/ML IJ SOLN: 40.0000 mg | Freq: Once | INTRAMUSCULAR | Status: DC

## 2021-05-21 NOTE — Progress Notes (Addendum)
?Progress Note ? ? ?Patient: Gregory [name] NOB:[id number] DOB: [birth year] DOA: 05/19/2021     2 ?DOS: the patient was seen and examined on 05/21/2021 ?  ?Brief hospital course: ?Gregory [name] was admitted to the hospital with the working diagnosis of decompensated heart failure.  ? ?88 yo male with the past medical history of paroxysmal atrial fibrillation, pulmonary hypertension, peripheral vascular disease with bilateral toe gangrene, dysphagia sp peg tube and aortic stenosis sp TAVR.  ?Reported worsening dyspnea for 2 weeks, associated with productive cough. As outpatient he was treated with oral antibiotics with no improvement in his symptoms. On his initial physical examination his blood pressure was 121/66, HR 69, RR 20, 02 saturation 97%. Lungs with bilateral rales and increased work of breathing, heart with S1 and S2 present and rhythmic, abdomen not distended, no lower extremity edema, several gangrene like changes on bilateral toes.  ? ?Na 137, K 4,7 Cl 106, bicarbonate 26, glucose 122, bun 48 cr 1,37 ?BNP 343  ?High sensitive troponin 24 and 24  ?Wbc 8,9 hgb 7,9 plt 190  ?SARS covid 19 positive (positive in 03/2021)  ? ?Chest radiograph with bilateral interstitial infiltrates with bilateral hilar congestion and bilateral moderate pleural effusions.  ? ?EKG 76 bpm, left axis deviation, ventricular paced rhythm, with no significant ST segment or T wave changes.  ? ?Patient had one unit PRBC and was placed on furosemide for diuresis.  ? ?Assessment and Plan: ?* CHF (congestive heart failure), NYHA class IV ([place]) ?Echocardiogram with LV EF 55%, with no wall motion abnormalities, RV systolic function preserved, moderate TR, no aortic stenosis (SP TAVR).  ? ?Chest film with persistent pulmonary edema and bilateral pleural effusions. ? ?Plan to resume diuresis to target further negative fluid balance.  ?Furosemide 40 mg Iv q12 hrs ?Strict in and out  ?Close follow up on blood pressure and renal function.   ? ?Anemia ?SP 1 unit PRBC transfusion. ? ?Follow up hgb is 7,9 with Plt 162 and wbc 6,8. ?No clinica signs of bleeding. ? ?Plan to continue follow up on cell count. ?Check iron panel.  ? ?Paroxysmal atrial fibrillation ([place]) ?Ventricular paced rhythm. ? ?Patient not on anticoagulation due to history of gastrointestinal bleeding.  ?Continue telemetry monitoring.  ?Not on AV blocking agents.  ? ?Hypertension ?Blood pressure has been stable 112 to 947 mmHg systolic. ?Continue diuresis with furosemide  ? ? ? ? ?Protein calorie malnutrition ([place]) ?Severe calorie protein malnutrition.  ?Patient on tube feedings. ?Consulted nutrition for recommendations.  ?Plan for continuous feedings for now.  ? ? ?Dysphagia s/p G tube  ?Tolerating well tube feedings.  ? ?Deafness ?Patient high risk for hospital delirium.  ?Mentation has improved, no agitation.  ? ?Dyslipidemia ?Continue with statin therapy.  ? ?Thyroid disease ?Continue with levothyroxine ? ? ?History of COVID-19 ?Positive COVID 19 on 2/17, continue positive test after 7 weeks. ?No clinical signs of active viral infection. ?Discontinue respiratory isolation.  ?PT and OT ?Patient very weak and deconditioned.  ? ?PVD (peripheral vascular disease) ([place]) ?Bilateral gangrene toes, no signs of local bacterial infection. ?Plan for conservative care.  ? ? ? ? ?Subjective: Patient is more awake and reactive today, he has been tolerating slow rate of tube feedings  ? ?Physical Exam: ?Vitals:  ? 05/20/21 2000 05/20/21 2300 05/21/21 0400 05/21/21 0800  ?BP: (!) 126/52 (!) 117/55 (!) 114/54 (!) 125/57  ?Pulse: 69 69 70 70  ?Resp: (!) '24 18 19 18  '$ ?Temp: 98.2 ?F (36.8 ?C) 98 ?F (36.7 ?  C) (!) 97.4 ?F (36.3 ?C) 98 ?F (36.7 ?C)  ?TempSrc: Oral Oral Axillary Oral  ?SpO2: 96% 97% 95% 97%  ?Weight:      ?Height:      ? ?Neurology more awake and alert ?ENT with pallor but no icterus ?Cardiovascular with S1 and S2 present and rhythmic with no gallops or rubs ?No JVD ?No lower extremity  edema ?Respiratory with decreased breaths sounds bilaterally and bilateral rales with no wheezing ?Abdomen soft and not distended  ?Data Reviewed: ? ? ? ?Family Communication: I spoke with patient's family at the bedside, we talked in detail about patient's condition, plan of care and prognosis and all questions were addressed. ? ? ?Disposition: ?Status is: Inpatient ?Remains inpatient appropriate because: heart failure  ? Planned Discharge Destination: Home ? ?Author: ?Tawni Millers, MD ?05/21/2021 10:43 AM ? ?For on call review [email or web address].  ?

## 2021-05-21 NOTE — Progress Notes (Addendum)
Brief Nutrition Note ? ?RD received page requesting titration orders for pt's TF. Note pt had been switched to Jevity @ 42m/hr when RD yesterday placed order for continuation of pt's home TF regimen (please see full RD assessment from 05/20/21). Will discontinue Jevity and place order as described in RD assessment yesterday. ? ?Continue bolus feedings via PEG: ?-315 ml Nepro TID ?-300 ml free water flush 4 times daily ?  ?Tube feeding regimen provides 1701 kcal (100% of needs), 77 grams of protein, and 687 ml of H2O. Total free water: 1887 ml daily ?  ?------------------------------------------- ?Addendum 05/21/21 @ 1045: ? ?RD received consult from MD requesting transition to continuous TF. Will transition to the following via PEG: ?-Nepro @ 411mhr (96050may)  ?-5m56mosource TF daily ? ?Provides 1768 kcals, 88 grams protein, 697 ml free water ? ? ? ?AmanTheone StanleyS, RD, LDN (she/her/hers) ?RD pager number and weekend/on-call pager number located in [place]?

## 2021-05-22 DIAGNOSIS — N179 Acute kidney failure, unspecified: Secondary | ICD-10-CM | POA: Diagnosis not present

## 2021-05-22 DIAGNOSIS — N189 Chronic kidney disease, unspecified: Secondary | ICD-10-CM | POA: Diagnosis present

## 2021-05-22 DIAGNOSIS — I10 Essential (primary) hypertension: Secondary | ICD-10-CM | POA: Diagnosis not present

## 2021-05-22 DIAGNOSIS — I480 Paroxysmal atrial fibrillation: Secondary | ICD-10-CM | POA: Diagnosis not present

## 2021-05-22 DIAGNOSIS — I5033 Acute on chronic diastolic (congestive) heart failure: Secondary | ICD-10-CM | POA: Diagnosis not present

## 2021-05-22 LAB — IRON AND TIBC
Iron: 45 ug/dL (ref 45–182)
Saturation Ratios: 16 % — ABNORMAL LOW (ref 17.9–39.5)
TIBC: 283 ug/dL (ref 250–450)
UIBC: 238 ug/dL

## 2021-05-22 LAB — CBC
HCT: 26.1 % — ABNORMAL LOW (ref 39.0–52.0)
Hemoglobin: 8.3 g/dL — ABNORMAL LOW (ref 13.0–17.0)
MCH: 31 pg (ref 26.0–34.0)
MCHC: 31.8 g/dL (ref 30.0–36.0)
MCV: 97.4 fL (ref 80.0–100.0)
Platelets: 172 10*3/uL (ref 150–400)
RBC: 2.68 MIL/uL — ABNORMAL LOW (ref 4.22–5.81)
RDW: 16.1 % — ABNORMAL HIGH (ref 11.5–15.5)
WBC: 7.4 10*3/uL (ref 4.0–10.5)
nRBC: 0 % (ref 0.0–0.2)

## 2021-05-22 LAB — TRANSFERRIN: Transferrin: 202 mg/dL (ref 180–329)

## 2021-05-22 LAB — MAGNESIUM: Magnesium: 2.2 mg/dL (ref 1.7–2.4)

## 2021-05-22 LAB — FERRITIN: Ferritin: 506 ng/mL — ABNORMAL HIGH (ref 24–336)

## 2021-05-22 LAB — BASIC METABOLIC PANEL
Anion gap: 8 (ref 5–15)
BUN: 47 mg/dL — ABNORMAL HIGH (ref 8–23)
CO2: 29 mmol/L (ref 22–32)
Calcium: 9.4 mg/dL (ref 8.9–10.3)
Chloride: 99 mmol/L (ref 98–111)
Creatinine, Ser: 1.42 mg/dL — ABNORMAL HIGH (ref 0.61–1.24)
GFR, Estimated: 45 mL/min — ABNORMAL LOW (ref >60)
Glucose, Bld: 102 mg/dL — ABNORMAL HIGH (ref 70–99)
Potassium: 4.8 mmol/L (ref 3.5–5.1)
Sodium: 136 mmol/L (ref 135–145)

## 2021-05-22 MED ORDER — FUROSEMIDE 10 MG/ML IJ SOLN: 40.0000 mg | Freq: Every day | INTRAMUSCULAR | Status: DC

## 2021-05-22 MED ORDER — NA FERRIC GLUC CPLX IN SUCROSE 12.5 MG/ML IV SOLN
250.0000 mg | Freq: Every day | INTRAVENOUS | Status: AC
  Administered 2021-05-22: 250 mg via INTRAVENOUS
  Filled 2021-05-22: qty 20

## 2021-05-22 NOTE — Plan of Care (Signed)

## 2021-05-22 NOTE — Evaluation (Signed)
Occupational Therapy Evaluation ?Patient Details ?Name: Gregory [name] ?MRN: [id number] ?DOB: [birth year] ?Today's Date: 05/22/2021 ? ? ?History of Present Illness Patient is a 88 yo male presenting to the ED with SOB and cough on 05/19/21. Admitted with decompensated heart failure, cardiogenic pulmonary edema and bilateral pleural effusions. . Patient with two prior admissions in 1/23 and 2/23. PMH includes: aortic stenosis, HTN, HLD, paroxysmal atrial flutter and heart block with pacemaker in place not on anticoagulation, dysphagia with G-tube in place. gangrene on multiple toes, COVID+  ? ?Clinical Impression ?  ?Prior to this admission, patient was living at home with his son, with personal care attendants from [employer] assisting patient 24/7. Currently, patient presenting with decreased activity tolerance, min-mod A for ADLs, and min-mod A for transfers. Patient able to demonstrate ambulation of household distances with close chair follow, with poor eccentric control to come into sitting. Patient able to wean to 1L of oxygen at end of session (patient does not wear O2 at home) and did not need O2 to complete ambulation. OT recommending [facility] services at discharge due to decreased endurance and activity tolerance. OT will continue to follow acutely to address deficits listed below.  ?   ? ?Recommendations for follow up therapy are one component of a multi-disciplinary discharge planning process, led by the attending physician.  Recommendations may be updated based on patient status, additional functional criteria and insurance authorization.  ? ?Follow Up Recommendations ? Home health OT  ?  ?Assistance Recommended at Discharge Frequent or constant Supervision/Assistance  ?Patient can return home with the following A little help with walking and/or transfers;A lot of help with bathing/dressing/bathroom;Assistance with cooking/housework;Direct supervision/assist for medications management;Direct supervision/assist  for financial management;Assist for transportation;Help with stairs or ramp for entrance ? ?  ?Functional Status Assessment ? Patient has had a recent decline in their functional status and demonstrates the ability to make significant improvements in function in a reasonable and predictable amount of time.  ?Equipment Recommendations ? None recommended by OT (Patient has equipment needed)  ?  ?Recommendations for Other Services   ? ? ?  ?Precautions / Restrictions Precautions ?Precautions: Fall ?Precaution Comments: Has permanent G tube ?Restrictions ?Weight Bearing Restrictions: No  ? ?  ? ?Mobility Bed Mobility ?Overal bed mobility: Needs Assistance ?Bed Mobility: Supine to Sit ?  ?  ?Supine to sit: Mod assist ?  ?  ?General bed mobility comments: Assist at trunk to come into sitting, increased use of bed rails, step by step instructions needed for motor planning ?  ? ?Transfers ?Overall transfer level: Needs assistance ?Equipment used: Rolling walker (2 wheels) ?Transfers: Sit to/from Stand ?Sit to Stand: Min assist ?  ?  ?  ?  ?  ?General transfer comment: min A needed to power up, minimally elevated surface (bed) good hand placement throughout, poor eccentric control to sit ?  ? ?  ?Balance Overall balance assessment: Needs assistance ?Sitting-balance support: Feet supported, Bilateral upper extremity supported, Single extremity supported ?Sitting balance-Leahy Scale: Fair ?  ?  ?Standing balance support: Bilateral upper extremity supported, During functional activity, Reliant on assistive device for balance ?Standing balance-Leahy Scale: Poor ?Standing balance comment: reliant on RW ?  ?  ?  ?  ?  ?  ?  ?  ?  ?  ?  ?   ? ?ADL either performed or assessed with clinical judgement  ? ?ADL Overall ADL's : Needs assistance/impaired ?Eating/Feeding: NPO ?Eating/Feeding Details (indicate cue type and reason): Has permanant G tube,  does not take anything in by mouth ?Grooming: Set up;Sitting ?  ?Upper Body Bathing:  Set up;Sitting ?  ?Lower Body Bathing: Moderate assistance;Sitting/lateral leans;Sit to/from stand ?  ?Upper Body Dressing : Sitting;Set up ?  ?Lower Body Dressing: Moderate assistance;Sitting/lateral leans;Sit to/from stand ?  ?Toilet Transfer: Moderate assistance;Rolling walker (2 wheels);Cueing for sequencing;Cueing for safety ?  ?  ?  ?  ?  ?Functional mobility during ADLs: Minimal assistance;Moderate assistance;Cueing for safety;Cueing for sequencing;Rolling walker (2 wheels) ?General ADL Comments: Patient presenting with minimal decreased activity tolerance, patient is near baseline  ? ? ? ?Vision Baseline Vision/History: 1 Wears glasses ?Ability to See in Adequate Light: 0 Adequate ?Patient Visual Report: No change from baseline ?   ?   ?Perception   ?  ?Praxis   ?  ? ?Pertinent Vitals/Pain Pain Assessment ?Pain Assessment: No/denies pain  ? ? ? ?Hand Dominance Right ?  ?Extremity/Trunk Assessment Upper Extremity Assessment ?Upper Extremity Assessment: Generalized weakness ?  ?Lower Extremity Assessment ?Lower Extremity Assessment: Defer to PT evaluation ?  ?Cervical / Trunk Assessment ?Cervical / Trunk Assessment: Kyphotic ?  ?Communication Communication ?Communication: HOH ?  ?Cognition Arousal/Alertness: Awake/alert ?Behavior During Therapy: [facility] for tasks assessed/performed ?Overall Cognitive Status: Within Functional Limits for tasks assessed ?  ?  ?  ?  ?  ?  ?  ?  ?  ?  ?  ?  ?  ?  ?  ?  ?General Comments: Quick witted, son present for evalution, says it is his father's baseline ?  ?  ?General Comments    ? ?  ?Exercises   ?  ?Shoulder Instructions    ? ? ?Home Living Family/patient expects to be discharged to:: Private residence ?Living Arrangements: Children ?Available Help at Discharge: Family;Available 24 hours/day;Personal care attendant ?Type of Home: House ?Home Access: Stairs to enter ?Entrance Stairs-Number of Steps: 2 ?Entrance Stairs-Rails: None ?Home Layout: Two level;Able to live on main  level with bedroom/bathroom ?  ?Alternate Level Stairs-Rails: Right ?Bathroom Shower/Tub: Walk-in shower ?  ?Bathroom Toilet: Handicapped height ?Bathroom Accessibility: Yes ?  ?Home Equipment: Cane - single point;Grab bars - tub/shower;Grab bars - toilet;Hand held shower head;Shower seat ?  ?Additional Comments:  (Has personal care attendant through [employer] that work 24/7) ?  ? ?  ?Prior Functioning/Environment Prior Level of Function : Needs assist ?  ?  ?  ?  ?  ?  ?Mobility Comments: Aide stays with him at night and during the day ?ADLs Comments: Does bird bathing on first floor; but handicapped accessible bathroom is upstairs ?  ? ?  ?  ?OT Problem List: Decreased strength;Decreased activity tolerance;Impaired balance (sitting and/or standing);Decreased coordination;Decreased safety awareness ?  ?   ?OT Treatment/Interventions: Self-care/ADL training;Therapeutic exercise;Energy conservation;DME and/or AE instruction;Manual therapy;Therapeutic activities;Patient/family education;Balance training  ?  ?OT Goals(Current goals can be found in the care plan section) Acute Rehab OT Goals ?Patient Stated Goal: to go home ?OT Goal Formulation: With patient ?Time For Goal Achievement: 06/05/21 ?Potential to Achieve Goals: Good ?ADL Goals ?Pt Will Perform Grooming: standing;with supervision ?Pt Will Perform Lower Body Bathing: sitting/lateral leans;sit to/from stand;with min assist ?Pt Will Perform Lower Body Dressing: with min assist;sitting/lateral leans;sit to/from stand ?Pt Will Transfer to Toilet: with supervision;ambulating  ?OT Frequency: Min 2X/week ?  ? ?Co-evaluation   ?  ?  ?  ?  ? ?  ?AM-PAC OT "6 Clicks" Daily Activity     ?Outcome Measure Help from another person eating meals?: Total ?Help  from another person taking care of personal grooming?: A Little ?Help from another person toileting, which includes using toliet, bedpan, or urinal?: A Lot ?Help from another person bathing (including washing,  rinsing, drying)?: A Lot ?Help from another person to put on and taking off regular upper body clothing?: A Little ?Help from another person to put on and taking off regular lower body clothing?: A Lot ?6 Click S

## 2021-05-22 NOTE — Assessment & Plan Note (Addendum)
Hyponatremia.  ? ?Patient responded well to diuresis, his peak cr reached 1,62, at the time of discharge is 1,52. His volume status has improved. ? ?Continue diuresis as needed at home and follow up renal function as outpatient.  ? ?At the time of his discharge his serum cr is 1,52 with K at 3.8 and serum bicarbonate 30. Na 131.  ?  ?

## 2021-05-22 NOTE — Plan of Care (Signed)

## 2021-05-22 NOTE — Progress Notes (Signed)
?Progress Note ? ? ?Patient: Gregory [name] WCB:[id number] DOB: 1920-03-01 DOA: 05/19/2021     3 ?DOS: the patient was seen and examined on 05/22/2021 ?  ?Brief hospital course: ?Gregory [name] was admitted to the hospital with the working diagnosis of decompensated heart failure, cardiogenic pulmonary edema and bilateral pleural effusions.  ? ?88 yo male with the past medical history of paroxysmal atrial fibrillation, pulmonary hypertension, peripheral vascular disease with bilateral toe gangrene, dysphagia sp peg tube and aortic stenosis sp TAVR.  ?Reported worsening dyspnea for 2 weeks, associated with productive cough. As outpatient he was treated with oral antibiotics with no improvement in his symptoms. On his initial physical examination his blood pressure was 121/66, HR 69, RR 20, 02 saturation 97%. Lungs with bilateral rales and increased work of breathing, heart with S1 and S2 present and rhythmic, abdomen not distended, no lower extremity edema, several gangrene like changes on bilateral toes.  ? ?Na 137, K 4,7 Cl 106, bicarbonate 26, glucose 122, bun 48 cr 1,37 ?BNP 343  ?High sensitive troponin 24 and 24  ?Wbc 8,9 hgb 7,9 plt 190  ?SARS covid 19 positive (positive in 03/2021)  ? ?Chest radiograph with bilateral interstitial infiltrates with bilateral hilar congestion and bilateral moderate pleural effusions.  ? ?EKG 76 bpm, left axis deviation, ventricular paced rhythm, with no significant ST segment or T wave changes.  ? ?Patient had one unit PRBC and was placed on furosemide for diuresis.  ?Patient placed on furosemide for diuresis and resume tube feedings.  ?Slowly improving.  ? ?Assessment and Plan: ?* Paroxysmal atrial fibrillation ([place]) ?Ventricular paced rhythm. ? ?Heart rate 60 range.  ?Patient not on anticoagulation due to history of gastrointestinal bleeding.  ?Continue telemetry monitoring.  ?Not on AV blocking agents.  ? ?CHF (congestive heart failure), NYHA class IV ([place]) ?Echocardiogram with LV  EF 55%, with no wall motion abnormalities, RV systolic function preserved, moderate TR, no aortic stenosis (SP TAVR).  ? ?Documented urine output is 2,500 ml.  ?Blood pressure 109 to 120 mmHg. ? ?Plan to continue diuresis with furosemide 40 mg Iv q24 hrs ?Follow up chest film in am, to evaluate pulmonary edema.  ?May need thoracentesis.  ? ?Anemia ?SP 1 unit PRBC transfusion. ? ?Follow up hgb is 8,3 and plt at 172. ?Iron panel with serum iron 45, transferrin saturation 16, transferrin 202 and ferritin 506, consistent with anemia of chronic disease combined with iron deficiency.  ? ?Add one dose of IV iron, high ferritin likely acute phase reactant. ?Patient had recent covid 19 infection. ? ?Acute kidney injury superimposed on chronic kidney disease ([place]) ?Renal function with serum cr at 1.42 with K at 4,8 and serum bicarbonate at 29. ?Plan to continue diuresis with furosemide and follow up renal function in am. ?Avoid hypotension or nephrotoxic medications.  ? ?Hypertension ?Blood pressure has been stable 112 to 616 mmHg systolic. ?Continue diuresis with furosemide  ? ? ? ? ?Protein calorie malnutrition ([place]) ?Severe calorie protein malnutrition.  ?Patient on tube feedings. ?Consulted nutrition for recommendations.  ?Plan for continuous feedings for now.  ? ? ?Dysphagia s/p G tube  ?Tolerating well tube feedings.  ? ?Deafness ?Patient high risk for hospital delirium.  ?Patient on quetiapine.  ?Patient with improved mentation, more reactive.  ?No encephalopathy.  ? ? ?Dyslipidemia ?Continue with statin therapy.  ? ?Thyroid disease ?Continue with levothyroxine ? ? ?History of COVID-19 ?Positive COVID 19 on 2/17, continue positive test after 7 weeks. ?No clinical signs of active viral infection. ?  Discontinue respiratory isolation.  ?PT and OT ?Patient very weak and deconditioned.  ? ?PVD (peripheral vascular disease) ([place]) ?Bilateral gangrene toes, no signs of local bacterial infection. ?Plan for conservative care.   ? ? ? ? ?Subjective: patient more reactive, no chest pain or dyspnea, tolerating tube feedings.  ? ?Physical Exam: ?Vitals:  ? 05/21/21 2306 05/22/21 0500 05/22/21 0737 05/22/21 0800  ?BP: (!) 103/43  (!) 108/45 (!) 106/48  ?Pulse: 70  69 69  ?Resp: '20  20 17  '$ ?Temp: 97.9 ?F (36.6 ?C)     ?TempSrc: Oral     ?SpO2: 96%  97% 98%  ?Weight:  79.1 kg    ?Height:      ? ?Neurology awake and alert ?ENT with pallor but no icterus ?Cardiovascular with S1 and S2 present , regular with no gallops or rubs ?No JVD ?No lower extremity edema ?Respiratory with bibasilar rales and decreased breath sounds at bases, no wheezing ?Abdomen soft and not distended.  ? ?Data Reviewed: ? ? ? ?Family Communication: I spoke with Gregory [name] at the bedside, we talked in detail about Gregory condition, plan of care and prognosis and all questions were addressed. ? ? ?Disposition: ?Status is: Inpatient ?Remains inpatient appropriate because: heart failure  ? Planned Discharge Destination: Home ? ?Author: ?Tawni Millers, MD ?05/22/2021 9:47 AM ? ?For on call review [email or web address].  ?

## 2021-05-22 NOTE — Consult Note (Signed)
WOC Nurse Consult Note: ?Reason for Consult: Known areas of eschar, dry gangrene, to feet. Seen and followed by Vascular Surgeon, Dr. Scot Dock in the community. Last seen by that provider on 04/27/21. Also noted are boggy, but intact heels bilaterally and sacrum with blanchable erythema. ?Wound type: PAD ?Pressure Injury POA: N/A ?Measurement: (Per Bedside RN on 05/19/21) ?RGT: 1cm x 2cm eschar ?LGT: 2cm x 3cm eschar ?Right lateral foot: Black/brown discoloration over callus measuring 3cm x 5cm  ?Left lateral foot: Black/brown discoloration over callus measuring 2cm x 1.5cm  ?Wound bed:As noted above ?Drainage (amount, consistency, odor) None ?Periwound: intact ?Dressing procedure/placement/frequency: I will provide the patient with a mattress replacement with low air loss feature. A silicone foam dressing is to be placed over the sacrum and turning and repositioning is in place. I have added guidance to minimize time in the supine position. The wounds noted above are to be painted twice daily with povidone iodine solution and when dry, covered with dry gauze. Feet are to be placed into Prevalon pressure redistribution heel boots.  ? ?If further input for wound care is desired, recommend consulting Vascular Surgery (Dr. Scot Dock).  ? ?[place] nursing team will not follow, but will remain available to this patient, the nursing and medical teams.  Please re-consult if needed. ?Thanks, ?Maudie Flakes, MSN, RN, [place], [place], CWON-AP, [place]  ?Pager# [phone]  ? ? ? ?  ?

## 2021-05-22 NOTE — Evaluation (Signed)
Physical Therapy Evaluation ?Patient Details ?Name: Gregory [name] ?MRN: [id number] ?DOB: 09-25-1920 ?Today's Date: 05/22/2021 ? ?History of Present Illness ? Patient is a 88 yo male presenting to the ED with SOB and cough on 05/19/21. Admitted with decompensated heart failure, cardiogenic pulmonary edema and bilateral pleural effusions. . Patient with two prior admissions in 1/23 and 2/23. PMH includes: aortic stenosis, HTN, HLD, paroxysmal atrial flutter and heart block with pacemaker in place not on anticoagulation, dysphagia with G-tube in place. gangrene on multiple toes, COVID+  ?Clinical Impression ? Patient presents with generalized weakness, impaired balance, decreased activity tolerance and impaired mobility s/p above. Pt lives at home with son and has 24/7 caregivers; uses RW for ambulation and needs assist for ADLs at baseline. Today, pt requires Mod A for bed mobility, Min A for transfers and Min guard assist for ambulation with use of RW for support as well as a chair follow. Sp02 remained >90% on RA throughout. Will follow acutely to maximize independence and mobility prior to return home. ?   ? ?Recommendations for follow up therapy are one component of a multi-disciplinary discharge planning process, led by the attending physician.  Recommendations may be updated based on patient status, additional functional criteria and insurance authorization. ? ?Follow Up Recommendations Home health PT ? ?  ?Assistance Recommended at Discharge Frequent or constant Supervision/Assistance  ?Patient can return home with the following ? A little help with walking and/or transfers;A little help with bathing/dressing/bathroom;Assistance with cooking/housework;Assist for transportation;Help with stairs or ramp for entrance ? ?  ?Equipment Recommendations None recommended by PT  ?Recommendations for Other Services ?    ?  ?Functional Status Assessment Patient has had a recent decline in their functional status and  demonstrates the ability to make significant improvements in function in a reasonable and predictable amount of time.  ? ?  ?Precautions / Restrictions Precautions ?Precautions: Fall;Other (comment) ?Precaution Comments: Has permanent G tube ?Restrictions ?Weight Bearing Restrictions: No  ? ?  ? ?Mobility ? Bed Mobility ?Overal bed mobility: Needs Assistance ?Bed Mobility: Supine to Sit ?  ?  ?Supine to sit: Mod assist, HOB elevated ?  ?  ?General bed mobility comments: Assist at trunk to come into sitting, increased use of bed rails, step by step instructions needed for motor planning ?  ? ?Transfers ?Overall transfer level: Needs assistance ?Equipment used: Rolling walker (2 wheels) ?Transfers: Sit to/from Stand ?Sit to Stand: Min assist ?  ?  ?  ?  ?  ?General transfer comment: min A needed to power up, minimally elevated surface (bed) good hand placement throughout, poor eccentric control to sit. Transferred to chair post ambulation. ?  ? ?Ambulation/Gait ?Ambulation/Gait assistance: Min guard ?Gait Distance (Feet): 90 Feet ?Assistive device: Rolling walker (2 wheels) ?Gait Pattern/deviations: Step-through pattern, Decreased stride length, Trunk flexed, Narrow base of support ?Gait velocity: decreased ?Gait velocity interpretation: <1.31 ft/sec, indicative of household ambulator ?  ?General Gait Details: Slow, mildly unsteady gait with flexed trunk and narrow BoS. Decreased step lengths and clearance. SP02 remained >90% on RA. Chair follow helpful. ? ?Stairs ?  ?  ?  ?  ?  ? ?Wheelchair Mobility ?  ? ?Modified Rankin (Stroke Patients Only) ?  ? ?  ? ?Balance Overall balance assessment: Needs assistance ?Sitting-balance support: Feet supported, No upper extremity supported ?Sitting balance-Leahy Scale: Fair ?  ?  ?Standing balance support: During functional activity, Reliant on assistive device for balance ?Standing balance-Leahy Scale: Poor ?Standing balance comment: reliant on RW ?  ?  ?  ?  ?  ?  ?  ?  ?  ?  ?   ?   ? ? ? ?  Pertinent Vitals/Pain Pain Assessment ?Pain Assessment: No/denies pain  ? ? ?Home Living Family/patient expects to be discharged to:: Private residence ?Living Arrangements: Children ?Available Help at Discharge: Family;Available 24 hours/day;Personal care attendant ?Type of Home: House ?Home Access: Stairs to enter ?Entrance Stairs-Rails: None ?Entrance Stairs-Number of Steps: 2 ?  ?Home Layout: Two level;Able to live on main level with bedroom/bathroom ?Home Equipment: Cane - single point;Grab bars - tub/shower;Grab bars - toilet;Hand held shower head;Shower Land (2 wheels) ?Additional Comments: Has PCA through [facility] that works 24/7  ?  ?Prior Function Prior Level of Function : Needs assist ?  ?  ?  ?  ?  ?  ?Mobility Comments: Aide stays with him at night and during the day, uses RW for ambulation ?ADLs Comments: Does bird bathing on first floor; but handicapped accessible bathroom is upstairs ?  ? ? ?Hand Dominance  ? Dominant Hand: Right ? ?  ?Extremity/Trunk Assessment  ? Upper Extremity Assessment ?Upper Extremity Assessment: Defer to OT evaluation ?  ? ?Lower Extremity Assessment ?Lower Extremity Assessment: Generalized weakness (multiple necrotic areas on bil toes/feet) ?  ? ?Cervical / Trunk Assessment ?Cervical / Trunk Assessment: Kyphotic  ?Communication  ? Communication: HOH  ?Cognition Arousal/Alertness: Awake/alert ?Behavior During Therapy: [facility] for tasks assessed/performed ?Overall Cognitive Status: Within Functional Limits for tasks assessed ?  ?  ?  ?  ?  ?  ?  ?  ?  ?  ?  ?  ?  ?  ?  ?  ?General Comments: Quick witted, son present for evalution, says it is his father's baseline, HOH so need repetition at times. ?  ?  ? ?  ?General Comments General comments (skin integrity, edema, etc.): Son present during session. SP02 remained >90% on RA. ? ?  ?Exercises    ? ?Assessment/Plan  ?  ?PT Assessment Patient needs continued PT services  ?PT Problem List Decreased  mobility;Decreased strength;Decreased balance;Decreased activity tolerance;Decreased skin integrity ? ?   ?  ?PT Treatment Interventions Therapeutic exercise;Gait training;Patient/family education;Therapeutic activities;Functional mobility training;Balance training;Stair training;DME instruction   ? ?PT Goals (Current goals can be found in the Care Plan section)  ?Acute Rehab PT Goals ?Patient Stated Goal: to go home ?PT Goal Formulation: With patient ?Time For Goal Achievement: 06/05/21 ?Potential to Achieve Goals: Fair ? ?  ?Frequency Min 3X/week ?  ? ? ?Co-evaluation   ?  ?  ?  ?  ? ? ?  ?AM-PAC PT "6 Clicks" Mobility  ?Outcome Measure Help needed turning from your back to your side while in a flat bed without using bedrails?: A Little ?Help needed moving from lying on your back to sitting on the side of a flat bed without using bedrails?: A Lot ?Help needed moving to and from a bed to a chair (including a wheelchair)?: A Little ?Help needed standing up from a chair using your arms (e.g., wheelchair or bedside chair)?: A Little ?Help needed to walk in hospital room?: A Little ?Help needed climbing 3-5 steps with a railing? : A Lot ?6 Click Score: 16 ? ?  ?End of Session Equipment Utilized During Treatment: Gait belt ?Activity Tolerance: Patient tolerated treatment well ?Patient left: in chair;with call bell/phone within reach;with family/visitor present ?Nurse Communication: Mobility status ?PT Visit Diagnosis: Muscle weakness (generalized) (M62.81);Difficulty in walking, not elsewhere classified (R26.2) ?  ? ?Time: 9811-9147 ?PT Time Calculation (min) (ACUTE ONLY): 30 min ? ? ?Charges:   PT Evaluation ?$PT Eval Moderate Complexity: 1 Mod ?  ?  ?   ? ? ?  Marisa Severin, PT, DPT ?Acute Rehabilitation Services ?Secure chat preferred ?Office [phone] ? ? ? ? ?[place] ?05/22/2021, 11:58 AM ? ?

## 2021-05-22 NOTE — Progress Notes (Signed)
Heart Failure Navigator Progress Note ? ?Assessed for Heart & Vascular TOC clinic readiness.  ?Patient does not meet criteria due to No benefit at this time.  ? ?Navigator available for reassessment of patient.  ? ?Earnestine Leys, BSN, RN ?Heart Failure Nurse Navigator ?Secure Chat Only   ?

## 2021-05-22 NOTE — Care Management Important Message (Signed)
Important Message ? ?Patient Details  ?Name: Gregory [name] ?MRN: [id number] ?Date of Birth: 1920-11-30 ? ? ?Medicare Important Message Given:  Yes ? ? ? ? ?[name] ?05/22/2021, 3:40 PM ?

## 2021-05-23 ENCOUNTER — Inpatient Hospital Stay (HOSPITAL_COMMUNITY): Payer: Medicare Other

## 2021-05-23 DIAGNOSIS — I480 Paroxysmal atrial fibrillation: Secondary | ICD-10-CM | POA: Diagnosis not present

## 2021-05-23 DIAGNOSIS — I5033 Acute on chronic diastolic (congestive) heart failure: Secondary | ICD-10-CM | POA: Diagnosis not present

## 2021-05-23 DIAGNOSIS — I10 Essential (primary) hypertension: Secondary | ICD-10-CM | POA: Diagnosis not present

## 2021-05-23 DIAGNOSIS — N179 Acute kidney failure, unspecified: Secondary | ICD-10-CM | POA: Diagnosis not present

## 2021-05-23 LAB — BASIC METABOLIC PANEL
Anion gap: 8 (ref 5–15)
BUN: 59 mg/dL — ABNORMAL HIGH (ref 8–23)
CO2: 31 mmol/L (ref 22–32)
Calcium: 9.5 mg/dL (ref 8.9–10.3)
Chloride: 98 mmol/L (ref 98–111)
Creatinine, Ser: 1.62 mg/dL — ABNORMAL HIGH (ref 0.61–1.24)
GFR, Estimated: 38 mL/min — ABNORMAL LOW (ref >60)
Glucose, Bld: 102 mg/dL — ABNORMAL HIGH (ref 70–99)
Potassium: 4.4 mmol/L (ref 3.5–5.1)
Sodium: 137 mmol/L (ref 135–145)

## 2021-05-23 LAB — CBC
HCT: 25.8 % — ABNORMAL LOW (ref 39.0–52.0)
Hemoglobin: 8.1 g/dL — ABNORMAL LOW (ref 13.0–17.0)
MCH: 30.7 pg (ref 26.0–34.0)
MCHC: 31.4 g/dL (ref 30.0–36.0)
MCV: 97.7 fL (ref 80.0–100.0)
Platelets: 172 10*3/uL (ref 150–400)
RBC: 2.64 MIL/uL — ABNORMAL LOW (ref 4.22–5.81)
RDW: 16 % — ABNORMAL HIGH (ref 11.5–15.5)
WBC: 7.4 10*3/uL (ref 4.0–10.5)
nRBC: 0 % (ref 0.0–0.2)

## 2021-05-23 MED ORDER — FREE WATER
300.0000 mL | Freq: Three times a day (TID) | Status: DC
  Administered 2021-05-23 – 2021-05-26 (×9): 300 mL

## 2021-05-23 NOTE — Progress Notes (Signed)
Physical Therapy Treatment ?Patient Details ?Name: Gregory [name] ?MRN: [id number] ?DOB: 05-Sep-1920 ?Today's Date: 05/23/2021 ? ? ?History of Present Illness Patient is a 88 yo male presenting to the ED with SOB and cough on 05/19/21. Admitted with decompensated heart failure, cardiogenic pulmonary edema and bilateral pleural effusions. . Patient with two prior admissions in 1/23 and 2/23. PMH includes: aortic stenosis, HTN, HLD, paroxysmal atrial flutter and heart block with pacemaker in place not on anticoagulation, dysphagia with G-tube in place. gangrene on multiple toes, COVID+ ? ?  ?PT Comments  ? ? Pt agreeable to participate.  Pt requiring min-modA for bed mobility and transfers. Pt performing warm up exercise and ambulating 80 ft with a walker at a min guard assist level. SpO2 95% on RA. D/c plan remains appropriate.  ?  ?Recommendations for follow up therapy are one component of a multi-disciplinary discharge planning process, led by the attending physician.  Recommendations may be updated based on patient status, additional functional criteria and insurance authorization. ? ?Follow Up Recommendations ? Home health PT ?  ?  ?Assistance Recommended at Discharge Frequent or constant Supervision/Assistance  ?Patient can return home with the following A little help with walking and/or transfers;A little help with bathing/dressing/bathroom;Assistance with cooking/housework;Assist for transportation;Help with stairs or ramp for entrance ?  ?Equipment Recommendations ? None recommended by PT  ?  ?Recommendations for Other Services   ? ? ?  ?Precautions / Restrictions Precautions ?Precautions: Fall;Other (comment) ?Precaution Comments: G tube ?Restrictions ?Weight Bearing Restrictions: No  ?  ? ?Mobility ? Bed Mobility ?Overal bed mobility: Needs Assistance ?Bed Mobility: Supine to Sit ?  ?  ?Supine to sit: Mod assist ?  ?  ?General bed mobility comments: Assist at trunk to come up to sitting, use of bed pad to scoot  hips forward ?  ? ?Transfers ?Overall transfer level: Needs assistance ?Equipment used: Rolling walker (2 wheels) ?Transfers: Sit to/from Stand ?Sit to Stand: Min assist ?  ?  ?  ?  ?  ?General transfer comment: MinA to power up ?  ? ?Ambulation/Gait ?Ambulation/Gait assistance: Min guard ?Gait Distance (Feet): 80 Feet ?Assistive device: Rolling walker (2 wheels) ?Gait Pattern/deviations: Step-through pattern, Decreased stride length, Trunk flexed, Narrow base of support ?Gait velocity: decreased ?Gait velocity interpretation: <1.8 ft/sec, indicate of risk for recurrent falls ?  ?General Gait Details: Slow, mildly unsteady pace, increased trunk flexion, min guard for safety ? ? ?Stairs ?  ?  ?  ?  ?  ? ? ?Wheelchair Mobility ?  ? ?Modified Rankin (Stroke Patients Only) ?  ? ? ?  ?Balance Overall balance assessment: Needs assistance ?Sitting-balance support: Feet supported, No upper extremity supported ?Sitting balance-Leahy Scale: Fair ?  ?  ?Standing balance support: During functional activity, Reliant on assistive device for balance ?Standing balance-Leahy Scale: Poor ?Standing balance comment: reliant on RW ?  ?  ?  ?  ?  ?  ?  ?  ?  ?  ?  ?  ? ?  ?Cognition Arousal/Alertness: Awake/alert ?Behavior During Therapy: [facility] for tasks assessed/performed ?Overall Cognitive Status: Within Functional Limits for tasks assessed ?  ?  ?  ?  ?  ?  ?  ?  ?  ?  ?  ?  ?  ?  ?  ?  ?  ?  ?  ? ?  ?Exercises General Exercises - Lower Extremity ?Heel Slides: Both, 10 reps, Supine ?Straight Leg Raises: Both, 5 reps, Supine ? ?  ?General Comments   ?  ?  ? ?  Pertinent Vitals/Pain Pain Assessment ?Pain Assessment: Faces ?Faces Pain Scale: Hurts even more ?Pain Location: bilateral heels ?Pain Descriptors / Indicators: Discomfort ?Pain Intervention(s): Monitored during session  ? ? ?Home Living   ?  ?  ?  ?  ?  ?  ?  ?  ?  ?   ?  ?Prior Function    ?  ?  ?   ? ?PT Goals (current goals can now be found in the care plan section) Acute Rehab  PT Goals ?Patient Stated Goal: to go home ?Potential to Achieve Goals: Fair ?Progress towards PT goals: Progressing toward goals ? ?  ?Frequency ? ? ? Min 3X/week ? ? ? ?  ?PT Plan Current plan remains appropriate  ? ? ?Co-evaluation   ?  ?  ?  ?  ? ?  ?AM-PAC PT "6 Clicks" Mobility   ?Outcome Measure ? Help needed turning from your back to your side while in a flat bed without using bedrails?: A Little ?Help needed moving from lying on your back to sitting on the side of a flat bed without using bedrails?: A Lot ?Help needed moving to and from a bed to a chair (including a wheelchair)?: A Little ?Help needed standing up from a chair using your arms (e.g., wheelchair or bedside chair)?: A Little ?Help needed to walk in hospital room?: A Little ?Help needed climbing 3-5 steps with a railing? : A Lot ?6 Click Score: 16 ? ?  ?End of Session Equipment Utilized During Treatment: Gait belt ?Activity Tolerance: Patient tolerated treatment well ?Patient left: in chair;with call bell/phone within reach;with family/visitor present ?Nurse Communication: Mobility status ?PT Visit Diagnosis: Muscle weakness (generalized) (M62.81);Difficulty in walking, not elsewhere classified (R26.2) ?  ? ? ?Time: 4332-9518 ?PT Time Calculation (min) (ACUTE ONLY): 41 min ? ?Charges:  $Therapeutic Activity: 38-52 mins          ?          ? ?Wyona Almas, PT, DPT ?Acute Rehabilitation Services ?Pager [phone] ?Office [phone] ? ? ? ?Carloine Margo Aye ?05/23/2021, 4:56 PM ? ?

## 2021-05-23 NOTE — Progress Notes (Signed)
?Progress Note ? ? ?Patient: Gregory [name] ZOX:[id number] DOB: 1920-07-16 DOA: 05/19/2021     4 ?DOS: the patient was seen and examined on 05/23/2021 ?  ?Brief hospital course: ?Mr. Marchant was admitted to the hospital with the working diagnosis of decompensated heart failure, cardiogenic pulmonary edema and bilateral pleural effusions.  ? ?88 yo male with the past medical history of paroxysmal atrial fibrillation, pulmonary hypertension, peripheral vascular disease with bilateral toe gangrene, dysphagia sp peg tube and aortic stenosis sp TAVR.  ?Reported worsening dyspnea for 2 weeks, associated with productive cough. As outpatient he was treated with oral antibiotics with no improvement in his symptoms. On his initial physical examination his blood pressure was 121/66, HR 69, RR 20, 02 saturation 97%. Lungs with bilateral rales and increased work of breathing, heart with S1 and S2 present and rhythmic, abdomen not distended, no lower extremity edema, several gangrene like changes on bilateral toes.  ? ?Na 137, K 4,7 Cl 106, bicarbonate 26, glucose 122, bun 48 cr 1,37 ?BNP 343  ?High sensitive troponin 24 and 24  ?Wbc 8,9 hgb 7,9 plt 190  ?SARS covid 19 positive (positive in 03/2021)  ? ?Chest radiograph with bilateral interstitial infiltrates with bilateral hilar congestion and bilateral moderate pleural effusions.  ? ?EKG 76 bpm, left axis deviation, ventricular paced rhythm, with no significant ST segment or T wave changes.  ? ?Patient had one unit PRBC and was placed on furosemide for diuresis.  ?Patient placed on furosemide for diuresis and resume tube feedings.  ? ?Improving pulmonary edema.  ?Patient has been out of bed with physical therapy.  ? ?Assessment and Plan: ?* Paroxysmal atrial fibrillation ([place]) ?Ventricular paced rhythm. ? ?Patient not on anticoagulation due to history of gastrointestinal bleeding.  ?Continue telemetry monitoring.  ?Not on AV blocking agents.  ? ?CHF (congestive heart failure), NYHA  class IV ([place]) ?Echocardiogram with LV EF 55%, with no wall motion abnormalities, RV systolic function preserved, moderate TR, no aortic stenosis (SP TAVR).  ? ?Documented urine output is 3,300 ml.  ?Blood pressure 107 mmHg. ? ? ?Acute hypoxemic respiratory failure due to cardiogenic pulmonary edema. ? ?Follow up chest radiograph personally reviewed with improvement in pulmonary edema, small bilateral pleural effusions. ? ?Oxygenation has improved with 02 saturation 99% on 2 L/min per [place] ?Continue to wean off supplemental -02 to keep 02 saturation 92% or greater.  ? ? ?Anemia ?SP 1 unit PRBC transfusion. ? ?Iron panel with serum iron 45, transferrin saturation 16, transferrin 202 and ferritin 506, consistent with anemia of chronic disease combined with iron deficiency.  ? ?Follow up hgb at 8,1 ?Tolerating well IV iron. ?Plan to follow up iron panel as outpatient, hold on further PRBC transfusion for now to prevent volume overload.  ? ?Acute kidney injury superimposed on chronic kidney disease ([place]) ?Patient with improvement in volume status, his renal function today has a serum cr of 1,62 with K at 4,4 and serum bicarbonate at 31. ? ?Hold on furosemide for now, continue blood pressure monitoring and tube feedings for nutrition. ?Follow up renal function in am, avoid hypotension or nephrotoxic medications.  ? ?Hypertension ?Blood pressure systolic in the 811'B ?Continue close monitoring, hold on further diuresis for now.  ? ? ? ?Protein calorie malnutrition ([place]) ?Severe calorie protein malnutrition.  ?Patient on tube feedings. ?Consulted nutrition for recommendations.  ?Plan for continuous feedings for now.  ? ? ?Dysphagia s/p G tube  ?Tolerating well tube feedings.  ? ?Deafness ?Patient high risk for hospital delirium.  ?  Patient on quetiapine.  ?Patient with improved mentation, more reactive.  ?No encephalopathy.  ? ? ?Dyslipidemia ?Continue with statin therapy.  ? ?Thyroid disease ?Continue with  levothyroxine ? ? ?History of COVID-19 ?Positive COVID 19 on 2/17, continue positive test after 7 weeks. ?No clinical signs of active viral infection. ?Discontinue respiratory isolation.  ?PT and OT ?Patient very weak and deconditioned.  ? ?PVD (peripheral vascular disease) ([place]) ?Bilateral gangrene toes, no signs of local bacterial infection. ?Plan for conservative care.  ? ? ?Subjective: patient is feeling better, he is more awake and alert, no dyspnea or chest pain, tolerating well tube feedings, he did ambulate with pt yesterday.  ? ?Physical Exam: ?Vitals:  ? 05/22/21 0737 05/22/21 0800 05/22/21 2020 05/23/21 0724  ?BP: (!) 108/45 (!) 106/48 (!) 107/43 (!) 104/47  ?Pulse: 69 69 69 69  ?Resp: '20 17 19 18  '$ ?Temp:   (!) 97.5 ?F (36.4 ?C) 97.6 ?F (36.4 ?C)  ?TempSrc:   Oral Oral  ?SpO2: 97% 98% 99% 93%  ?Weight:      ?Height:      ? ?Neurology awake and alert ?ENT with mild pallor ?Cardiovascular with S1 and S2 present and rhythmic with no gallops, positive 3/6 systolic murmur at the right sternal border.  ?No JVD ?No lower extremity edema ?Respiratory with mild scattered rales, no rhonchi or wheezing ?Abdomen with no distention ?Skin toe lesions with dressing in place.  ? ?Data Reviewed: ? ? ? ?Family Communication: I spoke over the phone with the patient's son about patient's  condition, plan of care, prognosis and all questions were addressed.  ? ?Disposition: ?Status is: Inpatient ?Remains inpatient appropriate because: heart failure and renal failure  ? Planned Discharge Destination: Home ? ?Author: ?Tawni Millers, MD ?05/23/2021 11:16 AM ? ?For on call review [email or web address].  ?

## 2021-05-24 ENCOUNTER — Inpatient Hospital Stay (HOSPITAL_COMMUNITY): Payer: Medicare Other

## 2021-05-24 DIAGNOSIS — I480 Paroxysmal atrial fibrillation: Secondary | ICD-10-CM | POA: Diagnosis not present

## 2021-05-24 DIAGNOSIS — D649 Anemia, unspecified: Secondary | ICD-10-CM | POA: Diagnosis not present

## 2021-05-24 DIAGNOSIS — I10 Essential (primary) hypertension: Secondary | ICD-10-CM | POA: Diagnosis not present

## 2021-05-24 DIAGNOSIS — I5033 Acute on chronic diastolic (congestive) heart failure: Secondary | ICD-10-CM | POA: Diagnosis not present

## 2021-05-24 LAB — BASIC METABOLIC PANEL
Anion gap: 8 (ref 5–15)
BUN: 64 mg/dL — ABNORMAL HIGH (ref 8–23)
CO2: 30 mmol/L (ref 22–32)
Calcium: 9.5 mg/dL (ref 8.9–10.3)
Chloride: 97 mmol/L — ABNORMAL LOW (ref 98–111)
Creatinine, Ser: 1.51 mg/dL — ABNORMAL HIGH (ref 0.61–1.24)
GFR, Estimated: 41 mL/min — ABNORMAL LOW (ref >60)
Glucose, Bld: 107 mg/dL — ABNORMAL HIGH (ref 70–99)
Potassium: 4 mmol/L (ref 3.5–5.1)
Sodium: 135 mmol/L (ref 135–145)

## 2021-05-24 LAB — PHOSPHORUS: Phosphorus: 4.3 mg/dL (ref 2.5–4.6)

## 2021-05-24 MED ORDER — NEPRO/CARBSTEADY PO LIQD
1320.0000 mL | ORAL | Status: DC
  Administered 2021-05-24: 1320 mL
  Filled 2021-05-24 (×2): qty 2000

## 2021-05-24 NOTE — Progress Notes (Signed)
I have talked to patient, her son and daughter at the bedside about patient's current condition. Multiorgan failure, respiratory failure, heart failure and renal failure.  ?Poor prognosis, and high risk for rehospitalization despite aggressive medical therapy.  ?We also talked about advance directives including code status. Very low probability of successful resuscitation and high risk for prolonging suffering. ?Patient will like to continue with full code, his children have accepted palliative care services at the time of discharge.  ?

## 2021-05-24 NOTE — Progress Notes (Signed)
?Progress Note ? ? ?Patient: Gregory [name] ESP:[id number] DOB: [birth year] DOA: 05/19/2021     5 ?DOS: the patient was seen and examined on 05/24/2021 ?  ?Brief hospital course: ?Gregory [name] was admitted to the hospital with the working diagnosis of decompensated heart failure, cardiogenic pulmonary edema and bilateral pleural effusions.  ? ?88 yo male with the past medical history of paroxysmal atrial fibrillation, pulmonary hypertension, peripheral vascular disease with bilateral toe gangrene, dysphagia sp peg tube and aortic stenosis sp TAVR.  ?Reported worsening dyspnea for 2 weeks, associated with productive cough. As outpatient he was treated with oral antibiotics with no improvement in his symptoms. On his initial physical examination his blood pressure was 121/66, HR 69, RR 20, 02 saturation 97%. Lungs with bilateral rales and increased work of breathing, heart with S1 and S2 present and rhythmic, abdomen not distended, no lower extremity edema, several gangrene like changes on bilateral toes.  ? ?Na 137, K 4,7 Cl 106, bicarbonate 26, glucose 122, bun 48 cr 1,37 ?BNP 343  ?High sensitive troponin 24 and 24  ?Wbc 8,9 hgb 7,9 plt 190  ?SARS covid 19 positive (positive in 03/2021)  ? ?Chest radiograph with bilateral interstitial infiltrates with bilateral hilar congestion and bilateral moderate pleural effusions.  ? ?EKG 76 bpm, left axis deviation, ventricular paced rhythm, with no significant ST segment or T wave changes.  ? ?Patient had one unit PRBC and was placed on furosemide for diuresis.  ?Patient placed on furosemide for diuresis and resume tube feedings.  ? ?Improving pulmonary edema.  ?Patient has been out of bed with physical therapy.  ? ?Assessment and Plan: ?* Paroxysmal atrial fibrillation ([place]) ?Ventricular paced rhythm. ? ?Patient not on anticoagulation due to history of gastrointestinal bleeding.  ?Continue telemetry monitoring.  ?Not on AV blocking agents.  ? ?CHF (congestive heart failure), NYHA  class IV ([place]) ?Echocardiogram with LV EF 55%, with no wall motion abnormalities, RV systolic function preserved, moderate TR, no aortic stenosis (SP TAVR).  ? ?Documented urine output is 3,300 ml.  ?Blood pressure 107 mmHg. ? ?Acute hypoxemic respiratory failure due to cardiogenic pulmonary edema. ? ?04/12 Follow up chest radiograph personally reviewed with improvement in pulmonary edema, small bilateral pleural effusions. ? ?Continue supplemental 02 per [place] to keep 02 saturation 88% or greater.  ?Limited diuresis due to renal failure.  ? ? ?Anemia ?SP 1 unit PRBC transfusion. ? ?Iron panel with serum iron 45, transferrin saturation 16, transferrin 202 and ferritin 506, consistent with anemia of chronic disease combined with iron deficiency.  ? ?Follow up hgb at 8,1 ?SP IV iron x2  ?Plan to follow up iron panel as outpatient, hold on further PRBC transfusion for now to prevent volume overload.  ? ?Acute kidney injury superimposed on chronic kidney disease ([place]) ?Renal function with serum cr at 1.51 with K at 4,0 and serum bicarbonate at 30. ?Plan to continue close monitoring renal function and electrolytes. ?Hold on furosemide for now.  ? ?Hypertension ?Blood pressure 97 to 120 mmHg.  ?Plant to continue close blood pressure monitoring. ?Continue to hold on furosemide.  ? ? ?Protein calorie malnutrition ([place]) ?Severe calorie protein malnutrition.  ?Patient on tube feedings. ?Follow up with nutrition recommendations.  ?Plan for continuous feedings for now.  ? ? ?Dysphagia s/p G tube  ?Tolerating well tube feedings.  ? ?Deafness ?Patient high risk for hospital delirium.  ?Patient on quetiapine.  ?Patient with improved mentation, more reactive.  ?No encephalopathy.  ? ? ?Dyslipidemia ?Continue with statin therapy.  ? ?  Thyroid disease ?Continue with levothyroxine ? ? ?History of COVID-19 ?Positive COVID 19 on 2/17, continue positive test after 7 weeks. ?No clinical signs of active viral infection. ?Discontinue  respiratory isolation.  ?PT and OT ?Patient very weak and deconditioned.  ? ?PVD (peripheral vascular disease) ([place]) ?Bilateral gangrene toes, no signs of local bacterial infection. ?Plan for conservative care.  ? ? ? ? ?  ? ?Subjective: Patient very weak and deconditioned, tolerating well tube feedings.  ? ?Physical Exam: ?Vitals:  ? 05/24/21 0300 05/24/21 0323 05/24/21 0530 05/24/21 0721  ?BP:  (!) 106/48  (!) 111/54  ?Pulse: 69 69  69  ?Resp: '17 18  17  '$ ?Temp:  98 ?F (36.7 ?C)  97.8 ?F (36.6 ?C)  ?TempSrc:  Oral  Oral  ?SpO2: 93% 91%  93%  ?Weight:   67.4 kg   ?Height:      ? ?Neurology awake and alert ?ENT with mild pallor ?Cardiovascular with regular S1 and S2 with no gallops or murmurs ?No JVD ?No lower extremity edema ?Respiratory with rales at bases bilaterally ?Abdomen soft and not distended ?Bilateral feet with wounds, dressing in place.  ?Data Reviewed: ? ? ? ?Family Communication: I spoke with patient's son and daughter at the bedside, we talked in detail about patient's condition, plan of care and prognosis and all questions were addressed. ? ? ?Disposition: ?Status is: Inpatient ?Remains inpatient appropriate because: heart, renal and respiratory failure  ? Planned Discharge Destination: Home. Possible discharge home on 04/14  ? ? ? ?Author: ?Tawni Millers, MD ?05/24/2021 1:00 PM ? ?For on call review [email or web address].  ?

## 2021-05-24 NOTE — Plan of Care (Signed)

## 2021-05-24 NOTE — Progress Notes (Signed)
Nutrition Follow-up ? ?DOCUMENTATION CODES:  ?Not applicable ? ?INTERVENTION:  ?-check phosphorus level  ? ?Continue TF via PEG: ?-Nepro @ 20m/hr (13242mday) ? ?At goal, TF provides 2336 kcals, 106 grams protein, 959 ml free water ? ?Recommended home TF regimen: ?-39518mova TID (total of 5 cartons per day) ?-300m17mee water TID ? ?Home TF regimen will provide 2375 kcals, 110 grams protein, 840 ml free water (1740ml27mal free water w/ flushes) ? ?NUTRITION DIAGNOSIS:  ?Inadequate oral intake related to dysphagia as evidenced by NPO status. ?ongoing ? ?GOAL:  ?Patient will meet greater than or equal to 90% of their needs ?Met with TF ? ?MONITOR:  ?Labs, Weight trends, TF tolerance, Skin, I & O's ? ?REASON FOR ASSESSMENT:  ?Consult ?Enteral/tube feeding initiation and management ? ?ASSESSMENT:  ?Gregory [name] 89 y.37 male with medical history significant of   PAF status post ablation off Eliquis due to severe GI bleed with chronic iron deficiency anemia, moderate pulmonary hypertension with severe TR (Echo 2020), dysphagia status post PEG tube, PPM, AS status post TAVR, PVD with chronic bilateral toe gangrene changes on Plavix, PPM, hypothyroidism, presented with increasing shortness of breath. ? ?Pt's daughter and son at bedside and first discuss concerns regarding pt's weights on file as they feel the weights have been inaccurate/too high. Pt's family reports pt has been weighing 130-135 lbs at other facilities (previous UBW prior to dysphagia was ~150 lbs per pt's daughter); lowest weight for this admission is 148 lbs. Suspect weight is lower than indicated in chart.  ? ?Pt's daughter and son report that pt has actually been receiving 1-1/3 carton (316ml)55ma TID w/ 300ml f98mwater for a total of 4 cartons per day. Pt's daughter states pt has previously tolerated 2 cartons w/ large free water flush previously. Due to need for pt to gain weight, will increase calorie/protein goals and home TF regimen.  Recommend increasing to a total of 5 cartons per day to provide 2375 kcals and 110 grams protein. Pt's family would like to continue to do 3 feeds per day. Noted pt's daughter does not want feeding separated from free water flushes.  ? ?Discussed plan of care with MD who is agreeable to pt discharging on bolus feeds.  ? ?Of note, pt's family pointed out that pt has outpatient med orders for phosphorus and potassium. Potassium level is WNL. Will check phosphorus level and if WNL will recommend discontinuing supplementation. MD agreeable to plan.  ? ?UOP: 1175ml x234murs ?I/O: -2713ml sin3mdmit ? ?Medications: ? feeding supplement (NEPRO CARB STEADY)  1,000 mL Per Tube Q24H  ? feeding supplement (PROSource TF)  45 mL Per Tube Daily  ? free water  300 mL Per Tube Q8H  ? pantoprazole sodium  40 mg Per Tube BID  ? senna-docusate  2 tablet Per Tube QHS  ? sucralfate  1 g Per Tube Q8H  ? ?Labs: ?Recent Labs  ?Lab 05/22/21 ?0112 05/23/21 ?0042 04/11610 ?0100  ?NA 136 137 135  ?K 4.8 4.4 4.0  ?CL 99 98 97*  ?CO2 '29 31 30  ' ?BUN 47* 59* 64*  ?CREATININE 1.42* 1.62* 1.51*  ?CALCIUM 9.4 9.5 9.5  ?MG 2.2  --   --   ?GLUCOSE 102* 102* 107*  ?Hgb 8.1 ? ?Diet Order:   ?Diet Order   ? ?       ?  Diet NPO time specified  Diet effective now       ?  ? ?  ?  ? ?  ? ?  EDUCATION NEEDS:  ?No education needs have been identified at this time ? ?Skin:  Skin Assessment: Reviewed RN Assessment ? ?Last BM:  4/6 ? ?Height:  ?Ht Readings from Last 1 Encounters:  ?05/19/21 '5\' 8"'  (1.727 m)  ? ?Weight:  ?Wt Readings from Last 1 Encounters:  ?05/24/21 67.4 kg  ? ?Ideal Body Weight:  70 kg ? ?BMI:  Body mass index is 22.59 kg/m?. ? ?Estimated Nutritional Needs:  ?Kcal:  2150-2350 ?Protein:  105-115 grams ?Fluid:  > 1.7 L ? ? ? ?Theone Stanley., MS, RD, LDN (she/her/hers) ?RD pager number and weekend/on-call pager number located in [place]. ? ?

## 2021-05-25 DIAGNOSIS — I480 Paroxysmal atrial fibrillation: Secondary | ICD-10-CM | POA: Diagnosis not present

## 2021-05-25 DIAGNOSIS — I5033 Acute on chronic diastolic (congestive) heart failure: Secondary | ICD-10-CM | POA: Diagnosis not present

## 2021-05-25 DIAGNOSIS — I10 Essential (primary) hypertension: Secondary | ICD-10-CM | POA: Diagnosis not present

## 2021-05-25 DIAGNOSIS — N179 Acute kidney failure, unspecified: Secondary | ICD-10-CM | POA: Diagnosis not present

## 2021-05-25 LAB — CBC
HCT: 26.3 % — ABNORMAL LOW (ref 39.0–52.0)
Hemoglobin: 8.2 g/dL — ABNORMAL LOW (ref 13.0–17.0)
MCH: 30.5 pg (ref 26.0–34.0)
MCHC: 31.2 g/dL (ref 30.0–36.0)
MCV: 97.8 fL (ref 80.0–100.0)
Platelets: 145 10*3/uL — ABNORMAL LOW (ref 150–400)
RBC: 2.69 MIL/uL — ABNORMAL LOW (ref 4.22–5.81)
RDW: 15.7 % — ABNORMAL HIGH (ref 11.5–15.5)
WBC: 6.2 10*3/uL (ref 4.0–10.5)
nRBC: 0 % (ref 0.0–0.2)

## 2021-05-25 LAB — BASIC METABOLIC PANEL
Anion gap: 7 (ref 5–15)
BUN: 62 mg/dL — ABNORMAL HIGH (ref 8–23)
CO2: 31 mmol/L (ref 22–32)
Calcium: 9.9 mg/dL (ref 8.9–10.3)
Chloride: 98 mmol/L (ref 98–111)
Creatinine, Ser: 1.42 mg/dL — ABNORMAL HIGH (ref 0.61–1.24)
GFR, Estimated: 45 mL/min — ABNORMAL LOW (ref >60)
Glucose, Bld: 103 mg/dL — ABNORMAL HIGH (ref 70–99)
Potassium: 4.1 mmol/L (ref 3.5–5.1)
Sodium: 136 mmol/L (ref 135–145)

## 2021-05-25 MED ORDER — FUROSEMIDE 20 MG PO TABS
20.0000 mg | ORAL_TABLET | Freq: Every day | ORAL | Status: DC
  Administered 2021-05-25: 20 mg
  Filled 2021-05-25: qty 1

## 2021-05-25 NOTE — Plan of Care (Signed)

## 2021-05-25 NOTE — TOC Progression Note (Signed)
Transition of Care (TOC) - Progression Note  ? ? ?Patient Details  ?Name: Gregory [name] ?MRN: [id number] ?Date of Birth: 10-28-1920 ? ?Transition of Care (TOC) CM/SW Contact  ?Angelita Ingles, RN ?Phone [phone] ? ?05/25/2021, 12:18 PM ? ?Clinical Narrative:    ?CM received consult for outpatient palliative care services. CM spoke with son Gregory [name] to offer choice. Per [name] the family would prefer [facility]. Referral request has been accepted by [name] with [facility].  ? ? ?  ?  ? ?Expected Discharge Plan and Services ?  ?  ?  ?  ?  ?                ?  ?  ?  ?  ?  ?  ?  ?  ?  ?  ? ? ?Social Determinants of Health (SDOH) Interventions ?  ? ?Readmission Risk Interventions ? ?  03/10/2021  ?  3:21 PM  ?Readmission Risk Prevention Plan  ?Transportation Screening Complete  ?PCP or Specialist Appt within 5-7 Days Complete  ?Home Care Screening Complete  ?Medication Review (RN CM) Complete  ? ? ?

## 2021-05-25 NOTE — TOC Progression Note (Signed)
Transition of Care (TOC) - Progression Note  ? ? ?Patient Details  ?Name: Alix Stowers ?MRN: [id number] ?Date of Birth: 03-30-1920 ? ?Transition of Care (TOC) CM/SW Contact  ?Angelita Ingles, RN ?Phone Number:[phone] ? ?05/25/2021, 12:03 PM ? ?Clinical Narrative:    ?TOC acknowledges consult. SW will follow for patient from [facility] with 24/7 care through [facility]. Patient is also currently active with [facility] health.  ? ?  ?  ? ?Expected Discharge Plan and Services ?  ?  ?  ?  ?  ?                ?  ?  ?  ?  ?  ?  ?  ?  ?  ?  ? ? ?Social Determinants of Health (SDOH) Interventions ?  ? ?Readmission Risk Interventions ? ?  03/10/2021  ?  3:21 PM  ?Readmission Risk Prevention Plan  ?Transportation Screening Complete  ?PCP or Specialist Appt within 5-7 Days Complete  ?Home Care Screening Complete  ?Medication Review (RN CM) Complete  ? ? ?

## 2021-05-25 NOTE — Progress Notes (Signed)
Physical Therapy Treatment ?Patient Details ?Name: Gregory [name] ?MRN: [id number] ?DOB: [birth year] ?Today's Date: 05/25/2021 ? ? ?History of Present Illness Patient is a 88 yo male presenting to the ED with SOB and cough on 05/19/21. Admitted with decompensated heart failure, cardiogenic pulmonary edema and bilateral pleural effusions. . Patient with two prior admissions in 1/23 and 2/23. PMH includes: aortic stenosis, HTN, HLD, paroxysmal atrial flutter and heart block with pacemaker in place not on anticoagulation, dysphagia with G-tube in place. gangrene on multiple toes, COVID+ ? ?  ?PT Comments  ? ? Pt received in supine, agreeable to therapy session with encouragement as he was sleeping and apprehensive to get out of bed and with fair tolerance for transfer and pre-gait training at bedside. Pt quick to fatigue and needing up to modA for stepping at bedside with RW support and transfer OOB to chair. Pt continues to benefit from PT services to progress toward functional mobility goals.    ?Recommendations for follow up therapy are one component of a multi-disciplinary discharge planning process, led by the attending physician.  Recommendations may be updated based on patient status, additional functional criteria and insurance authorization. ? ?Follow Up Recommendations ? Home health PT ?  ?  ?Assistance Recommended at Discharge Frequent or constant Supervision/Assistance  ?Patient can return home with the following A little help with walking and/or transfers;A little help with bathing/dressing/bathroom;Assistance with cooking/housework;Assist for transportation;Help with stairs or ramp for entrance ?  ?Equipment Recommendations ? None recommended by PT  ?  ?Recommendations for Other Services   ? ? ?  ?Precautions / Restrictions Precautions ?Precautions: Fall;Other (comment) ?Precaution Comments: G tube ?Restrictions ?Weight Bearing Restrictions: No  ?  ? ?Mobility ? Bed Mobility ?Overal bed mobility: Needs  Assistance ?Bed Mobility: Rolling, Sidelying to Sit ?Rolling: Min assist ?Sidelying to sit: Mod assist, +2 for safety/equipment ?  ?  ?  ?General bed mobility comments: Assist at trunk to come up to sitting, heavy reliance on bed rail, transfer pad assist to scoot hips forward ?  ? ?Transfers ?Overall transfer level: Needs assistance ?Equipment used: Rolling walker (2 wheels) ?Transfers: Sit to/from Stand ?Sit to Stand: Mod assist ?  ?  ?  ?  ?  ?General transfer comment: from EOB and to recliner, poor eccentric control to sit ?  ? ?Ambulation/Gait ?Ambulation/Gait assistance: Mod assist, +2 safety/equipment ?Gait Distance (Feet): 6 Feet ?Assistive device: Rolling walker (2 wheels) ?Gait Pattern/deviations: Step-through pattern, Decreased stride length, Trunk flexed, Narrow base of support ?Gait velocity: decreased ?Gait velocity interpretation: <1.31 ft/sec, indicative of household ambulator ?  ?General Gait Details: pt c/o BLE fatigue after short distance and close chair follow for safety; SpO2 WFL on RA; BP stable sitting post-exertion ? ? ? ?  ?Balance Overall balance assessment: Needs assistance ?Sitting-balance support: Feet supported, No upper extremity supported ?Sitting balance-Leahy Scale: Poor ?Sitting balance - Comments: R/posterior lean initially, needs minA progressing to min guard ?Postural control: Posterior lean ?Standing balance support: During functional activity, Reliant on assistive device for balance ?Standing balance-Leahy Scale: Poor ?Standing balance comment: reliant on RW and external assist ?  ?  ?  ?  ? ?  ?Cognition Arousal/Alertness: Awake/alert ?Behavior During Therapy: [facility] for tasks assessed/performed ?Overall Cognitive Status: Within Functional Limits for tasks assessed ?  ?  ?General Comments: quick witted, HoH, decreased insight into need for repositioning/OOB. ?  ?  ? ?  ?Exercises   ? ?  ?General Comments General comments (skin integrity, edema, etc.): BP 104/42 (61)  supine prior  to OOB; BP 123/47 (71) seated in recliner post-exertion; HR WFL ?  ?  ? ?Pertinent Vitals/Pain Pain Assessment ?Pain Assessment: Faces ?Faces Pain Scale: Hurts little more ?Pain Location: bilateral heels ?Pain Descriptors / Indicators: Discomfort ?Pain Intervention(s): Monitored during session, Repositioned, Limited activity within patient's tolerance  ? ? ? ?PT Goals (current goals can now be found in the care plan section) Acute Rehab PT Goals ?Patient Stated Goal: to go home ?PT Goal Formulation: With patient ?Time For Goal Achievement: 06/05/21 ?Progress towards PT goals: Progressing toward goals ? ?  ?Frequency ? ? ? Min 3X/week ? ? ? ?  ?PT Plan Current plan remains appropriate  ? ? ?   ?AM-PAC PT "6 Clicks" Mobility   ?Outcome Measure ? Help needed turning from your back to your side while in a flat bed without using bedrails?: A Little ?Help needed moving from lying on your back to sitting on the side of a flat bed without using bedrails?: A Lot ?Help needed moving to and from a bed to a chair (including a wheelchair)?: A Lot ?Help needed standing up from a chair using your arms (e.g., wheelchair or bedside chair)?: A Lot ?Help needed to walk in hospital room?: A Lot ?Help needed climbing 3-5 steps with a railing? : Total ?6 Click Score: 12 ? ?  ?End of Session Equipment Utilized During Treatment: Gait belt ?Activity Tolerance: Patient tolerated treatment well;Patient limited by fatigue ?Patient left: in chair;with call bell/phone within reach;with chair alarm set ?Nurse Communication: Mobility status;Other (comment) (pt needs cord from chair alarm box to secretary notification on wall, chair alarm is set to sound but won't communicate to front desk, RN notified) ?PT Visit Diagnosis: Muscle weakness (generalized) (M62.81);Difficulty in walking, not elsewhere classified (R26.2) ?  ? ? ?Time: 2355-7322 ?PT Time Calculation (min) (ACUTE ONLY): 29 min ? ?Charges:  $Therapeutic Activity: 23-37 mins           ?          ? ?Melanny Wire P., PTA ?Acute Rehabilitation Services ?[facility] 9a-5:30pm ?Office: [phone]  ? ? ?Shalena Ezzell M Deziah Renwick ?05/25/2021, 3:38 PM ? ?

## 2021-05-25 NOTE — Social Work (Signed)
Pt is from [facility] contacted admissions, no answer CSW will follow up for pt to DC back to the facility. ?

## 2021-05-25 NOTE — Progress Notes (Signed)
?Progress Note ? ? ?Patient: Gregory [name] NOB:[id number] DOB: 16-Jul-1920 DOA: 05/19/2021     6 ?DOS: the patient was seen and examined on 05/25/2021 ?  ?Brief hospital course: ?Mr. Burruss was admitted to the hospital with the working diagnosis of decompensated heart failure, cardiogenic pulmonary edema and bilateral pleural effusions.  ? ?88 yo male with the past medical history of paroxysmal atrial fibrillation, pulmonary hypertension, peripheral vascular disease with bilateral toe gangrene, dysphagia sp peg tube and aortic stenosis sp TAVR.  ?Reported worsening dyspnea for 2 weeks, associated with productive cough. As outpatient he was treated with oral antibiotics with no improvement in his symptoms. On his initial physical examination his blood pressure was 121/66, HR 69, RR 20, 02 saturation 97%. Lungs with bilateral rales and increased work of breathing, heart with S1 and S2 present and rhythmic, abdomen not distended, no lower extremity edema, several gangrene like changes on bilateral toes.  ? ?Na 137, K 4,7 Cl 106, bicarbonate 26, glucose 122, bun 48 cr 1,37 ?BNP 343  ?High sensitive troponin 24 and 24  ?Wbc 8,9 hgb 7,9 plt 190  ?SARS covid 19 positive (positive in 03/2021)  ? ?Chest radiograph with bilateral interstitial infiltrates with bilateral hilar congestion and bilateral moderate pleural effusions.  ? ?EKG 76 bpm, left axis deviation, ventricular paced rhythm, with no significant ST segment or T wave changes.  ? ?Patient had one unit PRBC and was placed on furosemide for diuresis.  ?Patient placed on furosemide for diuresis and resume tube feedings.  ? ?Improving pulmonary edema.  ?Patient has been out of bed with physical therapy.  ? ?Assessment and Plan: ?* Paroxysmal atrial fibrillation ([place]) ?Ventricular paced rhythm. ? ?Patient not on anticoagulation due to history of gastrointestinal bleeding.  ?Continue telemetry monitoring.  ?Not on AV blocking agents.  ? ?CHF (congestive heart failure), NYHA  class IV ([place]) ?Echocardiogram with LV EF 55%, with no wall motion abnormalities, RV systolic function preserved, moderate TR, no aortic stenosis (SP TAVR).  ? ?Documented urine output is 3,000 ml.  ?Systolic blood pressure 947 to 108 mmHg.  ? ?Acute hypoxemic respiratory failure due to cardiogenic pulmonary edema. ? ?04/12 Follow up chest radiograph personally reviewed with improvement in pulmonary edema, small bilateral pleural effusions. ? ?Plan to resume diuresis with oral furosemide 20 mg daily.  ?Limited pharmacological options due to hypotension and renal failure.  ?  ? ? ?Anemia ?SP 1 unit PRBC transfusion. ? ?Iron panel with serum iron 45, transferrin saturation 16, transferrin 202 and ferritin 506, consistent with anemia of chronic disease combined with iron deficiency.  ? ?Follow up hgb at 8,2 ?SP IV iron x2  ?Plan to follow up iron panel as outpatient, hold on further PRBC transfusion for now to prevent volume overload.  ? ?Acute kidney injury superimposed on chronic kidney disease ([place]) ?Renal function today with serum cr t 1,42, K is 4.1 and serum bicarbonate at 31.  ?Continue diuresis with furosemide 20 mg daily.  ?Follow up renal function in am, if stable renal function patient will continue with 20 mg daily and increase as needed for signs of volume overload.   ? ?Hypertension ?Blood pressure systolic in the 654 range. ?Plan to continue diuresis with furosemide.  ? ? ?Protein calorie malnutrition ([place]) ?Severe calorie protein malnutrition.  ?Patient on tube feedings. ?Follow up with nutrition recommendations.  ?Plan for continuous feedings for now.  ? ? ?Dysphagia s/p G tube  ?Tolerating well tube feedings.  ? ?Deafness ?Patient high risk for hospital  delirium.  ?Patient on quetiapine.  ?Patient with improved mentation, more reactive.  ?No encephalopathy.  ? ? ?Dyslipidemia ?Continue with statin therapy.  ? ?Thyroid disease ?Continue with levothyroxine ? ? ?History of COVID-19 ?Positive COVID 19 on  2/17, continue positive test after 7 weeks. ?No clinical signs of active viral infection. ?Discontinue respiratory isolation.  ?PT and OT ?Patient very weak and deconditioned.  ? ?PVD (peripheral vascular disease) ([place]) ?Bilateral gangrene toes, no signs of local bacterial infection. ?Plan for conservative care.  ? ? ? ? ?  ? ?Subjective: patient is feeling better, dyspnea has been improving, no chest pain and tolerating well tube feedings.  ? ?Physical Exam: ?Vitals:  ? 05/25/21 0409 05/25/21 0425 05/25/21 0806 05/25/21 1246  ?BP: (!) 101/46  (!) 111/56 (!) 104/42  ?Pulse: 70  70 70  ?Resp: '18  20 19  '$ ?Temp: 97.8 ?F (36.6 ?C)  97.6 ?F (36.4 ?C) (!) 97.4 ?F (36.3 ?C)  ?TempSrc: Oral  Oral Oral  ?SpO2: 97%  99% 97%  ?Weight:  65 kg    ?Height:      ? ?Neurology awake and alert ?ENT with mild pallor with no icterus ?Cardiovascular with S1 and S2 present and rhythmic with positive murmur at the apex, systolic 3/6. ?No JVD ?No lower extremity edema ?Respiratory with no wheezing or rales ?Abdomen not distended, peg tube in place.  ?Data Reviewed: ? ? ? ?Family Communication: I spoke with patient's daughter at the bedside, we talked in detail about patient's condition, plan of care and prognosis and all questions were addressed. ? ? ?Disposition: ?Status is: Inpatient ?Remains inpatient appropriate because: heart failure  ? Planned Discharge Destination: Home ? ? ? ?Author: ?Tawni Millers, MD ?05/25/2021 1:00 PM ? ?For on call review [email or web address]  ?

## 2021-05-26 ENCOUNTER — Other Ambulatory Visit (HOSPITAL_COMMUNITY): Payer: Self-pay

## 2021-05-26 DIAGNOSIS — I10 Essential (primary) hypertension: Secondary | ICD-10-CM | POA: Diagnosis not present

## 2021-05-26 DIAGNOSIS — I5033 Acute on chronic diastolic (congestive) heart failure: Secondary | ICD-10-CM | POA: Diagnosis not present

## 2021-05-26 DIAGNOSIS — I480 Paroxysmal atrial fibrillation: Secondary | ICD-10-CM | POA: Diagnosis not present

## 2021-05-26 DIAGNOSIS — E43 Unspecified severe protein-calorie malnutrition: Secondary | ICD-10-CM | POA: Diagnosis not present

## 2021-05-26 LAB — BASIC METABOLIC PANEL
Anion gap: 7 (ref 5–15)
BUN: 70 mg/dL — ABNORMAL HIGH (ref 8–23)
CO2: 30 mmol/L (ref 22–32)
Calcium: 9.9 mg/dL (ref 8.9–10.3)
Chloride: 94 mmol/L — ABNORMAL LOW (ref 98–111)
Creatinine, Ser: 1.52 mg/dL — ABNORMAL HIGH (ref 0.61–1.24)
GFR, Estimated: 41 mL/min — ABNORMAL LOW (ref >60)
Glucose, Bld: 111 mg/dL — ABNORMAL HIGH (ref 70–99)
Potassium: 3.8 mmol/L (ref 3.5–5.1)
Sodium: 131 mmol/L — ABNORMAL LOW (ref 135–145)

## 2021-05-26 LAB — CBC
HCT: 25.2 % — ABNORMAL LOW (ref 39.0–52.0)
Hemoglobin: 8.1 g/dL — ABNORMAL LOW (ref 13.0–17.0)
MCH: 31.2 pg (ref 26.0–34.0)
MCHC: 32.1 g/dL (ref 30.0–36.0)
MCV: 96.9 fL (ref 80.0–100.0)
Platelets: 144 10*3/uL — ABNORMAL LOW (ref 150–400)
RBC: 2.6 MIL/uL — ABNORMAL LOW (ref 4.22–5.81)
RDW: 15.7 % — ABNORMAL HIGH (ref 11.5–15.5)
WBC: 6.6 10*3/uL (ref 4.0–10.5)
nRBC: 0 % (ref 0.0–0.2)

## 2021-05-26 MED ORDER — FIRST-LANSOPRAZOLE 3 MG/ML PO SUSP
15.0000 mg | Freq: Every day | ORAL | 0 refills | Status: DC
  Filled 2021-05-26: qty 180, 30d supply, fill #0

## 2021-05-26 MED ORDER — CLOPIDOGREL BISULFATE 75 MG PO TABS: 75.0000 mg | ORAL_TABLET | Freq: Every day | ORAL | 3 refills | Status: AC

## 2021-05-26 MED ORDER — LANSOPRAZOLE 3 MG/ML SUSP
15.0000 mg | Freq: Every day | ORAL | Status: DC
  Filled 2021-05-26: qty 5

## 2021-05-26 MED ORDER — FAMOTIDINE 40 MG/5ML PO SUSR: 20.0000 mg | Freq: Every day | ORAL | Status: DC

## 2021-05-26 MED ORDER — FUROSEMIDE 20 MG PO TABS
20.0000 mg | ORAL_TABLET | Freq: Every day | ORAL | 0 refills | Status: DC | PRN
Start: 2021-05-26 — End: 2021-05-26
  Filled 2021-05-26: qty 30, 30d supply, fill #0

## 2021-05-26 MED ORDER — FUROSEMIDE 20 MG PO TABS: 20.0000 mg | ORAL_TABLET | Freq: Every day | ORAL | Status: DC | PRN

## 2021-05-26 MED ORDER — FAMOTIDINE 40 MG/5ML PO SUSR
20.0000 mg | Freq: Every day | ORAL | 0 refills | Status: AC
  Filled 2021-05-26: qty 50, 20d supply, fill #0

## 2021-05-26 MED ORDER — FUROSEMIDE 20 MG PO TABS
20.0000 mg | ORAL_TABLET | Freq: Every day | ORAL | 0 refills | Status: DC | PRN
Start: 2021-05-26 — End: 2021-10-20
  Filled 2021-05-26: qty 30, 30d supply, fill #0

## 2021-05-26 MED ORDER — FAMOTIDINE 40 MG/5ML PO SUSR: 20.0000 mg | Freq: Every day | ORAL | 0 refills | Status: DC

## 2021-05-26 NOTE — Progress Notes (Signed)
Care  ? ?Gregory [name] ?Date of Birth: [birth year] ?Medical Record #[id number] ? ?History of Present Illness: ?Gregory [name] is seen for evaluation.  He has a history of atrial flutter and pacemaker. Much of his cardiac care had been at the [facility] clinic in [place]. He reports that he had significant bradycardia in 2002 and had a pacemaker placed ([employer]tr[employer]). This was revised in 2010.  He has a history of atrial flutter and had a successful ablation in the past.  ? ?When  seen by me in January 2018  he was found to be in atrial flutter and was tracking this at a high rate. He was started on Eliquis. Seen by Dr. [name] and pacemaker reprogrammed so he wouldn't track as fast. Attempted to pace terminate his Aflutter without success. When seen later in April 2018 he was found to be back in NSR. Last generator change at [facility] in May 2020.  ? ?In September 2020 he underwent TAVR with a #26 Sapien S3 Ultra valve in [place], [place]. Cardiac cath at that time showed obstructive disease in the distal RCA treated medically. Follow up Echo in October 2020 was satisfactory. He has remained in Afib.  ? ?He had follow up with [facility] clinic in [place] in the fall. CXR Ok. Ecg showed AFib with pacing. He has chronic anemia and CKD. Echo was stable.  ? ?He was admitted at [facility] in June 2022 with Covid 19 PNA. Treated with 3 days of remdesivir. Did not recover well from this with significant weight loss and failure to thrive.  ? ?I last saw him when he was hospitalized in January with recurrent GI bleed. Prior to this he was at  [facility] in [place] with a GI bleed and was transfused multiple units of blood. EGD apparently showed gastric erosions. Ultimately underwent PEG placement for poor nutritional status/ malnutrition and weight loss.  He was continued on Eliquis at [facility]. He was admitted here shortly after with recurrent and ongoing GI bleeding with Hgb to 5.9. He was transfused again. Stools still black and heme  +. We recommended he discontinue Eliquis due to high bleeding risk. He was very deconditioned during that admission and underwent inpatient Rehab until Feb 17 when he was readmitted  2/17-2/23 with Covid 19 associated with fever, fatigue, cough. Treated with steroids and remdesivir. Returned to Rehab until 04/14/21.  ? ?He was seen by Gae Gallop MD at [facility] in March for dry gangrene on the tips of both great toes in addition to a pressure sore on the left heel.  This is was felt to be a limb threatening situation.  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  consideration was given for endovascular options for revascularization.  He is clearly not a candidate for a bypass.  ? ?In the interim he was admitted 4/7-4/14/23 with acute CHF. Diuresed. Was transfused 1 unit of blood. Had AKI. Echo showed LV EF 55%, with no wall motion abnormalities, RV systolic function preserved, moderate TR, no aortic stenosis (SP TAVR). plan was for palliative care evaluation as outpatient given very poor prognosis.  ? ?On follow up today he is seen with his daughter. He is living at home with family. Really not able to do anything. Denies any pain. No SOB. Feels weak in the last couple of days. Is being followed at wound center and reports his gangrene is stable if not better.  ? ? ? ?  Allergies as of 05/30/2021   ? ?   Reactions  ? Cefepime Other (See Comments)  ? Severe myoclonus for cefepime- don't take again-   ? Amlodipine Besylate   ? Other reaction(s): edema  ? Penicillins Hives, Rash  ? ?  ? ?  ?Medication List  ?  ? ?  ? Accurate as of May 30, 2021  3:31 PM. If you have any questions, ask your nurse or doctor.  ?  ?  ? ?  ? ?STOP taking these medications   ? ?acetaminophen 325 MG tablet ?Commonly known as: TYLENOL ?Stopped by: Bianka Liberati [place], MD ?  ?polyethylene glycol 17 g packet ?Commonly known as: MIRALAX /  GLYCOLAX ?Stopped by: Chaz Ronning [place], MD ?  ?QUEtiapine 25 MG tablet ?Commonly known as: SEROQUEL ?Stopped by: Kelin Nixon [place], MD ?  ? ?  ? ?TAKE these medications   ? ?atorvastatin 40 MG tablet ?Commonly known as: LIPITOR ?Take 40 mg by mouth daily. ?What changed: Another medication with the same name was removed. Continue taking this medication, and follow the directions you see here. ?Changed by: Velera Lansdale [place], MD ?  ?chlorhexidine 0.12 % solution ?Commonly known as: PERIDEX ?15 mLs by Mouth Rinse route 2 (two) times daily. ?  ?clopidogrel 75 MG tablet ?Commonly known as: PLAVIX ?Place 1 tablet (75 mg total) into feeding tube daily. ?  ?famotidine 40 MG/5ML suspension ?Commonly known as: PEPCID ?Place 2.5 mLs (20 mg total) into feeding tube at bedtime. ?  ?feeding supplement (NEPRO CARB STEADY) Liqd ?Place 315 mLs into feeding tube 3 (three) times daily. ?What changed: how much to take ?  ?free water Soln ?Place 300 mLs into feeding tube 4 (four) times daily. ?  ?furosemide 20 MG tablet ?Commonly known as: LASIX ?Take 1 tablet Per J Tube daily as needed for edema or fluid (Take as needed for shortness of breath, leg swelling or weight gain 3 lbs in 24-48 hrs to 5 lbs in 7 days.). ?  ?levothyroxine 75 MCG tablet ?Commonly known as: SYNTHROID ?Place 1 tablet (75 mcg total) into feeding tube daily at 6 (six) AM. ?  ?melatonin 5 MG Tabs ?Place 1 tablet (5 mg total) into feeding tube at bedtime as needed. ?What changed: reasons to take this ?  ?pantoprazole 40 MG tablet ?Commonly known as: PROTONIX ?Take 40 mg by mouth daily. Liquid... ?  ?PROBIOTIC PO ?Give 1 capsule by tube daily. ?  ?senna-docusate 8.6-50 MG tablet ?Commonly known as: Senokot-S ?Place 2 tablets into feeding tube at bedtime. ?  ?sucralfate 1 GM/10ML suspension ?Commonly known as: CARAFATE ?Place 10 mLs (1 g total) into feeding tube every 8 (eight) hours. ?  ? ?  ? ? ? ?Allergies  ?Allergen Reactions  ? Cefepime Other (See Comments)  ?  Severe myoclonus  for cefepime- don't take again-   ? Amlodipine Besylate   ?  Other reaction(s): edema  ? Penicillins Hives and Rash  ? ? ?Past Medical History:  ?Diagnosis Date  ? Atrial flutter ([place])   ? s/p ablation  ? Cancer [facility])   ? skin cancer  ? Cataracts, bilateral   ? Hypertension   ? Neuromuscular disorder ([place])   ? Pacemaker 2010  ? Thyroid disease   ? ? ?Past Surgical History:  ?Procedure Laterality Date  ? APPENDECTOMY    ? COLONOSCOPY    ? EYE SURGERY Bilateral   ? cataract surgery  ? HERNIA REPAIR Right   ? INGUINAL HERNIA REPAIR Left 10/12/2013  ? Procedure:  LEFT  INGUINAL HERNIA REPAIR;  Surgeon: Odis Hollingshead, MD;  Location: [place];  Service: General;  Laterality: Left;  ? INSERTION OF MESH Left 10/12/2013  ? Procedure: INSERTION OF MESH;  Surgeon: Odis Hollingshead, MD;  Location: [place];  Service: General;  Laterality: Left;  ? PEG TUBE PLACEMENT Left   ? ? ?Social History  ? ?Socioeconomic History  ? Marital status: Widowed  ?  Spouse name: Not on file  ? Number of children: 2  ? Years of education: Not on file  ? Highest education level: Not on file  ?Occupational History  ? Occupation: retired Actor  ?Tobacco Use  ? Smoking status: Never  ? Smokeless tobacco: Never  ?Vaping Use  ? Vaping Use: Never used  ?Substance and Sexual Activity  ? Alcohol use: No  ? Drug use: No  ? Sexual activity: Not Currently  ?Other Topics Concern  ? Not on file  ?Social History Narrative  ? Not on file  ? ?Social Determinants of Health  ? ?Financial Resource Strain: Not on file  ?Food Insecurity: Not on file  ?Transportation Needs: Not on file  ?Physical Activity: Not on file  ?Stress: Not on file  ?Social Connections: Not on file  ? ? ?Family History  ?Problem Relation Age of Onset  ? Heart disease Mother   ? Cancer Father   ?     leukemia  ? ? ?Review of Systems: ?As noted in HPI.  All other systems were reviewed and are negative. ? ?Physical Exam: ?BP (!) 87/48   Pulse 73   Ht '5\' 8"'$  (1.727 m)   Wt 132 lb (59.9 kg)  Comment: weight from this morning.  SpO2 96%   BMI 20.07 kg/m?  ?Filed Weights  ? 05/30/21 1457  ?Weight: 132 lb (59.9 kg)  ? ? ?  ?GENERAL:  very fraily, elderly WM in NAD ?HEENT:  PERRL, EOMI, sclera are clear. Twanna Hy

## 2021-05-26 NOTE — Discharge Summary (Addendum)
?Physician Discharge Summary ?  ?Patient: Gregory [name] MRN: [id number] DOB: Jun 07, 1920  ?Admit date:     05/19/2021  ?Discharge date: 05/26/21  ?Discharge Physician: Jimmy Picket Jaren Kearn  ? ?PCP: Orpah Greek, DO  ? ?Recommendations at discharge:  ? ? Patient will continue taking furosemide as needed for signs of volume overload. Lower extremity edema, dyspnea or weight gain 3 lbs in 24 to 48 hrs or 5 lbs in seven days.  ?Changed pantoprazole granules to famotidine liquid to prevent feeding tube clogging   ?Discontinue alum mag oxide due to renal failure.  ?Follow up cell count and renal function in 7 days.  ? ?I have talked to patient, her son and daughter at the bedside about patient's current condition. Multiorgan failure, respiratory failure, heart failure and renal failure.  ?Poor prognosis, and high risk for rehospitalization despite aggressive medical therapy.  ?We also talked about advance directives including code status. Very low probability of successful resuscitation and high risk for prolonging suffering. ?Patient will like to continue with full code, his children have accepted palliative care services at the time of discharge.  ?Continue advance directive conversations as outpatient.   ?  ?  ?  ? ?Discharge Diagnoses: ?Principal Problem: ?  Paroxysmal atrial fibrillation ([place]) ?Active Problems: ?  CHF (congestive heart failure), NYHA class IV ([place]) ?  Anemia ?  Acute kidney injury superimposed on chronic kidney disease ([place]) ?  Hypertension ?  Protein calorie malnutrition ([place]) ?  Dysphagia s/p G tube  ?  Deafness ?  Dyslipidemia ?  History of COVID-19 ?  Thyroid disease ?  PVD (peripheral vascular disease) ([place]) ? ?Resolved Problems: ?  * No resolved hospital problems. * ? ?Hospital Course: ?Mr. Wortley was admitted to the hospital with the working diagnosis of decompensated heart failure, cardiogenic pulmonary edema, acute kidney injury and bilateral pleural effusions.  ? ?88 yo male  with the past medical history of paroxysmal atrial fibrillation, pulmonary hypertension, peripheral vascular disease with bilateral toe gangrene, dysphagia sp peg tube and aortic stenosis sp TAVR.  ?Reported worsening dyspnea for 2 weeks, associated with productive cough. As outpatient he was treated with oral antibiotics with no improvement in his symptoms. On his initial physical examination his blood pressure was 121/66, HR 69, RR 20, 02 saturation 97%. Lungs with bilateral rales and increased work of breathing, heart with S1 and S2 present and rhythmic, abdomen not distended, no lower extremity edema, several gangrene like changes on bilateral toes.  ? ?Na 137, K 4,7 Cl 106, bicarbonate 26, glucose 122, bun 48 cr 1,37 ?BNP 343  ?High sensitive troponin 24 and 24  ?Wbc 8,9 hgb 7,9 plt 190  ?SARS covid 19 positive (positive in 03/2021)  ? ?Chest radiograph with bilateral interstitial infiltrates with bilateral hilar congestion and bilateral moderate pleural effusions.  ? ?EKG 76 bpm, left axis deviation, ventricular paced rhythm, with no significant ST segment or T wave changes.  ? ?Patient had one unit PRBC and was placed on furosemide for diuresis.  ?Tube feedings were resumed.  ? ?His volume overload improved.  ?He received IV iron for iron deficiency. ? ?Plan to follow up as outpatient, home health services and palliative care.  ? ?Assessment and Plan: ?* Paroxysmal atrial fibrillation ([place]) ?Ventricular paced rhythm. ? ?Patient not on anticoagulation due to history of gastrointestinal bleeding.  ?Not on AV blocking agents.  ? ?CHF (congestive heart failure), NYHA class IV ([place]) ?Patient was admitted to the cardiac ward and was placed on  furosemide for diuresis, negative fluid balance was achieved, -6,242 ml, with significant improvement in his symptoms.  ? ?Further work up with echocardiogram with LV EF 55%, with no wall motion abnormalities, RV systolic function preserved, moderate TR, no aortic stenosis (SP  TAVR).  ? ?Acute hypoxemic respiratory failure due to cardiogenic pulmonary edema. ? ?Radiographic follow up showed improvement in pulmonary edema.   ?At the time of his discharge his oxygenation was 92%. ?Check home 02 screen before his discharge.  ? ? ?Anemia ?Anemia of chronic disease, with thrombocytopenia.  ? ?Patient underwent 1 unit PRBC transfusion. ? ?Iron panel with serum iron 45, transferrin saturation 16, transferrin 202 and ferritin 506, consistent with anemia of chronic disease combined with iron deficiency.  ? ?Patient received 2 doses of IV iron with good toleration.  ? ?Plan to follow up iron panel as outpatient, hold on further PRBC transfusion for now to prevent volume overload.  ? ?His discharge hgb is 8.1 and plt 144. ?Plan to follow up cell count as outpatient. ? ?Acute kidney injury superimposed on chronic kidney disease ([place]) ?Hyponatremia.  ? ?Patient responded well to diuresis, his peak cr reached 1,62, at the time of discharge is 1,52. His volume status has improved. ? ?Continue diuresis as needed at home and follow up renal function as outpatient.  ? ?At the time of his discharge his serum cr is 1,52 with K at 3.8 and serum bicarbonate 30. Na 131.  ?  ? ?Hypertension ?Blood pressure has remained stable. ?Holding antihypertensive medications.  ? ? ?Protein calorie malnutrition ([place]) ?Severe calorie protein malnutrition.  ?Patient on tube feedings. ?Nutrition was consulted and nutritional supplements were optimized.  ? ?Dysphagia s/p G tube  ?Tolerating well tube feedings.  ?Patient was placed on continuous tube feeding during his hospitalization, at the time of his discharge will resume with bolus feedings.  ? ?Deafness ?Patient on quetiapine.  ?Patient with improved mentation, more reactive with supportive medical therapy.  ?No encephalopathy.  ? ? ?Dyslipidemia ?Continue with statin therapy.  ? ?Thyroid disease ?Continue with levothyroxine ? ? ?History of COVID-19 ?Positive COVID 19 on  2/17, continue positive test after 7 weeks. ?No clinical signs of active viral infection. No specific treatment needed. ?Patient was taken off isolation.  ?Patient very weak and deconditioned.  ? ?PVD (peripheral vascular disease) ([place]) ?Bilateral gangrene toes, no signs of local bacterial infection. ?Plan for conservative care.  ? ? ? ?Consultants: none  ?Procedures performed: none   ?Disposition: Home ?Diet recommendation:  ?NPO tube feedings  ?DISCHARGE MEDICATION: ?Allergies as of 05/26/2021   ? ?   Reactions  ? Cefepime Other (See Comments)  ? Severe myoclonus for cefepime- don't take again-   ? Amlodipine Besylate   ? Other reaction(s): edema  ? Penicillins Hives, Rash  ? ?  ? ?  ?Medication List  ?  ? ?STOP taking these medications   ? ?alum & mag hydroxide-simeth 200-200-20 MG/5ML suspension ?Commonly known as: MAALOX/MYLANTA ?  ?bisacodyl 10 MG suppository ?Commonly known as: DULCOLAX ?  ?ferrous sulfate 220 (44 Fe) MG/5ML solution ?  ?guaiFENesin-dextromethorphan 100-10 MG/5ML syrup ?Commonly known as: ROBITUSSIN DM ?  ?pantoprazole sodium 40 mg ?Commonly known as: PROTONIX ?  ?Phospha 250 Neutral 155-852-130 MG Tabs ?  ? ?  ? ?TAKE these medications   ? ?acetaminophen 325 MG tablet ?Commonly known as: TYLENOL ?Place 1-2 tablets (325-650 mg total) into feeding tube every 4 (four) hours as needed for mild pain. ?  ?atorvastatin 40 MG  tablet ?Commonly known as: LIPITOR ?Place 1 tablet (40 mg total) into feeding tube at bedtime. ?  ?chlorhexidine 0.12 % solution ?Commonly known as: PERIDEX ?15 mLs by Mouth Rinse route 2 (two) times daily. ?  ?clopidogrel 75 MG tablet ?Commonly known as: PLAVIX ?Place 1 tablet (75 mg total) into feeding tube daily. ?What changed: how to take this ?  ?famotidine 40 MG/5ML suspension ?Commonly known as: PEPCID ?Place 2.5 mLs (20 mg total) into feeding tube at bedtime. ?  ?feeding supplement (NEPRO CARB STEADY) Liqd ?Place 315 mLs into feeding tube 3 (three) times daily. ?  ?free  water Soln ?Place 300 mLs into feeding tube 4 (four) times daily. ?  ?furosemide 20 MG tablet ?Commonly known as: LASIX ?1 tablet (20 mg total) by Per J Tube route daily as needed for edema or fluid Rich Number

## 2021-05-30 ENCOUNTER — Ambulatory Visit (INDEPENDENT_AMBULATORY_CARE_PROVIDER_SITE_OTHER): Payer: Medicare Other | Admitting: Cardiology

## 2021-05-30 ENCOUNTER — Ambulatory Visit: Payer: Medicare Other | Admitting: Cardiology

## 2021-05-30 ENCOUNTER — Encounter: Payer: Self-pay | Admitting: Cardiology

## 2021-05-30 VITALS — BP 87/48 | HR 73 | Ht 68.0 in | Wt 132.0 lb

## 2021-05-30 DIAGNOSIS — I2510 Atherosclerotic heart disease of native coronary artery without angina pectoris: Secondary | ICD-10-CM

## 2021-05-30 DIAGNOSIS — Z952 Presence of prosthetic heart valve: Secondary | ICD-10-CM

## 2021-05-30 DIAGNOSIS — Z950 Presence of cardiac pacemaker: Secondary | ICD-10-CM | POA: Diagnosis not present

## 2021-05-30 DIAGNOSIS — I482 Chronic atrial fibrillation, unspecified: Secondary | ICD-10-CM

## 2021-05-30 DIAGNOSIS — I70263 Atherosclerosis of native arteries of extremities with gangrene, bilateral legs: Secondary | ICD-10-CM | POA: Diagnosis not present

## 2021-05-30 DIAGNOSIS — I442 Atrioventricular block, complete: Secondary | ICD-10-CM

## 2021-05-30 DIAGNOSIS — I4811 Longstanding persistent atrial fibrillation: Secondary | ICD-10-CM

## 2021-06-06 ENCOUNTER — Encounter (HOSPITAL_BASED_OUTPATIENT_CLINIC_OR_DEPARTMENT_OTHER): Payer: Medicare Other | Admitting: General Surgery

## 2021-06-07 ENCOUNTER — Encounter (HOSPITAL_BASED_OUTPATIENT_CLINIC_OR_DEPARTMENT_OTHER): Payer: Medicare Other | Attending: General Surgery | Admitting: General Surgery

## 2021-06-07 DIAGNOSIS — E43 Unspecified severe protein-calorie malnutrition: Secondary | ICD-10-CM | POA: Insufficient documentation

## 2021-06-07 DIAGNOSIS — Z931 Gastrostomy status: Secondary | ICD-10-CM | POA: Insufficient documentation

## 2021-06-07 DIAGNOSIS — L97512 Non-pressure chronic ulcer of other part of right foot with fat layer exposed: Secondary | ICD-10-CM | POA: Diagnosis not present

## 2021-06-07 DIAGNOSIS — Z950 Presence of cardiac pacemaker: Secondary | ICD-10-CM | POA: Insufficient documentation

## 2021-06-07 DIAGNOSIS — L89622 Pressure ulcer of left heel, stage 2: Secondary | ICD-10-CM | POA: Diagnosis not present

## 2021-06-07 DIAGNOSIS — L97522 Non-pressure chronic ulcer of other part of left foot with fat layer exposed: Secondary | ICD-10-CM | POA: Diagnosis not present

## 2021-06-07 DIAGNOSIS — I129 Hypertensive chronic kidney disease with stage 1 through stage 4 chronic kidney disease, or unspecified chronic kidney disease: Secondary | ICD-10-CM | POA: Diagnosis not present

## 2021-06-07 DIAGNOSIS — N1831 Chronic kidney disease, stage 3a: Secondary | ICD-10-CM | POA: Diagnosis not present

## 2021-06-07 DIAGNOSIS — E039 Hypothyroidism, unspecified: Secondary | ICD-10-CM | POA: Insufficient documentation

## 2021-06-07 DIAGNOSIS — R1310 Dysphagia, unspecified: Secondary | ICD-10-CM | POA: Insufficient documentation

## 2021-06-07 DIAGNOSIS — I70263 Atherosclerosis of native arteries of extremities with gangrene, bilateral legs: Secondary | ICD-10-CM | POA: Diagnosis not present

## 2021-06-15 ENCOUNTER — Encounter (HOSPITAL_BASED_OUTPATIENT_CLINIC_OR_DEPARTMENT_OTHER): Payer: Medicare Other | Attending: General Surgery | Admitting: General Surgery

## 2021-06-15 ENCOUNTER — Other Ambulatory Visit (HOSPITAL_COMMUNITY): Payer: Self-pay

## 2021-06-15 DIAGNOSIS — I70263 Atherosclerosis of native arteries of extremities with gangrene, bilateral legs: Secondary | ICD-10-CM | POA: Diagnosis present

## 2021-06-15 DIAGNOSIS — N1831 Chronic kidney disease, stage 3a: Secondary | ICD-10-CM | POA: Insufficient documentation

## 2021-06-15 DIAGNOSIS — L97512 Non-pressure chronic ulcer of other part of right foot with fat layer exposed: Secondary | ICD-10-CM | POA: Insufficient documentation

## 2021-06-15 DIAGNOSIS — L97522 Non-pressure chronic ulcer of other part of left foot with fat layer exposed: Secondary | ICD-10-CM | POA: Diagnosis not present

## 2021-06-15 DIAGNOSIS — E43 Unspecified severe protein-calorie malnutrition: Secondary | ICD-10-CM | POA: Diagnosis not present

## 2021-06-15 DIAGNOSIS — I129 Hypertensive chronic kidney disease with stage 1 through stage 4 chronic kidney disease, or unspecified chronic kidney disease: Secondary | ICD-10-CM | POA: Insufficient documentation

## 2021-06-15 DIAGNOSIS — R1310 Dysphagia, unspecified: Secondary | ICD-10-CM | POA: Diagnosis not present

## 2021-06-15 NOTE — Progress Notes (Signed)
Gregory [name], Gregory [name] ([id number]) ?Visit Report for 06/15/2021 ?Arrival Information Details ?Patient Name: Date of Service: ?Gregory NDREWS, Gregory [name] 06/15/2021 1:15 PM ?Medical Record Number: [id number] ?Patient Account Number: [id number] ?Date of Birth/Sex: Treating RN: ?1920-11-03 (88 y.o. Jerilynn Mages) Dellie Catholic ?Primary Care Kveon Casanas: Wylene Simmer Other Clinician: ?Referring Shaeleigh Graw: ?Treating Jerremy Maione/Extender: Fredirick Maudlin ?Wylene Simmer ?Weeks in Treatment: 5 ?Visit Information History Since Last Visit ?Added or deleted any medications: No ?Patient Arrived: Wheel Chair ?Any new allergies or adverse reactions: No ?Arrival Time: 13:42 ?Had Gregory fall or experienced change in No ?Accompanied By: daughter + caregiver ?activities of daily living that may affect ?Transfer Assistance: Manual ?risk of falls: ?Patient Identification Verified: Yes ?Signs or symptoms of abuse/neglect since last visito No ?Patient Requires Transmission-Based Precautions: No ?Hospitalized since last visit: No ?Patient Has Alerts: Yes ?Implantable device outside of the clinic excluding No ?Patient Alerts: ?R ABI: [place] TBI: 0.18 ?cellular tissue based products placed in the center ?L ABI: [place] TBI: 0.0 ?since last visit: ?Has Dressing in Place as Prescribed: Yes ?Pain Present Now: Yes ?Electronic Signature(s) ?Signed: 06/15/2021 5:06:16 PM By: Dellie Catholic RN ?Entered By: Dellie Catholic on 06/15/2021 13:51:17 ?-------------------------------------------------------------------------------- ?Encounter Discharge Information Details ?Patient Name: Date of Service: ?Gregory NDREWS, Gregory [name] 06/15/2021 1:15 PM ?Medical Record Number: [id number] ?Patient Account Number: [id number] ?Date of Birth/Sex: Treating RN: ?[birth year] (88 y.o. Jerilynn Mages) Dellie Catholic ?Primary Care Tyeisha Dinan: Wylene Simmer Other Clinician: ?Referring Addaline Peplinski: ?Treating Fraya Ueda/Extender: Fredirick Maudlin ?Wylene Simmer ?Weeks in Treatment: 5 ?Encounter Discharge Information Items Post Procedure  Vitals ?Discharge Condition: Stable ?Temperature (F): 97.8 ?Ambulatory Status: Wheelchair ?Pulse (bpm): 96 ?Discharge Destination: Home ?Respiratory Rate (breaths/min): 18 ?Transportation: Private Auto ?Blood Pressure (mmHg): 117/76 ?Accompanied By: caregiver+ daughter ?Schedule Follow-up Appointment: Yes ?Clinical Summary of Care: Patient Declined ?Electronic Signature(s) ?Signed: 06/15/2021 5:06:16 PM By: Dellie Catholic RN ?Entered By: Dellie Catholic on 06/15/2021 17:05:55 ?-------------------------------------------------------------------------------- ?Lower Extremity Assessment Details ?Patient Name: ?Date of Service: ?Gregory NDREWS, Gregory [name] 06/15/2021 1:15 PM ?Medical Record Number: [id number] ?Patient Account Number: [id number] ?Date of Birth/Sex: ?Treating RN: ?12/12/1920 (88 y.o. Jerilynn Mages) Dellie Catholic ?Primary Care Curtisha Bendix: Wylene Simmer ?Other Clinician: ?Referring Ligaya Cormier: ?Treating Yacine Droz/Extender: Fredirick Maudlin ?Wylene Simmer ?Weeks in Treatment: 5 ?Edema Assessment ?Assessed: [Left: No] [Right: No] ?E[Left: dema] [Right: :] ?Calf ?Left: Right: ?Point of Measurement: From Medial Instep 31 cm 29.1 cm ?Ankle ?Left: Right: ?Point of Measurement: From Medial Instep 20.5 cm 20 cm ?Electronic Signature(s) ?Signed: 06/15/2021 5:06:16 PM By: Dellie Catholic RN ?Entered By: Dellie Catholic on 06/15/2021 14:09:50 ?-------------------------------------------------------------------------------- ?Multi Wound Chart Details ?Patient Name: ?Date of Service: ?Gregory NDREWS, Gregory [name] 06/15/2021 1:15 PM ?Medical Record Number: [id number] ?Patient Account Number: [id number] ?Date of Birth/Sex: ?Treating RN: ?1920-03-02 (88 y.o. Jerilynn Mages) Dellie Catholic ?Primary Care Philippe Gang: Wylene Simmer ?Other Clinician: ?Referring Zamyah Wiesman: ?Treating Kelsei Defino/Extender: Fredirick Maudlin ?Wylene Simmer ?Weeks in Treatment: 5 ?Vital Signs ?Height(in): ?Pulse(bpm): 96 ?Weight(lbs): ?Blood Pressure(mmHg): 117/76 ?Body Mass  Index(BMI): ?Temperature(??F): 97.8 ?Respiratory Rate(breaths/min): 18 ?Photos: ?Right T Great ?oe Left T Great ?oe Left Calcaneus ?Wound Location: ?Pressure Injury Pressure Injury Pressure Injury ?Wounding Event: ?Arterial Insufficiency Ulcer Arterial Insufficiency Ulcer Pressure Ulcer ?Primary Etiology: ?Arrhythmia, Hypertension Arrhythmia, Hypertension Arrhythmia, Hypertension ?Comorbid History: ?02/12/2021 02/12/2021 02/12/2021 ?Date Acquired: ?'5 5 5 '$ ?Weeks of Treatment: ?Open Open Healed - Epithelialized ?Wound Status: ?No No No ?Wound Recurrence: ?1x1.2x0.1 1x1.1x0.1 0x0x0 ?Measurements L x W x D (cm) ?0.942 0.864 0 ?Gregory (cm?) : ?rea ?0.094 0.086 0 ?Volume (cm?) : ?80.00% 78.00% 100.00% ?% Reduction in Area: ?80.00% 78.10% 100.00% ?%  Reduction in Volume: ?Full Thickness Without Exposed Unclassifiable Category/Stage III ?Classification: ?Support Structures ?Medium None Present None Present ?Exudate Gregory mount: ?Serosanguineous N/Gregory N/Gregory ?Exudate Type: ?red, brown N/Gregory N/Gregory ?Exudate Color: ?Flat and Intact Flat and Intact Flat and Intact ?Wound Margin: ?None Present (0%) None Present (0%) None Present (0%) ?Granulation Gregory mount: ?N/Gregory N/Gregory N/Gregory ?Granulation Quality: ?Large (67-100%) Large (67-100%) None Present (0%) ?Necrotic Gregory mount: ?Eschar Eschar N/Gregory ?Necrotic Tissue: ?Fat Layer (Subcutaneous Tissue): Yes Fat Layer (Subcutaneous Tissue): Yes Fascia: No ?Exposed Structures: ?Fascia: No ?Fascia: No ?Fat Layer (Subcutaneous Tissue): No ?Tendon: No ?Tendon: No ?Tendon: No ?Muscle: No ?Muscle: No ?Muscle: No ?Joint: No ?Joint: No ?Joint: No ?Bone: No ?Bone: No ?Bone: No ?None None Large (67-100%) ?Epithelialization: ?Debridement - Selective/Open Wound Debridement - Selective/Open Wound N/Gregory ?Debridement: ?Pre-procedure Verification/Time Out 14:26 14:26 N/Gregory ?Taken: ?Lidocaine 5% topical ointment Lidocaine 5% topical ointment N/Gregory ?Pain Control: ?Necrotic/Eschar, Callus, Slough Necrotic/Eschar, Callus, Slough N/Gregory ?Tissue  Debrided: ?Non-Viable Tissue Non-Viable Tissue N/Gregory ?Level: ?1.2 1.1 N/Gregory ?Debridement Gregory (sq cm): ?rea ?Curette Curette N/Gregory ?Instrument: ?Minimum Minimum N/Gregory ?Bleeding: ?Pressure Pressure N/Gregory ?Hemostasis Gregory chieved: ?0 0 N/Gregory ?Procedural Pain: ?0 0 N/Gregory ?Post Procedural Pain: ?Procedure was tolerated well Procedure was tolerated well N/Gregory ?Debridement Treatment Response: ?1x1.2x0.1 1x1.1x0.1 N/Gregory ?Post Debridement Measurements L x ?W x D (cm) ?0.094 0.086 N/Gregory ?Post Debridement Volume: (cm?) ?N/Gregory N/Gregory N/Gregory ?Post Debridement Stage: ?Debridement Debridement N/Gregory ?Procedures Performed: ?Wound Number: 4 N/Gregory N/Gregory ?Photos: N/Gregory N/Gregory ?Right, Lateral Foot N/Gregory N/Gregory ?Wound Location: ?Gradually Appeared N/Gregory N/Gregory ?Wounding Event: ?Pressure Ulcer N/Gregory N/Gregory ?Primary Etiology: ?Arrhythmia, Hypertension N/Gregory N/Gregory ?Comorbid History: ?04/12/2021 N/Gregory N/Gregory ?Date Acquired: ?1 N/Gregory N/Gregory ?Weeks of Treatment: ?Open N/Gregory N/Gregory ?Wound Status: ?No N/Gregory N/Gregory ?Wound Recurrence: ?0.3x0.2x0.1 N/Gregory N/Gregory ?Measurements L x W x D (cm) ?0.047 N/Gregory N/Gregory ?Gregory (cm?) : ?rea ?0.005 N/Gregory N/Gregory ?Volume (cm?) : ?0.00% N/Gregory N/Gregory ?% Reduction in Gregory rea: ?0.00% N/Gregory N/Gregory ?% Reduction in Volume: ?Category/Stage II N/Gregory N/Gregory ?Classification: ?Medium N/Gregory N/Gregory ?Exudate Gregory mount: ?Serosanguineous N/Gregory N/Gregory ?Exudate Type: ?red, brown N/Gregory N/Gregory ?Exudate Color: ?Flat and Intact N/Gregory N/Gregory ?Wound Margin: ?Large (67-100%) N/Gregory N/Gregory ?Granulation Gregory mount: ?Red N/Gregory N/Gregory ?Granulation Quality: ?Small (1-33%) N/Gregory N/Gregory ?Necrotic Gregory mount: ?Adherent Slough N/Gregory N/Gregory ?Necrotic Tissue: ?Fat Layer (Subcutaneous Tissue): Yes N/Gregory N/Gregory ?Exposed Structures: ?Fascia: No ?Tendon: No ?Muscle: No ?Joint: No ?Bone: No ?Small (1-33%) N/Gregory N/Gregory ?Epithelialization: ?Debridement - Selective/Open Wound N/Gregory N/Gregory ?Debridement: ?Pre-procedure Verification/Time Out 14:26 N/Gregory N/Gregory ?Taken: ?Lidocaine 5% topical ointment N/Gregory N/Gregory ?Pain Control: ?Necrotic/Eschar, Callus, Slough N/Gregory N/Gregory ?Tissue Debrided: ?Non-Viable Tissue N/Gregory N/Gregory ?Level: ?0.06 N/Gregory N/Gregory ?Debridement Gregory (sq  cm): ?rea ?Curette N/Gregory N/Gregory ?Instrument: ?Minimum N/Gregory N/Gregory ?Bleeding: ?Pressure N/Gregory N/Gregory ?Hemostasis Gregory chieved: ?0 N/Gregory N/Gregory ?Procedural Pain: ?0 N/Gregory N/Gregory ?Post Procedural Pain: ?Procedure was tolerated well N/Gregory N/Gregory ?Debridement Treatment Respon

## 2021-06-16 NOTE — Progress Notes (Signed)
AVID, GUILLETTE ([id number]) ?Visit Report for 06/15/2021 ?Chief Complaint Document Details ?Patient Name: Date of Service: ?A NDREWS, [name] 06/15/2021 1:15 PM ?Medical Record Number: [id number] ?Patient Account Number: [id number] ?Date of Birth/Sex: Treating RN: ?03-17-1920 (88 y.o. Jerilynn Mages) Dellie Catholic ?Primary Care Provider: Wylene Simmer Other Clinician: ?Referring Provider: ?Treating Provider/Extender: Fredirick Maudlin ?Wylene Simmer ?Weeks in Treatment: 5 ?Information Obtained from: Patient ?Chief Complaint ?Patient is at the clinic for treatment of an open pressure ulcer on his left heel and dry gangrene of both great toes ?Electronic Signature(s) ?Signed: 06/15/2021 2:49:58 PM By: Fredirick Maudlin MD FACS ?Entered By: Fredirick Maudlin on 06/15/2021 14:49:58 ?-------------------------------------------------------------------------------- ?Debridement Details ?Patient Name: Date of Service: ?A NDREWS, [name] 06/15/2021 1:15 PM ?Medical Record Number: [id number] ?Patient Account Number: [id number] ?Date of Birth/Sex: Treating RN: ?07-19-1920 (88 y.o. Jerilynn Mages) Dellie Catholic ?Primary Care Provider: Wylene Simmer Other Clinician: ?Referring Provider: ?Treating Provider/Extender: Fredirick Maudlin ?Wylene Simmer ?Weeks in Treatment: 5 ?Debridement Performed for Assessment: Wound #4 Right,Lateral Foot ?Performed By: Physician Fredirick Maudlin, MD ?Debridement Type: Debridement ?Level of Consciousness (Pre-procedure): Awake and Alert ?Pre-procedure Verification/Time Out Yes - 14:26 ?Taken: ?Start Time: 14:26 ?Pain Control: Lidocaine 5% topical ointment ?T Area Debrided (L x W): ?otal 0.3 (cm) x 0.2 (cm) = 0.06 (cm?) ?Tissue and other material debrided: Non-Viable, Callus, Eschar, [place] ?Level: Non-Viable Tissue ?Debridement Description: Selective/Open Wound ?Instrument: Curette ?Bleeding: Minimum ?Hemostasis Achieved: Pressure ?End Time: 14:30 ?Procedural Pain: 0 ?Post Procedural Pain: 0 ?Response to  Treatment: Procedure was tolerated well ?Level of Consciousness (Post- Awake and Alert ?procedure): ?Post Debridement Measurements of Total Wound ?Length: (cm) 0.3 ?Stage: Category/Stage II ?Width: (cm) 0.2 ?Depth: (cm) 0.1 ?Volume: (cm?) 0.005 ?Character of Wound/Ulcer Post Debridement: Improved ?Post Procedure Diagnosis ?Same as Pre-procedure ?Electronic Signature(s) ?Signed: 06/15/2021 5:06:16 PM By: Dellie Catholic RN ?Signed: 06/16/2021 7:39:53 AM By: Fredirick Maudlin MD FACS ?Entered By: Dellie Catholic on 06/15/2021 14:32:52 ?-------------------------------------------------------------------------------- ?Debridement Details ?Patient Name: ?Date of Service: ?A NDREWS, [name] 06/15/2021 1:15 PM ?Medical Record Number: [id number] ?Patient Account Number: [id number] ?Date of Birth/Sex: ?Treating RN: ?[birth year] (88 y.o. Jerilynn Mages) Dellie Catholic ?Primary Care Provider: Wylene Simmer ?Other Clinician: ?Referring Provider: ?Treating Provider/Extender: Fredirick Maudlin ?Wylene Simmer ?Weeks in Treatment: 5 ?Debridement Performed for Assessment: Wound #1 Right T Great ?oe ?Performed By: Physician Fredirick Maudlin, MD ?Debridement Type: Debridement ?Severity of Tissue Pre Debridement: Fat layer exposed ?Level of Consciousness (Pre-procedure): Awake and Alert ?Pre-procedure Verification/Time Out Yes - 14:26 ?Taken: ?Start Time: 14:26 ?Pain Control: Lidocaine 5% topical ointment ?T Area Debrided (L x W): ?otal 1 (cm) x 1.2 (cm) = 1.2 (cm?) ?Tissue and other material debrided: Non-Viable, Callus, Eschar, [place] ?Level: Non-Viable Tissue ?Debridement Description: Selective/Open Wound ?Instrument: Curette ?Bleeding: Minimum ?Hemostasis Achieved: Pressure ?End Time: 14:30 ?Procedural Pain: 0 ?Post Procedural Pain: 0 ?Response to Treatment: Procedure was tolerated well ?Level of Consciousness (Post- Awake and Alert ?procedure): ?Post Debridement Measurements of Total Wound ?Length: (cm) 1 ?Width: (cm) 1.2 ?Depth: (cm)  0.1 ?Volume: (cm?) 0.094 ?Character of Wound/Ulcer Post Debridement: Improved ?Severity of Tissue Post Debridement: Fat layer exposed ?Post Procedure Diagnosis ?Same as Pre-procedure ?Electronic Signature(s) ?Signed: 06/15/2021 5:06:16 PM By: Dellie Catholic RN ?Signed: 06/16/2021 7:39:53 AM By: Fredirick Maudlin MD FACS ?Entered By: Dellie Catholic on 06/15/2021 14:34:59 ?-------------------------------------------------------------------------------- ?Debridement Details ?Patient Name: ?Date of Service: ?A NDREWS, [name] 06/15/2021 1:15 PM ?Medical Record Number: [id number] ?Patient Account Number: [id number] ?Date of Birth/Sex: Treating RN: ?[birth year] (88 y.o. Jerilynn Mages) Dellie Catholic ?Primary Care Provider: Wylene Simmer  Other Clinician: ?Referring Provider: ?Treating Provider/Extender: Fredirick Maudlin ?Wylene Simmer ?Weeks in Treatment: 5 ?Debridement Performed for Assessment: Wound #2 Left T Great ?oe ?Performed By: Physician Fredirick Maudlin, MD ?Debridement Type: Debridement ?Severity of Tissue Pre Debridement: Fat layer exposed ?Level of Consciousness (Pre-procedure): Awake and Alert ?Pre-procedure Verification/Time Out Yes - 14:26 ?Taken: ?Start Time: 14:26 ?Pain Control: Lidocaine 5% topical ointment ?T Area Debrided (L x W): ?otal 1 (cm) x 1.1 (cm) = 1.1 (cm?) ?Tissue and other material debrided: Non-Viable, Callus, Eschar, [place] ?Level: Non-Viable Tissue ?Debridement Description: Selective/Open Wound ?Instrument: Curette ?Bleeding: Minimum ?Hemostasis Achieved: Pressure ?End Time: 14:30 ?Procedural Pain: 0 ?Post Procedural Pain: 0 ?Response to Treatment: Procedure was tolerated well ?Level of Consciousness (Post- Awake and Alert ?procedure): ?Post Debridement Measurements of Total Wound ?Length: (cm) 1 ?Width: (cm) 1.1 ?Depth: (cm) 0.1 ?Volume: (cm?) 0.086 ?Character of Wound/Ulcer Post Debridement: Improved ?Severity of Tissue Post Debridement: Fat layer exposed ?Post Procedure Diagnosis ?Same as  Pre-procedure ?Electronic Signature(s) ?Signed: 06/15/2021 5:06:16 PM By: Dellie Catholic RN ?Signed: 06/16/2021 7:39:53 AM By: Fredirick Maudlin MD FACS ?Entered By: Dellie Catholic on 06/15/2021 14:35:48 ?-------------------------------------------------------------------------------- ?HPI Details ?Patient Name: Date of Service: ?A NDREWS, [name] 06/15/2021 1:15 PM ?Medical Record Number: [id number] ?Patient Account Number: [id number] ?Date of Birth/Sex: Treating RN: ?[birth year] (88 y.o. Jerilynn Mages) Dellie Catholic ?Primary Care Provider: Wylene Simmer Other Clinician: ?Referring Provider: ?Treating Provider/Extender: Fredirick Maudlin ?Wylene Simmer ?Weeks in Treatment: 5 ?History of Present Illness ?HPI Description: ADMISSION ?05/05/2021 ?This is an extremely frail 88 year old man who presents to clinic today accompanied by his son for evaluation of a pressure ulcer on his left heel. He also has ?dry gangrene on the tips of his bilateral great toes and the lateral aspect of both feet. It sounds like after a hospitalization for a GI bleed, he had a blanket on ?his feet which ultimately resulted in a pressure injury. He has severe dysphagia and is dependent on G-tube feeding. The wound on his heel has been present ?since January. He has 24-hour care and they have been applying Xeroform to the heel. He apparently has some sort of air boot that he is meant to wear in bed ?at night. He has severe peripheral arterial disease as evidenced by the ABIs obtained by Dr. Rachelle Hora and copied here: ?ABI Findings: ?+---------+------------------+-----+----------+--------+ ?Right Rt Pressure (mmHg)IndexWaveform Comment  ?+---------+------------------+-----+----------+--------+ ?Brachial 136     ?+---------+------------------+-----+----------+--------+ ?PTA >255 [place] monophasic  ?+---------+------------------+-----+----------+--------+ ?DP >255  monophasic   ?+---------+------------------+-----+----------+--------+ ?Great T oe24 0.18    ?+---------+------------------+-----+----------+--------+ ?+---------+------------------+-----+----------+-------+ ?Left Lt Pressure (mmHg)IndexWaveform Comment ?+---------+------------------+-----+----------+---

## 2021-06-20 NOTE — Progress Notes (Signed)
NATAN, HARTOG ([id number]) ?Visit Report for 06/07/2021 ?Arrival Information Details ?Patient Name: Date of Service: ?Gregory NDREWS, Gregory [name] 06/07/2021 1:15 PM ?Medical Record Number: [id number] ?Patient Account Number: [id number] ?Date of Birth/Sex: Treating RN: ?10-Nov-1920 (88 y.o. [name] ?Primary Care Maisa Bedingfield: Wylene Simmer Other Clinician: ?Referring Norton Bivins: ?Treating Julissa Browning/Extender: Fredirick Maudlin ?Wylene Simmer ?Weeks in Treatment: 4 ?Visit Information History Since Last Visit ?Added or deleted any medications: No ?Patient Arrived: Wheel Chair ?Any new allergies or adverse reactions: No ?Arrival Time: 13:48 ?Had Gregory fall or experienced change in No ?Accompanied By: daughter ?activities of daily living that may affect ?Transfer Assistance: Manual ?risk of falls: ?Patient Identification Verified: Yes ?Signs or symptoms of abuse/neglect since last visito No ?Secondary Verification Process Completed: Yes ?Hospitalized since last visit: Yes ?Patient Requires Transmission-Based Precautions: No ?Implantable device outside of the clinic excluding Yes ?Patient Has Alerts: Yes ?cellular tissue based products placed in the center ?Patient Alerts: ?R ABI: [place] TBI: 0.18 since last visit: ?L ABI: [place] TBI: 0.0 Pain Present Now: No ?Electronic Signature(s) ?Signed: 06/20/2021 8:25:23 AM By: Sharyn Creamer RN, BSN ?Entered By: Sharyn Creamer on 06/07/2021 13:50:30 ?-------------------------------------------------------------------------------- ?Lower Extremity Assessment Details ?Patient Name: Date of Service: ?Gregory NDREWS, Gregory [name] 06/07/2021 1:15 PM ?Medical Record Number: [id number] ?Patient Account Number: [id number] ?Date of Birth/Sex: Treating RN: ?30-Jun-1920 (88 y.o. [name] ?Primary Care Leticia Coletta: Wylene Simmer Other Clinician: ?Referring Venesa Semidey: ?Treating Daelin Haste/Extender: Fredirick Maudlin ?Wylene Simmer ?Weeks in Treatment: 4 ?Edema Assessment ?Assessed: [Left: No] [Right: No] ?[Left:  Edema] [Right: :] ?Calf ?Left: Right: ?Point of Measurement: From Medial Instep 31 cm 29.2 cm ?Ankle ?Left: Right: ?Point of Measurement: From Medial Instep 20.5 cm 20 cm ?Vascular Assessment ?Pulses: ?Dorsalis Pedis ?Palpable: [Left:No] [Right:No] ?Posterior Tibial ?Palpable: [Left:Yes] [Right:Yes] ?Electronic Signature(s) ?Signed: 06/20/2021 8:25:23 AM By: Sharyn Creamer RN, BSN ?Entered By: Sharyn Creamer on 06/07/2021 13:59:05 ?-------------------------------------------------------------------------------- ?Multi Wound Chart Details ?Patient Name: ?Date of Service: ?Gregory NDREWS, Gregory [name] 06/07/2021 1:15 PM ?Medical Record Number: [id number] ?Patient Account Number: [id number] ?Date of Birth/Sex: ?Treating RN: ?October 30, 1920 (88 y.o. Jerilynn Mages) Dellie Catholic ?Primary Care Eimi Viney: Wylene Simmer ?Other Clinician: ?Referring Helyne Genther: ?Treating Armya Westerhoff/Extender: Fredirick Maudlin ?Wylene Simmer ?Weeks in Treatment: 4 ?Vital Signs ?Height(in): ?Pulse(bpm): 76 ?Weight(lbs): ?Blood Pressure(mmHg): 109/49 ?Body Mass Index(BMI): ?Temperature(??F): 97.8 ?Respiratory Rate(breaths/min): 18 ?Photos: ?Right T Great ?oe Left T Great ?oe Left Calcaneus ?Wound Location: ?Pressure Injury Pressure Injury Pressure Injury ?Wounding Event: ?Arterial Insufficiency Ulcer Arterial Insufficiency Ulcer Pressure Ulcer ?Primary Etiology: ?Arrhythmia, Hypertension Arrhythmia, Hypertension Arrhythmia, Hypertension ?Comorbid History: ?02/12/2021 02/12/2021 02/12/2021 ?Date Acquired: ?'4 4 4 '$ ?Weeks of Treatment: ?Open Open Open ?Wound Status: ?No No No ?Wound Recurrence: ?1.2x1.5x0.1 1.5x2x0.1 0.3x0.2x0.1 ?Measurements L x W x D (cm) ?1.414 2.356 0.047 ?Gregory (cm?) : ?rea ?0.141 0.236 0.005 ?Volume (cm?) : ?70.00% 40.00% 97.50% ?% Reduction in Gregory rea: ?70.10% 39.90% 97.30% ?% Reduction in Volume: ?Full Thickness Without Exposed Unclassifiable Category/Stage III ?Classification: ?Support Structures ?None Present None Present Medium ?Exudate Gregory mount: ?N/Gregory N/Gregory  Serosanguineous ?Exudate Type: ?N/Gregory N/Gregory red, brown ?Exudate Color: ?Flat and Intact Flat and Intact Flat and Intact ?Wound Margin: ?None Present (0%) None Present (0%) Large (67-100%) ?Granulation Gregory mount: ?N/Gregory N/Gregory Pink ?Granulation Quality: ?Large (67-100%) Large (67-100%) None Present (0%) ?Necrotic Gregory mount: ?Eschar Eschar N/Gregory ?Necrotic Tissue: ?Fat Layer (Subcutaneous Tissue): Yes Fascia: No Fat Layer (Subcutaneous Tissue): Yes ?Exposed Structures: ?Fascia: No ?Fat Layer (Subcutaneous Tissue): No ?Fascia: No ?Tendon: No ?Tendon: No ?Tendon: No ?Muscle: No ?Muscle: No ?Muscle: No ?Joint:  No ?Joint: No ?Joint: No ?Bone: No ?Bone: No ?Bone: No ?None None Small (1-33%) ?Epithelialization: ?Debridement - Selective/Open Wound Debridement - Selective/Open Wound N/Gregory ?Debridement: ?Pre-procedure Verification/Time Out 14:18 14:18 N/Gregory ?Taken: ?Other Other N/Gregory ?Pain Control: ?Necrotic/Eschar Necrotic/Eschar N/Gregory ?Tissue Debrided: ?Non-Viable Tissue Non-Viable Tissue N/Gregory ?Level: ?1.8 3 N/Gregory ?Debridement Gregory (sq cm): ?rea ?Curette Curette N/Gregory ?Instrument: ?Minimum Minimum N/Gregory ?Bleeding: ?Pressure Pressure N/Gregory ?Hemostasis Achieved: ?3 3 N/Gregory ?Procedural Pain: ?0 0 N/Gregory ?Post Procedural Pain: ?Procedure was tolerated well Procedure was tolerated well N/Gregory ?Debridement Treatment Response: ?1.2x1.5x0.1 1.5x2x0.1 N/Gregory ?Post Debridement Measurements L x ?W x D (cm) ?0.141 0.236 N/Gregory ?Post Debridement Volume: (cm?) ?Debridement Debridement N/Gregory ?Procedures Performed: ?Wound Number: 4 N/Gregory N/Gregory ?Photos: N/Gregory N/Gregory ?Right, Lateral Foot N/Gregory N/Gregory ?Wound Location: ?Gradually Appeared N/Gregory N/Gregory ?Wounding Event: ?Pressure Ulcer N/Gregory N/Gregory ?Primary Etiology: ?Arrhythmia, Hypertension N/Gregory N/Gregory ?Comorbid History: ?04/12/2021 N/Gregory N/Gregory ?Date Acquired: ?0 N/Gregory N/Gregory ?Weeks of Treatment: ?Open N/Gregory N/Gregory ?Wound Status: ?No N/Gregory N/Gregory ?Wound Recurrence: ?0.2x0.3x0.1 N/Gregory N/Gregory ?Measurements L x W x D (cm) ?0.047 N/Gregory N/Gregory ?Gregory (cm?) : ?rea ?0.005 N/Gregory N/Gregory ?Volume (cm?) : ?0.00% N/Gregory N/Gregory ?%  Reduction in Gregory rea: ?0.00% N/Gregory N/Gregory ?% Reduction in Volume: ?Category/Stage II N/Gregory N/Gregory ?Classification: ?Medium N/Gregory N/Gregory ?Exudate Gregory mount: ?Serosanguineous N/Gregory N/Gregory ?Exudate Type: ?red, brown N/Gregory N/Gregory ?Exudate Color: ?Flat and Intact N/Gregory N/Gregory ?Wound Margin: ?Large (67-100%) N/Gregory N/Gregory ?Granulation Gregory mount: ?Red N/Gregory N/Gregory ?Granulation Quality: ?Small (1-33%) N/Gregory N/Gregory ?Necrotic Gregory mount: ?[place] N/Gregory N/Gregory ?Necrotic Tissue: ?Fat Layer (Subcutaneous Tissue): Yes N/Gregory N/Gregory ?Exposed Structures: ?Fascia: No ?Tendon: No ?Muscle: No ?Joint: No ?Bone: No ?Small (1-33%) N/Gregory N/Gregory ?Epithelialization: ?N/Gregory N/Gregory N/Gregory ?Debridement: ?N/Gregory N/Gregory N/Gregory ?Pain Control: ?N/Gregory N/Gregory N/Gregory ?Tissue Debrided: ?N/Gregory N/Gregory N/Gregory ?Level: ?N/Gregory N/Gregory N/Gregory ?Debridement Gregory (sq cm): ?rea ?N/Gregory N/Gregory N/Gregory ?Instrument: ?N/Gregory N/Gregory N/Gregory ?Bleeding: ?N/Gregory N/Gregory N/Gregory ?Hemostasis Gregory chieved: ?N/Gregory N/Gregory N/Gregory ?Procedural Pain: ?N/Gregory N/Gregory N/Gregory ?Post Procedural Pain: ?Debridement Treatment Response: N/Gregory N/Gregory N/Gregory ?Post Debridement Measurements L x N/Gregory N/Gregory N/Gregory ?W x D (cm) ?N/Gregory N/Gregory N/Gregory ?Post Debridement Volume: (cm?) ?N/Gregory N/Gregory N/Gregory ?Procedures Performed: ?Treatment Notes ?Electronic Signature(s) ?Signed: 06/07/2021 2:49:31 PM By: Fredirick Maudlin MD FACS ?Signed: 06/08/2021 5:37:51 PM By: Dellie Catholic RN ?Entered By: Fredirick Maudlin on 06/07/2021 14:49:31 ?-------------------------------------------------------------------------------- ?Multi-Disciplinary Care Plan Details ?Patient Name: ?Date of Service: ?Gregory NDREWS, Gregory [name] 06/07/2021 1:15 PM ?Medical Record Number: [id number] ?Patient Account Number: [id number] ?Date of Birth/Sex: ?Treating RN: ?Feb 11, 1921 (88 y.o. [name] ?Primary Care Trip Cavanagh: Wylene Simmer ?Other Clinician: ?Referring Riona Lahti: ?Treating Eutimio Gharibian/Extender: Fredirick Maudlin ?Wylene Simmer ?Weeks in Treatment: 4 ?Multidisciplinary Care Plan reviewed with physician ?Active Inactive ?Abuse / Safety / Falls / Self Care Management ?Nursing Diagnoses: ?Potential for  falls ?Potential for injury related to falls ?Goals: ?Patient will not experience any injury related to falls ?Date Initiated: 05/05/2021 ?Target Resolution Date: 07/05/2021 ?Goal Status: Active ?Patient/caregiver will verbali

## 2021-06-20 NOTE — Progress Notes (Addendum)
EION, TIMBROOK ([id number]) ?Visit Report for 06/07/2021 ?Chief Complaint Document Details ?Patient Name: Date of Service: ?Gregory [name], Gregory [name] 06/07/2021 1:15 PM ?Medical Record Number: [id number] ?Patient Account Number: [id number] ?Date of Birth/Sex: Treating RN: ?10-09-1920 (88 y.o. Jerilynn Mages) Dellie Catholic ?Primary Care Provider: Wylene Simmer Other Clinician: ?Referring Provider: ?Treating Provider/Extender: Fredirick Maudlin ?Wylene Simmer ?Weeks in Treatment: 4 ?Information Obtained from: Patient ?Chief Complaint ?Patient is at the clinic for treatment of an open pressure ulcer on his left heel and dry gangrene of both great toes ?Electronic Signature(s) ?Signed: 06/07/2021 2:49:43 PM By: Fredirick Maudlin MD FACS ?Entered By: Fredirick Maudlin on 06/07/2021 14:49:43 ?-------------------------------------------------------------------------------- ?Debridement Details ?Patient Name: Date of Service: ?Gregory [name], Gregory [name] 06/07/2021 1:15 PM ?Medical Record Number: [id number] ?Patient Account Number: [id number] ?Date of Birth/Sex: Treating RN: ?06/27/1920 (88 y.o. [name] ?Primary Care Provider: Wylene Simmer Other Clinician: ?Referring Provider: ?Treating Provider/Extender: Fredirick Maudlin ?Wylene Simmer ?Weeks in Treatment: 4 ?Debridement Performed for Assessment: Wound #2 Left T Great ?oe ?Performed By: Physician Fredirick Maudlin, MD ?Debridement Type: Debridement ?Severity of Tissue Pre Debridement: Fat layer exposed ?Level of Consciousness (Pre-procedure): Awake and Alert ?Pre-procedure Verification/Time Out Yes - 14:18 ?Taken: ?Start Time: 14:18 ?Pain Control: ?Other : benzocaine 20%spray ?T Area Debrided (L x W): ?otal 1.5 (cm) x 2 (cm) = 3 (cm?) ?Tissue and other material debrided: Non-Viable, Eschar ?Level: Non-Viable Tissue ?Debridement Description: Selective/Open Wound ?Instrument: Curette ?Bleeding: Minimum ?Hemostasis Achieved: Pressure ?Procedural Pain: 3 ?Post Procedural Pain: 0 ?Response  to Treatment: Procedure was tolerated well ?Level of Consciousness (Post- Awake and Alert ?procedure): ?Post Debridement Measurements of Total Wound ?Length: (cm) 1.5 ?Width: (cm) 2 ?Depth: (cm) 0.1 ?Volume: (cm?) 0.236 ?Character of Wound/Ulcer Post Debridement: Improved ?Severity of Tissue Post Debridement: Fat layer exposed ?Post Procedure Diagnosis ?Same as Pre-procedure ?Electronic Signature(s) ?Signed: 06/07/2021 5:05:56 PM By: Fredirick Maudlin MD FACS ?Signed: 06/20/2021 8:25:23 AM By: Sharyn Creamer RN, BSN ?Entered By: Sharyn Creamer on 06/07/2021 14:25:22 ?-------------------------------------------------------------------------------- ?Debridement Details ?Patient Name: ?Date of Service: ?Gregory [name], Gregory [name] 06/07/2021 1:15 PM ?Medical Record Number: [id number] ?Patient Account Number: [id number] ?Date of Birth/Sex: ?Treating RN: ?[birth year] (88 y.o. [name] ?Primary Care Provider: Wylene Simmer ?Other Clinician: ?Referring Provider: ?Treating Provider/Extender: Fredirick Maudlin ?Wylene Simmer ?Weeks in Treatment: 4 ?Debridement Performed for Assessment: Wound #1 Right T Great ?oe ?Performed By: Physician Fredirick Maudlin, MD ?Debridement Type: Debridement ?Severity of Tissue Pre Debridement: Fat layer exposed ?Level of Consciousness (Pre-procedure): Awake and Alert ?Pre-procedure Verification/Time Out Yes - 14:18 ?Taken: ?Start Time: 14:18 ?Pain Control: ?Other : benzocaine 20%spray ?T Area Debrided (L x W): ?otal 1.2 (cm) x 1.5 (cm) = 1.8 (cm?) ?Tissue and other material debrided: Non-Viable, Eschar ?Level: Non-Viable Tissue ?Debridement Description: Selective/Open Wound ?Instrument: Curette ?Bleeding: Minimum ?Hemostasis Achieved: Pressure ?Procedural Pain: 3 ?Post Procedural Pain: 0 ?Response to Treatment: Procedure was tolerated well ?Level of Consciousness (Post- Awake and Alert ?procedure): ?Post Debridement Measurements of Total Wound ?Length: (cm) 1.2 ?Width: (cm) 1.5 ?Depth: (cm)  0.1 ?Volume: (cm?) 0.141 ?Character of Wound/Ulcer Post Debridement: Improved ?Severity of Tissue Post Debridement: Fat layer exposed ?Post Procedure Diagnosis ?Same as Pre-procedure ?Electronic Signature(s) ?Signed: 06/07/2021 5:05:56 PM By: Fredirick Maudlin MD FACS ?Signed: 06/20/2021 8:25:23 AM By: Sharyn Creamer RN, BSN ?Entered By: Sharyn Creamer on 06/07/2021 14:26:38 ?-------------------------------------------------------------------------------- ?HPI Details ?Patient Name: ?Date of Service: ?Gregory [name], Gregory [name] 06/07/2021 1:15 PM ?Medical Record Number: [id number] ?Patient Account Number: [id number] ?Date of Birth/Sex: Treating RN: ?Jan 25, 1921 (88 y.o. Jerilynn Mages) Dellie Catholic ?  Primary Care Provider: Wylene Simmer Other Clinician: ?Referring Provider: ?Treating Provider/Extender: Fredirick Maudlin ?Wylene Simmer ?Weeks in Treatment: 4 ?History of Present Illness ?HPI Description: ADMISSION ?05/05/2021 ?This is an extremely frail 88 year old man who presents to clinic today accompanied by his son for evaluation of Gregory pressure ulcer on his left heel. He also has ?dry gangrene on the tips of his bilateral great toes and the lateral aspect of both feet. It sounds like after Gregory hospitalization for Gregory GI bleed, he had Gregory blanket on ?his feet which ultimately resulted in Gregory pressure injury. He has severe dysphagia and is dependent on G-tube feeding. The wound on his heel has been present ?since January. He has 24-hour care and they have been applying Xeroform to the heel. He apparently has some sort of air boot that he is meant to wear in bed ?at night. He has severe peripheral arterial disease as evidenced by the ABIs obtained by Dr. Rachelle Hora and copied here: ?ABI Findings: ?+---------+------------------+-----+----------+--------+ ?Right Rt Pressure (mmHg)IndexWaveform Comment  ?+---------+------------------+-----+----------+--------+ ?Brachial 136      ?+---------+------------------+-----+----------+--------+ ?PTA >255 [place] monophasic  ?+---------+------------------+-----+----------+--------+ ?DP >255 [place] monophasic  ?+---------+------------------+-----+----------+--------+ ?Great T oe24 0.18    ?+---------+------------------+-----+----------+--------+ ?+---------+------------------+-----+----------+-------+ ?Left Lt Pressure (mmHg)IndexWaveform Comment ?+---------+------------------+-----+----------+-------+ ?Brachial 136     ?+---------+------------------+-----+----------+-------+ ?PTA >255 [place] monophasic  ?+---------+------------------+-----+----------+-------+ ?DP >255 [place] monophasic  ?+---------+------------------+-----+----------+-------+ ?Great T oe0 0.00 Absent   ?+---------+------------------+-----+----------+-------+ ?+-------+-----------+-----------+------------+------------+ ?ABI/TBIT oday's ABIT oday's TBIPrevious ABIPrevious TBI ?+-------+-----------+-----------+------------+------------+ ?Right [place] 0.18    ?+-------+-----------+-----------+------------+------------+ ?Left  0.00    ?+-------+-----------+-----------+------------+------------+ ?Summary: ?Right: Resting right ankle-brachial index indicates noncompressible right ?lower extremity arteries. The right toe-brachial index is abnormal. ?Left: Resting left ankle-brachial index indicates noncompressible left ?lower extremity arteries. ?Absent left great toe PPG waveform. ?He saw Dr. Rachelle Hora on March 16 and at that time, he was felt to be far too frail to undergo any sort of intervention including an aortogram. Certainly not Gregory ?surgical candidate. According to the electronic medical record, Dr. Rachelle Hora is planning to see him back in Gregory couple of weeks and will determine if he has made ?any significant improvement in his overall status and might be suitable for an intervention. I read his note to suggest that the patient has had  multiple ?complications from other procedures and that even an aortogram may be too risky. ?05/12/2021: Rather remarkably, the eschar and dry gangrene on his bilateral great toes and bilateral fifth metatarsals actually looks better. Some of the eschar ?on his toes is peeling away to reveal healing tissue underneath. This is also the case with his

## 2021-06-22 ENCOUNTER — Encounter (HOSPITAL_BASED_OUTPATIENT_CLINIC_OR_DEPARTMENT_OTHER): Payer: Medicare Other | Admitting: General Surgery

## 2021-06-23 ENCOUNTER — Encounter (HOSPITAL_BASED_OUTPATIENT_CLINIC_OR_DEPARTMENT_OTHER): Payer: Medicare Other | Admitting: General Surgery

## 2021-06-23 DIAGNOSIS — I70263 Atherosclerosis of native arteries of extremities with gangrene, bilateral legs: Secondary | ICD-10-CM | POA: Diagnosis not present

## 2021-06-23 NOTE — Progress Notes (Signed)
KIJUAN, GALLICCHIO ([id number]) ?Visit Report for 06/23/2021 ?Arrival Information Details ?Patient Name: Date of Service: ?Gregory NDREWS, Gregory [name] 06/23/2021 1:15 PM ?Medical Record Number: [id number] ?Patient Account Number: [id number] ?Date of Birth/Sex: Treating RN: ?10-25-1920 (88 y.o. Janyth Contes ?Primary Care Tahara Ruffini: Wylene Simmer Other Clinician: ?Referring Lakenya Riendeau: ?Treating Emitt Maglione/Extender: Fredirick Maudlin ?Wylene Simmer ?Weeks in Treatment: 7 ?Visit Information History Since Last Visit ?Added or deleted any medications: No ?Patient Arrived: Wheel Chair ?Any new allergies or adverse reactions: No ?Arrival Time: 13:57 ?Had Gregory fall or experienced change in No ?Accompanied By: caregivers ?activities of daily living that may affect ?Transfer Assistance: Manual ?risk of falls: ?Patient Identification Verified: Yes ?Signs or symptoms of abuse/neglect since last visito No ?Secondary Verification Process Completed: Yes ?Hospitalized since last visit: No ?Patient Requires Transmission-Based Precautions: No ?Implantable device outside of the clinic excluding No ?Patient Has Alerts: Yes ?cellular tissue based products placed in the center ?Patient Alerts: ?R ABI: [place] TBI: 0.18 since last visit: ?L ABI: [place] TBI: 0.0 Has Dressing in Place as Prescribed: Yes ?Pain Present Now: No ?Electronic Signature(s) ?Signed: 06/23/2021 5:54:45 PM By: Levan Hurst RN, BSN ?Entered By: Levan Hurst on 06/23/2021 13:57:55 ?-------------------------------------------------------------------------------- ?Encounter Discharge Information Details ?Patient Name: Date of Service: ?Gregory NDREWS, Gregory [name] 06/23/2021 1:15 PM ?Medical Record Number: [id number] ?Patient Account Number: [id number] ?Date of Birth/Sex: Treating RN: ?1921/01/27 (88 y.o. Jerilynn Mages) Dellie Catholic ?Primary Care Darlene Bartelt: Wylene Simmer Other Clinician: ?Referring Joedy Eickhoff: ?Treating Latoshia Monrroy/Extender: Fredirick Maudlin ?Wylene Simmer ?Weeks in Treatment: 7 ?Encounter  Discharge Information Items Post Procedure Vitals ?Discharge Condition: Stable ?Temperature (F): 97.7 ?Ambulatory Status: Wheelchair ?Pulse (bpm): 77 ?Discharge Destination: Home ?Respiratory Rate (breaths/min): 16 ?Transportation: Private Auto ?Blood Pressure (mmHg): 119/67 ?Accompanied By: caregiver+ daughter ?Schedule Follow-up Appointment: Yes ?Clinical Summary of Care: Patient Declined ?Electronic Signature(s) ?Signed: 06/23/2021 5:20:01 PM By: Dellie Catholic RN ?Entered By: Dellie Catholic on 06/23/2021 17:19:38 ?-------------------------------------------------------------------------------- ?Lower Extremity Assessment Details ?Patient Name: ?Date of Service: ?Gregory NDREWS, Gregory [name] 06/23/2021 1:15 PM ?Medical Record Number: [id number] ?Patient Account Number: [id number] ?Date of Birth/Sex: ?Treating RN: ?04/09/1920 (88 y.o. Janyth Contes ?Primary Care Alon Mazor: Wylene Simmer ?Other Clinician: ?Referring Lehi Phifer: ?Treating Langley Flatley/Extender: Fredirick Maudlin ?Wylene Simmer ?Weeks in Treatment: 7 ?Edema Assessment ?Assessed: [Left: No] [Right: No] ?E[Left: dema] [Right: :] ?Calf ?Left: Right: ?Point of Measurement: From Medial Instep 31 cm 29.1 cm ?Ankle ?Left: Right: ?Point of Measurement: From Medial Instep 20.5 cm 20 cm ?Vascular Assessment ?Pulses: ?Dorsalis Pedis ?Palpable: [Left:Yes] [Right:Yes] ?Electronic Signature(s) ?Signed: 06/23/2021 5:54:45 PM By: Levan Hurst RN, BSN ?Entered By: Levan Hurst on 06/23/2021 14:08:05 ?-------------------------------------------------------------------------------- ?Multi Wound Chart Details ?Patient Name: ?Date of Service: ?Gregory NDREWS, Gregory [name] 06/23/2021 1:15 PM ?Medical Record Number: [id number] ?Patient Account Number: [id number] ?Date of Birth/Sex: ?Treating RN: ?09-05-1920 (88 y.o. Jerilynn Mages) Dellie Catholic ?Primary Care Brynnleigh Mcelwee: Wylene Simmer ?Other Clinician: ?Referring Jove Beyl: ?Treating Kjirsten Bloodgood/Extender: Fredirick Maudlin ?Wylene Simmer ?Weeks in  Treatment: 7 ?Vital Signs ?Height(in): ?Pulse(bpm): 77 ?Weight(lbs): ?Blood Pressure(mmHg): 119/67 ?Body Mass Index(BMI): ?Temperature(??F): 97.7 ?Respiratory Rate(breaths/min): 16 ?Photos: [1:Right T Great oe] [2:Left T Great oe] [4:Right, Lateral Foot] ?Wound Location: [1:Pressure Injury] [2:Pressure Injury] [4:Gradually Appeared] ?Wounding Event: [1:Arterial Insufficiency Ulcer] [2:Arterial Insufficiency Ulcer] [4:Pressure Ulcer] ?Primary Etiology: [1:Arrhythmia, Hypertension] [2:Arrhythmia, Hypertension] [4:Arrhythmia, Hypertension] ?Comorbid History: [1:02/12/2021] [2:02/12/2021] [4:04/12/2021] ?Date Acquired: [1:7] [2:7] [4:2] ?Weeks of Treatment: [1:Open] [2:Open] [4:Open] ?Wound Status: [1:No] [2:No] [4:No] ?Wound Recurrence: [1:1x1.5x0.1] [2:1.2x1.5x0.1] [4:0.6x0.6x0.1] ?Measurements L x W x D (cm) [1:1.178] [2:1.414] [4:0.283] ?Gregory (cm?) : ?rea [1:0.118] [2:0.141] [4:0.028] ?Volume (cm?) : [  1:75.00%] [2:64.00%] [4:-502.10%] ?% Reduction in Gregory rea: [1:74.90%] [2:64.10%] [4:-460.00%] ?% Reduction in Volume: [1:Full Thickness Without Exposed] [2:Unclassifiable] [4:Category/Stage II] ?Classification: [1:Support Structures Medium] [2:Small] [4:Small] ?Exudate Gregory mount: [1:Serosanguineous] [2:Serosanguineous] [4:Serosanguineous] ?Exudate Type: [1:red, brown] [2:red, brown] [4:red, brown] ?Exudate Color: [1:Flat and Intact] [2:Flat and Intact] [4:Flat and Intact] ?Wound Margin: [1:None Present (0%)] [2:None Present (0%)] [4:None Present (0%)] ?Granulation Gregory mount: [1:N/Gregory] [2:N/Gregory] [4:N/Gregory] ?Granulation Quality: [1:Large (67-100%)] [2:Large (67-100%)] [4:Large (67-100%)] ?Necrotic Gregory mount: [1:Eschar] [2:Eschar] [4:Eschar] ?Necrotic Tissue: ?[1:Fat Layer (Subcutaneous Tissue): Yes Fat Layer (Subcutaneous Tissue): Yes Fat Layer (Subcutaneous Tissue): Yes] ?Exposed Structures: ?[1:Fascia: No Tendon: No Muscle: No Joint: No Bone: No None] [2:Fascia: No Tendon: No Muscle: No Joint: No Bone: No None] [4:Fascia: No Tendon: No  Muscle: No Joint: No Bone: No Small (1-33%)] ?Epithelialization: [1:Debridement - Selective/Open Wound Debridement - Selective/Open Wound Debridement - Selective/Open Wound] ?Debridement: ?Pre-procedure Verification/Time Out 14:18 [2:14:18] [4:14:18] ?Taken: [1:Other] [2:Other] [4:Other] ?Pain Control: [1:Necrotic/Eschar] [2:Necrotic/Eschar] [4:Necrotic/Eschar, Slough] ?Tissue Debrided: [1:Non-Viable Tissue] [2:Non-Viable Tissue] [4:Non-Viable Tissue] ?Level: [1:1.5] [2:1.8] [4:0.36] ?Debridement Gregory (sq cm): [1:rea Curette] [2:Curette] [4:Curette] ?Instrument: [1:Minimum] [2:Minimum] [4:Minimum] ?Bleeding: [1:Pressure] [2:Pressure] [4:Pressure] ?Hemostasis Gregory chieved: [1:0] [2:0] [4:0] ?Procedural Pain: [1:0] [2:0] [4:0] ?Post Procedural Pain: [1:Procedure was tolerated well] [2:Procedure was tolerated well] [4:Procedure was tolerated well] ?Debridement Treatment Response: [1:1x1.5x0.1] [2:1.2x1.5x0.1] [4:0.6x0.6x0.1] ?Post Debridement Measurements L x ?W x D (cm) [1:0.118] [2:0.141] [4:0.028] ?Post Debridement Volume: (cm?) [1:N/Gregory] [2:N/Gregory] [4:Category/Stage II] ?Post Debridement Stage: [1:Debridement] [2:Debridement] [4:Debridement] ?Wound Number: 5 N/Gregory N/Gregory ?Photos: No Photos N/Gregory N/Gregory ?Right, Medial T Great ?oe N/Gregory N/Gregory ?Wound Location: ?Blister N/Gregory N/Gregory ?Wounding Event: ?Pressure Ulcer N/Gregory N/Gregory ?Primary Etiology: ?Arrhythmia, Hypertension N/Gregory N/Gregory ?Comorbid History: ?06/12/2021 N/Gregory N/Gregory ?Date Acquired: ?0 N/Gregory N/Gregory ?Weeks of Treatment: ?Open N/Gregory N/Gregory ?Wound Status: ?No N/Gregory N/Gregory ?Wound Recurrence: ?0.3x0.3x0.1 N/Gregory N/Gregory ?Measurements L x W x D (cm) ?0.071 N/Gregory N/Gregory ?Gregory (cm?) : ?rea ?0.007 N/Gregory N/Gregory ?Volume (cm?) : ?0.00% N/Gregory N/Gregory ?% Reduction in Gregory rea: ?0.00% N/Gregory N/Gregory ?% Reduction in Volume: ?Category/Stage II N/Gregory N/Gregory ?Classification: ?Medium N/Gregory N/Gregory ?Exudate Gregory mount: ?Serosanguineous N/Gregory N/Gregory ?Exudate Type: ?red, brown N/Gregory N/Gregory ?Exudate Color: ?N/Gregory N/Gregory N/Gregory ?Wound Margin: ?Small (1-33%) N/Gregory N/Gregory ?Granulation Gregory mount: ?Red N/Gregory  N/Gregory ?Granulation Quality: ?Large (67-100%) N/Gregory N/Gregory ?Necrotic Gregory mount: ?Eschar, Adherent Slough N/Gregory N/Gregory ?Necrotic Tissue: ?Fat Layer (Subcutaneous Tissue): Yes N/Gregory N/Gregory ?Exposed Structures: ?Fascia: No ?Tendon: No ?Muscle: No ?Joi

## 2021-06-23 NOTE — Progress Notes (Signed)
JAION, LAGRANGE ([id number]) ?Visit Report for 06/23/2021 ?Chief Complaint Document Details ?Patient Name: Date of Service: ?A NDREWS, [name] 06/23/2021 1:15 PM ?Medical Record Number: [id number] ?Patient Account Number: [id number] ?Date of Birth/Sex: Treating RN: ?1920/12/15 (88 y.o. Gregory [name]) Dellie Catholic ?Primary Care Provider: Wylene Simmer Other Clinician: ?Referring Provider: ?Treating Provider/Extender: Fredirick Maudlin ?Wylene Simmer ?Weeks in Treatment: 7 ?Information Obtained from: Patient ?Chief Complaint ?Patient is at the clinic for treatment of an open pressure ulcer on his left heel and dry gangrene of both great toes ?Electronic Signature(s) ?Signed: 06/23/2021 2:36:03 PM By: Fredirick Maudlin MD FACS ?Entered By: Fredirick Maudlin on 06/23/2021 14:36:03 ?-------------------------------------------------------------------------------- ?Debridement Details ?Patient Name: Date of Service: ?A NDREWS, [name] 06/23/2021 1:15 PM ?Medical Record Number: [id number] ?Patient Account Number: [id number] ?Date of Birth/Sex: Treating RN: ?07-19-1920 (88 y.o. Gregory [name]) Dellie Catholic ?Primary Care Provider: Wylene Simmer Other Clinician: ?Referring Provider: ?Treating Provider/Extender: Fredirick Maudlin ?Wylene Simmer ?Weeks in Treatment: 7 ?Debridement Performed for Assessment: Wound #1 Right T Great ?oe ?Performed By: Physician Fredirick Maudlin, MD ?Debridement Type: Debridement ?Severity of Tissue Pre Debridement: Fat layer exposed ?Level of Consciousness (Pre-procedure): Awake and Alert ?Pre-procedure Verification/Time Out Yes - 14:18 ?Taken: ?Start Time: 14:18 ?Pain Control: ?Other : Benzocaine ?T Area Debrided (L x W): ?otal 1 (cm) x 1.5 (cm) = 1.5 (cm?) ?Tissue and other material debrided: Non-Viable, Eschar ?Level: Non-Viable Tissue ?Debridement Description: Selective/Open Wound ?Instrument: Curette ?Bleeding: Minimum ?Hemostasis Achieved: Pressure ?End Time: 14:20 ?Procedural Pain: 0 ?Post Procedural Pain:  0 ?Response to Treatment: Procedure was tolerated well ?Level of Consciousness (Post- Awake and Alert ?procedure): ?Post Debridement Measurements of Total Wound ?Length: (cm) 1 ?Width: (cm) 1.5 ?Depth: (cm) 0.1 ?Volume: (cm?) 0.118 ?Character of Wound/Ulcer Post Debridement: Improved ?Severity of Tissue Post Debridement: Fat layer exposed ?Post Procedure Diagnosis ?Same as Pre-procedure ?Electronic Signature(s) ?Signed: 06/23/2021 3:00:49 PM By: Fredirick Maudlin MD FACS ?Signed: 06/23/2021 5:20:01 PM By: Dellie Catholic RN ?Entered By: Dellie Catholic on 06/23/2021 14:21:52 ?-------------------------------------------------------------------------------- ?Debridement Details ?Patient Name: ?Date of Service: ?A NDREWS, [name] 06/23/2021 1:15 PM ?Medical Record Number: [id number] ?Patient Account Number: [id number] ?Date of Birth/Sex: ?Treating RN: ?1920-08-25 (88 y.o. Gregory [name]) Dellie Catholic ?Primary Care Provider: Wylene Simmer ?Other Clinician: ?Referring Provider: ?Treating Provider/Extender: Fredirick Maudlin ?Wylene Simmer ?Weeks in Treatment: 7 ?Debridement Performed for Assessment: Wound #2 Left T Great ?oe ?Performed By: Physician Fredirick Maudlin, MD ?Debridement Type: Debridement ?Severity of Tissue Pre Debridement: Fat layer exposed ?Level of Consciousness (Pre-procedure): Awake and Alert ?Pre-procedure Verification/Time Out Yes - 14:18 ?Taken: ?Start Time: 14:18 ?Pain Control: ?Other : Benzocaine ?T Area Debrided (L x W): ?otal 1.2 (cm) x 1.5 (cm) = 1.8 (cm?) ?Tissue and other material debrided: Non-Viable, Eschar ?Level: Non-Viable Tissue ?Debridement Description: Selective/Open Wound ?Instrument: Curette ?Bleeding: Minimum ?Hemostasis Achieved: Pressure ?End Time: 14:20 ?Procedural Pain: 0 ?Post Procedural Pain: 0 ?Response to Treatment: Procedure was tolerated well ?Level of Consciousness (Post- Awake and Alert ?procedure): ?Post Debridement Measurements of Total Wound ?Length: (cm) 1.2 ?Width: (cm)  1.5 ?Depth: (cm) 0.1 ?Volume: (cm?) 0.141 ?Character of Wound/Ulcer Post Debridement: Improved ?Severity of Tissue Post Debridement: Fat layer exposed ?Post Procedure Diagnosis ?Same as Pre-procedure ?Electronic Signature(s) ?Signed: 06/23/2021 3:00:49 PM By: Fredirick Maudlin MD FACS ?Signed: 06/23/2021 5:20:01 PM By: Dellie Catholic RN ?Entered By: Dellie Catholic on 06/23/2021 14:22:45 ?-------------------------------------------------------------------------------- ?Debridement Details ?Patient Name: ?Date of Service: ?A NDREWS, [name] 06/23/2021 1:15 PM ?Medical Record Number: [id number] ?Patient Account Number: [id number] ?Date of Birth/Sex: ?Treating RN: ?April 28, 1920 (88 y.o. M)  [place], Mechele Claude ?Primary Care Provider: Wylene Simmer ?Other Clinician: ?Referring Provider: ?Treating Provider/Extender: Fredirick Maudlin ?Wylene Simmer ?Weeks in Treatment: 7 ?Debridement Performed for Assessment: Wound #4 Right,Lateral Foot ?Performed By: Physician Fredirick Maudlin, MD ?Debridement Type: Debridement ?Level of Consciousness (Pre-procedure): Awake and Alert ?Pre-procedure Verification/Time Out Yes - 14:18 ?Taken: ?Start Time: 14:18 ?Pain Control: ?Other : Benzocaine ?T Area Debrided (L x W): ?otal 0.6 (cm) x 0.6 (cm) = 0.36 (cm?) ?Tissue and other material debrided: Non-Viable, Eschar, [place], [place] ?Level: Non-Viable Tissue ?Debridement Description: Selective/Open Wound ?Instrument: Curette ?Bleeding: Minimum ?Hemostasis Achieved: Pressure ?End Time: 14:20 ?Procedural Pain: 0 ?Post Procedural Pain: 0 ?Response to Treatment: Procedure was tolerated well ?Level of Consciousness (Post- Awake and Alert ?procedure): ?Post Debridement Measurements of Total Wound ?Length: (cm) 0.6 ?Stage: Category/Stage II ?Width: (cm) 0.6 ?Depth: (cm) 0.1 ?Volume: (cm?) 0.028 ?Character of Wound/Ulcer Post Debridement: Improved ?Post Procedure Diagnosis ?Same as Pre-procedure ?Electronic Signature(s) ?Signed: 06/23/2021 3:00:49 PM By:  Fredirick Maudlin MD FACS ?Signed: 06/23/2021 5:20:01 PM By: Dellie Catholic RN ?Entered By: Dellie Catholic on 06/23/2021 14:24:08 ?-------------------------------------------------------------------------------- ?Debridement Details ?Patient Name: ?Date of Service: ?A NDREWS, [name] 06/23/2021 1:15 PM ?Medical Record Number: [id number] ?Patient Account Number: [id number] ?Date of Birth/Sex: ?Treating RN: ?10-06-1920 (88 y.o. Gregory [name]) Dellie Catholic ?Primary Care Provider: Wylene Simmer ?Other Clinician: ?Referring Provider: ?Treating Provider/Extender: Fredirick Maudlin ?Wylene Simmer ?Weeks in Treatment: 7 ?Debridement Performed for Assessment: Wound #5 Right,Medial T Great ?oe ?Performed By: Physician Fredirick Maudlin, MD ?Debridement Type: Debridement ?Level of Consciousness (Pre-procedure): Awake and Alert ?Pre-procedure Verification/Time Out Yes - 14:18 ?Taken: ?Start Time: 14:18 ?Pain Control: ?Other : Benzocaine ?T Area Debrided (L x W): ?otal 0.3 (cm) x 0.3 (cm) = 0.09 (cm?) ?Tissue and other material debrided: Non-Viable, Eschar ?Level: Non-Viable Tissue ?Debridement Description: Selective/Open Wound ?Instrument: Curette ?Bleeding: Minimum ?Hemostasis Achieved: Pressure ?End Time: 14:20 ?Procedural Pain: 0 ?Post Procedural Pain: 0 ?Response to Treatment: Procedure was tolerated well ?Level of Consciousness (Post- Awake and Alert ?procedure): ?Post Debridement Measurements of Total Wound ?Length: (cm) 0.3 ?Stage: Category/Stage II ?Width: (cm) 0.3 ?Depth: (cm) 0.1 ?Volume: (cm?) 0.007 ?Character of Wound/Ulcer Post Debridement: Improved ?Post Procedure Diagnosis ?Same as Pre-procedure ?Electronic Signature(s) ?Signed: 06/23/2021 3:00:49 PM By: Fredirick Maudlin MD FACS ?Signed: 06/23/2021 5:20:01 PM By: Dellie Catholic RN ?Entered By: Dellie Catholic on 06/23/2021 14:28:10 ?-------------------------------------------------------------------------------- ?HPI Details ?Patient Name: Date of Service: ?A NDREWS, A  RNO LD 06/23/2021 1:15 PM ?Medical Record Number: [id number] ?Patient Account Number: [id number] ?Date of Birth/Sex: Treating RN: ?[birth year] (88 y.o. Gregory [name]) Dellie Catholic ?Primary Care Provider: Mariane Duval

## 2021-06-29 ENCOUNTER — Ambulatory Visit (HOSPITAL_BASED_OUTPATIENT_CLINIC_OR_DEPARTMENT_OTHER): Payer: Medicare Other | Admitting: General Surgery

## 2021-06-29 MED ORDER — ACETAMINOPHEN 325 MG PO TABS: 650.0000 mg | ORAL_TABLET | Freq: Once | ORAL | Status: DC

## 2021-06-30 ENCOUNTER — Ambulatory Visit
Admission: RE | Admit: 2021-06-30 | Discharge: 2021-06-30 | Disposition: A | Discharge status: Home / Self Care | Payer: Medicare Other | Source: Ambulatory Visit | Attending: Internal Medicine | Admitting: Internal Medicine

## 2021-06-30 ENCOUNTER — Encounter (HOSPITAL_BASED_OUTPATIENT_CLINIC_OR_DEPARTMENT_OTHER): Payer: Medicare Other | Admitting: General Surgery

## 2021-06-30 VITALS — BP 135/63 | HR 70 | Temp 97.8°F | Resp 16 | Ht 70.0 in | Wt 145.0 lb

## 2021-06-30 DIAGNOSIS — N1831 Chronic kidney disease, stage 3a: Secondary | ICD-10-CM

## 2021-06-30 DIAGNOSIS — N183 Chronic kidney disease, stage 3 unspecified: Secondary | ICD-10-CM | POA: Diagnosis not present

## 2021-06-30 DIAGNOSIS — D631 Anemia in chronic kidney disease: Secondary | ICD-10-CM | POA: Insufficient documentation

## 2021-06-30 DIAGNOSIS — D649 Anemia, unspecified: Secondary | ICD-10-CM

## 2021-06-30 LAB — HEMOGLOBIN AND HEMATOCRIT, BLOOD
HCT: 22.6 % — ABNORMAL LOW (ref 39.0–52.0)
HCT: 25.3 % — ABNORMAL LOW (ref 39.0–52.0)
Hemoglobin: 7.1 g/dL — ABNORMAL LOW (ref 13.0–17.0)
Hemoglobin: 8 g/dL — ABNORMAL LOW (ref 13.0–17.0)

## 2021-06-30 LAB — PREPARE RBC (CROSSMATCH)

## 2021-06-30 MED ORDER — SODIUM CHLORIDE FLUSH 0.9 % IV SOLN
INTRAVENOUS | Status: AC
  Filled 2021-06-30: qty 10

## 2021-06-30 MED ORDER — FUROSEMIDE 10 MG/ML IJ SOLN
INTRAMUSCULAR | Status: AC
  Administered 2021-06-30: 20 mg via INTRAVENOUS
  Filled 2021-06-30: qty 2

## 2021-06-30 MED ORDER — ACETAMINOPHEN 325 MG PO TABS: 650.0000 mg | ORAL_TABLET | Freq: Once | ORAL | Status: AC

## 2021-06-30 MED ORDER — METHYLPREDNISOLONE SODIUM SUCC 125 MG IJ SOLR: 125.0000 mg | Freq: Once | INTRAMUSCULAR | Status: AC

## 2021-06-30 MED ORDER — ACETAMINOPHEN 325 MG PO TABS
ORAL_TABLET | ORAL | Status: AC
  Administered 2021-06-30: 650 mg via ORAL
  Filled 2021-06-30: qty 2

## 2021-06-30 MED ORDER — FUROSEMIDE 10 MG/ML IJ SOLN: 20.0000 mg | Freq: Once | INTRAMUSCULAR | Status: AC

## 2021-06-30 MED ORDER — SODIUM CHLORIDE 0.9% IV SOLUTION: Freq: Once | INTRAVENOUS | Status: AC

## 2021-06-30 MED ORDER — METHYLPREDNISOLONE SODIUM SUCC 125 MG IJ SOLR
INTRAMUSCULAR | Status: AC
  Administered 2021-06-30: 125 mg via INTRAVENOUS
  Filled 2021-06-30: qty 2

## 2021-07-01 LAB — BPAM RBC
Blood Product Expiration Date: 202305242359
Blood Product Expiration Date: 202306142359
ISSUE DATE / TIME: 202305190955
ISSUE DATE / TIME: 202305191452
Unit Type and Rh: 600
Unit Type and Rh: 6200

## 2021-07-01 LAB — TYPE AND SCREEN
ABO/RH(D): A POS
Antibody Screen: NEGATIVE
Unit division: 0
Unit division: 0

## 2021-07-04 ENCOUNTER — Inpatient Hospital Stay (HOSPITAL_COMMUNITY): Admission: RE | Admit: 2021-07-04 | Payer: Medicare Other | Source: Ambulatory Visit

## 2021-07-04 ENCOUNTER — Ambulatory Visit (INDEPENDENT_AMBULATORY_CARE_PROVIDER_SITE_OTHER): Payer: Medicare Other

## 2021-07-04 DIAGNOSIS — I442 Atrioventricular block, complete: Secondary | ICD-10-CM | POA: Diagnosis not present

## 2021-07-05 LAB — CUP PACEART REMOTE DEVICE CHECK
Battery Remaining Longevity: 122 mo
Battery Voltage: 3.02 V
Brady Statistic RV Percent Paced: 99.97 %
Date Time Interrogation Session: 20230523020556
Implantable Lead Implant Date: 20021114
Implantable Lead Location: 753860
Implantable Lead Model: 5076
Implantable Pulse Generator Implant Date: 20200506
Lead Channel Impedance Value: 361 Ohm
Lead Channel Impedance Value: 437 Ohm
Lead Channel Pacing Threshold Amplitude: 0.875 V
Lead Channel Pacing Threshold Pulse Width: 0.4 ms
Lead Channel Sensing Intrinsic Amplitude: 8.25 mV
Lead Channel Sensing Intrinsic Amplitude: 8.25 mV
Lead Channel Setting Pacing Amplitude: 2 V
Lead Channel Setting Pacing Pulse Width: 0.4 ms
Lead Channel Setting Sensing Sensitivity: 0.9 mV

## 2021-07-06 ENCOUNTER — Encounter (HOSPITAL_BASED_OUTPATIENT_CLINIC_OR_DEPARTMENT_OTHER): Payer: Medicare Other | Admitting: General Surgery

## 2021-07-06 DIAGNOSIS — I70263 Atherosclerosis of native arteries of extremities with gangrene, bilateral legs: Secondary | ICD-10-CM | POA: Diagnosis not present

## 2021-07-06 NOTE — Progress Notes (Signed)
IOANNIS, SCHUH ([id number]) Visit Report for 07/06/2021 Arrival Information Details Patient Name: Date of Service: Gregory [name] [facility] LD 07/06/2021 12:30 PM Medical Record Number: [id number] Patient Account Number: [id number] Date of Birth/Sex: Treating RN: 1920-12-09 (89 y.o. Janyth Contes Primary Care Riya Huxford: Wylene Simmer Other Clinician: Referring Rona Tomson: Treating Elia Keenum/Extender: Roxy Cedar in Treatment: 8 Visit Information History Since Last Visit Added or deleted any medications: No Patient Arrived: Wheel Chair Any new allergies or adverse reactions: No Arrival Time: 12:53 Had a fall or experienced change in No Accompanied By: dtr and cg activities of daily living that may affect Transfer Assistance: None risk of falls: Patient Identification Verified: Yes Signs or symptoms of abuse/neglect since last visito No Secondary Verification Process Completed: Yes Hospitalized since last visit: No Patient Requires Transmission-Based Precautions: No Implantable device outside of the clinic excluding No Patient Has Alerts: Yes cellular tissue based products placed in the center Patient Alerts: R ABI: [place] TBI: 0.18 since last visit: L ABI: [place] TBI: 0.0 Has Dressing in Place as Prescribed: Yes Pain Present Now: No Electronic Signature(s) Signed: 07/06/2021 5:38:25 PM By: Levan Hurst RN, BSN Entered By: Levan Hurst on 07/06/2021 12:55:00 -------------------------------------------------------------------------------- Encounter Discharge Information Details Patient Name: Date of Service: [name], [name] 07/06/2021 12:30 PM Medical Record Number: [id number] Patient Account Number: [id number] Date of Birth/Sex: Treating RN: [birth year] (88 y.o. [name] Primary Care Trameka Dorough: Wylene Simmer Other Clinician: Referring Cagney Degrace: Treating Tammera Engert/Extender: Roxy Cedar in Treatment: 8 Encounter  Discharge Information Items Post Procedure Vitals Discharge Condition: Stable Temperature (F): 97.7 Ambulatory Status: Wheelchair Pulse (bpm): 79 Discharge Destination: Home Respiratory Rate (breaths/min): 16 Transportation: Private Auto Blood Pressure (mmHg): 127/56 Accompanied By: caregiver/friend and daughter Schedule Follow-up Appointment: Yes Clinical Summary of Care: Patient Declined Electronic Signature(s) Signed: 07/06/2021 6:59:07 PM By: Dellie Catholic RN Entered By: Dellie Catholic on 07/06/2021 17:53:33 -------------------------------------------------------------------------------- Lower Extremity Assessment Details Patient Name: Date of Service: Gregory [name] RNO LD 07/06/2021 12:30 PM Medical Record Number: [id number] Patient Account Number: [id number] Date of Birth/Sex: Treating RN: 1921-01-25 (88 y.o. Janyth Contes Primary Care Tanayah Squitieri: Wylene Simmer Other Clinician: Referring Croix Presley: Treating Lavere Stork/Extender: Roxy Cedar in Treatment: 8 Edema Assessment Assessed: [Left: No] [Right: No] E[Left: dema] [Right: :] Calf Left: Right: Point of Measurement: From Medial Instep 31 cm 29 cm Ankle Left: Right: Point of Measurement: From Medial Instep 20.5 cm 20 cm Vascular Assessment Pulses: Dorsalis Pedis Palpable: [Left:Yes] [Right:Yes] Electronic Signature(s) Signed: 07/06/2021 5:38:25 PM By: Levan Hurst RN, BSN Entered By: Levan Hurst on 07/06/2021 12:58:04 -------------------------------------------------------------------------------- Multi Wound Chart Details Patient Name: Date of Service: [name], [name] 07/06/2021 12:30 PM Medical Record Number: [id number] Patient Account Number: [id number] Date of Birth/Sex: Treating RN: 1920/10/27 (88 y.o. [name] Primary Care Arthur Aydelotte: Wylene Simmer Other Clinician: Referring Loella Hickle: Treating Scarlett Portlock/Extender: Roxy Cedar in Treatment: 8 Vital Signs Height(in): Pulse(bpm): 83 Weight(lbs): Blood Pressure(mmHg): 127/56 Body Mass Index(BMI): Temperature(F): 97.7 Respiratory Rate(breaths/min): 16 Photos: [1:Right T Great oe] [2:Left T Great oe] [4:Right, Lateral Foot] Wound Location: [1:Pressure Injury] [2:Pressure Injury] [4:Gradually Appeared] Wounding Event: [1:Arterial Insufficiency Ulcer] [2:Arterial Insufficiency Ulcer] [4:Pressure Ulcer] Primary Etiology: [1:Arrhythmia, Hypertension] [2:Arrhythmia, Hypertension] [4:Arrhythmia, Hypertension] Comorbid History: [1:02/12/2021] [2:02/12/2021] [4:04/12/2021] Date Acquired: [1:8] [2:8] [4:4] Weeks of Treatment: [1:Open] [2:Open] [4:Open] Wound Status: [1:No] [2:No] [4:No] Wound Recurrence: [1:1.7x1.5x0.1] [2:1.6x2x0.1] [4:0.5x0.4x0.1] Measurements L x W x D (cm) [1:2.003] [2:2.513] [4:0.157] A (cm) : rea [1:0.2] [2:0.251] [  4:0.016] Volume (cm) : [1:57.50%] [2:36.00%] [4:-234.00%] % Reduction in A rea: [1:57.50%] [2:36.10%] [4:-220.00%] % Reduction in Volume: [1:Full Thickness Without Exposed] [2:Unclassifiable] [4:Category/Stage II] Classification: [1:Support Structures Medium] [2:Small] [4:Small] Exudate A mount: [1:Serosanguineous] [2:Serosanguineous] [4:Serosanguineous] Exudate Type: [1:red, brown] [2:red, brown] [4:red, brown] Exudate Color: [1:Flat and Intact] [2:Flat and Intact] [4:Flat and Intact] Wound Margin: [1:None Present (0%)] [2:None Present (0%)] [4:None Present (0%)] Granulation A mount: [1:N/A] [2:N/A] [4:N/A] Granulation Quality: [1:Large (67-100%)] [2:Large (67-100%)] [4:Large (67-100%)] Necrotic A mount: [1:Eschar] [2:Eschar] [4:Eschar] Necrotic Tissue: [1:Fat Layer (Subcutaneous Tissue): Yes Fat Layer (Subcutaneous Tissue): Yes Fat Layer (Subcutaneous Tissue): Yes] Exposed Structures: [1:Fascia: No Tendon: No Muscle: No Joint: No Bone: No None] [2:Fascia: No Tendon: No Muscle: No Joint: No Bone: No None] [4:Fascia:  No Tendon: No Muscle: No Joint: No Bone: No Small (1-33%)] Epithelialization: [1:Debridement - Selective/Open Wound Debridement - Selective/Open Wound Debridement - Selective/Open Wound] Debridement: Pre-procedure Verification/Time Out 13:27 [2:13:27] [4:13:27] Taken: [1:Lidocaine 4% Topical Solution] [2:Lidocaine 4% Topical Solution] [4:Lidocaine 4% Topical Solution] Pain Control: [1:Necrotic/Eschar, Slough] [2:Necrotic/Eschar, Callus, Slough] [4:Necrotic/Eschar, Slough] Tissue Debrided: [1:Non-Viable Tissue] [2:Non-Viable Tissue] [4:Non-Viable Tissue] Level: [1:2.55] [2:3.2] [4:0.2] Debridement A (sq cm): [1:rea Curette] [2:Curette] [4:Curette] Instrument: [1:Minimum] [2:Minimum] [4:Minimum] Bleeding: [1:Pressure] [2:Pressure] [4:Pressure] Hemostasis A chieved: [1:0] [2:0] [4:0] Procedural Pain: [1:0] [2:0] [4:0] Post Procedural Pain: [1:Procedure was tolerated well] [2:Procedure was tolerated well] [4:Procedure was tolerated well] Debridement Treatment Response: [1:1.7x1.5x0.1] [2:1.6x2x0.1] [4:0.5x0.4x0.1] Post Debridement Measurements L x W x D (cm) [1:0.2] [2:0.251] [4:0.016] Post Debridement Volume: (cm) [1:N/A] [2:N/A] [4:Category/Stage II] Post Debridement Stage: [1:Debridement] [2:Debridement] [4:Debridement] Wound Number: 5 N/A N/A Photos: N/A N/A Right, Medial T Great oe N/A N/A Wound Location: Blister N/A N/A Wounding Event: Pressure Ulcer N/A N/A Primary Etiology: Arrhythmia, Hypertension N/A N/A Comorbid History: 06/12/2021 N/A N/A Date Acquired: 1 N/A N/A Weeks of Treatment: Open N/A N/A Wound Status: No N/A N/A Wound Recurrence: 2x3x0.1 N/A N/A Measurements L x W x D (cm) 4.712 N/A N/A A (cm) : rea 0.471 N/A N/A Volume (cm) : -6536.60% N/A N/A % Reduction in A rea: -6628.60% N/A N/A % Reduction in Volume: Category/Stage II N/A N/A Classification: Medium N/A N/A Exudate A mount: Serosanguineous N/A N/A Exudate Type: red, brown N/A N/A Exudate  Color: N/A N/A N/A Wound Margin: Small (1-33%) N/A N/A Granulation A mount: Red N/A N/A Granulation Quality: Large (67-100%) N/A N/A Necrotic A mount: Eschar, Adherent Slough N/A N/A Necrotic Tissue: Fat Layer (Subcutaneous Tissue): Yes N/A N/A Exposed Structures: Fascia: No Tendon: No Muscle: No Joint: No Bone: No None N/A N/A Epithelialization: Debridement - Selective/Open Wound N/A N/A Debridement: Pre-procedure Verification/Time Out 13:27 N/A N/A Taken: Lidocaine 4% Topical Solution N/A N/A Pain Control: Necrotic/Eschar, Slough N/A N/A Tissue Debrided: Non-Viable Tissue N/A N/A Level: 6 N/A N/A Debridement A (sq cm): rea Curette N/A N/A Instrument: Minimum N/A N/A Bleeding: Pressure N/A N/A Hemostasis A chieved: 0 N/A N/A Procedural Pain: 0 N/A N/A Post Procedural Pain: Procedure was tolerated well N/A N/A Debridement Treatment Response: 2x3x0.1 N/A N/A Post Debridement Measurements L x W x D (cm) 0.471 N/A N/A Post Debridement Volume: (cm) Category/Stage II N/A N/A Post Debridement Stage: Debridement N/A N/A Procedures Performed: Treatment Notes Electronic Signature(s) Signed: 07/06/2021 1:40:08 PM By: Fredirick Maudlin MD FACS Signed: 07/06/2021 6:59:07 PM By: Dellie Catholic RN Entered By: Fredirick Maudlin on 07/06/2021 13:40:08 -------------------------------------------------------------------------------- Multi-Disciplinary Care Plan Details Patient Name: Date of Service: [name], [name] 07/06/2021 12:30 PM Medical Record Number: [id number] Patient Account Number: [id number]  Date of Birth/Sex: Treating RN: 04/20/1920 (88 y.o. [name] Primary Care Ozro Russett: Wylene Simmer Other Clinician: Referring Naiomi Musto: Treating Angelissa Supan/Extender: Roxy Cedar in Treatment: 8 Multidisciplinary Care Plan reviewed with physician Active Inactive Abuse / Safety / Falls / Self Care Management Nursing  Diagnoses: Potential for falls Potential for injury related to falls Goals: Patient will not experience any injury related to falls Date Initiated: 05/05/2021 Target Resolution Date: 08/11/2021 Goal Status: Active Patient/caregiver will verbalize/demonstrate measures taken to prevent injury and/or falls Date Initiated: 05/05/2021 Target Resolution Date: 08/11/2021 Goal Status: Active Interventions: Assess Activities of Daily Living upon admission and as needed Assess fall risk on admission and as needed Assess: immobility, friction, shearing, incontinence upon admission and as needed Assess impairment of mobility on admission and as needed per policy Assess personal safety and home safety (as indicated) on admission and as needed Assess self care needs on admission and as needed Provide education on fall prevention Provide education on personal and home safety Notes: Nutrition Nursing Diagnoses: Potential for alteratiion in Nutrition/Potential for imbalanced nutrition Goals: Patient/caregiver agrees to and verbalizes understanding of need to use nutritional supplements and/or vitamins as prescribed Date Initiated: 05/05/2021 Target Resolution Date: 08/11/2021 Goal Status: Active Interventions: Assess HgA1c results as ordered upon admission and as needed Assess patient nutrition upon admission and as needed per policy Provide education on elevated blood sugars and impact on wound healing Provide education on nutrition Treatment Activities: Education provided on Nutrition : 05/05/2021 Notes: Wound/Skin Impairment Nursing Diagnoses: Impaired tissue integrity Knowledge deficit related to ulceration/compromised skin integrity Goals: Patient/caregiver will verbalize understanding of skin care regimen Date Initiated: 05/05/2021 Target Resolution Date: 08/11/2021 Goal Status: Active Interventions: Assess patient/caregiver ability to obtain necessary supplies Assess patient/caregiver  ability to perform ulcer/skin care regimen upon admission and as needed Assess ulceration(s) every visit Provide education on ulcer and skin care Notes: Electronic Signature(s) Signed: 07/06/2021 6:59:07 PM By: Dellie Catholic RN Entered By: Dellie Catholic on 07/06/2021 17:51:29 -------------------------------------------------------------------------------- Pain Assessment Details Patient Name: Date of Service: Gregory [name] RNO LD 07/06/2021 12:30 PM Medical Record Number: [id number] Patient Account Number: [id number] Date of Birth/Sex: Treating RN: [birth year] (88 y.o. Janyth Contes Primary Care Hailyn Zarr: Wylene Simmer Other Clinician: Referring Chiquita Heckert: Treating Jamauri Kruzel/Extender: Roxy Cedar in Treatment: 8 Active Problems Location of Pain Severity and Description of Pain Patient Has Paino No Site Locations Pain Management and Medication Current Pain Management: Electronic Signature(s) Signed: 07/06/2021 5:38:25 PM By: Levan Hurst RN, BSN Entered By: Levan Hurst on 07/06/2021 12:57:55 -------------------------------------------------------------------------------- Patient/Caregiver Education Details Patient Name: Date of Service: Gregory [name] RNO LD 5/25/2023andnbsp12:30 PM Medical Record Number: [id number] Patient Account Number: [id number] Date of Birth/Gender: Treating RN: [birth year] (88 y.o. [name] Primary Care Physician: Wylene Simmer Other Clinician: Referring Physician: Treating Physician/Extender: Roxy Cedar in Treatment: 8 Education Assessment Education Provided To: Patient Education Topics Provided Wound/Skin Impairment: Methods: Explain/Verbal Responses: Return demonstration correctly Electronic Signature(s) Signed: 07/06/2021 6:59:07 PM By: Dellie Catholic RN Entered By: Dellie Catholic on 07/06/2021  17:51:48 -------------------------------------------------------------------------------- Wound Assessment Details Patient Name: Date of Service: Gregory [name] RNO LD 07/06/2021 12:30 PM Medical Record Number: [id number] Patient Account Number: [id number] Date of Birth/Sex: Treating RN: 01/10/1921 (88 y.o. [name] Primary Care Lekeshia Kram: Wylene Simmer Other Clinician: Referring Elyn Krogh: Treating Joaquina Nissen/Extender: Roxy Cedar in Treatment: 8 Wound Status Wound Number: 1 Primary Etiology: Arterial Insufficiency Ulcer Wound Location: Right T Great oe Wound Status: Open  Wounding Event: Pressure Injury Comorbid History: Arrhythmia, Hypertension Date Acquired: 02/12/2021 Weeks Of Treatment: 8 Clustered Wound: No Photos Wound Measurements Length: (cm) 1.7 Width: (cm) 1.5 Depth: (cm) 0.1 Area: (cm) 2.003 Volume: (cm) 0.2 % Reduction in Area: 57.5% % Reduction in Volume: 57.5% Epithelialization: None Tunneling: No Undermining: No Wound Description Classification: Full Thickness Without Exposed Support Structures Wound Margin: Flat and Intact Exudate Amount: Medium Exudate Type: Serosanguineous Exudate Color: red, brown Foul Odor After Cleansing: No Slough/Fibrino Yes Wound Bed Granulation Amount: None Present (0%) Exposed Structure Necrotic Amount: Large (67-100%) Fascia Exposed: No Necrotic Quality: Eschar Fat Layer (Subcutaneous Tissue) Exposed: Yes Tendon Exposed: No Muscle Exposed: No Joint Exposed: No Bone Exposed: No Treatment Notes Wound #1 (Toe Great) Wound Laterality: Right Cleanser Soap and Water Discharge Instruction: May shower and wash wound with dial antibacterial soap and water prior to dressing change. Wound Cleanser Discharge Instruction: Cleanse the wound with wound cleanser or normal saline prior to applying a clean dressing using gauze sponges, not tissue or cotton balls. Peri-Wound  Care Topical Primary Dressing Iodosorb Gel 10 (gm) Tube Discharge Instruction: Apply to wound bed as instructed Secondary Dressing Woven Gauze Sponges 2x2 in Discharge Instruction: Apply over primary dressing as directed. Secured With Conforming Stretch Gauze Bandage, Sterile 2x75 (in/in) Discharge Instruction: Secure with stretch gauze as directed. Compression Wrap Compression Stockings Add-Ons Electronic Signature(s) Signed: 07/06/2021 6:59:07 PM By: Dellie Catholic RN Entered By: Dellie Catholic on 07/06/2021 13:14:15 -------------------------------------------------------------------------------- Wound Assessment Details Patient Name: Date of Service: Gregory [name] RNO LD 07/06/2021 12:30 PM Medical Record Number: [id number] Patient Account Number: [id number] Date of Birth/Sex: Treating RN: [birth year] (88 y.o. [name] Primary Care Deniro Laymon: Wylene Simmer Other Clinician: Referring Uziah Sorter: Treating Xavion Muscat/Extender: Roxy Cedar in Treatment: 8 Wound Status Wound Number: 2 Primary Etiology: Arterial Insufficiency Ulcer Wound Location: Left T Great oe Wound Status: Open Wounding Event: Pressure Injury Comorbid History: Arrhythmia, Hypertension Date Acquired: 02/12/2021 Weeks Of Treatment: 8 Clustered Wound: No Photos Wound Measurements Length: (cm) 1.6 Width: (cm) 2 Depth: (cm) 0.1 Area: (cm) 2.513 Volume: (cm) 0.251 % Reduction in Area: 36% % Reduction in Volume: 36.1% Epithelialization: None Tunneling: No Undermining: No Wound Description Classification: Unclassifiable Wound Margin: Flat and Intact Exudate Amount: Small Exudate Type: Serosanguineous Exudate Color: red, brown Foul Odor After Cleansing: No Slough/Fibrino No Wound Bed Granulation Amount: None Present (0%) Exposed Structure Necrotic Amount: Large (67-100%) Fascia Exposed: No Necrotic Quality: Eschar Fat Layer (Subcutaneous Tissue) Exposed:  Yes Tendon Exposed: No Muscle Exposed: No Joint Exposed: No Bone Exposed: No Treatment Notes Wound #2 (Toe Great) Wound Laterality: Left Cleanser Soap and Water Discharge Instruction: May shower and wash wound with dial antibacterial soap and water prior to dressing change. Wound Cleanser Discharge Instruction: Cleanse the wound with wound cleanser or normal saline prior to applying a clean dressing using gauze sponges, not tissue or cotton balls. Peri-Wound Care Topical Primary Dressing Iodosorb Gel 10 (gm) Tube Discharge Instruction: Apply to wound bed as instructed Secondary Dressing Woven Gauze Sponges 2x2 in Discharge Instruction: Apply over primary dressing as directed. Secured With Conforming Stretch Gauze Bandage, Sterile 2x75 (in/in) Discharge Instruction: Secure with stretch gauze as directed. Compression Wrap Compression Stockings Add-Ons Electronic Signature(s) Signed: 07/06/2021 6:59:07 PM By: Dellie Catholic RN Entered By: Dellie Catholic on 07/06/2021 13:14:50 -------------------------------------------------------------------------------- Wound Assessment Details Patient Name: Date of Service: Gregory [name] RNO LD 07/06/2021 12:30 PM Medical Record Number: [id number] Patient Account Number: [id number] Date of Birth/Sex: Treating RN: [birth year] (  88 y.o. [name] Primary Care Kasra Melvin: Wylene Simmer Other Clinician: Referring Reannon Candella: Treating Michelene Keniston/Extender: Roxy Cedar in Treatment: 8 Wound Status Wound Number: 4 Primary Etiology: Pressure Ulcer Wound Location: Right, Lateral Foot Wound Status: Open Wounding Event: Gradually Appeared Comorbid History: Arrhythmia, Hypertension Date Acquired: 04/12/2021 Weeks Of Treatment: 4 Clustered Wound: No Photos Wound Measurements Length: (cm) 0.5 Width: (cm) 0.4 Depth: (cm) 0.1 Area: (cm) 0.157 Volume: (cm) 0.016 % Reduction in Area: -234% % Reduction in  Volume: -220% Epithelialization: Small (1-33%) Tunneling: No Undermining: No Wound Description Classification: Category/Stage II Wound Margin: Flat and Intact Exudate Amount: Small Exudate Type: Serosanguineous Exudate Color: red, brown Foul Odor After Cleansing: No Slough/Fibrino Yes Wound Bed Granulation Amount: None Present (0%) Exposed Structure Necrotic Amount: Large (67-100%) Fascia Exposed: No Necrotic Quality: Eschar Fat Layer (Subcutaneous Tissue) Exposed: Yes Tendon Exposed: No Muscle Exposed: No Joint Exposed: No Bone Exposed: No Treatment Notes Wound #4 (Foot) Wound Laterality: Right, Lateral Cleanser Soap and Water Discharge Instruction: May shower and wash wound with dial antibacterial soap and water prior to dressing change. Peri-Wound Care Topical Primary Dressing Iodosorb Gel 10 (gm) Tube Discharge Instruction: Apply to wound bed as instructed Secondary Dressing ALLEVYN Gentle Border, 3x3 (in/in) Discharge Instruction: Apply over primary dressing as directed. Secured With Compression Wrap Compression Stockings Environmental education officer) Signed: 07/06/2021 6:59:07 PM By: Dellie Catholic RN Entered By: Dellie Catholic on 07/06/2021 13:13:38 -------------------------------------------------------------------------------- Wound Assessment Details Patient Name: Date of Service: Gregory [name] RNO LD 07/06/2021 12:30 PM Medical Record Number: [id number] Patient Account Number: [id number] Date of Birth/Sex: Treating RN: [birth year] (88 y.o. [name] Primary Care Chabely Norby: Wylene Simmer Other Clinician: Referring Hymie Gorr: Treating Tymira Horkey/Extender: Roxy Cedar in Treatment: 8 Wound Status Wound Number: 5 Primary Etiology: Pressure Ulcer Wound Location: Right, Medial T Great oe Wound Status: Open Wounding Event: Blister Comorbid History: Arrhythmia, Hypertension Date Acquired: 06/12/2021 Weeks Of  Treatment: 1 Clustered Wound: No Photos Wound Measurements Length: (cm) 2 Width: (cm) 3 Depth: (cm) 0.1 Area: (cm) 4.712 Volume: (cm) 0.471 % Reduction in Area: -6536.6% % Reduction in Volume: -6628.6% Epithelialization: None Tunneling: No Undermining: No Wound Description Classification: Category/Stage II Exudate Amount: Medium Exudate Type: Serosanguineous Exudate Color: red, brown Foul Odor After Cleansing: No Slough/Fibrino Yes Wound Bed Granulation Amount: Small (1-33%) Exposed Structure Granulation Quality: Red Fascia Exposed: No Necrotic Amount: Large (67-100%) Fat Layer (Subcutaneous Tissue) Exposed: Yes Necrotic Quality: Eschar, Adherent Slough Tendon Exposed: No Muscle Exposed: No Joint Exposed: No Bone Exposed: No Treatment Notes Wound #5 (Toe Great) Wound Laterality: Right, Medial Cleanser Soap and Water Discharge Instruction: May shower and wash wound with dial antibacterial soap and water prior to dressing change. Peri-Wound Care Topical Primary Dressing KerraCel Ag Gelling Fiber Dressing, 2x2 in (silver alginate) Discharge Instruction: Apply silver alginate to wound bed as instructed Secondary Dressing ALLEVYN Gentle Border, 3x3 (in/in) Discharge Instruction: Apply over primary dressing as directed. Secured With Compression Wrap Compression Stockings Environmental education officer) Signed: 07/06/2021 6:59:07 PM By: Dellie Catholic RN Entered By: Dellie Catholic on 07/06/2021 13:12:47 -------------------------------------------------------------------------------- Wound Assessment Details Patient Name: Date of Service: Gregory [name] RNO LD 07/06/2021 12:30 PM Medical Record Number: [id number] Patient Account Number: [id number] Date of Birth/Sex: Treating RN: [birth year] (88 y.o. [name] Primary Care Camilia Caywood: Wylene Simmer Other Clinician: Referring Cecil Bixby: Treating Danese Dorsainvil/Extender: Roxy Cedar  in Treatment: 8 Wound Status Wound Number: 6 Primary Etiology: Pressure Ulcer Wound Location: Right Calcaneus Wound Status:  Open Wounding Event: Pressure Injury Comorbid History: Arrhythmia, Hypertension Date Acquired: 06/24/2021 Weeks Of Treatment: 0 Clustered Wound: No Photos Wound Measurements Length: (cm) 0.2 % Redu Width: (cm) 0.2 % Redu Depth: (cm) 0.2 Epithe Area: (cm) 0.031 Tunne Volume: (cm) 0.006 Under ction in Area: ction in Volume: lialization: None ling: No mining: No Wound Description Classification: Category/Stage II Foul Exudate Amount: Medium Sloug Exudate Type: Serosanguineous Exudate Color: red, brown Odor After Cleansing: No h/Fibrino Yes Wound Bed Granulation Amount: Medium (34-66%) Exposed Structure Granulation Quality: Pink Fascia Exposed: No Necrotic Amount: Medium (34-66%) Fat Layer (Subcutaneous Tissue) Exposed: Yes Necrotic Quality: Eschar, Adherent Slough Tendon Exposed: No Muscle Exposed: No Joint Exposed: No Bone Exposed: No Treatment Notes Wound #6 (Calcaneus) Wound Laterality: Right Cleanser Peri-Wound Care Topical Primary Dressing Secondary Dressing Secured With Compression Wrap Compression Stockings Add-Ons Electronic Signature(s) Signed: 07/06/2021 6:59:07 PM By: Dellie Catholic RN Entered By: Dellie Catholic on 07/06/2021 13:55:41 -------------------------------------------------------------------------------- Vitals Details Patient Name: Date of Service: [name], [name] 07/06/2021 12:30 PM Medical Record Number: [id number] Patient Account Number: [id number] Date of Birth/Sex: Treating RN: 1920/11/01 (88 y.o. Janyth Contes [facility] Geniva Lohnes: Wylene Simmer Other Clinician: Referring Noal Abshier: Treating Angelize Ryce/Extender: Roxy Cedar in Treatment: 8 Vital Signs Time Taken: 12:57 Temperature (F): 97.7 Pulse (bpm): 79 Respiratory Rate (breaths/min): 16 Blood Pressure  (mmHg): 127/56 Reference Range: 80 - 120 mg / dl Electronic Signature(s) Signed: 07/06/2021 5:38:25 PM By: Levan Hurst RN, BSN Entered By: Levan Hurst on 07/06/2021 12:57:30

## 2021-07-07 NOTE — Progress Notes (Signed)
SINCERE, LIUZZI ([id number]) Visit Report for 07/06/2021 Chief Complaint Document Details Patient Name: Date of Service: Gregory [name] [facility] LD 07/06/2021 12:30 PM Medical Record Number: [id number] Patient Account Number: [id number] Date of Birth/Sex: Treating RN: 1921/02/05 (88 y.o. [name] Primary Care Provider: Wylene Simmer Other Clinician: Referring Provider: Treating Provider/Extender: Roxy Cedar in Treatment: 8 Information Obtained from: Patient Chief Complaint Patient is at the clinic for treatment of an open pressure ulcer on his left heel and dry gangrene of both great toes Electronic Signature(s) Signed: 07/06/2021 1:40:21 PM By: Fredirick Maudlin MD FACS Entered By: Fredirick Maudlin on 07/06/2021 13:40:21 -------------------------------------------------------------------------------- Debridement Details Patient Name: Date of Service: Gregory [name], [name] 07/06/2021 12:30 PM Medical Record Number: [id number] Patient Account Number: [id number] Date of Birth/Sex: Treating RN: 1920-05-31 (88 y.o. [name] Primary Care Provider: Wylene Simmer Other Clinician: Referring Provider: Treating Provider/Extender: Roxy Cedar in Treatment: 8 Debridement Performed for Assessment: Wound #5 Right,Medial T Great oe Performed By: Physician Fredirick Maudlin, MD Debridement Type: Debridement Level of Consciousness (Pre-procedure): Awake and Alert Pre-procedure Verification/Time Out Yes - 13:27 Taken: Start Time: 13:27 Pain Control: Lidocaine 4% Topical Solution T Area Debrided (L x W): otal 2 (cm) x 3 (cm) = 6 (cm) Tissue and other material debrided: Non-Viable, Eschar, [place] Level: Non-Viable Tissue Debridement Description: Selective/Open Wound Instrument: Curette Bleeding: Minimum Hemostasis Achieved: Pressure End Time: 13:29 Procedural Pain: 0 Post Procedural Pain: 0 Response to Treatment:  Procedure was tolerated well Level of Consciousness (Post- Awake and Alert procedure): Post Debridement Measurements of Total Wound Length: (cm) 2 Stage: Category/Stage II Width: (cm) 3 Depth: (cm) 0.1 Volume: (cm) 0.471 Character of Wound/Ulcer Post Debridement: Improved Post Procedure Diagnosis Same as Pre-procedure Electronic Signature(s) Signed: 07/06/2021 2:38:58 PM By: Fredirick Maudlin MD FACS Signed: 07/06/2021 6:59:07 PM By: Dellie Catholic RN Entered By: Dellie Catholic on 07/06/2021 13:31:00 -------------------------------------------------------------------------------- Debridement Details Patient Name: Date of Service: Gregory [name], [name] 07/06/2021 12:30 PM Medical Record Number: [id number] Patient Account Number: [id number] Date of Birth/Sex: Treating RN: [birth year] (88 y.o. [name] Primary Care Provider: Wylene Simmer Other Clinician: Referring Provider: Treating Provider/Extender: Roxy Cedar in Treatment: 8 Debridement Performed for Assessment: Wound #4 Right,Lateral Foot Performed By: Physician Fredirick Maudlin, MD Debridement Type: Debridement Level of Consciousness (Pre-procedure): Awake and Alert Pre-procedure Verification/Time Out Yes - 13:27 Taken: Start Time: 13:27 Pain Control: Lidocaine 4% T opical Solution T Area Debrided (L x W): otal 0.5 (cm) x 0.4 (cm) = 0.2 (cm) Tissue and other material debrided: Non-Viable, Eschar, Slough, [place] Level: Non-Viable Tissue Debridement Description: Selective/Open Wound Instrument: Curette Bleeding: Minimum Hemostasis Achieved: Pressure End Time: 13:29 Procedural Pain: 0 Post Procedural Pain: 0 Response to Treatment: Procedure was tolerated well Level of Consciousness (Post- Awake and Alert procedure): Post Debridement Measurements of Total Wound Length: (cm) 0.5 Stage: Category/Stage II Width: (cm) 0.4 Depth: (cm) 0.1 Volume: (cm) 0.016 Character of  Wound/Ulcer Post Debridement: Improved Post Procedure Diagnosis Same as Pre-procedure Electronic Signature(s) Signed: 07/06/2021 2:38:58 PM By: Fredirick Maudlin MD FACS Signed: 07/06/2021 6:59:07 PM By: Dellie Catholic RN Entered By: Dellie Catholic on 07/06/2021 13:32:30 -------------------------------------------------------------------------------- Debridement Details Patient Name: Date of Service: Gregory [name], [name] 07/06/2021 12:30 PM Medical Record Number: [id number] Patient Account Number: [id number] Date of Birth/Sex: Treating RN: 11/03/1920 (88 y.o. [name] Primary Care Provider: Wylene Simmer Other Clinician: Referring Provider: Treating Provider/Extender: Roxy Cedar in Treatment: 8  Debridement Performed for Assessment: Wound #2 Left T Great oe Performed By: Physician Fredirick Maudlin, MD Debridement Type: Debridement Severity of Tissue Pre Debridement: Fat layer exposed Level of Consciousness (Pre-procedure): Awake and Alert Pre-procedure Verification/Time Out Yes - 13:27 Taken: Start Time: 13:27 Pain Control: Lidocaine 4% T opical Solution T Area Debrided (L x W): otal 1.6 (cm) x 2 (cm) = 3.2 (cm) Tissue and other material debrided: Non-Viable, Callus, Eschar, Slough, Slough Level: Non-Viable Tissue Debridement Description: Selective/Open Wound Instrument: Curette Bleeding: Minimum Hemostasis Achieved: Pressure End Time: 13:29 Procedural Pain: 0 Post Procedural Pain: 0 Response to Treatment: Procedure was tolerated well Level of Consciousness (Post- Awake and Alert procedure): Post Debridement Measurements of Total Wound Length: (cm) 1.6 Width: (cm) 2 Depth: (cm) 0.1 Volume: (cm) 0.251 Character of Wound/Ulcer Post Debridement: Improved Severity of Tissue Post Debridement: Fat layer exposed Post Procedure Diagnosis Same as Pre-procedure Electronic Signature(s) Signed: 07/06/2021 2:38:58 PM By: Fredirick Maudlin MD FACS Signed: 07/06/2021 6:59:07 PM By: Dellie Catholic RN Entered By: Dellie Catholic on 07/06/2021 13:37:33 -------------------------------------------------------------------------------- Debridement Details Patient Name: Date of Service: Gregory [name], [name] 07/06/2021 12:30 PM Medical Record Number: [id number] Patient Account Number: [id number] Date of Birth/Sex: Treating RN: 10-03-1920 (88 y.o. [name] Primary Care Provider: Wylene Simmer Other Clinician: Referring Provider: Treating Provider/Extender: Roxy Cedar in Treatment: 8 Debridement Performed for Assessment: Wound #1 Right T Great oe Performed By: Physician Fredirick Maudlin, MD Debridement Type: Debridement Severity of Tissue Pre Debridement: Fat layer exposed Level of Consciousness (Pre-procedure): Awake and Alert Pre-procedure Verification/Time Out Yes - 13:27 Taken: Start Time: 13:27 Pain Control: Lidocaine 4% T opical Solution T Area Debrided (L x W): otal 1.7 (cm) x 1.5 (cm) = 2.55 (cm) Tissue and other material debrided: Non-Viable, Eschar, Slough, Slough Level: Non-Viable Tissue Debridement Description: Selective/Open Wound Instrument: Curette Bleeding: Minimum Hemostasis Achieved: Pressure End Time: 13:29 Procedural Pain: 0 Post Procedural Pain: 0 Response to Treatment: Procedure was tolerated well Level of Consciousness (Post- Awake and Alert procedure): Post Debridement Measurements of Total Wound Length: (cm) 1.7 Width: (cm) 1.5 Depth: (cm) 0.1 Volume: (cm) 0.2 Character of Wound/Ulcer Post Debridement: Improved Severity of Tissue Post Debridement: Fat layer exposed Post Procedure Diagnosis Same as Pre-procedure Electronic Signature(s) Signed: 07/06/2021 2:38:58 PM By: Fredirick Maudlin MD FACS Signed: 07/06/2021 6:59:07 PM By: Dellie Catholic RN Entered By: Dellie Catholic on 07/06/2021  13:39:16 -------------------------------------------------------------------------------- HPI Details Patient Name: Date of Service: Gregory [name], [name] 07/06/2021 12:30 PM Medical Record Number: [id number] Patient Account Number: [id number] Date of Birth/Sex: Treating RN: Oct 28, 1920 (88 y.o. [name] Primary Care Provider: Wylene Simmer Other Clinician: Referring Provider: Treating Provider/Extender: Roxy Cedar in Treatment: 8 History of Present Illness HPI Description: ADMISSION 05/05/2021 This is an extremely frail 88 year old man who presents to clinic today accompanied by his son for evaluation of a pressure ulcer on his left heel. He also has dry gangrene on the tips of his bilateral great toes and the lateral aspect of both feet. It sounds like after a hospitalization for a GI bleed, he had a blanket on his feet which ultimately resulted in a pressure injury. He has severe dysphagia and is dependent on G-tube feeding. The wound on his heel has been present since January. He has 24-hour care and they have been applying Xeroform to the heel. He apparently has some sort of air boot that he is meant to wear in bed at night. He has severe peripheral  arterial disease as evidenced by the ABIs obtained by Dr. Rachelle Hora and copied here: ABI Findings: +---------+------------------+-----+----------+--------+ Right Rt Pressure (mmHg)IndexWaveform Comment  +---------+------------------+-----+----------+--------+ Brachial 136     +---------+------------------+-----+----------+--------+ PTA >255 [place] monophasic  +---------+------------------+-----+----------+--------+ DP >255 [place] monophasic  +---------+------------------+-----+----------+--------+ Great T oe24 0.18    +---------+------------------+-----+----------+--------+ +---------+------------------+-----+----------+-------+ Left Lt Pressure (mmHg)IndexWaveform  Comment +---------+------------------+-----+----------+-------+ Brachial 136     +---------+------------------+-----+----------+-------+ PTA >255 [place] monophasic  +---------+------------------+-----+----------+-------+ DP >255 [place] monophasic  +---------+------------------+-----+----------+-------+ Great T oe0 0.00 Absent   +---------+------------------+-----+----------+-------+ +-------+-----------+-----------+------------+------------+ ABI/TBIT oday's ABIT oday's TBIPrevious ABIPrevious TBI +-------+-----------+-----------+------------+------------+ Right  0.18    +-------+-----------+-----------+------------+------------+ Left  0.00    +-------+-----------+-----------+------------+------------+ Summary: Right: Resting right ankle-brachial index indicates noncompressible right lower extremity arteries. The right toe-brachial index is abnormal. Left: Resting left ankle-brachial index indicates noncompressible left lower extremity arteries. Absent left great toe PPG waveform. He saw Dr. Rachelle Hora on March 16 and at that time, he was felt to be far too frail to undergo any sort of intervention including an aortogram. Certainly not a surgical candidate. According to the electronic medical record, Dr. Rachelle Hora is planning to see him back in a couple of weeks and Gregory determine if he has made any significant improvement in his overall status and might be suitable for an intervention. I read his note to suggest that the patient has had multiple complications from other procedures and that even an aortogram may be too risky. 05/12/2021: Rather remarkably, the eschar and dry gangrene on his bilateral great toes and bilateral fifth metatarsals actually looks better. Some of the eschar on his toes is peeling away to reveal healing tissue underneath. This is also the case with his lateral foot bilaterally. The heel is clean and superficial, with minimal  slough. 06/07/2021: Since our last visit, Mr. Hemann has been in the hospital quite a bit. While in the hospital, they were painting his lateral feet and great toes with Betadine. The wound on his heel is nearly closed with just a sliver of open skin. They have been floating his heels as directed and his home health nurse recently ordered a device to keep his blankets from rubbing on his toes. They have continued using the Betadine on his lateral feet and great toes and the Prisma silver collagen on his left heel. He does have a bit of callus buildup on his right Achilles tendon area suggesting there is some pressure being applied. 06/15/2021: The left heel wound is closed. There is some slough buildup in the right lateral foot wound. Both great toe wounds have a layer of thick dark eschar, but they appear to have some viable tissue underneath this. He is complaining of pain in his right heel; he had a wound there at one time. On inspection, there is no open skin at this site. 06/23/2021: The left heel remains closed. The right heel never has opened. Both great toe wounds continue to have a layer of thick dark eschar, but it is softer today. The right lateral foot wound also has eschar overlying the surface which has some accumulated slough. He also has areas on his great toes bilaterally that look like they have been rubbing on the slippers that he wears all day. 07/06/2021: The right heel has a new callus on it. It peeled off as I was examining the site and there is a small open wound in that area. The right great toe looks worse today; there are more darkened areas of eschar and the toe itself feels a bit boggy. The right  lateral foot is small with just a small amount of slough. The left great toe is in reasonable condition with just a layer of fairly soft eschar at the tip. According to the patient's family, he has been insisting upon blankets being wrapped over his feet at night and he does not  cooperate with heel flotation. He also required a 2 unit blood transfusion last week for fairly significant anemia. Electronic Signature(s) Signed: 07/06/2021 1:42:16 PM By: Fredirick Maudlin MD FACS Entered By: Fredirick Maudlin on 07/06/2021 13:42:16 -------------------------------------------------------------------------------- Physical Exam Details Patient Name: Date of Service: Gregory [name], [name] 07/06/2021 12:30 PM Medical Record Number: [id number] Patient Account Number: [id number] Date of Birth/Sex: Treating RN: [birth year] (88 y.o. [name] Primary Care Provider: Wylene Simmer Other Clinician: Referring Provider: Treating Provider/Extender: Roxy Cedar in Treatment: 8 Constitutional . . . . He is cachectic. Chronically ill-appearing but in no distress.Marland Kitchen Respiratory Normal work of breathing on room air. Notes 07/06/2021: The right heel has a new callus on it. It peeled off as I was examining the site and there is a small open wound in that area. The right great toe looks worse today; there are more darkened areas of eschar and the toe itself feels a bit boggy. The right lateral foot is small with just a small amount of slough. The left great toe is in reasonable condition with just a layer of fairly soft eschar at the tip Electronic Signature(s) Signed: 07/06/2021 1:44:02 PM By: Fredirick Maudlin MD FACS Entered By: Fredirick Maudlin on 07/06/2021 13:44:02 -------------------------------------------------------------------------------- Physician Orders Details Patient Name: Date of Service: Gregory [name], [name] 07/06/2021 12:30 PM Medical Record Number: [id number] Patient Account Number: [id number] Date of Birth/Sex: Treating RN: 10-Oct-1920 (88 y.o. [name] Primary Care Provider: Wylene Simmer Other Clinician: Referring Provider: Treating Provider/Extender: Roxy Cedar in Treatment: 8 Verbal /  Phone Orders: No Diagnosis Coding ICD-10 Coding Code Description [phone] Atherosclerosis of native arteries of extremities with gangrene, bilateral legs I73.9 Peripheral vascular disease, unspecified E43 Unspecified severe protein-calorie malnutrition N18.31 Chronic kidney disease, stage 3a R13.10 Dysphagia, unspecified L97.512 Non-pressure chronic ulcer of other part of right foot with fat layer exposed L97.522 Non-pressure chronic ulcer of other part of left foot with fat layer exposed L89.612 Pressure ulcer of right heel, stage 2 Follow-up Appointments ppointment in 1 week. - Dr. Celine Ahr Room 3 Return A Bathing/ Shower/ Hygiene May shower and wash wound with soap and water. Edema Control - Lymphedema / SCD / Other Elevate legs to the level of the heart or above for 30 minutes daily and/or when sitting, a frequency of: - throughout the day Avoid standing for long periods of time. Non Wound Condition Bilateral Lower Extremities Protect area with: - Right and Left heels protect with [place] wound care orders this week; continue Home Health for wound care. May utilize formulary equivalent dressing for wound treatment orders unless otherwise specified. Other Home Health Orders/Instructions: - [facility] Wound Treatment Wound #1 - T Great oe Wound Laterality: Right Cleanser: Soap and Water 1 x Per Day/30 Days Discharge Instructions: May shower and wash wound with dial antibacterial soap and water prior to dressing change. Cleanser: Wound Cleanser 1 x Per Day/30 Days Discharge Instructions: Cleanse the wound with wound cleanser or normal saline prior to applying a clean dressing using gauze sponges, not tissue or cotton balls. Prim Dressing: BETADINE (Home Health) (Generic) 1 x Per Day/30 Days ary Discharge Instructions: paint  right great toe Secondary Dressing: Woven Gauze Sponges 2x2 in (Home Health) 1 x Per Day/30 Days Discharge Instructions: Apply over primary  dressing as directed. Secured With: Child psychotherapist, Sterile 2x75 (in/in) (Home Health) 1 x Per Day/30 Days Discharge Instructions: Secure with stretch gauze as directed. Wound #2 - T Great oe Wound Laterality: Left Cleanser: Soap and Water 1 x Per Day/30 Days Discharge Instructions: May shower and wash wound with dial antibacterial soap and water prior to dressing change. Cleanser: Wound Cleanser 1 x Per Day/30 Days Discharge Instructions: Cleanse the wound with wound cleanser or normal saline prior to applying a clean dressing using gauze sponges, not tissue or cotton balls. Prim Dressing: Iodosorb Gel 10 (gm) Tube (Home Health) (Generic) 1 x Per Day/30 Days ary Discharge Instructions: Apply to wound bed as instructed Secondary Dressing: Woven Gauze Sponges 2x2 in ([place]) 1 x Per Day/30 Days Discharge Instructions: Apply over primary dressing as directed. Secured With: Child psychotherapist, Sterile 2x75 (in/in) (Home Health) 1 x Per Day/30 Days Discharge Instructions: Secure with stretch gauze as directed. Wound #4 - Foot Wound Laterality: Right, Lateral Cleanser: Soap and Water (Home Health) 1 x Per Day/30 Days Discharge Instructions: May shower and wash wound with dial antibacterial soap and water prior to dressing change. Prim Dressing: KerraCel Ag Gelling Fiber Dressing, 2x2 in (silver alginate) 1 x Per Day/30 Days ary Discharge Instructions: Apply silver alginate to wound bed as instructed Prim Dressing: 1 x Per Day/30 Days ary Secondary Dressing: ALLEVYN Gentle Border, 3x3 (in/in) 1 x Per Day/30 Days Discharge Instructions: Apply over primary dressing as directed. Wound #5 - T Great oe Wound Laterality: Right, Medial Cleanser: Soap and Water (Home Health) 1 x Per Day/30 Days Discharge Instructions: May shower and wash wound with dial antibacterial soap and water prior to dressing change. Prim Dressing: BETADINE 1 x Per Day/30  Days ary Discharge Instructions: Paint right great toe Secondary Dressing: ALLEVYN Gentle Border, 3x3 (in/in) 1 x Per Day/30 Days Discharge Instructions: Apply over primary dressing as directed. Wound #6 - Calcaneus Wound Laterality: Right Cleanser: Wound Cleanser 1 x Per Day/30 Days Discharge Instructions: Cleanse the wound with wound cleanser prior to applying a clean dressing using gauze sponges, not tissue or cotton balls. Prim Dressing: KerraCel Ag Gelling Fiber Dressing, 2x2 in (silver alginate) 1 x Per Day/30 Days ary Discharge Instructions: Apply silver alginate to wound bed as instructed Secured With: Kerlix Roll Sterile, 4.5x3.1 (in/yd) 1 x Per Day/30 Days Discharge Instructions: Secure with Kerlix as directed. Secured With: Paper Tape, 2x10 (in/yd) 1 x Per Day/30 Days Discharge Instructions: Secure dressing with tape as directed. Electronic Signature(s) Signed: 07/06/2021 6:59:07 PM By: Dellie Catholic RN Signed: 07/07/2021 9:16:19 AM By: Fredirick Maudlin MD FACS Previous Signature: 07/06/2021 2:38:58 PM Version By: Fredirick Maudlin MD FACS Entered By: Dellie Catholic on 07/06/2021 18:01:29 -------------------------------------------------------------------------------- Problem List Details Patient Name: Date of Service: Gregory [name], [name] 07/06/2021 12:30 PM Medical Record Number: [id number] Patient Account Number: [id number] Date of Birth/Sex: Treating RN: 07/07/1920 (88 y.o. [name] Primary Care Provider: Wylene Simmer Other Clinician: Referring Provider: Treating Provider/Extender: Roxy Cedar in Treatment: 8 Active Problems ICD-10 Encounter Code Description Active Date MDM Diagnosis I70.263 Atherosclerosis of native arteries of extremities with gangrene, bilateral legs 05/05/2021 No Yes I73.9 Peripheral vascular disease, unspecified 05/05/2021 No Yes E43 Unspecified severe protein-calorie malnutrition 05/05/2021 No  Yes N18.31 Chronic kidney disease, stage 3a 05/05/2021 No Yes R13.10 Dysphagia, unspecified  05/05/2021 No Yes L97.512 Non-pressure chronic ulcer of other part of right foot with fat layer exposed 06/07/2021 No Yes L97.522 Non-pressure chronic ulcer of other part of left foot with fat layer exposed 06/07/2021 No Yes L89.612 Pressure ulcer of right heel, stage 2 07/06/2021 No Yes Inactive Problems ICD-10 Code Description Active Date Inactive Date L89.622 Pressure ulcer of left heel, stage 2 05/05/2021 05/05/2021 Resolved Problems Electronic Signature(s) Signed: 07/06/2021 1:40:00 PM By: Fredirick Maudlin MD FACS Entered By: Fredirick Maudlin on 07/06/2021 13:39:59 -------------------------------------------------------------------------------- Progress Note Details Patient Name: Date of Service: Gregory [name], [name] 07/06/2021 12:30 PM Medical Record Number: [id number] Patient Account Number: [id number] Date of Birth/Sex: Treating RN: Feb 26, 1920 (88 y.o. [name] Primary Care Provider: Wylene Simmer Other Clinician: Referring Provider: Treating Provider/Extender: Roxy Cedar in Treatment: 8 Subjective Chief Complaint Information obtained from Patient Patient is at the clinic for treatment of an open pressure ulcer on his left heel and dry gangrene of both great toes History of Present Illness (HPI) ADMISSION 05/05/2021 This is an extremely frail 88 year old man who presents to clinic today accompanied by his son for evaluation of a pressure ulcer on his left heel. He also has dry gangrene on the tips of his bilateral great toes and the lateral aspect of both feet. It sounds like after a hospitalization for a GI bleed, he had a blanket on his feet which ultimately resulted in a pressure injury. He has severe dysphagia and is dependent on G-tube feeding. The wound on his heel has been present since January. He has 24-hour care and they have been applying  Xeroform to the heel. He apparently has some sort of air boot that he is meant to wear in bed at night. He has severe peripheral arterial disease as evidenced by the ABIs obtained by Dr. Rachelle Hora and copied here: ABI Findings: +---------+------------------+-----+----------+--------+ Right Rt Pressure (mmHg)IndexWaveform Comment  +---------+------------------+-----+----------+--------+ Brachial 136    +---------+------------------+-----+----------+--------+ PTA >255 [place] monophasic  +---------+------------------+-----+----------+--------+ DP >255 [place] monophasic  +---------+------------------+-----+----------+--------+ Great T oe24 0.18    +---------+------------------+-----+----------+--------+ +---------+------------------+-----+----------+-------+ Left Lt Pressure (mmHg)IndexWaveform Comment +---------+------------------+-----+----------+-------+ Brachial 136    +---------+------------------+-----+----------+-------+ PTA >255 [place] monophasic  +---------+------------------+-----+----------+-------+ DP >255 [place] monophasic  +---------+------------------+-----+----------+-------+ Great T oe0 0.00 Absent   +---------+------------------+-----+----------+-------+ +-------+-----------+-----------+------------+------------+ ABI/TBIT oday's ABIT oday's TBIPrevious ABIPrevious TBI +-------+-----------+-----------+------------+------------+ Right [place] 0.18    +-------+-----------+-----------+------------+------------+ Left [place] 0.00    +-------+-----------+-----------+------------+------------+ Summary: Right: Resting right ankle-brachial index indicates noncompressible right lower extremity arteries. The right toe-brachial index is abnormal. Left: Resting left ankle-brachial index indicates noncompressible left lower extremity arteries. Absent left great toe PPG waveform. He saw Dr. Rachelle Hora on March 16 and at that  time, he was felt to be far too frail to undergo any sort of intervention including an aortogram. Certainly not a surgical candidate. According to the electronic medical record, Dr. Rachelle Hora is planning to see him back in a couple of weeks and Gregory determine if he has made any significant improvement in his overall status and might be suitable for an intervention. I read his note to suggest that the patient has had multiple complications from other procedures and that even an aortogram may be too risky. 05/12/2021: Rather remarkably, the eschar and dry gangrene on his bilateral great toes and bilateral fifth metatarsals actually looks better. Some of the eschar on his toes is peeling away to reveal healing tissue underneath. This is also the case with his lateral foot bilaterally. The heel is clean and superficial, with minimal slough. 06/07/2021:  Since our last visit, Mr. Marschall has been in the hospital quite a bit. While in the hospital, they were painting his lateral feet and great toes with Betadine. The wound on his heel is nearly closed with just a sliver of open skin. They have been floating his heels as directed and his home health nurse recently ordered a device to keep his blankets from rubbing on his toes. They have continued using the Betadine on his lateral feet and great toes and the Prisma silver collagen on his left heel. He does have a bit of callus buildup on his right Achilles tendon area suggesting there is some pressure being applied. 06/15/2021: The left heel wound is closed. There is some slough buildup in the right lateral foot wound. Both great toe wounds have a layer of thick dark eschar, but they appear to have some viable tissue underneath this. He is complaining of pain in his right heel; he had a wound there at one time. On inspection, there is no open skin at this site. 06/23/2021: The left heel remains closed. The right heel never has opened. Both great toe wounds continue  to have a layer of thick dark eschar, but it is softer today. The right lateral foot wound also has eschar overlying the surface which has some accumulated slough. He also has areas on his great toes bilaterally that look like they have been rubbing on the slippers that he wears all day. 07/06/2021: The right heel has a new callus on it. It peeled off as I was examining the site and there is a small open wound in that area. The right great toe looks worse today; there are more darkened areas of eschar and the toe itself feels a bit boggy. The right lateral foot is small with just a small amount of slough. The left great toe is in reasonable condition with just a layer of fairly soft eschar at the tip. According to the patient's family, he has been insisting upon blankets being wrapped over his feet at night and he does not cooperate with heel flotation. He also required a 2 unit blood transfusion last week for fairly significant anemia. Patient History Information obtained from Patient, Caregiver. Family History Unknown History. Social History Never smoker, Marital Status - Widowed, Alcohol Use - Never, Drug Use - No History, Caffeine Use - Never. Medical History Cardiovascular Patient has history of Arrhythmia - A-Fib, Hypertension Medical A Surgical History Notes nd Cardiovascular Pacemaker, Complete heart block Gastrointestinal Dysphagia, PEG tube in place Endocrine Hypothyroidism Genitourinary CKD-3 Neurologic Neuromuscular disorder Objective Constitutional He is cachectic. Chronically ill-appearing but in no distress.. Vitals Time Taken: 12:57 PM, Temperature: 97.7 F, Pulse: 79 bpm, Respiratory Rate: 16 breaths/min, Blood Pressure: 127/56 mmHg. Respiratory Normal work of breathing on room air. General Notes: 07/06/2021: The right heel has a new callus on it. It peeled off as I was examining the site and there is a small open wound in that area. The right great toe looks worse  today; there are more darkened areas of eschar and the toe itself feels a bit boggy. The right lateral foot is small with just a small amount of slough. The left great toe is in reasonable condition with just a layer of fairly soft eschar at the tip Integumentary (Hair, Skin) Wound #1 status is Open. Original cause of wound was Pressure Injury. The date acquired was: 02/12/2021. The wound has been in treatment 8 weeks. The wound is located on the Right T  Great. The wound measures 1.7cm length x 1.5cm width x 0.1cm depth; 2.003cm^2 area and 0.2cm^3 volume. There is Fat oe Layer (Subcutaneous Tissue) exposed. There is no tunneling or undermining noted. There is a medium amount of serosanguineous drainage noted. The wound margin is flat and intact. There is no granulation within the wound bed. There is a large (67-100%) amount of necrotic tissue within the wound bed including Eschar. Wound #2 status is Open. Original cause of wound was Pressure Injury. The date acquired was: 02/12/2021. The wound has been in treatment 8 weeks. The wound is located on the Left T Great. The wound measures 1.6cm length x 2cm width x 0.1cm depth; 2.513cm^2 area and 0.251cm^3 volume. There is Fat oe Layer (Subcutaneous Tissue) exposed. There is no tunneling or undermining noted. There is a small amount of serosanguineous drainage noted. The wound margin is flat and intact. There is no granulation within the wound bed. There is a large (67-100%) amount of necrotic tissue within the wound bed including Eschar. Wound #4 status is Open. Original cause of wound was Gradually Appeared. The date acquired was: 04/12/2021. The wound has been in treatment 4 weeks. The wound is located on the Right,Lateral Foot. The wound measures 0.5cm length x 0.4cm width x 0.1cm depth; 0.157cm^2 area and 0.016cm^3 volume. There is Fat Layer (Subcutaneous Tissue) exposed. There is no tunneling or undermining noted. There is a small amount of  serosanguineous drainage noted. The wound margin is flat and intact. There is no granulation within the wound bed. There is a large (67-100%) amount of necrotic tissue within the wound bed including Eschar. Wound #5 status is Open. Original cause of wound was Blister. The date acquired was: 06/12/2021. The wound has been in treatment 1 weeks. The wound is located on the Right,Medial T Great. The wound measures 2cm length x 3cm width x 0.1cm depth; 4.712cm^2 area and 0.471cm^3 volume. There is Fat Layer oe (Subcutaneous Tissue) exposed. There is no tunneling or undermining noted. There is a medium amount of serosanguineous drainage noted. There is small (1- 33%) red granulation within the wound bed. There is a large (67-100%) amount of necrotic tissue within the wound bed including Eschar and Adherent Slough. Wound #6 status is Open. Original cause of wound was Pressure Injury. The date acquired was: 06/24/2021. The wound is located on the Right Calcaneus. The wound measures 0.2cm length x 0.2cm width x 0.2cm depth; 0.031cm^2 area and 0.006cm^3 volume. There is Fat Layer (Subcutaneous Tissue) exposed. There is no tunneling or undermining noted. There is a medium amount of serosanguineous drainage noted. There is medium (34-66%) pink granulation within the wound bed. There is a medium (34-66%) amount of necrotic tissue within the wound bed including Eschar and Adherent Slough. Assessment Active Problems ICD-10 Atherosclerosis of native arteries of extremities with gangrene, bilateral legs Peripheral vascular disease, unspecified Unspecified severe protein-calorie malnutrition Chronic kidney disease, stage 3a Dysphagia, unspecified Non-pressure chronic ulcer of other part of right foot with fat layer exposed Non-pressure chronic ulcer of other part of left foot with fat layer exposed Pressure ulcer of right heel, stage 2 Procedures Wound #1 Pre-procedure diagnosis of Wound #1 is an Arterial  Insufficiency Ulcer located on the Right T Great .Severity of Tissue Pre Debridement is: Fat layer oe exposed. There was a Selective/Open Wound Non-Viable Tissue Debridement with a total area of 2.55 sq cm performed by Fredirick Maudlin, MD. With the following instrument(s): Curette to remove Non-Viable tissue/material. Material removed includes Eschar and [facility]  and after achieving pain control using Lidocaine 4% T opical Solution. No specimens were taken. A time out was conducted at 13:27, prior to the start of the procedure. A Minimum amount of bleeding was controlled with Pressure. The procedure was tolerated well with a pain level of 0 throughout and a pain level of 0 following the procedure. Post Debridement Measurements: 1.7cm length x 1.5cm width x 0.1cm depth; 0.2cm^3 volume. Character of Wound/Ulcer Post Debridement is improved. Severity of Tissue Post Debridement is: Fat layer exposed. Post procedure Diagnosis Wound #1: Same as Pre-Procedure Wound #2 Pre-procedure diagnosis of Wound #2 is an Arterial Insufficiency Ulcer located on the Left T Great .Severity of Tissue Pre Debridement is: Fat layer oe exposed. There was a Selective/Open Wound Non-Viable Tissue Debridement with a total area of 3.2 sq cm performed by Fredirick Maudlin, MD. With the following instrument(s): Curette to remove Non-Viable tissue/material. Material removed includes Eschar, Callus, and Slough after achieving pain control using Lidocaine 4% T opical Solution. No specimens were taken. A time out was conducted at 13:27, prior to the start of the procedure. A Minimum amount of bleeding was controlled with Pressure. The procedure was tolerated well with a pain level of 0 throughout and a pain level of 0 following the procedure. Post Debridement Measurements: 1.6cm length x 2cm width x 0.1cm depth; 0.251cm^3 volume. Character of Wound/Ulcer Post Debridement is improved. Severity of Tissue Post Debridement is: Fat layer  exposed. Post procedure Diagnosis Wound #2: Same as Pre-Procedure Wound #4 Pre-procedure diagnosis of Wound #4 is a Pressure Ulcer located on the Right,Lateral Foot . There was a Selective/Open Wound Non-Viable Tissue Debridement with a total area of 0.2 sq cm performed by Fredirick Maudlin, MD. With the following instrument(s): Curette to remove Non-Viable tissue/material. Material removed includes Eschar and Slough and after achieving pain control using Lidocaine 4% T opical Solution. No specimens were taken. A time out was conducted at 13:27, prior to the start of the procedure. A Minimum amount of bleeding was controlled with Pressure. The procedure was tolerated well with a pain level of 0 throughout and a pain level of 0 following the procedure. Post Debridement Measurements: 0.5cm length x 0.4cm width x 0.1cm depth; 0.016cm^3 volume. Post debridement Stage noted as Category/Stage II. Character of Wound/Ulcer Post Debridement is improved. Post procedure Diagnosis Wound #4: Same as Pre-Procedure Wound #5 Pre-procedure diagnosis of Wound #5 is a Pressure Ulcer located on the Right,Medial T Great . There was a Selective/Open Wound Non-Viable Tissue oe Debridement with a total area of 6 sq cm performed by Fredirick Maudlin, MD. With the following instrument(s): Curette to remove Non-Viable tissue/material. Material removed includes Eschar and Slough and after achieving pain control using Lidocaine 4% T opical Solution. No specimens were taken. A time out was conducted at 13:27, prior to the start of the procedure. A Minimum amount of bleeding was controlled with Pressure. The procedure was tolerated well with a pain level of 0 throughout and a pain level of 0 following the procedure. Post Debridement Measurements: 2cm length x 3cm width x 0.1cm depth; 0.471cm^3 volume. Post debridement Stage noted as Category/Stage II. Character of Wound/Ulcer Post Debridement is improved. Post procedure  Diagnosis Wound #5: Same as Pre-Procedure Plan Follow-up Appointments: Return Appointment in 1 week. - Dr. Celine Ahr Room 3 Bathing/ Shower/ Hygiene: May shower and wash wound with soap and water. Edema Control - Lymphedema / SCD / Other: Elevate legs to the level of the heart or above for 30 minutes  daily and/or when sitting, a frequency of: - throughout the day Avoid standing for long periods of time. Non Wound Condition: Protect area with: - Right and Left heels protect with Heel Cups Home Health: Other Home Health Orders/Instructions: - [facility] WOUND #1: - T Great Wound Laterality: Right oe Cleanser: Soap and Water 1 x Per Day/30 Days Discharge Instructions: May shower and wash wound with dial antibacterial soap and water prior to dressing change. Cleanser: Wound Cleanser 1 x Per Day/30 Days Discharge Instructions: Cleanse the wound with wound cleanser or normal saline prior to applying a clean dressing using gauze sponges, not tissue or cotton balls. Prim Dressing: Iodosorb Gel 10 (gm) Tube (Home Health) (Generic) 1 x Per Day/30 Days ary Discharge Instructions: Apply to wound bed as instructed Secondary Dressing: Woven Gauze Sponges 2x2 in ([place]) 1 x Per Day/30 Days Discharge Instructions: Apply over primary dressing as directed. Secured With: Child psychotherapist, Sterile 2x75 (in/in) (Home Health) 1 x Per Day/30 Days Discharge Instructions: Secure with stretch gauze as directed. WOUND #2: - T Great Wound Laterality: Left oe Cleanser: Soap and Water 1 x Per Day/30 Days Discharge Instructions: May shower and wash wound with dial antibacterial soap and water prior to dressing change. Cleanser: Wound Cleanser 1 x Per Day/30 Days Discharge Instructions: Cleanse the wound with wound cleanser or normal saline prior to applying a clean dressing using gauze sponges, not tissue or cotton balls. Prim Dressing: Iodosorb Gel 10 (gm) Tube (Home Health) (Generic) 1 x Per  Day/30 Days ary Discharge Instructions: Apply to wound bed as instructed Secondary Dressing: Woven Gauze Sponges 2x2 in ([place]) 1 x Per Day/30 Days Discharge Instructions: Apply over primary dressing as directed. Secured With: Child psychotherapist, Sterile 2x75 (in/in) (Home Health) 1 x Per Day/30 Days Discharge Instructions: Secure with stretch gauze as directed. WOUND #4: - Foot Wound Laterality: Right, Lateral Cleanser: Soap and Water (Home Health) 1 x Per Day/30 Days Discharge Instructions: May shower and wash wound with dial antibacterial soap and water prior to dressing change. Prim Dressing: Iodosorb Gel 10 (gm) Tube (Home Health) (Generic) 1 x Per Day/30 Days ary Discharge Instructions: Apply to wound bed as instructed Secondary Dressing: ALLEVYN Gentle Border, 3x3 (in/in) 1 x Per Day/30 Days Discharge Instructions: Apply over primary dressing as directed. WOUND #5: - T Great oe Wound Laterality: Right, Medial Cleanser: Soap and Water (Home Health) 1 x Per Day/30 Days Discharge Instructions: May shower and wash wound with dial antibacterial soap and water prior to dressing change. Prim Dressing: KerraCel Ag Gelling Fiber Dressing, 2x2 in (silver alginate) 1 x Per Day/30 Days ary Discharge Instructions: Apply silver alginate to wound bed as instructed Secondary Dressing: ALLEVYN Gentle Border, 3x3 (in/in) 1 x Per Day/30 Days Discharge Instructions: Apply over primary dressing as directed. 07/06/2021: The right heel has a new callus on it. It peeled off as I was examining the site and there is a small open wound in that area. The right great toe looks worse today; there are more darkened areas of eschar and the toe itself feels a bit boggy. The right lateral foot is small with just a small amount of slough. The left great toe is in reasonable condition with just a layer of fairly soft eschar at the tip. I used a curette to debride the slough from the right lateral  foot wound and the eschar from both great toes. I am concerned about the condition of the right great toe as  it clearly has deteriorated in the last weeks. I am going to go back to painting that wound with Betadine daily, as the toe feels a bit boggy and I want to dry it out. The new wound on his right heel and the right lateral foot wound are both clean and so I am going to apply silver alginate to both of these sites. We Gregory continue using Iodosorb to the tip of the left great toe. I reemphasized to the patient the importance of avoiding any sort of pressure on his feet both from blankets and clothing or footwear as well as offloading his heels. I Gregory see him in 1 week's time. Electronic Signature(s) Signed: 07/06/2021 2:21:10 PM By: Fredirick Maudlin MD FACS Entered By: Fredirick Maudlin on 07/06/2021 14:21:10 -------------------------------------------------------------------------------- HxROS Details Patient Name: Date of Service: Gregory [name], [name] 07/06/2021 12:30 PM Medical Record Number: [id number] Patient Account Number: [id number] Date of Birth/Sex: Treating RN: [birth year] (88 y.o. [name] Primary Care Provider: Wylene Simmer Other Clinician: Referring Provider: Treating Provider/Extender: Roxy Cedar in Treatment: 8 Information Obtained From Patient Caregiver Cardiovascular Medical History: Positive for: Arrhythmia - A-Fib; Hypertension Past Medical History Notes: Pacemaker, Complete heart block Gastrointestinal Medical History: Past Medical History Notes: Dysphagia, PEG tube in place Endocrine Medical History: Past Medical History Notes: Hypothyroidism Genitourinary Medical History: Past Medical History Notes: CKD-3 Neurologic Medical History: Past Medical History Notes: Neuromuscular disorder Immunizations Pneumococcal Vaccine: Received Pneumococcal Vaccination: Yes Received Pneumococcal Vaccination On or After 60th  Birthday: No Implantable Devices Yes Family and Social History Unknown History: Yes; Never smoker; Marital Status - Widowed; Alcohol Use: Never; Drug Use: No History; Caffeine Use: Never; Financial Concerns: No; Food, Clothing or Shelter Needs: No; Support System Lacking: No; Transportation Concerns: No Electronic Signature(s) Signed: 07/06/2021 2:38:58 PM By: Fredirick Maudlin MD FACS Signed: 07/06/2021 6:59:07 PM By: Dellie Catholic RN Entered By: Fredirick Maudlin on 07/06/2021 13:42:21 -------------------------------------------------------------------------------- SuperBill Details Patient Name: Date of Service: Gregory [name], [name] 07/06/2021 Medical Record Number: [id number] Patient Account Number: [id number] Date of Birth/Sex: Treating RN: [birth year] (88 y.o. [name] Primary Care Provider: Wylene Simmer Other Clinician: Referring Provider: Treating Provider/Extender: Roxy Cedar in Treatment: 8 Diagnosis Coding ICD-10 Codes Code Description [phone] Atherosclerosis of native arteries of extremities with gangrene, bilateral legs I73.9 Peripheral vascular disease, unspecified E43 Unspecified severe protein-calorie malnutrition N18.31 Chronic kidney disease, stage 3a R13.10 Dysphagia, unspecified L97.512 Non-pressure chronic ulcer of other part of right foot with fat layer exposed L97.522 Non-pressure chronic ulcer of other part of left foot with fat layer exposed L89.612 Pressure ulcer of right heel, stage 2 Facility Procedures CPT4 Code: [id number] Description: [phone] - DEBRIDE WOUND 1ST 20 SQ CM OR < ICD-10 Diagnosis Description L97.512 Non-pressure chronic ulcer of other part of right foot with fat layer exposed L97.522 Non-pressure chronic ulcer of other part of left foot with fat layer exposed  L89.612 Pressure ulcer of right heel, stage 2 I70.263 Atherosclerosis of native arteries of extremities with gangrene, bilateral  legs Modifier: Quantity: 1 Physician Procedures : CPT4 Code Description Modifier [id number] - WC PHYS LEVEL 3 - EST PT 25 ICD-10 Diagnosis Description I70.263 Atherosclerosis of native arteries of extremities with gangrene, bilateral legs L97.512 Non-pressure chronic ulcer of other part of right  foot with fat layer exposed L97.522 Non-pressure chronic ulcer of other part of left foot with fat layer exposed L89.612 Pressure ulcer of right heel, stage 2 Quantity: 1 : [id number] -  WC PHYS DEBR WO ANESTH 20 SQ CM ICD-10 Diagnosis Description L97.512 Non-pressure chronic ulcer of other part of right foot with fat layer exposed L97.522 Non-pressure chronic ulcer of other part of left foot with fat layer exposed  L89.612 Pressure ulcer of right heel, stage 2 I70.263 Atherosclerosis of native arteries of extremities with gangrene, bilateral legs Quantity: 1 Electronic Signature(s) Signed: 07/06/2021 2:21:49 PM By: Fredirick Maudlin MD FACS Entered By: Fredirick Maudlin on 07/06/2021 14:21:48

## 2021-07-10 ENCOUNTER — Other Ambulatory Visit: Payer: Self-pay | Admitting: Physical Medicine and Rehabilitation

## 2021-07-13 ENCOUNTER — Encounter (HOSPITAL_BASED_OUTPATIENT_CLINIC_OR_DEPARTMENT_OTHER): Payer: Medicare Other | Attending: General Surgery | Admitting: General Surgery

## 2021-07-13 DIAGNOSIS — Z950 Presence of cardiac pacemaker: Secondary | ICD-10-CM | POA: Diagnosis not present

## 2021-07-13 DIAGNOSIS — L97512 Non-pressure chronic ulcer of other part of right foot with fat layer exposed: Secondary | ICD-10-CM | POA: Insufficient documentation

## 2021-07-13 DIAGNOSIS — E43 Unspecified severe protein-calorie malnutrition: Secondary | ICD-10-CM | POA: Insufficient documentation

## 2021-07-13 DIAGNOSIS — I4891 Unspecified atrial fibrillation: Secondary | ICD-10-CM | POA: Insufficient documentation

## 2021-07-13 DIAGNOSIS — Z931 Gastrostomy status: Secondary | ICD-10-CM | POA: Insufficient documentation

## 2021-07-13 DIAGNOSIS — L89612 Pressure ulcer of right heel, stage 2: Secondary | ICD-10-CM | POA: Insufficient documentation

## 2021-07-13 DIAGNOSIS — I129 Hypertensive chronic kidney disease with stage 1 through stage 4 chronic kidney disease, or unspecified chronic kidney disease: Secondary | ICD-10-CM | POA: Diagnosis not present

## 2021-07-13 DIAGNOSIS — L97522 Non-pressure chronic ulcer of other part of left foot with fat layer exposed: Secondary | ICD-10-CM | POA: Diagnosis not present

## 2021-07-13 DIAGNOSIS — I70263 Atherosclerosis of native arteries of extremities with gangrene, bilateral legs: Secondary | ICD-10-CM | POA: Diagnosis not present

## 2021-07-13 DIAGNOSIS — N1832 Chronic kidney disease, stage 3b: Secondary | ICD-10-CM | POA: Insufficient documentation

## 2021-07-13 NOTE — Progress Notes (Signed)
Gregory [name], Gregory [name] ([id number]) Visit Report for 07/13/2021 Arrival Information Details Patient Name: Date of Service: Gregory [name] RNO LD 07/13/2021 1:15 PM Medical Record Number: [id number] Patient Account Number: [id number] Date of Birth/Sex: Treating RN: 02-09-1921 (88 y.o. Gregory [name] Primary Care Gregory [name]: Wylene Simmer Other Clinician: Referring Gregory [name]: Treating Gregory [name]/Extender: Roxy Cedar in Treatment: 9 Visit Information History Since Last Visit Added or deleted any medications: No Patient Arrived: Wheel Chair Any new allergies or adverse reactions: No Arrival Time: 13:21 Had Gregory [name] fall or experienced change in No Accompanied By: son activities of daily living that may affect Transfer Assistance: Manual risk of falls: Patient Requires Transmission-Based Precautions: No Signs or symptoms of abuse/neglect since last visito No Patient Has Alerts: Yes Hospitalized since last visit: No Patient Alerts: R ABI: [place] TBI: 0.18 Implantable device outside of the clinic excluding No L ABI: [place] TBI: 0.0 cellular tissue based products placed in the center since last visit: Has Dressing in Place as Prescribed: Yes Pain Present Now: No Electronic Signature(s) Signed: 07/13/2021 5:59:26 PM By: Sharyn Creamer RN, BSN Entered By: Sharyn Creamer on 07/13/2021 13:22:16 -------------------------------------------------------------------------------- Encounter Discharge Information Details Patient Name: Date of Service: Gregory [name], Gregory [name] RNO LD 07/13/2021 1:15 PM Medical Record Number: [id number] Patient Account Number: [id number] Date of Birth/Sex: Treating RN: 1920-08-10 (88 y.o. Gregory [name] Primary Care Gregory [name]: Wylene Simmer Other Clinician: Referring Mickal Meno: Treating Gregory [name]/Extender: Roxy Cedar in Treatment: 9 Encounter Discharge Information Items Post Procedure Vitals Discharge Condition: Stable Temperature (F):  97.7 Ambulatory Status: Wheelchair Pulse (bpm): 78 Discharge Destination: Home Respiratory Rate (breaths/min): 18 Transportation: Private Auto Blood Pressure (mmHg): 125/66 Accompanied By: friend/son Schedule Follow-up Appointment: Yes Clinical Summary of Care: Patient Declined Electronic Signature(s) Signed: 07/13/2021 5:42:18 PM By: Dellie Catholic RN Entered By: Dellie Catholic on 07/13/2021 17:34:56 -------------------------------------------------------------------------------- Lower Extremity Assessment Details Patient Name: Date of Service: Gregory [name] RNO LD 07/13/2021 1:15 PM Medical Record Number: [id number] Patient Account Number: [id number] Date of Birth/Sex: Treating RN: 1920/05/12 (88 y.o. Gregory [name] Primary Care Gregory [name]: Wylene Simmer Other Clinician: Referring Gregory [name]: Treating Dilon Lank/Extender: Roxy Cedar in Treatment: 9 Edema Assessment Assessed: [Left: No] [Right: No] E[Left: dema] [Right: :] Calf Left: Right: Point of Measurement: From Medial Instep 29 cm 28.8 cm Ankle Left: Right: Point of Measurement: From Medial Instep 19 cm 18.7 cm Vascular Assessment Pulses: Dorsalis Pedis Palpable: [Left:Yes] [Right:Yes] Electronic Signature(s) Signed: 07/13/2021 5:59:26 PM By: Sharyn Creamer RN, BSN Entered By: Sharyn Creamer on 07/13/2021 13:28:57 -------------------------------------------------------------------------------- Multi Wound Chart Details Patient Name: Date of Service: Gregory [name], Gregory [name] RNO LD 07/13/2021 1:15 PM Medical Record Number: [id number] Patient Account Number: [id number] Date of Birth/Sex: Treating RN: July 15, 1920 (88 y.o. Gregory [name] Primary Care Gregory [name]: Wylene Simmer Other Clinician: Referring Gregory [name]: Treating Gregory [name]/Extender: Roxy Cedar in Treatment: 9 Vital Signs Height(in): Pulse(bpm): 57 Weight(lbs): Blood Pressure(mmHg): 125/66 Body Mass  Index(BMI): Temperature(F): 97.7 Respiratory Rate(breaths/min): 18 Photos: [1:Right T Great oe] [2:Left T Great oe] [4:Right, Lateral Foot] Wound Location: [1:Pressure Injury] [2:Pressure Injury] [4:Gradually Appeared] Wounding Event: [1:Arterial Insufficiency Ulcer] [2:Arterial Insufficiency Ulcer] [4:Pressure Ulcer] Primary Etiology: [1:Arrhythmia, Hypertension] [2:Arrhythmia, Hypertension] [4:Arrhythmia, Hypertension] Comorbid History: [1:02/12/2021] [2:02/12/2021] [4:04/12/2021] Date Acquired: [1:9] [2:9] [4:5] Weeks of Treatment: [1:Open] [2:Open] [4:Open] Wound Status: [1:No] [2:No] [4:No] Wound Recurrence: [1:1.2x1.2x0.1] [2:1.4x1.7x0.1] [4:0.6x0.4x0.1] Measurements L x W x D (cm) [1:1.131] [2:1.869] [4:0.188] Gregory [name] (cm) : rea [1:0.113] [2:0.187] [4:0.019] Volume (cm) : [1:76.00%] [2:52.40%] [4:-300.00%] % Reduction in Gregory [name] rea: [1:76.00%] [  2:52.40%] [4:-280.00%] % Reduction in Volume: [1:Full Thickness Without Exposed] [2:Unclassifiable] [4:Category/Stage II] Classification: [1:Support Structures Medium] [2:Small] [4:Small] Exudate Gregory [name] mount: [1:Serosanguineous] [2:Serosanguineous] [4:Serosanguineous] Exudate Type: [1:red, brown] [2:red, brown] [4:red, brown] Exudate Color: [1:Flat and Intact] [2:Flat and Intact] [4:Flat and Intact] Wound Margin: [1:None Present (0%)] [2:None Present (0%)] [4:None Present (0%)] Granulation Gregory [name] mount: [1:N/Gregory [name]] [2:N/Gregory [name]] [4:N/Gregory [name]] Granulation Quality: [1:Large (67-100%)] [2:Large (67-100%)] [4:Large (67-100%)] Necrotic Gregory [name] mount: [1:Eschar] [2:Eschar] [4:Eschar] Necrotic Tissue: [1:Fat Layer (Subcutaneous Tissue): Yes Fat Layer (Subcutaneous Tissue): Yes Fat Layer (Subcutaneous Tissue): Yes] Exposed Structures: [1:Fascia: No Tendon: No Muscle: No Joint: No Bone: No Large (67-100%)] [2:Fascia: No Tendon: No Muscle: No Joint: No Bone: No None] [4:Fascia: No Tendon: No Muscle: No Joint: No Bone: No Small (1-33%)] Epithelialization: [1:Debridement - Excisional]  [2:Debridement - Excisional] [4:Debridement - Excisional] Debridement: Pre-procedure Verification/Time Out 14:09 [2:14:09] [4:14:09] Taken: [1:Other] [2:Other] [4:Other] Pain Control: [1:Necrotic/Eschar, Subcutaneous] [2:Necrotic/Eschar, Subcutaneous] [4:Subcutaneous, Slough] Tissue Debrided: [1:Skin/Subcutaneous Tissue] [2:Skin/Subcutaneous Tissue] [4:Skin/Subcutaneous Tissue] Level: [1:1.44] [2:2.38] [4:0.24] Debridement Gregory [name] (sq cm): [1:rea Curette] [2:Curette] [4:Curette] Instrument: [1:Minimum] [2:Minimum] [4:Minimum] Bleeding: [1:Pressure] [2:Pressure] [4:Pressure] Hemostasis Gregory [name] chieved: [1:0] [2:0] [4:0] Procedural Pain: [1:0] [2:0] [4:0] Post Procedural Pain: [1:Procedure was tolerated well] [2:Procedure was tolerated well] [4:Procedure was tolerated well] Debridement Treatment Response: [1:1.2x1.2x0.1] [2:1.4x1.7x0.1] [4:0.6x0.4x0.1] Post Debridement Measurements L x W x D (cm) [1:0.113] [2:0.187] [4:0.019] Post Debridement Volume: (cm) [1:N/Gregory [name]] [2:N/Gregory [name]] [4:Category/Stage II] Post Debridement Stage: [1:Debridement] [2:Debridement] [4:Debridement] Wound Number: 5 6 N/Gregory [name] Photos: N/Gregory [name] Right, Medial T Great oe Right Calcaneus N/Gregory [name] Wound Location: Blister Pressure Injury N/Gregory [name] Wounding Event: Pressure Ulcer Pressure Ulcer N/Gregory [name] Primary Etiology: Arrhythmia, Hypertension Arrhythmia, Hypertension N/Gregory [name] Comorbid History: 06/12/2021 06/24/2021 N/Gregory [name] Date Acquired: 2 1 N/Gregory [name] Weeks of Treatment: Open Healed - Epithelialized N/Gregory [name] Wound Status: No No N/Gregory [name] Wound Recurrence: 2x2.4x0.1 0x0x0 N/Gregory [name] Measurements L x W x D (cm) 3.77 0 N/Gregory [name] Gregory [name] (cm) : rea 0.377 0 N/Gregory [name] Volume (cm) : -5209.90% 100.00% N/Gregory [name] % Reduction in Gregory [name] rea: -5285.70% 100.00% N/Gregory [name] % Reduction in Volume: Category/Stage II Category/Stage II N/Gregory [name] Classification: Medium None Present N/Gregory [name] Exudate Gregory [name] mount: Serosanguineous N/Gregory [name] N/Gregory [name] Exudate Type: red, brown N/Gregory [name] N/Gregory [name] Exudate Color: N/Gregory [name] N/Gregory [name] N/Gregory [name] Wound Margin: Small (1-33%) None Present (0%)  N/Gregory [name] Granulation Gregory [name] mount: Red N/Gregory [name] N/Gregory [name] Granulation Quality: Large (67-100%) None Present (0%) N/Gregory [name] Necrotic Gregory [name] mount: Eschar, Adherent Slough N/Gregory [name] N/Gregory [name] Necrotic Tissue: Fat Layer (Subcutaneous Tissue): Yes Fascia: No N/Gregory [name] Exposed Structures: Fascia: No Fat Layer (Subcutaneous Tissue): No Tendon: No Tendon: No Muscle: No Muscle: No Joint: No Joint: No Bone: No Bone: No None Large (67-100%) N/Gregory [name] Epithelialization: Debridement - Excisional N/Gregory [name] N/Gregory [name] Debridement: Pre-procedure Verification/Time Out 14:09 N/Gregory [name] N/Gregory [name] Taken: Other N/Gregory [name] N/Gregory [name] Pain Control: Necrotic/Eschar, Subcutaneous N/Gregory [name] N/Gregory [name] Tissue Debrided: Skin/Subcutaneous Tissue N/Gregory [name] N/Gregory [name] Level: 4.8 N/Gregory [name] N/Gregory [name] Debridement Gregory [name] (sq cm): rea Curette N/Gregory [name] N/Gregory [name] Instrument: Minimum N/Gregory [name] N/Gregory [name] Bleeding: Pressure N/Gregory [name] N/Gregory [name] Hemostasis Gregory [name] chieved: 0 N/Gregory [name] N/Gregory [name] Procedural Pain: 0 N/Gregory [name] N/Gregory [name] Post Procedural Pain: Procedure was tolerated well N/Gregory [name] N/Gregory [name] Debridement Treatment Response: 2x2.4x0.1 N/Gregory [name] N/Gregory [name] Post Debridement Measurements L x W x D (cm) 0.377 N/Gregory [name] N/Gregory [name] Post Debridement Volume: (cm) Category/Stage II N/Gregory [name] N/Gregory [name] Post Debridement Stage: Debridement N/Gregory [name] N/Gregory [name] Procedures Performed: Treatment Notes Electronic Signature(s) Signed: 07/13/2021 2:50:16 PM By: Fredirick Maudlin MD FACS Signed: 07/13/2021 5:42:18 PM By: Dellie Catholic RN Entered By: Fredirick Maudlin on 07/13/2021 14:50:16 -------------------------------------------------------------------------------- Multi-Disciplinary Care Plan Details Patient Name: Date of Service: Gregory [name], Gregory [name] RNO LD 07/13/2021 1:15 PM Medical Record Number: [id number] Patient Account Number: [id number] Date of Birth/Sex:  Treating RN: May 05, 1920 (88 y.o. Gregory [name] Primary Care Alijah Hyde: Wylene Simmer Other Clinician: Referring Shiquan Mathieu: Treating Nahun Kronberg/Extender: Roxy Cedar in Treatment: 9 Multidisciplinary Care Plan reviewed with physician Active Inactive Abuse / Safety / Falls  / Self Care Management Nursing Diagnoses: Potential for falls Potential for injury related to falls Goals: Patient Gregory not experience any injury related to falls Date Initiated: 05/05/2021 Target Resolution Date: 08/11/2021 Goal Status: Active Patient/caregiver Gregory verbalize/demonstrate measures taken to prevent injury and/or falls Date Initiated: 05/05/2021 Target Resolution Date: 08/11/2021 Goal Status: Active Interventions: Assess Activities of Daily Living upon admission and as needed Assess fall risk on admission and as needed Assess: immobility, friction, shearing, incontinence upon admission and as needed Assess impairment of mobility on admission and as needed per policy Assess personal safety and home safety (as indicated) on admission and as needed Assess self care needs on admission and as needed Provide education on fall prevention Provide education on personal and home safety Notes: Nutrition Nursing Diagnoses: Potential for alteratiion in Nutrition/Potential for imbalanced nutrition Goals: Patient/caregiver agrees to and verbalizes understanding of need to use nutritional supplements and/or vitamins as prescribed Date Initiated: 05/05/2021 Target Resolution Date: 08/11/2021 Goal Status: Active Interventions: Assess HgA1c results as ordered upon admission and as needed Assess patient nutrition upon admission and as needed per policy Provide education on elevated blood sugars and impact on wound healing Provide education on nutrition Treatment Activities: Education provided on Nutrition : 05/05/2021 Notes: Wound/Skin Impairment Nursing Diagnoses: Impaired tissue integrity Knowledge deficit related to ulceration/compromised skin integrity Goals: Patient/caregiver Gregory verbalize understanding of skin care regimen Date Initiated: 05/05/2021 Target Resolution Date: 08/11/2021 Goal Status: Active Interventions: Assess patient/caregiver ability to obtain necessary  supplies Assess patient/caregiver ability to perform ulcer/skin care regimen upon admission and as needed Assess ulceration(s) every visit Provide education on ulcer and skin care Notes: Electronic Signature(s) Signed: 07/13/2021 5:42:18 PM By: Dellie Catholic RN Entered By: Dellie Catholic on 07/13/2021 17:32:10 -------------------------------------------------------------------------------- Pain Assessment Details Patient Name: Date of Service: Gregory [name] RNO LD 07/13/2021 1:15 PM Medical Record Number: [id number] Patient Account Number: [id number] Date of Birth/Sex: Treating RN: 1920/12/08 (88 y.o. Gregory [name] Primary Care Honora Searson: Wylene Simmer Other Clinician: Referring Maher Shon: Treating Ludy Messamore/Extender: Roxy Cedar in Treatment: 9 Active Problems Location of Pain Severity and Description of Pain Patient Has Paino No Site Locations Pain Management and Medication Current Pain Management: Electronic Signature(s) Signed: 07/13/2021 5:59:26 PM By: Sharyn Creamer RN, BSN Entered By: Sharyn Creamer on 07/13/2021 13:24:20 -------------------------------------------------------------------------------- Patient/Caregiver Education Details Patient Name: Date of Service: Gregory [name] RNO LD 6/1/2023andnbsp1:15 PM Medical Record Number: [id number] Patient Account Number: [id number] Date of Birth/Gender: Treating RN: 01/27/1921 (88 y.o. Gregory [name] Primary Care Physician: Wylene Simmer Other Clinician: Referring Physician: Treating Physician/Extender: Roxy Cedar in Treatment: 9 Education Assessment Education Provided To: Patient Education Topics Provided Wound/Skin Impairment: Methods: Explain/Verbal Responses: Return demonstration correctly Electronic Signature(s) Signed: 07/13/2021 5:42:18 PM By: Dellie Catholic RN Entered By: Dellie Catholic on 07/13/2021  17:32:51 -------------------------------------------------------------------------------- Wound Assessment Details Patient Name: Date of Service: Gregory [name] RNO LD 07/13/2021 1:15 PM Medical Record Number: [id number] Patient Account Number: [id number] Date of Birth/Sex: Treating RN: 1920/04/08 (88 y.o. Gregory [name] Primary Care Zaia Carre: Wylene Simmer Other Clinician: Referring Lakie Mclouth: Treating Jaden Batchelder/Extender: Roxy Cedar in Treatment: 9 Wound Status Wound Number: 1 Primary Etiology: Arterial Insufficiency Ulcer Wound Location: Right T Great oe Wound Status: Open Wounding Event: Pressure  Injury Comorbid History: Arrhythmia, Hypertension Date Acquired: 02/12/2021 Weeks Of Treatment: 9 Clustered Wound: No Photos Wound Measurements Length: (cm) 1.2 Width: (cm) 1.2 Depth: (cm) 0.1 Area: (cm) 1.131 Volume: (cm) 0.113 % Reduction in Area: 76% % Reduction in Volume: 76% Epithelialization: Large (67-100%) Tunneling: No Undermining: No Wound Description Classification: Full Thickness Without Exposed Support Structures Wound Margin: Flat and Intact Exudate Amount: Medium Exudate Type: Serosanguineous Exudate Color: red, brown Foul Odor After Cleansing: No Slough/Fibrino Yes Wound Bed Granulation Amount: None Present (0%) Exposed Structure Necrotic Amount: Large (67-100%) Fascia Exposed: No Necrotic Quality: Eschar Fat Layer (Subcutaneous Tissue) Exposed: Yes Tendon Exposed: No Muscle Exposed: No Joint Exposed: No Bone Exposed: No Treatment Notes Wound #1 (Toe Great) Wound Laterality: Right Cleanser Soap and Water Discharge Instruction: May shower and wash wound with dial antibacterial soap and water prior to dressing change. Wound Cleanser Discharge Instruction: Cleanse the wound with wound cleanser or normal saline prior to applying Gregory [name] clean dressing using gauze sponges, not tissue or cotton balls. Peri-Wound  Care Topical Primary Dressing BETADINE Discharge Instruction: paint right great toe Secondary Dressing Woven Gauze Sponges 2x2 in Discharge Instruction: Apply over primary dressing as directed. Secured With Conforming Stretch Gauze Bandage, Sterile 2x75 (in/in) Discharge Instruction: Secure with stretch gauze as directed. Compression Wrap Compression Stockings Add-Ons Electronic Signature(s) Signed: 07/13/2021 5:59:26 PM By: Sharyn Creamer RN, BSN Entered By: Sharyn Creamer on 07/13/2021 13:38:41 -------------------------------------------------------------------------------- Wound Assessment Details Patient Name: Date of Service: Gregory [name], Gregory [name] RNO LD 07/13/2021 1:15 PM Medical Record Number: [id number] Patient Account Number: [id number] Date of Birth/Sex: Treating RN: 1920/02/15 (88 y.o. Gregory [name] Primary Care Wojciech Willetts: Wylene Simmer Other Clinician: Referring Jayley Hustead: Treating Adriene Padula/Extender: Roxy Cedar in Treatment: 9 Wound Status Wound Number: 2 Primary Etiology: Arterial Insufficiency Ulcer Wound Location: Left T Great oe Wound Status: Open Wounding Event: Pressure Injury Comorbid History: Arrhythmia, Hypertension Date Acquired: 02/12/2021 Weeks Of Treatment: 9 Clustered Wound: No Photos Wound Measurements Length: (cm) 1.4 Width: (cm) 1.7 Depth: (cm) 0.1 Area: (cm) 1.869 Volume: (cm) 0.187 % Reduction in Area: 52.4% % Reduction in Volume: 52.4% Epithelialization: None Tunneling: No Undermining: No Wound Description Classification: Unclassifiable Wound Margin: Flat and Intact Exudate Amount: Small Exudate Type: Serosanguineous Exudate Color: red, brown Foul Odor After Cleansing: No Slough/Fibrino No Wound Bed Granulation Amount: None Present (0%) Exposed Structure Necrotic Amount: Large (67-100%) Fascia Exposed: No Necrotic Quality: Eschar Fat Layer (Subcutaneous Tissue) Exposed: Yes Tendon Exposed:  No Muscle Exposed: No Joint Exposed: No Bone Exposed: No Treatment Notes Wound #2 (Toe Great) Wound Laterality: Left Cleanser Soap and Water Discharge Instruction: May shower and wash wound with dial antibacterial soap and water prior to dressing change. Wound Cleanser Discharge Instruction: Cleanse the wound with wound cleanser or normal saline prior to applying Gregory [name] clean dressing using gauze sponges, not tissue or cotton balls. Peri-Wound Care Topical Primary Dressing Iodosorb Gel 10 (gm) Tube Discharge Instruction: Apply to wound bed as instructed Secondary Dressing Woven Gauze Sponges 2x2 in Discharge Instruction: Apply over primary dressing as directed. Secured With Conforming Stretch Gauze Bandage, Sterile 2x75 (in/in) Discharge Instruction: Secure with stretch gauze as directed. Compression Wrap Compression Stockings Add-Ons Electronic Signature(s) Signed: 07/13/2021 5:59:26 PM By: Sharyn Creamer RN, BSN Entered By: Sharyn Creamer on 07/13/2021 13:39:25 -------------------------------------------------------------------------------- Wound Assessment Details Patient Name: Date of Service: Gregory [name], Gregory [name] RNO LD 07/13/2021 1:15 PM Medical Record Number: [id number] Patient Account Number: [id number] Date of Birth/Sex: Treating RN: May 04, 1920 (88 y.o. Gregory [name] Primary  Care Terren Haberle: Wylene Simmer Other Clinician: Referring Deijah Spikes: Treating Britanie Harshman/Extender: Roxy Cedar in Treatment: 9 Wound Status Wound Number: 4 Primary Etiology: Pressure Ulcer Wound Location: Right, Lateral Foot Wound Status: Open Wounding Event: Gradually Appeared Comorbid History: Arrhythmia, Hypertension Date Acquired: 04/12/2021 Weeks Of Treatment: 5 Clustered Wound: No Photos Wound Measurements Length: (cm) 0.6 Width: (cm) 0.4 Depth: (cm) 0.1 Area: (cm) 0.188 Volume: (cm) 0.019 % Reduction in Area: -300% % Reduction in Volume:  -280% Epithelialization: Small (1-33%) Wound Description Classification: Category/Stage II Wound Margin: Flat and Intact Exudate Amount: Small Exudate Type: Serosanguineous Exudate Color: red, brown Foul Odor After Cleansing: No Slough/Fibrino Yes Wound Bed Granulation Amount: None Present (0%) Exposed Structure Necrotic Amount: Large (67-100%) Fascia Exposed: No Necrotic Quality: Eschar Fat Layer (Subcutaneous Tissue) Exposed: Yes Tendon Exposed: No Muscle Exposed: No Joint Exposed: No Bone Exposed: No Treatment Notes Wound #4 (Foot) Wound Laterality: Right, Lateral Cleanser Soap and Water Discharge Instruction: May shower and wash wound with dial antibacterial soap and water prior to dressing change. Peri-Wound Care Topical Primary Dressing KerraCel Ag Gelling Fiber Dressing, 2x2 in (silver alginate) Discharge Instruction: Apply silver alginate to wound bed as instructed Secondary Dressing ALLEVYN Gentle Border, 3x3 (in/in) Discharge Instruction: Apply over primary dressing as directed. Secured With Compression Wrap Compression Stockings Environmental education officer) Signed: 07/13/2021 5:59:26 PM By: Sharyn Creamer RN, BSN Entered By: Sharyn Creamer on 07/13/2021 13:40:24 -------------------------------------------------------------------------------- Wound Assessment Details Patient Name: Date of Service: Gregory [name], Gregory [name] RNO LD 07/13/2021 1:15 PM Medical Record Number: [id number] Patient Account Number: [id number] Date of Birth/Sex: Treating RN: 06-30-1920 (88 y.o. Gregory [name] Primary Care Dianelly Ferran: Wylene Simmer Other Clinician: Referring Kellar Westberg: Treating Liev Brockbank/Extender: Roxy Cedar in Treatment: 9 Wound Status Wound Number: 5 Primary Etiology: Pressure Ulcer Wound Location: Right, Medial T Great oe Wound Status: Open Wounding Event: Blister Comorbid History: Arrhythmia, Hypertension Date Acquired: 06/12/2021 Weeks  Of Treatment: 2 Clustered Wound: No Photos Wound Measurements Length: (cm) 2 Width: (cm) 2.4 Depth: (cm) 0.1 Area: (cm) 3.77 Volume: (cm) 0.377 % Reduction in Area: -5209.9% % Reduction in Volume: -5285.7% Epithelialization: None Tunneling: No Undermining: No Wound Description Classification: Category/Stage II Exudate Amount: Medium Exudate Type: Serosanguineous Exudate Color: red, brown Foul Odor After Cleansing: No Slough/Fibrino Yes Wound Bed Granulation Amount: Small (1-33%) Exposed Structure Granulation Quality: Red Fascia Exposed: No Necrotic Amount: Large (67-100%) Fat Layer (Subcutaneous Tissue) Exposed: Yes Necrotic Quality: Eschar, Adherent Slough Tendon Exposed: No Muscle Exposed: No Joint Exposed: No Bone Exposed: No Treatment Notes Wound #5 (Toe Great) Wound Laterality: Right, Medial Cleanser Soap and Water Discharge Instruction: May shower and wash wound with dial antibacterial soap and water prior to dressing change. Peri-Wound Care Topical Primary Dressing BETADINE Discharge Instruction: Paint right great toe Secondary Dressing ALLEVYN Gentle Border, 3x3 (in/in) Discharge Instruction: Apply over primary dressing as directed. Secured With Compression Wrap Compression Stockings Environmental education officer) Signed: 07/13/2021 5:59:26 PM By: Sharyn Creamer RN, BSN Entered By: Sharyn Creamer on 07/13/2021 13:40:53 -------------------------------------------------------------------------------- Wound Assessment Details Patient Name: Date of Service: Gregory [name], Gregory [name] RNO LD 07/13/2021 1:15 PM Medical Record Number: [id number] Patient Account Number: [id number] Date of Birth/Sex: Treating RN: 1921-01-26 (88 y.o. Gregory [name] Primary Care Clanton Emanuelson: Wylene Simmer Other Clinician: Referring Bianey Tesoro: Treating Brelan Hannen/Extender: Roxy Cedar in Treatment: 9 Wound Status Wound Number: 6 Primary Etiology: Pressure  Ulcer Wound Location: Right Calcaneus Wound Status: Healed - Epithelialized Wounding Event: Pressure Injury Comorbid History: Arrhythmia, Hypertension Date Acquired: 06/24/2021  Weeks Of Treatment: 1 Clustered Wound: No Photos Wound Measurements Length: (cm) Width: (cm) Depth: (cm) Area: (cm) Volume: (cm) 0 % Reduction in Area: 100% 0 % Reduction in Volume: 100% 0 Epithelialization: Large (67-100%) 0 Tunneling: No 0 Undermining: No Wound Description Classification: Category/Stage II Exudate Amount: None Present Foul Odor After Cleansing: No Slough/Fibrino No Wound Bed Granulation Amount: None Present (0%) Exposed Structure Necrotic Amount: None Present (0%) Fascia Exposed: No Fat Layer (Subcutaneous Tissue) Exposed: No Tendon Exposed: No Muscle Exposed: No Joint Exposed: No Bone Exposed: No Electronic Signature(s) Signed: 07/13/2021 5:42:18 PM By: Dellie Catholic RN Signed: 07/13/2021 5:59:26 PM By: Sharyn Creamer RN, BSN Entered By: Dellie Catholic on 07/13/2021 14:15:02 -------------------------------------------------------------------------------- Vitals Details Patient Name: Date of Service: Gregory [name], Gregory [name] RNO LD 07/13/2021 1:15 PM Medical Record Number: [id number] Patient Account Number: [id number] Date of Birth/Sex: Treating RN: [birth year] (88 y.o. Gregory [name] Primary Care Mable Dara: Wylene Simmer Other Clinician: Referring Yanique Mulvihill: Treating Harli Engelken/Extender: Roxy Cedar in Treatment: 9 Vital Signs Time Taken: 13:22 Temperature (F): 97.7 Pulse (bpm): 78 Respiratory Rate (breaths/min): 18 Blood Pressure (mmHg): 125/66 Reference Range: 80 - 120 mg / dl Electronic Signature(s) Signed: 07/13/2021 5:59:26 PM By: Sharyn Creamer RN, BSN Entered By: Sharyn Creamer on 07/13/2021 13:24:12

## 2021-07-13 NOTE — Progress Notes (Signed)
ILAI, HILLER ([id number]) Visit Report for 07/13/2021 Chief Complaint Document Details Patient Name: Date of Service: Gregory [name] RNO LD 07/13/2021 1:15 PM Medical Record Number: [id number] Patient Account Number: [id number] Date of Birth/Sex: Treating RN: 02-05-1921 (88 y.o. Gregory [name] Primary Care Provider: Wylene Simmer Other Clinician: Referring Provider: Treating Provider/Extender: Roxy Cedar in Treatment: 9 Information Obtained from: Patient Chief Complaint Patient is at the clinic for treatment of an open pressure ulcer on his left heel and dry gangrene of both great toes Electronic Signature(s) Signed: 07/13/2021 2:51:36 PM By: Fredirick Maudlin MD FACS Entered By: Fredirick Maudlin on 07/13/2021 14:51:36 -------------------------------------------------------------------------------- Debridement Details Patient Name: Date of Service: Gregory [name], Gregory [name] RNO LD 07/13/2021 1:15 PM Medical Record Number: [id number] Patient Account Number: [id number] Date of Birth/Sex: Treating RN: 1920/04/11 (88 y.o. Gregory [name] Primary Care Provider: Wylene Simmer Other Clinician: Referring Provider: Treating Provider/Extender: Roxy Cedar in Treatment: 9 Debridement Performed for Assessment: Wound #1 Right T Great oe Performed By: Physician Fredirick Maudlin, MD Debridement Type: Debridement Severity of Tissue Pre Debridement: Fat layer exposed Level of Consciousness (Pre-procedure): Awake and Alert Pre-procedure Verification/Time Out Yes - 14:09 Taken: Start Time: 14:09 Pain Control: Other : Benzocaine 20% T Area Debrided (L x W): otal 1.2 (cm) x 1.2 (cm) = 1.44 (cm) Tissue and other material debrided: Non-Viable, Eschar, Subcutaneous Level: Skin/Subcutaneous Tissue Debridement Description: Excisional Instrument: Curette Bleeding: Minimum Hemostasis Achieved: Pressure End Time: 14:11 Procedural Pain: 0 Post  Procedural Pain: 0 Response to Treatment: Procedure was tolerated well Level of Consciousness (Post- Awake and Alert procedure): Post Debridement Measurements of Total Wound Length: (cm) 1.2 Width: (cm) 1.2 Depth: (cm) 0.1 Volume: (cm) 0.113 Character of Wound/Ulcer Post Debridement: Improved Severity of Tissue Post Debridement: Fat layer exposed Post Procedure Diagnosis Same as Pre-procedure Electronic Signature(s) Signed: 07/13/2021 4:49:27 PM By: Fredirick Maudlin MD FACS Signed: 07/13/2021 5:42:18 PM By: Dellie Catholic RN Entered By: Dellie Catholic on 07/13/2021 14:12:58 -------------------------------------------------------------------------------- Debridement Details Patient Name: Date of Service: Gregory [name], Gregory [name] RNO LD 07/13/2021 1:15 PM Medical Record Number: [id number] Patient Account Number: [id number] Date of Birth/Sex: Treating RN: [birth year] (88 y.o. Gregory [name] Primary Care Provider: Wylene Simmer Other Clinician: Referring Provider: Treating Provider/Extender: Roxy Cedar in Treatment: 9 Debridement Performed for Assessment: Wound #2 Left T Great oe Performed By: Physician Fredirick Maudlin, MD Debridement Type: Debridement Severity of Tissue Pre Debridement: Fat layer exposed Level of Consciousness (Pre-procedure): Awake and Alert Pre-procedure Verification/Time Out Yes - 14:09 Taken: Start Time: 14:09 Pain Control: Other : Benzocaine 20% T Area Debrided (L x W): otal 1.4 (cm) x 1.7 (cm) = 2.38 (cm) Tissue and other material debrided: Non-Viable, Eschar, Subcutaneous Level: Skin/Subcutaneous Tissue Debridement Description: Excisional Instrument: Curette Bleeding: Minimum Hemostasis Achieved: Pressure End Time: 14:11 Procedural Pain: 0 Post Procedural Pain: 0 Response to Treatment: Procedure was tolerated well Level of Consciousness (Post- Awake and Alert procedure): Post Debridement Measurements of Total  Wound Length: (cm) 1.4 Width: (cm) 1.7 Depth: (cm) 0.1 Volume: (cm) 0.187 Character of Wound/Ulcer Post Debridement: Improved Severity of Tissue Post Debridement: Fat layer exposed Post Procedure Diagnosis Same as Pre-procedure Electronic Signature(s) Signed: 07/13/2021 4:49:27 PM By: Fredirick Maudlin MD FACS Signed: 07/13/2021 5:42:18 PM By: Dellie Catholic RN Entered By: Dellie Catholic on 07/13/2021 14:13:14 -------------------------------------------------------------------------------- Debridement Details Patient Name: Date of Service: Gregory [name], Gregory [name] RNO LD 07/13/2021 1:15 PM Medical Record Number: [id number] Patient Account Number: [id number] Date of Birth/Sex: Treating RN: Apr 18, 1920 (88  y.o. Gregory [name] Primary Care Provider: Wylene Simmer Other Clinician: Referring Provider: Treating Provider/Extender: Roxy Cedar in Treatment: 9 Debridement Performed for Assessment: Wound #5 Right,Medial T Great oe Performed By: Physician Fredirick Maudlin, MD Debridement Type: Debridement Level of Consciousness (Pre-procedure): Awake and Alert Pre-procedure Verification/Time Out Yes - 14:09 Taken: Start Time: 14:09 Pain Control: Other : Benzocaine 20% T Area Debrided (L x W): otal 2 (cm) x 2.4 (cm) = 4.8 (cm) Tissue and other material debrided: Non-Viable, Eschar, Subcutaneous Level: Skin/Subcutaneous Tissue Debridement Description: Excisional Instrument: Curette Bleeding: Minimum Hemostasis Achieved: Pressure End Time: 14:11 Procedural Pain: 0 Post Procedural Pain: 0 Response to Treatment: Procedure was tolerated well Level of Consciousness (Post- Awake and Alert procedure): Post Debridement Measurements of Total Wound Length: (cm) 2 Stage: Category/Stage II Width: (cm) 2.4 Depth: (cm) 0.1 Volume: (cm) 0.377 Character of Wound/Ulcer Post Debridement: Improved Post Procedure Diagnosis Same as Pre-procedure Electronic  Signature(s) Signed: 07/13/2021 4:49:27 PM By: Fredirick Maudlin MD FACS Signed: 07/13/2021 5:42:18 PM By: Dellie Catholic RN Entered By: Dellie Catholic on 07/13/2021 14:14:09 -------------------------------------------------------------------------------- Debridement Details Patient Name: Date of Service: Gregory [name], Gregory [name] RNO LD 07/13/2021 1:15 PM Medical Record Number: [id number] Patient Account Number: [id number] Date of Birth/Sex: Treating RN: [birth year] (88 y.o. Gregory [name] Primary Care Provider: Wylene Simmer Other Clinician: Referring Provider: Treating Provider/Extender: Roxy Cedar in Treatment: 9 Debridement Performed for Assessment: Wound #4 Right,Lateral Foot Performed By: Physician Fredirick Maudlin, MD Debridement Type: Debridement Level of Consciousness (Pre-procedure): Awake and Alert Pre-procedure Verification/Time Out Yes - 14:09 Taken: Start Time: 14:09 Pain Control: Other : Benzocaine 20% T Area Debrided (L x W): otal 0.6 (cm) x 0.4 (cm) = 0.24 (cm) Tissue and other material debrided: Non-Viable, Slough, Subcutaneous, Slough Level: Skin/Subcutaneous Tissue Debridement Description: Excisional Instrument: Curette Bleeding: Minimum Hemostasis Achieved: Pressure End Time: 14:11 Procedural Pain: 0 Post Procedural Pain: 0 Response to Treatment: Procedure was tolerated well Level of Consciousness (Post- Awake and Alert procedure): Post Debridement Measurements of Total Wound Length: (cm) 0.6 Stage: Category/Stage II Width: (cm) 0.4 Depth: (cm) 0.1 Volume: (cm) 0.019 Character of Wound/Ulcer Post Debridement: Improved Post Procedure Diagnosis Same as Pre-procedure Electronic Signature(s) Signed: 07/13/2021 4:49:27 PM By: Fredirick Maudlin MD FACS Signed: 07/13/2021 5:42:18 PM By: Dellie Catholic RN Entered By: Dellie Catholic on 07/13/2021  14:18:01 -------------------------------------------------------------------------------- HPI Details Patient Name: Date of Service: Gregory [name], Gregory [name] RNO LD 07/13/2021 1:15 PM Medical Record Number: [id number] Patient Account Number: [id number] Date of Birth/Sex: Treating RN: Dec 31, 1920 (88 y.o. Gregory [name] Primary Care Provider: Wylene Simmer Other Clinician: Referring Provider: Treating Provider/Extender: Roxy Cedar in Treatment: 9 History of Present Illness HPI Description: ADMISSION 05/05/2021 This is an extremely frail 88 year old man who presents to clinic today accompanied by his son for evaluation of Gregory [name] pressure ulcer on his left heel. He also has dry gangrene on the tips of his bilateral great toes and the lateral aspect of both feet. It sounds like after Gregory [name] hospitalization for Gregory [name] GI bleed, he had Gregory [name] blanket on his feet which ultimately resulted in Gregory [name] pressure injury. He has severe dysphagia and is dependent on G-tube feeding. The wound on his heel has been present since January. He has 24-hour care and they have been applying Xeroform to the heel. He apparently has some sort of air boot that he is meant to wear in bed at night. He has severe peripheral arterial disease as evidenced by the ABIs obtained by Dr. Rachelle Hora  and copied here: ABI Findings: +---------+------------------+-----+----------+--------+ Right Rt Pressure (mmHg)IndexWaveform Comment  +---------+------------------+-----+----------+--------+ Brachial 136     +---------+------------------+-----+----------+--------+ PTA >255 [place] monophasic  +---------+------------------+-----+----------+--------+ DP >255 [place] monophasic  +---------+------------------+-----+----------+--------+ Great T oe24 0.18    +---------+------------------+-----+----------+--------+ +---------+------------------+-----+----------+-------+ Left Lt Pressure (mmHg)IndexWaveform  Comment +---------+------------------+-----+----------+-------+ Brachial 136     +---------+------------------+-----+----------+-------+ PTA >255 [place] monophasic  +---------+------------------+-----+----------+-------+ DP >255 [place] monophasic  +---------+------------------+-----+----------+-------+ Great T oe0 0.00 Absent   +---------+------------------+-----+----------+-------+ +-------+-----------+-----------+------------+------------+ ABI/TBIT oday's ABIT oday's TBIPrevious ABIPrevious TBI +-------+-----------+-----------+------------+------------+ Right [place] 0.18    +-------+-----------+-----------+------------+------------+ Left [place] 0.00    +-------+-----------+-----------+------------+------------+ Summary: Right: Resting right ankle-brachial index indicates noncompressible right lower extremity arteries. The right toe-brachial index is abnormal. Left: Resting left ankle-brachial index indicates noncompressible left lower extremity arteries. Absent left great toe PPG waveform. He saw Dr. Rachelle Hora on March 16 and at that time, he was felt to be far too frail to undergo any sort of intervention including an aortogram. Certainly not Gregory [name] surgical candidate. According to the electronic medical record, Dr. Rachelle Hora is planning to see him back in Gregory [name] couple of weeks and Gregory determine if he has made any significant improvement in his overall status and might be suitable for an intervention. I read his note to suggest that the patient has had multiple complications from other procedures and that even an aortogram may be too risky. 05/12/2021: Rather remarkably, the eschar and dry gangrene on his bilateral great toes and bilateral fifth metatarsals actually looks better. Some of the eschar on his toes is peeling away to reveal healing tissue underneath. This is also the case with his lateral foot bilaterally. The heel is clean and superficial, with minimal  slough. 06/07/2021: Since our last visit, Mr. Flink has been in the hospital quite Gregory [name] bit. While in the hospital, they were painting his lateral feet and great toes with Betadine. The wound on his heel is nearly closed with just Gregory [name] sliver of open skin. They have been floating his heels as directed and his home health nurse recently ordered Gregory [name] device to keep his blankets from rubbing on his toes. They have continued using the Betadine on his lateral feet and great toes and the Prisma silver collagen on his left heel. He does have Gregory [name] bit of callus buildup on his right Achilles tendon area suggesting there is some pressure being applied. 06/15/2021: The left heel wound is closed. There is some slough buildup in the right lateral foot wound. Both great toe wounds have Gregory [name] layer of thick dark eschar, but they appear to have some viable tissue underneath this. He is complaining of pain in his right heel; he had Gregory [name] wound there at one time. On inspection, there is no open skin at this site. 06/23/2021: The left heel remains closed. The right heel never has opened. Both great toe wounds continue to have Gregory [name] layer of thick dark eschar, but it is softer today. The right lateral foot wound also has eschar overlying the surface which has some accumulated slough. He also has areas on his great toes bilaterally that look like they have been rubbing on the slippers that he wears all day. 07/06/2021: The right heel has Gregory [name] new callus on it. It peeled off as I was examining the site and there is Gregory [name] small open wound in that area. The right great toe looks worse today; there are more darkened areas of eschar and the toe itself feels Gregory [name] bit boggy. The right lateral foot is small with just Gregory [name] small amount of slough.  The left great toe is in reasonable condition with just Gregory [name] layer of fairly soft eschar at the tip. According to the patient's family, he has been insisting upon blankets being wrapped over his feet at night and he does not  cooperate with heel flotation. He also required Gregory [name] 2 unit blood transfusion last week for fairly significant anemia. 07/13/2021: Both heels are closed. The right lateral foot wound is small, but about the same size as last week. There is some accumulated slough within the wound. The right great toe looks better today; it is no longer boggy and the Betadine paint has dried up the areas that were concerning to me. The left great toe looks to be responding nicely to the Iodosorb dressing. Electronic Signature(s) Signed: 07/13/2021 2:53:30 PM By: Fredirick Maudlin MD FACS Entered By: Fredirick Maudlin on 07/13/2021 14:53:30 -------------------------------------------------------------------------------- Physical Exam Details Patient Name: Date of Service: Gregory [name], Gregory [name] RNO LD 07/13/2021 1:15 PM Medical Record Number: [id number] Patient Account Number: [id number] Date of Birth/Sex: Treating RN: 10-17-1920 (88 y.o. Gregory [name] Primary Care Provider: Wylene Simmer Other Clinician: Referring Provider: Treating Provider/Extender: Roxy Cedar in Treatment: 9 Constitutional . . . . No acute distress. Respiratory Normal work of breathing on room air. Notes 07/13/2021: Both heels are closed. The right lateral foot wound is small, but about the same size as last week. There is some accumulated slough within the wound. The right great toe looks better today; it is no longer boggy and the Betadine paint has dried up the areas that were concerning to me. The left great toe looks to be responding nicely to the Iodosorb dressing. Electronic Signature(s) Signed: 07/13/2021 3:06:56 PM By: Fredirick Maudlin MD FACS Previous Signature: 07/13/2021 2:53:58 PM Version By: Fredirick Maudlin MD FACS Entered By: Fredirick Maudlin on 07/13/2021 15:06:56 -------------------------------------------------------------------------------- Physician Orders Details Patient Name: Date of Service: Gregory [name], Gregory [name] RNO LD 07/13/2021 1:15 PM Medical Record Number: [id number] Patient Account Number: [id number] Date of Birth/Sex: Treating RN: 01-02-1921 (88 y.o. 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 - Dr. Celine Ahr Room 3 Friday June 9th at 12:30 pm Return Gregory [name] Bathing/ Shower/ Hygiene May shower and wash wound with soap and water. Edema Control - Lymphedema / SCD / Other Elevate legs to the level of the heart or above for 30 minutes daily and/or when sitting, Gregory [name] frequency of: - throughout the day Avoid standing for long periods of time. Non Wound Condition Bilateral Lower Extremities Protect area with: - Right and Left heels protect with [place] No change in wound care orders this week; continue Home Health for wound care. May utilize formulary equivalent dressing for wound treatment orders unless otherwise specified. Other Home Health Orders/Instructions: - [facility] Wound Treatment Wound #1 - T Great oe Wound Laterality: Right Cleanser: Soap and Water 1 x Per Day/30 Days Discharge Instructions: May shower and wash wound with dial antibacterial soap and  water prior to dressing change. Cleanser: Wound Cleanser 1 x Per Day/30 Days Discharge  Instructions: Cleanse the wound with wound cleanser or normal saline prior to applying Gregory [name] clean dressing using gauze sponges, not tissue or cotton balls. Prim Dressing: BETADINE (Home Health) (Generic) 1 x Per Day/30 Days ary Discharge Instructions: paint right great toe Secondary Dressing: Woven Gauze Sponges 2x2 in ([place]) 1 x Per Day/30 Days Discharge Instructions: Apply over primary dressing as directed. Secured With: Child psychotherapist, Sterile 2x75 (in/in) (Home Health) 1 x Per Day/30 Days Discharge Instructions: Secure with stretch gauze as directed. Wound #2 - T Great oe Wound Laterality: Left Cleanser: Soap and Water 1 x Per Day/30 Days Discharge Instructions: May shower and wash wound with dial antibacterial soap and water prior to dressing change. Cleanser: Wound Cleanser 1 x Per Day/30 Days Discharge Instructions: Cleanse the wound with wound cleanser or normal saline prior to applying Gregory [name] clean dressing using gauze sponges, not tissue or cotton balls. Prim Dressing: Iodosorb Gel 10 (gm) Tube (Home Health) (Generic) 1 x Per Day/30 Days ary Discharge Instructions: Apply to wound bed as instructed Secondary Dressing: Woven Gauze Sponges 2x2 in ([place]) 1 x Per Day/30 Days Discharge Instructions: Apply over primary dressing as directed. Secured With: Child psychotherapist, Sterile 2x75 (in/in) (Home Health) 1 x Per Day/30 Days Discharge Instructions: Secure with stretch gauze as directed. Wound #4 - Foot Wound Laterality: Right, Lateral Cleanser: Soap and Water (Home Health) 1 x Per Day/30 Days Discharge Instructions: May shower and wash wound with dial antibacterial soap and water prior to dressing change. Prim Dressing: KerraCel Ag Gelling Fiber Dressing, 2x2 in (silver alginate) 1 x Per Day/30 Days ary Discharge Instructions: Apply silver alginate to wound bed as instructed Prim Dressing: 1 x Per Day/30 Days ary Secondary Dressing: ALLEVYN  Gentle Border, 3x3 (in/in) 1 x Per Day/30 Days Discharge Instructions: Apply over primary dressing as directed. Wound #5 - T Great oe Wound Laterality: Right, Medial Cleanser: Soap and Water (Home Health) 1 x Per Day/30 Days Discharge Instructions: May shower and wash wound with dial antibacterial soap and water prior to dressing change. Prim Dressing: BETADINE 1 x Per Day/30 Days ary Discharge Instructions: Paint right great toe Secondary Dressing: ALLEVYN Gentle Border, 3x3 (in/in) 1 x Per Day/30 Days Discharge Instructions: Apply over primary dressing as directed. Electronic Signature(s) Signed: 07/13/2021 4:49:27 PM By: Fredirick Maudlin MD FACS Entered By: Fredirick Maudlin on 07/13/2021 14:55:43 -------------------------------------------------------------------------------- Problem List Details Patient Name: Date of Service: Gregory [name], Gregory [name] RNO LD 07/13/2021 1:15 PM Medical Record Number: [id number] Patient Account Number: [id number] Date of Birth/Sex: Treating RN: 1920/08/15 (88 y.o. Gregory [name] Primary Care Provider: Wylene Simmer Other Clinician: Referring Provider: Treating Provider/Extender: Roxy Cedar in Treatment: 9 Active Problems ICD-10 Encounter Code Description Active Date MDM Diagnosis I70.263 Atherosclerosis of native arteries of extremities with gangrene, bilateral legs 05/05/2021 No Yes I73.9 Peripheral vascular disease, unspecified 05/05/2021 No Yes E43 Unspecified severe protein-calorie malnutrition 05/05/2021 No Yes N18.31 Chronic kidney disease, stage 3a 05/05/2021 No Yes R13.10 Dysphagia, unspecified 05/05/2021 No Yes L97.512 Non-pressure chronic ulcer of other part of right foot with fat layer exposed 06/07/2021 No Yes L97.522 Non-pressure chronic ulcer of other part of left foot with fat layer exposed 06/07/2021 No Yes L89.612 Pressure ulcer of right heel, stage 2 07/06/2021 No Yes Inactive Problems ICD-10 Code Description  Active Date Inactive Date L89.622 Pressure ulcer of left heel, stage 2 05/05/2021 05/05/2021 Resolved  Problems Electronic Signature(s) Signed: 07/13/2021 2:49:21 PM By: Fredirick Maudlin MD FACS Entered By: Fredirick Maudlin on 07/13/2021 14:49:20 -------------------------------------------------------------------------------- Progress Note Details Patient Name: Date of Service: Gregory [name], Gregory [name] RNO LD 07/13/2021 1:15 PM Medical Record Number: [id number] Patient Account Number: [id number] Date of Birth/Sex: Treating RN: 11/13/1920 (88 y.o. Gregory [name] Primary Care Provider: Wylene Simmer Other Clinician: Referring Provider: Treating Provider/Extender: Roxy Cedar in Treatment: 9 Subjective Chief Complaint Information obtained from Patient Patient is at the clinic for treatment of an open pressure ulcer on his left heel and dry gangrene of both great toes History of Present Illness (HPI) ADMISSION 05/05/2021 This is an extremely frail 88 year old man who presents to clinic today accompanied by his son for evaluation of Gregory [name] pressure ulcer on his left heel. He also has dry gangrene on the tips of his bilateral great toes and the lateral aspect of both feet. It sounds like after Gregory [name] hospitalization for Gregory [name] GI bleed, he had Gregory [name] blanket on his feet which ultimately resulted in Gregory [name] pressure injury. He has severe dysphagia and is dependent on G-tube feeding. The wound on his heel has been present since January. He has 24-hour care and they have been applying Xeroform to the heel. He apparently has some sort of air boot that he is meant to wear in bed at night. He has severe peripheral arterial disease as evidenced by the ABIs obtained by Dr. Rachelle Hora and copied here: ABI Findings: +---------+------------------+-----+----------+--------+ Right Rt Pressure (mmHg)IndexWaveform Comment  +---------+------------------+-----+----------+--------+ Brachial 136     +---------+------------------+-----+----------+--------+ PTA >255 [place] monophasic  +---------+------------------+-----+----------+--------+ DP >255 [place] monophasic  +---------+------------------+-----+----------+--------+ Great T oe24 0.18    +---------+------------------+-----+----------+--------+ +---------+------------------+-----+----------+-------+ Left Lt Pressure (mmHg)IndexWaveform Comment +---------+------------------+-----+----------+-------+ Brachial 136    +---------+------------------+-----+----------+-------+ PTA >255 [place] monophasic  +---------+------------------+-----+----------+-------+ DP >255 [place] monophasic  +---------+------------------+-----+----------+-------+ Great T oe0 0.00 Absent   +---------+------------------+-----+----------+-------+ +-------+-----------+-----------+------------+------------+ ABI/TBIT oday's ABIT oday's TBIPrevious ABIPrevious TBI +-------+-----------+-----------+------------+------------+ Right [place] 0.18    +-------+-----------+-----------+------------+------------+ Left [place] 0.00    +-------+-----------+-----------+------------+------------+ Summary: Right: Resting right ankle-brachial index indicates noncompressible right lower extremity arteries. The right toe-brachial index is abnormal. Left: Resting left ankle-brachial index indicates noncompressible left lower extremity arteries. Absent left great toe PPG waveform. He saw Dr. Rachelle Hora on March 16 and at that time, he was felt to be far too frail to undergo any sort of intervention including an aortogram. Certainly not Gregory [name] surgical candidate. According to the electronic medical record, Dr. Rachelle Hora is planning to see him back in Gregory [name] couple of weeks and Gregory determine if he has made any significant improvement in his overall status and might be suitable for an intervention. I read his note to suggest that the patient has had  multiple complications from other procedures and that even an aortogram may be too risky. 05/12/2021: Rather remarkably, the eschar and dry gangrene on his bilateral great toes and bilateral fifth metatarsals actually looks better. Some of the eschar on his toes is peeling away to reveal healing tissue underneath. This is also the case with his lateral foot bilaterally. The heel is clean and superficial, with minimal slough. 06/07/2021: Since our last visit, Mr. Crookston has been in the hospital quite Gregory [name] bit. While in the hospital, they were painting his lateral feet and great toes with Betadine. The wound on his heel is nearly closed with just Gregory [name] sliver of open skin. They have been floating his heels as directed and his home health nurse recently ordered Gregory [name] device to keep his blankets from rubbing on  his toes. They have continued using the Betadine on his lateral feet and great toes and the Prisma silver collagen on his left heel. He does have Gregory [name] bit of callus buildup on his right Achilles tendon area suggesting there is some pressure being applied. 06/15/2021: The left heel wound is closed. There is some slough buildup in the right lateral foot wound. Both great toe wounds have Gregory [name] layer of thick dark eschar, but they appear to have some viable tissue underneath this. He is complaining of pain in his right heel; he had Gregory [name] wound there at one time. On inspection, there is no open skin at this site. 06/23/2021: The left heel remains closed. The right heel never has opened. Both great toe wounds continue to have Gregory [name] layer of thick dark eschar, but it is softer today. The right lateral foot wound also has eschar overlying the surface which has some accumulated slough. He also has areas on his great toes bilaterally that look like they have been rubbing on the slippers that he wears all day. 07/06/2021: The right heel has Gregory [name] new callus on it. It peeled off as I was examining the site and there is Gregory [name] small open wound in  that area. The right great toe looks worse today; there are more darkened areas of eschar and the toe itself feels Gregory [name] bit boggy. The right lateral foot is small with just Gregory [name] small amount of slough. The left great toe is in reasonable condition with just Gregory [name] layer of fairly soft eschar at the tip. According to the patient's family, he has been insisting upon blankets being wrapped over his feet at night and he does not cooperate with heel flotation. He also required Gregory [name] 2 unit blood transfusion last week for fairly significant anemia. 07/13/2021: Both heels are closed. The right lateral foot wound is small, but about the same size as last week. There is some accumulated slough within the wound. The right great toe looks better today; it is no longer boggy and the Betadine paint has dried up the areas that were concerning to me. The left great toe looks to be responding nicely to the Iodosorb dressing. Patient History Information obtained from Patient, Caregiver. Family History Unknown History. Social History Never smoker, Marital Status - Widowed, Alcohol Use - Never, Drug Use - No History, Caffeine Use - Never. Medical History Cardiovascular Patient has history of Arrhythmia - Gregory [name]-Fib, Hypertension Medical Gregory [name] Surgical History Notes nd Cardiovascular Pacemaker, Complete heart block Gastrointestinal Dysphagia, PEG tube in place Endocrine Hypothyroidism Genitourinary CKD-3 Neurologic Neuromuscular disorder Objective Constitutional No acute distress. Vitals Time Taken: 1:22 PM, Temperature: 97.7 F, Pulse: 78 bpm, Respiratory Rate: 18 breaths/min, Blood Pressure: 125/66 mmHg. Respiratory Normal work of breathing on room air. General Notes: 07/13/2021: Both heels are closed. The right lateral foot wound is small, but about the same size as last week. There is some accumulated slough within the wound. The right great toe looks better today; it is no longer boggy and the Betadine paint has dried up  the areas that were concerning to me. The left great toe looks to be responding nicely to the Iodosorb dressing. Integumentary (Hair, Skin) Wound #1 status is Open. Original cause of wound was Pressure Injury. The date acquired was: 02/12/2021. The wound has been in treatment 9 weeks. The wound is located on the Right T Great. The wound measures 1.2cm length x 1.2cm width x 0.1cm depth; 1.131cm^2 area and 0.113cm^3 volume. There is Fat oe Layer (Subcutaneous  Tissue) exposed. There is no tunneling or undermining noted. There is Gregory [name] medium amount of serosanguineous drainage noted. The wound margin is flat and intact. There is no granulation within the wound bed. There is Gregory [name] large (67-100%) amount of necrotic tissue within the wound bed including Eschar. Wound #2 status is Open. Original cause of wound was Pressure Injury. The date acquired was: 02/12/2021. The wound has been in treatment 9 weeks. The wound is located on the Left T Great. The wound measures 1.4cm length x 1.7cm width x 0.1cm depth; 1.869cm^2 area and 0.187cm^3 volume. There is Fat oe Layer (Subcutaneous Tissue) exposed. There is no tunneling or undermining noted. There is Gregory [name] small amount of serosanguineous drainage noted. The wound margin is flat and intact. There is no granulation within the wound bed. There is Gregory [name] large (67-100%) amount of necrotic tissue within the wound bed including Eschar. Wound #4 status is Open. Original cause of wound was Gradually Appeared. The date acquired was: 04/12/2021. The wound has been in treatment 5 weeks. The wound is located on the Right,Lateral Foot. The wound measures 0.6cm length x 0.4cm width x 0.1cm depth; 0.188cm^2 area and 0.019cm^3 volume. There is Fat Layer (Subcutaneous Tissue) exposed. There is Gregory [name] small amount of serosanguineous drainage noted. The wound margin is flat and intact. There is no granulation within the wound bed. There is Gregory [name] large (67-100%) amount of necrotic tissue within the wound bed  including Eschar. Wound #5 status is Open. Original cause of wound was Blister. The date acquired was: 06/12/2021. The wound has been in treatment 2 weeks. The wound is located on the Right,Medial T Great. The wound measures 2cm length x 2.4cm width x 0.1cm depth; 3.77cm^2 area and 0.377cm^3 volume. There is Fat oe Layer (Subcutaneous Tissue) exposed. There is no tunneling or undermining noted. There is Gregory [name] medium amount of serosanguineous drainage noted. There is small (1-33%) red granulation within the wound bed. There is Gregory [name] large (67-100%) amount of necrotic tissue within the wound bed including Eschar and Adherent Slough. Wound #6 status is Healed - Epithelialized. Original cause of wound was Pressure Injury. The date acquired was: 06/24/2021. The wound has been in treatment 1 weeks. The wound is located on the Right Calcaneus. The wound measures 0cm length x 0cm width x 0cm depth; 0cm^2 area and 0cm^3 volume. There is no tunneling or undermining noted. There is Gregory [name] none present amount of drainage noted. There is no granulation within the wound bed. There is no necrotic tissue within the wound bed. Assessment Active Problems ICD-10 Atherosclerosis of native arteries of extremities with gangrene, bilateral legs Peripheral vascular disease, unspecified Unspecified severe protein-calorie malnutrition Chronic kidney disease, stage 3a Dysphagia, unspecified Non-pressure chronic ulcer of other part of right foot with fat layer exposed Non-pressure chronic ulcer of other part of left foot with fat layer exposed Pressure ulcer of right heel, stage 2 Procedures Wound #1 Pre-procedure diagnosis of Wound #1 is an Arterial Insufficiency Ulcer located on the Right T Great .Severity of Tissue Pre Debridement is: Fat layer oe exposed. There was Gregory [name] Excisional Skin/Subcutaneous Tissue Debridement with Gregory [name] total area of 1.44 sq cm performed by Fredirick Maudlin, MD. With the following instrument(s): Curette to  remove Non-Viable tissue/material. Material removed includes Eschar and Subcutaneous Tissue and after achieving pain control using Other (Benzocaine 20%). No specimens were taken. Gregory [name] time out was conducted at 14:09, prior to the start of the procedure. Gregory [name] Minimum amount of bleeding was controlled with Pressure. The procedure was  tolerated well with Gregory [name] pain level of 0 throughout and Gregory [name] pain level of 0 following the procedure. Post Debridement Measurements: 1.2cm length x 1.2cm width x 0.1cm depth; 0.113cm^3 volume. Character of Wound/Ulcer Post Debridement is improved. Severity of Tissue Post Debridement is: Fat layer exposed. Post procedure Diagnosis Wound #1: Same as Pre-Procedure Wound #2 Pre-procedure diagnosis of Wound #2 is an Arterial Insufficiency Ulcer located on the Left T Great .Severity of Tissue Pre Debridement is: Fat layer oe exposed. There was Gregory [name] Excisional Skin/Subcutaneous Tissue Debridement with Gregory [name] total area of 2.38 sq cm performed by Fredirick Maudlin, MD. With the following instrument(s): Curette to remove Non-Viable tissue/material. Material removed includes Eschar and Subcutaneous Tissue and after achieving pain control using Other (Benzocaine 20%). No specimens were taken. Gregory [name] time out was conducted at 14:09, prior to the start of the procedure. Gregory [name] Minimum amount of bleeding was controlled with Pressure. The procedure was tolerated well with Gregory [name] pain level of 0 throughout and Gregory [name] pain level of 0 following the procedure. Post Debridement Measurements: 1.4cm length x 1.7cm width x 0.1cm depth; 0.187cm^3 volume. Character of Wound/Ulcer Post Debridement is improved. Severity of Tissue Post Debridement is: Fat layer exposed. Post procedure Diagnosis Wound #2: Same as Pre-Procedure Wound #4 Pre-procedure diagnosis of Wound #4 is Gregory [name] Pressure Ulcer located on the Right,Lateral Foot . There was Gregory [name] Excisional Skin/Subcutaneous Tissue Debridement with Gregory [name] total area of 0.24 sq cm performed by Fredirick Maudlin, MD. With the following instrument(s): Curette to remove Non-Viable tissue/material. Material removed includes Subcutaneous Tissue and Slough and after achieving pain control using Other (Benzocaine 20%). No specimens were taken. Gregory [name] time out was conducted at 14:09, prior to the start of the procedure. Gregory [name] Minimum amount of bleeding was controlled with Pressure. The procedure was tolerated well with Gregory [name] pain level of 0 throughout and Gregory [name] pain level of 0 following the procedure. Post Debridement Measurements: 0.6cm length x 0.4cm width x 0.1cm depth; 0.019cm^3 volume. Post debridement Stage noted as Category/Stage II. Character of Wound/Ulcer Post Debridement is improved. Post procedure Diagnosis Wound #4: Same as Pre-Procedure Wound #5 Pre-procedure diagnosis of Wound #5 is Gregory [name] Pressure Ulcer located on the Right,Medial T Great . There was Gregory [name] Excisional Skin/Subcutaneous Tissue oe Debridement with Gregory [name] total area of 4.8 sq cm performed by Fredirick Maudlin, MD. With the following instrument(s): Curette to remove Non-Viable tissue/material. Material removed includes Eschar and Subcutaneous Tissue and after achieving pain control using Other (Benzocaine 20%). No specimens were taken. Gregory [name] time out was conducted at 14:09, prior to the start of the procedure. Gregory [name] Minimum amount of bleeding was controlled with Pressure. The procedure was tolerated well with Gregory [name] pain level of 0 throughout and Gregory [name] pain level of 0 following the procedure. Post Debridement Measurements: 2cm length x 2.4cm width x 0.1cm depth; 0.377cm^3 volume. Post debridement Stage noted as Category/Stage II. Character of Wound/Ulcer Post Debridement is improved. Post procedure Diagnosis Wound #5: Same as Pre-Procedure Plan Follow-up Appointments: Return Appointment in 1 week. - Dr. Celine Ahr Room 3 Friday June 9th at 12:30 pm Bathing/ Shower/ Hygiene: May shower and wash wound with soap and water. Edema Control - Lymphedema / SCD / Other: Elevate legs  to the level of the heart or above for 30 minutes daily and/or when sitting, Gregory [name] frequency of: - throughout the day Avoid standing for long periods of time. Non Wound Condition: Protect area with: - Right and Left heels protect with Heel Cups Home Health: No change in wound care orders  this week; continue Home Health for wound care. May utilize formulary equivalent dressing for wound treatment orders unless otherwise specified. Other Home Health Orders/Instructions: - [facility] WOUND #1: - T Great Wound Laterality: Right oe Cleanser: Soap and Water 1 x Per Day/30 Days Discharge Instructions: May shower and wash wound with dial antibacterial soap and water prior to dressing change. Cleanser: Wound Cleanser 1 x Per Day/30 Days Discharge Instructions: Cleanse the wound with wound cleanser or normal saline prior to applying Gregory [name] clean dressing using gauze sponges, not tissue or cotton balls. Prim Dressing: BETADINE (Home Health) (Generic) 1 x Per Day/30 Days ary Discharge Instructions: paint right great toe Secondary Dressing: Woven Gauze Sponges 2x2 in ([place]) 1 x Per Day/30 Days Discharge Instructions: Apply over primary dressing as directed. Secured With: Child psychotherapist, Sterile 2x75 (in/in) (Home Health) 1 x Per Day/30 Days Discharge Instructions: Secure with stretch gauze as directed. WOUND #2: - T Great Wound Laterality: Left oe Cleanser: Soap and Water 1 x Per Day/30 Days Discharge Instructions: May shower and wash wound with dial antibacterial soap and water prior to dressing change. Cleanser: Wound Cleanser 1 x Per Day/30 Days Discharge Instructions: Cleanse the wound with wound cleanser or normal saline prior to applying Gregory [name] clean dressing using gauze sponges, not tissue or cotton balls. Prim Dressing: Iodosorb Gel 10 (gm) Tube (Home Health) (Generic) 1 x Per Day/30 Days ary Discharge Instructions: Apply to wound bed as instructed Secondary Dressing: Woven Gauze  Sponges 2x2 in ([place]) 1 x Per Day/30 Days Discharge Instructions: Apply over primary dressing as directed. Secured With: Child psychotherapist, Sterile 2x75 (in/in) (Home Health) 1 x Per Day/30 Days Discharge Instructions: Secure with stretch gauze as directed. WOUND #4: - Foot Wound Laterality: Right, Lateral Cleanser: Soap and Water (Home Health) 1 x Per Day/30 Days Discharge Instructions: May shower and wash wound with dial antibacterial soap and water prior to dressing change. Prim Dressing: KerraCel Ag Gelling Fiber Dressing, 2x2 in (silver alginate) 1 x Per Day/30 Days ary Discharge Instructions: Apply silver alginate to wound bed as instructed Prim Dressing: 1 x Per Day/30 Days ary Secondary Dressing: ALLEVYN Gentle Border, 3x3 (in/in) 1 x Per Day/30 Days Discharge Instructions: Apply over primary dressing as directed. WOUND #5: - T Great Wound Laterality: Right, Medial oe Cleanser: Soap and Water (Home Health) 1 x Per Day/30 Days Discharge Instructions: May shower and wash wound with dial antibacterial soap and water prior to dressing change. Prim Dressing: BETADINE 1 x Per Day/30 Days ary Discharge Instructions: Paint right great toe Secondary Dressing: ALLEVYN Gentle Border, 3x3 (in/in) 1 x Per Day/30 Days Discharge Instructions: Apply over primary dressing as directed. 07/13/2021: Both heels are closed. The right lateral foot wound is small, but about the same size as last week. There is some accumulated slough within the wound. The right great toe looks better today; it is no longer boggy and the Betadine paint has dried up the areas that were concerning to me. The left great toe looks to be responding nicely to the Iodosorb dressing. I used Gregory [name] curette to debride slough from the right lateral foot wound, eschar and subcutaneous tissue from both of the great toes. We Gregory continue using Iodosorb to the left great toe, Betadine paint to the right great toe, and  silver alginate to the right lateral foot wound. They should continue to avoid pressure on his heels as well as the tops of his toes and feet. Follow-up in 1  week. Electronic Signature(s) Signed: 07/13/2021 3:07:55 PM By: Fredirick Maudlin MD FACS Entered By: Fredirick Maudlin on 07/13/2021 15:07:54 -------------------------------------------------------------------------------- HxROS Details Patient Name: Date of Service: Gregory [name], Gregory [name] RNO LD 07/13/2021 1:15 PM Medical Record Number: [id number] Patient Account Number: [id number] Date of Birth/Sex: Treating RN: 05-16-1920 (88 y.o. Gregory [name] Primary Care Provider: Wylene Simmer Other Clinician: Referring Provider: Treating Provider/Extender: Roxy Cedar in Treatment: 9 Information Obtained From Patient Caregiver Cardiovascular Medical History: Positive for: Arrhythmia - Gregory [name]-Fib; Hypertension Past Medical History Notes: Pacemaker, Complete heart block Gastrointestinal Medical History: Past Medical History Notes: Dysphagia, PEG tube in place Endocrine Medical History: Past Medical History Notes: Hypothyroidism Genitourinary Medical History: Past Medical History Notes: CKD-3 Neurologic Medical History: Past Medical History Notes: Neuromuscular disorder Immunizations Pneumococcal Vaccine: Received Pneumococcal Vaccination: Yes Received Pneumococcal Vaccination On or After 60th Birthday: No Implantable Devices Yes Family and Social History Unknown History: Yes; Never smoker; Marital Status - Widowed; Alcohol Use: Never; Drug Use: No History; Caffeine Use: Never; Financial Concerns: No; Food, Clothing or Shelter Needs: No; Support System Lacking: No; Transportation Concerns: No Electronic Signature(s) Signed: 07/13/2021 4:49:27 PM By: Fredirick Maudlin MD FACS Signed: 07/13/2021 5:42:18 PM By: Dellie Catholic RN Entered By: Fredirick Maudlin on 07/13/2021  14:53:36 -------------------------------------------------------------------------------- SuperBill Details Patient Name: Date of Service: Gregory [name], Gregory [name] RNO LD 07/13/2021 Medical Record Number: [id number] Patient Account Number: [id number] Date of Birth/Sex: Treating RN: December 27, 1920 (88 y.o. Gregory [name] Primary Care Provider: Wylene Simmer Other Clinician: Referring Provider: Treating Provider/Extender: Roxy Cedar in Treatment: 9 Diagnosis Coding ICD-10 Codes Code Description [phone] Atherosclerosis of native arteries of extremities with gangrene, bilateral legs I73.9 Peripheral vascular disease, unspecified E43 Unspecified severe protein-calorie malnutrition N18.31 Chronic kidney disease, stage 3a R13.10 Dysphagia, unspecified L97.512 Non-pressure chronic ulcer of other part of right foot with fat layer exposed L97.522 Non-pressure chronic ulcer of other part of left foot with fat layer exposed L89.612 Pressure ulcer of right heel, stage 2 Facility Procedures CPT4 Code: [id number] Description: [place] - DEB SUBQ TISSUE 20 SQ CM/< ICD-10 Diagnosis Description L97.512 Non-pressure chronic ulcer of other part of right foot with fat layer exposed L97.522 Non-pressure chronic ulcer of other part of left foot with fat layer exposed Modifier: Quantity: 1 Physician Procedures : CPT4 Code Description Modifier [id number] - WC PHYS LEVEL 3 - EST PT 25 ICD-10 Diagnosis Description L97.512 Non-pressure chronic ulcer of other part of right foot with fat layer exposed L97.522 Non-pressure chronic ulcer of other part of left foot  with fat layer exposed I70.263 Atherosclerosis of native arteries of extremities with gangrene, bilateral legs E43 Unspecified severe protein-calorie malnutrition Quantity: 1 : [id number] - WC PHYS SUBQ TISS 20 SQ CM ICD-10 Diagnosis Description L97.512 Non-pressure chronic ulcer of other part of right foot with fat layer exposed  L97.522 Non-pressure chronic ulcer of other part of left foot with fat layer exposed Quantity: 1 Electronic Signature(s) Signed: 07/13/2021 3:08:25 PM By: Fredirick Maudlin MD FACS Entered By: Fredirick Maudlin on 07/13/2021 76:16:07

## 2021-07-18 ENCOUNTER — Other Ambulatory Visit: Payer: Self-pay | Admitting: Physical Medicine and Rehabilitation

## 2021-07-18 NOTE — Progress Notes (Signed)
Remote pacemaker transmission.   

## 2021-07-21 ENCOUNTER — Encounter (HOSPITAL_BASED_OUTPATIENT_CLINIC_OR_DEPARTMENT_OTHER): Payer: Medicare Other | Admitting: General Surgery

## 2021-07-21 DIAGNOSIS — L97512 Non-pressure chronic ulcer of other part of right foot with fat layer exposed: Secondary | ICD-10-CM | POA: Diagnosis not present

## 2021-07-21 NOTE — Progress Notes (Signed)
KENARD, MORAWSKI ([id number]) Visit Report for 07/21/2021 Chief Complaint Document Details Patient Name: Date of Service: Gregory [name] [facility] LD 07/21/2021 12:30 PM Medical Record Number: [id number] Patient Account Number: [id number] Date of Birth/Sex: Treating RN: 1920-07-13 (88 y.o. [name] Primary Care Provider: Wylene Simmer Other Clinician: Referring Provider: Treating Provider/Extender: Roxy Cedar in Treatment: 11 Information Obtained from: Patient Chief Complaint Patient is at the clinic for treatment of an open pressure ulcer on his left heel and dry gangrene of both great toes Electronic Signature(s) Signed: 07/21/2021 1:39:00 PM By: Fredirick Maudlin MD FACS Entered By: Fredirick Maudlin on 07/21/2021 13:39:00 -------------------------------------------------------------------------------- Debridement Details Patient Name: Date of Service: Gregory [name], Gregory [name] LD 07/21/2021 12:30 PM Medical Record Number: [id number] Patient Account Number: [id number] Date of Birth/Sex: Treating RN: [birth year] (88 y.o. [name] Primary Care Provider: Wylene Simmer Other Clinician: Referring Provider: Treating Provider/Extender: Roxy Cedar in Treatment: 11 Debridement Performed for Assessment: Wound #1 Right T Great oe Performed By: Physician Fredirick Maudlin, MD Debridement Type: Debridement Severity of Tissue Pre Debridement: Fat layer exposed Level of Consciousness (Pre-procedure): Awake and Alert Pre-procedure Verification/Time Out Yes - 13:19 Taken: Start Time: 13:19 Pain Control: Other : Benzocaine 20% T Area Debrided (L x W): otal 0.9 (cm) x 0.8 (cm) = 0.72 (cm) Tissue and other material debrided: Non-Viable, Eschar, Slough, Subcutaneous, Slough Level: Skin/Subcutaneous Tissue Debridement Description: Excisional Instrument: Curette Bleeding: Minimum Hemostasis Achieved: Pressure End Time:  13:22 Procedural Pain: 0 Post Procedural Pain: 0 Response to Treatment: Procedure was tolerated well Level of Consciousness (Post- Awake and Alert procedure): Post Debridement Measurements of Total Wound Length: (cm) 0.9 Width: (cm) 0.8 Depth: (cm) 0.1 Volume: (cm) 0.057 Character of Wound/Ulcer Post Debridement: Improved Severity of Tissue Post Debridement: Fat layer exposed Post Procedure Diagnosis Same as Pre-procedure Electronic Signature(s) Signed: 07/21/2021 3:49:59 PM By: Fredirick Maudlin MD FACS Signed: 07/21/2021 5:26:33 PM By: Dellie Catholic RN Entered By: Dellie Catholic on 07/21/2021 13:27:16 -------------------------------------------------------------------------------- Debridement Details Patient Name: Date of Service: Gregory [name], Gregory [name] LD 07/21/2021 12:30 PM Medical Record Number: [id number] Patient Account Number: [id number] Date of Birth/Sex: Treating RN: Feb 05, 1921 (88 y.o. [name] Primary Care Provider: Wylene Simmer Other Clinician: Referring Provider: Treating Provider/Extender: Roxy Cedar in Treatment: 11 Debridement Performed for Assessment: Wound #4 Right,Lateral Foot Performed By: Physician Fredirick Maudlin, MD Debridement Type: Debridement Level of Consciousness (Pre-procedure): Awake and Alert Pre-procedure Verification/Time Out Yes - 13:19 Taken: Start Time: 13:19 Pain Control: Other : Benzocaine 20% T Area Debrided (L x W): otal 0.3 (cm) x 0.3 (cm) = 0.09 (cm) Tissue and other material debrided: Non-Viable, Eschar, Slough, Subcutaneous, Slough Level: Skin/Subcutaneous Tissue Debridement Description: Excisional Instrument: Curette Bleeding: Minimum Hemostasis Achieved: Pressure End Time: 13:22 Procedural Pain: 0 Post Procedural Pain: 0 Response to Treatment: Procedure was tolerated well Level of Consciousness (Post- Awake and Alert procedure): Post Debridement Measurements of Total  Wound Length: (cm) 0.3 Stage: Category/Stage II Width: (cm) 0.3 Depth: (cm) 0.1 Volume: (cm) 0.007 Character of Wound/Ulcer Post Debridement: Improved Post Procedure Diagnosis Same as Pre-procedure Electronic Signature(s) Signed: 07/21/2021 3:49:59 PM By: Fredirick Maudlin MD FACS Signed: 07/21/2021 5:26:33 PM By: Dellie Catholic RN Entered By: Dellie Catholic on 07/21/2021 13:28:48 -------------------------------------------------------------------------------- Debridement Details Patient Name: Date of Service: Gregory [name], Gregory [name] LD 07/21/2021 12:30 PM Medical Record Number: [id number] Patient Account Number: [id number] Date of Birth/Sex: Treating RN: [birth year] (88 y.o. [name] Primary Care Provider: Wylene Simmer Other Clinician:  Referring Provider: Treating Provider/Extender: Roxy Cedar in Treatment: 11 Debridement Performed for Assessment: Wound #5 Right,Medial T Great oe Performed By: Physician Fredirick Maudlin, MD Debridement Type: Debridement Level of Consciousness (Pre-procedure): Awake and Alert Pre-procedure Verification/Time Out Yes - 13:19 Taken: Start Time: 13:19 Pain Control: Other : Benzocaine 20% T Area Debrided (L x W): otal 0.4 (cm) x 0.6 (cm) = 0.24 (cm) Tissue and other material debrided: Non-Viable, Eschar, Slough, Subcutaneous, Slough Level: Skin/Subcutaneous Tissue Debridement Description: Excisional Instrument: Curette Bleeding: Minimum Hemostasis Achieved: Pressure End Time: 13:22 Procedural Pain: 0 Post Procedural Pain: 0 Response to Treatment: Procedure was tolerated well Level of Consciousness (Post- Awake and Alert procedure): Post Debridement Measurements of Total Wound Length: (cm) 0.4 Stage: Category/Stage II Width: (cm) 0.6 Depth: (cm) 0.1 Volume: (cm) 0.019 Character of Wound/Ulcer Post Debridement: Improved Post Procedure Diagnosis Same as Pre-procedure Electronic Signature(s) Signed:  07/21/2021 3:49:59 PM By: Fredirick Maudlin MD FACS Signed: 07/21/2021 5:26:33 PM By: Dellie Catholic RN Entered By: Dellie Catholic on 07/21/2021 13:29:02 -------------------------------------------------------------------------------- Debridement Details Patient Name: Date of Service: Gregory [name], Gregory [name] LD 07/21/2021 12:30 PM Medical Record Number: [id number] Patient Account Number: [id number] Date of Birth/Sex: Treating RN: Sep 28, 1920 (88 y.o. [name] Primary Care Provider: Wylene Simmer Other Clinician: Referring Provider: Treating Provider/Extender: Roxy Cedar in Treatment: 11 Debridement Performed for Assessment: Wound #2 Left T Great oe Performed By: Physician Fredirick Maudlin, MD Debridement Type: Debridement Severity of Tissue Pre Debridement: Fat layer exposed Level of Consciousness (Pre-procedure): Awake and Alert Pre-procedure Verification/Time Out Yes - 13:19 Taken: Start Time: 13:19 Pain Control: Other : Benzocaine 20% T Area Debrided (L x W): otal 0.9 (cm) x 1.1 (cm) = 0.99 (cm) Tissue and other material debrided: Non-Viable, Eschar, Slough, Subcutaneous, Slough Level: Skin/Subcutaneous Tissue Debridement Description: Excisional Instrument: Curette Bleeding: Minimum Hemostasis Achieved: Pressure End Time: 13:22 Procedural Pain: 0 Post Procedural Pain: 0 Response to Treatment: Procedure was tolerated well Level of Consciousness (Post- Awake and Alert procedure): Post Debridement Measurements of Total Wound Length: (cm) 0.9 Width: (cm) 1.1 Depth: (cm) 0.1 Volume: (cm) 0.078 Character of Wound/Ulcer Post Debridement: Improved Severity of Tissue Post Debridement: Fat layer exposed Post Procedure Diagnosis Same as Pre-procedure Electronic Signature(s) Signed: 07/21/2021 3:49:59 PM By: Fredirick Maudlin MD FACS Signed: 07/21/2021 5:26:33 PM By: Dellie Catholic RN Entered By: Dellie Catholic on 07/21/2021  13:32:37 -------------------------------------------------------------------------------- HPI Details Patient Name: Date of Service: Gregory [name], Gregory [name] LD 07/21/2021 12:30 PM Medical Record Number: [id number] Patient Account Number: [id number] Date of Birth/Sex: Treating RN: 1920-10-04 (88 y.o. [name] Primary Care Provider: Wylene Simmer Other Clinician: Referring Provider: Treating Provider/Extender: Roxy Cedar in Treatment: 11 History of Present Illness HPI Description: ADMISSION 05/05/2021 This is an extremely frail 88 year old man who presents to clinic today accompanied by his son for evaluation of Gregory pressure ulcer on his left heel. He also has dry gangrene on the tips of his bilateral great toes and the lateral aspect of both feet. It sounds like after Gregory hospitalization for Gregory GI bleed, he had Gregory blanket on his feet which ultimately resulted in Gregory pressure injury. He has severe dysphagia and is dependent on G-tube feeding. The wound on his heel has been present since January. He has 24-hour care and they have been applying Xeroform to the heel. He apparently has some sort of air boot that he is meant to wear in bed at night. He has severe peripheral arterial disease as evidenced  by the ABIs obtained by Dr. Rachelle Hora and copied here: ABI Findings: +---------+------------------+-----+----------+--------+ Right Rt Pressure (mmHg)IndexWaveform Comment  +---------+------------------+-----+----------+--------+ Brachial 136     +---------+------------------+-----+----------+--------+ PTA >255 [place] monophasic  +---------+------------------+-----+----------+--------+ DP >255 [place] monophasic  +---------+------------------+-----+----------+--------+ Great T oe24 0.18    +---------+------------------+-----+----------+--------+ +---------+------------------+-----+----------+-------+ Left Lt Pressure (mmHg)IndexWaveform  Comment +---------+------------------+-----+----------+-------+ Brachial 136     +---------+------------------+-----+----------+-------+ PTA >255 [place] monophasic  +---------+------------------+-----+----------+-------+ DP >255 [place] monophasic  +---------+------------------+-----+----------+-------+ Great T oe0 0.00 Absent   +---------+------------------+-----+----------+-------+ +-------+-----------+-----------+------------+------------+ ABI/TBIT oday's ABIT oday's TBIPrevious ABIPrevious TBI +-------+-----------+-----------+------------+------------+ Right [place] 0.18    +-------+-----------+-----------+------------+------------+ Left  0.00    +-------+-----------+-----------+------------+------------+ Summary: Right: Resting right ankle-brachial index indicates noncompressible right lower extremity arteries. The right toe-brachial index is abnormal. Left: Resting left ankle-brachial index indicates noncompressible left lower extremity arteries. Absent left great toe PPG waveform. He saw Dr. Rachelle Hora on March 16 and at that time, he was felt to be far too frail to undergo any sort of intervention including an aortogram. Certainly not Gregory surgical candidate. According to the electronic medical record, Dr. Rachelle Hora is planning to see him back in Gregory couple of weeks and Gregory determine if he has made any significant improvement in his overall status and might be suitable for an intervention. I read his note to suggest that the patient has had multiple complications from other procedures and that even an aortogram may be too risky. 05/12/2021: Rather remarkably, the eschar and dry gangrene on his bilateral great toes and bilateral fifth metatarsals actually looks better. Some of the eschar on his toes is peeling away to reveal healing tissue underneath. This is also the case with his lateral foot bilaterally. The heel is clean and superficial, with minimal  slough. 06/07/2021: Since our last visit, Mr. Perea has been in the hospital quite Gregory bit. While in the hospital, they were painting his lateral feet and great toes with Betadine. The wound on his heel is nearly closed with just Gregory sliver of open skin. They have been floating his heels as directed and his home health nurse recently ordered Gregory device to keep his blankets from rubbing on his toes. They have continued using the Betadine on his lateral feet and great toes and the Prisma silver collagen on his left heel. He does have Gregory bit of callus buildup on his right Achilles tendon area suggesting there is some pressure being applied. 06/15/2021: The left heel wound is closed. There is some slough buildup in the right lateral foot wound. Both great toe wounds have Gregory layer of thick dark eschar, but they appear to have some viable tissue underneath this. He is complaining of pain in his right heel; he had Gregory wound there at one time. On inspection, there is no open skin at this site. 06/23/2021: The left heel remains closed. The right heel never has opened. Both great toe wounds continue to have Gregory layer of thick dark eschar, but it is softer today. The right lateral foot wound also has eschar overlying the surface which has some accumulated slough. He also has areas on his great toes bilaterally that look like they have been rubbing on the slippers that he wears all day. 07/06/2021: The right heel has Gregory new callus on it. It peeled off as I was examining the site and there is Gregory small open wound in that area. The right great toe looks worse today; there are more darkened areas of eschar and the toe itself feels Gregory bit boggy. The right lateral foot is small  with just Gregory small amount of slough. The left great toe is in reasonable condition with just Gregory layer of fairly soft eschar at the tip. According to the patient's family, he has been insisting upon blankets being wrapped over his feet at night and he does not  cooperate with heel flotation. He also required Gregory 2 unit blood transfusion last week for fairly significant anemia. 07/13/2021: Both heels are closed. The right lateral foot wound is small, but about the same size as last week. There is some accumulated slough within the wound. The right great toe looks better today; it is no longer boggy and the Betadine paint has dried up the areas that were concerning to me. The left great toe looks to be responding nicely to the Iodosorb dressing. 07/21/2021: The heels remain closed. The right lateral foot wound is about the same size as last week, but he is complaining of more pain in the area and there is some perimeter erythema. The right great toe is looking much better today; the Betadine paint continues to keep the areas of concern dry. The left great toe is doing well with Gregory nice wound surface underneath the crusted-on Iodosorb. Electronic Signature(s) Signed: 07/21/2021 1:40:12 PM By: Fredirick Maudlin MD FACS Entered By: Fredirick Maudlin on 07/21/2021 13:40:11 -------------------------------------------------------------------------------- Physical Exam Details Patient Name: Date of Service: Gregory [name], Gregory [name] LD 07/21/2021 12:30 PM Medical Record Number: [id number] Patient Account Number: [id number] Date of Birth/Sex: Treating RN: 08-Dec-1920 (88 y.o. [name] Primary Care Provider: Wylene Simmer Other Clinician: Referring Provider: Treating Provider/Extender: Roxy Cedar in Treatment: 11 Constitutional . . . . No acute distress.Marland Kitchen Respiratory Normal work of breathing on room air.. Notes 07/21/2021: The heels remain closed. The right lateral foot wound is about the same size as last week, but he is complaining of more pain in the area and there is some perimeter erythema. The right great toe is looking much better today; the Betadine paint continues to keep the areas of concern dry. The left great toe is doing well  with Gregory nice wound surface underneath the crusted-on Iodosorb. Electronic Signature(s) Signed: 07/21/2021 1:41:01 PM By: Fredirick Maudlin MD FACS Signed: 07/21/2021 1:41:01 PM By: Fredirick Maudlin MD FACS Entered By: Fredirick Maudlin on 07/21/2021 13:41:01 -------------------------------------------------------------------------------- Physician Orders Details Patient Name: Date of Service: Gregory [name], Gregory [name] LD 07/21/2021 12:30 PM Medical Record Number: [id number] Patient Account Number: [id number] Date of Birth/Sex: Treating RN: 27-Nov-1920 (88 y.o. [name] Primary Care Provider: Wylene Simmer Other Clinician: Referring Provider: Treating Provider/Extender: Roxy Cedar in Treatment: 11 Verbal / Phone Orders: No Diagnosis Coding ICD-10 Coding Code Description I70.263 Atherosclerosis of native arteries of extremities with gangrene, bilateral legs I73.9 Peripheral vascular disease, unspecified E43 Unspecified severe protein-calorie malnutrition N18.31 Chronic kidney disease, stage 3a R13.10 Dysphagia, unspecified L97.512 Non-pressure chronic ulcer of other part of right foot with fat layer exposed L97.522 Non-pressure chronic ulcer of other part of left foot with fat layer exposed L89.612 Pressure ulcer of right heel, stage 2 Follow-up Appointments ppointment in 1 week. - Dr. Celine Ahr Room 3 Return Gregory Bathing/ Shower/ Hygiene May shower and wash wound with soap and water. Edema Control - Lymphedema / SCD / Other Elevate legs to the level of the heart or above for 30 minutes daily and/or when sitting, Gregory frequency of: - throughout the day Avoid standing for long periods of time. Non Wound Condition Bilateral Lower Extremities Protect area with: - Right and  Left heels protect with [place] No change in wound care orders this week; continue Home Health for wound care. May utilize formulary equivalent dressing for wound treatment orders  unless otherwise specified. Other Home Health Orders/Instructions: - [facility] Wound Treatment Wound #1 - T Great oe Wound Laterality: Right Cleanser: Soap and Water 1 x Per Day/30 Days Discharge Instructions: May shower and wash wound with dial antibacterial soap and water prior to dressing change. Cleanser: Wound Cleanser 1 x Per Day/30 Days Discharge Instructions: Cleanse the wound with wound cleanser or normal saline prior to applying Gregory clean dressing using gauze sponges, not tissue or cotton balls. Prim Dressing: BETADINE (Home Health) (Generic) 1 x Per Day/30 Days ary Discharge Instructions: paint right great toe Secondary Dressing: Woven Gauze Sponges 2x2 in ([place]) 1 x Per Day/30 Days Discharge Instructions: Apply over primary dressing as directed. Secured With: Child psychotherapist, Sterile 2x75 (in/in) (Home Health) 1 x Per Day/30 Days Discharge Instructions: Secure with stretch gauze as directed. Wound #2 - T Great oe Wound Laterality: Left Cleanser: Soap and Water 1 x Per Day/30 Days Discharge Instructions: May shower and wash wound with dial antibacterial soap and water prior to dressing change. Cleanser: Wound Cleanser 1 x Per Day/30 Days Discharge Instructions: Cleanse the wound with wound cleanser or normal saline prior to applying Gregory clean dressing using gauze sponges, not tissue or cotton balls. Prim Dressing: Iodosorb Gel 10 (gm) Tube (Home Health) (Generic) 1 x Per Day/30 Days ary Discharge Instructions: Apply to wound bed as instructed Secondary Dressing: Woven Gauze Sponges 2x2 in ([place]) 1 x Per Day/30 Days Discharge Instructions: Apply over primary dressing as directed. Secured With: Child psychotherapist, Sterile 2x75 (in/in) (Home Health) 1 x Per Day/30 Days Discharge Instructions: Secure with stretch gauze as directed. Wound #4 - Foot Wound Laterality: Right, Lateral Cleanser: Soap and Water (Home Health) 1 x Per Day/30  Days Discharge Instructions: May shower and wash wound with dial antibacterial soap and water prior to dressing change. Topical: Gentamicin 1 x Per Day/30 Days Discharge Instructions: As directed by physician Prim Dressing: KerraCel Ag Gelling Fiber Dressing, 2x2 in (silver alginate) 1 x Per Day/30 Days ary Discharge Instructions: Apply silver alginate to wound bed as instructed Prim Dressing: 1 x Per Day/30 Days ary Secondary Dressing: ALLEVYN Gentle Border, 3x3 (in/in) 1 x Per Day/30 Days Discharge Instructions: Apply over primary dressing as directed. Wound #5 - T Great oe Wound Laterality: Right, Medial Cleanser: Soap and Water (Home Health) 1 x Per Day/30 Days Discharge Instructions: May shower and wash wound with dial antibacterial soap and water prior to dressing change. Prim Dressing: BETADINE 1 x Per Day/30 Days ary Discharge Instructions: Paint right great toe Secondary Dressing: ALLEVYN Gentle Border, 3x3 (in/in) 1 x Per Day/30 Days Discharge Instructions: Apply over primary dressing as directed. Consults Podiatry - Please evaluate T Nails for nails to be trimmed. oe Patient Medications llergies: cefepime, amlodipine, penicillin Gregory Notifications Medication Indication Start End 07/21/2021 gentamicin DOSE topical 0.1 % ointment - apply to right lateral foot wound with dressing changes Electronic Signature(s) Signed: 07/21/2021 1:42:12 PM By: Fredirick Maudlin MD FACS Entered By: Fredirick Maudlin on 07/21/2021 13:42:11 Prescription 07/21/2021 -------------------------------------------------------------------------------- Servando Snare MD Patient Name: Provider: Aug 08, 1920 [id number] Date of Birth: NPI#Jerilynn Mages [id number] Sex: DEA #: [phone] Phone #: License #: [place] Patient Address: [address] [place], [place] [address] [address]  [place], [place]  [address] [phone] Allergies cefepime; amlodipine; penicillin Provider's Orders Podiatry - Please evaluate T Nails for nails to be trimmed. oe Hand Signature: Date(s): Electronic Signature(s) Signed: 07/21/2021 1:43:57 PM By: Fredirick Maudlin MD FACS Entered By: Fredirick Maudlin on 07/21/2021 13:43:57 -------------------------------------------------------------------------------- Problem List Details Patient Name: Date of Service: Gregory [name], Gregory [name] LD 07/21/2021 12:30 PM Medical Record Number: [id number] Patient Account Number: [id number] Date of Birth/Sex: Treating RN: 1920/05/15 (88 y.o. [name] Primary Care Provider: Wylene Simmer Other Clinician: Referring Provider: Treating Provider/Extender: Roxy Cedar in Treatment: 11 Active Problems ICD-10 Encounter Code Description Active Date MDM Diagnosis I70.263 Atherosclerosis of native arteries of extremities with gangrene, bilateral legs 05/05/2021 No Yes I73.9 Peripheral vascular disease, unspecified 05/05/2021 No Yes E43 Unspecified severe protein-calorie malnutrition 05/05/2021 No Yes N18.31 Chronic kidney disease, stage 3a 05/05/2021 No Yes R13.10 Dysphagia, unspecified 05/05/2021 No Yes L97.512 Non-pressure chronic ulcer of other part of right foot with fat layer exposed 06/07/2021 No Yes L97.522 Non-pressure chronic ulcer of other part of left foot with fat layer exposed 06/07/2021 No Yes L89.612 Pressure ulcer of right heel, stage 2 07/06/2021 No Yes Inactive Problems ICD-10 Code Description Active Date Inactive Date L89.622 Pressure ulcer of left heel, stage 2 05/05/2021 05/05/2021 Resolved Problems Electronic Signature(s) Signed: 07/21/2021 1:37:41 PM By: Fredirick Maudlin MD FACS Entered By: Fredirick Maudlin on 07/21/2021 13:37:41 -------------------------------------------------------------------------------- Progress Note Details Patient Name: Date of Service: Gregory [name], Gregory [name] LD  07/21/2021 12:30 PM Medical Record Number: [id number] Patient Account Number: [id number] Date of Birth/Sex: Treating RN: 07/14/1920 (88 y.o. [name] Primary Care Provider: Wylene Simmer Other Clinician: Referring Provider: Treating Provider/Extender: Roxy Cedar in Treatment: 11 Subjective Chief Complaint Information obtained from Patient Patient is at the clinic for treatment of an open pressure ulcer on his left heel and dry gangrene of both great toes History of Present Illness (HPI) ADMISSION 05/05/2021 This is an extremely frail 88 year old man who presents to clinic today accompanied by his son for evaluation of Gregory pressure ulcer on his left heel. He also has dry gangrene on the tips of his bilateral great toes and the lateral aspect of both feet. It sounds like after Gregory hospitalization for Gregory GI bleed, he had Gregory blanket on his feet which ultimately resulted in Gregory pressure injury. He has severe dysphagia and is dependent on G-tube feeding. The wound on his heel has been present since January. He has 24-hour care and they have been applying Xeroform to the heel. He apparently has some sort of air boot that he is meant to wear in bed at night. He has severe peripheral arterial disease as evidenced by the ABIs obtained by Dr. Rachelle Hora and copied here: ABI Findings: +---------+------------------+-----+----------+--------+ Right Rt Pressure (mmHg)IndexWaveform Comment  +---------+------------------+-----+----------+--------+ Brachial 136    +---------+------------------+-----+----------+--------+ PTA >255 [place] monophasic  +---------+------------------+-----+----------+--------+ DP >255 [place] monophasic  +---------+------------------+-----+----------+--------+ Great T oe24 0.18    +---------+------------------+-----+----------+--------+ +---------+------------------+-----+----------+-------+ Left Lt Pressure  (mmHg)IndexWaveform Comment +---------+------------------+-----+----------+-------+ Brachial 136    +---------+------------------+-----+----------+-------+ PTA >255 [place] monophasic  +---------+------------------+-----+----------+-------+ DP >255 [place] monophasic  +---------+------------------+-----+----------+-------+ Great T oe0 0.00 Absent   +---------+------------------+-----+----------+-------+ +-------+-----------+-----------+------------+------------+ ABI/TBIT oday's ABIT oday's TBIPrevious ABIPrevious TBI +-------+-----------+-----------+------------+------------+ Right [place] 0.18    +-------+-----------+-----------+------------+------------+ Left [place] 0.00    +-------+-----------+-----------+------------+------------+ Summary: Right: Resting right ankle-brachial index indicates noncompressible right lower extremity arteries. The right toe-brachial index is abnormal. Left: Resting left ankle-brachial index indicates noncompressible left lower extremity arteries. Absent left great toe PPG waveform.  He saw Dr. Rachelle Hora on March 16 and at that time, he was felt to be far too frail to undergo any sort of intervention including an aortogram. Certainly not Gregory surgical candidate. According to the electronic medical record, Dr. Rachelle Hora is planning to see him back in Gregory couple of weeks and Gregory determine if he has made any significant improvement in his overall status and might be suitable for an intervention. I read his note to suggest that the patient has had multiple complications from other procedures and that even an aortogram may be too risky. 05/12/2021: Rather remarkably, the eschar and dry gangrene on his bilateral great toes and bilateral fifth metatarsals actually looks better. Some of the eschar on his toes is peeling away to reveal healing tissue underneath. This is also the case with his lateral foot bilaterally. The heel is clean and  superficial, with minimal slough. 06/07/2021: Since our last visit, Mr. Chiarelli has been in the hospital quite Gregory bit. While in the hospital, they were painting his lateral feet and great toes with Betadine. The wound on his heel is nearly closed with just Gregory sliver of open skin. They have been floating his heels as directed and his home health nurse recently ordered Gregory device to keep his blankets from rubbing on his toes. They have continued using the Betadine on his lateral feet and great toes and the Prisma silver collagen on his left heel. He does have Gregory bit of callus buildup on his right Achilles tendon area suggesting there is some pressure being applied. 06/15/2021: The left heel wound is closed. There is some slough buildup in the right lateral foot wound. Both great toe wounds have Gregory layer of thick dark eschar, but they appear to have some viable tissue underneath this. He is complaining of pain in his right heel; he had Gregory wound there at one time. On inspection, there is no open skin at this site. 06/23/2021: The left heel remains closed. The right heel never has opened. Both great toe wounds continue to have Gregory layer of thick dark eschar, but it is softer today. The right lateral foot wound also has eschar overlying the surface which has some accumulated slough. He also has areas on his great toes bilaterally that look like they have been rubbing on the slippers that he wears all day. 07/06/2021: The right heel has Gregory new callus on it. It peeled off as I was examining the site and there is Gregory small open wound in that area. The right great toe looks worse today; there are more darkened areas of eschar and the toe itself feels Gregory bit boggy. The right lateral foot is small with just Gregory small amount of slough. The left great toe is in reasonable condition with just Gregory layer of fairly soft eschar at the tip. According to the patient's family, he has been insisting upon blankets being wrapped over his feet at  night and he does not cooperate with heel flotation. He also required Gregory 2 unit blood transfusion last week for fairly significant anemia. 07/13/2021: Both heels are closed. The right lateral foot wound is small, but about the same size as last week. There is some accumulated slough within the wound. The right great toe looks better today; it is no longer boggy and the Betadine paint has dried up the areas that were concerning to me. The left great toe looks to be responding nicely to the Iodosorb dressing. 07/21/2021: The heels remain closed. The  right lateral foot wound is about the same size as last week, but he is complaining of more pain in the area and there is some perimeter erythema. The right great toe is looking much better today; the Betadine paint continues to keep the areas of concern dry. The left great toe is doing well with Gregory nice wound surface underneath the crusted-on Iodosorb. Patient History Information obtained from Patient, Caregiver. Family History Unknown History. Social History Never smoker, Marital Status - Widowed, Alcohol Use - Never, Drug Use - No History, Caffeine Use - Never. Medical History Cardiovascular Patient has history of Arrhythmia - Gregory-Fib, Hypertension Medical Gregory Surgical History Notes nd Cardiovascular Pacemaker, Complete heart block Gastrointestinal Dysphagia, PEG tube in place Endocrine Hypothyroidism Genitourinary CKD-3 Neurologic Neuromuscular disorder Objective Constitutional No acute distress.. Vitals Time Taken: 12:47 PM, Temperature: 98.2 F, Pulse: 84 bpm, Respiratory Rate: 18 breaths/min, Blood Pressure: 118/69 mmHg. Respiratory Normal work of breathing on room air.. General Notes: 07/21/2021: The heels remain closed. The right lateral foot wound is about the same size as last week, but he is complaining of more pain in the area and there is some perimeter erythema. The right great toe is looking much better today; the Betadine paint  continues to keep the areas of concern dry. The left great toe is doing well with Gregory nice wound surface underneath the crusted-on Iodosorb. Integumentary (Hair, Skin) Wound #1 status is Open. Original cause of wound was Pressure Injury. The date acquired was: 02/12/2021. The wound has been in treatment 11 weeks. The wound is located on the Right T Great. The wound measures 0.9cm length x 0.8cm width x 0.1cm depth; 0.565cm^2 area and 0.057cm^3 volume. There is Fat oe Layer (Subcutaneous Tissue) exposed. There is no tunneling or undermining noted. There is Gregory medium amount of serosanguineous drainage noted. The wound margin is flat and intact. There is small (1-33%) red granulation within the wound bed. There is Gregory large (67-100%) amount of necrotic tissue within the wound bed including Eschar. Wound #2 status is Open. Original cause of wound was Pressure Injury. The date acquired was: 02/12/2021. The wound has been in treatment 11 weeks. The wound is located on the Left T Great. The wound measures 0.9cm length x 1.1cm width x 0.1cm depth; 0.778cm^2 area and 0.078cm^3 volume. There is Fat oe Layer (Subcutaneous Tissue) exposed. There is no tunneling or undermining noted. There is Gregory small amount of serosanguineous drainage noted. The wound margin is flat and intact. There is no granulation within the wound bed. There is Gregory large (67-100%) amount of necrotic tissue within the wound bed including Eschar and Adherent Slough. Wound #4 status is Open. Original cause of wound was Gradually Appeared. The date acquired was: 04/12/2021. The wound has been in treatment 6 weeks. The wound is located on the Right,Lateral Foot. The wound measures 0.3cm length x 0.3cm width x 0.1cm depth; 0.071cm^2 area and 0.007cm^3 volume. There is Fat Layer (Subcutaneous Tissue) exposed. There is no tunneling or undermining noted. There is Gregory small amount of serosanguineous drainage noted. The wound margin is flat and intact. There is  small (1-33%) red granulation within the wound bed. There is Gregory large (67-100%) amount of necrotic tissue within the wound bed including Eschar. Wound #5 status is Open. Original cause of wound was Blister. The date acquired was: 06/12/2021. The wound has been in treatment 4 weeks. The wound is located on the Right,Medial T Great. The wound measures 0.4cm length x 0.6cm width x 0.1cm  depth; 0.188cm^2 area and 0.019cm^3 volume. There is Fat oe Layer (Subcutaneous Tissue) exposed. There is no tunneling or undermining noted. There is Gregory medium amount of serosanguineous drainage noted. There is small (1-33%) red granulation within the wound bed. There is Gregory large (67-100%) amount of necrotic tissue within the wound bed including Eschar and Adherent Slough. Assessment Active Problems ICD-10 Atherosclerosis of native arteries of extremities with gangrene, bilateral legs Peripheral vascular disease, unspecified Unspecified severe protein-calorie malnutrition Chronic kidney disease, stage 3a Dysphagia, unspecified Non-pressure chronic ulcer of other part of right foot with fat layer exposed Non-pressure chronic ulcer of other part of left foot with fat layer exposed Pressure ulcer of right heel, stage 2 Procedures Wound #1 Pre-procedure diagnosis of Wound #1 is an Arterial Insufficiency Ulcer located on the Right T Great .Severity of Tissue Pre Debridement is: Fat layer oe exposed. There was Gregory Excisional Skin/Subcutaneous Tissue Debridement with Gregory total area of 0.72 sq cm performed by Fredirick Maudlin, MD. With the following instrument(s): Curette to remove Non-Viable tissue/material. Material removed includes Eschar, Subcutaneous Tissue, and Slough after achieving pain control using Other (Benzocaine 20%). No specimens were taken. Gregory time out was conducted at 13:19, prior to the start of the procedure. Gregory Minimum amount of bleeding was controlled with Pressure. The procedure was tolerated well with Gregory  pain level of 0 throughout and Gregory pain level of 0 following the procedure. Post Debridement Measurements: 0.9cm length x 0.8cm width x 0.1cm depth; 0.057cm^3 volume. Character of Wound/Ulcer Post Debridement is improved. Severity of Tissue Post Debridement is: Fat layer exposed. Post procedure Diagnosis Wound #1: Same as Pre-Procedure Wound #2 Pre-procedure diagnosis of Wound #2 is an Arterial Insufficiency Ulcer located on the Left T Great .Severity of Tissue Pre Debridement is: Fat layer oe exposed. There was Gregory Excisional Skin/Subcutaneous Tissue Debridement with Gregory total area of 0.99 sq cm performed by Fredirick Maudlin, MD. With the following instrument(s): Curette to remove Non-Viable tissue/material. Material removed includes Eschar, Subcutaneous Tissue, and Slough after achieving pain control using Other (Benzocaine 20%). No specimens were taken. Gregory time out was conducted at 13:19, prior to the start of the procedure. Gregory Minimum amount of bleeding was controlled with Pressure. The procedure was tolerated well with Gregory pain level of 0 throughout and Gregory pain level of 0 following the procedure. Post Debridement Measurements: 0.9cm length x 1.1cm width x 0.1cm depth; 0.078cm^3 volume. Character of Wound/Ulcer Post Debridement is improved. Severity of Tissue Post Debridement is: Fat layer exposed. Post procedure Diagnosis Wound #2: Same as Pre-Procedure Wound #4 Pre-procedure diagnosis of Wound #4 is Gregory Pressure Ulcer located on the Right,Lateral Foot . There was Gregory Excisional Skin/Subcutaneous Tissue Debridement with Gregory total area of 0.09 sq cm performed by Fredirick Maudlin, MD. With the following instrument(s): Curette to remove Non-Viable tissue/material. Material removed includes Eschar, Subcutaneous Tissue, and Slough after achieving pain control using Other (Benzocaine 20%). No specimens were taken. Gregory time out was conducted at 13:19, prior to the start of the procedure. Gregory Minimum amount of bleeding  was controlled with Pressure. The procedure was tolerated well with Gregory pain level of 0 throughout and Gregory pain level of 0 following the procedure. Post Debridement Measurements: 0.3cm length x 0.3cm width x 0.1cm depth; 0.007cm^3 volume. Post debridement Stage noted as Category/Stage II. Character of Wound/Ulcer Post Debridement is improved. Post procedure Diagnosis Wound #4: Same as Pre-Procedure Wound #5 Pre-procedure diagnosis of Wound #5 is Gregory Pressure Ulcer located on the Right,Medial T  Great . There was Gregory Excisional Skin/Subcutaneous Tissue oe Debridement with Gregory total area of 0.24 sq cm performed by Fredirick Maudlin, MD. With the following instrument(s): Curette to remove Non-Viable tissue/material. Material removed includes Eschar, Subcutaneous Tissue, and Slough after achieving pain control using Other (Benzocaine 20%). No specimens were taken. Gregory time out was conducted at 13:19, prior to the start of the procedure. Gregory Minimum amount of bleeding was controlled with Pressure. The procedure was tolerated well with Gregory pain level of 0 throughout and Gregory pain level of 0 following the procedure. Post Debridement Measurements: 0.4cm length x 0.6cm width x 0.1cm depth; 0.019cm^3 volume. Post debridement Stage noted as Category/Stage II. Character of Wound/Ulcer Post Debridement is improved. Post procedure Diagnosis Wound #5: Same as Pre-Procedure Plan Follow-up Appointments: Return Appointment in 1 week. - Dr. Celine Ahr Room 3 Bathing/ Shower/ Hygiene: May shower and wash wound with soap and water. Edema Control - Lymphedema / SCD / Other: Elevate legs to the level of the heart or above for 30 minutes daily and/or when sitting, Gregory frequency of: - throughout the day Avoid standing for long periods of time. Non Wound Condition: Protect area with: - Right and Left heels protect with Heel Cups Home Health: No change in wound care orders this week; continue Home Health for wound care. May utilize formulary  equivalent dressing for wound treatment orders unless otherwise specified. Other Home Health Orders/Instructions: - [facility] Consults ordered were: Podiatry - Please evaluate T Nails for nails to be trimmed. oe The following medication(s) was prescribed: gentamicin topical 0.1 % ointment apply to right lateral foot wound with dressing changes starting 07/21/2021 WOUND #1: - T Great Wound Laterality: Right oe Cleanser: Soap and Water 1 x Per Day/30 Days Discharge Instructions: May shower and wash wound with dial antibacterial soap and water prior to dressing change. Cleanser: Wound Cleanser 1 x Per Day/30 Days Discharge Instructions: Cleanse the wound with wound cleanser or normal saline prior to applying Gregory clean dressing using gauze sponges, not tissue or cotton balls. Prim Dressing: BETADINE (Home Health) (Generic) 1 x Per Day/30 Days ary Discharge Instructions: paint right great toe Secondary Dressing: Woven Gauze Sponges 2x2 in ([place]) 1 x Per Day/30 Days Discharge Instructions: Apply over primary dressing as directed. Secured With: Child psychotherapist, Sterile 2x75 (in/in) (Home Health) 1 x Per Day/30 Days Discharge Instructions: Secure with stretch gauze as directed. WOUND #2: - T Great Wound Laterality: Left oe Cleanser: Soap and Water 1 x Per Day/30 Days Discharge Instructions: May shower and wash wound with dial antibacterial soap and water prior to dressing change. Cleanser: Wound Cleanser 1 x Per Day/30 Days Discharge Instructions: Cleanse the wound with wound cleanser or normal saline prior to applying Gregory clean dressing using gauze sponges, not tissue or cotton balls. Prim Dressing: Iodosorb Gel 10 (gm) Tube (Home Health) (Generic) 1 x Per Day/30 Days ary Discharge Instructions: Apply to wound bed as instructed Secondary Dressing: Woven Gauze Sponges 2x2 in ([place]) 1 x Per Day/30 Days Discharge Instructions: Apply over primary dressing as  directed. Secured With: Child psychotherapist, Sterile 2x75 (in/in) (Home Health) 1 x Per Day/30 Days Discharge Instructions: Secure with stretch gauze as directed. WOUND #4: - Foot Wound Laterality: Right, Lateral Cleanser: Soap and Water (Home Health) 1 x Per Day/30 Days Discharge Instructions: May shower and wash wound with dial antibacterial soap and water prior to dressing change. Topical: Gentamicin 1 x Per Day/30 Days Discharge Instructions: As directed by  physician Prim Dressing: Paulina Fusi Ag Gelling Fiber Dressing, 2x2 in (silver alginate) 1 x Per Day/30 Days ary Discharge Instructions: Apply silver alginate to wound bed as instructed Prim Dressing: 1 x Per Day/30 Days ary Secondary Dressing: ALLEVYN Gentle Border, 3x3 (in/in) 1 x Per Day/30 Days Discharge Instructions: Apply over primary dressing as directed. WOUND #5: - T Great Wound Laterality: Right, Medial oe Cleanser: Soap and Water (Home Health) 1 x Per Day/30 Days Discharge Instructions: May shower and wash wound with dial antibacterial soap and water prior to dressing change. Prim Dressing: BETADINE 1 x Per Day/30 Days ary Discharge Instructions: Paint right great toe Secondary Dressing: ALLEVYN Gentle Border, 3x3 (in/in) 1 x Per Day/30 Days Discharge Instructions: Apply over primary dressing as directed. 07/21/2021: The heels remain closed. The right lateral foot wound is about the same size as last week, but he is complaining of more pain in the area and there is some perimeter erythema. The right great toe is looking much better today; the Betadine paint continues to keep the areas of concern dry. The left great toe is doing well with Gregory nice wound surface underneath the crusted-on Iodosorb. I debrided all of the sites of dried skin, slough, eschar, and nonviable subcutaneous tissue using Gregory curette. Given his increased pain in the right lateral foot wound, I am going to add topical gentamicin to his wound care  regimen. This was prescribed and sent to his pharmacy electronically. We Gregory continue with our current management of Betadine paint to the right toe, Iodosorb to the left toe, and Prisma silver collagen and gentamicin to the right lateral foot. His daughter also asked about toenail care. We Gregory place Gregory referral to podiatry for nail debridement. Follow-up here in 1 week. Electronic Signature(s) Signed: 07/21/2021 1:43:24 PM By: Fredirick Maudlin MD FACS Entered By: Fredirick Maudlin on 07/21/2021 13:43:24 -------------------------------------------------------------------------------- HxROS Details Patient Name: Date of Service: Gregory [name], Gregory [name] LD 07/21/2021 12:30 PM Medical Record Number: [id number] Patient Account Number: [id number] Date of Birth/Sex: Treating RN: [birth year] (88 y.o. [name] Primary Care Provider: Wylene Simmer Other Clinician: Referring Provider: Treating Provider/Extender: Roxy Cedar in Treatment: 11 Information Obtained From Patient Caregiver Cardiovascular Medical History: Positive for: Arrhythmia - Gregory-Fib; Hypertension Past Medical History Notes: Pacemaker, Complete heart block Gastrointestinal Medical History: Past Medical History Notes: Dysphagia, PEG tube in place Endocrine Medical History: Past Medical History Notes: Hypothyroidism Genitourinary Medical History: Past Medical History Notes: CKD-3 Neurologic Medical History: Past Medical History Notes: Neuromuscular disorder Immunizations Pneumococcal Vaccine: Received Pneumococcal Vaccination: Yes Received Pneumococcal Vaccination On or After 60th Birthday: No Implantable Devices Yes Family and Social History Unknown History: Yes; Never smoker; Marital Status - Widowed; Alcohol Use: Never; Drug Use: No History; Caffeine Use: Never; Financial Concerns: No; Food, Clothing or Shelter Needs: No; Support System Lacking: No; Transportation Concerns:  No Electronic Signature(s) Signed: 07/21/2021 3:49:59 PM By: Fredirick Maudlin MD FACS Signed: 07/21/2021 5:26:33 PM By: Dellie Catholic RN Entered By: Fredirick Maudlin on 07/21/2021 13:40:16 -------------------------------------------------------------------------------- SuperBill Details Patient Name: Date of Service: Gregory [name], Gregory [name] LD 07/21/2021 Medical Record Number: [id number] Patient Account Number: [id number] Date of Birth/Sex: Treating RN: November 24, 1920 (87 y.o. [name] Primary Care Provider: Wylene Simmer Other Clinician: Referring Provider: Treating Provider/Extender: Roxy Cedar in Treatment: 11 Diagnosis Coding ICD-10 Codes Code Description [phone] Atherosclerosis of native arteries of extremities with gangrene, bilateral legs I73.9 Peripheral vascular disease, unspecified E43 Unspecified severe protein-calorie malnutrition N18.31 Chronic kidney disease,  stage 3a R13.10 Dysphagia, unspecified L97.512 Non-pressure chronic ulcer of other part of right foot with fat layer exposed L97.522 Non-pressure chronic ulcer of other part of left foot with fat layer exposed L89.612 Pressure ulcer of right heel, stage 2 Facility Procedures CPT4 Code: [id number] Description: 74142 - DEB SUBQ TISSUE 20 SQ CM/< ICD-10 Diagnosis Description L97.512 Non-pressure chronic ulcer of other part of right foot with fat layer exposed L97.522 Non-pressure chronic ulcer of other part of left foot with fat layer exposed Modifier: Quantity: 1 Physician Procedures : CPT4 Code Description Modifier [id number] - WC PHYS LEVEL 4 - EST PT 25 ICD-10 Diagnosis Description L97.512 Non-pressure chronic ulcer of other part of right foot with fat layer exposed L97.522 Non-pressure chronic ulcer of other part of left foot  with fat layer exposed I70.263 Atherosclerosis of native arteries of extremities with gangrene, bilateral legs I73.9 Peripheral vascular disease,  unspecified Quantity: 1 : [id number] 11042 - WC PHYS SUBQ TISS 20 SQ CM ICD-10 Diagnosis Description L97.512 Non-pressure chronic ulcer of other part of right foot with fat layer exposed L97.522 Non-pressure chronic ulcer of other part of left foot with fat layer exposed Quantity: 1 Electronic Signature(s) Signed: 07/21/2021 1:43:51 PM By: Fredirick Maudlin MD FACS Entered By: Fredirick Maudlin on 07/21/2021 13:43:51

## 2021-07-21 NOTE — Progress Notes (Signed)
Gregory, [name] ([id number]) Visit Report for 07/21/2021 Arrival Information Details Patient Name: Date of Service: Gregory [name] [facility] LD 07/21/2021 12:30 PM Medical Record Number: [id number] Patient Account Number: [id number] Date of Birth/Sex: Treating RN: 1920-04-19 (88 y.o. Gregory [name] Primary Care Gregory [name]: Gregory [name] Other Clinician: Referring Gregory [name]: Treating Gregory [name]: Gregory [name]: 11 Visit Information History Since Last Visit Added or deleted any medications: No Patient Arrived: Wheel Chair Any new allergies or adverse reactions: No Arrival Time: 12:45 Had Gregory fall or experienced change in No Accompanied By: daughter activities of daily living that may affect Transfer Assistance: Manual risk of falls: Patient Identification Verified: Yes Signs or symptoms of abuse/neglect since last visito No Patient Requires Transmission-Based Precautions: No Hospitalized since last visit: No Patient Has Alerts: Yes Implantable device outside of the clinic excluding No Patient Alerts: R ABI: Saxis TBI: 0.18 cellular tissue based products placed in the center L ABI:  TBI: 0.0 since last visit: Has Dressing in Place as Prescribed: Yes Pain Present Now: No Electronic Signature(s) Signed: 07/21/2021 5:26:33 PM By: Gregory Catholic RN Entered By: Gregory [name] on 07/21/2021 12:51:11 -------------------------------------------------------------------------------- Encounter Discharge Information Details Patient Name: Date of Service: Gregory [name], Gregory [name] LD 07/21/2021 12:30 PM Medical Record Number: [id number] Patient Account Number: [id number] Date of Birth/Sex: Treating RN: 09-07-1920 (88 y.o. Gregory [name] Primary Care Gregory [name]: Gregory [name] Other Clinician: Referring Anadia Helmes: Treating Gregory [name]/Extender: Gregory [name]: 11 Encounter Discharge Information Items Post Procedure  Vitals Discharge Condition: Stable Temperature (F): 98.2 Ambulatory Status: Wheelchair Pulse (bpm): 84 Discharge Destination: Home Respiratory Rate (breaths/min): 18 Transportation: Private Auto Blood Pressure (mmHg): 118/69 Accompanied By: daughter Schedule Follow-up Appointment: Yes Clinical Summary of Care: Patient Declined Electronic Signature(s) Signed: 07/21/2021 5:26:33 PM By: Gregory Catholic RN Entered By: Gregory [name] on 07/21/2021 17:25:05 -------------------------------------------------------------------------------- Lower Extremity Assessment Details Patient Name: Date of Service: Gregory [name] [name] LD 07/21/2021 12:30 PM Medical Record Number: [id number] Patient Account Number: [id number] Date of Birth/Sex: Treating RN: 04-Aug-1920 (88 y.o. Gregory [name] Primary Care Gregory [name]: Gregory [name] Other Clinician: Referring Gregory [name]: Treating Gregory [name]/Extender: Gregory [name]: 11 Edema Assessment Assessed: [Left: No] [Right: No] E[Left: dema] [Right: :] Calf Left: Right: Point of Measurement: From Medial Instep 29 cm 28.8 cm Ankle Left: Right: Point of Measurement: From Medial Instep 19 cm 18.7 cm Electronic Signature(s) Signed: 07/21/2021 5:26:33 PM By: Gregory Catholic RN Entered By: Gregory [name] on 07/21/2021 12:51:55 -------------------------------------------------------------------------------- Multi Wound Chart Details Patient Name: Date of Service: Gregory [name], Gregory [name] LD 07/21/2021 12:30 PM Medical Record Number: [id number] Patient Account Number: [id number] Date of Birth/Sex: Treating RN: Dec 10, 1920 (88 y.o. Gregory [name] Primary Care Gregory [name]: Gregory [name] Other Clinician: Referring Gregory [name]: Treating Gregory [name]/Extender: Gregory [name]: 11 Vital Signs Height(in): Pulse(bpm): 46 Weight(lbs): Blood Pressure(mmHg): 118/69 Body Mass Index(BMI): Temperature(F):  98.2 Respiratory Rate(breaths/min): 18 Photos: Right T Great oe Left T Great oe Right, Lateral Foot Wound Location: Pressure Injury Pressure Injury Gradually Appeared Wounding Event: Arterial Insufficiency Ulcer Arterial Insufficiency Ulcer Pressure Ulcer Primary Etiology: Arrhythmia, Hypertension Arrhythmia, Hypertension Arrhythmia, Hypertension Comorbid History: 02/12/2021 02/12/2021 04/12/2021 Date Acquired: '11 11 6 '$ Weeks of [name]: Open Open Open Wound Status: No No No Wound Recurrence: 0.9x0.8x0.1 0.9x1.1x0.1 0.3x0.3x0.1 Measurements L x W x D (cm) 0.565 0.778 0.071 Gregory (cm) : rea 0.057 0.078 0.007 Volume (cm) : 88.00% 80.20% -51.10% % Reduction in Area: 87.90% 80.20% -40.00% % Reduction in Volume: Full  Thickness Without Exposed Unclassifiable Category/Stage II Classification: Support Structures Medium Small Small Exudate Gregory mount: Serosanguineous Serosanguineous Serosanguineous Exudate Type: red, brown red, brown red, brown Exudate Color: Flat and Intact Flat and Intact Flat and Intact Wound Margin: Small (1-33%) None Present (0%) Small (1-33%) Granulation Gregory mount: Red N/Gregory Red Granulation Quality: Large (67-100%) Large (67-100%) Large (67-100%) Necrotic Gregory mount: Eschar Eschar, Adherent Slough Eschar Necrotic Tissue: Fat Layer (Subcutaneous Tissue): Yes Fat Layer (Subcutaneous Tissue): Yes Fat Layer (Subcutaneous Tissue): Yes Exposed Structures: Fascia: No Fascia: No Fascia: No Tendon: No Tendon: No Tendon: No Muscle: No Muscle: No Muscle: No Joint: No Joint: No Joint: No Bone: No Bone: No Bone: No Large (67-100%) None Small (1-33%) Epithelialization: Debridement - Excisional Debridement - Excisional Debridement - Excisional Debridement: Pre-procedure Verification/Time Out 13:19 13:19 13:19 Taken: Other Other Other Pain Control: Necrotic/Eschar, Subcutaneous, Necrotic/Eschar, Subcutaneous, Necrotic/Eschar, Subcutaneous, Tissue  Debrided: [employer] Skin/Subcutaneous Tissue Skin/Subcutaneous Tissue Skin/Subcutaneous Tissue Level: 0.72 0.99 0.09 Debridement Gregory (sq cm): rea Curette Curette Curette Instrument: Minimum Minimum Minimum Bleeding: Pressure Pressure Pressure Hemostasis Achieved: 0 0 0 Procedural Pain: 0 0 0 Post Procedural Pain: Debridement [name] Response: Procedure was tolerated well Procedure was tolerated well Procedure was tolerated well Post Debridement Measurements L x 0.9x0.8x0.1 0.9x1.1x0.1 0.3x0.3x0.1 W x D (cm) 0.057 0.078 0.007 Post Debridement Volume: (cm) N/Gregory N/Gregory Category/Stage II Post Debridement Stage: Debridement Debridement Debridement Procedures Performed: Wound Number: 5 N/Gregory N/Gregory Photos: N/Gregory N/Gregory Right, Medial T Great oe N/Gregory N/Gregory Wound Location: Blister N/Gregory N/Gregory Wounding Event: Pressure Ulcer N/Gregory N/Gregory Primary Etiology: Arrhythmia, Hypertension N/Gregory N/Gregory Comorbid History: 06/12/2021 N/Gregory N/Gregory Date Acquired: 4 N/Gregory N/Gregory Weeks of [name]: Open N/Gregory N/Gregory Wound Status: No N/Gregory N/Gregory Wound Recurrence: 0.4x0.6x0.1 N/Gregory N/Gregory Measurements L x W x D (cm) 0.188 N/Gregory N/Gregory Gregory (cm) : rea 0.019 N/Gregory N/Gregory Volume (cm) : -164.80% N/Gregory N/Gregory % Reduction in Gregory rea: -171.40% N/Gregory N/Gregory % Reduction in Volume: Category/Stage II N/Gregory N/Gregory Classification: Medium N/Gregory N/Gregory Exudate Gregory mount: Serosanguineous N/Gregory N/Gregory Exudate Type: red, brown N/Gregory N/Gregory Exudate Color: N/Gregory N/Gregory N/Gregory Wound Margin: Small (1-33%) N/Gregory N/Gregory Granulation Gregory mount: Red N/Gregory N/Gregory Granulation Quality: Large (67-100%) N/Gregory N/Gregory Necrotic Gregory mount: Eschar, Adherent Slough N/Gregory N/Gregory Necrotic Tissue: Fat Layer (Subcutaneous Tissue): Yes N/Gregory N/Gregory Exposed Structures: Fascia: No Tendon: No Muscle: No Joint: No Bone: No None N/Gregory N/Gregory Epithelialization: Debridement - Excisional N/Gregory N/Gregory Debridement: Pre-procedure Verification/Time Out 13:19 N/Gregory N/Gregory Taken: Other N/Gregory N/Gregory Pain Control: Necrotic/Eschar, Subcutaneous, N/Gregory  N/Gregory Tissue Debrided: Slough Skin/Subcutaneous Tissue N/Gregory N/Gregory Level: 0.24 N/Gregory N/Gregory Debridement Gregory (sq cm): rea Curette N/Gregory N/Gregory Instrument: Minimum N/Gregory N/Gregory Bleeding: Pressure N/Gregory N/Gregory Hemostasis Achieved: 0 N/Gregory N/Gregory Procedural Pain: 0 N/Gregory N/Gregory Post Procedural Pain: Procedure was tolerated well N/Gregory N/Gregory Debridement [name] Response: 0.4x0.6x0.1 N/Gregory N/Gregory Post Debridement Measurements L x W x D (cm) 0.019 N/Gregory N/Gregory Post Debridement Volume: (cm) Category/Stage II N/Gregory N/Gregory Post Debridement Stage: Debridement N/Gregory N/Gregory Procedures Performed: [name] Notes Electronic Signature(s) Signed: 07/21/2021 1:38:51 PM By: Fredirick Maudlin MD FACS Signed: 07/21/2021 5:26:33 PM By: Gregory Catholic RN Entered By: Fredirick Maudlin on 07/21/2021 13:38:50 -------------------------------------------------------------------------------- Multi-Disciplinary Care Plan Details Patient Name: Date of Service: Gregory [name], Gregory [name] LD 07/21/2021 12:30 PM Medical Record Number: [id number] Patient Account Number: [id number] Date of Birth/Sex: Treating RN: Jun 05, 1920 (88 y.o. Gregory [name] Primary Care Yvaine Jankowiak: Gregory [name] Other Clinician: Referring Jerred Zaremba: Treating Sharnee Douglass/Extender: Gregory [name]: 11 Multidisciplinary Care  Plan reviewed with physician Active Inactive Abuse / Safety / Falls / Self Care Management Nursing Diagnoses: Potential for falls Potential for injury related to falls Goals: Patient Gregory not experience any injury related to falls Date Initiated: 05/05/2021 Target Resolution Date: 08/11/2021 Goal Status: Active Patient/caregiver Gregory verbalize/demonstrate measures taken to prevent injury and/or falls Date Initiated: 05/05/2021 Target Resolution Date: 08/11/2021 Goal Status: Active Interventions: Assess Activities of Daily Living upon admission and as needed Assess fall risk on admission and as needed Assess: immobility, friction,  shearing, incontinence upon admission and as needed Assess impairment of mobility on admission and as needed per policy Assess personal safety and home safety (as indicated) on admission and as needed Assess self care needs on admission and as needed Provide education on fall prevention Provide education on personal and home safety Notes: Nutrition Nursing Diagnoses: Potential for alteratiion in Nutrition/Potential for imbalanced nutrition Goals: Patient/caregiver agrees to and verbalizes understanding of need to use nutritional supplements and/or vitamins as prescribed Date Initiated: 05/05/2021 Target Resolution Date: 08/11/2021 Goal Status: Active Interventions: Assess HgA1c results as ordered upon admission and as needed Assess patient nutrition upon admission and as needed per policy Provide education on elevated blood sugars and impact on wound healing Provide education on nutrition [name] Activities: Education provided on Nutrition : 05/05/2021 Notes: Wound/Skin Impairment Nursing Diagnoses: Impaired tissue integrity Knowledge deficit related to ulceration/compromised skin integrity Goals: Patient/caregiver Gregory verbalize understanding of skin care regimen Date Initiated: 05/05/2021 Target Resolution Date: 08/11/2021 Goal Status: Active Interventions: Assess patient/caregiver ability to obtain necessary supplies Assess patient/caregiver ability to perform ulcer/skin care regimen upon admission and as needed Assess ulceration(s) every visit Provide education on ulcer and skin care Notes: Electronic Signature(s) Signed: 07/21/2021 5:26:33 PM By: Gregory Catholic RN Entered By: Gregory [name] on 07/21/2021 13:20:30 -------------------------------------------------------------------------------- Pain Assessment Details Patient Name: Date of Service: Gregory [name] [name] LD 07/21/2021 12:30 PM Medical Record Number: [id number] Patient Account Number: [id number] Date of  Birth/Sex: Treating RN: July 22, 1920 (88 y.o. Gregory [name] Primary Care Jamella Grayer: Gregory [name] Other Clinician: Referring Benjy Kana: Treating Tomekia Helton/Extender: Gregory [name]: 11 Active Problems Location of Pain Severity and Description of Pain Patient Has [name] No Site Locations Pain Management and Medication Current Pain Management: Electronic Signature(s) Signed: 07/21/2021 5:26:33 PM By: Gregory Catholic RN Entered By: Gregory [name] on 07/21/2021 12:51:48 -------------------------------------------------------------------------------- Patient/Caregiver Education Details Patient Name: Date of Service: Gregory [name] LD 6/9/2023andnbsp12:30 PM Medical Record Number: [id number] Patient Account Number: [id number] Date of Birth/Gender: Treating RN: 08-Sep-1920 (88 y.o. Gregory [name] Primary Care Physician: Gregory [name] Other Clinician: Referring Physician: Treating Physician/Extender: Gregory [name]: 11 Education Assessment Education Provided To: Patient Education Topics Provided Wound/Skin Impairment: Methods: Explain/Verbal Responses: Return demonstration correctly Electronic Signature(s) Signed: 07/21/2021 5:26:33 PM By: Gregory Catholic RN Entered By: Gregory [name] on 07/21/2021 17:23:43 -------------------------------------------------------------------------------- Wound Assessment Details Patient Name: Date of Service: Gregory [name], Gregory [name] LD 07/21/2021 12:30 PM Medical Record Number: [id number] Patient Account Number: [id number] Date of Birth/Sex: Treating RN: August 02, 1920 (88 y.o. Gregory [name] Primary Care Naseem Varden: Gregory [name] Other Clinician: Referring Sarabelle Genson: Treating Alejandra Barna/Extender: Gregory [name]: 11 Wound Status Wound Number: 1 Primary Etiology: Arterial Insufficiency Ulcer Wound Location: Right T Great oe  Wound Status: Open Wounding Event: Pressure Injury Comorbid History: Arrhythmia, Hypertension Date Acquired: 02/12/2021 Weeks Of [name]: 11 Clustered Wound: No Photos Wound Measurements Length: (cm) 0.9 Width: (cm) 0.8 Depth: (cm) 0.1 Area: (cm) 0.565  Volume: (cm) 0.057 % Reduction in Area: 88% % Reduction in Volume: 87.9% Epithelialization: Large (67-100%) Tunneling: No Undermining: No Wound Description Classification: Full Thickness Without Exposed Support Structures Wound Margin: Flat and Intact Exudate Amount: Medium Exudate Type: Serosanguineous Exudate Color: red, brown Foul Odor After Cleansing: No Slough/Fibrino Yes Wound Bed Granulation Amount: Small (1-33%) Exposed Structure Granulation Quality: Red Fascia Exposed: No Necrotic Amount: Large (67-100%) Fat Layer (Subcutaneous Tissue) Exposed: Yes Necrotic Quality: Eschar Tendon Exposed: No Muscle Exposed: No Joint Exposed: No Bone Exposed: No [name] Notes Wound #1 (Toe Great) Wound Laterality: Right Cleanser Soap and Water Discharge Instruction: May shower and wash wound with dial antibacterial soap and water prior to dressing change. Wound Cleanser Discharge Instruction: Cleanse the wound with wound cleanser or normal saline prior to applying Gregory clean dressing using gauze sponges, not tissue or cotton balls. Peri-Wound Care Topical Primary Dressing BETADINE Discharge Instruction: paint right great toe Secondary Dressing Woven Gauze Sponges 2x2 in Discharge Instruction: Apply over primary dressing as directed. Secured With Conforming Stretch Gauze Bandage, Sterile 2x75 (in/in) Discharge Instruction: Secure with stretch gauze as directed. Compression Wrap Compression Stockings Add-Ons Electronic Signature(s) Signed: 07/21/2021 5:26:33 PM By: Gregory Catholic RN Entered By: Gregory [name] on 07/21/2021  13:11:18 -------------------------------------------------------------------------------- Wound Assessment Details Patient Name: Date of Service: Gregory [name] [name] LD 07/21/2021 12:30 PM Medical Record Number: [id number] Patient Account Number: [id number] Date of Birth/Sex: Treating RN: 08-08-1920 (88 y.o. Gregory [name] Primary Care Porsche Noguchi: Gregory [name] Other Clinician: Referring Queena Monrreal: Treating Avri Paiva/Extender: Gregory [name]: 11 Wound Status Wound Number: 2 Primary Etiology: Arterial Insufficiency Ulcer Wound Location: Left T Great oe Wound Status: Open Wounding Event: Pressure Injury Comorbid History: Arrhythmia, Hypertension Date Acquired: 02/12/2021 Weeks Of [name]: 11 Clustered Wound: No Photos Wound Measurements Length: (cm) 0.9 Width: (cm) 1.1 Depth: (cm) 0.1 Area: (cm) 0.778 Volume: (cm) 0.078 % Reduction in Area: 80.2% % Reduction in Volume: 80.2% Epithelialization: None Tunneling: No Undermining: No Wound Description Classification: Unclassifiable Wound Margin: Flat and Intact Exudate Amount: Small Exudate Type: Serosanguineous Exudate Color: red, brown Foul Odor After Cleansing: No Slough/Fibrino Yes Wound Bed Granulation Amount: None Present (0%) Exposed Structure Necrotic Amount: Large (67-100%) Fascia Exposed: No Necrotic Quality: Eschar, Adherent Slough Fat Layer (Subcutaneous Tissue) Exposed: Yes Tendon Exposed: No Muscle Exposed: No Joint Exposed: No Bone Exposed: No [name] Notes Wound #2 (Toe Great) Wound Laterality: Left Cleanser Soap and Water Discharge Instruction: May shower and wash wound with dial antibacterial soap and water prior to dressing change. Wound Cleanser Discharge Instruction: Cleanse the wound with wound cleanser or normal saline prior to applying Gregory clean dressing using gauze sponges, not tissue or cotton balls. Peri-Wound Care Topical Primary  Dressing Iodosorb Gel 10 (gm) Tube Discharge Instruction: Apply to wound bed as instructed Secondary Dressing Woven Gauze Sponges 2x2 in Discharge Instruction: Apply over primary dressing as directed. Secured With Conforming Stretch Gauze Bandage, Sterile 2x75 (in/in) Discharge Instruction: Secure with stretch gauze as directed. Compression Wrap Compression Stockings Add-Ons Electronic Signature(s) Signed: 07/21/2021 5:26:33 PM By: Gregory Catholic RN Entered By: Gregory [name] on 07/21/2021 13:12:17 -------------------------------------------------------------------------------- Wound Assessment Details Patient Name: Date of Service: Gregory [name] [name] LD 07/21/2021 12:30 PM Medical Record Number: [id number] Patient Account Number: [id number] Date of Birth/Sex: Treating RN: March 12, 1920 (88 y.o. Gregory [name] Primary Care Mikenzie Mccannon: Gregory [name] Other Clinician: Referring Denman Pichardo: Treating Savian Mazon/Extender: Gregory [name]: 11 Wound Status Wound Number: 4 Primary Etiology: Pressure Ulcer Wound  Location: Right, Lateral Foot Wound Status: Open Wounding Event: Gradually Appeared Comorbid History: Arrhythmia, Hypertension Date Acquired: 04/12/2021 Weeks Of [name]: 6 Clustered Wound: No Photos Wound Measurements Length: (cm) 0.3 Width: (cm) 0.3 Depth: (cm) 0.1 Area: (cm) 0.071 Volume: (cm) 0.007 % Reduction in Area: -51.1% % Reduction in Volume: -40% Epithelialization: Small (1-33%) Tunneling: No Undermining: No Wound Description Classification: Category/Stage II Wound Margin: Flat and Intact Exudate Amount: Small Exudate Type: Serosanguineous Exudate Color: red, brown Foul Odor After Cleansing: No Slough/Fibrino Yes Wound Bed Granulation Amount: Small (1-33%) Exposed Structure Granulation Quality: Red Fascia Exposed: No Necrotic Amount: Large (67-100%) Fat Layer (Subcutaneous Tissue) Exposed: Yes Necrotic Quality:  Eschar Tendon Exposed: No Muscle Exposed: No Joint Exposed: No Bone Exposed: No [name] Notes Wound #4 (Foot) Wound Laterality: Right, Lateral Cleanser Soap and Water Discharge Instruction: May shower and wash wound with dial antibacterial soap and water prior to dressing change. Peri-Wound Care Topical Gentamicin Discharge Instruction: As directed by physician Primary Dressing KerraCel Ag Gelling Fiber Dressing, 2x2 in (silver alginate) Discharge Instruction: Apply silver alginate to wound bed as instructed Secondary Dressing ALLEVYN Gentle Border, 3x3 (in/in) Discharge Instruction: Apply over primary dressing as directed. Secured With Compression Wrap Compression Stockings Environmental education officer) Signed: 07/21/2021 5:26:33 PM By: Gregory Catholic RN Entered By: Gregory [name] on 07/21/2021 13:13:09 -------------------------------------------------------------------------------- Wound Assessment Details Patient Name: Date of Service: Gregory [name] [name] LD 07/21/2021 12:30 PM Medical Record Number: [id number] Patient Account Number: [id number] Date of Birth/Sex: Treating RN: 13-Dec-1920 (88 y.o. Gregory [name] Primary Care Rue Tinnel: Gregory [name] Other Clinician: Referring Claude Swendsen: Treating Teruo Stilley/Extender: Gregory [name]: 11 Wound Status Wound Number: 5 Primary Etiology: Pressure Ulcer Wound Location: Right, Medial T Great oe Wound Status: Open Wounding Event: Blister Comorbid History: Arrhythmia, Hypertension Date Acquired: 06/12/2021 Weeks Of [name]: 4 Clustered Wound: No Photos Wound Measurements Length: (cm) 0.4 Width: (cm) 0.6 Depth: (cm) 0.1 Area: (cm) 0.188 Volume: (cm) 0.019 % Reduction in Area: -164.8% % Reduction in Volume: -171.4% Epithelialization: None Tunneling: No Undermining: No Wound Description Classification: Category/Stage II Exudate Amount: Medium Exudate Type:  Serosanguineous Exudate Color: red, brown Foul Odor After Cleansing: No Slough/Fibrino Yes Wound Bed Granulation Amount: Small (1-33%) Exposed Structure Granulation Quality: Red Fascia Exposed: No Necrotic Amount: Large (67-100%) Fat Layer (Subcutaneous Tissue) Exposed: Yes Necrotic Quality: Eschar, Adherent Slough Tendon Exposed: No Muscle Exposed: No Joint Exposed: No Bone Exposed: No [name] Notes Wound #5 (Toe Great) Wound Laterality: Right, Medial Cleanser Soap and Water Discharge Instruction: May shower and wash wound with dial antibacterial soap and water prior to dressing change. Peri-Wound Care Topical Primary Dressing BETADINE Discharge Instruction: Paint right great toe Secondary Dressing ALLEVYN Gentle Border, 3x3 (in/in) Discharge Instruction: Apply over primary dressing as directed. Secured With Compression Wrap Compression Stockings Environmental education officer) Signed: 07/21/2021 5:26:33 PM By: Gregory Catholic RN Entered By: Gregory [name] on 07/21/2021 13:10:46 -------------------------------------------------------------------------------- Vitals Details Patient Name: Date of Service: Gregory [name], Gregory [name] LD 07/21/2021 12:30 PM Medical Record Number: [id number] Patient Account Number: [id number] Date of Birth/Sex: Treating RN: Aug 19, 1920 (88 y.o. Gregory [name] Primary Care Braydee Shimkus: Gregory [name] Other Clinician: Referring Gerome Kokesh: Treating Raysean Graumann/Extender: Gregory [name]: 11 Vital Signs Time Taken: 12:47 Temperature (F): 98.2 Pulse (bpm): 84 Respiratory Rate (breaths/min): 18 Blood Pressure (mmHg): 118/69 Reference Range: 80 - 120 mg / dl Electronic Signature(s) Signed: 07/21/2021 5:26:33 PM By: Gregory Catholic RN Entered By: Gregory [name] on 07/21/2021 12:51:41

## 2021-07-28 ENCOUNTER — Encounter (HOSPITAL_BASED_OUTPATIENT_CLINIC_OR_DEPARTMENT_OTHER): Payer: Medicare Other | Admitting: General Surgery

## 2021-07-28 DIAGNOSIS — L97512 Non-pressure chronic ulcer of other part of right foot with fat layer exposed: Secondary | ICD-10-CM | POA: Diagnosis not present

## 2021-07-28 NOTE — Progress Notes (Signed)
Gregory [name] ([id number]) Visit Report for 07/28/2021 Chief Complaint Document Details Patient Name: Date of Service: Gregory [name] RNO LD 07/28/2021 12:30 PM Medical Record Number: [id number] Patient Account Number: [id number] Date of Birth/Sex: Treating RN: May 23, 1920 (88 y.o. [name] Primary Care Provider: Wylene Simmer Other Clinician: Referring Provider: Treating Provider/Extender: Roxy Cedar in Treatment: 12 Information Obtained from: Patient Chief Complaint Patient is at the clinic for treatment of an open pressure ulcer on his left heel and dry gangrene of both great toes Electronic Signature(s) Signed: 07/28/2021 1:50:22 PM By: Fredirick Maudlin MD FACS Entered By: Fredirick Maudlin on 07/28/2021 13:50:22 -------------------------------------------------------------------------------- Debridement Details Patient Name: Date of Service: Gregory [name], [name] 07/28/2021 12:30 PM Medical Record Number: [id number] Patient Account Number: [id number] Date of Birth/Sex: Treating RN: [birth year] (88 y.o. [name] Primary Care Provider: Wylene Simmer Other Clinician: Referring Provider: Treating Provider/Extender: Roxy Cedar in Treatment: 12 Debridement Performed for Assessment: Wound #4 Right,Lateral Foot Performed By: Physician Fredirick Maudlin, MD Debridement Type: Debridement Level of Consciousness (Pre-procedure): Awake and Alert Pre-procedure Verification/Time Out Yes - 13:30 Taken: Start Time: 13:30 Pain Control: Other : Benzocaine 20% T Area Debrided (L x W): otal 0.1 (cm) x 0.1 (cm) = 0.01 (cm) Tissue and other material debrided: Non-Viable, Eschar, [place] Level: Non-Viable Tissue Debridement Description: Selective/Open Wound Instrument: Curette Bleeding: Minimum Hemostasis Achieved: Pressure End Time: 13:33 Procedural Pain: 0 Post Procedural Pain: 0 Response to Treatment:  Procedure was tolerated well Level of Consciousness (Post- Awake and Alert procedure): Post Debridement Measurements of Total Wound Length: (cm) 0.1 Stage: Category/Stage II Width: (cm) 0.1 Depth: (cm) 0.1 Volume: (cm) 0.001 Character of Wound/Ulcer Post Debridement: Improved Post Procedure Diagnosis Same as Pre-procedure Electronic Signature(s) Signed: 07/28/2021 4:03:57 PM By: Fredirick Maudlin MD FACS Signed: 07/28/2021 5:29:34 PM By: Dellie Catholic RN Entered By: Dellie Catholic on 07/28/2021 15:29:28 -------------------------------------------------------------------------------- Debridement Details Patient Name: Date of Service: Gregory [name], [name] 07/28/2021 12:30 PM Medical Record Number: [id number] Patient Account Number: [id number] Date of Birth/Sex: Treating RN: 08-23-1920 (88 y.o. [name] Primary Care Provider: Wylene Simmer Other Clinician: Referring Provider: Treating Provider/Extender: Roxy Cedar in Treatment: 12 Debridement Performed for Assessment: Wound #2 Left T Great oe Performed By: Physician Fredirick Maudlin, MD Debridement Type: Debridement Severity of Tissue Pre Debridement: Fat layer exposed Level of Consciousness (Pre-procedure): Awake and Alert Pre-procedure Verification/Time Out Yes - 13:30 Taken: Start Time: 13:30 Pain Control: Other : Benzocaine 20% T Area Debrided (L x W): otal 0.3 (cm) x 0.2 (cm) = 0.06 (cm) Tissue and other material debrided: Non-Viable, Callus, Eschar, Slough, Subcutaneous, Skin: Dermis , Slough Level: Skin/Subcutaneous Tissue Debridement Description: Excisional Instrument: Curette Bleeding: Minimum Hemostasis Achieved: Pressure End Time: 13:33 Procedural Pain: 0 Post Procedural Pain: 0 Response to Treatment: Procedure was tolerated well Level of Consciousness (Post- Awake and Alert procedure): Post Debridement Measurements of Total Wound Length: (cm) 0.3 Width: (cm)  0.2 Depth: (cm) 0.1 Volume: (cm) 0.005 Character of Wound/Ulcer Post Debridement: Improved Severity of Tissue Post Debridement: Fat layer exposed Post Procedure Diagnosis Same as Pre-procedure Electronic Signature(s) Signed: 07/28/2021 4:03:57 PM By: Fredirick Maudlin MD FACS Signed: 07/28/2021 5:29:34 PM By: Dellie Catholic RN Entered By: Dellie Catholic on 07/28/2021 15:30:16 -------------------------------------------------------------------------------- Debridement Details Patient Name: Date of Service: Gregory [name], [name] 07/28/2021 12:30 PM Medical Record Number: [id number] Patient Account Number: [id number] Date of Birth/Sex: Treating RN: 03-06-1920 (88 y.o. [name] Primary Care Provider:  Wylene Simmer Other Clinician: Referring Provider: Treating Provider/Extender: Roxy Cedar in Treatment: 12 Debridement Performed for Assessment: Wound #1 Right T Great oe Performed By: Physician Fredirick Maudlin, MD Debridement Type: Debridement Severity of Tissue Pre Debridement: Fat layer exposed Level of Consciousness (Pre-procedure): Awake and Alert Pre-procedure Verification/Time Out Yes - 13:30 Taken: Start Time: 13:30 Pain Control: Other : Benzocaine 20% T Area Debrided (L x W): otal 0.6 (cm) x 0.4 (cm) = 0.24 (cm) Tissue and other material debrided: Non-Viable, Callus, Eschar, Slough, Subcutaneous, Skin: Dermis , Slough Level: Skin/Subcutaneous Tissue Debridement Description: Excisional Instrument: Curette Bleeding: Minimum Hemostasis Achieved: Pressure End Time: 13:33 Procedural Pain: 0 Post Procedural Pain: 0 Response to Treatment: Procedure was tolerated well Level of Consciousness (Post- Awake and Alert procedure): Post Debridement Measurements of Total Wound Length: (cm) 0.6 Width: (cm) 0.4 Depth: (cm) 0.1 Volume: (cm) 0.019 Character of Wound/Ulcer Post Debridement: Improved Severity of Tissue Post Debridement:  Fat layer exposed Post Procedure Diagnosis Same as Pre-procedure Electronic Signature(s) Signed: 07/28/2021 4:03:57 PM By: Fredirick Maudlin MD FACS Signed: 07/28/2021 5:29:34 PM By: Dellie Catholic RN Previous Signature: 07/28/2021 2:17:12 PM Version By: Fredirick Maudlin MD FACS Entered By: Dellie Catholic on 07/28/2021 15:30:40 -------------------------------------------------------------------------------- Debridement Details Patient Name: Date of Service: Gregory [name], [name] 07/28/2021 12:30 PM Medical Record Number: [id number] Patient Account Number: [id number] Date of Birth/Sex: Treating RN: [birth year] (88 y.o. [name] Primary Care Provider: Wylene Simmer Other Clinician: Referring Provider: Treating Provider/Extender: Roxy Cedar in Treatment: 12 Debridement Performed for Assessment: Wound #5 Right,Medial T Great oe Performed By: Physician Fredirick Maudlin, MD Debridement Type: Debridement Level of Consciousness (Pre-procedure): Awake and Alert Pre-procedure Verification/Time Out Yes - 13:30 Taken: Start Time: 13:30 Pain Control: Other : Benzocaine 20% T Area Debrided (L x W): otal 0.9 (cm) x 0.5 (cm) = 0.45 (cm) Tissue and other material debrided: Non-Viable, Callus, Eschar, Slough, Subcutaneous, Skin: Dermis , Slough Level: Skin/Subcutaneous Tissue Debridement Description: Excisional Instrument: Curette Specimen: Tissue Culture Number of Specimens T aken: 1 Bleeding: Minimum Hemostasis Achieved: Pressure End Time: 13:33 Procedural Pain: 0 Post Procedural Pain: 0 Response to Treatment: Procedure was tolerated well Level of Consciousness (Post- Awake and Alert procedure): Post Debridement Measurements of Total Wound Length: (cm) 0.9 Stage: Category/Stage II Width: (cm) 0.5 Depth: (cm) 0.1 Volume: (cm) 0.035 Character of Wound/Ulcer Post Debridement: Improved Post Procedure Diagnosis Same as  Pre-procedure Electronic Signature(s) Signed: 07/28/2021 4:03:57 PM By: Fredirick Maudlin MD FACS Signed: 07/28/2021 5:29:34 PM By: Dellie Catholic RN Previous Signature: 07/28/2021 2:17:12 PM Version By: Fredirick Maudlin MD FACS Entered By: Dellie Catholic on 07/28/2021 15:32:27 -------------------------------------------------------------------------------- HPI Details Patient Name: Date of Service: Gregory [name], [name] 07/28/2021 12:30 PM Medical Record Number: [id number] Patient Account Number: [id number] Date of Birth/Sex: Treating RN: January 04, 1921 (88 y.o. [name] Primary Care Provider: Wylene Simmer Other Clinician: Referring Provider: Treating Provider/Extender: Roxy Cedar in Treatment: 12 History of Present Illness HPI Description: ADMISSION 05/05/2021 This is an extremely frail 88 year old man who presents to clinic today accompanied by his son for evaluation of a pressure ulcer on his left heel. He also has dry gangrene on the tips of his bilateral great toes and the lateral aspect of both feet. It sounds like after a hospitalization for a GI bleed, he had a blanket on his feet which ultimately resulted in a pressure injury. He has severe dysphagia and is dependent on G-tube feeding. The wound on his heel has  been present since January. He has 24-hour care and they have been applying Xeroform to the heel. He apparently has some sort of air boot that he is meant to wear in bed at night. He has severe peripheral arterial disease as evidenced by the ABIs obtained by Dr. Rachelle Hora and copied here: ABI Findings: +---------+------------------+-----+----------+--------+ Right Rt Pressure (mmHg)IndexWaveform Comment  +---------+------------------+-----+----------+--------+ Brachial 136     +---------+------------------+-----+----------+--------+ PTA >255 [place] monophasic  +---------+------------------+-----+----------+--------+ DP  >255 [place] monophasic  +---------+------------------+-----+----------+--------+ Great T oe24 0.18    +---------+------------------+-----+----------+--------+ +---------+------------------+-----+----------+-------+ Left Lt Pressure (mmHg)IndexWaveform Comment +---------+------------------+-----+----------+-------+ Brachial 136     +---------+------------------+-----+----------+-------+ PTA >255 [place] monophasic  +---------+------------------+-----+----------+-------+ DP >255 [place] monophasic  +---------+------------------+-----+----------+-------+ Great T oe0 0.00 Absent   +---------+------------------+-----+----------+-------+ +-------+-----------+-----------+------------+------------+ ABI/TBIT oday's ABIT oday's TBIPrevious ABIPrevious TBI +-------+-----------+-----------+------------+------------+ Right [place] 0.18    +-------+-----------+-----------+------------+------------+ Left [place] 0.00    +-------+-----------+-----------+------------+------------+ Summary: Right: Resting right ankle-brachial index indicates noncompressible right lower extremity arteries. The right toe-brachial index is abnormal. Left: Resting left ankle-brachial index indicates noncompressible left lower extremity arteries. Absent left great toe PPG waveform. He saw Dr. Rachelle Hora on March 16 and at that time, he was felt to be far too frail to undergo any sort of intervention including an aortogram. Certainly not a surgical candidate. According to the electronic medical record, Dr. Rachelle Hora is planning to see him back in a couple of weeks and Gregory determine if he has made any significant improvement in his overall status and might be suitable for an intervention. I read his note to suggest that the patient has had multiple complications from other procedures and that even an aortogram may be too risky. 05/12/2021: Rather remarkably, the eschar and dry gangrene on his  bilateral great toes and bilateral fifth metatarsals actually looks better. Some of the eschar on his toes is peeling away to reveal healing tissue underneath. This is also the case with his lateral foot bilaterally. The heel is clean and superficial, with minimal slough. 06/07/2021: Since our last visit, Mr. Schweitzer has been in the hospital quite a bit. While in the hospital, they were painting his lateral feet and great toes with Betadine. The wound on his heel is nearly closed with just a sliver of open skin. They have been floating his heels as directed and his home health nurse recently ordered a device to keep his blankets from rubbing on his toes. They have continued using the Betadine on his lateral feet and great toes and the Prisma silver collagen on his left heel. He does have a bit of callus buildup on his right Achilles tendon area suggesting there is some pressure being applied. 06/15/2021: The left heel wound is closed. There is some slough buildup in the right lateral foot wound. Both great toe wounds have a layer of thick dark eschar, but they appear to have some viable tissue underneath this. He is complaining of pain in his right heel; he had a wound there at one time. On inspection, there is no open skin at this site. 06/23/2021: The left heel remains closed. The right heel never has opened. Both great toe wounds continue to have a layer of thick dark eschar, but it is softer today. The right lateral foot wound also has eschar overlying the surface which has some accumulated slough. He also has areas on his great toes bilaterally that look like they have been rubbing on the slippers that he wears all day. 07/06/2021: The right heel has a new callus on it. It peeled off as  I was examining the site and there is a small open wound in that area. The right great toe looks worse today; there are more darkened areas of eschar and the toe itself feels a bit boggy. The right lateral foot is small  with just a small amount of slough. The left great toe is in reasonable condition with just a layer of fairly soft eschar at the tip. According to the patient's family, he has been insisting upon blankets being wrapped over his feet at night and he does not cooperate with heel flotation. He also required a 2 unit blood transfusion last week for fairly significant anemia. 07/13/2021: Both heels are closed. The right lateral foot wound is small, but about the same size as last week. There is some accumulated slough within the wound. The right great toe looks better today; it is no longer boggy and the Betadine paint has dried up the areas that were concerning to me. The left great toe looks to be responding nicely to the Iodosorb dressing. 07/21/2021: The heels remain closed. The right lateral foot wound is about the same size as last week, but he is complaining of more pain in the area and there is some perimeter erythema. The right great toe is looking much better today; the Betadine paint continues to keep the areas of concern dry. The left great toe is doing well with a nice wound surface underneath the crusted-on Iodosorb. 07/28/2021: The right lateral foot wound remains basically unchanged with some overlying eschar, underneath which there is a very small amount of slough. The right great toe is looking worse today. It is red and warm and swollen. There is what looks like a blood blister underneath the eschar on the medial aspect of the toe. The distal tip, however, does not look too bad. The left great toe wound is smaller. Electronic Signature(s) Signed: 07/28/2021 1:51:44 PM By: Fredirick Maudlin MD FACS Entered By: Fredirick Maudlin on 07/28/2021 13:51:44 -------------------------------------------------------------------------------- Physical Exam Details Patient Name: Date of Service: Gregory [name], [name] 07/28/2021 12:30 PM Medical Record Number: [id number] Patient Account Number: [id number] Date  of Birth/Sex: Treating RN: 1920-04-23 (88 y.o. [name] Primary Care Provider: Wylene Simmer Other Clinician: Referring Provider: Treating Provider/Extender: Roxy Cedar in Treatment: 12 Constitutional . . . . No acute distress.Marland Kitchen Respiratory Normal work of breathing on room air.. Notes 07/28/2021: The right lateral foot wound remains basically unchanged with some overlying eschar, underneath which there is a very small amount of slough. The right great toe is looking worse today. It is red and warm and swollen. There is what looks like a blood blister underneath the eschar on the medial aspect of the toe. The distal tip, however, does not look too bad. The left great toe wound is smaller. Electronic Signature(s) Signed: 07/28/2021 1:56:51 PM By: Fredirick Maudlin MD FACS Entered By: Fredirick Maudlin on 07/28/2021 13:56:50 -------------------------------------------------------------------------------- Physician Orders Details Patient Name: Date of Service: Gregory [name], [name] 07/28/2021 12:30 PM Medical Record Number: [id number] Patient Account Number: [id number] Date of Birth/Sex: Treating RN: 09/12/1920 (88 y.o. [name] Primary Care Provider: Wylene Simmer Other Clinician: Referring Provider: Treating Provider/Extender: Roxy Cedar in Treatment: 12 Verbal / Phone Orders: No Diagnosis Coding ICD-10 Coding Code Description I70.263 Atherosclerosis of native arteries of extremities with gangrene, bilateral legs I73.9 Peripheral vascular disease, unspecified E43 Unspecified severe protein-calorie malnutrition N18.31 Chronic kidney disease, stage 3a R13.10 Dysphagia, unspecified L97.512 Non-pressure chronic ulcer  of other part of right foot with fat layer exposed L97.522 Non-pressure chronic ulcer of other part of left foot with fat layer exposed L89.612 Pressure ulcer of right heel, stage 2 Follow-up  Appointments ppointment in 1 week. - Dr. Celine Ahr Room 3 Return A Bathing/ Shower/ Hygiene May shower and wash wound with soap and water. Edema Control - Lymphedema / SCD / Other Elevate legs to the level of the heart or above for 30 minutes daily and/or when sitting, a frequency of: - throughout the day Avoid standing for long periods of time. Non Wound Condition Bilateral Lower Extremities Protect area with: - Right and Left heels protect with Heel Cups Other Non Wound Condition Orders/Instructions: - Bilateral Heels- heel cups + gauze Home Health No change in wound care orders this week; continue Home Health for wound care. May utilize formulary equivalent dressing for wound treatment orders unless otherwise specified. Other Home Health Orders/Instructions: - [facility] Wound Treatment Wound #1 - T Great oe Wound Laterality: Right Cleanser: Soap and Water 1 x Per Day/30 Days Discharge Instructions: May shower and wash wound with dial antibacterial soap and water prior to dressing change. Cleanser: Wound Cleanser 1 x Per Day/30 Days Discharge Instructions: Cleanse the wound with wound cleanser or normal saline prior to applying a clean dressing using gauze sponges, not tissue or cotton balls. Topical: Gentamicin 1 x Per Day/30 Days Discharge Instructions: As directed by physician Prim Dressing: KerraCel Ag Gelling Fiber Dressing, 2x2 in (silver alginate) 1 x Per Day/30 Days ary Discharge Instructions: Apply silver alginate to wound bed as instructed Secondary Dressing: Woven Gauze Sponges 2x2 in ([place]) 1 x Per Day/30 Days Discharge Instructions: Apply over primary dressing as directed. Secured With: Child psychotherapist, Sterile 2x75 (in/in) (Home Health) 1 x Per Day/30 Days Discharge Instructions: Secure with stretch gauze as directed. Wound #2 - T Great oe Wound Laterality: Left Cleanser: Soap and Water 1 x Per Day/30 Days Discharge Instructions: May shower and  wash wound with dial antibacterial soap and water prior to dressing change. Cleanser: Wound Cleanser 1 x Per Day/30 Days Discharge Instructions: Cleanse the wound with wound cleanser or normal saline prior to applying a clean dressing using gauze sponges, not tissue or cotton balls. Prim Dressing: Iodosorb Gel 10 (gm) Tube (Home Health) (Generic) 1 x Per Day/30 Days ary Discharge Instructions: Apply to wound bed as instructed Secondary Dressing: Woven Gauze Sponges 2x2 in ([place]) 1 x Per Day/30 Days Discharge Instructions: Apply over primary dressing as directed. Secured With: Child psychotherapist, Sterile 2x75 (in/in) (Home Health) 1 x Per Day/30 Days Discharge Instructions: Secure with stretch gauze as directed. Wound #4 - Foot Wound Laterality: Right, Lateral Cleanser: Soap and Water (Home Health) 1 x Per Day/30 Days Discharge Instructions: May shower and wash wound with dial antibacterial soap and water prior to dressing change. Topical: Gentamicin 1 x Per Day/30 Days Discharge Instructions: As directed by physician Prim Dressing: KerraCel Ag Gelling Fiber Dressing, 2x2 in (silver alginate) 1 x Per Day/30 Days ary Discharge Instructions: Apply silver alginate to wound bed as instructed Prim Dressing: 1 x Per Day/30 Days ary Secondary Dressing: ALLEVYN Gentle Border, 3x3 (in/in) 1 x Per Day/30 Days Discharge Instructions: Apply over primary dressing as directed. Wound #5 - T Great oe Wound Laterality: Right, Medial Cleanser: Soap and Water (Home Health) 1 x Per Day/30 Days Discharge Instructions: May shower and wash wound with dial antibacterial soap and water prior to dressing change. Topical: Gentamicin  1 x Per Day/30 Days Discharge Instructions: As directed by physician Prim Dressing: KerraCel Ag Gelling Fiber Dressing, 2x2 in (silver alginate) 1 x Per Day/30 Days ary Discharge Instructions: Apply silver alginate to wound bed as instructed Secondary Dressing:  ALLEVYN Gentle Border, 3x3 (in/in) 1 x Per Day/30 Days Discharge Instructions: Apply over primary dressing as directed. Patient Medications llergies: cefepime, amlodipine, penicillin A Notifications Medication Indication Start End 07/28/2021 Bactrim DS DOSE oral 800 mg-160 mg tablet - 1 tab po BID x 14 days Electronic Signature(s) Signed: 07/28/2021 4:03:57 PM By: Fredirick Maudlin MD FACS Signed: 07/28/2021 5:29:34 PM By: Dellie Catholic RN Previous Signature: 07/28/2021 1:58:14 PM Version By: Fredirick Maudlin MD FACS Entered By: Dellie Catholic on 07/28/2021 15:45:06 -------------------------------------------------------------------------------- Problem List Details Patient Name: Date of Service: Gregory [name], [name] 07/28/2021 12:30 PM Medical Record Number: [id number] Patient Account Number: [id number] Date of Birth/Sex: Treating RN: 04/28/1920 (88 y.o. [name] Primary Care Provider: Wylene Simmer Other Clinician: Referring Provider: Treating Provider/Extender: Roxy Cedar in Treatment: 12 Active Problems ICD-10 Encounter Code Description Active Date MDM Diagnosis I70.263 Atherosclerosis of native arteries of extremities with gangrene, bilateral legs 05/05/2021 No Yes I73.9 Peripheral vascular disease, unspecified 05/05/2021 No Yes E43 Unspecified severe protein-calorie malnutrition 05/05/2021 No Yes N18.31 Chronic kidney disease, stage 3a 05/05/2021 No Yes R13.10 Dysphagia, unspecified 05/05/2021 No Yes L97.512 Non-pressure chronic ulcer of other part of right foot with fat layer exposed 06/07/2021 No Yes L97.522 Non-pressure chronic ulcer of other part of left foot with fat layer exposed 06/07/2021 No Yes L89.612 Pressure ulcer of right heel, stage 2 07/06/2021 No Yes Inactive Problems ICD-10 Code Description Active Date Inactive Date L89.622 Pressure ulcer of left heel, stage 2 05/05/2021 05/05/2021 Resolved Problems Electronic  Signature(s) Signed: 07/28/2021 1:49:58 PM By: Fredirick Maudlin MD FACS Entered By: Fredirick Maudlin on 07/28/2021 13:49:58 -------------------------------------------------------------------------------- Progress Note Details Patient Name: Date of Service: Gregory [name], [name] 07/28/2021 12:30 PM Medical Record Number: [id number] Patient Account Number: [id number] Date of Birth/Sex: Treating RN: 1920-12-16 (88 y.o. [name] Primary Care Provider: Wylene Simmer Other Clinician: Referring Provider: Treating Provider/Extender: Roxy Cedar in Treatment: 12 Subjective Chief Complaint Information obtained from Patient Patient is at the clinic for treatment of an open pressure ulcer on his left heel and dry gangrene of both great toes History of Present Illness (HPI) ADMISSION 05/05/2021 This is an extremely frail 88 year old man who presents to clinic today accompanied by his son for evaluation of a pressure ulcer on his left heel. He also has dry gangrene on the tips of his bilateral great toes and the lateral aspect of both feet. It sounds like after a hospitalization for a GI bleed, he had a blanket on his feet which ultimately resulted in a pressure injury. He has severe dysphagia and is dependent on G-tube feeding. The wound on his heel has been present since January. He has 24-hour care and they have been applying Xeroform to the heel. He apparently has some sort of air boot that he is meant to wear in bed at night. He has severe peripheral arterial disease as evidenced by the ABIs obtained by Dr. Rachelle Hora and copied here: ABI Findings: +---------+------------------+-----+----------+--------+ Right Rt Pressure (mmHg)IndexWaveform Comment  +---------+------------------+-----+----------+--------+ Brachial 136    +---------+------------------+-----+----------+--------+ PTA >255 [place] monophasic   +---------+------------------+-----+----------+--------+ DP >255 [place] monophasic  +---------+------------------+-----+----------+--------+ Great T oe24 0.18    +---------+------------------+-----+----------+--------+ +---------+------------------+-----+----------+-------+ Left Lt Pressure (mmHg)IndexWaveform Comment +---------+------------------+-----+----------+-------+ Brachial 136    +---------+------------------+-----+----------+-------+  PTA >255 [place] monophasic  +---------+------------------+-----+----------+-------+ DP >255 [place] monophasic  +---------+------------------+-----+----------+-------+ Great T oe0 0.00 Absent   +---------+------------------+-----+----------+-------+ +-------+-----------+-----------+------------+------------+ ABI/TBIT oday's ABIT oday's TBIPrevious ABIPrevious TBI +-------+-----------+-----------+------------+------------+ Right [place] 0.18    +-------+-----------+-----------+------------+------------+ Left [place] 0.00    +-------+-----------+-----------+------------+------------+ Summary: Right: Resting right ankle-brachial index indicates noncompressible right lower extremity arteries. The right toe-brachial index is abnormal. Left: Resting left ankle-brachial index indicates noncompressible left lower extremity arteries. Absent left great toe PPG waveform. He saw Dr. Rachelle Hora on March 16 and at that time, he was felt to be far too frail to undergo any sort of intervention including an aortogram. Certainly not a surgical candidate. According to the electronic medical record, Dr. Rachelle Hora is planning to see him back in a couple of weeks and Gregory determine if he has made any significant improvement in his overall status and might be suitable for an intervention. I read his note to suggest that the patient has had multiple complications from other procedures and that even an aortogram may be too  risky. 05/12/2021: Rather remarkably, the eschar and dry gangrene on his bilateral great toes and bilateral fifth metatarsals actually looks better. Some of the eschar on his toes is peeling away to reveal healing tissue underneath. This is also the case with his lateral foot bilaterally. The heel is clean and superficial, with minimal slough. 06/07/2021: Since our last visit, Mr. Mahar has been in the hospital quite a bit. While in the hospital, they were painting his lateral feet and great toes with Betadine. The wound on his heel is nearly closed with just a sliver of open skin. They have been floating his heels as directed and his home health nurse recently ordered a device to keep his blankets from rubbing on his toes. They have continued using the Betadine on his lateral feet and great toes and the Prisma silver collagen on his left heel. He does have a bit of callus buildup on his right Achilles tendon area suggesting there is some pressure being applied. 06/15/2021: The left heel wound is closed. There is some slough buildup in the right lateral foot wound. Both great toe wounds have a layer of thick dark eschar, but they appear to have some viable tissue underneath this. He is complaining of pain in his right heel; he had a wound there at one time. On inspection, there is no open skin at this site. 06/23/2021: The left heel remains closed. The right heel never has opened. Both great toe wounds continue to have a layer of thick dark eschar, but it is softer today. The right lateral foot wound also has eschar overlying the surface which has some accumulated slough. He also has areas on his great toes bilaterally that look like they have been rubbing on the slippers that he wears all day. 07/06/2021: The right heel has a new callus on it. It peeled off as I was examining the site and there is a small open wound in that area. The right great toe looks worse today; there are more darkened areas of  eschar and the toe itself feels a bit boggy. The right lateral foot is small with just a small amount of slough. The left great toe is in reasonable condition with just a layer of fairly soft eschar at the tip. According to the patient's family, he has been insisting upon blankets being wrapped over his feet at night and he does not cooperate with heel flotation. He also required a 2 unit blood transfusion last  week for fairly significant anemia. 07/13/2021: Both heels are closed. The right lateral foot wound is small, but about the same size as last week. There is some accumulated slough within the wound. The right great toe looks better today; it is no longer boggy and the Betadine paint has dried up the areas that were concerning to me. The left great toe looks to be responding nicely to the Iodosorb dressing. 07/21/2021: The heels remain closed. The right lateral foot wound is about the same size as last week, but he is complaining of more pain in the area and there is some perimeter erythema. The right great toe is looking much better today; the Betadine paint continues to keep the areas of concern dry. The left great toe is doing well with a nice wound surface underneath the crusted-on Iodosorb. 07/28/2021: The right lateral foot wound remains basically unchanged with some overlying eschar, underneath which there is a very small amount of slough. The right great toe is looking worse today. It is red and warm and swollen. There is what looks like a blood blister underneath the eschar on the medial aspect of the toe. The distal tip, however, does not look too bad. The left great toe wound is smaller. Patient History Information obtained from Patient, Caregiver. Family History Unknown History. Social History Never smoker, Marital Status - Widowed, Alcohol Use - Never, Drug Use - No History, Caffeine Use - Never. Medical History Cardiovascular Patient has history of Arrhythmia - A-Fib,  Hypertension Medical A Surgical History Notes nd Cardiovascular Pacemaker, Complete heart block Gastrointestinal Dysphagia, PEG tube in place Endocrine Hypothyroidism Genitourinary CKD-3 Neurologic Neuromuscular disorder Objective Constitutional No acute distress.. Vitals Time Taken: 12:51 PM, Temperature: 97.9 F, Pulse: 80 bpm, Respiratory Rate: 18 breaths/min, Blood Pressure: 117/67 mmHg. Respiratory Normal work of breathing on room air.. General Notes: 07/28/2021: The right lateral foot wound remains basically unchanged with some overlying eschar, underneath which there is a very small amount of slough. The right great toe is looking worse today. It is red and warm and swollen. There is what looks like a blood blister underneath the eschar on the medial aspect of the toe. The distal tip, however, does not look too bad. The left great toe wound is smaller. Integumentary (Hair, Skin) Wound #1 status is Open. Original cause of wound was Pressure Injury. The date acquired was: 02/12/2021. The wound has been in treatment 12 weeks. The wound is located on the Right T Great. The wound measures 0.6cm length x 0.4cm width x 0.1cm depth; 0.188cm^2 area and 0.019cm^3 volume. There is Fat oe Layer (Subcutaneous Tissue) exposed. There is no tunneling or undermining noted. There is a medium amount of serosanguineous drainage noted. The wound margin is flat and intact. There is small (1-33%) red granulation within the wound bed. There is a large (67-100%) amount of necrotic tissue within the wound bed including Eschar. Wound #2 status is Open. Original cause of wound was Pressure Injury. The date acquired was: 02/12/2021. The wound has been in treatment 12 weeks. The wound is located on the Left T Great. The wound measures 0.3cm length x 0.2cm width x 0.1cm depth; 0.047cm^2 area and 0.005cm^3 volume. There is Fat oe Layer (Subcutaneous Tissue) exposed. There is no tunneling or undermining noted.  There is a small amount of serosanguineous drainage noted. The wound margin is flat and intact. There is no granulation within the wound bed. There is a large (67-100%) amount of necrotic tissue within the wound bed  including Eschar and Adherent Slough. Wound #4 status is Open. Original cause of wound was Gradually Appeared. The date acquired was: 04/12/2021. The wound has been in treatment 7 weeks. The wound is located on the Right,Lateral Foot. The wound measures 0.1cm length x 0.1cm width x 0.1cm depth; 0.008cm^2 area and 0.001cm^3 volume. There is Fat Layer (Subcutaneous Tissue) exposed. There is no tunneling or undermining noted. There is a small amount of serosanguineous drainage noted. The wound margin is flat and intact. There is small (1-33%) red granulation within the wound bed. There is a large (67-100%) amount of necrotic tissue within the wound bed including Eschar. Wound #5 status is Open. Original cause of wound was Blister. The date acquired was: 06/12/2021. The wound has been in treatment 5 weeks. The wound is located on the Right,Medial T Great. The wound measures 0.9cm length x 0.5cm width x 0.1cm depth; 0.353cm^2 area and 0.035cm^3 volume. There is Fat oe Layer (Subcutaneous Tissue) exposed. There is no tunneling or undermining noted. There is a medium amount of serosanguineous drainage noted. There is small (1-33%) red granulation within the wound bed. There is a large (67-100%) amount of necrotic tissue within the wound bed including Eschar and Adherent Slough. General Notes: Blood Blister developed on Medial great toe (R) Assessment Active Problems ICD-10 Atherosclerosis of native arteries of extremities with gangrene, bilateral legs Peripheral vascular disease, unspecified Unspecified severe protein-calorie malnutrition Chronic kidney disease, stage 3a Dysphagia, unspecified Non-pressure chronic ulcer of other part of right foot with fat layer exposed Non-pressure chronic  ulcer of other part of left foot with fat layer exposed Pressure ulcer of right heel, stage 2 Procedures Wound #1 Pre-procedure diagnosis of Wound #1 is an Arterial Insufficiency Ulcer located on the Right T Great .Severity of Tissue Pre Debridement is: Fat layer oe exposed. There was a Selective/Open Wound Non-Viable Tissue Debridement with a total area of 0.24 sq cm performed by Fredirick Maudlin, MD. With the following instrument(s): Curette to remove Non-Viable tissue/material. Material removed includes Eschar and Callus and after achieving pain control using Other (Benzocaine 20%). No specimens were taken. A time out was conducted at 13:30, prior to the start of the procedure. A Minimum amount of bleeding was controlled with Pressure. The procedure was tolerated well with a pain level of 0 throughout and a pain level of 0 following the procedure. Post Debridement Measurements: 0.6cm length x 0.4cm width x 0.1cm depth; 0.019cm^3 volume. Character of Wound/Ulcer Post Debridement is improved. Severity of Tissue Post Debridement is: Fat layer exposed. Post procedure Diagnosis Wound #1: Same as Pre-Procedure Wound #5 Pre-procedure diagnosis of Wound #5 is a Pressure Ulcer located on the Right,Medial T Great . There was a Excisional Skin/Subcutaneous Tissue oe Debridement with a total area of 0.45 sq cm performed by Fredirick Maudlin, MD. With the following instrument(s): Curette to remove Non-Viable tissue/material. Material removed includes Eschar, Callus, Subcutaneous Tissue, Slough, and Skin: Dermis after achieving pain control using Other (Benzocaine 20%). No specimens were taken. A time out was conducted at 13:30, prior to the start of the procedure. A Minimum amount of bleeding was controlled with Pressure. The procedure was tolerated well with a pain level of 0 throughout and a pain level of 0 following the procedure. Post Debridement Measurements: 0.9cm length x 0.5cm width x 0.1cm depth;  0.035cm^3 volume. Post debridement Stage noted as Category/Stage II. Character of Wound/Ulcer Post Debridement is improved. Post procedure Diagnosis Wound #5: Same as Pre-Procedure Plan Follow-up Appointments: Return Appointment in 1  week. - Dr. Celine Ahr Room 3 Bathing/ Shower/ Hygiene: May shower and wash wound with soap and water. Edema Control - Lymphedema / SCD / Other: Elevate legs to the level of the heart or above for 30 minutes daily and/or when sitting, a frequency of: - throughout the day Avoid standing for long periods of time. Non Wound Condition: Protect area with: - Right and Left heels protect with Heel Cups Home Health: No change in wound care orders this week; continue Home Health for wound care. May utilize formulary equivalent dressing for wound treatment orders unless otherwise specified. Other Home Health Orders/Instructions: - [facility] The following medication(s) was prescribed: Bactrim DS oral 800 mg-160 mg tablet 1 tab po BID x 14 days starting 07/28/2021 WOUND #1: - T Great Wound Laterality: Right oe Cleanser: Soap and Water 1 x Per Day/30 Days Discharge Instructions: May shower and wash wound with dial antibacterial soap and water prior to dressing change. Cleanser: Wound Cleanser 1 x Per Day/30 Days Discharge Instructions: Cleanse the wound with wound cleanser or normal saline prior to applying a clean dressing using gauze sponges, not tissue or cotton balls. Topical: Gentamicin 1 x Per Day/30 Days Discharge Instructions: As directed by physician Prim Dressing: KerraCel Ag Gelling Fiber Dressing, 2x2 in (silver alginate) 1 x Per Day/30 Days ary Discharge Instructions: Apply silver alginate to wound bed as instructed Secondary Dressing: Woven Gauze Sponges 2x2 in ([place]) 1 x Per Day/30 Days Discharge Instructions: Apply over primary dressing as directed. Secured With: Child psychotherapist, Sterile 2x75 (in/in) (Home Health) 1 x Per Day/30  Days Discharge Instructions: Secure with stretch gauze as directed. WOUND #2: - T Great Wound Laterality: Left oe Cleanser: Soap and Water 1 x Per Day/30 Days Discharge Instructions: May shower and wash wound with dial antibacterial soap and water prior to dressing change. Cleanser: Wound Cleanser 1 x Per Day/30 Days Discharge Instructions: Cleanse the wound with wound cleanser or normal saline prior to applying a clean dressing using gauze sponges, not tissue or cotton balls. Prim Dressing: Iodosorb Gel 10 (gm) Tube (Home Health) (Generic) 1 x Per Day/30 Days ary Discharge Instructions: Apply to wound bed as instructed Secondary Dressing: Woven Gauze Sponges 2x2 in ([place]) 1 x Per Day/30 Days Discharge Instructions: Apply over primary dressing as directed. Secured With: Child psychotherapist, Sterile 2x75 (in/in) (Home Health) 1 x Per Day/30 Days Discharge Instructions: Secure with stretch gauze as directed. WOUND #4: - Foot Wound Laterality: Right, Lateral Cleanser: Soap and Water (Home Health) 1 x Per Day/30 Days Discharge Instructions: May shower and wash wound with dial antibacterial soap and water prior to dressing change. Topical: Gentamicin 1 x Per Day/30 Days Discharge Instructions: As directed by physician Prim Dressing: KerraCel Ag Gelling Fiber Dressing, 2x2 in (silver alginate) 1 x Per Day/30 Days ary Discharge Instructions: Apply silver alginate to wound bed as instructed Prim Dressing: 1 x Per Day/30 Days ary Secondary Dressing: ALLEVYN Gentle Border, 3x3 (in/in) 1 x Per Day/30 Days Discharge Instructions: Apply over primary dressing as directed. WOUND #5: - T Great Wound Laterality: Right, Medial oe Cleanser: Soap and Water (Home Health) 1 x Per Day/30 Days Discharge Instructions: May shower and wash wound with dial antibacterial soap and water prior to dressing change. Topical: Gentamicin 1 x Per Day/30 Days Discharge Instructions: As directed by  physician Prim Dressing: KerraCel Ag Gelling Fiber Dressing, 2x2 in (silver alginate) 1 x Per Day/30 Days ary Discharge Instructions: Apply silver alginate to wound  bed as instructed Secondary Dressing: ALLEVYN Gentle Border, 3x3 (in/in) 1 x Per Day/30 Days Discharge Instructions: Apply over primary dressing as directed. 07/28/2021: The right lateral foot wound remains basically unchanged with some overlying eschar, underneath which there is a very small amount of slough. The right great toe is looking worse today. It is red and warm and swollen. There is what looks like a blood blister underneath the eschar on the medial aspect of the toe. The distal tip, however, does not look too bad. The left great toe wound is smaller. I debrided all of the ulcers of slough, subcutaneous tissue, and eschar, using a curette. As I debrided the medial right great toe wound, frank pus oozed out. I took a culture and Gregory empirically prescribe Bactrim. Once the culture data are back, I Gregory adjust antibiotics appropriately. We Gregory use topical mupirocin to the right toe on both wounds as well as the right lateral foot. Cover all of these with silver alginate. Continue Iodosorb to the left great toe. Follow-up in 1 week. Electronic Signature(s) Signed: 07/28/2021 2:00:31 PM By: Fredirick Maudlin MD FACS Entered By: Fredirick Maudlin on 07/28/2021 14:00:31 -------------------------------------------------------------------------------- HxROS Details Patient Name: Date of Service: Gregory [name], [name] 07/28/2021 12:30 PM Medical Record Number: [id number] Patient Account Number: [id number] Date of Birth/Sex: Treating RN: 05-Jan-1921 (88 y.o. [name] Primary Care Provider: Wylene Simmer Other Clinician: Referring Provider: Treating Provider/Extender: Roxy Cedar in Treatment: 12 Information Obtained From Patient Caregiver Cardiovascular Medical History: Positive for:  Arrhythmia - A-Fib; Hypertension Past Medical History Notes: Pacemaker, Complete heart block Gastrointestinal Medical History: Past Medical History Notes: Dysphagia, PEG tube in place Endocrine Medical History: Past Medical History Notes: Hypothyroidism Genitourinary Medical History: Past Medical History Notes: CKD-3 Neurologic Medical History: Past Medical History Notes: Neuromuscular disorder Immunizations Pneumococcal Vaccine: Received Pneumococcal Vaccination: Yes Received Pneumococcal Vaccination On or After 60th Birthday: No Implantable Devices Yes Family and Social History Unknown History: Yes; Never smoker; Marital Status - Widowed; Alcohol Use: Never; Drug Use: No History; Caffeine Use: Never; Financial Concerns: No; Food, Clothing or Shelter Needs: No; Support System Lacking: No; Transportation Concerns: No Electronic Signature(s) Signed: 07/28/2021 2:17:12 PM By: Fredirick Maudlin MD FACS Signed: 07/28/2021 5:29:34 PM By: Dellie Catholic RN Entered By: Fredirick Maudlin on 07/28/2021 13:51:49 -------------------------------------------------------------------------------- SuperBill Details Patient Name: Date of Service: Gregory [name], [name] 07/28/2021 Medical Record Number: [id number] Patient Account Number: [id number] Date of Birth/Sex: Treating RN: 1921/01/24 (88 y.o. [name] Primary Care Provider: Wylene Simmer Other Clinician: Referring Provider: Treating Provider/Extender: Roxy Cedar in Treatment: 12 Diagnosis Coding ICD-10 Codes Code Description [phone] Atherosclerosis of native arteries of extremities with gangrene, bilateral legs I73.9 Peripheral vascular disease, unspecified E43 Unspecified severe protein-calorie malnutrition N18.31 Chronic kidney disease, stage 3a R13.10 Dysphagia, unspecified L97.512 Non-pressure chronic ulcer of other part of right foot with fat layer exposed L97.522 Non-pressure  chronic ulcer of other part of left foot with fat layer exposed L89.612 Pressure ulcer of right heel, stage 2 Facility Procedures CPT4 Code: 62694854 Description: 11042 - DEB SUBQ TISSUE 20 SQ CM/< ICD-10 Diagnosis Description L97.512 Non-pressure chronic ulcer of other part of right foot with fat layer exposed L97.522 Non-pressure chronic ulcer of other part of left foot with fat layer exposed Modifier: Quantity: 1 CPT4 Code: 62703500 Description: 93818 - DEBRIDE WOUND 1ST 20 SQ CM OR < ICD-10 Diagnosis Description L97.512 Non-pressure chronic ulcer of other part of right foot with fat layer exposed  Modifier: Quantity: 1 Physician Procedures : CPT4 Code Description Modifier [id number] 90211 - WC PHYS LEVEL 4 - EST PT 25 ICD-10 Diagnosis Description L97.512 Non-pressure chronic ulcer of other part of right foot with fat layer exposed L97.522 Non-pressure chronic ulcer of other part of left foot  with fat layer exposed I70.263 Atherosclerosis of native arteries of extremities with gangrene, bilateral legs I73.9 Peripheral vascular disease, unspecified Quantity: 1 : [id number] 11042 - WC PHYS SUBQ TISS 20 SQ CM ICD-10 Diagnosis Description L97.512 Non-pressure chronic ulcer of other part of right foot with fat layer exposed L97.522 Non-pressure chronic ulcer of other part of left foot with fat layer exposed Quantity: 1 : [id number] 24497 - WC PHYS DEBR WO ANESTH 20 SQ CM ICD-10 Diagnosis Description L97.512 Non-pressure chronic ulcer of other part of right foot with fat layer exposed Quantity: 1 Electronic Signature(s) Signed: 07/28/2021 2:01:03 PM By: Fredirick Maudlin MD FACS Entered By: Fredirick Maudlin on 07/28/2021 14:01:03

## 2021-07-28 NOTE — Progress Notes (Signed)
Gregory [name], Gregory [name] ([id number]) Visit Report for 07/28/2021 Arrival Information Details Patient Name: Date of Service: Gregory [name] RNO LD 07/28/2021 12:30 PM Medical Record Number: [id number] Patient Account Number: [id number] Date of Birth/Sex: Treating RN: [birth year] (88 y.o. [name] Primary Care Saqib Cazarez: Wylene Simmer Other Clinician: Referring Zamaya Rapaport: Treating Jere Vanburen/Extender: Roxy Cedar in Treatment: 12 Visit Information History Since Last Visit Added or deleted any medications: No Patient Arrived: Wheel Chair Any new allergies or adverse reactions: No Arrival Time: 12:49 Had Gregory [name] fall or experienced change in No Accompanied By: daughter activities of daily living that may affect Transfer Assistance: Manual risk of falls: Patient Identification Verified: Yes Signs or symptoms of abuse/neglect since last visito No Patient Requires Transmission-Based Precautions: No Hospitalized since last visit: No Patient Has Alerts: Yes Implantable device outside of the clinic excluding No Patient Alerts: R ABI: [place] TBI: 0.18 cellular tissue based products placed in the center L ABI: [place] TBI: 0.0 since last visit: Has Dressing in Place as Prescribed: Yes Pain Present Now: No Electronic Signature(s) Signed: 07/28/2021 5:29:34 PM By: Dellie Catholic RN Entered By: Dellie Catholic on 07/28/2021 12:49:33 -------------------------------------------------------------------------------- Encounter Discharge Information Details Patient Name: Date of Service: Gregory [name], Gregory [name] RNO LD 07/28/2021 12:30 PM Medical Record Number: [id number] Patient Account Number: [id number] Date of Birth/Sex: Treating RN: 05-10-1920 (88 y.o. [name] Primary Care Linnette Panella: Wylene Simmer Other Clinician: Referring Johnathon Mittal: Treating Cataldo Cosgriff/Extender: Roxy Cedar in Treatment: 12 Encounter Discharge Information Items Post Procedure  Vitals Discharge Condition: Stable Temperature (F): 97.9 Ambulatory Status: Wheelchair Pulse (bpm): 80 Discharge Destination: Home Respiratory Rate (breaths/min): 18 Transportation: Private Auto Blood Pressure (mmHg): 117/67 Accompanied By: daughter Schedule Follow-up Appointment: Yes Clinical Summary of Care: Patient Declined Electronic Signature(s) Signed: 07/28/2021 5:29:34 PM By: Dellie Catholic RN Entered By: Dellie Catholic on 07/28/2021 17:25:14 -------------------------------------------------------------------------------- Lower Extremity Assessment Details Patient Name: Date of Service: Gregory [name] RNO LD 07/28/2021 12:30 PM Medical Record Number: [id number] Patient Account Number: [id number] Date of Birth/Sex: Treating RN: October 10, 1920 (88 y.o. [name] Primary Care Aricela Bertagnolli: Wylene Simmer Other Clinician: Referring Savan Ruta: Treating Bridney Guadarrama/Extender: Roxy Cedar in Treatment: 12 Edema Assessment Assessed: [Left: No] [Right: No] E[Left: dema] [Right: :] Calf Left: Right: Point of Measurement: From Medial Instep 29 cm 28.8 cm Ankle Left: Right: Point of Measurement: From Medial Instep 19 cm 18.7 cm Electronic Signature(s) Signed: 07/28/2021 5:29:34 PM By: Dellie Catholic RN Entered By: Dellie Catholic on 07/28/2021 12:54:21 -------------------------------------------------------------------------------- Multi Wound Chart Details Patient Name: Date of Service: Gregory [name], Gregory [name] RNO LD 07/28/2021 12:30 PM Medical Record Number: [id number] Patient Account Number: [id number] Date of Birth/Sex: Treating RN: 12/10/1920 (88 y.o. [name] Primary Care Sharonna Vinje: Wylene Simmer Other Clinician: Referring Britani Beattie: Treating Gerilynn Mccullars/Extender: Roxy Cedar in Treatment: 12 Vital Signs Height(in): Pulse(bpm): 102 Weight(lbs): Blood Pressure(mmHg): 117/67 Body Mass  Index(BMI): Temperature(F): 97.9 Respiratory Rate(breaths/min): 18 Photos: Right T Great oe Left T Great oe Right, Lateral Foot Wound Location: Pressure Injury Pressure Injury Gradually Appeared Wounding Event: Arterial Insufficiency Ulcer Arterial Insufficiency Ulcer Pressure Ulcer Primary Etiology: Arrhythmia, Hypertension Arrhythmia, Hypertension Arrhythmia, Hypertension Comorbid History: 02/12/2021 02/12/2021 04/12/2021 Date Acquired: '12 12 7 '$ Weeks of Treatment: Open Open Open Wound Status: No No No Wound Recurrence: 0.6x0.4x0.1 0.3x0.2x0.1 0.1x0.1x0.1 Measurements L x W x D (cm) 0.188 0.047 0.008 Gregory [name] (cm) : rea 0.019 0.005 0.001 Volume (cm) : 96.00% 98.80% 83.00% % Reduction in Area: 96.00% 98.70% 80.00% % Reduction in Volume: Full  Thickness Without Exposed Unclassifiable Category/Stage II Classification: Support Structures Medium Small Small Exudate Gregory [name] mount: Serosanguineous Serosanguineous Serosanguineous Exudate Type: red, brown red, brown red, brown Exudate Color: Flat and Intact Flat and Intact Flat and Intact Wound Margin: Small (1-33%) None Present (0%) Small (1-33%) Granulation Gregory [name] mount: Red N/Gregory [name] Red Granulation Quality: Large (67-100%) Large (67-100%) Large (67-100%) Necrotic Gregory [name] mount: Eschar Eschar, Adherent Slough Eschar Necrotic Tissue: Fat Layer (Subcutaneous Tissue): Yes Fat Layer (Subcutaneous Tissue): Yes Fat Layer (Subcutaneous Tissue): Yes Exposed Structures: Fascia: No Fascia: No Fascia: No Tendon: No Tendon: No Tendon: No Muscle: No Muscle: No Muscle: No Joint: No Joint: No Joint: No Bone: No Bone: No Bone: No Large (67-100%) None Small (1-33%) Epithelialization: Debridement - Selective/Open Wound Debridement - Excisional Debridement - Selective/Open Wound Debridement: Pre-procedure Verification/Time Out 13:30 13:30 13:30 Taken: Other Other Other Pain Control: Necrotic/Eschar, Callus Necrotic/Eschar, Callus,  Necrotic/Eschar, Slough Tissue Debrided: Subcutaneous, Slough Non-Viable Tissue Skin/Subcutaneous Tissue Non-Viable Tissue Level: 0.24 0.06 0.01 Debridement Gregory [name] (sq cm): rea Curette Curette Curette Instrument: Minimum Minimum Minimum Bleeding: Pressure Pressure Pressure Hemostasis Achieved: 0 0 0 Procedural Pain: 0 0 0 Post Procedural Pain: Procedure was tolerated well Procedure was tolerated well Procedure was tolerated well Debridement Treatment Response: 0.6x0.4x0.1 0.3x0.2x0.1 0.1x0.1x0.1 Post Debridement Measurements L x W x D (cm) 0.019 0.005 0.001 Post Debridement Volume: (cm) N/Gregory [name] N/Gregory [name] Category/Stage II Post Debridement Stage: N/Gregory [name] N/Gregory [name] N/Gregory [name] Assessment Notes: Debridement N/Gregory [name] N/Gregory [name] Procedures Performed: Wound Number: 5 N/Gregory [name] N/Gregory [name] Photos: N/Gregory [name] N/Gregory [name] Right, Medial T Great oe N/Gregory [name] N/Gregory [name] Wound Location: Blister N/Gregory [name] N/Gregory [name] Wounding Event: Pressure Ulcer N/Gregory [name] N/Gregory [name] Primary Etiology: Arrhythmia, Hypertension N/Gregory [name] N/Gregory [name] Comorbid History: 06/12/2021 N/Gregory [name] N/Gregory [name] Date Acquired: 5 N/Gregory [name] N/Gregory [name] Weeks of Treatment: Open N/Gregory [name] N/Gregory [name] Wound Status: No N/Gregory [name] N/Gregory [name] Wound Recurrence: 0.9x0.5x0.1 N/Gregory [name] N/Gregory [name] Measurements L x W x D (cm) 0.353 N/Gregory [name] N/Gregory [name] Gregory [name] (cm) : rea 0.035 N/Gregory [name] N/Gregory [name] Volume (cm) : -397.20% N/Gregory [name] N/Gregory [name] % Reduction in Gregory [name] rea: -400.00% N/Gregory [name] N/Gregory [name] % Reduction in Volume: Category/Stage II N/Gregory [name] N/Gregory [name] Classification: Medium N/Gregory [name] N/Gregory [name] Exudate Gregory [name] mount: Serosanguineous N/Gregory [name] N/Gregory [name] Exudate Type: red, brown N/Gregory [name] N/Gregory [name] Exudate Color: N/Gregory [name] N/Gregory [name] N/Gregory [name] Wound Margin: Small (1-33%) N/Gregory [name] N/Gregory [name] Granulation Gregory [name] mount: Red N/Gregory [name] N/Gregory [name] Granulation Quality: Large (67-100%) N/Gregory [name] N/Gregory [name] Necrotic Gregory [name] mount: Eschar, Adherent Slough N/Gregory [name] N/Gregory [name] Necrotic Tissue: Fat Layer (Subcutaneous Tissue): Yes N/Gregory [name] N/Gregory [name] Exposed Structures: Fascia: No Tendon: No Muscle: No Joint: No Bone: No None N/Gregory [name] N/Gregory [name] Epithelialization: Debridement - Excisional N/Gregory [name] N/Gregory [name] Debridement: 13:30 N/Gregory [name] N/Gregory [name] Pre-procedure Verification/Time Out Taken: Other N/Gregory [name] N/Gregory [name] Pain  Control: Necrotic/Eschar, Callus, N/Gregory [name] N/Gregory [name] Tissue Debrided: Subcutaneous, Slough Skin/Subcutaneous Tissue N/Gregory [name] N/Gregory [name] Level: 0.45 N/Gregory [name] N/Gregory [name] Debridement Gregory [name] (sq cm): rea Curette N/Gregory [name] N/Gregory [name] Instrument: Minimum N/Gregory [name] N/Gregory [name] Bleeding: Pressure N/Gregory [name] N/Gregory [name] Hemostasis Achieved: 0 N/Gregory [name] N/Gregory [name] Procedural Pain: 0 N/Gregory [name] N/Gregory [name] Post Procedural Pain: Debridement Treatment Response: Procedure was tolerated well N/Gregory [name] N/Gregory [name] Post Debridement Measurements L x 0.9x0.5x0.1 N/Gregory [name] N/Gregory [name] W x D (cm) 0.035 N/Gregory [name] N/Gregory [name] Post Debridement Volume: (cm) Category/Stage II N/Gregory [name] N/Gregory [name] Post Debridement Stage: Blood Blister developed on Medial great N/Gregory [name] N/Gregory [name] Assessment Notes: toe (R) Debridement N/Gregory [name] N/Gregory [name] Procedures Performed: Treatment Notes Electronic Signature(s) Signed: 07/28/2021 1:50:10 PM By: Fredirick Maudlin MD FACS Signed: 07/28/2021 5:29:34 PM By: Dellie Catholic RN Entered By: Fredirick Maudlin on 07/28/2021 13:50:10 -------------------------------------------------------------------------------- Multi-Disciplinary Care Plan Details Patient Name: Date of Service: Gregory [name], Gregory [name] RNO LD 07/28/2021 12:30 PM Medical Record Number: [id number] Patient Account Number: [id number] Date of Birth/Sex: Treating RN: 1920-11-21 (88 y.o. [name] Primary Care  Kamri Gotsch: Wylene Simmer Other Clinician: Referring Whittley Carandang: Treating Zlaty Alexa/Extender: Roxy Cedar in Treatment: 12 Multidisciplinary Care Plan reviewed with physician Active Inactive Abuse / Safety / Falls / Self Care Management Nursing Diagnoses: Potential for falls Potential for injury related to falls Goals: Patient Gregory not experience any injury related to falls Date Initiated: 05/05/2021 Target Resolution Date: 09/08/2021 Goal Status: Active Patient/caregiver Gregory verbalize/demonstrate measures taken to prevent injury and/or falls Date Initiated: 05/05/2021 Target Resolution Date: 09/08/2021 Goal Status: Active Interventions: Assess  Activities of Daily Living upon admission and as needed Assess fall risk on admission and as needed Assess: immobility, friction, shearing, incontinence upon admission and as needed Assess impairment of mobility on admission and as needed per policy Assess personal safety and home safety (as indicated) on admission and as needed Assess self care needs on admission and as needed Provide education on fall prevention Provide education on personal and home safety Notes: Nutrition Nursing Diagnoses: Potential for alteratiion in Nutrition/Potential for imbalanced nutrition Goals: Patient/caregiver agrees to and verbalizes understanding of need to use nutritional supplements and/or vitamins as prescribed Date Initiated: 05/05/2021 Target Resolution Date: 09/08/2021 Goal Status: Active Interventions: Assess HgA1c results as ordered upon admission and as needed Assess patient nutrition upon admission and as needed per policy Provide education on elevated blood sugars and impact on wound healing Provide education on nutrition Treatment Activities: Education provided on Nutrition : 05/05/2021 Notes: Wound/Skin Impairment Nursing Diagnoses: Impaired tissue integrity Knowledge deficit related to ulceration/compromised skin integrity Goals: Patient/caregiver Gregory verbalize understanding of skin care regimen Date Initiated: 05/05/2021 Target Resolution Date: 09/08/2021 Goal Status: Active Interventions: Assess patient/caregiver ability to obtain necessary supplies Assess patient/caregiver ability to perform ulcer/skin care regimen upon admission and as needed Assess ulceration(s) every visit Provide education on ulcer and skin care Notes: Electronic Signature(s) Signed: 07/28/2021 5:29:34 PM By: Dellie Catholic RN Entered By: Dellie Catholic on 07/28/2021 15:46:06 -------------------------------------------------------------------------------- Pain Assessment Details Patient Name: Date of  Service: Gregory [name] RNO LD 07/28/2021 12:30 PM Medical Record Number: [id number] Patient Account Number: [id number] Date of Birth/Sex: Treating RN: 03/19/1920 (88 y.o. [name] Primary Care Leyland Kenna: Wylene Simmer Other Clinician: Referring Yeraldy Spike: Treating Ryett Hamman/Extender: Roxy Cedar in Treatment: 12 Active Problems Location of Pain Severity and Description of Pain Patient Has Paino No Site Locations Pain Management and Medication Current Pain Management: Electronic Signature(s) Signed: 07/28/2021 5:29:34 PM By: Dellie Catholic RN Entered By: Dellie Catholic on 07/28/2021 12:53:37 -------------------------------------------------------------------------------- Patient/Caregiver Education Details Patient Name: Date of Service: Gregory [name] LD 6/16/2023andnbsp12:30 PM Medical Record Number: [id number] Patient Account Number: [id number] Date of Birth/Gender: Treating RN: Jun 25, 1920 (88 y.o. [name] Primary Care Physician: Wylene Simmer Other Clinician: Referring Physician: Treating Physician/Extender: Roxy Cedar in Treatment: 12 Education Assessment Education Provided To: Patient Education Topics Provided Wound/Skin Impairment: Methods: Explain/Verbal Responses: Return demonstration correctly Electronic Signature(s) Signed: 07/28/2021 5:29:34 PM By: Dellie Catholic RN Entered By: Dellie Catholic on 07/28/2021 15:45:34 -------------------------------------------------------------------------------- Wound Assessment Details Patient Name: Date of Service: Gregory [name] RNO LD 07/28/2021 12:30 PM Medical Record Number: [id number] Patient Account Number: [id number] Date of Birth/Sex: Treating RN: 10-10-1920 (88 y.o. [name] Primary Care Laquanna Veazey: Wylene Simmer Other Clinician: Referring Paislynn Hegstrom: Treating Mackenzy Eisenberg/Extender: Roxy Cedar  in Treatment: 12 Wound Status Wound Number: 1 Primary Etiology: Arterial Insufficiency Ulcer Wound Location: Right T Great oe Wound Status: Open Wounding Event: Pressure Injury Comorbid History: Arrhythmia, Hypertension Date Acquired: 02/12/2021 Weeks Of Treatment:  12 Clustered Wound: No Photos Wound Measurements Length: (cm) 0.6 Width: (cm) 0.4 Depth: (cm) 0.1 Area: (cm) 0.188 Volume: (cm) 0.019 % Reduction in Area: 96% % Reduction in Volume: 96% Epithelialization: Large (67-100%) Tunneling: No Undermining: No Wound Description Classification: Full Thickness Without Exposed Support Structures Wound Margin: Flat and Intact Exudate Amount: Medium Exudate Type: Serosanguineous Exudate Color: red, brown Foul Odor After Cleansing: No Slough/Fibrino Yes Wound Bed Granulation Amount: Small (1-33%) Exposed Structure Granulation Quality: Red Fascia Exposed: No Necrotic Amount: Large (67-100%) Fat Layer (Subcutaneous Tissue) Exposed: Yes Necrotic Quality: Eschar Tendon Exposed: No Muscle Exposed: No Joint Exposed: No Bone Exposed: No Treatment Notes Wound #1 (Toe Great) Wound Laterality: Right Cleanser Soap and Water Discharge Instruction: May shower and wash wound with dial antibacterial soap and water prior to dressing change. Wound Cleanser Discharge Instruction: Cleanse the wound with wound cleanser or normal saline prior to applying Gregory [name] clean dressing using gauze sponges, not tissue or cotton balls. Peri-Wound Care Topical Gentamicin Discharge Instruction: As directed by physician Primary Dressing KerraCel Ag Gelling Fiber Dressing, 2x2 in (silver alginate) Discharge Instruction: Apply silver alginate to wound bed as instructed Secondary Dressing Woven Gauze Sponges 2x2 in Discharge Instruction: Apply over primary dressing as directed. Secured With Conforming Stretch Gauze Bandage, Sterile 2x75 (in/in) Discharge Instruction: Secure with stretch gauze as  directed. Compression Wrap Compression Stockings Add-Ons Electronic Signature(s) Signed: 07/28/2021 5:29:34 PM By: Dellie Catholic RN Entered By: Dellie Catholic on 07/28/2021 13:27:03 -------------------------------------------------------------------------------- Wound Assessment Details Patient Name: Date of Service: Gregory [name] RNO LD 07/28/2021 12:30 PM Medical Record Number: [id number] Patient Account Number: [id number] Date of Birth/Sex: Treating RN: 1920-09-09 (88 y.o. [name] Primary Care Fumi Guadron: Wylene Simmer Other Clinician: Referring Lorina Duffner: Treating Augusto Deckman/Extender: Roxy Cedar in Treatment: 12 Wound Status Wound Number: 2 Primary Etiology: Arterial Insufficiency Ulcer Wound Location: Left T Great oe Wound Status: Open Wounding Event: Pressure Injury Comorbid History: Arrhythmia, Hypertension Date Acquired: 02/12/2021 Weeks Of Treatment: 12 Clustered Wound: No Photos Wound Measurements Length: (cm) 0.3 Width: (cm) 0.2 Depth: (cm) 0.1 Area: (cm) 0.047 Volume: (cm) 0.005 % Reduction in Area: 98.8% % Reduction in Volume: 98.7% Epithelialization: None Tunneling: No Undermining: No Wound Description Classification: Unclassifiable Wound Margin: Flat and Intact Exudate Amount: Small Exudate Type: Serosanguineous Exudate Color: red, brown Foul Odor After Cleansing: No Slough/Fibrino Yes Wound Bed Granulation Amount: None Present (0%) Exposed Structure Necrotic Amount: Large (67-100%) Fascia Exposed: No Necrotic Quality: Eschar, Adherent Slough Fat Layer (Subcutaneous Tissue) Exposed: Yes Tendon Exposed: No Muscle Exposed: No Joint Exposed: No Bone Exposed: No Treatment Notes Wound #2 (Toe Great) Wound Laterality: Left Cleanser Soap and Water Discharge Instruction: May shower and wash wound with dial antibacterial soap and water prior to dressing change. Wound Cleanser Discharge Instruction: Cleanse  the wound with wound cleanser or normal saline prior to applying Gregory [name] clean dressing using gauze sponges, not tissue or cotton balls. Peri-Wound Care Topical Primary Dressing Iodosorb Gel 10 (gm) Tube Discharge Instruction: Apply to wound bed as instructed Secondary Dressing Woven Gauze Sponges 2x2 in Discharge Instruction: Apply over primary dressing as directed. Secured With Conforming Stretch Gauze Bandage, Sterile 2x75 (in/in) Discharge Instruction: Secure with stretch gauze as directed. Compression Wrap Compression Stockings Add-Ons Electronic Signature(s) Signed: 07/28/2021 5:29:34 PM By: Dellie Catholic RN Entered By: Dellie Catholic on 07/28/2021 13:28:49 -------------------------------------------------------------------------------- Wound Assessment Details Patient Name: Date of Service: Gregory [name], Gregory [name] RNO LD 07/28/2021 12:30 PM Medical Record Number: [id number] Patient Account Number: [id number] Date of  Birth/Sex: Treating RN: [birth year] (88 y.o. [name] Primary Care Oriel Rumbold: Wylene Simmer Other Clinician: Referring Athenia Rys: Treating Chauntelle Azpeitia/Extender: Roxy Cedar in Treatment: 12 Wound Status Wound Number: 4 Primary Etiology: Pressure Ulcer Wound Location: Right, Lateral Foot Wound Status: Open Wounding Event: Gradually Appeared Comorbid History: Arrhythmia, Hypertension Date Acquired: 04/12/2021 Weeks Of Treatment: 7 Clustered Wound: No Photos Wound Measurements Length: (cm) 0.1 Width: (cm) 0.1 Depth: (cm) 0.1 Area: (cm) 0.008 Volume: (cm) 0.001 % Reduction in Area: 83% % Reduction in Volume: 80% Epithelialization: Small (1-33%) Tunneling: No Undermining: No Wound Description Classification: Category/Stage II Wound Margin: Flat and Intact Exudate Amount: Small Exudate Type: Serosanguineous Exudate Color: red, brown Foul Odor After Cleansing: No Slough/Fibrino Yes Wound Bed Granulation Amount: Small  (1-33%) Exposed Structure Granulation Quality: Red Fascia Exposed: No Necrotic Amount: Large (67-100%) Fat Layer (Subcutaneous Tissue) Exposed: Yes Necrotic Quality: Eschar Tendon Exposed: No Muscle Exposed: No Joint Exposed: No Bone Exposed: No Treatment Notes Wound #4 (Foot) Wound Laterality: Right, Lateral Cleanser Soap and Water Discharge Instruction: May shower and wash wound with dial antibacterial soap and water prior to dressing change. Peri-Wound Care Topical Gentamicin Discharge Instruction: As directed by physician Primary Dressing KerraCel Ag Gelling Fiber Dressing, 2x2 in (silver alginate) Discharge Instruction: Apply silver alginate to wound bed as instructed Secondary Dressing ALLEVYN Gentle Border, 3x3 (in/in) Discharge Instruction: Apply over primary dressing as directed. Secured With Compression Wrap Compression Stockings Environmental education officer) Signed: 07/28/2021 5:29:34 PM By: Dellie Catholic RN Entered By: Dellie Catholic on 07/28/2021 13:29:15 -------------------------------------------------------------------------------- Wound Assessment Details Patient Name: Date of Service: Gregory [name] RNO LD 07/28/2021 12:30 PM Medical Record Number: [id number] Patient Account Number: [id number] Date of Birth/Sex: Treating RN: [birth year] (88 y.o. [name] Primary Care Liliana Brentlinger: Wylene Simmer Other Clinician: Referring Azura Tufaro: Treating Havard Radigan/Extender: Roxy Cedar in Treatment: 12 Wound Status Wound Number: 5 Primary Etiology: Pressure Ulcer Wound Location: Right, Medial T Great oe Wound Status: Open Wounding Event: Blister Comorbid History: Arrhythmia, Hypertension Date Acquired: 06/12/2021 Weeks Of Treatment: 5 Clustered Wound: No Photos Wound Measurements Length: (cm) 0.9 Width: (cm) 0.5 Depth: (cm) 0.1 Area: (cm) 0.353 Volume: (cm) 0.035 % Reduction in Area: -397.2% % Reduction in Volume:  -400% Epithelialization: None Tunneling: No Undermining: No Wound Description Classification: Category/Stage II Exudate Amount: Medium Exudate Type: Serosanguineous Exudate Color: red, brown Foul Odor After Cleansing: No Slough/Fibrino Yes Wound Bed Granulation Amount: Small (1-33%) Exposed Structure Granulation Quality: Red Fascia Exposed: No Necrotic Amount: Large (67-100%) Fat Layer (Subcutaneous Tissue) Exposed: Yes Necrotic Quality: Eschar, Adherent Slough Tendon Exposed: No Muscle Exposed: No Joint Exposed: No Bone Exposed: No Assessment Notes Blood Blister developed on Medial great toe (R) Treatment Notes Wound #5 (Toe Great) Wound Laterality: Right, Medial Cleanser Soap and Water Discharge Instruction: May shower and wash wound with dial antibacterial soap and water prior to dressing change. Peri-Wound Care Topical Gentamicin Discharge Instruction: As directed by physician Primary Dressing KerraCel Ag Gelling Fiber Dressing, 2x2 in (silver alginate) Discharge Instruction: Apply silver alginate to wound bed as instructed Secondary Dressing ALLEVYN Gentle Border, 3x3 (in/in) Discharge Instruction: Apply over primary dressing as directed. Secured With Compression Wrap Compression Stockings Environmental education officer) Signed: 07/28/2021 5:29:34 PM By: Dellie Catholic RN Entered By: Dellie Catholic on 07/28/2021 13:27:59 -------------------------------------------------------------------------------- Vitals Details Patient Name: Date of Service: Gregory [name], Gregory [name] RNO LD 07/28/2021 12:30 PM Medical Record Number: [id number] Patient Account Number: [id number] Date of Birth/Sex: Treating RN: 05-19-1920 (88 y.o. Gregory [name]) Dellie Catholic  Primary Care Metta Koranda: Wylene Simmer Other Clinician: Referring Monnica Saltsman: Treating Salma Walrond/Extender: Roxy Cedar in Treatment: 12 Vital Signs Time Taken: 12:51 Temperature (F): 97.9 Pulse (bpm):  80 Respiratory Rate (breaths/min): 18 Blood Pressure (mmHg): 117/67 Reference Range: 80 - 120 mg / dl Electronic Signature(s) Signed: 07/28/2021 5:29:34 PM By: Dellie Catholic RN Entered By: Dellie Catholic on 07/28/2021 12:53:17

## 2021-07-31 ENCOUNTER — Emergency Department (HOSPITAL_COMMUNITY): Payer: Medicare Other

## 2021-07-31 ENCOUNTER — Emergency Department (HOSPITAL_COMMUNITY)
Admission: EM | Admit: 2021-07-31 | Discharge: 2021-07-31 | Disposition: A | Discharge status: Home / Self Care | Payer: Medicare Other | Attending: Emergency Medicine | Admitting: Emergency Medicine

## 2021-07-31 ENCOUNTER — Encounter (HOSPITAL_COMMUNITY): Payer: Self-pay

## 2021-07-31 ENCOUNTER — Other Ambulatory Visit: Payer: Self-pay

## 2021-07-31 DIAGNOSIS — D696 Thrombocytopenia, unspecified: Secondary | ICD-10-CM | POA: Diagnosis not present

## 2021-07-31 DIAGNOSIS — N189 Chronic kidney disease, unspecified: Secondary | ICD-10-CM | POA: Insufficient documentation

## 2021-07-31 DIAGNOSIS — Z7902 Long term (current) use of antithrombotics/antiplatelets: Secondary | ICD-10-CM | POA: Insufficient documentation

## 2021-07-31 DIAGNOSIS — D649 Anemia, unspecified: Secondary | ICD-10-CM

## 2021-07-31 DIAGNOSIS — D631 Anemia in chronic kidney disease: Secondary | ICD-10-CM | POA: Insufficient documentation

## 2021-07-31 DIAGNOSIS — K9421 Gastrostomy hemorrhage: Secondary | ICD-10-CM | POA: Insufficient documentation

## 2021-07-31 LAB — CBC WITH DIFFERENTIAL/PLATELET
Abs Immature Granulocytes: 0.03 10*3/uL (ref 0.00–0.07)
Basophils Absolute: 0.1 10*3/uL (ref 0.0–0.1)
Basophils Relative: 1 %
Eosinophils Absolute: 0.5 10*3/uL (ref 0.0–0.5)
Eosinophils Relative: 5 %
HCT: 26.6 % — ABNORMAL LOW (ref 39.0–52.0)
Hemoglobin: 8.5 g/dL — ABNORMAL LOW (ref 13.0–17.0)
Immature Granulocytes: 0 %
Lymphocytes Relative: 10 %
Lymphs Abs: 1 10*3/uL (ref 0.7–4.0)
MCH: 31.3 pg (ref 26.0–34.0)
MCHC: 32 g/dL (ref 30.0–36.0)
MCV: 97.8 fL (ref 80.0–100.0)
Monocytes Absolute: 1.4 10*3/uL — ABNORMAL HIGH (ref 0.1–1.0)
Monocytes Relative: 14 %
Neutro Abs: 7.1 10*3/uL (ref 1.7–7.7)
Neutrophils Relative %: 70 %
Platelets: 132 10*3/uL — ABNORMAL LOW (ref 150–400)
RBC: 2.72 MIL/uL — ABNORMAL LOW (ref 4.22–5.81)
RDW: 19.8 % — ABNORMAL HIGH (ref 11.5–15.5)
WBC: 9.9 10*3/uL (ref 4.0–10.5)
nRBC: 0 % (ref 0.0–0.2)

## 2021-07-31 LAB — BASIC METABOLIC PANEL
Anion gap: 12 (ref 5–15)
BUN: 97 mg/dL — ABNORMAL HIGH (ref 8–23)
CO2: 22 mmol/L (ref 22–32)
Calcium: 9.7 mg/dL (ref 8.9–10.3)
Chloride: 103 mmol/L (ref 98–111)
Creatinine, Ser: 1.74 mg/dL — ABNORMAL HIGH (ref 0.61–1.24)
GFR, Estimated: 35 mL/min — ABNORMAL LOW (ref >60)
Glucose, Bld: 99 mg/dL (ref 70–99)
Potassium: 5 mmol/L (ref 3.5–5.1)
Sodium: 137 mmol/L (ref 135–145)

## 2021-07-31 MED ORDER — BACITRACIN ZINC 500 UNIT/GM EX OINT
TOPICAL_OINTMENT | Freq: Two times a day (BID) | CUTANEOUS | Status: DC
  Administered 2021-07-31: 1 via TOPICAL

## 2021-07-31 MED ORDER — DIATRIZOATE MEGLUMINE & SODIUM 66-10 % PO SOLN
30.0000 mL | Freq: Once | ORAL | Status: DC
  Filled 2021-07-31: qty 30

## 2021-07-31 MED ORDER — DIATRIZOATE MEGLUMINE & SODIUM 66-10 % PO SOLN
ORAL | Status: AC
  Administered 2021-07-31: 30 mL via GASTROSTOMY
  Filled 2021-07-31: qty 30

## 2021-07-31 NOTE — ED Notes (Signed)
Pt returned from xray

## 2021-07-31 NOTE — ED Provider Notes (Signed)
[facility] EMERGENCY DEPARTMENT Provider Note   CSN: [id number] Arrival date & time: 07/31/21  1424     History  No chief complaint on file.   Dajuan Turnley is a 88 y.o. male.  Patient presents with family, currently lives with his son and family has noticed small amount of irritation and bleeding around the G-tube site earlier today.  They have not appreciated this in the past.  No trauma, patient sleeps on his back and it has not been caught recently.  Patient is on Plavix but not on anticoagulant due to recurrent GI bleeds.  Patient is followed by wound care and home health.  Patient was discharged on April 14 from the hospital.  Patient denies any abdominal pain, vomiting.  Family states the G-tube has been working well and he is 100% dependent on it for medications and feeding.  Patient had it placed January 9 and male for dysphagia.  Patient follows with Dr. Onnie Graham in [place] and Dr. [place] cardiology locally [facility].       Home Medications Prior to Admission medications   Medication Sig Start Date End Date Taking? Authorizing Provider  atorvastatin (LIPITOR) 40 MG tablet Take 40 mg by mouth daily.    [provider]  chlorhexidine (PERIDEX) 0.12 % solution 15 mLs by Mouth Rinse route 2 (two) times daily. 04/05/21   Lajean Manes, MD  clopidogrel (PLAVIX) 75 MG tablet Place 1 tablet (75 mg total) into feeding tube daily. 05/26/21   Arrien, Jimmy Picket, MD  famotidine (PEPCID) 40 MG/5ML suspension Place 2.5 mLs (20 mg total) into feeding tube at bedtime. 05/26/21   Arrien, Jimmy Picket, MD  furosemide (LASIX) 20 MG tablet Take 1 tablet Per J Tube daily as needed for edema or fluid (Take as needed for shortness of breath, leg swelling or weight gain 3 lbs in 24-48 hrs to 5 lbs in 7 days.). 05/26/21   Arrien, Jimmy Picket, MD  levothyroxine (SYNTHROID) 75 MCG tablet Place 1 tablet (75 mcg total) into feeding tube daily at 6 (six) AM. 04/06/21   Lajean Manes, MD  melatonin 5 MG TABS Place 1 tablet (5 mg total) into feeding tube at bedtime as needed. Patient taking differently: Place 5 mg into feeding tube at bedtime as needed (sleep). 04/14/21   Love, Ivan Anchors, PA-C  Nutritional Supplements (FEEDING SUPPLEMENT, NEPRO CARB STEADY,) LIQD Place 315 mLs into feeding tube 3 (three) times daily. Patient taking differently: Place 395 mLs into feeding tube 3 (three) times daily. 04/05/21   Lajean Manes, MD  pantoprazole (PROTONIX) 40 MG tablet Take 40 mg by mouth daily. Liquid...    [provider]  Probiotic Product (PROBIOTIC PO) Give 1 capsule by tube daily.    [provider]  senna-docusate (SENOKOT-S) 8.6-50 MG tablet Place 2 tablets into feeding tube at bedtime. 04/14/21   Love, Ivan Anchors, PA-C  sucralfate (CARAFATE) 1 GM/10ML suspension Place 10 mLs (1 g total) into feeding tube every 8 (eight) hours. 04/14/21   Love, Ivan Anchors, PA-C  Water For Irrigation, Sterile (FREE WATER) SOLN Place 300 mLs into feeding tube 4 (four) times daily. 04/05/21   Lajean Manes, MD      Allergies    Cefepime, Amlodipine besylate, and Penicillins    Review of Systems   Review of Systems  Unable to perform ROS: Age    Physical Exam Updated Vital Signs BP (!) 131/95   Pulse 69   Temp 98 F (36.7 C) (Oral)  Resp 18   SpO2 99%  Physical Exam Vitals and nursing note reviewed.  Constitutional:      General: He is not in acute distress.    Appearance: He is well-developed.  HENT:     Head: Normocephalic and atraumatic.     Mouth/Throat:     Mouth: Mucous membranes are moist.  Eyes:     General:        Right eye: No discharge.        Left eye: No discharge.  Neck:     Trachea: No tracheal deviation.  Cardiovascular:     Rate and Rhythm: Normal rate.  Pulmonary:     Effort: Pulmonary effort is normal.  Abdominal:     General: There is no distension.     Palpations: Abdomen is soft.     Tenderness: There is no abdominal tenderness.  There is no guarding.  Musculoskeletal:     Cervical back: Normal range of motion and neck supple. No rigidity.  Skin:    General: Skin is warm.     Capillary Refill: Capillary refill takes less than 2 seconds.     Findings: No rash.     Comments: Patient has minimal irritation erythema around G-tube site without induration, warmth or fluctuance.  No purulent drainage.  With pressure small amount of blood from opening.  G-tube tubing intact.  Neurological:     General: No focal deficit present.     Mental Status: He is alert.  Psychiatric:     Comments: Patient frail, soft-spoken, difficulty with hearing.     ED Results / Procedures / Treatments   Labs (all labs ordered are listed, but only abnormal results are displayed) Labs Reviewed  CBC WITH DIFFERENTIAL/PLATELET - Abnormal; Notable for the following components:      Result Value   RBC 2.72 (*)    Hemoglobin 8.5 (*)    HCT 26.6 (*)    RDW 19.8 (*)    Platelets 132 (*)    Monocytes Absolute 1.4 (*)    All other components within normal limits  BASIC METABOLIC PANEL - Abnormal; Notable for the following components:   BUN 97 (*)    Creatinine, Ser 1.74 (*)    GFR, Estimated 35 (*)    All other components within normal limits    EKG None  Radiology DG ABDOMEN PEG TUBE LOCATION  Result Date: 07/31/2021 CLINICAL DATA:  Check gastrostomy catheter placement EXAM: ABDOMEN - 1 VIEW COMPARISON:  None Available. FINDINGS: Contrast material was injected an indwelling gastrostomy catheter. Free flow of contrast into the stomach is seen. Degenerative changes of lumbar spine are seen. IMPRESSION: Gastrostomy catheter in satisfactory position. Electronically Signed   By: Inez Catalina M.D.   On: 07/31/2021 20:01    Procedures Procedures    Medications Ordered in ED Medications  bacitracin ointment (has no administration in time range)  diatrizoate meglumine-sodium (GASTROGRAFIN) 66-10 % solution 30 mL (has no administration in  time range)  diatrizoate meglumine-sodium (GASTROGRAFIN) 66-10 % solution (30 mLs PEG Tube Given 07/31/21 1952)    ED Course/ Medical Decision Making/ A&P                           Medical Decision Making Amount and/or Complexity of Data Reviewed Labs: ordered. Radiology: ordered.  Risk OTC drugs. Prescription drug management.   Elderly patient with multiple medical problems on his history presents primarily for small amount of blood and irritation  around the G-tube site.  Discussed differential diagnosis with family, no witnessed trauma or dislodgment of the tube.  Fortunately that she has been working well as he is dependent upon it.  No signs of significant infection on exam, no fevers, no purulence, no cellulitis, no signs of abscess near the skin and nontender without guarding.  Medical records reviewed and patient gets regular wound care for distal lower extremity heel/foot wounds from chronic vascular sufficiency.  Patient had discharge summary on April 14 for which I reviewed discussing admission for CHF, anemia requiring 1 unit packed red blood cells, chronic atrial fibrillation.  Last hemoglobin reviewed May 26 was 9.2.  Plan for blood work check for signs of anemia, check platelets, compare blood work to previous.  Discussed with x-ray technician for x-ray with contrast to ensure tubing intact.  Wound care provided, cleaning with alcohol and topical antibiotics.  X-ray results from radiology independently reviewed showing tube in appropriate position no evidence of leaking.  Blood work results independently reviewed and compared to most recent.  Patient has chronic anemia hemoglobin overall stable 8.5 no active bleeding in the ER.  Patient's platelets 132 reviewed.  Patient has mild acute on chronic kidney function creatinine 1.74.  Patient stable for close outpatient follow-up.  Plan to follow-up with local general surgeon in case adjustments to G-tube or further investigation  required.        Final Clinical Impression(s) / ED Diagnoses Final diagnoses:  Bleeding from gastrostomy tube site [facility])  Chronic anemia  Chronic renal failure, unspecified CKD stage  Thrombocytopenia [facility])    Rx / DC Orders ED Discharge Orders     None         Elnora Morrison, MD 07/31/21 2110

## 2021-07-31 NOTE — ED Notes (Signed)
Pt g tube site cleaned and bacitracin placed

## 2021-07-31 NOTE — ED Provider Triage Note (Signed)
Emergency Medicine Provider Triage Evaluation Note  Gregory [name] , a 88 y.o. male  was evaluated in triage.  Pt complains of leaking around his PEG tube.  He has had G tube in place since January of this year.  Family member change the dressing today and noticed that there was some discharge surrounding it.  They were able to use the G tube successfully today.  Review of Systems  Positive: Leaking G tube Negative: Pain  Physical Exam  There were no vitals taken for this visit. Gen:   Awake, no distress   Resp:  Normal effort  MSK:   Moves extremities without difficulty  Other:  Tube in place in abdomen  Medical Decision Making  Medically screening exam initiated at 2:58 PM.  Appropriate orders placed.  Trentan Trippe was informed that the remainder of the evaluation will be completed by another provider, this initial triage assessment does not replace that evaluation, and the importance of remaining in the ED until their evaluation is complete.    Delia Heady, PA-C 07/31/21 1500

## 2021-07-31 NOTE — ED Notes (Signed)
Pt taken to xray 

## 2021-07-31 NOTE — Discharge Instructions (Addendum)
Hold pressure for any bleeding. Call local surgeon office to arrange follow-up CNS availability. Follow-up closely with your doctors for reassessment and neck steps in management of G-tube. Apply topical antibiotics twice daily. Return for persistent fevers, concerning pain, vomiting or new concerns.

## 2021-07-31 NOTE — ED Triage Notes (Signed)
Patient here with oozing around peg tube today. Has worked as normal and patient denies pain, placed in January

## 2021-07-31 NOTE — ED Notes (Signed)
Patient verbalizes understanding of discharge instructions. Opportunity for questioning and answers were provided. Armband removed by staff, pt discharged from ED via wheelchair.  

## 2021-08-02 ENCOUNTER — Ambulatory Visit: Payer: Medicare Other | Admitting: Podiatry

## 2021-08-07 ENCOUNTER — Encounter (HOSPITAL_BASED_OUTPATIENT_CLINIC_OR_DEPARTMENT_OTHER): Payer: Medicare Other | Admitting: General Surgery

## 2021-08-07 DIAGNOSIS — L97512 Non-pressure chronic ulcer of other part of right foot with fat layer exposed: Secondary | ICD-10-CM | POA: Diagnosis not present

## 2021-08-10 ENCOUNTER — Ambulatory Visit (INDEPENDENT_AMBULATORY_CARE_PROVIDER_SITE_OTHER): Payer: Medicare Other | Admitting: Podiatry

## 2021-08-10 DIAGNOSIS — M79675 Pain in left toe(s): Secondary | ICD-10-CM | POA: Diagnosis not present

## 2021-08-10 DIAGNOSIS — M79674 Pain in right toe(s): Secondary | ICD-10-CM | POA: Diagnosis not present

## 2021-08-10 DIAGNOSIS — B351 Tinea unguium: Secondary | ICD-10-CM | POA: Diagnosis not present

## 2021-08-10 DIAGNOSIS — L601 Onycholysis: Secondary | ICD-10-CM | POA: Diagnosis not present

## 2021-08-13 NOTE — Progress Notes (Signed)
  Subjective:  Patient ID: Gregory [name], male    DOB: 11-Dec-1920,  MRN: [id number]  Chief Complaint  Patient presents with   Nail Problem     (New Patient) Diagnosis: Routine foot care, nail trim Referring Provider: Fredirick Maudlin    88 y.o. male presents with the above complaint. History confirmed with patient.  Referred by the wound care center they are working on ulcerations of both feet including the toes.  The nails are thickened elongated and causing difficulty, the right hallux nail has been loose  Objective:  Physical Exam: warm, good capillary refill, normal DP and PT pulses, and ulcerations both hallux is noted with fibrogranular wound bed.. Left Foot: dystrophic yellowed discolored nail plates with subungual debris Right Foot: dystrophic yellowed discolored nail plates with subungual debris right hallux nail is loose  Assessment:   1. Pain due to onychomycosis of toenails of both feet   2. Onycholysis      Plan:  Patient was evaluated and treated and all questions answered.  Discussed the etiology and treatment options for the condition in detail with the patient. Educated patient on the topical and oral treatment options for mycotic nails. Recommended debridement of the nails today. Sharp and mechanical debridement performed of all painful and mycotic nails today. Nails debrided in length and thickness using a nail nipper to level of comfort. Discussed treatment options including appropriate shoe gear. Follow up as needed for painful nails.   Right hallux was noted to be loose enough to require avulsion.  I did not perform a matricectomy.  I performed this following local anesthetic block with 1.5 cc each of 2% lidocaine and 0.5% Marcaine plain.  The nail plate was then avulsed using a Soil scientist.  It was dressed with Silvadene and a bandage.  He tolerated this well  Return if symptoms worsen or fail to improve.

## 2021-08-14 ENCOUNTER — Other Ambulatory Visit: Payer: Self-pay

## 2021-08-14 ENCOUNTER — Inpatient Hospital Stay
Admission: EM | Admit: 2021-08-14 | Discharge: 2021-08-16 | DRG: 291 | Disposition: A | Discharge status: Home Health | Payer: Medicare Other | Attending: Internal Medicine | Admitting: Internal Medicine

## 2021-08-14 ENCOUNTER — Encounter: Payer: Self-pay | Admitting: Intensive Care

## 2021-08-14 ENCOUNTER — Emergency Department: Payer: Medicare Other

## 2021-08-14 DIAGNOSIS — Z85828 Personal history of other malignant neoplasm of skin: Secondary | ICD-10-CM | POA: Diagnosis not present

## 2021-08-14 DIAGNOSIS — Z79899 Other long term (current) drug therapy: Secondary | ICD-10-CM

## 2021-08-14 DIAGNOSIS — R1310 Dysphagia, unspecified: Secondary | ICD-10-CM

## 2021-08-14 DIAGNOSIS — E785 Hyperlipidemia, unspecified: Secondary | ICD-10-CM | POA: Diagnosis present

## 2021-08-14 DIAGNOSIS — Z950 Presence of cardiac pacemaker: Secondary | ICD-10-CM | POA: Diagnosis not present

## 2021-08-14 DIAGNOSIS — E039 Hypothyroidism, unspecified: Secondary | ICD-10-CM | POA: Diagnosis present

## 2021-08-14 DIAGNOSIS — Z8249 Family history of ischemic heart disease and other diseases of the circulatory system: Secondary | ICD-10-CM | POA: Diagnosis not present

## 2021-08-14 DIAGNOSIS — I214 Non-ST elevation (NSTEMI) myocardial infarction: Secondary | ICD-10-CM | POA: Insufficient documentation

## 2021-08-14 DIAGNOSIS — Z20822 Contact with and (suspected) exposure to covid-19: Secondary | ICD-10-CM | POA: Diagnosis present

## 2021-08-14 DIAGNOSIS — I5033 Acute on chronic diastolic (congestive) heart failure: Secondary | ICD-10-CM | POA: Diagnosis present

## 2021-08-14 DIAGNOSIS — I480 Paroxysmal atrial fibrillation: Secondary | ICD-10-CM | POA: Diagnosis present

## 2021-08-14 DIAGNOSIS — I10 Essential (primary) hypertension: Secondary | ICD-10-CM | POA: Diagnosis present

## 2021-08-14 DIAGNOSIS — Z7401 Bed confinement status: Secondary | ICD-10-CM | POA: Diagnosis not present

## 2021-08-14 DIAGNOSIS — D696 Thrombocytopenia, unspecified: Secondary | ICD-10-CM | POA: Diagnosis present

## 2021-08-14 DIAGNOSIS — Z806 Family history of leukemia: Secondary | ICD-10-CM

## 2021-08-14 DIAGNOSIS — K219 Gastro-esophageal reflux disease without esophagitis: Secondary | ICD-10-CM | POA: Diagnosis present

## 2021-08-14 DIAGNOSIS — I248 Other forms of acute ischemic heart disease: Secondary | ICD-10-CM | POA: Diagnosis present

## 2021-08-14 DIAGNOSIS — R778 Other specified abnormalities of plasma proteins: Secondary | ICD-10-CM

## 2021-08-14 DIAGNOSIS — D509 Iron deficiency anemia, unspecified: Secondary | ICD-10-CM | POA: Diagnosis present

## 2021-08-14 DIAGNOSIS — I509 Heart failure, unspecified: Secondary | ICD-10-CM

## 2021-08-14 DIAGNOSIS — Z931 Gastrostomy status: Secondary | ICD-10-CM | POA: Diagnosis not present

## 2021-08-14 DIAGNOSIS — N1831 Chronic kidney disease, stage 3a: Secondary | ICD-10-CM | POA: Diagnosis present

## 2021-08-14 DIAGNOSIS — I130 Hypertensive heart and chronic kidney disease with heart failure and stage 1 through stage 4 chronic kidney disease, or unspecified chronic kidney disease: Secondary | ICD-10-CM | POA: Diagnosis present

## 2021-08-14 DIAGNOSIS — J9811 Atelectasis: Secondary | ICD-10-CM | POA: Diagnosis present

## 2021-08-14 DIAGNOSIS — Z79890 Hormone replacement therapy (postmenopausal): Secondary | ICD-10-CM

## 2021-08-14 DIAGNOSIS — E440 Moderate protein-calorie malnutrition: Secondary | ICD-10-CM | POA: Diagnosis present

## 2021-08-14 DIAGNOSIS — I5032 Chronic diastolic (congestive) heart failure: Secondary | ICD-10-CM | POA: Diagnosis not present

## 2021-08-14 DIAGNOSIS — Z6821 Body mass index (BMI) 21.0-21.9, adult: Secondary | ICD-10-CM | POA: Diagnosis not present

## 2021-08-14 DIAGNOSIS — I5A Non-ischemic myocardial injury (non-traumatic): Secondary | ICD-10-CM | POA: Diagnosis present

## 2021-08-14 DIAGNOSIS — R531 Weakness: Secondary | ICD-10-CM

## 2021-08-14 DIAGNOSIS — Z7902 Long term (current) use of antithrombotics/antiplatelets: Secondary | ICD-10-CM | POA: Diagnosis not present

## 2021-08-14 LAB — COMPREHENSIVE METABOLIC PANEL
ALT: 24 U/L (ref 0–44)
AST: 41 U/L (ref 15–41)
Albumin: 3.6 g/dL (ref 3.5–5.0)
Alkaline Phosphatase: 63 U/L (ref 38–126)
Anion gap: 8 (ref 5–15)
BUN: 91 mg/dL — ABNORMAL HIGH (ref 8–23)
CO2: 21 mmol/L — ABNORMAL LOW (ref 22–32)
Calcium: 9.5 mg/dL (ref 8.9–10.3)
Chloride: 114 mmol/L — ABNORMAL HIGH (ref 98–111)
Creatinine, Ser: 1.66 mg/dL — ABNORMAL HIGH (ref 0.61–1.24)
GFR, Estimated: 37 mL/min — ABNORMAL LOW (ref >60)
Glucose, Bld: 100 mg/dL — ABNORMAL HIGH (ref 70–99)
Potassium: 4.4 mmol/L (ref 3.5–5.1)
Sodium: 143 mmol/L (ref 135–145)
Total Bilirubin: 0.8 mg/dL (ref 0.3–1.2)
Total Protein: 7.6 g/dL (ref 6.5–8.1)

## 2021-08-14 LAB — CBC WITH DIFFERENTIAL/PLATELET
Abs Immature Granulocytes: 0.04 10*3/uL (ref 0.00–0.07)
Basophils Absolute: 0.1 10*3/uL (ref 0.0–0.1)
Basophils Relative: 1 %
Eosinophils Absolute: 0.4 10*3/uL (ref 0.0–0.5)
Eosinophils Relative: 4 %
HCT: 26.5 % — ABNORMAL LOW (ref 39.0–52.0)
Hemoglobin: 8.2 g/dL — ABNORMAL LOW (ref 13.0–17.0)
Immature Granulocytes: 0 %
Lymphocytes Relative: 11 %
Lymphs Abs: 1 10*3/uL (ref 0.7–4.0)
MCH: 30.1 pg (ref 26.0–34.0)
MCHC: 30.9 g/dL (ref 30.0–36.0)
MCV: 97.4 fL (ref 80.0–100.0)
Monocytes Absolute: 1.1 10*3/uL — ABNORMAL HIGH (ref 0.1–1.0)
Monocytes Relative: 12 %
Neutro Abs: 6.7 10*3/uL (ref 1.7–7.7)
Neutrophils Relative %: 72 %
Platelets: 127 10*3/uL — ABNORMAL LOW (ref 150–400)
RBC: 2.72 MIL/uL — ABNORMAL LOW (ref 4.22–5.81)
RDW: 20.5 % — ABNORMAL HIGH (ref 11.5–15.5)
WBC: 9.3 10*3/uL (ref 4.0–10.5)
nRBC: 0 % (ref 0.0–0.2)

## 2021-08-14 LAB — URINALYSIS, ROUTINE W REFLEX MICROSCOPIC
Bilirubin Urine: NEGATIVE
Glucose, UA: NEGATIVE mg/dL
Hgb urine dipstick: NEGATIVE
Ketones, ur: NEGATIVE mg/dL
Leukocytes,Ua: NEGATIVE
Nitrite: NEGATIVE
Protein, ur: NEGATIVE mg/dL
Specific Gravity, Urine: 1.012 (ref 1.005–1.030)
pH: 5 (ref 5.0–8.0)

## 2021-08-14 LAB — TROPONIN I (HIGH SENSITIVITY)
Troponin I (High Sensitivity): 155 ng/L (ref <18)
Troponin I (High Sensitivity): 149 ng/L (ref <18)
Troponin I (High Sensitivity): 159 ng/L (ref <18)

## 2021-08-14 LAB — PROTIME-INR
INR: 1.3 — ABNORMAL HIGH (ref 0.8–1.2)
Prothrombin Time: 16.5 seconds — ABNORMAL HIGH (ref 11.4–15.2)

## 2021-08-14 LAB — SARS CORONAVIRUS 2 BY RT PCR: SARS Coronavirus 2 by RT PCR: NEGATIVE

## 2021-08-14 LAB — BRAIN NATRIURETIC PEPTIDE: B Natriuretic Peptide: 1034.7 pg/mL — ABNORMAL HIGH (ref 0.0–100.0)

## 2021-08-14 MED ORDER — CHLORHEXIDINE GLUCONATE 0.12 % MT SOLN
15.0000 mL | Freq: Two times a day (BID) | OROMUCOSAL | Status: DC
  Administered 2021-08-15 – 2021-08-16 (×3): 15 mL via OROMUCOSAL
  Filled 2021-08-14 (×2): qty 15

## 2021-08-14 MED ORDER — SENNOSIDES-DOCUSATE SODIUM 8.6-50 MG PO TABS
2.0000 | ORAL_TABLET | Freq: Every day | ORAL | Status: DC
  Administered 2021-08-14 – 2021-08-15 (×2): 2
  Filled 2021-08-14 (×2): qty 2

## 2021-08-14 MED ORDER — HYDRALAZINE HCL 20 MG/ML IJ SOLN
5.0000 mg | INTRAMUSCULAR | Status: DC | PRN
Start: 2021-08-14 — End: 2021-08-16

## 2021-08-14 MED ORDER — ASPIRIN 81 MG PO TBEC: 81.0000 mg | DELAYED_RELEASE_TABLET | Freq: Every day | ORAL | Status: DC

## 2021-08-14 MED ORDER — ACETAMINOPHEN 325 MG PO TABS: 650.0000 mg | ORAL_TABLET | Freq: Four times a day (QID) | ORAL | Status: DC | PRN

## 2021-08-14 MED ORDER — PROBIOTIC PO PACK: PACK | Freq: Every day | ORAL | Status: DC

## 2021-08-14 MED ORDER — ALBUTEROL SULFATE (2.5 MG/3ML) 0.083% IN NEBU: 3.0000 mL | INHALATION_SOLUTION | RESPIRATORY_TRACT | Status: DC | PRN

## 2021-08-14 MED ORDER — FUROSEMIDE 40 MG PO TABS
20.0000 mg | ORAL_TABLET | Freq: Every day | ORAL | Status: DC
Start: 2021-08-14 — End: 2021-08-14

## 2021-08-14 MED ORDER — DM-GUAIFENESIN ER 30-600 MG PO TB12: 1.0000 | ORAL_TABLET | Freq: Two times a day (BID) | ORAL | Status: DC | PRN

## 2021-08-14 MED ORDER — NITROGLYCERIN 0.4 MG SL SUBL: 0.4000 mg | SUBLINGUAL_TABLET | SUBLINGUAL | Status: DC | PRN

## 2021-08-14 MED ORDER — ASPIRIN 81 MG PO CHEW
324.0000 mg | CHEWABLE_TABLET | Freq: Once | ORAL | Status: DC
  Filled 2021-08-14: qty 4

## 2021-08-14 MED ORDER — FERROUS SULFATE 220 (44 FE) MG/5ML PO ELIX
300.0000 mg | ORAL_SOLUTION | Freq: Every day | ORAL | Status: DC
  Administered 2021-08-14 – 2021-08-16 (×3): 300 mg
  Filled 2021-08-14: qty 6.82
  Filled 2021-08-14: qty 6.9
  Filled 2021-08-14: qty 6.82
  Filled 2021-08-14: qty 6.9

## 2021-08-14 MED ORDER — FLORANEX PO PACK
1.0000 g | PACK | Freq: Three times a day (TID) | ORAL | Status: DC
  Administered 2021-08-14 – 2021-08-16 (×5): 1 g
  Filled 2021-08-14 (×9): qty 1

## 2021-08-14 MED ORDER — NEPRO/CARBSTEADY PO LIQD
395.0000 mL | Freq: Three times a day (TID) | ORAL | Status: DC
  Administered 2021-08-14 – 2021-08-15 (×3): 395 mL

## 2021-08-14 MED ORDER — FUROSEMIDE 10 MG/ML IJ SOLN: 40.0000 mg | Freq: Once | INTRAMUSCULAR | Status: DC

## 2021-08-14 MED ORDER — ENOXAPARIN SODIUM 30 MG/0.3ML IJ SOSY
30.0000 mg | PREFILLED_SYRINGE | Freq: Every day | INTRAMUSCULAR | Status: DC
  Administered 2021-08-14: 30 mg via SUBCUTANEOUS
  Filled 2021-08-14: qty 0.3

## 2021-08-14 MED ORDER — ASPIRIN 81 MG PO CHEW
324.0000 mg | CHEWABLE_TABLET | Freq: Once | ORAL | Status: AC
Start: 2021-08-14 — End: 2021-08-14
  Administered 2021-08-14: 324 mg

## 2021-08-14 MED ORDER — ONDANSETRON HCL 4 MG/2ML IJ SOLN: 4.0000 mg | Freq: Three times a day (TID) | INTRAMUSCULAR | Status: DC | PRN

## 2021-08-14 MED ORDER — FUROSEMIDE 10 MG/ML IJ SOLN
20.0000 mg | Freq: Two times a day (BID) | INTRAMUSCULAR | Status: DC
  Administered 2021-08-14 – 2021-08-15 (×2): 20 mg via INTRAVENOUS
  Filled 2021-08-14 (×2): qty 4

## 2021-08-14 MED ORDER — SILVER SULFADIAZINE 1 % EX CREA
TOPICAL_CREAM | Freq: Every day | CUTANEOUS | Status: DC
  Filled 2021-08-14: qty 85

## 2021-08-14 MED ORDER — ATORVASTATIN CALCIUM 20 MG PO TABS
40.0000 mg | ORAL_TABLET | Freq: Every day | ORAL | Status: DC
Start: 2021-08-15 — End: 2021-08-16
  Administered 2021-08-15 – 2021-08-16 (×2): 40 mg
  Filled 2021-08-14 (×2): qty 2

## 2021-08-14 MED ORDER — LEVOTHYROXINE SODIUM 50 MCG PO TABS
75.0000 ug | ORAL_TABLET | Freq: Every day | ORAL | Status: DC
  Administered 2021-08-16: 75 ug
  Filled 2021-08-14 (×2): qty 1

## 2021-08-14 MED ORDER — CLOPIDOGREL BISULFATE 75 MG PO TABS
75.0000 mg | ORAL_TABLET | Freq: Every day | ORAL | Status: DC
Start: 2021-08-14 — End: 2021-08-16
  Administered 2021-08-14 – 2021-08-16 (×3): 75 mg
  Filled 2021-08-14 (×3): qty 1

## 2021-08-14 MED ORDER — FAMOTIDINE 40 MG/5ML PO SUSR
20.0000 mg | Freq: Every day | ORAL | Status: DC
  Administered 2021-08-14 – 2021-08-15 (×2): 20 mg
  Filled 2021-08-14 (×3): qty 2.5

## 2021-08-14 MED ORDER — MELATONIN 5 MG PO TABS
5.0000 mg | ORAL_TABLET | Freq: Every evening | ORAL | Status: DC | PRN
  Administered 2021-08-15: 5 mg
  Filled 2021-08-14: qty 1

## 2021-08-14 MED ORDER — MELATONIN 5 MG PO TABS
5.0000 mg | ORAL_TABLET | Freq: Once | ORAL | Status: AC
  Administered 2021-08-14: 5 mg
  Filled 2021-08-14: qty 1

## 2021-08-14 MED ORDER — FREE WATER
300.0000 mL | Freq: Four times a day (QID) | Status: DC
Start: 2021-08-14 — End: 2021-08-15
  Administered 2021-08-14 – 2021-08-15 (×2): 300 mL
  Filled 2021-08-14 (×6): qty 300

## 2021-08-14 NOTE — Assessment & Plan Note (Signed)
Synthroid 

## 2021-08-14 NOTE — Assessment & Plan Note (Signed)
-  Nutritional supplement -Consult nutrition

## 2021-08-14 NOTE — Assessment & Plan Note (Signed)
Myocardial injury due to CHF exacerbation: Troponin level 155, 149, no chest pain.  -324 mg of aspirin was given -Continue home Plavix, Lipitor -As needed nitroglycerin -Trend troponin -Check A1c, FLP

## 2021-08-14 NOTE — ED Provider Notes (Signed)
-----------------------------------------   7:30 PM on 08/14/2021 ----------------------------------------- Was called to see the patient because the patient's PEG tube seem to be coming out.  I confirmed that it was indeed coming out.  New PEG tube was found.  The patient had this placed in January reportedly so the tract was mature.  The PEG tube was removed the balloon was down PEG tube was tested and balloon was deflated and replaced without any difficulty whatsoever stomach contents refluxed into the PEG tube.  Blood was blown up in PEG tube is in place.  No difficulty was found.   Nena Polio, MD 08/14/21 [phone]

## 2021-08-14 NOTE — Assessment & Plan Note (Signed)
Renal function close to baseline.  Recent baseline creatinine 1.4-1.7.  His creatinine is 1.66, BUN 91. -Follow-up with BMP

## 2021-08-14 NOTE — Assessment & Plan Note (Signed)
Lipitor 

## 2021-08-14 NOTE — H&P (Addendum)
History and Physical    Gregory [name] MKL:[id number] DOB: [birth year] DOA: 08/14/2021  Referring MD/NP/PA:   PCP: Orpah Greek, DO   Patient coming from:  The patient is coming from home.    Chief Complaint: Generalized weakness, SOB  HPI: Gregory [name] is a 88 y.o. male with medical history significant of hypertension, hyperlipidemia, GERD, hypothyroidism, pacemaker placement, atrial fibrillation s/p of ablation, not on anticoagulants, dCHF, CKD-3A, anemia, dysphagia (s/p for G-tube placement), PVD, s/p of TAVR, GI bleeding, bedbound, who presents with generalized weakness and SOB.  Per patient and his daughter at the bedside, patient has been having shortness of breath for about 5 days, which has been progressively worsening.  Patient has cough with clear mucus production.  No chest pain.  No fever or chills.  Patient has generalized weakness, which is whole-body weak, no unilateral numbness or tinglings in extremities.  No facial droop or slurred speech.  Patient does not have nausea, vomiting, diarrhea or abdominal pain.  No symptoms of UTI.  Patient had toenails removed in both great toes recently.  Patient has been following up with wound care.  He completed a course of Levaquin yesterday.  Data reviewed independently and ED Course: pt was found to have BNP 1034, troponin level 155, 149, renal function close to baseline, temperature normal, blood pressure 108/46, RR 2 6, HR 88, oxygen sat 95% on room air.  Chest x-ray showed bilateral basilar edema.  Patient is admitted to telemetry bed as inpatient.   EKG: I have personally reviewed.  Seems to be paced rhythm, QTc 506  Review of Systems:   General: no fevers, chills, no body weight gain, has poor appetite, has fatigue HEENT: no blurry vision, hearing changes or sore throat Respiratory: has dyspnea, coughing, no wheezing CV: no chest pain, no palpitations GI: no nausea, vomiting, abdominal pain, diarrhea,  constipation GU: no dysuria, burning on urination, increased urinary frequency, hematuria  Ext: has trace leg edema Neuro: no unilateral weakness, numbness, or tingling, no vision change or hearing loss Skin: s/p of bilateral great toenail removal  MSK: No muscle spasm, no deformity, no limitation of range of movement in spin Heme: No easy bruising.  Travel history: No recent long distant travel.   Allergy:  Allergies  Allergen Reactions   Cefepime Other (See Comments)    Severe myoclonus for cefepime- don't take again-    Amlodipine Besylate     Other reaction(s): edema   Penicillins Hives and Rash    Past Medical History:  Diagnosis Date   Atrial flutter (HCC)    s/p ablation   Cancer ([place])    skin cancer   Cataracts, bilateral    Hypertension    Neuromuscular disorder ([place])    Pacemaker 2010   Thyroid disease     Past Surgical History:  Procedure Laterality Date   APPENDECTOMY     COLONOSCOPY     EYE SURGERY Bilateral    cataract surgery   HERNIA REPAIR Right    INGUINAL HERNIA REPAIR Left 10/12/2013   Procedure: LEFT  INGUINAL HERNIA REPAIR;  Surgeon: Odis Hollingshead, MD;  Location: [facility];  Service: General;  Laterality: Left;   INSERTION OF MESH Left 10/12/2013   Procedure: INSERTION OF MESH;  Surgeon: Odis Hollingshead, MD;  Location: [facility];  Service: General;  Laterality: Left;   PEG TUBE PLACEMENT Left     Social History:  reports that he has never smoked. He has never used smokeless tobacco. He reports  that he does not drink alcohol and does not use drugs.  Family History:  Family History  Problem Relation Age of Onset   Heart disease Mother    Cancer Father        leukemia     Prior to Admission medications   Medication Sig Start Date End Date Taking? Authorizing Provider  atorvastatin (LIPITOR) 40 MG tablet Take 40 mg by mouth daily.    [provider]  chlorhexidine (PERIDEX) 0.12 % solution 15 mLs by Mouth Rinse route 2 (two) times  daily. 04/05/21   Lajean Manes, MD  clopidogrel (PLAVIX) 75 MG tablet Place 1 tablet (75 mg total) into feeding tube daily. 05/26/21   Arrien, Jimmy Picket, MD  famotidine (PEPCID) 40 MG/5ML suspension Place 2.5 mLs (20 mg total) into feeding tube at bedtime. 05/26/21   Arrien, Jimmy Picket, MD  ferrous sulfate 300 (60 Fe) MG/5ML syrup Take by mouth. 07/07/21 07/07/22  [provider]  furosemide (LASIX) 20 MG tablet Take 1 tablet Per J Tube daily as needed for edema or fluid (Take as needed for shortness of breath, leg swelling or weight gain 3 lbs in 24-48 hrs to 5 lbs in 7 days.). 05/26/21   Arrien, Jimmy Picket, MD  levothyroxine (SYNTHROID) 75 MCG tablet Place 1 tablet (75 mcg total) into feeding tube daily at 6 (six) AM. 04/06/21   Lajean Manes, MD  melatonin 5 MG TABS Place 1 tablet (5 mg total) into feeding tube at bedtime as needed. Patient taking differently: Place 5 mg into feeding tube at bedtime as needed (sleep). 04/14/21   Love, Ivan Anchors, PA-C  Nutritional Supplements (FEEDING SUPPLEMENT, NEPRO CARB STEADY,) LIQD Place 315 mLs into feeding tube 3 (three) times daily. Patient taking differently: Place 395 mLs into feeding tube 3 (three) times daily. 04/05/21   Lajean Manes, MD  pantoprazole (PROTONIX) 40 MG tablet Take 40 mg by mouth daily. Liquid...    [provider]  potassium chloride 20 MEQ/15ML (10%) SOLN Take by mouth. 07/07/21 07/07/22  [provider]  Probiotic Product (PROBIOTIC PO) Give 1 capsule by tube daily.    [provider]  senna-docusate (SENOKOT-S) 8.6-50 MG tablet Place 2 tablets into feeding tube at bedtime. 04/14/21   Love, Ivan Anchors, PA-C  sucralfate (CARAFATE) 1 GM/10ML suspension Place 10 mLs (1 g total) into feeding tube every 8 (eight) hours. 04/14/21   Love, Ivan Anchors, PA-C  Water For Irrigation, Sterile (FREE WATER) SOLN Place 300 mLs into feeding tube 4 (four) times daily. 04/05/21   Lajean Manes, MD    Physical Exam: Vitals:    08/14/21 1205 08/14/21 1330 08/14/21 1400 08/14/21 1500  BP: (!) 108/46 (!) 108/53 (!) 111/59 112/62  Pulse: 70 71 70 70  Resp: 19 (!) 28 (!) 24 18  Temp:      TempSrc:      SpO2: 95% 95% 96% 97%  Weight:      Height:       General: Not in acute distress HEENT:       Eyes: PERRL, EOMI, no scleral icterus.       ENT: No discharge from the ears and nose, no pharynx injection, no tonsillar enlargement.        Neck: positive JVD, no bruit, no mass felt. Heme: No neck lymph node enlargement. Cardiac: S1/S2, RRR,  No gallops or rubs. Respiratory: Fine crackles bilaterally GI: Soft, nondistended, nontender, no rebound pain, no organomegaly, BS present. GU: No hematuria Ext: has trace  leg edema bilaterally. 1+DP/PT pulse bilaterally. Musculoskeletal: No joint deformities, No joint redness or warmth, no limitation of ROM in spin. Skin:  s/p of bilateral great toenail removal with scabbed healing wounds    Neuro: Alert, oriented X3, cranial nerves II-XII grossly intact, moves all extremities. Psych: Patient is not psychotic, no suicidal or hemocidal ideation.  Labs on Admission: I have personally reviewed following labs and imaging studies  CBC: Recent Labs  Lab 08/14/21 1057  WBC 9.3  NEUTROABS 6.7  HGB 8.2*  HCT 26.5*  MCV 97.4  PLT 357*   Basic Metabolic Panel: Recent Labs  Lab 08/14/21 1057  NA 143  K 4.4  CL 114*  CO2 21*  GLUCOSE 100*  BUN 91*  CREATININE 1.66*  CALCIUM 9.5   GFR: Estimated Creatinine Clearance: 23.9 mL/min (A) (by C-G formula based on SCr of 1.66 mg/dL (H)). Liver Function Tests: Recent Labs  Lab 08/14/21 1057  AST 41  ALT 24  ALKPHOS 63  BILITOT 0.8  PROT 7.6  ALBUMIN 3.6   No results for input(s): "LIPASE", "AMYLASE" in the last 168 hours. No results for input(s): "AMMONIA" in the last 168 hours. Coagulation Profile: Recent Labs  Lab 08/14/21 1057  INR 1.3*   Cardiac Enzymes: No results for input(s): "CKTOTAL", "CKMB",  "CKMBINDEX", "TROPONINI" in the last 168 hours. BNP (last 3 results) No results for input(s): "PROBNP" in the last 8760 hours. HbA1C: No results for input(s): "HGBA1C" in the last 72 hours. CBG: No results for input(s): "GLUCAP" in the last 168 hours. Lipid Profile: No results for input(s): "CHOL", "HDL", "LDLCALC", "TRIG", "CHOLHDL", "LDLDIRECT" in the last 72 hours. Thyroid Function Tests: No results for input(s): "TSH", "T4TOTAL", "FREET4", "T3FREE", "THYROIDAB" in the last 72 hours. Anemia Panel: No results for input(s): "VITAMINB12", "FOLATE", "FERRITIN", "TIBC", "IRON", "RETICCTPCT" in the last 72 hours. Urine analysis:    Component Value Date/Time   COLORURINE YELLOW 04/01/2021 0305   APPEARANCEUR HAZY (A) 04/01/2021 0305   LABSPEC 1.008 04/01/2021 0305   PHURINE 5.0 04/01/2021 0305   GLUCOSEU NEGATIVE 04/01/2021 0305   HGBUR NEGATIVE 04/01/2021 0305   BILIRUBINUR NEGATIVE 04/01/2021 0305   KETONESUR NEGATIVE 04/01/2021 0305   PROTEINUR NEGATIVE 04/01/2021 0305   NITRITE NEGATIVE 04/01/2021 0305   LEUKOCYTESUR TRACE (A) 04/01/2021 0305   Sepsis Labs: '@LABRCNTIP'$ (procalcitonin:4,lacticidven:4) ) Recent Results (from the past 240 hour(s))  SARS Coronavirus 2 by RT PCR (hospital order, performed in [facility] hospital lab) *cepheid single result test* Anterior Nasal Swab     Status: None   Collection Time: 08/14/21  1:20 PM   Specimen: Anterior Nasal Swab  Result Value Ref Range Status   SARS Coronavirus 2 by RT PCR NEGATIVE NEGATIVE Final    Comment: (NOTE) SARS-CoV-2 target nucleic acids are NOT DETECTED.  The SARS-CoV-2 RNA is generally detectable in upper and lower respiratory specimens during the acute phase of infection. The lowest concentration of SARS-CoV-2 viral copies this assay can detect is 250 copies / mL. A negative result does not preclude SARS-CoV-2 infection and should not be used as the sole basis for treatment or other patient management decisions.   A negative result may occur with improper specimen collection / handling, submission of specimen other than nasopharyngeal swab, presence of viral mutation(s) within the areas targeted by this assay, and inadequate number of viral copies (<250 copies / mL). A negative result must be combined with clinical observations, patient history, and epidemiological information.  Fact Sheet for Patients:   https://www.patel.info/  Fact Sheet for Healthcare Providers: https://hall.com/  This test is not yet approved or  cleared by the [place] FDA and has been authorized for detection and/or diagnosis of SARS-CoV-2 by FDA under an Emergency Use Authorization (EUA).  This EUA will remain in effect (meaning this test can be used) for the duration of the COVID-19 declaration under Section 564(b)(1) of the Act, 21 U.S.C. section 360bbb-3(b)(1), unless the authorization is terminated or revoked sooner.  Performed at [facility], [address], [place], [place] [address]      Radiological Exams on Admission: DG Chest 2 View  Result Date: 08/14/2021 CLINICAL DATA:  Weakness, cough EXAM: CHEST - 2 VIEW COMPARISON:  05/24/2021 FINDINGS: Lung volumes are low. Bibasilar opacities and small pleural effusions. Interstitial prominence. Similar cardiomediastinal contours. Left chest wall pacemaker. IMPRESSION: Bibasilar atelectasis/consolidation. Small pleural effusions. Chronic interstitial changes with possible superimposed edema. Electronically Signed   By: Macy Mis M.D.   On: 08/14/2021 11:45      Assessment/Plan Principal Problem:   Acute on chronic diastolic CHF (congestive heart failure) (HCC) Active Problems:   Myocardial injury   Hypertension   Generalized weakness   Dysphagia s/p G tube    Paroxysmal atrial fibrillation (HCC)   Dyslipidemia   Hypothyroidism   Thrombocytopenia (HCC)   Stage 3a chronic kidney disease (HCC)    Protein-calorie malnutrition, moderate (HCC)   Iron deficiency anemia    Assessment and Plan: * Acute on chronic diastolic CHF (congestive heart failure) ([place]) 2D echo on 05/20/2021 showed EF of 55%.  Patient has trace leg edema, positive JVD, crackles on auscultation, elevated BNP 1034, chest x-ray showed bilateral basilar edema.  Clinically consistent with CHF exacerbation.  -Will admit to tele as inpatient -Lasix 20 mg bid by IV -Daily weights -strict I/O's -Low salt diet -Fluid restriction -Obtain REDs Vest reading    Myocardial injury Myocardial injury due to CHF exacerbation: Troponin level 155, 149, no chest pain.  -324 mg of aspirin was given -Continue home Plavix, Lipitor -As needed nitroglycerin -Trend troponin -Check A1c, FLP  Hypertension - IV hydralazine as needed -Patient is on IV Lasix  Generalized weakness Likely due to CHF exacerbation and moderate protein calorie malnutrition.  No focal neuro logic deficit. -Nutrition supplement -PT/OT -f/u UA  Dysphagia s/p G tube  - Nutrition supplement, feeding supplement, 315 cc, 3 times per day day -Consult nutrition  Paroxysmal atrial fibrillation (HCC) S/p of ablation, not on anticoagulant.  Heart rate 88 -Telemetry monitoring  Dyslipidemia - Lipitor  Hypothyroidism - Synthroid  Thrombocytopenia (HCC) This is a chronic issue.  Platelets are 127.  No bleeding tendency -Follow-up with CBC  Stage 3a chronic kidney disease ([place]) Renal function close to baseline.  Recent baseline creatinine 1.4-1.7.  His creatinine is 1.66, BUN 91. -Follow-up with BMP  Iron deficiency anemia Hemoglobin stable 8.2 (8.5 on 07/31/2021) -Follow-up with CBC  Protein-calorie malnutrition, moderate (HCC) -Nutritional supplement -Consult nutrition             DVT ppx: SQ Lovenox --> changed to SCD since patient's daughter is very concerned for possible internal bleeding  Code Status: Full code  Family  Communication:     Yes, patient's daughter   at bed side.     Disposition Plan:  Anticipate discharge back to previous environment  Consults called:  none  Admission status and Level of care: Telemetry Cardiac:   as inpt      Severity of Illness:  The appropriate patient status for  this patient is INPATIENT. Inpatient status is judged to be reasonable and necessary in order to provide the required intensity of service to ensure the patient's safety. The patient's presenting symptoms, physical exam findings, and initial radiographic and laboratory data in the context of their chronic comorbidities is felt to place them at high risk for further clinical deterioration. Furthermore, it is not anticipated that the patient will be medically stable for discharge from the hospital within 2 midnights of admission.   * I certify that at the point of admission it is my clinical judgment that the patient will require inpatient hospital care spanning beyond 2 midnights from the point of admission due to high intensity of service, high risk for further deterioration and high frequency of surveillance required.*       Date of Service 08/14/2021    Ivor Costa [facility]   If 7PM-7AM, please contact night-coverage [email or web address] 08/14/2021, 3:50 PM

## 2021-08-14 NOTE — Consult Note (Signed)
WOC Nurse Consult Note: Reason for Consult: Full thickness wounds resultant from bilateral great toe nail removal on 08/10/21 by Dr. Thomasene Lot (Podiatric Medicine). Patient was previously under the care of the outpatient wound care center and Dr. Celine Ahr for pressure injuries due to a blanket resting on his feet during a prior hospitalization. Patient with PAD. A pressure injury to the left heel has healed/is closed. Photodocumentation of wounds by Dr. Blaine Hamper at time of admission. Wound type:Pressure, surgical Pressure Injury POA: Yes Measurement:Per last note by Dr. Celine Ahr. Please see note from Encounter on 08/07/21 Wound bed:Red, moist Drainage (amount, consistency, odor) small serous to serosanguinous Periwound: intact, dry Dressing procedure/placement/frequency: Dr. Sherryle Lis has the patient's son using silver sulfadiazine (Silvadene) cream and I will continue that here. Once daily soap and water cleanse, rinse and dry will be followed by application of the antimicrobial. This will be topped with a saline moistened gauze 2x2 and a dry gauze 2x2 secured with Kerlix roll gauze and paper tape. Feet are to be placed into pressure redistribution heel boots (Prevalon) for PI prevention and protection from trauma. A prophylactic sacral foam is to be placed.  [place] nursing team will not follow, but will remain available to this patient, the nursing and medical teams.  Please re-consult if needed.  Thank you for inviting [place] to participate in this patient's Plan of Care.  Maudie Flakes, MSN, RN, CNS, [place], Serita Grammes, [employer], [employer] phone:  [phone]

## 2021-08-14 NOTE — ED Notes (Signed)
Hospitalist at bedside 

## 2021-08-14 NOTE — Assessment & Plan Note (Signed)
-   Nutrition supplement, feeding supplement, 315 cc, 3 times per day day -Consult nutrition

## 2021-08-14 NOTE — Assessment & Plan Note (Signed)
This is a chronic issue.  Platelets are 127.  No bleeding tendency -Follow-up with CBC

## 2021-08-14 NOTE — Assessment & Plan Note (Signed)
S/p of ablation, not on anticoagulant.  Heart rate 88 -Telemetry monitoring

## 2021-08-14 NOTE — Progress Notes (Signed)
When flushing pt's peg tube, I noticed the rubber/latex disc was sticking out from the patient's skin approximately 1". Flushed the peg tube with 40cc's sterile water but stopped after resistance was met. Asked ER doctor to come look at it and he determined it was coming out and replaced it with a new one. Patient responded very well to removal and insertion of tubes without any discomfort.   Flushed the new peg tube and administered pt's Nepro feeding with meds without any difficulty.

## 2021-08-14 NOTE — ED Notes (Signed)
Pt encouraged to provide urine specimen per MD order. Urinal at bedside

## 2021-08-14 NOTE — Assessment & Plan Note (Signed)
Likely due to CHF exacerbation and moderate protein calorie malnutrition.  No focal neuro logic deficit. -Nutrition supplement -PT/OT -f/u UA

## 2021-08-14 NOTE — Assessment & Plan Note (Signed)
Hemoglobin stable 8.2 (8.5 on 07/31/2021) -Follow-up with CBC

## 2021-08-14 NOTE — Assessment & Plan Note (Signed)
-   IV hydralazine as needed -Patient is on IV Lasix

## 2021-08-14 NOTE — ED Triage Notes (Signed)
Arrived by EMS from home. Bedbound. Family called for weakness X4 days. HX blood transfusions. C/o sore feet and recently had toenail removed. EMS vitals 130/60 blood pressure, HR 72, pacemaker, 95% RA, cbg 115.

## 2021-08-14 NOTE — Assessment & Plan Note (Signed)
2D echo on 05/20/2021 showed EF of 55%.  Patient has trace leg edema, positive JVD, crackles on auscultation, elevated BNP 1034, chest x-ray showed bilateral basilar edema.  Clinically consistent with CHF exacerbation.  -Will admit to tele as inpatient -Lasix 20 mg bid by IV -Daily weights -strict I/O's -Low salt diet -Fluid restriction -Obtain REDs Vest reading

## 2021-08-14 NOTE — ED Provider Notes (Signed)
[facility] Provider Note    Event Date/Time   First MD Initiated Contact with Patient 08/14/21 1055     (approximate)   History   Chief Complaint Weakness   HPI  Gregory [name] is a 88 y.o. male with past medical history of hypertension, hyperlipidemia, atrial fibrillation, CHF, CKD, and chronic dysphagia status post PEG tube who presents to the ED complaining of generalized weakness.  30 of history is obtained from patient's son and daughter at bedside, who states that over the past 4 to 5 days he has seemed increasingly weak beyond his baseline.  Patient is bedbound at baseline and family has not noticed any focal neurologic deficits.  He does complain of "feeling bad" with generalized malaise, but denies any chest pain, shortness of breath, fevers, nausea, vomiting, diarrhea, or dysuria.  He does report a recent nonproductive cough.  He recently had toenails removed from his 2 great toes.      Physical Exam   Triage Vital Signs: ED Triage Vitals  Enc Vitals Group     BP 08/14/21 1051 125/63     Pulse Rate 08/14/21 1051 88     Resp 08/14/21 1051 (!) 26     Temp 08/14/21 1054 97.9 F (36.6 C)     Temp Source 08/14/21 1054 Oral     SpO2 08/14/21 1051 95 %     Weight 08/14/21 1052 150 lb (68 kg)     Height 08/14/21 1052 '5\' 10"'$  (1.778 m)     Head Circumference --      Peak Flow --      Pain Score 08/14/21 1052 10     Pain Loc --      Pain Edu? --      Excl. in [place]? --     Most recent vital signs: Vitals:   08/14/21 1054 08/14/21 1205  BP:  (!) 108/46  Pulse:  70  Resp:  19  Temp: 97.9 F (36.6 C)   SpO2:  95%    Constitutional: Alert and oriented.  Pale appearing. Eyes: Conjunctivae are normal. Head: Atraumatic. Nose: No congestion/rhinnorhea. Mouth/Throat: Mucous membranes are moist.  Cardiovascular: Normal rate, regular rhythm. Grossly normal heart sounds.  2+ radial pulses bilaterally. Respiratory: Normal respiratory effort.   No retractions. Lungs CTAB. Gastrointestinal: Soft and nontender. No distention.  PEG tube site clean, dry, and intact. Musculoskeletal: No lower extremity tenderness nor edema.  Portions of bilateral great toes healing status post nail removal, no associated erythema, warmth, or drainage. Neurologic:  Normal speech and language. No gross focal neurologic deficits are appreciated.    ED Results / Procedures / Treatments   Labs (all labs ordered are listed, but only abnormal results are displayed) Labs Reviewed  CBC WITH DIFFERENTIAL/PLATELET - Abnormal; Notable for the following components:      Result Value   RBC 2.72 (*)    Hemoglobin 8.2 (*)    HCT 26.5 (*)    RDW 20.5 (*)    Platelets 127 (*)    Monocytes Absolute 1.1 (*)    All other components within normal limits  COMPREHENSIVE METABOLIC PANEL - Abnormal; Notable for the following components:   Chloride 114 (*)    CO2 21 (*)    Glucose, Bld 100 (*)    BUN 91 (*)    Creatinine, Ser 1.66 (*)    GFR, Estimated 37 (*)    All other components within normal limits  TROPONIN I (HIGH SENSITIVITY) - Abnormal; Notable  for the following components:   Troponin I (High Sensitivity) 155 (*)    All other components within normal limits  SARS CORONAVIRUS 2 BY RT PCR  URINALYSIS, ROUTINE W REFLEX MICROSCOPIC  BRAIN NATRIURETIC PEPTIDE  TROPONIN I (HIGH SENSITIVITY)     EKG  ED ECG REPORT I, Blake Divine, the attending physician, personally viewed and interpreted this ECG.   Date: 08/14/2021  EKG Time: 10:53  Rate: 71  Rhythm: normal sinus rhythm  Axis: LAD  Intervals:nonspecific intraventricular conduction delay  ST&T Change: None, similar to previous  RADIOLOGY Chest x-ray reviewed and interpreted by me with no focal infiltrate but changes concerning for pulmonary edema.  PROCEDURES:  Critical Care performed: No  Procedures   MEDICATIONS ORDERED IN ED: Medications  furosemide (LASIX) injection 40 mg (has no  administration in time range)  aspirin chewable tablet 324 mg (has no administration in time range)     IMPRESSION / MDM / ASSESSMENT AND PLAN / ED COURSE  I reviewed the triage vital signs and the nursing notes.                              88 y.o. male with past medical history of hypertension, hyperlipidemia, atrial fibrillation, CHF, CKD, and chronic dysphagia status post PEG tube who presents to the ED complaining of increasing generalized weakness over the past 4 to 5 days with a nonproductive cough.  Patient's presentation is most consistent with acute presentation with potential threat to life or bodily function.  Differential diagnosis includes, but is not limited to, anemia, AKI, electrolyte abnormality, ACS, UTI, pneumonia, sepsis.  Patient chronically ill-appearing but in no acute distress, vital signs are unremarkable and he has a benign abdominal exam.  EKG without evidence of arrhythmia or ischemia and I have low suspicion for cardiac etiology for his symptoms.  We will screen troponin, also check for AKI, electrolyte abnormality, or anemia.  Plan to check chest x-ray and urinalysis for evidence of infectious process contributing to his symptoms.  Sites of nail removal to both great toes appear to be well-healing with no signs of infection at this time.  Chest x-ray concerning for mild pulmonary edema and troponin slightly elevated at 155.  Patient denies any chest pain or shortness of breath, but is slightly tachypneic on reassessment and may benefit from IV Lasix.  Low suspicion for ACS at this time and we will trend troponin but hold off on heparin, troponin elevation may be due to CHF exacerbation.  Renal function is comparable to previous with no significant electrolyte abnormality, hemoglobin also comparable to previous with no acute anemia.  Case discussed with hospitalist for admission.      FINAL CLINICAL IMPRESSION(S) / ED DIAGNOSES   Final diagnoses:  Generalized  weakness  Elevated troponin  Acute on chronic congestive heart failure, unspecified heart failure type ([place])     Rx / DC Orders   ED Discharge Orders     None        Note:  This document was prepared using Dragon voice recognition software and may include unintentional dictation errors.   Blake Divine, MD 08/14/21 1300

## 2021-08-15 DIAGNOSIS — I5033 Acute on chronic diastolic (congestive) heart failure: Secondary | ICD-10-CM

## 2021-08-15 LAB — LIPID PANEL
Cholesterol: 99 mg/dL (ref 0–200)
HDL: 47 mg/dL (ref >40)
LDL Cholesterol: 47 mg/dL (ref 0–99)
Total CHOL/HDL Ratio: 2.1 RATIO
Triglycerides: 23 mg/dL (ref <150)
VLDL: 5 mg/dL (ref 0–40)

## 2021-08-15 LAB — CBC
HCT: 26.1 % — ABNORMAL LOW (ref 39.0–52.0)
Hemoglobin: 8.1 g/dL — ABNORMAL LOW (ref 13.0–17.0)
MCH: 30.1 pg (ref 26.0–34.0)
MCHC: 31 g/dL (ref 30.0–36.0)
MCV: 97 fL (ref 80.0–100.0)
Platelets: 124 10*3/uL — ABNORMAL LOW (ref 150–400)
RBC: 2.69 MIL/uL — ABNORMAL LOW (ref 4.22–5.81)
RDW: 20.3 % — ABNORMAL HIGH (ref 11.5–15.5)
WBC: 9.3 10*3/uL (ref 4.0–10.5)
nRBC: 0 % (ref 0.0–0.2)

## 2021-08-15 LAB — BASIC METABOLIC PANEL
Anion gap: 8 (ref 5–15)
BUN: 97 mg/dL — ABNORMAL HIGH (ref 8–23)
CO2: 25 mmol/L (ref 22–32)
Calcium: 9.5 mg/dL (ref 8.9–10.3)
Chloride: 110 mmol/L (ref 98–111)
Creatinine, Ser: 1.68 mg/dL — ABNORMAL HIGH (ref 0.61–1.24)
GFR, Estimated: 37 mL/min — ABNORMAL LOW (ref >60)
Glucose, Bld: 97 mg/dL (ref 70–99)
Potassium: 3.8 mmol/L (ref 3.5–5.1)
Sodium: 143 mmol/L (ref 135–145)

## 2021-08-15 LAB — CBG MONITORING, ED
Glucose-Capillary: 99 mg/dL (ref 70–99)
Glucose-Capillary: 140 mg/dL — ABNORMAL HIGH (ref 70–99)

## 2021-08-15 LAB — GLUCOSE, CAPILLARY
Glucose-Capillary: 107 mg/dL — ABNORMAL HIGH (ref 70–99)
Glucose-Capillary: 126 mg/dL — ABNORMAL HIGH (ref 70–99)

## 2021-08-15 LAB — HEMOGLOBIN A1C
Hgb A1c MFr Bld: 5.8 % — ABNORMAL HIGH (ref 4.8–5.6)
Mean Plasma Glucose: 119.76 mg/dL

## 2021-08-15 MED ORDER — FREE WATER
100.0000 mL | Status: DC
  Administered 2021-08-15 – 2021-08-16 (×7): 100 mL
  Filled 2021-08-15 (×2): qty 100

## 2021-08-15 MED ORDER — NEPRO/CARBSTEADY PO LIQD
1000.0000 mL | ORAL | Status: DC
  Administered 2021-08-15: 1000 mL

## 2021-08-15 NOTE — Progress Notes (Signed)
OT Cancellation Note  Patient Details Name: Hudsen Fei MRN: [id number] DOB: 1920/10/30   Cancelled Treatment:    Reason Eval/Treat Not Completed: Fatigue/lethargy limiting ability to participate;Other (comment). Consult received, chart reviewed. Upon attempt, pt lethargic, unable to maintain alertness to respond to questions. Son reports pt essentially bed bound past 2-3 weeks and needing total assist from family/caregivers for bed mobility, transfers to w/c and all ADL. Pt unable to self propel. Son reports the pt was just too weak to participate in therapy at home. When asked whether the son felt comfortable with an OT evaluation today, he stated "he's too weak, what's the point?" Son agreeable to hold therapy today and for PT/OT to check back in tomorrow (if pt is still here) to see where things stand and if they are agreeable at that time to therapy assessing him. Will re-attempt next date as appropriate.   Ardeth Perfect., MPH, MS, OTR/L ascom [phone] 08/15/21, 1:49 PM

## 2021-08-15 NOTE — Progress Notes (Signed)
PT Cancellation Note  Patient Details Name: Gregory [name] MRN: [id number] DOB: [birth year]   Cancelled Treatment:    Reason Eval/Treat Not Completed: Fatigue/lethargy limiting ability to participate (Per OT, patient lethargic and unable to tolerate therapy evaluations this date.  Additionally, family unsure of wishes for therapy involvement at this time. Will re-attempt next date if patient remains hospitalized, pending clarification of goals of care)   Creg Gilmer H. Owens Shark, PT, DPT, NCS 08/15/21, 1:54 PM [phone]

## 2021-08-15 NOTE — Progress Notes (Signed)
Initial Nutrition Assessment  DOCUMENTATION CODES:   Not applicable  INTERVENTION:   Initiate Nepro '@45ml'$ /hr continuous   Free water flushes 115m q4 hours to maintain tube patency   Regimen provides 1944kcal/day, 88g/day protein and 13854mday of free water   NUTRITION DIAGNOSIS:   Inadequate oral intake related to dysphagia as evidenced by NPO status (pt with chronic G-tube).  GOAL:   Patient will meet greater than or equal to 90% of their needs  MONITOR:   Labs, Weight trends, TF tolerance, Skin, I & O's  REASON FOR ASSESSMENT:   Consult Enteral/tube feeding initiation and management  ASSESSMENT:   [age].o. male with medical history significant of hypertension, hyperlipidemia, GERD, hypothyroidism, pacemaker placement, atrial fibrillation s/p of ablation, not on anticoagulants, dCHF, CKD-3A, anemia, dysphagia (s/p IR G-tube placement 02/21/21), PVD, s/p of TAVR, GI bleeding and bedbound who presents with generalized weakness and SOB.  Unable to see pt as pt remains in the ED. Spoke with pt's daughter via phone. Daughter is unsure of pt's home tube feed regimen but believes that it has not changed since his last admission. Per last RD note from April, pt receives [employer] Renal- 5 cartons per day at home (given as 39525mID via tube); this provides 2375kcal/day, 110g/day protein and 840m20my of free water. Daughter is unsure of the free water flushes. Pt does not eat anything by mouth. G-tube replaced in the ED r/t malpositioning. Pt currently receiving Nepro oral supplements via his tube while in the ED. RD will change pt over to Nepro continuous while pt is hospitalized. Per chart, pt's weights range from 59.9-104kg over the past 6 months so it is difficult to determine if patient has had any significant weight changes. RD will follow-up to obtain nutrition related exam and history once pt is admitted into the hospital.   Medications reviewed and include: plavix, pepcid,  ferrous sulfate, lactobacillus, synthroid, senokot  Labs reviewed: K 3.8 wnl, BUN 97(H), creat 1.68(H) Hgb 8.1(L), Hct 26.1(L) Cbgs- 140, 99 x 24 hrs   NUTRITION - FOCUSED PHYSICAL EXAM: Unable to perform at this time   Diet Order:   Diet Order             Diet NPO time specified  Diet effective now                  EDUCATION NEEDS:   No education needs have been identified at this time  Skin:   not assessed   Last BM:  pta  Height:   Ht Readings from Last 1 Encounters:  08/14/21 '5\' 10"'$  (1.778 m)    Weight:   Wt Readings from Last 1 Encounters:  08/14/21 68 kg    Ideal Body Weight:  75.45 kg  BMI:  Body mass index is 21.52 kg/m.  Estimated Nutritional Needs:   Kcal:  1700-2000kcal/day  Protein:  85-100g/day  Fluid:  1.7-2.0L/day  CaseKoleen Distance RD, LDN Please [facility] and/or RD on-call/weekend/after hours pager

## 2021-08-15 NOTE — ED Notes (Signed)
Pt pushed his blankets down to waist and then asked to have them removed from person. Blankets removed and put in chair by bed. Pt was coved with sheet. Pt appears to be resting comfortably

## 2021-08-15 NOTE — Progress Notes (Signed)
PROGRESS NOTE  Gregory [name]  DOB: 1920-10-02  PCP: Orpah Greek, [place]  DOA: 08/14/2021  LOS: 1 day  Hospital Day: 2  Brief narrative: [name] is a 88 y.o. male with PMH significant for HTN, HLD, PVD, diastolic CHF, A-fib status post ablation, status post pacemaker not on anticoagulation, AS status post TAVR, history of GI bleeding, CKD 3, chronic anemia, hypothyroidism, dysphagia status post G-tube placement who is bedbound and is taken care at home. Patient was brought to the ED by EMS from home with complaint of weakness progressive for last 4 days associated with productive cough, no chest pain or fever. Patient had toenails removed in both great toes recently by podiatrist.  Patient has been following up with wound care.  He completed a course of Levaquin 1 day prior to presentation.  In the ED, patient was afebrile, heart rate in 70s and 80s, respiration 20s, blood pressure in low normal range, breathing on room air Labs with BNP elevated to over 1000, troponin elevated to 155, hemoglobin low at 8.2, WBC count 9.3. Chest x-ray showed bibasilar atelectasis/consolidation, small pleural effusions as well as chronic interstitial changes with possible superimposed edema. EKG with paced rhythm and QTc 506 ms Patient was admitted to hospitalist service  Subjective: Patient was seen and examined this morning.  Pleasant elderly Caucasian male.  Propped up in bed.  Not in distress.  Looks weak.  Hard of hearing.  Able to shake head to answer questions.  Most of the conversation happened with his son and daughter at bedside. Chart reviewed Blood pressure low normal range Labs this morning with creatinine elevated and stable at 1.68, hemoglobin remains low at 8.1  Assessment and plan: Acute on chronic diastolic CHF Hypertension -Blood from home with progressive worsening weakness and productive cough for last 4 days.   -On exam patient was noted to have trace  leg edema, positive JVD, crackles on auscultation, elevated BNP and chest x-ray suggestive of bilateral basilar edema.   -He was started on Lasix 20 mg IV twice daily.  With 2 doses of Lasix, he made more than 1.7 L of urine.  Looks euvolemic to me.  I would hold further dose of Lasix for today. -PTA on Lasix 20 mg daily as needed. -Last 2D echo on 05/20/2021 showed EF of 55%.   -Net IO Since Admission: -1,760 mL [08/15/21 1641] -Continue to monitor for daily intake output, weight, blood pressure, BNP, renal function and electrolytes. Recent Labs  Lab 08/14/21 1057 08/15/21 0555  BNP 1,034.7*  --   BUN 91* 97*  CREATININE 1.66* 1.68*  K 4.4 3.8    Elevated troponin Dyslipidemia -Likely demand ischemia due to LV strain  -Continue Plavix and Lipitor from home Recent Labs    08/14/21 1057 08/14/21 1320 08/14/21 1504  TROPONINIHS 155* 149* 159*    Paroxysmal atrial fibrillation (HCC) -s/p of ablation, not on anticoagulant.  Heart rate 88 -Continue telemetry monitoring  CKD 3a -Creatinine remains at baseline.  Continue to monitor. Recent Labs    05/20/21 0531 05/21/21 0058 05/22/21 0112 05/23/21 0042 05/24/21 0100 05/25/21 0028 05/26/21 0105 07/31/21 2010 08/14/21 1057 08/15/21 0555  BUN 46* 44* 47* 59* 64* 62* 70* 97* 91* 97*  CREATININE 1.39* 1.37* 1.42* 1.62* 1.51* 1.42* 1.52* 1.74* 1.66* 1.68*   Generalized weakness -Likely due to CHF exacerbation and moderate protein calorie malnutrition.   -No focal neuro logic deficit. -Nutrition supplement -PT/OT consulted   Dysphagia s/p G tube  Moderate  protein calorie malnutrition -Nutrition supplement, feeding supplement, 315 cc, 3 times per day day -Consult nutrition    Hypothyroidism - Synthroid   Chronic thrombocytopenia -Low blood stable platelets platelet count without evidence of bleeding. Recent Labs  Lab 08/14/21 1057 08/15/21 0555  PLT 127* 124*   Chronic iron deficiency anemia -Hemoglobin baseline  between 8 and 9.  Remains stable at this time.   Recent Labs    03/02/21 1516 03/02/21 1550 03/02/21 1600 03/02/21 2220 03/31/21 1847 03/31/21 2015 04/01/21 0249 04/02/21 0141 04/03/21 0114 04/04/21 0107 05/22/21 0112 05/23/21 0042 06/30/21 0808 06/30/21 1426 07/31/21 2010 08/14/21 1057 08/15/21 0555  HGB  --   --   --    < > 7.4*   < > 7.7* 8.8*  --    < > 8.3*   < > 7.1* 8.0* 8.5* 8.2* 8.1*  MCV  --   --   --    < > 96.6   < > 96.3 95.7  --    < > 97.4   < >  --   --  97.8 97.4 97.0  VITAMINB12 1,247*  --   --   --   --   --   --   --   --   --   --   --   --   --   --   --   --   FOLATE  --  22.2  --   --   --   --   --   --   --   --   --   --   --   --   --   --   --   FERRITIN 584*  --   --   --  802*  --  918* 1,363* 1,190*  --  506*  --   --   --   --   --   --   TIBC 221*  --   --   --   --   --   --   --   --   --  283  --   --   --   --   --   --   IRON 87  --   --   --   --   --   --   --   --   --  45  --   --   --   --   --   --   RETICCTPCT  --   --  2.4  --   --   --   --   --   --   --   --   --   --   --   --   --   --    < > = values in this interval not displayed.    Goals of care   Code Status: Full Code.  Family understands his debility.  They would like to revisit the CODE STATUS if his condition worsens.   Mobility: Bedbound for last few months.  Skin assessment:     Nutritional status:  Body mass index is 21.52 kg/m.  Nutrition Problem: Inadequate oral intake Etiology: dysphagia Signs/Symptoms: NPO status (pt with chronic G-tube)     Diet:  Diet Order             Diet NPO time specified  Diet effective now  DVT prophylaxis:  Place and maintain sequential compression device Start: 08/14/21 1857   Antimicrobials: None Fluid: None Consultants: None Family Communication: Family at bedside  Status is: Inpatient  Continue in-hospital care because: Repeat hemoglobin, renal function tomorrow Level of care:  Telemetry Cardiac   Dispo: The patient is from: Home              Anticipated d/c is to: Hopefully home with home with services tomorrow              Patient currently is not medically stable to d/c.   Difficult to place patient No     Infusions:   feeding supplement (NEPRO CARB STEADY) 1,000 mL (08/15/21 1411)    Scheduled Meds:  atorvastatin  40 mg Per Tube Daily   chlorhexidine  15 mL Mouth Rinse BID   clopidogrel  75 mg Per Tube Daily   famotidine  20 mg Per Tube QHS   ferrous sulfate  300 mg Per Tube Daily   free water  100 mL Per Tube Q4H   lactobacillus  1 g Per Tube TID   levothyroxine  75 mcg Per Tube Q0600   senna-docusate  2 tablet Per Tube QHS   silver sulfADIAZINE   Topical Daily    PRN meds: acetaminophen, albuterol, dextromethorphan-guaiFENesin, hydrALAZINE, melatonin, nitroGLYCERIN, ondansetron (ZOFRAN) IV   Antimicrobials: Anti-infectives (From admission, onward)    None       Objective: Vitals:   08/15/21 1300 08/15/21 1526  BP: (!) 122/58 (!) 107/56  Pulse: 70 71  Resp: 20 20  Temp: 98.7 F (37.1 C) 98.4 F (36.9 C)  SpO2: 96% 98%    Intake/Output Summary (Last 24 hours) at 08/15/2021 1641 Last data filed at 08/15/2021 0534 Gross per 24 hour  Intake --  Output 1500 ml  Net -1500 ml   Filed Weights   08/14/21 1052  Weight: 68 kg   Weight change:  Body mass index is 21.52 kg/m.   Physical Exam: General exam: Pleasant, elderly Caucasian male.  Not in distress Skin: No rashes, lesions or ulcers. HEENT: Atraumatic, normocephalic, no obvious bleeding Lungs: Clear to auscultation bilaterally.  Shallow respiratory effort. CVS: Regular rate and rhythm, no murmur GI/Abd soft, nontender, nondistended, bowel sound present.  PEG tube site intact CNS: Alert, awake, slow to respond, hard of hearing Psychiatry: Sad affect Extremities: No pedal edema, no calf tenderness  Data Review: I have personally reviewed the laboratory data and studies  available.  F/u labs ordered Unresulted Labs (From admission, onward)     Start     Ordered   08/16/21 0500  CBC with Differential/Platelet  Tomorrow morning,   R        08/15/21 1641   08/16/21 9480  Basic metabolic panel  Tomorrow morning,   R        08/15/21 1641            Signed, Terrilee Croak, MD [facility] 08/15/2021

## 2021-08-16 ENCOUNTER — Ambulatory Visit (HOSPITAL_BASED_OUTPATIENT_CLINIC_OR_DEPARTMENT_OTHER): Payer: Medicare Other | Admitting: General Surgery

## 2021-08-16 DIAGNOSIS — I5033 Acute on chronic diastolic (congestive) heart failure: Secondary | ICD-10-CM | POA: Diagnosis not present

## 2021-08-16 LAB — CBC WITH DIFFERENTIAL/PLATELET
Abs Immature Granulocytes: 0.04 10*3/uL (ref 0.00–0.07)
Basophils Absolute: 0.1 10*3/uL (ref 0.0–0.1)
Basophils Relative: 1 %
Eosinophils Absolute: 0.2 10*3/uL (ref 0.0–0.5)
Eosinophils Relative: 3 %
HCT: 26.6 % — ABNORMAL LOW (ref 39.0–52.0)
Hemoglobin: 8.4 g/dL — ABNORMAL LOW (ref 13.0–17.0)
Immature Granulocytes: 0 %
Lymphocytes Relative: 11 %
Lymphs Abs: 1 10*3/uL (ref 0.7–4.0)
MCH: 30.4 pg (ref 26.0–34.0)
MCHC: 31.6 g/dL (ref 30.0–36.0)
MCV: 96.4 fL (ref 80.0–100.0)
Monocytes Absolute: 1.1 10*3/uL — ABNORMAL HIGH (ref 0.1–1.0)
Monocytes Relative: 12 %
Neutro Abs: 6.7 10*3/uL (ref 1.7–7.7)
Neutrophils Relative %: 73 %
Platelets: 126 10*3/uL — ABNORMAL LOW (ref 150–400)
RBC: 2.76 MIL/uL — ABNORMAL LOW (ref 4.22–5.81)
RDW: 20.5 % — ABNORMAL HIGH (ref 11.5–15.5)
WBC: 9.2 10*3/uL (ref 4.0–10.5)
nRBC: 0 % (ref 0.0–0.2)

## 2021-08-16 LAB — BASIC METABOLIC PANEL
Anion gap: 8 (ref 5–15)
BUN: 90 mg/dL — ABNORMAL HIGH (ref 8–23)
CO2: 25 mmol/L (ref 22–32)
Calcium: 10.1 mg/dL (ref 8.9–10.3)
Chloride: 114 mmol/L — ABNORMAL HIGH (ref 98–111)
Creatinine, Ser: 1.49 mg/dL — ABNORMAL HIGH (ref 0.61–1.24)
GFR, Estimated: 42 mL/min — ABNORMAL LOW (ref >60)
Glucose, Bld: 125 mg/dL — ABNORMAL HIGH (ref 70–99)
Potassium: 3.8 mmol/L (ref 3.5–5.1)
Sodium: 147 mmol/L — ABNORMAL HIGH (ref 135–145)

## 2021-08-16 NOTE — TOC Initial Note (Addendum)
Transition of Care [facility]) - Initial/Assessment Note    Patient Details  Name: Gregory [name] MRN: [id number] Date of Birth: 1920-07-02  Transition of Care [facility]) CM/SW Contact:    Beverly Sessions, RN Phone Number: 08/16/2021, 11:53 AM  Clinical Narrative:                  Admitted for: CHF Admitted from: home.  Lives with son and has 24/7 caregivers through home instead and [place] PCP: Onnie Graham, family transports [facility] Current home health/prior home health/DME: RW, cane, BSC.  Open with [facility] home health.   Daughter at bedside and will transport at discharge - requested MD to enter resumption of home health orders - cheryl with [facility] notified of discharge  Family declined PT and OT eval while in hospital     Update family request EMS transport. EMS packet on chart and EMS called.    Expected Discharge Plan: [place] Barriers to Discharge: Barriers Resolved   Patient Goals and CMS Choice        Expected Discharge Plan and Services Expected Discharge Plan: [place]       Living arrangements for the past 2 months: Single Family Home                           HH Arranged: PT, OT, RN [facility] Agency: [place] Date [facility] Agency Contacted: 08/16/21   Representative spoke with at [place]: Malachy Mood  Prior Living Arrangements/Services Living arrangements for the past 2 months: [place] Lives with:: Adult Children, Other (Comment)   Do you feel safe going back to the place where you live?: Yes          Current home services: DME    Activities of Daily Living Home Assistive Devices/Equipment: None ADL Screening (condition at time of admission) Patient's cognitive ability adequate to safely complete daily activities?: No Is the patient deaf or have difficulty hearing?: Yes Does the patient have difficulty seeing, even when wearing glasses/contacts?: Yes Does the patient have difficulty  concentrating, remembering, or making decisions?: Yes Patient able to express need for assistance with ADLs?: Yes Does the patient have difficulty dressing or bathing?: Yes Independently performs ADLs?: No Does the patient have difficulty walking or climbing stairs?: Yes Weakness of Legs: Both Weakness of Arms/Hands: Both  Permission Sought/Granted                  Emotional Assessment              Admission diagnosis:  Elevated troponin [R77.8] NSTEMI (non-ST elevated myocardial infarction) ([place]) [I21.4] Generalized weakness [R53.1] Acute on chronic congestive heart failure, unspecified heart failure type ([place]) [I50.9] Patient Active Problem List   Diagnosis Date Noted   Myocardial injury 08/14/2021   Protein-calorie malnutrition, moderate ([place]) 08/14/2021   Generalized weakness 08/14/2021   Thrombocytopenia ([place]) 08/14/2021   Acute on chronic diastolic CHF (congestive heart failure) ([place]) 08/14/2021   NSTEMI (non-ST elevated myocardial infarction) ([place]) 08/14/2021   Iron deficiency anemia 08/14/2021   Acute kidney injury superimposed on chronic kidney disease ([place]) 05/22/2021   Unspecified atrial fibrillation ([place]) 05/20/2021   Hypertension    Paroxysmal atrial fibrillation (H[facility])    Protein calorie malnutrition ([place])    Deafness    History of COVID-19    Anemia    Dyslipidemia    Thyroid disease    PVD (peripheral vascular disease) ([place])  CHF (congestive heart failure), NYHA class IV ([place]) 05/19/2021   Hypophosphatemia 04/17/2021   Stage 3a chronic kidney disease ([place])    Acute delirium    COVID-19 04/01/2021   GIB (gastrointestinal bleeding) 03/31/2021   Malaise and fatigue 03/31/2021   Cough, unspecified 03/31/2021   UTI (urinary tract infection) 03/31/2021   COVID-19 virus infection 03/31/2021   Acute on chronic anemia with recent GI bleed 03/31/2021   Complete heart block s/p PPM  03/31/2021   Fever 03/31/2021   Hypothyroidism 03/31/2021    Debility 03/10/2021   Protein-calorie malnutrition, severe 03/07/2021   Symptomatic anemia 03/02/2021   Dysphagia s/p G tube  03/02/2021   Protein malnutrition ([place]) 03/02/2021   Unspecified severe protein-calorie malnutrition ([place]) 07/28/2020   CRI (chronic renal insufficiency), stage 3 (moderate) ([place]) 02/01/2020   Dizziness 02/01/2020   Unspecified hearing loss, bilateral 06/18/2018   S/P TAVR (transcatheter aortic valve replacement) 10/15/2014   Bradycardia 09/08/2013   Cardiac pacemaker in situ 09/08/2013   Inguinal hernia without mention of obstruction or gangrene, unilateral or unspecified, (not specified as recurrent)-left 08/11/2013   Chronic atrial fibrillation s/p ablation  08/11/2013   GERD (gastroesophageal reflux disease) 04/23/2013   PCP:  Orpah Greek, DO [facility]   [id number] [name]Phone: [phone]: [phone] [id number] [address] [place]one: [phone]: [id number] [address] [place] [id number]: [phone]: [phone] [address] [place]: [phone]: [phone]  Social Determinants of Health (SDOH) Interventions    Readmission Risk Interventions    08/16/2021   11:52 AM 03/10/2021    3:21 PM  Readmission Risk Prevention Plan  Transportation Screening Complete Complete  PCP or Specialist Appt within 5-7 Days  Complete  Home Care Screening  Complete  Medication Review (RN CM)  Complete  Medication Review (RN Care Manager) Complete   HRI or HomSteptoemplete   SW Recovery Care/Counseling Consult Complete   Palliative  Care Screening Complete   [place] Applicable

## 2021-08-16 NOTE — Discharge Summary (Signed)
Physician Discharge Summary  Gregory [name] EEF:[id number] DOB: 11-Jan-1921 DOA: 08/14/2021  PCP: Orpah Greek, DO  Admit date: 08/14/2021 Discharge date: 08/16/2021  Admitted From: home Discharge disposition: Home with home health PT, OT, RN  Brief narrative: Gregory [name] is a 88 y.o. male with PMH significant for HTN, HLD, PVD, diastolic CHF, A-fib status post ablation, status post pacemaker not on anticoagulation, AS status post TAVR, history of GI bleeding, CKD 3, chronic anemia, hypothyroidism, dysphagia status post G-tube placement who is bedbound and is taken care at home. Patient was brought to the ED by EMS from home with complaint of weakness progressive for last 4 days associated with productive cough, no chest pain or fever. Patient had toenails removed in both great toes recently by podiatrist.  Patient has been following up with wound care.  He completed a course of Levaquin 1 day prior to presentation.  In the ED, patient was afebrile, heart rate in 70s and 80s, respiration 20s, blood pressure in low normal range, breathing on room air Labs with BNP elevated to over 1000, troponin elevated to 155, hemoglobin low at 8.2, WBC count 9.3. Chest x-ray showed bibasilar atelectasis/consolidation, small pleural effusions as well as chronic interstitial changes with possible superimposed edema. EKG with paced rhythm and QTc 506 ms Patient was admitted to hospitalist service  Subjective: Patient was seen and examined this morning.   Lying down in bed.  More alert than yesterday.  Not in distress.  Son and daughter at bedside.   Patient's son was concerned about changing PEG tube yesterday.  I answered his questions to the best I could.  Patient will continue to use the PEG tube that he has currently.  Hospital course: Acute on chronic diastolic CHF Hypertension -Brought from home with progressive worsening weakness and productive cough for last 4 days.   -On exam patient  was noted to have trace leg edema, positive JVD, crackles on auscultation, elevated BNP and chest x-ray suggestive of bilateral basilar edema.   -He was given Lasix 20 mg IV twice daily with more than 3 L of diuresis. -PTA on Lasix 20 mg daily as needed.  Continue the same plan at home. -Last 2D echo on 05/20/2021 showed EF of 55%.   -Net IO Since Admission: -3,391 mL [08/16/21 1545] Recent Labs  Lab 08/14/21 1057 08/15/21 0555 08/16/21 0600  BNP 1,034.7*  --   --   BUN 91* 97* 90*  CREATININE 1.66* 1.68* 1.49*  K 4.4 3.8 3.8    Elevated troponin Dyslipidemia -Likely demand ischemia due to LV strain  -Continue Plavix and Lipitor from home Recent Labs    08/14/21 1057 08/14/21 1320 08/14/21 1504  TROPONINIHS 155* 149* 159*    Paroxysmal atrial fibrillation (HCC) -s/p of ablation, not on anticoagulant.  Heart rate 88  CKD 3a -Creatinine remains at baseline.  Continue to monitor. Recent Labs    05/21/21 0058 05/22/21 0112 05/23/21 0042 05/24/21 0100 05/25/21 0028 05/26/21 0105 07/31/21 2010 08/14/21 1057 08/15/21 0555 08/16/21 0600  BUN 44* 47* 59* 64* 62* 70* 97* 91* 97* 90*  CREATININE 1.37* 1.42* 1.62* 1.51* 1.42* 1.52* 1.74* 1.66* 1.68* 1.49*   Generalized weakness -Likely due to CHF exacerbation and moderate protein calorie malnutrition.   -No focal neuro logic deficit. -Nutrition supplement -PT/OT consulted   Dysphagia s/p G tube  Moderate protein calorie malnutrition -Nutrition supplement, feeding supplement, 315 cc, 3 times per day day -Consult nutrition    Hypothyroidism -Continue Synthroid  Chronic thrombocytopenia -Low blood stable platelets platelet count without evidence of bleeding. Recent Labs  Lab 08/14/21 1057 08/15/21 0555 08/16/21 0600  PLT 127* 124* 126*   Chronic iron deficiency anemia -Hemoglobin baseline between 8 and 9.  Remains stable at this time.   Recent Labs    03/02/21 1516 03/02/21 1550 03/02/21 1600 03/02/21 2220  03/31/21 1847 03/31/21 2015 04/01/21 0249 04/02/21 0141 04/03/21 0114 04/04/21 0107 05/22/21 0112 05/23/21 0042 06/30/21 1426 07/31/21 2010 08/14/21 1057 08/15/21 0555 08/16/21 0600  HGB  --   --   --    < > 7.4*   < > 7.7* 8.8*  --    < > 8.3*   < > 8.0* 8.5* 8.2* 8.1* 8.4*  MCV  --   --   --    < > 96.6   < > 96.3 95.7  --    < > 97.4   < >  --  97.8 97.4 97.0 96.4  VITAMINB12 1,247*  --   --   --   --   --   --   --   --   --   --   --   --   --   --   --   --   FOLATE  --  22.2  --   --   --   --   --   --   --   --   --   --   --   --   --   --   --   FERRITIN 584*  --   --   --  802*  --  918* 1,363* 1,190*  --  506*  --   --   --   --   --   --   TIBC 221*  --   --   --   --   --   --   --   --   --  283  --   --   --   --   --   --   IRON 87  --   --   --   --   --   --   --   --   --  45  --   --   --   --   --   --   RETICCTPCT  --   --  2.4  --   --   --   --   --   --   --   --   --   --   --   --   --   --    < > = values in this interval not displayed.   Goals of care   Code Status: Full Code.  Family understands his debility.  They would like to revisit the CODE STATUS if his condition worsens.   Mobility: Bedbound for last few months.  Skin assessment:     Nutritional status:  Body mass index is 21.7 kg/m.  Nutrition Problem: Inadequate oral intake Etiology: dysphagia Signs/Symptoms: NPO status (pt with chronic G-tube)     Wounds:  - Pressure Injury 03/03/21 Heel Left Deep Tissue Pressure Injury - Purple or maroon localized area of discolored intact skin or blood-filled blister due to damage of underlying soft tissue from pressure and/or shear. (Active)  Date First Assessed/Time First Assessed: 03/03/21 1114   Location: Heel  Location Orientation: Left  Staging: Deep Tissue Pressure  Injury - Purple or maroon localized area of discolored intact skin or blood-filled blister due to damage of underlying s...    Assessments 03/03/2021 11:15 AM 05/26/2021  4:51  AM  Dressing Type -- Gauze (Comment)  Dressing -- Clean, Dry, Intact  Dressing Change Frequency -- Twice a day  Wound Depth (cm) 0 cm --     No associated orders.     Pressure Injury 03/10/21 Toe (Comment  which one) Anterior;Left;Distal Unstageable - Full thickness tissue loss in which the base of the injury is covered by slough (yellow, tan, gray, green or brown) and/or eschar (tan, brown or black) in the wound bed. (Active)  Date First Assessed/Time First Assessed: 03/10/21 1730   Location: (c) Toe (Comment  which one)  Location Orientation: Anterior;Left;Distal  Staging: Unstageable - Full thickness tissue loss in which the base of the injury is covered by slough (yellow...    Assessments 03/10/2021  5:30 PM 05/26/2021  4:51 AM  Dressing Type Foam - Lift dressing to assess site every shift Gauze (Comment)  Dressing -- Clean, Dry, Intact  Dressing Change Frequency -- Twice a day  Site / Wound Assessment -- Black     No associated orders.     Pressure Injury 03/10/21 Toe (Comment  which one) Anterior;Right;Distal Unstageable - Full thickness tissue loss in which the base of the injury is covered by slough (yellow, tan, gray, green or brown) and/or eschar (tan, brown or black) in the wound bed (Active)  Date First Assessed/Time First Assessed: 03/10/21 1730   Location: (c) Toe (Comment  which one)  Location Orientation: Anterior;Right;Distal  Staging: Unstageable - Full thickness tissue loss in which the base of the injury is covered by slough (yello...    Assessments 03/21/2021  3:21 PM 05/26/2021  4:51 AM  Dressing Type Foam - Lift dressing to assess site every shift Gauze (Comment)  Dressing Clean;Dry Clean, Dry, Intact  Dressing Change Frequency PRN Twice a day  State of Healing Eschar --  Site / Wound Assessment Black;Dry Black  % Wound base Red or Granulating 0% --  % Wound base Yellow/Fibrinous Exudate 0% --  % Wound base Black/Eschar 100% --  % Wound base Other/Granulation Tissue  (Comment) 0% --  Peri-wound Assessment Intact --  Wound Length (cm) 1.5 cm --  Wound Width (cm) 1.5 cm --  Wound Depth (cm) 0 cm --  Wound Surface Area (cm^2) 2.25 cm^2 --  Wound Volume (cm^3) 0 cm^3 --  Tunneling (cm) 0 --  Undermining (cm) 0 --  Drainage Amount None --  Drainage Description No odor --     No associated orders.     Pressure Injury 04/03/21 Heel Right Deep Tissue Pressure Injury - Purple or maroon localized area of discolored intact skin or blood-filled blister due to damage of underlying soft tissue from pressure and/or shear. (Active)  Date First Assessed/Time First Assessed: 04/03/21 2000   Location: Heel  Location Orientation: Right  Staging: Deep Tissue Pressure Injury - Purple or maroon localized area of discolored intact skin or blood-filled blister due to damage of underlying ...    Assessments 04/03/2021  8:00 PM 05/26/2021  4:51 AM  Dressing Type Foam - Lift dressing to assess site every shift Gauze (Comment)  Dressing Clean, Dry, Intact Clean, Dry, Intact  Dressing Change Frequency -- Twice a day  Site / Wound Assessment Clean;Dry;Purple;Pink Clean;Dry  Peri-wound Assessment Intact;Erythema (blanchable) --  Margins Attached edges (approximated) --  Treatment Off loading --  No associated orders.     Wound / Incision (Open or Dehisced) 04/05/21 (MASD) Moisture Associated Skin Damage Groin Right;Left (Active)  Date First Assessed/Time First Assessed: 04/05/21 1900   Wound Type: (MASD) Moisture Associated Skin Damage  Location: Groin  Location Orientation: Right;Left  Present on Admission: Yes    Assessments 04/06/2021  7:00 PM 04/14/2021  9:30 AM  Dressing Type None None  Site / Wound Assessment Dry;Pink;Red Dry;Pink  Peri-wound Assessment Intact --  Margins Attached edges (approximated) Attached edges (approximated)  Drainage Amount None None  Treatment Other (Comment);Cleansed --     No associated orders.    Discharge Exam:   Vitals:   08/16/21  0730 08/16/21 1206 08/16/21 1516 08/16/21 1517  BP: (!) 110/52 118/75 121/72 121/72  Pulse: 69 76 79 79  Resp: '17 18 18 18  '$ Temp: 98.1 F (36.7 C) 98.2 F (36.8 C) 98.3 F (36.8 C) 98 F (36.7 C)  TempSrc: Oral Oral  Oral  SpO2: 96% 97%  96%  Weight:      Height:        Body mass index is 21.7 kg/m.   General exam: Pleasant, elderly Caucasian male.  Not in distress Skin: No rashes, lesions or ulcers. HEENT: Atraumatic, normocephalic, no obvious bleeding Lungs: Clear to auscultation bilaterally.  Shallow respiratory effort. CVS: Regular rate and rhythm, no murmur GI/Abd soft, nontender, nondistended, bowel sound present.  PEG tube site intact CNS: Alert, awake, slow to respond, hard of hearing Psychiatry: Mood appropriate Extremities: No pedal edema, no calf tenderness  Follow ups:    Follow-up Information     Orpah Greek, DO. Go to.   Specialty: Internal Medicine Why: Appointment on Tuesday, 08/22/21 at 10:00am Contact information: [place] [address] [phone]         Martinique, Peter M, MD .   Specialty: Cardiology Contact information: [address] [place] [address] [phone]                 Discharge Instructions:   Discharge Instructions     Call MD for:  difficulty breathing, headache or visual disturbances   Complete by: As directed    Call MD for:  extreme fatigue   Complete by: As directed    Call MD for:  hives   Complete by: As directed    Call MD for:  persistant dizziness or light-headedness   Complete by: As directed    Call MD for:  persistant nausea and vomiting   Complete by: As directed    Call MD for:  severe uncontrolled pain   Complete by: As directed    Call MD for:  temperature >100.4   Complete by: As directed    Diet general   Complete by: As directed    PEG tube feeding   Discharge instructions   Complete by: As directed    General discharge instructions: Follow  with Primary MD Orpah Greek, DO in 7 days  Please request your PCP  to go over your hospital tests, procedures, radiology results at the follow up. Please get your medicines reviewed and adjusted.  Your PCP may decide to repeat certain labs or tests as needed. Do not drive, operate heavy machinery, perform activities at heights, swimming or participation in water activities or provide baby sitting services if your were admitted for syncope or siezures until you have seen by Primary MD or a Neurologist and advised to do so again. [place] Controlled  Substance Reporting System database was reviewed. Do not drive, operate heavy machinery, perform activities at heights, swim, participate in water activities or provide baby-sitting services while on medications for pain, sleep and mood until your outpatient physician has reevaluated you and advised to do so again.  You are strongly recommended to comply with the dose, frequency and duration of prescribed medications. Activity: As tolerated with Full fall precautions use walker/cane & assistance as needed Avoid using any recreational substances like cigarette, tobacco, alcohol, or non-prescribed drug. If you experience worsening of your admission symptoms, develop shortness of breath, life threatening emergency, suicidal or homicidal thoughts you must seek medical attention immediately by calling 911 or calling your MD immediately  if symptoms less severe. You must read complete instructions/literature along with all the possible adverse reactions/side effects for all the medicines you take and that have been prescribed to you. Take any new medicine only after you have completely understood and accepted all the possible adverse reactions/side effects.  Wear Seat belts while driving. You were cared for by a hospitalist during your hospital stay. If you have any questions about your discharge medications or the care you received while you were in  the hospital after you are discharged, you can call the unit and ask to speak with the hospitalist or the covering physician. Once you are discharged, your primary care physician will handle any further medical issues. Please note that NO REFILLS for any discharge medications will be authorized once you are discharged, as it is imperative that you return to your primary care physician (or establish a relationship with a primary care physician if you do not have one).   Discharge wound care:   Complete by: As directed    Increase activity slowly   Complete by: As directed        Discharge Medications:   Allergies as of 08/16/2021       Reactions   Cefepime Other (See Comments)   Severe myoclonus for cefepime- don't take again-    Amlodipine Besylate    Other reaction(s): edema   Penicillins Hives, Rash        Medication List     STOP taking these medications    sucralfate 1 GM/10ML suspension Commonly known as: CARAFATE       TAKE these medications    atorvastatin 40 MG tablet Commonly known as: LIPITOR Take 40 mg by mouth daily.   chlorhexidine 0.12 % solution Commonly known as: PERIDEX 15 mLs by Mouth Rinse route 2 (two) times daily.   clopidogrel 75 MG tablet Commonly known as: PLAVIX Place 1 tablet (75 mg total) into feeding tube daily.   famotidine 40 MG/5ML suspension Commonly known as: PEPCID Place 2.5 mLs (20 mg total) into feeding tube at bedtime.   feeding supplement (NEPRO CARB STEADY) Liqd Place 315 mLs into feeding tube 3 (three) times daily. What changed: how much to take   ferrous sulfate 300 (60 Fe) MG/5ML syrup Take by mouth.   free water Soln Place 300 mLs into feeding tube 4 (four) times daily.   furosemide 20 MG tablet Commonly known as: LASIX Take 1 tablet Per J Tube daily as needed for edema or fluid (Take as needed for shortness of breath, leg swelling or weight gain 3 lbs in 24-48 hrs to 5 lbs in 7 days.).   levothyroxine 75 MCG  tablet Commonly known as: SYNTHROID Place 1 tablet (75 mcg total) into feeding tube daily at 6 (six) AM.   melatonin  5 MG Tabs Place 1 tablet (5 mg total) into feeding tube at bedtime as needed. What changed: reasons to take this   potassium chloride 20 MEQ/15ML (10%) Soln Take by mouth.   PROBIOTIC PO Give 1 capsule by tube daily.   senna-docusate 8.6-50 MG tablet Commonly known as: Senokot-S Place 2 tablets into feeding tube at bedtime.               Discharge Care Instructions  (From admission, onward)           Start     Ordered   08/16/21 0000  Discharge wound care:        08/16/21 1232             The results of significant diagnostics from this hospitalization (including imaging, microbiology, ancillary and laboratory) are listed below for reference.    Procedures and Diagnostic Studies:   DG Chest 2 View  Result Date: 08/14/2021 CLINICAL DATA:  Weakness, cough EXAM: CHEST - 2 VIEW COMPARISON:  05/24/2021 FINDINGS: Lung volumes are low. Bibasilar opacities and small pleural effusions. Interstitial prominence. Similar cardiomediastinal contours. Left chest wall pacemaker. IMPRESSION: Bibasilar atelectasis/consolidation. Small pleural effusions. Chronic interstitial changes with possible superimposed edema. Electronically Signed   By: Macy Mis M.D.   On: 08/14/2021 11:45     Labs:   Basic Metabolic Panel: Recent Labs  Lab 08/14/21 1057 08/15/21 0555 08/16/21 0600  NA 143 143 147*  K 4.4 3.8 3.8  CL 114* 110 114*  CO2 21* 25 25  GLUCOSE 100* 97 125*  BUN 91* 97* 90*  CREATININE 1.66* 1.68* 1.49*  CALCIUM 9.5 9.5 10.1   GFR Estimated Creatinine Clearance: 26.9 mL/min (A) (by C-G formula based on SCr of 1.49 mg/dL (H)). Liver Function Tests: Recent Labs  Lab 08/14/21 1057  AST 41  ALT 24  ALKPHOS 63  BILITOT 0.8  PROT 7.6  ALBUMIN 3.6   No results for input(s): "LIPASE", "AMYLASE" in the last 168 hours. No results for  input(s): "AMMONIA" in the last 168 hours. Coagulation profile Recent Labs  Lab 08/14/21 1057  INR 1.3*    CBC: Recent Labs  Lab 08/14/21 1057 08/15/21 0555 08/16/21 0600  WBC 9.3 9.3 9.2  NEUTROABS 6.7  --  6.7  HGB 8.2* 8.1* 8.4*  HCT 26.5* 26.1* 26.6*  MCV 97.4 97.0 96.4  PLT 127* 124* 126*   Cardiac Enzymes: No results for input(s): "CKTOTAL", "CKMB", "CKMBINDEX", "TROPONINI" in the last 168 hours. BNP: Invalid input(s): "POCBNP" CBG: Recent Labs  Lab 08/15/21 0831 08/15/21 1206 08/15/21 1523 08/15/21 2029  GLUCAP 99 140* 107* 126*   D-Dimer No results for input(s): "DDIMER" in the last 72 hours. Hgb A1c Recent Labs    08/15/21 0555  HGBA1C 5.8*   Lipid Profile Recent Labs    08/15/21 0555  CHOL 99  HDL 47  LDLCALC 47  TRIG 23  CHOLHDL 2.1   Thyroid function studies No results for input(s): "TSH", "T4TOTAL", "T3FREE", "THYROIDAB" in the last 72 hours.  Invalid input(s): "FREET3" Anemia work up No results for input(s): "VITAMINB12", "FOLATE", "FERRITIN", "TIBC", "IRON", "RETICCTPCT" in the last 72 hours. Microbiology Recent Results (from the past 240 hour(s))  SARS Coronavirus 2 by RT PCR (hospital order, performed in [facility] hospital lab) *cepheid single result test* Anterior Nasal Swab     Status: None   Collection Time: 08/14/21  1:20 PM   Specimen: Anterior Nasal Swab  Result Value Ref Range Status   SARS  Coronavirus 2 by RT PCR NEGATIVE NEGATIVE Final    Comment: (NOTE) SARS-CoV-2 target nucleic acids are NOT DETECTED.  The SARS-CoV-2 RNA is generally detectable in upper and lower respiratory specimens during the acute phase of infection. The lowest concentration of SARS-CoV-2 viral copies this assay can detect is 250 copies / mL. A negative result does not preclude SARS-CoV-2 infection and should not be used as the sole basis for treatment or other patient management decisions.  A negative result may occur with improper specimen  collection / handling, submission of specimen other than nasopharyngeal swab, presence of viral mutation(s) within the areas targeted by this assay, and inadequate number of viral copies (<250 copies / mL). A negative result must be combined with clinical observations, patient history, and epidemiological information.  Fact Sheet for Patients:   https://www.patel.info/  Fact Sheet for Healthcare Providers: https://hall.com/  This test is not yet approved or  cleared by the [place] FDA and has been authorized for detection and/or diagnosis of SARS-CoV-2 by FDA under an Emergency Use Authorization (EUA).  This EUA will remain in effect (meaning this test can be used) for the duration of the COVID-19 declaration under Section 564(b)(1) of the Act, 21 U.S.C. section 360bbb-3(b)(1), unless the authorization is terminated or revoked sooner.  Performed at [facility], [address] [place], [place] [address]     Time coordinating discharge: 35 minutes  Signed: Terrilee Croak  [facility] 08/16/2021, 3:45 PM

## 2021-08-16 NOTE — Progress Notes (Signed)
PT Cancellation Note  Patient Details Name: Gregory [name] MRN: [id number] DOB: Jul 06, 1920   Cancelled Treatment:    Reason Eval/Treat Not Completed: Other (comment) PT screened pt this date. Pt and family politely declined physical therapy. Discussed how pt would get into vehicle at discharge, as nursing will not conduct a total assist lift into vehicle. Pts family stated it takes 3 of them, but they will handle lifting him up and into the vehicle themselves as that is typically what they do at home to transfer him to/from the bed to the wheelchair. Discussed if pt/family had any equipment needs to better assist them with mobility. Pts family stated they have everything they need including an adjustable bed, wheelchair, walker, etc. Pt stated he does not want physical therapy. PT orders discharged at this time.  Maimuna Leaman, SPT  Mazen Marcin 08/16/2021, 11:01 AM

## 2021-08-16 NOTE — Progress Notes (Signed)
OT Cancellation Note  Patient Details Name: Gregory [name] MRN: [id number] DOB: 1920/06/22   Cancelled Treatment:    Reason Eval/Treat Not Completed: Other (comment). Pt's family feels that they have all needed equipment and  that they are able to provide assist as needed at discharge. Family declines evaluation this admission. OT to SIGN OFF.   Darleen Crocker, MS, OTR/L , CBIS ascom [phone]  08/16/21, 11:17 AM

## 2021-08-25 ENCOUNTER — Telehealth: Payer: Self-pay

## 2021-08-25 ENCOUNTER — Ambulatory Visit: Payer: Medicare Other | Admitting: Nurse Practitioner

## 2021-08-25 NOTE — Telephone Encounter (Signed)
Called pt's daughter and son several times to notify them that pts appointment needed to be r/s due to Diona Browner, DNP having a family emergency Friday 08/25/21. Called on 08/24/21 and spoke with pts son. Per pts son, his sister is the person that discusses pts appointments and he would have her call our office back. I phoned pts daughter again today 08/25/21 @ 10:30 AM with no answer. A voicemail was left several times.   Pt showed up to see Diona Browner, NP today 08/25/21 at his scheduled appointment time. After pt was arrived in our system, I spoke with pts daughter and let her know that our office has tried to contact them several times due to Diona Browner, [facility] being canceled. They were understanding and pt was scheduled to see Odie Sera NP on Tuesday at 2:45 PM.

## 2021-08-28 NOTE — Progress Notes (Deleted)
Cardiology Clinic Note   Patient Name: Gregory [name] Date of Encounter: 08/28/2021  Primary Care Provider:  Orpah Greek, DO Primary Cardiologist:  Peter Martinique, MD  Patient Profile     Gregory [name] 88 year old male presents to the clinic today for follow-up evaluation of his paroxysmal atrial fibrillation and acute on chronic diastolic CHF.  Past Medical History    Past Medical History:  Diagnosis Date   Atrial flutter [facility])    s/p ablation   Cancer [facility])    skin cancer   Cataracts, bilateral    Hypertension    Neuromuscular disorder ([place])    Pacemaker 2010   Thyroid disease    Past Surgical History:  Procedure Laterality Date   APPENDECTOMY     COLONOSCOPY     EYE SURGERY Bilateral    cataract surgery   HERNIA REPAIR Right    INGUINAL HERNIA REPAIR Left 10/12/2013   Procedure: LEFT  INGUINAL HERNIA REPAIR;  Surgeon: Odis Hollingshead, MD;  Location: [place];  Service: General;  Laterality: Left;   INSERTION OF MESH Left 10/12/2013   Procedure: INSERTION OF MESH;  Surgeon: Odis Hollingshead, MD;  Location: [place];  Service: General;  Laterality: Left;   PEG TUBE PLACEMENT Left     Allergies  Allergies  Allergen Reactions   Cefepime Other (See Comments)    Severe myoclonus for cefepime- don't take again-    Amlodipine Besylate     Other reaction(s): edema   Penicillins Hives and Rash    History of Present Illness     Gregory [name] is a PMH of hyperlipidemia, hypertension, peripheral vascular disease, diastolic CHF, atrial fibrillation status post ablation, status post PPM not on anticoagulation, aortic stenosis status post TAVR, GI bleed, CKD stage III, chronic anemia, hypothyroidism, and dysphagia status post G-tube placement.  He presented to the emergency department via EMS on 08/15/2021 and was discharged on 08/16/2021.  His heart rate was in the 70s-80s with respirations in the 20s and low normal blood pressure on room air.  His chest x-ray  showed bibasilar atelectasis with consolidation and small pleural effusions as well as chronic interstitial changes.  His EKG showed paced rhythm with a QTc of 506 ms.  He was admitted to hospital service.  He was diagnosed with acute on chronic diastolic CHF.  He was brought from home with progressive worsening weakness and productive cough x4 days.  He was noted to have trace leg edema, positive JVD, crackles on auscultation and elevated BNP.  He received IV diuresis.  His echocardiogram 05/20/2021 showed an EF of 55%.  At discharge he was -3391 mL.  It was felt that his elevated troponins were due to demand ischemia in the setting of LV strain his Plavix and atorvastatin for continued.  He presents to the clinic today for follow-up evaluation states***  *** denies chest pain, shortness of breath, lower extremity edema, fatigue, palpitations, melena, hematuria, hemoptysis, diaphoresis, weakness, presyncope, syncope, orthopnea, and PND.  Acute on chronic diastolic CHF-euvolemic today.  Weight stable.  No increased DOE.  Echocardiogram 05/20/2021 showed LVEF of 55% and well-functioning TAVR valve. Continue furosemide, potassium Heart healthy low-sodium diet-salty 6 given Increase physical activity as tolerated   Essential hypertension-BP today***.  Well-controlled at home. Continue blood pressure log Heart healthy low-sodium diet-salty 6 given Increase physical activity as tolerated   Hyperlipidemia-LDL*** Continue atorvastatin, Plavix Heart healthy low-sodium high-fiber diet Increase physical activity as tolerated   Paroxysmal atrial fibrillation-heart rate today*** Continue  Plavix Heart healthy low-sodium diet-salty 6 given Increase physical activity as tolerated Avoid triggers caffeine, chocolate, EtOH, dehydration etc.  CKD stage III-creatinine 1.49 on 08/16/2021.  Baseline creatinine around 1.1. Follows with PCP  Disposition: Follow-up with Dr. Martinique in 3-4 months.  Echocardiogram  05/20/2021  IMPRESSIONS     1. Left pleural effuson.   2. Pacing wire in RA/RV.   3. Left ventricular ejection fraction, by estimation, is 55%. The left  ventricle has normal function. The left ventricle has no regional wall  motion abnormalities. There is mild left ventricular hypertrophy. Left  ventricular diastolic parameters are  indeterminate.   4. Right ventricular systolic function is normal. The right ventricular  size is normal. There is severely elevated pulmonary artery systolic  pressure.   5. Left atrial size was moderately dilated.   6. Right atrial size was moderately dilated.   7. The mitral valve is abnormal. Trivial mitral valve regurgitation. No  evidence of mitral stenosis.   8. Tricuspid valve regurgitation is moderate.   9. Post TAVR 26 mm Sapien trivial PVL acceptable systolic gradients. The  aortic valve has been repaired/replaced. Aortic valve regurgitation is not  visualized. No aortic stenosis is present. There is a 26 mm Edwards Sapien  prosthetic (TAVR) valve  present in the aortic position. Procedure Date: 10/28/18.  10. The inferior vena cava is dilated in size with >50% respiratory  variability, suggesting right atrial pressure of 8 mmHg.  Home Medications    Prior to Admission medications   Medication Sig Start Date End Date Taking? Authorizing Provider  atorvastatin (LIPITOR) 40 MG tablet Take 40 mg by mouth daily.    [provider]  chlorhexidine (PERIDEX) 0.12 % solution 15 mLs by Mouth Rinse route 2 (two) times daily. 04/05/21   Lajean Manes, MD  clopidogrel (PLAVIX) 75 MG tablet Place 1 tablet (75 mg total) into feeding tube daily. 05/26/21   Arrien, Jimmy Picket, MD  famotidine (PEPCID) 40 MG/5ML suspension Place 2.5 mLs (20 mg total) into feeding tube at bedtime. 05/26/21   Arrien, Jimmy Picket, MD  ferrous sulfate 300 (60 Fe) MG/5ML syrup Take by mouth. 07/07/21 07/07/22  [provider]  furosemide (LASIX) 20 MG tablet  Take 1 tablet Per J Tube daily as needed for edema or fluid (Take as needed for shortness of breath, leg swelling or weight gain 3 lbs in 24-48 hrs to 5 lbs in 7 days.). 05/26/21   Arrien, Jimmy Picket, MD  levothyroxine (SYNTHROID) 75 MCG tablet Place 1 tablet (75 mcg total) into feeding tube daily at 6 (six) AM. 04/06/21   Lajean Manes, MD  melatonin 5 MG TABS Place 1 tablet (5 mg total) into feeding tube at bedtime as needed. Patient taking differently: Place 5 mg into feeding tube at bedtime as needed (sleep). 04/14/21   Love, Ivan Anchors, PA-C  Nutritional Supplements (FEEDING SUPPLEMENT, NEPRO CARB STEADY,) LIQD Place 315 mLs into feeding tube 3 (three) times daily. Patient taking differently: Place 395 mLs into feeding tube 3 (three) times daily. 04/05/21   Lajean Manes, MD  potassium chloride 20 MEQ/15ML (10%) SOLN Take by mouth. 07/07/21 07/07/22  [provider]  Probiotic Product (PROBIOTIC PO) Give 1 capsule by tube daily.    [provider]  senna-docusate (SENOKOT-S) 8.6-50 MG tablet Place 2 tablets into feeding tube at bedtime. 04/14/21   Love, Ivan Anchors, PA-C  Water For Irrigation, Sterile (FREE WATER) SOLN Place 300 mLs into feeding tube 4 (four) times daily. 04/05/21  Lajean Manes, MD    Family History    Family History  Problem Relation Age of Onset   Heart disease Mother    Cancer Father        leukemia   He indicated that his mother is deceased. He indicated that his father is deceased. He indicated that his maternal grandmother is deceased. He indicated that his maternal grandfather is deceased. He indicated that his paternal grandmother is deceased. He indicated that his paternal grandfather is deceased.  Social History    Social History   Socioeconomic History   Marital status: Widowed    Spouse name: Not on file   Number of children: 2   Years of education: Not on file   Highest education level: Not on file  Occupational History   Occupation: retired  Actor  Tobacco Use   Smoking status: Never   Smokeless tobacco: Never  Vaping Use   Vaping Use: Never used  Substance and Sexual Activity   Alcohol use: No   Drug use: No   Sexual activity: Not Currently  Other Topics Concern   Not on file  Social History Narrative   Not on file   Social Determinants of Health   Financial Resource Strain: Not on file  Food Insecurity: Not on file  Transportation Needs: Not on file  Physical Activity: Not on file  Stress: Not on file  Social Connections: Not on file  Intimate Partner Violence: Not on file     Review of Systems    General:  No chills, fever, night sweats or weight changes.  Cardiovascular:  No chest pain, dyspnea on exertion, edema, orthopnea, palpitations, paroxysmal nocturnal dyspnea. Dermatological: No rash, lesions/masses Respiratory: No cough, dyspnea Urologic: No hematuria, dysuria Abdominal:   No nausea, vomiting, diarrhea, bright red blood per rectum, melena, or hematemesis Neurologic:  No visual changes, wkns, changes in mental status. All other systems reviewed and are otherwise negative except as noted above.  Physical Exam    VS:  There were no vitals taken for this visit. , BMI There is no height or weight on file to calculate BMI. GEN: Well nourished, well developed, in no acute distress. HEENT: normal. Neck: Supple, no JVD, carotid bruits, or masses. Cardiac: RRR, no murmurs, rubs, or gallops. No clubbing, cyanosis, edema.  Radials/DP/PT 2+ and equal bilaterally.  Respiratory:  Respirations regular and unlabored, clear to auscultation bilaterally. GI: Soft, nontender, nondistended, BS + x 4. MS: no deformity or atrophy. Skin: warm and dry, no rash. Neuro:  Strength and sensation are intact. Psych: Normal affect.  Accessory Clinical Findings    Recent Labs: 03/02/2021: TSH 3.233 05/22/2021: Magnesium 2.2 08/14/2021: ALT 24; B Natriuretic Peptide 1,034.7 08/16/2021: BUN 90; Creatinine, Ser 1.49;  Hemoglobin 8.4; Platelets 126; Potassium 3.8; Sodium 147   Recent Lipid Panel    Component Value Date/Time   CHOL 99 08/15/2021 0555   TRIG 23 08/15/2021 0555   HDL 47 08/15/2021 0555   CHOLHDL 2.1 08/15/2021 0555   VLDL 5 08/15/2021 0555   LDLCALC 47 08/15/2021 0555    ECG personally reviewed by me today- *** - No acute changes  Assessment & Plan   1.  ***   Jossie Ng. Denelda Akerley NP-C     08/28/2021, 7:45 AM [place] [address] Office [phone] Fax [phone]  Notice: This dictation was prepared with Dragon dictation along with smaller phrase technology. Any transcriptional errors that result from this process are unintentional  and may not be corrected upon review.  I spent***minutes examining this patient, reviewing medications, and using patient centered shared decision making involving her cardiac care.  Prior to her visit I spent greater than 20 minutes reviewing her past medical history,  medications, and prior cardiac tests.

## 2021-08-29 ENCOUNTER — Ambulatory Visit: Payer: Medicare Other | Admitting: General Practice

## 2021-08-31 ENCOUNTER — Encounter (HOSPITAL_BASED_OUTPATIENT_CLINIC_OR_DEPARTMENT_OTHER): Payer: Medicare Other | Attending: General Surgery | Admitting: General Surgery

## 2021-08-31 DIAGNOSIS — I4891 Unspecified atrial fibrillation: Secondary | ICD-10-CM | POA: Diagnosis not present

## 2021-08-31 DIAGNOSIS — E43 Unspecified severe protein-calorie malnutrition: Secondary | ICD-10-CM | POA: Diagnosis not present

## 2021-08-31 DIAGNOSIS — I70263 Atherosclerosis of native arteries of extremities with gangrene, bilateral legs: Secondary | ICD-10-CM | POA: Insufficient documentation

## 2021-08-31 DIAGNOSIS — Z950 Presence of cardiac pacemaker: Secondary | ICD-10-CM | POA: Diagnosis not present

## 2021-08-31 DIAGNOSIS — I129 Hypertensive chronic kidney disease with stage 1 through stage 4 chronic kidney disease, or unspecified chronic kidney disease: Secondary | ICD-10-CM | POA: Insufficient documentation

## 2021-08-31 DIAGNOSIS — L97522 Non-pressure chronic ulcer of other part of left foot with fat layer exposed: Secondary | ICD-10-CM | POA: Diagnosis not present

## 2021-08-31 DIAGNOSIS — L97512 Non-pressure chronic ulcer of other part of right foot with fat layer exposed: Secondary | ICD-10-CM | POA: Diagnosis not present

## 2021-08-31 DIAGNOSIS — L89612 Pressure ulcer of right heel, stage 2: Secondary | ICD-10-CM | POA: Diagnosis present

## 2021-08-31 DIAGNOSIS — N1831 Chronic kidney disease, stage 3a: Secondary | ICD-10-CM | POA: Insufficient documentation

## 2021-09-01 ENCOUNTER — Ambulatory Visit: Payer: Medicare Other | Admitting: Family

## 2021-09-04 NOTE — Progress Notes (Signed)
Gregory [name], Gregory [name] ([id number]) Visit Report for 08/31/2021 Chief Complaint Document Details Patient Name: Date of Service: Gregory [name] [facility] LD 08/31/2021 12:30 PM Medical Record Number: [id number] Patient Account Number: [id number] Date of Birth/Sex: Treating RN: October 22, 1920 (88 y.o. Gregory [name] Primary Care Provider: Wylene Simmer Other Clinician: Referring Provider: Treating Provider/Extender: Roxy Cedar in Treatment: 16 Information Obtained from: Patient Chief Complaint Patient is at the clinic for treatment of an open pressure ulcer on his left heel and dry gangrene of both great toes Electronic Signature(s) Signed: 08/31/2021 1:51:09 PM By: Fredirick Maudlin MD FACS Entered By: Fredirick Maudlin on 08/31/2021 13:51:09 -------------------------------------------------------------------------------- Debridement Details Patient Name: Date of Service: Gregory [name], Gregory [name] LD 08/31/2021 12:30 PM Medical Record Number: [id number] Patient Account Number: [id number] Date of Birth/Sex: Treating RN: 05/08/1920 (88 y.o. Gregory [name] Primary Care Provider: Wylene Simmer Other Clinician: Referring Provider: Treating Provider/Extender: Roxy Cedar in Treatment: 16 Debridement Performed for Assessment: Wound #6 Right Calcaneus Performed By: Physician Fredirick Maudlin, MD Debridement Type: Debridement Level of Consciousness (Pre-procedure): Awake and Alert Pre-procedure Verification/Time Out Yes - 13:18 Taken: Start Time: 13:22 Pain Control: Lidocaine 4% T opical Solution T Area Debrided (L x W): otal 0.3 (cm) x 0.3 (cm) = 0.09 (cm) Tissue and other material debrided: Non-Viable, Eschar, Slough, Slough Level: Non-Viable Tissue Debridement Description: Selective/Open Wound Instrument: Curette Bleeding: Minimum Hemostasis Achieved: Pressure Procedural Pain: 0 Post Procedural Pain: 0 Response to Treatment: Procedure was  tolerated well Level of Consciousness (Post- Awake and Alert procedure): Post Debridement Measurements of Total Wound Length: (cm) 0.3 Stage: Category/Stage II Width: (cm) 0.3 Depth: (cm) 0.1 Volume: (cm) 0.007 Character of Wound/Ulcer Post Debridement: Improved Post Procedure Diagnosis Same as Pre-procedure Electronic Signature(s) Signed: 08/31/2021 4:26:52 PM By: Fredirick Maudlin MD FACS Signed: 08/31/2021 5:36:35 PM By: Sharyn Creamer RN, BSN Entered By: Sharyn Creamer on 08/31/2021 13:23:40 -------------------------------------------------------------------------------- Debridement Details Patient Name: Date of Service: Gregory [name], Gregory [name] LD 08/31/2021 12:30 PM Medical Record Number: [id number] Patient Account Number: [id number] Date of Birth/Sex: Treating RN: 1920/02/21 (88 y.o. Gregory [name] Primary Care Provider: Wylene Simmer Other Clinician: Referring Provider: Treating Provider/Extender: Roxy Cedar in Treatment: 16 Debridement Performed for Assessment: Wound #7 Left,Lateral Foot Performed By: Physician Fredirick Maudlin, MD Debridement Type: Debridement Level of Consciousness (Pre-procedure): Awake and Alert Pre-procedure Verification/Time Out Yes - 13:18 Taken: Start Time: 13:22 Pain Control: Lidocaine 4% T opical Solution T Area Debrided (L x W): otal 1.3 (cm) x 1.9 (cm) = 2.47 (cm) Tissue and other material debrided: Non-Viable, Eschar, Subcutaneous Level: Skin/Subcutaneous Tissue Debridement Description: Excisional Instrument: Curette Bleeding: Minimum Hemostasis Achieved: Pressure Procedural Pain: 0 Post Procedural Pain: 0 Response to Treatment: Procedure was tolerated well Level of Consciousness (Post- Awake and Alert procedure): Post Debridement Measurements of Total Wound Length: (cm) 1.3 Stage: Category/Stage II Width: (cm) 1.9 Depth: (cm) 0.1 Volume: (cm) 0.194 Character of Wound/Ulcer Post Debridement:  Improved Post Procedure Diagnosis Same as Pre-procedure Electronic Signature(s) Signed: 08/31/2021 4:26:52 PM By: Fredirick Maudlin MD FACS Signed: 08/31/2021 5:36:35 PM By: Sharyn Creamer RN, BSN Entered By: Sharyn Creamer on 08/31/2021 13:24:18 -------------------------------------------------------------------------------- Debridement Details Patient Name: Date of Service: Gregory [name], Gregory [name] LD 08/31/2021 12:30 PM Medical Record Number: [id number] Patient Account Number: [id number] Date of Birth/Sex: Treating RN: [birth year] (88 y.o. Gregory [name] Primary Care Provider: Wylene Simmer Other Clinician: Referring Provider: Treating Provider/Extender: Roxy Cedar in Treatment: 16 Debridement Performed for Assessment: Wound #4 Right,Lateral  Foot Performed By: Physician Fredirick Maudlin, MD Debridement Type: Debridement Level of Consciousness (Pre-procedure): Awake and Alert Pre-procedure Verification/Time Out Yes - 13:18 Taken: Start Time: 13:22 Pain Control: Lidocaine 4% T opical Solution T Area Debrided (L x W): otal 2.2 (cm) x 1.2 (cm) = 2.64 (cm) Tissue and other material debrided: Non-Viable, Eschar, Slough, Slough Level: Non-Viable Tissue Debridement Description: Selective/Open Wound Instrument: Curette Bleeding: Minimum Hemostasis Achieved: Pressure Procedural Pain: 0 Post Procedural Pain: 0 Response to Treatment: Procedure was tolerated well Level of Consciousness (Post- Awake and Alert procedure): Post Debridement Measurements of Total Wound Length: (cm) 2.2 Stage: Category/Stage II Width: (cm) 1.2 Depth: (cm) 0.2 Volume: (cm) 0.415 Character of Wound/Ulcer Post Debridement: Improved Post Procedure Diagnosis Same as Pre-procedure Electronic Signature(s) Signed: 08/31/2021 4:26:52 PM By: Fredirick Maudlin MD FACS Signed: 08/31/2021 5:36:35 PM By: Sharyn Creamer RN, BSN Entered By: Sharyn Creamer on 08/31/2021  13:25:22 -------------------------------------------------------------------------------- Debridement Details Patient Name: Date of Service: Gregory [name], Gregory [name] LD 08/31/2021 12:30 PM Medical Record Number: [id number] Patient Account Number: [id number] Date of Birth/Sex: Treating RN: 08-28-1920 (88 y.o. Gregory [name] Primary Care Provider: Wylene Simmer Other Clinician: Referring Provider: Treating Provider/Extender: Roxy Cedar in Treatment: 16 Debridement Performed for Assessment: Wound #1 Right T Great oe Performed By: Physician Fredirick Maudlin, MD Debridement Type: Debridement Severity of Tissue Pre Debridement: Fat layer exposed Level of Consciousness (Pre-procedure): Awake and Alert Pre-procedure Verification/Time Out Yes - 13:18 Taken: Start Time: 13:22 Pain Control: Lidocaine 4% T opical Solution T Area Debrided (L x W): otal 0.8 (cm) x 1.3 (cm) = 1.04 (cm) Tissue and other material debrided: Non-Viable, Slough, Subcutaneous, Slough Level: Skin/Subcutaneous Tissue Debridement Description: Excisional Instrument: Curette Bleeding: Minimum Hemostasis Achieved: Pressure Procedural Pain: 0 Post Procedural Pain: 0 Response to Treatment: Procedure was tolerated well Level of Consciousness (Post- Awake and Alert procedure): Post Debridement Measurements of Total Wound Length: (cm) 0.8 Width: (cm) 1.3 Depth: (cm) 0.1 Volume: (cm) 0.082 Character of Wound/Ulcer Post Debridement: Improved Severity of Tissue Post Debridement: Fat layer exposed Post Procedure Diagnosis Same as Pre-procedure Electronic Signature(s) Signed: 08/31/2021 4:26:52 PM By: Fredirick Maudlin MD FACS Signed: 08/31/2021 5:36:35 PM By: Sharyn Creamer RN, BSN Entered By: Sharyn Creamer on 08/31/2021 13:26:45 -------------------------------------------------------------------------------- Debridement Details Patient Name: Date of Service: Gregory [name], Gregory [name] LD 08/31/2021  12:30 PM Medical Record Number: [id number] Patient Account Number: [id number] Date of Birth/Sex: Treating RN: [birth year] (88 y.o. Gregory [name] Primary Care Provider: Wylene Simmer Other Clinician: Referring Provider: Treating Provider/Extender: Roxy Cedar in Treatment: 16 Debridement Performed for Assessment: Wound #5 Right,Medial T Great oe Performed By: Physician Fredirick Maudlin, MD Debridement Type: Debridement Level of Consciousness (Pre-procedure): Awake and Alert Pre-procedure Verification/Time Out Yes - 13:18 Taken: Start Time: 13:22 Pain Control: Lidocaine 4% T opical Solution T Area Debrided (L x W): otal 1.9 (cm) x 2 (cm) = 3.8 (cm) Tissue and other material debrided: Non-Viable, Eschar, Slough, Slough Level: Non-Viable Tissue Debridement Description: Selective/Open Wound Instrument: Curette Bleeding: Minimum Hemostasis Achieved: Pressure Procedural Pain: 0 Post Procedural Pain: 0 Response to Treatment: Procedure was tolerated well Level of Consciousness (Post- Awake and Alert procedure): Post Debridement Measurements of Total Wound Length: (cm) 1.9 Stage: Category/Stage II Width: (cm) 2 Depth: (cm) 0.1 Volume: (cm) 0.298 Character of Wound/Ulcer Post Debridement: Improved Post Procedure Diagnosis Same as Pre-procedure Electronic Signature(s) Signed: 08/31/2021 4:26:52 PM By: Fredirick Maudlin MD FACS Signed: 08/31/2021 5:36:35 PM By: Sharyn Creamer RN, BSN Entered By: Sharyn Creamer on  08/31/2021 13:27:25 -------------------------------------------------------------------------------- Debridement Details Patient Name: Date of Service: Gregory [name] [name] LD 08/31/2021 12:30 PM Medical Record Number: [id number] Patient Account Number: [id number] Date of Birth/Sex: Treating RN: 1920/04/01 (88 y.o. Gregory [name] Primary Care Provider: Wylene Simmer Other Clinician: Referring Provider: Treating Provider/Extender:  Roxy Cedar in Treatment: 16 Debridement Performed for Assessment: Wound #2 Left T Great oe Performed By: Physician Fredirick Maudlin, MD Debridement Type: Debridement Severity of Tissue Pre Debridement: Fat layer exposed Level of Consciousness (Pre-procedure): Awake and Alert Pre-procedure Verification/Time Out Yes - 13:18 Taken: Start Time: 13:22 Pain Control: Lidocaine 4% T opical Solution T Area Debrided (L x W): otal 0.8 (cm) x 0.7 (cm) = 0.56 (cm) Tissue and other material debrided: Non-Viable, Eschar, Slough, Slough Level: Non-Viable Tissue Debridement Description: Selective/Open Wound Instrument: Curette Bleeding: Minimum Hemostasis Achieved: Pressure Procedural Pain: 0 Post Procedural Pain: 0 Response to Treatment: Procedure was tolerated well Level of Consciousness (Post- Awake and Alert procedure): Post Debridement Measurements of Total Wound Length: (cm) 0.8 Width: (cm) 0.7 Depth: (cm) 0.1 Volume: (cm) 0.044 Character of Wound/Ulcer Post Debridement: Improved Severity of Tissue Post Debridement: Fat layer exposed Post Procedure Diagnosis Same as Pre-procedure Electronic Signature(s) Signed: 08/31/2021 4:26:52 PM By: Fredirick Maudlin MD FACS Signed: 08/31/2021 5:36:35 PM By: Sharyn Creamer RN, BSN Entered By: Sharyn Creamer on 08/31/2021 13:29:12 -------------------------------------------------------------------------------- HPI Details Patient Name: Date of Service: Gregory [name], Gregory [name] LD 08/31/2021 12:30 PM Medical Record Number: [id number] Patient Account Number: [id number] Date of Birth/Sex: Treating RN: [birth year] (88 y.o. Gregory [name] Primary Care Provider: Wylene Simmer Other Clinician: Referring Provider: Treating Provider/Extender: Roxy Cedar in Treatment: 16 History of Present Illness HPI Description: ADMISSION 05/05/2021 This is an extremely frail 88 year old man who presents to  clinic today accompanied by his son for evaluation of Gregory pressure ulcer on his left heel. He also has dry gangrene on the tips of his bilateral great toes and the lateral aspect of both feet. It sounds like after Gregory hospitalization for Gregory GI bleed, he had Gregory blanket on his feet which ultimately resulted in Gregory pressure injury. He has severe dysphagia and is dependent on G-tube feeding. The wound on his heel has been present since January. He has 24-hour care and they have been applying Xeroform to the heel. He apparently has some sort of air boot that he is meant to wear in bed at night. He has severe peripheral arterial disease as evidenced by the ABIs obtained by Dr. Rachelle Hora and copied here: ABI Findings: +---------+------------------+-----+----------+--------+ Right Rt Pressure (mmHg)IndexWaveform Comment  +---------+------------------+-----+----------+--------+ Brachial 136     +---------+------------------+-----+----------+--------+ PTA >255 [place] monophasic  +---------+------------------+-----+----------+--------+ DP >255 [place] monophasic  +---------+------------------+-----+----------+--------+ Great T oe24 0.18    +---------+------------------+-----+----------+--------+ +---------+------------------+-----+----------+-------+ Left Lt Pressure (mmHg)IndexWaveform Comment +---------+------------------+-----+----------+-------+ Brachial 136     +---------+------------------+-----+----------+-------+ PTA >255 [place] monophasic  +---------+------------------+-----+----------+-------+ DP >255 [place] monophasic  +---------+------------------+-----+----------+-------+ Great T oe0 0.00 Absent   +---------+------------------+-----+----------+-------+ +-------+-----------+-----------+------------+------------+ ABI/TBIT oday's ABIT oday's TBIPrevious ABIPrevious TBI +-------+-----------+-----------+------------+------------+ Right [place] 0.18     +-------+-----------+-----------+------------+------------+ Left [place] 0.00    +-------+-----------+-----------+------------+------------+ Summary: Right: Resting right ankle-brachial index indicates noncompressible right lower extremity arteries. The right toe-brachial index is abnormal. Left: Resting left ankle-brachial index indicates noncompressible left lower extremity arteries. Absent left great toe PPG waveform. He saw Dr. Rachelle Hora on March 16 and at that time, he was felt to be far too frail to undergo any sort of intervention including an aortogram. Certainly not Gregory surgical candidate. According to  the electronic medical record, Dr. Rachelle Hora is planning to see him back in Gregory couple of weeks and Gregory determine if he has made any significant improvement in his overall status and might be suitable for an intervention. I read his note to suggest that the patient has had multiple complications from other procedures and that even an aortogram may be too risky. 05/12/2021: Rather remarkably, the eschar and dry gangrene on his bilateral great toes and bilateral fifth metatarsals actually looks better. Some of the eschar on his toes is peeling away to reveal healing tissue underneath. This is also the case with his lateral foot bilaterally. The heel is clean and superficial, with minimal slough. 06/07/2021: Since our last visit, Mr. Tuberville has been in the hospital quite Gregory bit. While in the hospital, they were painting his lateral feet and great toes with Betadine. The wound on his heel is nearly closed with just Gregory sliver of open skin. They have been floating his heels as directed and his home health nurse recently ordered Gregory device to keep his blankets from rubbing on his toes. They have continued using the Betadine on his lateral feet and great toes and the Prisma silver collagen on his left heel. He does have Gregory bit of callus buildup on his right Achilles tendon area suggesting there is  some pressure being applied. 06/15/2021: The left heel wound is closed. There is some slough buildup in the right lateral foot wound. Both great toe wounds have Gregory layer of thick dark eschar, but they appear to have some viable tissue underneath this. He is complaining of pain in his right heel; he had Gregory wound there at one time. On inspection, there is no open skin at this site. 06/23/2021: The left heel remains closed. The right heel never has opened. Both great toe wounds continue to have Gregory layer of thick dark eschar, but it is softer today. The right lateral foot wound also has eschar overlying the surface which has some accumulated slough. He also has areas on his great toes bilaterally that look like they have been rubbing on the slippers that he wears all day. 07/06/2021: The right heel has Gregory new callus on it. It peeled off as I was examining the site and there is Gregory small open wound in that area. The right great toe looks worse today; there are more darkened areas of eschar and the toe itself feels Gregory bit boggy. The right lateral foot is small with just Gregory small amount of slough. The left great toe is in reasonable condition with just Gregory layer of fairly soft eschar at the tip. According to the patient's family, he has been insisting upon blankets being wrapped over his feet at night and he does not cooperate with heel flotation. He also required Gregory 2 unit blood transfusion last week for fairly significant anemia. 07/13/2021: Both heels are closed. The right lateral foot wound is small, but about the same size as last week. There is some accumulated slough within the wound. The right great toe looks better today; it is no longer boggy and the Betadine paint has dried up the areas that were concerning to me. The left great toe looks to be responding nicely to the Iodosorb dressing. 07/21/2021: The heels remain closed. The right lateral foot wound is about the same size as last week, but he is complaining of  more pain in the area and there is some perimeter erythema. The right great toe is looking much  better today; the Betadine paint continues to keep the areas of concern dry. The left great toe is doing well with Gregory nice wound surface underneath the crusted-on Iodosorb. 07/28/2021: The right lateral foot wound remains basically unchanged with some overlying eschar, underneath which there is Gregory very small amount of slough. The right great toe is looking worse today. It is red and warm and swollen. There is what looks like Gregory blood blister underneath the eschar on the medial aspect of the toe. The distal tip, however, does not look too bad. The left great toe wound is smaller. 08/07/2021: The right lateral foot wound has closed in quite Gregory bit and is down to just Gregory pinhole. The right great toe continues to be red and inflamed. He did not tolerate Bactrim and so he has been taking levofloxacin. I do not see that is making Gregory tremendous difference in the overall appearance of the toe, but the wounds themselves look Gregory little bit better. The medial aspect has some eschar accumulation and the tip has some slough buildup. The left great toe has some slough accumulation, but overall looks better than the right. He has not been compliant with the recommendation to keep blankets from resting on the tops of his toes; he says his feet get cold at night. 08/31/2021: In the interim since his last visit, the patient was hospitalized with Gregory CHF exacerbation. In that time, the ulcer on his right heel has reopened; he has Gregory new ulcer on his left lateral foot at the level of the fifth metatarsal head, the ulcer on his right lateral foot at the fifth metatarsal head has enlarged and deepened. The tips of his great toes look Gregory little bit more robust, but the medial wound on the right great toe has accumulated Gregory lot of slough and is larger. He did undergo removal of his right great toenail shortly after we saw him last. He has  multiple pairs of Prevalon boots but does not wear them. Electronic Signature(s) Signed: 08/31/2021 1:54:49 PM By: Fredirick Maudlin MD FACS Entered By: Fredirick Maudlin on 08/31/2021 13:54:49 -------------------------------------------------------------------------------- Physical Exam Details Patient Name: Date of Service: Gregory [name], Gregory [name] LD 08/31/2021 12:30 PM Medical Record Number: [id number] Patient Account Number: [id number] Date of Birth/Sex: Treating RN: [birth year] (88 y.o. Gregory [name] Primary Care Provider: Wylene Simmer Other Clinician: Referring Provider: Treating Provider/Extender: Roxy Cedar in Treatment: 16 Constitutional . . . . No acute distress.Marland Kitchen Respiratory Normal work of breathing on room air.. Notes 08/31/2021: The ulcer on his right heel has reopened; he has Gregory new ulcer on his left lateral foot at the level of the fifth metatarsal head, the ulcer on his right lateral foot at the fifth metatarsal head has enlarged and deepened. The tips of his great toes look Gregory little bit more robust, but the medial wound on the right great toe has accumulated Gregory lot of slough and is larger. He did undergo removal of his right great toenail shortly after we saw him last. Electronic Signature(s) Signed: 08/31/2021 1:55:35 PM By: Fredirick Maudlin MD FACS Entered By: Fredirick Maudlin on 08/31/2021 13:55:35 -------------------------------------------------------------------------------- Physician Orders Details Patient Name: Date of Service: Gregory [name], Gregory [name] LD 08/31/2021 12:30 PM Medical Record Number: [id number] Patient Account Number: [id number] Date of Birth/Sex: Treating RN: [birth year] (88 y.o. Gregory [name] Primary Care Provider: Wylene Simmer Other Clinician: Referring Provider: Treating Provider/Extender: Roxy Cedar in Treatment: [phone] Verbal / Phone Orders: No Diagnosis  Coding ICD-10 Coding Code  Description I70.263 Atherosclerosis of native arteries of extremities with gangrene, bilateral legs I73.9 Peripheral vascular disease, unspecified E43 Unspecified severe protein-calorie malnutrition N18.31 Chronic kidney disease, stage 3a R13.10 Dysphagia, unspecified L97.512 Non-pressure chronic ulcer of other part of right foot with fat layer exposed L97.522 Non-pressure chronic ulcer of other part of left foot with fat layer exposed L89.612 Pressure ulcer of right heel, stage 2 Follow-up Appointments ppointment in 1 week. - Dr. Celine Ahr Room 3 Return Gregory Bathing/ Shower/ Hygiene May shower and wash wound with soap and water. Edema Control - Lymphedema / SCD / Other Elevate legs to the level of the heart or above for 30 minutes daily and/or when sitting, Gregory frequency of: - throughout the day Avoid standing for long periods of time. Non Wound Condition Bilateral Lower Extremities Protect area with: - Right and Left heels protect with Heel Cups Other Non Wound Condition Orders/Instructions: - Bilateral Heels- heel cups + gauze Home Health No change in wound care orders this week; continue Home Health for wound care. May utilize formulary equivalent dressing for wound treatment orders unless otherwise specified. Other Home Health Orders/Instructions: - [facility] Wound Treatment Wound #1 - T Great oe Wound Laterality: Right Cleanser: Soap and Water 1 x Per Day/30 Days Discharge Instructions: May shower and wash wound with dial antibacterial soap and water prior to dressing change. Cleanser: Wound Cleanser 1 x Per Day/30 Days Discharge Instructions: Cleanse the wound with wound cleanser or normal saline prior to applying Gregory clean dressing using gauze sponges, not tissue or cotton balls. Topical: Gentamicin 1 x Per Day/30 Days Discharge Instructions: As directed by physician Prim Dressing: KerraCel Ag Gelling Fiber Dressing, 2x2 in (silver alginate) 1 x Per Day/30 Days ary Discharge  Instructions: Apply silver alginate to wound bed as instructed Secondary Dressing: Woven Gauze Sponges 2x2 in ([place]) 1 x Per Day/30 Days Discharge Instructions: Apply over primary dressing as directed. Secured With: Child psychotherapist, Sterile 2x75 (in/in) (Home Health) 1 x Per Day/30 Days Discharge Instructions: Secure with stretch gauze as directed. Wound #2 - T Great oe Wound Laterality: Left Cleanser: Soap and Water 1 x Per Day/30 Days Discharge Instructions: May shower and wash wound with dial antibacterial soap and water prior to dressing change. Cleanser: Wound Cleanser 1 x Per Day/30 Days Discharge Instructions: Cleanse the wound with wound cleanser or normal saline prior to applying Gregory clean dressing using gauze sponges, not tissue or cotton balls. Topical: Gentamicin 1 x Per Day/30 Days Discharge Instructions: As directed by physician Prim Dressing: KerraCel Ag Gelling Fiber Dressing, 2x2 in (silver alginate) 1 x Per Day/30 Days ary Discharge Instructions: Apply silver alginate to wound bed as instructed Secondary Dressing: Woven Gauze Sponges 2x2 in ([place]) 1 x Per Day/30 Days Discharge Instructions: Apply over primary dressing as directed. Secured With: Child psychotherapist, Sterile 2x75 (in/in) (Home Health) 1 x Per Day/30 Days Discharge Instructions: Secure with stretch gauze as directed. Wound #4 - Foot Wound Laterality: Right, Lateral Cleanser: Soap and Water 1 x Per Day/30 Days Discharge Instructions: May shower and wash wound with dial antibacterial soap and water prior to dressing change. Cleanser: Wound Cleanser 1 x Per Day/30 Days Discharge Instructions: Cleanse the wound with wound cleanser or normal saline prior to applying Gregory clean dressing using gauze sponges, not tissue or cotton balls. Topical: Gentamicin 1 x Per Day/30 Days Discharge Instructions: As directed by physician Prim Dressing: KerraCel Ag Gelling Fiber Dressing,  2x2  in (silver alginate) 1 x Per Day/30 Days ary Discharge Instructions: Apply silver alginate to wound bed as instructed Secondary Dressing: ALLEVYN Gentle Border, 3x3 (in/in) 1 x Per Day/30 Days Discharge Instructions: Apply over primary dressing as directed. Secondary Dressing: Woven Gauze Sponges 2x2 in (Home Health) 1 x Per Day/30 Days Discharge Instructions: Apply over primary dressing as directed. Wound #5 - T Great oe Wound Laterality: Right, Medial Cleanser: Soap and Water 1 x Per Day/30 Days Discharge Instructions: May shower and wash wound with dial antibacterial soap and water prior to dressing change. Cleanser: Wound Cleanser 1 x Per Day/30 Days Discharge Instructions: Cleanse the wound with wound cleanser or normal saline prior to applying Gregory clean dressing using gauze sponges, not tissue or cotton balls. Topical: Gentamicin 1 x Per Day/30 Days Discharge Instructions: As directed by physician Prim Dressing: KerraCel Ag Gelling Fiber Dressing, 2x2 in (silver alginate) 1 x Per Day/30 Days ary Discharge Instructions: Apply silver alginate to wound bed as instructed Secondary Dressing: Woven Gauze Sponges 2x2 in ([facility]) 1 x Per Day/30 Days Discharge Instructions: Apply over primary dressing as directed. Secured With: Child psychotherapist, Sterile 2x75 (in/in) (Home Health) 1 x Per Day/30 Days Discharge Instructions: Secure with stretch gauze as directed. Wound #6 - Calcaneus Wound Laterality: Right Cleanser: Soap and Water 1 x Per Day/30 Days Discharge Instructions: May shower and wash wound with dial antibacterial soap and water prior to dressing change. Cleanser: Wound Cleanser 1 x Per Day/30 Days Discharge Instructions: Cleanse the wound with wound cleanser or normal saline prior to applying Gregory clean dressing using gauze sponges, not tissue or cotton balls. Topical: Gentamicin 1 x Per Day/30 Days Discharge Instructions: As directed by physician Prim  Dressing: KerraCel Ag Gelling Fiber Dressing, 2x2 in (silver alginate) 1 x Per Day/30 Days ary Discharge Instructions: Apply silver alginate to wound bed as instructed Secondary Dressing: ALLEVYN Gentle Border, 3x3 (in/in) 1 x Per Day/30 Days Discharge Instructions: Apply over primary dressing as directed. Secondary Dressing: ALLEVYN Heel 4 1/2in x 5 1/2in / 10.5cm x 13.5cm 1 x Per Day/30 Days Discharge Instructions: Apply over primary dressing as directed. Secondary Dressing: Woven Gauze Sponges 2x2 in (Home Health) 1 x Per Day/30 Days Discharge Instructions: Apply over primary dressing as directed. Wound #7 - Foot Wound Laterality: Left, Lateral Cleanser: Soap and Water 1 x Per Day/30 Days Discharge Instructions: May shower and wash wound with dial antibacterial soap and water prior to dressing change. Cleanser: Wound Cleanser 1 x Per Day/30 Days Discharge Instructions: Cleanse the wound with wound cleanser or normal saline prior to applying Gregory clean dressing using gauze sponges, not tissue or cotton balls. Topical: Gentamicin 1 x Per Day/30 Days Discharge Instructions: As directed by physician Prim Dressing: KerraCel Ag Gelling Fiber Dressing, 2x2 in (silver alginate) 1 x Per Day/30 Days ary Discharge Instructions: Apply silver alginate to wound bed as instructed Secondary Dressing: ALLEVYN Gentle Border, 4x4 (in/in) 1 x Per Day/30 Days Discharge Instructions: Apply over primary dressing as directed. Secondary Dressing: Woven Gauze Sponges 2x2 in (Home Health) 1 x Per Day/30 Days Discharge Instructions: Apply over primary dressing as directed. Electronic Signature(s) Signed: 08/31/2021 4:26:52 PM By: Fredirick Maudlin MD FACS Entered By: Fredirick Maudlin on 08/31/2021 13:55:51 -------------------------------------------------------------------------------- Problem List Details Patient Name: Date of Service: Gregory [name], Gregory [name] LD 08/31/2021 12:30 PM Medical Record Number:  [id number] Patient Account Number: [id number] Date of Birth/Sex: Treating RN: 05-Feb-1921 (88 y.o. 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, bilateral legs 05/05/2021 No Yes I73.9 Peripheral vascular disease, unspecified 05/05/2021 No Yes E43 Unspecified severe protein-calorie malnutrition 05/05/2021 No Yes N18.31 Chronic kidney disease, stage 3a 05/05/2021 No Yes R13.10 Dysphagia, unspecified 05/05/2021 No Yes L97.512 Non-pressure chronic ulcer of other part of right foot with fat layer exposed 06/07/2021 No Yes L97.522 Non-pressure chronic ulcer of other part of left foot with fat layer exposed 06/07/2021 No Yes L89.612 Pressure ulcer of right heel, stage 2 07/06/2021 No Yes Inactive Problems ICD-10 Code Description Active Date Inactive Date L89.622 Pressure ulcer of left heel, stage 2 05/05/2021 05/05/2021 Resolved Problems Electronic Signature(s) Signed: 08/31/2021 1:50:08 PM By: Fredirick Maudlin MD FACS Entered By: Fredirick Maudlin on 08/31/2021 13:50:08 -------------------------------------------------------------------------------- Progress Note Details Patient Name: Date of Service: Gregory [name], Gregory [name] LD 08/31/2021 12:30 PM Medical Record Number: [id number] Patient Account Number: [id number] Date of Birth/Sex: Treating RN: [birth year] (88 y.o. Gregory [name] Primary Care Provider: Wylene Simmer Other Clinician: Referring Provider: Treating Provider/Extender: Roxy Cedar in Treatment: 16 Subjective Chief Complaint Information obtained from Patient Patient is at the clinic for treatment of an open pressure ulcer on his left heel and dry gangrene of both great  toes History of Present Illness (HPI) ADMISSION 05/05/2021 This is an extremely frail 88 year old man who presents to clinic today accompanied by his son for evaluation of Gregory pressure ulcer on his left heel. He also has dry gangrene on the tips of his bilateral great toes and the lateral aspect of both feet. It sounds like after Gregory hospitalization for Gregory GI bleed, he had Gregory blanket on his feet which ultimately resulted in Gregory pressure injury. He has severe dysphagia and is dependent on G-tube feeding. The wound on his heel has been present since January. He has 24-hour care and they have been applying Xeroform to the heel. He apparently has some sort of air boot that he is meant to wear in bed at night. He has severe peripheral arterial disease as evidenced by the ABIs obtained by Dr. Rachelle Hora and copied here: ABI Findings: +---------+------------------+-----+----------+--------+ Right Rt Pressure (mmHg)IndexWaveform Comment  +---------+------------------+-----+----------+--------+ Brachial 136    +---------+------------------+-----+----------+--------+ PTA >255 [place] monophasic  +---------+------------------+-----+----------+--------+ DP >255 [place] monophasic  +---------+------------------+-----+----------+--------+ Great T oe24 0.18    +---------+------------------+-----+----------+--------+ +---------+------------------+-----+----------+-------+ Left Lt Pressure (mmHg)IndexWaveform Comment +---------+------------------+-----+----------+-------+ Brachial 136    +---------+------------------+-----+----------+-------+ PTA >255 [place] monophasic  +---------+------------------+-----+----------+-------+ DP >255 [place] monophasic  +---------+------------------+-----+----------+-------+ Great T oe0 0.00 Absent   +---------+------------------+-----+----------+-------+ +-------+-----------+-----------+------------+------------+ ABI/TBIT oday's ABIT  oday's TBIPrevious ABIPrevious TBI +-------+-----------+-----------+------------+------------+ Right Jack 0.18    +-------+-----------+-----------+------------+------------+ Left  0.00    +-------+-----------+-----------+------------+------------+ Summary: Right: Resting right ankle-brachial index indicates noncompressible right lower extremity arteries. The right toe-brachial index is abnormal. Left: Resting left ankle-brachial index indicates noncompressible left lower extremity arteries. Absent left great toe PPG waveform. He saw Dr. Rachelle Hora on March 16 and at that time, he was felt to be far too frail to undergo any sort of intervention including an aortogram. Certainly not Gregory surgical candidate. According to the electronic medical record, Dr. Rachelle Hora is planning to see him back in Gregory couple of weeks and Gregory determine if he has made any significant improvement in his overall status and might be suitable for an intervention. I read his note to suggest that the patient has had multiple complications from  other procedures and that even an aortogram may be too risky. 05/12/2021: Rather remarkably, the eschar and dry gangrene on his bilateral great toes and bilateral fifth metatarsals actually looks better. Some of the eschar on his toes is peeling away to reveal healing tissue underneath. This is also the case with his lateral foot bilaterally. The heel is clean and superficial, with minimal slough. 06/07/2021: Since our last visit, Mr. Golden has been in the hospital quite Gregory bit. While in the hospital, they were painting his lateral feet and great toes with Betadine. The wound on his heel is nearly closed with just Gregory sliver of open skin. They have been floating his heels as directed and his home health nurse recently ordered Gregory device to keep his blankets from rubbing on his toes. They have continued using the Betadine on his lateral feet and great toes and the Prisma silver  collagen on his left heel. He does have Gregory bit of callus buildup on his right Achilles tendon area suggesting there is some pressure being applied. 06/15/2021: The left heel wound is closed. There is some slough buildup in the right lateral foot wound. Both great toe wounds have Gregory layer of thick dark eschar, but they appear to have some viable tissue underneath this. He is complaining of pain in his right heel; he had Gregory wound there at one time. On inspection, there is no open skin at this site. 06/23/2021: The left heel remains closed. The right heel never has opened. Both great toe wounds continue to have Gregory layer of thick dark eschar, but it is softer today. The right lateral foot wound also has eschar overlying the surface which has some accumulated slough. He also has areas on his great toes bilaterally that look like they have been rubbing on the slippers that he wears all day. 07/06/2021: The right heel has Gregory new callus on it. It peeled off as I was examining the site and there is Gregory small open wound in that area. The right great toe looks worse today; there are more darkened areas of eschar and the toe itself feels Gregory bit boggy. The right lateral foot is small with just Gregory small amount of slough. The left great toe is in reasonable condition with just Gregory layer of fairly soft eschar at the tip. According to the patient's family, he has been insisting upon blankets being wrapped over his feet at night and he does not cooperate with heel flotation. He also required Gregory 2 unit blood transfusion last week for fairly significant anemia. 07/13/2021: Both heels are closed. The right lateral foot wound is small, but about the same size as last week. There is some accumulated slough within the wound. The right great toe looks better today; it is no longer boggy and the Betadine paint has dried up the areas that were concerning to me. The left great toe looks to be responding nicely to the Iodosorb dressing. 07/21/2021:  The heels remain closed. The right lateral foot wound is about the same size as last week, but he is complaining of more pain in the area and there is some perimeter erythema. The right great toe is looking much better today; the Betadine paint continues to keep the areas of concern dry. The left great toe is doing well with Gregory nice wound surface underneath the crusted-on Iodosorb. 07/28/2021: The right lateral foot wound remains basically unchanged with some overlying eschar, underneath which there is Gregory very small amount of slough.  The right great toe is looking worse today. It is red and warm and swollen. There is what looks like Gregory blood blister underneath the eschar on the medial aspect of the toe. The distal tip, however, does not look too bad. The left great toe wound is smaller. 08/07/2021: The right lateral foot wound has closed in quite Gregory bit and is down to just Gregory pinhole. The right great toe continues to be red and inflamed. He did not tolerate Bactrim and so he has been taking levofloxacin. I do not see that is making Gregory tremendous difference in the overall appearance of the toe, but the wounds themselves look Gregory little bit better. The medial aspect has some eschar accumulation and the tip has some slough buildup. The left great toe has some slough accumulation, but overall looks better than the right. He has not been compliant with the recommendation to keep blankets from resting on the tops of his toes; he says his feet get cold at night. 08/31/2021: In the interim since his last visit, the patient was hospitalized with Gregory CHF exacerbation. In that time, the ulcer on his right heel has reopened; he has Gregory new ulcer on his left lateral foot at the level of the fifth metatarsal head, the ulcer on his right lateral foot at the fifth metatarsal head has enlarged and deepened. The tips of his great toes look Gregory little bit more robust, but the medial wound on the right great toe has accumulated Gregory lot of  slough and is larger. He did undergo removal of his right great toenail shortly after we saw him last. He has multiple pairs of Prevalon boots but does not wear them. Patient History Information obtained from Patient, Caregiver. Family History Unknown History. Social History Never smoker, Marital Status - Widowed, Alcohol Use - Never, Drug Use - No History, Caffeine Use - Never. Medical History Cardiovascular Patient has history of Arrhythmia - Gregory-Fib, Hypertension Medical Gregory Surgical History Notes nd Cardiovascular Pacemaker, Complete heart block Gastrointestinal Dysphagia, PEG tube in place Endocrine Hypothyroidism Genitourinary CKD-3 Neurologic Neuromuscular disorder Objective Constitutional No acute distress.. Vitals Time Taken: 12:47 PM, Temperature: 97.6 F, Pulse: 81 bpm, Respiratory Rate: 18 breaths/min, Blood Pressure: 128/58 mmHg. Respiratory Normal work of breathing on room air.. General Notes: 08/31/2021: The ulcer on his right heel has reopened; he has Gregory new ulcer on his left lateral foot at the level of the fifth metatarsal head, the ulcer on his right lateral foot at the fifth metatarsal head has enlarged and deepened. The tips of his great toes look Gregory little bit more robust, but the medial wound on the right great toe has accumulated Gregory lot of slough and is larger. He did undergo removal of his right great toenail shortly after we saw him last. Integumentary (Hair, Skin) Wound #1 status is Open. Original cause of wound was Pressure Injury. The date acquired was: 02/12/2021. The wound has been in treatment 16 weeks. The wound is located on the Right T Great. The wound measures 0.8cm length x 1.3cm width x 0.1cm depth; 0.817cm^2 area and 0.082cm^3 volume. There is Fat oe Layer (Subcutaneous Tissue) exposed. There is no tunneling or undermining noted. There is Gregory medium amount of serosanguineous drainage noted. The wound margin is flat and intact. There is small (1-33%)  red granulation within the wound bed. There is Gregory large (67-100%) amount of necrotic tissue within the wound bed including Eschar and Adherent Slough. Wound #2 status is Open. Original cause  of wound was Pressure Injury. The date acquired was: 02/12/2021. The wound has been in treatment 16 weeks. The wound is located on the Left T Great. The wound measures 0.8cm length x 0.7cm width x 0.1cm depth; 0.44cm^2 area and 0.044cm^3 volume. There is Fat oe Layer (Subcutaneous Tissue) exposed. There is no tunneling or undermining noted. There is Gregory small amount of serosanguineous drainage noted. The wound margin is flat and intact. There is no granulation within the wound bed. There is Gregory large (67-100%) amount of necrotic tissue within the wound bed including Adherent Slough. Wound #4 status is Open. Original cause of wound was Gradually Appeared. The date acquired was: 04/12/2021. The wound has been in treatment 12 weeks. The wound is located on the Right,Lateral Foot. The wound measures 2.2cm length x 1.2cm width x 0.1cm depth; 2.073cm^2 area and 0.207cm^3 volume. There is Fat Layer (Subcutaneous Tissue) exposed. There is no tunneling or undermining noted. There is Gregory small amount of serosanguineous drainage noted. The wound margin is flat and intact. There is no granulation within the wound bed. There is Gregory large (67-100%) amount of necrotic tissue within the wound bed including Eschar and Adherent Slough. Wound #5 status is Open. Original cause of wound was Blister. The date acquired was: 06/12/2021. The wound has been in treatment 9 weeks. The wound is located on the Right,Medial T Great. The wound measures 1.9cm length x 2cm width x 0.1cm depth; 2.985cm^2 area and 0.298cm^3 volume. There is Fat oe Layer (Subcutaneous Tissue) exposed. There is no tunneling or undermining noted. There is Gregory medium amount of serosanguineous drainage noted. There is small (1-33%) red granulation within the wound bed. There is Gregory large  (67-100%) amount of necrotic tissue within the wound bed including Eschar and Adherent Slough. Wound #6 status is Open. Original cause of wound was Pressure Injury. The date acquired was: 06/24/2021. The wound has been in treatment 8 weeks. The wound is located on the Right Calcaneus. The wound measures 0.3cm length x 0.3cm width x 0.1cm depth; 0.071cm^2 area and 0.007cm^3 volume. There is Fat Layer (Subcutaneous Tissue) exposed. There is no tunneling or undermining noted. There is Gregory none present amount of drainage noted. There is no granulation within the wound bed. There is Gregory large (67-100%) amount of necrotic tissue within the wound bed including Eschar. Wound #7 status is Open. Original cause of wound was Gradually Appeared. The date acquired was: 08/31/2021. The wound is located on the Left,Lateral Foot. The wound measures 1.3cm length x 1.9cm width x 0.1cm depth; 1.94cm^2 area and 0.194cm^3 volume. There is Fat Layer (Subcutaneous Tissue) exposed. There is no tunneling or undermining noted. There is Gregory medium amount of serosanguineous drainage noted. There is no granulation within the wound bed. There is Gregory large (67-100%) amount of necrotic tissue within the wound bed including Eschar. Assessment Active Problems ICD-10 Atherosclerosis of native arteries of extremities with gangrene, bilateral legs Peripheral vascular disease, unspecified Unspecified severe protein-calorie malnutrition Chronic kidney disease, stage 3a Dysphagia, unspecified Non-pressure chronic ulcer of other part of right foot with fat layer exposed Non-pressure chronic ulcer of other part of left foot with fat layer exposed Pressure ulcer of right heel, stage 2 Procedures Wound #1 Pre-procedure diagnosis of Wound #1 is an Arterial Insufficiency Ulcer located on the Right T Great .Severity of Tissue Pre Debridement is: Fat layer oe exposed. There was Gregory Excisional Skin/Subcutaneous Tissue Debridement with Gregory total area of  1.04 sq cm performed by Fredirick Maudlin, MD. With  the following instrument(s): Curette to remove Non-Viable tissue/material. Material removed includes Subcutaneous Tissue and Slough and after achieving pain control using Lidocaine 4% T opical Solution. No specimens were taken. Gregory time out was conducted at 13:18, prior to the start of the procedure. Gregory Minimum amount of bleeding was controlled with Pressure. The procedure was tolerated well with Gregory pain level of 0 throughout and Gregory pain level of 0 following the procedure. Post Debridement Measurements: 0.8cm length x 1.3cm width x 0.1cm depth; 0.082cm^3 volume. Character of Wound/Ulcer Post Debridement is improved. Severity of Tissue Post Debridement is: Fat layer exposed. Post procedure Diagnosis Wound #1: Same as Pre-Procedure Wound #2 Pre-procedure diagnosis of Wound #2 is an Arterial Insufficiency Ulcer located on the Left T Great .Severity of Tissue Pre Debridement is: Fat layer oe exposed. There was Gregory Selective/Open Wound Non-Viable Tissue Debridement with Gregory total area of 0.56 sq cm performed by Fredirick Maudlin, MD. With the following instrument(s): Curette to remove Non-Viable tissue/material. Material removed includes Eschar and Slough and after achieving pain control using Lidocaine 4% T opical Solution. No specimens were taken. Gregory time out was conducted at 13:18, prior to the start of the procedure. Gregory Minimum amount of bleeding was controlled with Pressure. The procedure was tolerated well with Gregory pain level of 0 throughout and Gregory pain level of 0 following the procedure. Post Debridement Measurements: 0.8cm length x 0.7cm width x 0.1cm depth; 0.044cm^3 volume. Character of Wound/Ulcer Post Debridement is improved. Severity of Tissue Post Debridement is: Fat layer exposed. Post procedure Diagnosis Wound #2: Same as Pre-Procedure Wound #4 Pre-procedure diagnosis of Wound #4 is Gregory Pressure Ulcer located on the Right,Lateral Foot . There was Gregory  Selective/Open Wound Non-Viable Tissue Debridement with Gregory total area of 2.64 sq cm performed by Fredirick Maudlin, MD. With the following instrument(s): Curette to remove Non-Viable tissue/material. Material removed includes Eschar and Slough and after achieving pain control using Lidocaine 4% T opical Solution. No specimens were taken. Gregory time out was conducted at 13:18, prior to the start of the procedure. Gregory Minimum amount of bleeding was controlled with Pressure. The procedure was tolerated well with Gregory pain level of 0 throughout and Gregory pain level of 0 following the procedure. Post Debridement Measurements: 2.2cm length x 1.2cm width x 0.2cm depth; 0.415cm^3 volume. Post debridement Stage noted as Category/Stage II. Character of Wound/Ulcer Post Debridement is improved. Post procedure Diagnosis Wound #4: Same as Pre-Procedure Wound #5 Pre-procedure diagnosis of Wound #5 is Gregory Pressure Ulcer located on the Right,Medial T Great . There was Gregory Selective/Open Wound Non-Viable Tissue oe Debridement with Gregory total area of 3.8 sq cm performed by Fredirick Maudlin, MD. With the following instrument(s): Curette to remove Non-Viable tissue/material. Material removed includes Eschar and Slough and after achieving pain control using Lidocaine 4% T opical Solution. No specimens were taken. Gregory time out was conducted at 13:18, prior to the start of the procedure. Gregory Minimum amount of bleeding was controlled with Pressure. The procedure was tolerated well with Gregory pain level of 0 throughout and Gregory pain level of 0 following the procedure. Post Debridement Measurements: 1.9cm length x 2cm width x 0.1cm depth; 0.298cm^3 volume. Post debridement Stage noted as Category/Stage II. Character of Wound/Ulcer Post Debridement is improved. Post procedure Diagnosis Wound #5: Same as Pre-Procedure Wound #6 Pre-procedure diagnosis of Wound #6 is Gregory Pressure Ulcer located on the Right Calcaneus . There was Gregory Selective/Open Wound Non-Viable  Tissue Debridement with Gregory total area of 0.09  sq cm performed by Fredirick Maudlin, MD. With the following instrument(s): Curette to remove Non-Viable tissue/material. Material removed includes Eschar and Slough and after achieving pain control using Lidocaine 4% T opical Solution. No specimens were taken. Gregory time out was conducted at 13:18, prior to the start of the procedure. Gregory Minimum amount of bleeding was controlled with Pressure. The procedure was tolerated well with Gregory pain level of 0 throughout and Gregory pain level of 0 following the procedure. Post Debridement Measurements: 0.3cm length x 0.3cm width x 0.1cm depth; 0.007cm^3 volume. Post debridement Stage noted as Category/Stage II. Character of Wound/Ulcer Post Debridement is improved. Post procedure Diagnosis Wound #6: Same as Pre-Procedure Wound #7 Pre-procedure diagnosis of Wound #7 is Gregory Pressure Ulcer located on the Left,Lateral Foot . There was Gregory Excisional Skin/Subcutaneous Tissue Debridement with Gregory total area of 2.47 sq cm performed by Fredirick Maudlin, MD. With the following instrument(s): Curette to remove Non-Viable tissue/material. Material removed includes Eschar and Subcutaneous Tissue and after achieving pain control using Lidocaine 4% T opical Solution. No specimens were taken. Gregory time out was conducted at 13:18, prior to the start of the procedure. Gregory Minimum amount of bleeding was controlled with Pressure. The procedure was tolerated well with Gregory pain level of 0 throughout and Gregory pain level of 0 following the procedure. Post Debridement Measurements: 1.3cm length x 1.9cm width x 0.1cm depth; 0.194cm^3 volume. Post debridement Stage noted as Category/Stage II. Character of Wound/Ulcer Post Debridement is improved. Post procedure Diagnosis Wound #7: Same as Pre-Procedure Plan Follow-up Appointments: Return Appointment in 1 week. - Dr. Celine Ahr Room 3 Bathing/ Shower/ Hygiene: May shower and wash wound with soap and water. Edema  Control - Lymphedema / SCD / Other: Elevate legs to the level of the heart or above for 30 minutes daily and/or when sitting, Gregory frequency of: - throughout the day Avoid standing for long periods of time. Non Wound Condition: Protect area with: - Right and Left heels protect with Heel Cups Other Non Wound Condition Orders/Instructions: - Bilateral Heels- heel cups + gauze Home Health: No change in wound care orders this week; continue Home Health for wound care. May utilize formulary equivalent dressing for wound treatment orders unless otherwise specified. Other Home Health Orders/Instructions: - [facility] WOUND #1: - T Great Wound Laterality: Right oe Cleanser: Soap and Water 1 x Per Day/30 Days Discharge Instructions: May shower and wash wound with dial antibacterial soap and water prior to dressing change. Cleanser: Wound Cleanser 1 x Per Day/30 Days Discharge Instructions: Cleanse the wound with wound cleanser or normal saline prior to applying Gregory clean dressing using gauze sponges, not tissue or cotton balls. Topical: Gentamicin 1 x Per Day/30 Days Discharge Instructions: As directed by physician Prim Dressing: KerraCel Ag Gelling Fiber Dressing, 2x2 in (silver alginate) 1 x Per Day/30 Days ary Discharge Instructions: Apply silver alginate to wound bed as instructed Secondary Dressing: Woven Gauze Sponges 2x2 in ([place]) 1 x Per Day/30 Days Discharge Instructions: Apply over primary dressing as directed. Secured With: Child psychotherapist, Sterile 2x75 (in/in) (Home Health) 1 x Per Day/30 Days Discharge Instructions: Secure with stretch gauze as directed. WOUND #2: - T Great Wound Laterality: Left oe Cleanser: Soap and Water 1 x Per Day/30 Days Discharge Instructions: May shower and wash wound with dial antibacterial soap and water prior to dressing change. Cleanser: Wound Cleanser 1 x Per Day/30 Days Discharge Instructions: Cleanse the wound with wound cleanser or  normal saline prior to  applying Gregory clean dressing using gauze sponges, not tissue or cotton balls. Topical: Gentamicin 1 x Per Day/30 Days Discharge Instructions: As directed by physician Prim Dressing: KerraCel Ag Gelling Fiber Dressing, 2x2 in (silver alginate) 1 x Per Day/30 Days ary Discharge Instructions: Apply silver alginate to wound bed as instructed Secondary Dressing: Woven Gauze Sponges 2x2 in ([place]) 1 x Per Day/30 Days Discharge Instructions: Apply over primary dressing as directed. Secured With: Child psychotherapist, Sterile 2x75 (in/in) (Home Health) 1 x Per Day/30 Days Discharge Instructions: Secure with stretch gauze as directed. WOUND #4: - Foot Wound Laterality: Right, Lateral Cleanser: Soap and Water 1 x Per Day/30 Days Discharge Instructions: May shower and wash wound with dial antibacterial soap and water prior to dressing change. Cleanser: Wound Cleanser 1 x Per Day/30 Days Discharge Instructions: Cleanse the wound with wound cleanser or normal saline prior to applying Gregory clean dressing using gauze sponges, not tissue or cotton balls. Topical: Gentamicin 1 x Per Day/30 Days Discharge Instructions: As directed by physician Prim Dressing: KerraCel Ag Gelling Fiber Dressing, 2x2 in (silver alginate) 1 x Per Day/30 Days ary Discharge Instructions: Apply silver alginate to wound bed as instructed Secondary Dressing: ALLEVYN Gentle Border, 3x3 (in/in) 1 x Per Day/30 Days Discharge Instructions: Apply over primary dressing as directed. Secondary Dressing: Woven Gauze Sponges 2x2 in (Home Health) 1 x Per Day/30 Days Discharge Instructions: Apply over primary dressing as directed. WOUND #5: - T Great Wound Laterality: Right, Medial oe Cleanser: Soap and Water 1 x Per Day/30 Days Discharge Instructions: May shower and wash wound with dial antibacterial soap and water prior to dressing change. Cleanser: Wound Cleanser 1 x Per Day/30 Days Discharge  Instructions: Cleanse the wound with wound cleanser or normal saline prior to applying Gregory clean dressing using gauze sponges, not tissue or cotton balls. Topical: Gentamicin 1 x Per Day/30 Days Discharge Instructions: As directed by physician Prim Dressing: KerraCel Ag Gelling Fiber Dressing, 2x2 in (silver alginate) 1 x Per Day/30 Days ary Discharge Instructions: Apply silver alginate to wound bed as instructed Secondary Dressing: Woven Gauze Sponges 2x2 in ([place]) 1 x Per Day/30 Days Discharge Instructions: Apply over primary dressing as directed. Secured With: Child psychotherapist, Sterile 2x75 (in/in) (Home Health) 1 x Per Day/30 Days Discharge Instructions: Secure with stretch gauze as directed. WOUND #6: - Calcaneus Wound Laterality: Right Cleanser: Soap and Water 1 x Per Day/30 Days Discharge Instructions: May shower and wash wound with dial antibacterial soap and water prior to dressing change. Cleanser: Wound Cleanser 1 x Per Day/30 Days Discharge Instructions: Cleanse the wound with wound cleanser or normal saline prior to applying Gregory clean dressing using gauze sponges, not tissue or cotton balls. Topical: Gentamicin 1 x Per Day/30 Days Discharge Instructions: As directed by physician Prim Dressing: KerraCel Ag Gelling Fiber Dressing, 2x2 in (silver alginate) 1 x Per Day/30 Days ary Discharge Instructions: Apply silver alginate to wound bed as instructed Secondary Dressing: ALLEVYN Gentle Border, 3x3 (in/in) 1 x Per Day/30 Days Discharge Instructions: Apply over primary dressing as directed. Secondary Dressing: ALLEVYN Heel 4 1/2in x 5 1/2in / 10.5cm x 13.5cm 1 x Per Day/30 Days Discharge Instructions: Apply over primary dressing as directed. Secondary Dressing: Woven Gauze Sponges 2x2 in (Home Health) 1 x Per Day/30 Days Discharge Instructions: Apply over primary dressing as directed. WOUND #7: - Foot Wound Laterality: Left, Lateral Cleanser: Soap and Water 1 x  Per Day/30 Days Discharge Instructions: May shower and  wash wound with dial antibacterial soap and water prior to dressing change. Cleanser: Wound Cleanser 1 x Per Day/30 Days Discharge Instructions: Cleanse the wound with wound cleanser or normal saline prior to applying Gregory clean dressing using gauze sponges, not tissue or cotton balls. Topical: Gentamicin 1 x Per Day/30 Days Discharge Instructions: As directed by physician Prim Dressing: KerraCel Ag Gelling Fiber Dressing, 2x2 in (silver alginate) 1 x Per Day/30 Days ary Discharge Instructions: Apply silver alginate to wound bed as instructed Secondary Dressing: ALLEVYN Gentle Border, 4x4 (in/in) 1 x Per Day/30 Days Discharge Instructions: Apply over primary dressing as directed. Secondary Dressing: Woven Gauze Sponges 2x2 in (Home Health) 1 x Per Day/30 Days Discharge Instructions: Apply over primary dressing as directed. 08/31/2021: In the interim since his last visit, the patient was hospitalized with Gregory CHF exacerbation. In that time, the ulcer on his right heel has reopened; he has Gregory new ulcer on his left lateral foot at the level of the fifth metatarsal head, the ulcer on his right lateral foot at the fifth metatarsal head has enlarged and deepened. The tips of his great toes look Gregory little bit more robust, but the medial wound on the right great toe has accumulated Gregory lot of slough and is larger. He did undergo removal of his right great toenail shortly after we saw him last. He has multiple pairs of Prevalon boots but does not wear them. I used Gregory curette to debride slough and nonviable subcutaneous tissue from all of the wounds. I emphasized multiple times the importance of wearing his Prevalon boots and avoiding any pressure on his heels, his lateral feet, or his toes. I am not optimistic for good compliance. We Gregory continue using gentamicin and silver alginate to both his. Follow-up in 1 week. Electronic Signature(s) Signed: 08/31/2021  1:57:35 PM By: Fredirick Maudlin MD FACS Entered By: Fredirick Maudlin on 08/31/2021 13:57:35 -------------------------------------------------------------------------------- HxROS Details Patient Name: Date of Service: Gregory [name], Gregory [name] LD 08/31/2021 12:30 PM Medical Record Number: [id number] Patient Account Number: [id number] Date of Birth/Sex: Treating RN: March 12, 1920 (88 y.o. Gregory [name] Primary Care Provider: Wylene Simmer Other Clinician: Referring Provider: Treating Provider/Extender: Roxy Cedar in Treatment: 16 Information Obtained From Patient Caregiver Cardiovascular Medical History: Positive for: Arrhythmia - Gregory-Fib; Hypertension Past Medical History Notes: Pacemaker, Complete heart block Gastrointestinal Medical History: Past Medical History Notes: Dysphagia, PEG tube in place Endocrine Medical History: Past Medical History Notes: Hypothyroidism Genitourinary Medical History: Past Medical History Notes: CKD-3 Neurologic Medical History: Past Medical History Notes: Neuromuscular disorder Immunizations Pneumococcal Vaccine: Received Pneumococcal Vaccination: Yes Received Pneumococcal Vaccination On or After 60th Birthday: No Implantable Devices Yes Family and Social History Unknown History: Yes; Never smoker; Marital Status - Widowed; Alcohol Use: Never; Drug Use: No History; Caffeine Use: Never; Financial Concerns: No; Food, Clothing or Shelter Needs: No; Support System Lacking: No; Transportation Concerns: No Electronic Signature(s) Signed: 08/31/2021 4:26:52 PM By: Fredirick Maudlin MD FACS Signed: 09/04/2021 12:55:32 PM By: Dellie Catholic RN Entered By: Fredirick Maudlin on 08/31/2021 13:54:54 -------------------------------------------------------------------------------- SuperBill Details Patient Name: Date of Service: Gregory [name], Gregory [name] LD 08/31/2021 Medical Record Number: [id number] Patient Account Number:  [id number] Date of Birth/Sex: Treating RN: 03-14-1920 (88 y.o. Gregory [name] Primary Care Provider: Wylene Simmer Other Clinician: Referring Provider: Treating Provider/Extender: Roxy Cedar in Treatment: 16 Diagnosis Coding ICD-10 Codes Code Description [phone] Atherosclerosis of native arteries of extremities with gangrene, bilateral legs I73.9 Peripheral vascular disease, unspecified E43 Unspecified  severe protein-calorie malnutrition N18.31 Chronic kidney disease, stage 3a R13.10 Dysphagia, unspecified L97.512 Non-pressure chronic ulcer of other part of right foot with fat layer exposed L97.522 Non-pressure chronic ulcer of other part of left foot with fat layer exposed L89.612 Pressure ulcer of right heel, stage 2 Facility Procedures CPT4 Code: [id number] Description: 21828 - DEB SUBQ TISSUE 20 SQ CM/< ICD-10 Diagnosis Description L97.512 Non-pressure chronic ulcer of other part of right foot with fat layer exposed L97.522 Non-pressure chronic ulcer of other part of left foot with fat layer exposed Modifier: Quantity: 1 CPT4 Code: [id number] Description: 46047 - DEBRIDE WOUND 1ST 20 SQ CM OR < ICD-10 Diagnosis Description L89.612 Pressure ulcer of right heel, stage 2 Modifier: Quantity: 1 Physician Procedures : CPT4 Code Description Modifier [id number] - WC PHYS LEVEL 4 - EST PT 25 ICD-10 Diagnosis Description L97.512 Non-pressure chronic ulcer of other part of right foot with fat layer exposed L97.522 Non-pressure chronic ulcer of other part of left foot  with fat layer exposed L89.612 Pressure ulcer of right heel, stage 2 I70.263 Atherosclerosis of native arteries of extremities with gangrene, bilateral legs Quantity: 1 : [id number] 11042 - WC PHYS SUBQ TISS 20 SQ CM ICD-10 Diagnosis Description L97.512 Non-pressure chronic ulcer of other part of right foot with fat layer exposed L97.522 Non-pressure chronic ulcer of other part of left foot  with fat layer exposed Quantity: 1 : [id number] 00379 - WC PHYS DEBR WO ANESTH 20 SQ CM ICD-10 Diagnosis Description L89.612 Pressure ulcer of right heel, stage 2 Quantity: 1 Electronic Signature(s) Signed: 08/31/2021 1:58:03 PM By: Fredirick Maudlin MD FACS Entered By: Fredirick Maudlin on 08/31/2021 13:58:02

## 2021-09-04 NOTE — Progress Notes (Signed)
KEO, SCHIRMER ([id number]) Visit Report for 08/31/2021 Arrival Information Details Patient Name: Date of Service: Gregory [name] [facility] LD 08/31/2021 12:30 PM Medical Record Number: [id number] Patient Account Number: [id number] Date of Birth/Sex: Treating RN: 1920-07-04 (88 y.o. [name] Primary Care Deanda Ruddell: Gregory [name] Other Clinician: Referring Gregory [name]: Treating Hisao Doo/Extender: Roxy Cedar in Treatment: 18 Visit Information History Since Last Visit Added or deleted any medications: No Patient Arrived: Ambulatory Any new allergies or adverse reactions: No Arrival Time: 12:43 Had a fall or experienced change in No Accompanied By: self activities of daily living that may affect Transfer Assistance: None risk of falls: Patient Identification Verified: Yes Signs or symptoms of abuse/neglect since last visito No Secondary Verification Process Completed: Yes Hospitalized since last visit: Yes Patient Requires Transmission-Based Precautions: No Implantable device outside of the clinic excluding No Patient Has Alerts: Yes cellular tissue based products placed in the center Patient Alerts: R ABI: [place] TBI: 0.18 since last visit: L ABI: [place] TBI: 0.0 Has Dressing in Place as Prescribed: Yes Pain Present Now: No Electronic Signature(s) Signed: 08/31/2021 5:36:35 PM By: Sharyn Creamer RN, BSN Entered By: Sharyn Creamer on 08/31/2021 13:20:52 -------------------------------------------------------------------------------- Encounter Discharge Information Details Patient Name: Date of Service: Gregory [name], [name] 08/31/2021 12:30 PM Medical Record Number: [id number] Patient Account Number: [id number] Date of Birth/Sex: Treating RN: 11-Sep-1920 (88 y.o. [name] Primary Care Chanae Gemma: Gregory [name] Other Clinician: Referring Gregory [name]: Treating Gregory [name]/Extender: Roxy Cedar in Treatment: 16 Encounter Discharge  Information Items Post Procedure Vitals Discharge Condition: Stable Temperature (F): 97.6 Ambulatory Status: Wheelchair Pulse (bpm): 81 Discharge Destination: Home Respiratory Rate (breaths/min): 18 Transportation: Private Auto Blood Pressure (mmHg): 128/58 Accompanied By: daughter Schedule Follow-up Appointment: Yes Clinical Summary of Care: Patient Declined Electronic Signature(s) Signed: 08/31/2021 5:36:35 PM By: Sharyn Creamer RN, BSN Entered By: Sharyn Creamer on 08/31/2021 16:48:55 -------------------------------------------------------------------------------- Lower Extremity Assessment Details Patient Name: Date of Service: Gregory [name], [name] 08/31/2021 12:30 PM Medical Record Number: [id number] Patient Account Number: [id number] Date of Birth/Sex: Treating RN: [birth year] (88 y.o. [name] Primary Care Brandyn Thien: Gregory [name] Other Clinician: Referring Gregory [name]: Treating Michael Ventresca/Extender: Roxy Cedar in Treatment: 16 Edema Assessment Assessed: [Left: No] [Right: No] E[Left: dema] [Right: :] Calf Left: Right: Point of Measurement: From Medial Instep 30 cm 30.5 cm Ankle Left: Right: Point of Measurement: From Medial Instep 20 cm 20 cm Electronic Signature(s) Signed: 08/31/2021 5:36:35 PM By: Sharyn Creamer RN, BSN Entered By: Sharyn Creamer on 08/31/2021 13:22:23 -------------------------------------------------------------------------------- Multi Wound Chart Details Patient Name: Date of Service: Gregory [name], [name] 08/31/2021 12:30 PM Medical Record Number: [id number] Patient Account Number: [id number] Date of Birth/Sex: Treating RN: Jul 06, 1920 (88 y.o. Gregory [name] Primary Care Valentine Kuechle: Gregory [name] Other Clinician: Referring Travonte Byard: Treating Gregory [name]/Extender: Roxy Cedar in Treatment: 16 Vital Signs Height(in): Pulse(bpm): 38 Weight(lbs): Blood Pressure(mmHg):  128/58 Body Mass Index(BMI): Temperature(F): 97.6 Respiratory Rate(breaths/min): 18 Photos: Right T Great oe Left T Great oe Right, Lateral Foot Wound Location: Pressure Injury Pressure Injury Gradually Appeared Wounding Event: Arterial Insufficiency Ulcer Arterial Insufficiency Ulcer Pressure Ulcer Primary Etiology: Arrhythmia, Hypertension Arrhythmia, Hypertension Arrhythmia, Hypertension Comorbid History: 02/12/2021 02/12/2021 04/12/2021 Date Acquired: '16 16 12 '$ Weeks of Treatment: Open Open Open Wound Status: No No No Wound Recurrence: 0.8x1.3x0.1 0.8x0.7x0.1 2.2x1.2x0.1 Measurements L x W x D (cm) 0.817 0.44 2.073 A (cm) : rea 0.082 0.044 0.207 Volume (cm) : 82.70% 88.80% -4310.60% % Reduction in Area: 82.60%  88.80% -4040.00% % Reduction in Volume: Full Thickness Without Exposed Unclassifiable Category/Stage II Classification: Support Structures Medium Small Small Exudate A mount: Serosanguineous Serosanguineous Serosanguineous Exudate Type: red, brown red, brown red, brown Exudate Color: Flat and Intact Flat and Intact Flat and Intact Wound Margin: Small (1-33%) None Present (0%) None Present (0%) Granulation A mount: Red N/A N/A Granulation Quality: Large (67-100%) Large (67-100%) Large (67-100%) Necrotic A mount: Eschar, Adherent Slough Adherent [facility], Adherent Slough Necrotic Tissue: Fat Layer (Subcutaneous Tissue): Yes Fat Layer (Subcutaneous Tissue): Yes Fat Layer (Subcutaneous Tissue): Yes Exposed Structures: Fascia: No Fascia: No Fascia: No Tendon: No Tendon: No Tendon: No Muscle: No Muscle: No Muscle: No Joint: No Joint: No Joint: No Bone: No Bone: No Bone: No Large (67-100%) Large (67-100%) Small (1-33%) Epithelialization: Debridement - Excisional Debridement - Selective/Open Wound Debridement - Selective/Open Wound Debridement: Pre-procedure Verification/Time Out 13:18 13:18 13:18 Taken: Lidocaine 4% Topical Solution  Lidocaine 4% Topical Solution Lidocaine 4% Topical Solution Pain Control: Subcutaneous, Psychologist, prison and probation services, Psychologist, prison and probation services, [employer] Tissue Debrided: Skin/Subcutaneous Tissue Non-Viable Tissue Non-Viable Tissue Level: 1.04 0.56 2.64 Debridement A (sq cm): rea Curette Curette Curette Instrument: Minimum Minimum Minimum Bleeding: Pressure Pressure Pressure Hemostasis A chieved: 0 0 0 Procedural Pain: 0 0 0 Post Procedural Pain: Procedure was tolerated well Procedure was tolerated well Procedure was tolerated well Debridement Treatment Response: 0.8x1.3x0.1 0.8x0.7x0.1 2.2x1.2x0.2 Post Debridement Measurements L x W x D (cm) 0.082 0.044 0.415 Post Debridement Volume: (cm) N/A N/A Category/Stage II Post Debridement Stage: Debridement Debridement Debridement Procedures Performed: Wound Number: '5 6 7 '$ Photos: Right, Medial T Great oe Right Calcaneus Left, Lateral Foot Wound Location: Blister Pressure Injury Gradually Appeared Wounding Event: Pressure Ulcer Pressure Ulcer Pressure Ulcer Primary Etiology: Arrhythmia, Hypertension Arrhythmia, Hypertension Arrhythmia, Hypertension Comorbid History: 06/12/2021 06/24/2021 08/31/2021 Date Acquired: 9 8 0 Weeks of Treatment: Open Open Open Wound Status: No No No Wound Recurrence: 1.9x2x0.1 0.3x0.3x0.1 1.3x1.9x0.1 Measurements L x W x D (cm) 2.985 0.071 1.94 A (cm) : rea 0.298 0.007 0.194 Volume (cm) : -4104.20% -129.00% N/A % Reduction in A rea: -4157.10% -16.70% N/A % Reduction in Volume: Category/Stage II Category/Stage II Category/Stage II Classification: Medium None Present Medium Exudate A mount: Serosanguineous N/A Serosanguineous Exudate Type: red, brown N/A red, brown Exudate Color: N/A N/A N/A Wound Margin: Small (1-33%) None Present (0%) None Present (0%) Granulation A mount: Red N/A N/A Granulation Quality: Large (67-100%) Large (67-100%) Large (67-100%) Necrotic A mount: Eschar,  Adherent Slough Eschar Eschar Necrotic Tissue: Fat Layer (Subcutaneous Tissue): Yes Fat Layer (Subcutaneous Tissue): Yes Fat Layer (Subcutaneous Tissue): Yes Exposed Structures: Fascia: No Fascia: No Fascia: No Tendon: No Tendon: No Tendon: No Muscle: No Muscle: No Muscle: No Joint: No Joint: No Joint: No Bone: No Bone: No Bone: No None Large (67-100%) None Epithelialization: Debridement - Selective/Open Wound Debridement - Selective/Open Wound Debridement - Excisional Debridement: Pre-procedure Verification/Time Out 13:18 13:18 13:18 Taken: Lidocaine 4% Topical Solution Lidocaine 4% Topical Solution Lidocaine 4% Topical Solution Pain Control: Necrotic/Eschar, Psychologist, prison and probation services, Psychologist, prison and probation services, Subcutaneous Tissue Debrided: Non-Viable Tissue Non-Viable Tissue Skin/Subcutaneous Tissue Level: 3.8 0.09 2.47 Debridement A (sq cm): rea Curette Curette Curette Instrument: Minimum Minimum Minimum Bleeding: Pressure Pressure Pressure Hemostasis A chieved: 0 0 0 Procedural Pain: 0 0 0 Post Procedural Pain: Procedure was tolerated well Procedure was tolerated well Procedure was tolerated well Debridement Treatment Response: 1.9x2x0.1 0.3x0.3x0.1 1.3x1.9x0.1 Post Debridement Measurements L x W x D (cm) 0.298 0.007 0.194 Post Debridement Volume: (cm) Category/Stage II Category/Stage II Category/Stage II  Post Debridement Stage: Debridement Debridement Debridement Procedures Performed: Treatment Notes Electronic Signature(s) Signed: 08/31/2021 1:51:02 PM By: Fredirick Maudlin MD FACS Signed: 09/04/2021 12:55:32 PM By: Dellie Catholic RN Entered By: Fredirick Maudlin on 08/31/2021 13:51:02 -------------------------------------------------------------------------------- Multi-Disciplinary Care Plan Details Patient Name: Date of Service: Gregory [name], [name] 08/31/2021 12:30 PM Medical Record Number: [id number] Patient Account Number: [id number] Date of  Birth/Sex: Treating RN: 20-Apr-1920 (88 y.o. [name] Primary Care Lenoard Helbert: Gregory [name] Other Clinician: Referring Jodey Burbano: Treating Ashaad Gaertner/Extender: Roxy Cedar in Treatment: 16 Multidisciplinary Care Plan reviewed with physician Active Inactive Abuse / Safety / Falls / Self Care Management Nursing Diagnoses: Potential for falls Potential for injury related to falls Goals: Patient Gregory not experience any injury related to falls Date Initiated: 05/05/2021 Target Resolution Date: 09/08/2021 Goal Status: Active Patient/caregiver Gregory verbalize/demonstrate measures taken to prevent injury and/or falls Date Initiated: 05/05/2021 Target Resolution Date: 09/08/2021 Goal Status: Active Interventions: Assess Activities of Daily Living upon admission and as needed Assess fall risk on admission and as needed Assess: immobility, friction, shearing, incontinence upon admission and as needed Assess impairment of mobility on admission and as needed per policy Assess personal safety and home safety (as indicated) on admission and as needed Assess self care needs on admission and as needed Provide education on fall prevention Provide education on personal and home safety Notes: Nutrition Nursing Diagnoses: Potential for alteratiion in Nutrition/Potential for imbalanced nutrition Goals: Patient/caregiver agrees to and verbalizes understanding of need to use nutritional supplements and/or vitamins as prescribed Date Initiated: 05/05/2021 Target Resolution Date: 09/08/2021 Goal Status: Active Interventions: Assess HgA1c results as ordered upon admission and as needed Assess patient nutrition upon admission and as needed per policy Provide education on elevated blood sugars and impact on wound healing Provide education on nutrition Treatment Activities: Education provided on Nutrition : 05/05/2021 Notes: Wound/Skin Impairment Nursing  Diagnoses: Impaired tissue integrity Knowledge deficit related to ulceration/compromised skin integrity Goals: Patient/caregiver Gregory verbalize understanding of skin care regimen Date Initiated: 05/05/2021 Target Resolution Date: 09/08/2021 Goal Status: Active Interventions: Assess patient/caregiver ability to obtain necessary supplies Assess patient/caregiver ability to perform ulcer/skin care regimen upon admission and as needed Assess ulceration(s) every visit Provide education on ulcer and skin care Notes: Electronic Signature(s) Signed: 08/31/2021 5:36:35 PM By: Sharyn Creamer RN, BSN Entered By: Sharyn Creamer on 08/31/2021 16:37:54 -------------------------------------------------------------------------------- Pain Assessment Details Patient Name: Date of Service: Gregory [name], [name] 08/31/2021 12:30 PM Medical Record Number: [id number] Patient Account Number: [id number] Date of Birth/Sex: Treating RN: 01/24/1921 (88 y.o. [name] Primary Care Ayahna Solazzo: Gregory [name] Other Clinician: Referring Rayna Brenner: Treating Tikisha Molinaro/Extender: Roxy Cedar in Treatment: 16 Active Problems Location of Pain Severity and Description of Pain Patient Has Paino No Site Locations Pain Management and Medication Current Pain Management: Electronic Signature(s) Signed: 08/31/2021 5:36:35 PM By: Sharyn Creamer RN, BSN Entered By: Sharyn Creamer on 08/31/2021 13:16:43 -------------------------------------------------------------------------------- Patient/Caregiver Education Details Patient Name: Date of Service: Gregory [name] RNO LD 7/20/2023andnbsp12:30 PM Medical Record Number: [id number] Patient Account Number: [id number] Date of Birth/Gender: Treating RN: March 03, 1920 (88 y.o. [name] Primary Care Physician: Gregory [name] Other Clinician: Referring Physician: Treating Physician/Extender: Roxy Cedar in  Treatment: 16 Education Assessment Education Provided To: Patient and Caregiver Education Topics Provided Wound/Skin Impairment: Methods: Explain/Verbal Responses: State content correctly [employer]) Signed: 08/31/2021 5:36:35 PM By: Sharyn Creamer RN, BSN Entered By: Sharyn Creamer on 08/31/2021 16:46:50 -------------------------------------------------------------------------------- Wound Assessment Details Patient Name: Date of  Service: Gregory [name] RNO LD 08/31/2021 12:30 PM Medical Record Number: [id number] Patient Account Number: [id number] Date of Birth/Sex: Treating RN: 1920/05/11 (88 y.o. [name] Primary Care Saga Balthazar: Gregory [name] Other Clinician: Referring Kemesha Mosey: Treating Darshana Curnutt/Extender: Roxy Cedar in Treatment: 16 Wound Status Wound Number: 1 Primary Etiology: Arterial Insufficiency Ulcer Wound Location: Right T Great oe Wound Status: Open Wounding Event: Pressure Injury Comorbid History: Arrhythmia, Hypertension Date Acquired: 02/12/2021 Weeks Of Treatment: 16 Clustered Wound: No Photos Wound Measurements Length: (cm) 0.8 Width: (cm) 1.3 Depth: (cm) 0.1 Area: (cm) 0.817 Volume: (cm) 0.082 % Reduction in Area: 82.7% % Reduction in Volume: 82.6% Epithelialization: Large (67-100%) Tunneling: No Undermining: No Wound Description Classification: Full Thickness Without Exposed Support Structures Wound Margin: Flat and Intact Exudate Amount: Medium Exudate Type: Serosanguineous Exudate Color: red, brown Foul Odor After Cleansing: No Slough/Fibrino Yes Wound Bed Granulation Amount: Small (1-33%) Exposed Structure Granulation Quality: Red Fascia Exposed: No Necrotic Amount: Large (67-100%) Fat Layer (Subcutaneous Tissue) Exposed: Yes Necrotic Quality: Eschar, Adherent Slough Tendon Exposed: No Muscle Exposed: No Joint Exposed: No Bone Exposed: No Treatment Notes Wound #1 (Toe Great) Wound  Laterality: Right Cleanser Soap and Water Discharge Instruction: May shower and wash wound with dial antibacterial soap and water prior to dressing change. Wound Cleanser Discharge Instruction: Cleanse the wound with wound cleanser or normal saline prior to applying a clean dressing using gauze sponges, not tissue or cotton balls. Peri-Wound Care Topical Gentamicin Discharge Instruction: As directed by physician Primary Dressing KerraCel Ag Gelling Fiber Dressing, 2x2 in (silver alginate) Discharge Instruction: Apply silver alginate to wound bed as instructed Secondary Dressing Woven Gauze Sponges 2x2 in Discharge Instruction: Apply over primary dressing as directed. Secured With Conforming Stretch Gauze Bandage, Sterile 2x75 (in/in) Discharge Instruction: Secure with stretch gauze as directed. Compression Wrap Compression Stockings Add-Ons Electronic Signature(s) Signed: 08/31/2021 5:36:35 PM By: Sharyn Creamer RN, BSN Entered By: Sharyn Creamer on 08/31/2021 13:01:17 -------------------------------------------------------------------------------- Wound Assessment Details Patient Name: Date of Service: Gregory [name], [name] 08/31/2021 12:30 PM Medical Record Number: [id number] Patient Account Number: [id number] Date of Birth/Sex: Treating RN: 25-May-1920 (88 y.o. [name] Primary Care Mailee Klaas: Gregory [name] Other Clinician: Referring Dennette Faulconer: Treating Wilder Kurowski/Extender: Roxy Cedar in Treatment: 16 Wound Status Wound Number: 2 Primary Etiology: Arterial Insufficiency Ulcer Wound Location: Left T Great oe Wound Status: Open Wounding Event: Pressure Injury Comorbid History: Arrhythmia, Hypertension Date Acquired: 02/12/2021 Weeks Of Treatment: 16 Clustered Wound: No Photos Wound Measurements Length: (cm) 0.8 Width: (cm) 0.7 Depth: (cm) 0.1 Area: (cm) 0.44 Volume: (cm) 0.044 % Reduction in Area: 88.8% % Reduction in Volume:  88.8% Epithelialization: Large (67-100%) Tunneling: No Undermining: No Wound Description Classification: Unclassifiable Wound Margin: Flat and Intact Exudate Amount: Small Exudate Type: Serosanguineous Exudate Color: red, brown Foul Odor After Cleansing: No Slough/Fibrino Yes Wound Bed Granulation Amount: None Present (0%) Exposed Structure Necrotic Amount: Large (67-100%) Fascia Exposed: No Necrotic Quality: Adherent Slough Fat Layer (Subcutaneous Tissue) Exposed: Yes Tendon Exposed: No Muscle Exposed: No Joint Exposed: No Bone Exposed: No Treatment Notes Wound #2 (Toe Great) Wound Laterality: Left Cleanser Soap and Water Discharge Instruction: May shower and wash wound with dial antibacterial soap and water prior to dressing change. Wound Cleanser Discharge Instruction: Cleanse the wound with wound cleanser or normal saline prior to applying a clean dressing using gauze sponges, not tissue or cotton balls. Peri-Wound Care Topical Gentamicin Discharge Instruction: As directed by physician Primary Dressing KerraCel Ag Gelling Fiber  Dressing, 2x2 in (silver alginate) Discharge Instruction: Apply silver alginate to wound bed as instructed Secondary Dressing Woven Gauze Sponges 2x2 in Discharge Instruction: Apply over primary dressing as directed. Secured With Conforming Stretch Gauze Bandage, Sterile 2x75 (in/in) Discharge Instruction: Secure with stretch gauze as directed. Compression Wrap Compression Stockings Add-Ons Electronic Signature(s) Signed: 08/31/2021 5:36:35 PM By: Sharyn Creamer RN, BSN Entered By: Sharyn Creamer on 08/31/2021 13:02:12 -------------------------------------------------------------------------------- Wound Assessment Details Patient Name: Date of Service: Gregory [name], [name] 08/31/2021 12:30 PM Medical Record Number: [id number] Patient Account Number: [id number] Date of Birth/Sex: Treating RN: May 20, 1920 (88 y.o. [name] Primary  Care Kimerly Rowand: Gregory [name] Other Clinician: Referring Alecxis Baltzell: Treating Gennell How/Extender: Roxy Cedar in Treatment: 16 Wound Status Wound Number: 4 Primary Etiology: Pressure Ulcer Wound Location: Right, Lateral Foot Wound Status: Open Wounding Event: Gradually Appeared Comorbid History: Arrhythmia, Hypertension Date Acquired: 04/12/2021 Weeks Of Treatment: 12 Clustered Wound: No Photos Wound Measurements Length: (cm) 2.2 Width: (cm) 1.2 Depth: (cm) 0.1 Area: (cm) 2.073 Volume: (cm) 0.207 % Reduction in Area: -4310.6% % Reduction in Volume: -4040% Epithelialization: Small (1-33%) Tunneling: No Undermining: No Wound Description Classification: Category/Stage II Wound Margin: Flat and Intact Exudate Amount: Small Exudate Type: Serosanguineous Exudate Color: red, brown Foul Odor After Cleansing: No Slough/Fibrino Yes Wound Bed Granulation Amount: None Present (0%) Exposed Structure Necrotic Amount: Large (67-100%) Fascia Exposed: No Necrotic Quality: Eschar, Adherent Slough Fat Layer (Subcutaneous Tissue) Exposed: Yes Tendon Exposed: No Muscle Exposed: No Joint Exposed: No Bone Exposed: No Treatment Notes Wound #4 (Foot) Wound Laterality: Right, Lateral Cleanser Soap and Water Discharge Instruction: May shower and wash wound with dial antibacterial soap and water prior to dressing change. Wound Cleanser Discharge Instruction: Cleanse the wound with wound cleanser or normal saline prior to applying a clean dressing using gauze sponges, not tissue or cotton balls. Peri-Wound Care Topical Gentamicin Discharge Instruction: As directed by physician Primary Dressing KerraCel Ag Gelling Fiber Dressing, 2x2 in (silver alginate) Discharge Instruction: Apply silver alginate to wound bed as instructed Secondary Dressing ALLEVYN Gentle Border, 3x3 (in/in) Discharge Instruction: Apply over primary dressing as directed. Woven Gauze  Sponges 2x2 in Discharge Instruction: Apply over primary dressing as directed. Secured With Compression Wrap Compression Stockings Environmental education officer) Signed: 08/31/2021 5:36:35 PM By: Sharyn Creamer RN, BSN Entered By: Sharyn Creamer on 08/31/2021 13:05:22 -------------------------------------------------------------------------------- Wound Assessment Details Patient Name: Date of Service: Gregory [name], [name] 08/31/2021 12:30 PM Medical Record Number: [id number] Patient Account Number: [id number] Date of Birth/Sex: Treating RN: Mar 10, 1920 (88 y.o. [name] Primary Care Taiwo Fish: Gregory [name] Other Clinician: Referring Nimra Puccinelli: Treating Anara Cowman/Extender: Roxy Cedar in Treatment: 16 Wound Status Wound Number: 5 Primary Etiology: Pressure Ulcer Wound Location: Right, Medial T Great oe Wound Status: Open Wounding Event: Blister Comorbid History: Arrhythmia, Hypertension Date Acquired: 06/12/2021 Weeks Of Treatment: 9 Clustered Wound: No Photos Wound Measurements Length: (cm) 1.9 Width: (cm) 2 Depth: (cm) 0.1 Area: (cm) 2.985 Volume: (cm) 0.298 % Reduction in Area: -4104.2% % Reduction in Volume: -4157.1% Epithelialization: None Tunneling: No Undermining: No Wound Description Classification: Category/Stage II Exudate Amount: Medium Exudate Type: Serosanguineous Exudate Color: red, brown Foul Odor After Cleansing: No Slough/Fibrino Yes Wound Bed Granulation Amount: Small (1-33%) Exposed Structure Granulation Quality: Red Fascia Exposed: No Necrotic Amount: Large (67-100%) Fat Layer (Subcutaneous Tissue) Exposed: Yes Necrotic Quality: Eschar, Adherent Slough Tendon Exposed: No Muscle Exposed: No Joint Exposed: No Bone Exposed: No Treatment Notes Wound #5 (Toe Great) Wound Laterality:  Right, Medial Cleanser Soap and Water Discharge Instruction: May shower and wash wound with dial antibacterial soap and  water prior to dressing change. Wound Cleanser Discharge Instruction: Cleanse the wound with wound cleanser or normal saline prior to applying a clean dressing using gauze sponges, not tissue or cotton balls. Peri-Wound Care Topical Gentamicin Discharge Instruction: As directed by physician Primary Dressing KerraCel Ag Gelling Fiber Dressing, 2x2 in (silver alginate) Discharge Instruction: Apply silver alginate to wound bed as instructed Secondary Dressing Woven Gauze Sponges 2x2 in Discharge Instruction: Apply over primary dressing as directed. Secured With Conforming Stretch Gauze Bandage, Sterile 2x75 (in/in) Discharge Instruction: Secure with stretch gauze as directed. Compression Wrap Compression Stockings Add-Ons Electronic Signature(s) Signed: 08/31/2021 5:36:35 PM By: Sharyn Creamer RN, BSN Entered By: Sharyn Creamer on 08/31/2021 13:06:10 -------------------------------------------------------------------------------- Wound Assessment Details Patient Name: Date of Service: Gregory [name], [name] 08/31/2021 12:30 PM Medical Record Number: [id number] Patient Account Number: [id number] Date of Birth/Sex: Treating RN: 05-06-1920 (88 y.o. [name] Primary Care Adalea Handler: Gregory [name] Other Clinician: Referring Angelo Prindle: Treating Suhan Paci/Extender: Roxy Cedar in Treatment: 16 Wound Status Wound Number: 6 Primary Etiology: Pressure Ulcer Wound Location: Right Calcaneus Wound Status: Open Wounding Event: Pressure Injury Comorbid History: Arrhythmia, Hypertension Date Acquired: 06/24/2021 Weeks Of Treatment: 8 Clustered Wound: No Photos Wound Measurements Length: (cm) 0.3 Width: (cm) 0.3 Depth: (cm) 0.1 Area: (cm) 0.071 Volume: (cm) 0.007 % Reduction in Area: -129% % Reduction in Volume: -16.7% Epithelialization: Large (67-100%) Tunneling: No Undermining: No Wound Description Classification: Category/Stage II Exudate  Amount: None Present Foul Odor After Cleansing: No Slough/Fibrino Yes Wound Bed Granulation Amount: None Present (0%) Exposed Structure Necrotic Amount: Large (67-100%) Fascia Exposed: No Necrotic Quality: Eschar Fat Layer (Subcutaneous Tissue) Exposed: Yes Tendon Exposed: No Muscle Exposed: No Joint Exposed: No Bone Exposed: No Treatment Notes Wound #6 (Calcaneus) Wound Laterality: Right Cleanser Soap and Water Discharge Instruction: May shower and wash wound with dial antibacterial soap and water prior to dressing change. Wound Cleanser Discharge Instruction: Cleanse the wound with wound cleanser or normal saline prior to applying a clean dressing using gauze sponges, not tissue or cotton balls. Peri-Wound Care Topical Gentamicin Discharge Instruction: As directed by physician Primary Dressing KerraCel Ag Gelling Fiber Dressing, 2x2 in (silver alginate) Discharge Instruction: Apply silver alginate to wound bed as instructed Secondary Dressing ALLEVYN Gentle Border, 3x3 (in/in) Discharge Instruction: Apply over primary dressing as directed. ALLEVYN Heel 4 1/2in x 5 1/2in / 10.5cm x 13.5cm Discharge Instruction: Apply over primary dressing as directed. Woven Gauze Sponges 2x2 in Discharge Instruction: Apply over primary dressing as directed. Secured With Compression Wrap Compression Stockings Environmental education officer) Signed: 08/31/2021 5:36:35 PM By: Sharyn Creamer RN, BSN Entered By: Sharyn Creamer on 08/31/2021 13:04:34 -------------------------------------------------------------------------------- Wound Assessment Details Patient Name: Date of Service: Gregory [name], [name] 08/31/2021 12:30 PM Medical Record Number: [id number] Patient Account Number: [id number] Date of Birth/Sex: Treating RN: 11-Jun-1920 (88 y.o. [name] Primary Care Lowella Kindley: Gregory [name] Other Clinician: Referring Wadie Liew: Treating Jadore Veals/Extender: Roxy Cedar in Treatment: 16 Wound Status Wound Number: 7 Primary Etiology: Pressure Ulcer Wound Location: Left, Lateral Foot Wound Status: Open Wounding Event: Gradually Appeared Comorbid History: Arrhythmia, Hypertension Date Acquired: 08/31/2021 Weeks Of Treatment: 0 Clustered Wound: No Photos Wound Measurements Length: (cm) 1.3 Width: (cm) 1.9 Depth: (cm) 0.1 Area: (cm) 1.94 Volume: (cm) 0.194 % Reduction in Area: % Reduction in Volume: Epithelialization: None Tunneling: No Undermining: No Wound Description Classification:  Category/Stage II Exudate Amount: Medium Exudate Type: Serosanguineous Exudate Color: red, brown Foul Odor After Cleansing: No Slough/Fibrino Yes Wound Bed Granulation Amount: None Present (0%) Exposed Structure Necrotic Amount: Large (67-100%) Fascia Exposed: No Necrotic Quality: Eschar Fat Layer (Subcutaneous Tissue) Exposed: Yes Tendon Exposed: No Muscle Exposed: No Joint Exposed: No Bone Exposed: No Treatment Notes Wound #7 (Foot) Wound Laterality: Left, Lateral Cleanser Soap and Water Discharge Instruction: May shower and wash wound with dial antibacterial soap and water prior to dressing change. Wound Cleanser Discharge Instruction: Cleanse the wound with wound cleanser or normal saline prior to applying a clean dressing using gauze sponges, not tissue or cotton balls. Peri-Wound Care Topical Gentamicin Discharge Instruction: As directed by physician Primary Dressing KerraCel Ag Gelling Fiber Dressing, 2x2 in (silver alginate) Discharge Instruction: Apply silver alginate to wound bed as instructed Secondary Dressing ALLEVYN Gentle Border, 4x4 (in/in) Discharge Instruction: Apply over primary dressing as directed. Woven Gauze Sponges 2x2 in Discharge Instruction: Apply over primary dressing as directed. Secured With Compression Wrap Compression Stockings Environmental education officer) Signed: 08/31/2021 5:36:35 PM  By: Sharyn Creamer RN, BSN Entered By: Sharyn Creamer on 08/31/2021 13:07:54 -------------------------------------------------------------------------------- [place] Details Patient Name: Date of Service: Gregory [name], [name] 08/31/2021 12:30 PM Medical Record Number: [id number] Patient Account Number: [id number] Date of Birth/Sex: Treating RN: 07/30/1920 (88 y.o. [name] Primary Care Trentan Trippe: Gregory [name] Other Clinician: Referring Adwoa Axe: Treating Taige Housman/Extender: Roxy Cedar in Treatment: 16 Vital Signs Time Taken: 12:47 Temperature (F): 97.6 Pulse (bpm): 81 Respiratory Rate (breaths/min): 18 Blood Pressure (mmHg): 128/58 Reference Range: 80 - 120 mg / dl Electronic Signature(s) Signed: 08/31/2021 5:36:35 PM By: Sharyn Creamer RN, BSN Entered By: Sharyn Creamer on 08/31/2021 12:47:44

## 2021-09-06 ENCOUNTER — Encounter: Payer: Self-pay | Admitting: General Practice

## 2021-09-08 ENCOUNTER — Encounter (HOSPITAL_BASED_OUTPATIENT_CLINIC_OR_DEPARTMENT_OTHER): Payer: Medicare Other | Admitting: Internal Medicine

## 2021-09-08 DIAGNOSIS — L89612 Pressure ulcer of right heel, stage 2: Secondary | ICD-10-CM | POA: Diagnosis not present

## 2021-09-08 NOTE — Progress Notes (Signed)
Gregory [name], Gregory [name] ([id number]) Visit Report for 09/08/2021 Arrival Information Details Patient Name: Date of Service: Gregory [name] [facility] LD 09/08/2021 12:30 PM Medical Record Number: [id number] Patient Account Number: [id number] Date of Birth/Sex: Treating RN: [birth year] (88 y.o. Gregory [name] Primary Care Gregory [name]: Gregory [name] Other Clinician: Referring Gregory [name]: Treating Gregory [name]/Extender: Gregory [name] in Treatment: 18 Visit Information History Since Last Visit Added or deleted any medications: No Patient Arrived: Wheel Chair Any new allergies or adverse reactions: No Arrival Time: 12:41 Had Gregory [name] fall or experienced change in No Accompanied By: daughter and friend activities of daily living that may affect Transfer Assistance: Manual risk of falls: Patient Identification Verified: Yes Signs or symptoms of abuse/neglect since last visito No Patient Requires Transmission-Based Precautions: No Hospitalized since last visit: No Patient Has Alerts: Yes Implantable device outside of the clinic excluding No Patient Alerts: R ABI: Eleva TBI: 0.18 cellular tissue based products placed in the center L ABI: [place] TBI: 0.0 since last visit: Has Dressing in Place as Prescribed: Yes Pain Present Now: No Electronic Signature(s) Signed: 09/08/2021 5:58:04 PM By: Gregory Catholic RN Entered By: Gregory [name] on 09/08/2021 12:42:23 -------------------------------------------------------------------------------- Encounter Discharge Information Details Patient Name: Date of Service: Gregory [name], Gregory [name] Gregory [name] LD 09/08/2021 12:30 PM Medical Record Number: [id number] Patient Account Number: [id number] Date of Birth/Sex: Treating RN: 1920/08/08 (88 y.o. Gregory [name] Primary Care Dezmond Downie: Gregory [name] Other Clinician: Referring Gregory [name]: Treating Gregory [name]/Extender: Gregory [name] in Treatment: 18 Encounter Discharge Information Items Post  Procedure Vitals Discharge Condition: Stable Temperature (F): 97.7 Ambulatory Status: Wheelchair Pulse (bpm): 75 Discharge Destination: Home Respiratory Rate (breaths/min): 18 Transportation: Private Auto Blood Pressure (mmHg): 136/71 Accompanied By: daughter Schedule Follow-up Appointment: Yes Clinical Summary of Care: Patient Declined Electronic Signature(s) Signed: 09/08/2021 5:58:04 PM By: Gregory Catholic RN Entered By: Gregory [name] on 09/08/2021 17:57:26 -------------------------------------------------------------------------------- Lower Extremity Assessment Details Patient Name: Date of Service: Gregory [name] Gregory [name] LD 09/08/2021 12:30 PM Medical Record Number: [id number] Patient Account Number: [id number] Date of Birth/Sex: Treating RN: May 31, 1920 (88 y.o. Gregory [name] Primary Care Jowel Waltner: Gregory [name] Other Clinician: Referring Tameshia Bonneville: Treating Gregory [name]/Extender: Gregory [name] in Treatment: 18 Edema Assessment Assessed: [Left: No] [Right: No] E[Left: dema] [Right: :] Calf Left: Right: Point of Measurement: From Medial Instep 30 cm 30.5 cm Ankle Left: Right: Point of Measurement: From Medial Instep 20 cm 20 cm Electronic Signature(s) Signed: 09/08/2021 5:58:04 PM By: Gregory Catholic RN Entered By: Gregory [name] on 09/08/2021 12:43:33 -------------------------------------------------------------------------------- Multi Wound Chart Details Patient Name: Date of Service: Gregory [name], Gregory [name] Gregory [name] LD 09/08/2021 12:30 PM Medical Record Number: [id number] Patient Account Number: [id number] Date of Birth/Sex: Treating RN: 05/29/1920 (88 y.o. Gregory [name] Primary Care Gregory [name]: Gregory [name] Other Clinician: Referring Gregory [name]: Treating Gregory [name]/Extender: Gregory [name] in Treatment: 18 Vital Signs Height(in): Pulse(bpm): 21 Weight(lbs): Blood Pressure(mmHg): 136/71 Body Mass  Index(BMI): Temperature(F): 97.7 Respiratory Rate(breaths/min): 18 Photos: Right T Great oe Left T Great oe Right, Lateral Foot Wound Location: Pressure Injury Pressure Injury Gradually Appeared Wounding Event: Arterial Insufficiency Ulcer Arterial Insufficiency Ulcer Pressure Ulcer Primary Etiology: Arrhythmia, Hypertension Arrhythmia, Hypertension Arrhythmia, Hypertension Comorbid History: 02/12/2021 02/12/2021 04/12/2021 Date Acquired: '18 18 13 '$ Weeks of Treatment: Open Open Open Wound Status: No No No Wound Recurrence: 0.4x0.7x0.1 0.4x0.6x0.1 1.2x1x0.1 Measurements L x W x D (cm) 0.22 0.188 0.942 Gregory [name] (cm) : rea 0.022 0.019 0.094 Volume (cm) : 95.30% 95.20% -1904.30% % Reduction in Area: 95.30% 95.20% -1780.00% % Reduction in  Volume: Full Thickness Without Exposed Unclassifiable Category/Stage II Classification: Support Structures Medium Small Small Exudate Gregory [name] mount: Serosanguineous Serosanguineous Serosanguineous Exudate Type: red, brown red, brown red, brown Exudate Color: Flat and Intact Flat and Intact Flat and Intact Wound Margin: Small (1-33%) None Present (0%) Small (1-33%) Granulation Gregory [name] mount: Red N/Gregory [name] Pink Granulation Quality: Large (67-100%) Large (67-100%) Large (67-100%) Necrotic Gregory [name] mount: Eschar, Adherent Slough Eschar, Adherent Slough Eschar, Adherent Slough Necrotic Tissue: Fat Layer (Subcutaneous Tissue): Yes Fat Layer (Subcutaneous Tissue): Yes Fat Layer (Subcutaneous Tissue): Yes Exposed Structures: Fascia: No Fascia: No Fascia: No Tendon: No Tendon: No Tendon: No Muscle: No Muscle: No Muscle: No Joint: No Joint: No Joint: No Bone: No Bone: No Bone: No Large (67-100%) Large (67-100%) Small (1-33%) Epithelialization: Debridement - Excisional Debridement - Excisional Debridement - Excisional Debridement: Pre-procedure Verification/Time Out 13:30 13:30 13:30 Taken: Lidocaine 5% topical ointment Lidocaine 5% topical ointment  Lidocaine 5% topical ointment Pain Control: Subcutaneous, Slough Subcutaneous, Slough Subcutaneous Tissue Debrided: Skin/Subcutaneous Tissue Skin/Subcutaneous Tissue Skin/Subcutaneous Tissue Level: 0.28 0.24 1.2 Debridement Gregory [name] (sq cm): rea Curette Curette Curette Instrument: Minimum Minimum Minimum Bleeding: Pressure Pressure Pressure Hemostasis Gregory [name] chieved: 0 0 0 Procedural Pain: 0 0 0 Post Procedural Pain: Procedure was tolerated well Procedure was tolerated well Procedure was tolerated well Debridement Treatment Response: 0.4x0.7x0.1 0.4x0.6x0.1 1.2x1x0.1 Post Debridement Measurements L x W x D (cm) 0.022 0.019 0.094 Post Debridement Volume: (cm) N/Gregory [name] N/Gregory [name] Category/Stage II Post Debridement Stage: Debridement Debridement Debridement Procedures Performed: Wound Number: '5 6 7 '$ Photos: Right, Medial T Great oe Right Calcaneus Left, Lateral Foot Wound Location: Blister Pressure Injury Gradually Appeared Wounding Event: Pressure Ulcer Pressure Ulcer Pressure Ulcer Primary Etiology: Arrhythmia, Hypertension Arrhythmia, Hypertension Arrhythmia, Hypertension Comorbid History: 06/12/2021 06/24/2021 08/31/2021 Date Acquired: '11 9 1 '$ Weeks of Treatment: Open Open Open Wound Status: No No No Wound Recurrence: 1.8x1.5x0.1 0.1x0.1x0.1 0.7x0.6x0.1 Measurements L x W x D (cm) 2.121 0.008 0.33 Gregory [name] (cm) : rea 0.212 0.001 0.033 Volume (cm) : -2887.30% 74.20% 83.00% % Reduction in Gregory [name] rea: -2928.60% 83.30% 83.00% % Reduction in Volume: Category/Stage II Category/Stage II Category/Stage II Classification: Medium None Present Medium Exudate Gregory [name] mount: Serosanguineous N/Gregory [name] Serosanguineous Exudate Type: red, brown N/Gregory [name] red, brown Exudate Color: N/Gregory [name] N/Gregory [name] N/Gregory [name] Wound Margin: Small (1-33%) None Present (0%) Small (1-33%) Granulation Gregory [name] mount: Red N/Gregory [name] Pink Granulation Quality: Large (67-100%) Large (67-100%) Large (67-100%) Necrotic Gregory [name] mount: Eschar, Adherent Slough Eschar Eschar,  Adherent Slough Necrotic Tissue: Fat Layer (Subcutaneous Tissue): Yes Fat Layer (Subcutaneous Tissue): Yes Fat Layer (Subcutaneous Tissue): Yes Exposed Structures: Fascia: No Fascia: No Fascia: No Tendon: No Tendon: No Tendon: No Muscle: No Muscle: No Muscle: No Joint: No Joint: No Joint: No Bone: No Bone: No Bone: No None Large (67-100%) None Epithelialization: Debridement - Excisional Debridement - Excisional Debridement - Selective/Open Wound Debridement: Pre-procedure Verification/Time Out 13:30 13:30 13:30 Taken: Lidocaine 5% topical ointment Lidocaine 5% topical ointment Lidocaine 5% topical ointment Pain Control: Subcutaneous, Slough Subcutaneous Necrotic/Eschar Tissue Debrided: Skin/Subcutaneous Tissue Skin/Subcutaneous Tissue Non-Viable Tissue Level: 2.7 0.01 0.42 Debridement Gregory [name] (sq cm): rea Curette Curette Curette Instrument: Minimum Minimum Minimum Bleeding: Pressure Pressure Pressure Hemostasis Gregory [name] chieved: 0 0 0 Procedural Pain: 0 0 0 Post Procedural Pain: Procedure was tolerated well Procedure was tolerated well Procedure was tolerated well Debridement Treatment Response: 1.8x1.5x0.1 0.1x0.1x0.1 0.7x0.6x0.1 Post Debridement Measurements L x W x D (cm) 0.212 0.001 0.033 Post Debridement Volume: (cm) Category/Stage II Category/Stage II Category/Stage II Post Debridement Stage: Debridement Debridement Debridement Procedures Performed: Treatment Notes  Electronic Signature(s) Signed: 09/08/2021 2:24:13 PM By: Fredirick Maudlin MD FACS Signed: 09/08/2021 5:58:04 PM By: Gregory Catholic RN Entered By: Fredirick Maudlin on 09/08/2021 14:24:13 -------------------------------------------------------------------------------- Multi-Disciplinary Care Plan Details Patient Name: Date of Service: Gregory [name], Gregory [name] Gregory [name] LD 09/08/2021 12:30 PM Medical Record Number: [id number] Patient Account Number: [id number] Date of Birth/Sex: Treating RN: 1920-05-24 (88 y.o. Gregory [name] Primary Care Thressa Shiffer: Gregory [name] Other Clinician: Referring Vidal Lampkins: Treating Esteven Overfelt/Extender: Gregory [name] in Treatment: 18 Multidisciplinary Care Plan reviewed with physician Active Inactive Abuse / Safety / Falls / Self Care Management Nursing Diagnoses: Potential for falls Potential for injury related to falls Goals: Patient Gregory not experience any injury related to falls Date Initiated: 05/05/2021 Target Resolution Date: 10/12/2021 Goal Status: Active Patient/caregiver Gregory verbalize/demonstrate measures taken to prevent injury and/or falls Date Initiated: 05/05/2021 Target Resolution Date: 10/12/2021 Goal Status: Active Interventions: Assess Activities of Daily Living upon admission and as needed Assess fall risk on admission and as needed Assess: immobility, friction, shearing, incontinence upon admission and as needed Assess impairment of mobility on admission and as needed per policy Assess personal safety and home safety (as indicated) on admission and as needed Assess self care needs on admission and as needed Provide education on fall prevention Provide education on personal and home safety Notes: Nutrition Nursing Diagnoses: Potential for alteratiion in Nutrition/Potential for imbalanced nutrition Goals: Patient/caregiver agrees to and verbalizes understanding of need to use nutritional supplements and/or vitamins as prescribed Date Initiated: 05/05/2021 Target Resolution Date: 10/12/2021 Goal Status: Active Interventions: Assess HgA1c results as ordered upon admission and as needed Assess patient nutrition upon admission and as needed per policy Provide education on elevated blood sugars and impact on wound healing Provide education on nutrition Treatment Activities: Education provided on Nutrition : 05/05/2021 Notes: Wound/Skin Impairment Nursing Diagnoses: Impaired tissue integrity Knowledge deficit  related to ulceration/compromised skin integrity Goals: Patient/caregiver Gregory verbalize understanding of skin care regimen Date Initiated: 05/05/2021 Target Resolution Date: 10/12/2021 Goal Status: Active Interventions: Assess patient/caregiver ability to obtain necessary supplies Assess patient/caregiver ability to perform ulcer/skin care regimen upon admission and as needed Assess ulceration(s) every visit Provide education on ulcer and skin care Notes: Electronic Signature(s) Signed: 09/08/2021 5:58:04 PM By: Gregory Catholic RN Entered By: Gregory [name] on 09/08/2021 14:27:47 -------------------------------------------------------------------------------- Pain Assessment Details Patient Name: Date of Service: Gregory [name] Gregory [name] LD 09/08/2021 12:30 PM Medical Record Number: [id number] Patient Account Number: [id number] Date of Birth/Sex: Treating RN: 1920/02/25 (88 y.o. Gregory [name] Primary Care Diavion Labrador: Gregory [name] Other Clinician: Referring Haydin Calandra: Treating Mikiya Nebergall/Extender: Gregory [name] in Treatment: 18 Active Problems Location of Pain Severity and Description of Pain Patient Has Paino No Site Locations Pain Management and Medication Current Pain Management: Electronic Signature(s) Signed: 09/08/2021 5:58:04 PM By: Gregory Catholic RN Entered By: Gregory [name] on 09/08/2021 12:43:29 -------------------------------------------------------------------------------- Patient/Caregiver Education Details Patient Name: Date of Service: Gregory [name] Gregory [name] LD 7/28/2023andnbsp12:30 PM Medical Record Number: [id number] Patient Account Number: [id number] Date of Birth/Gender: Treating RN: December 07, 1920 (88 y.o. Gregory [name] Primary Care Physician: Gregory [name] Other Clinician: Referring Physician: Treating Physician/Extender: Gregory [name] in Treatment: 18 Education Assessment Education Provided  To: Patient Education Topics Provided Wound/Skin Impairment: Methods: Explain/Verbal Responses: Return demonstration correctly Electronic Signature(s) Signed: 09/08/2021 5:58:04 PM By: Gregory Catholic RN Entered By: Gregory [name] on 09/08/2021 14:28:05 -------------------------------------------------------------------------------- Wound Assessment Details Patient Name: Date of Service: Gregory [name], Gregory [name] Gregory [name] LD 09/08/2021 12:30 PM Medical Record Number: [id number] Patient Account  Number: [id number] Date of Birth/Sex: Treating RN: 1920-08-29 (89 y.o. Gregory [name] Primary Care Aika Brzoska: Gregory [name] Other Clinician: Referring Sheikh Leverich: Treating Mareesa Gathright/Extender: Gregory [name] in Treatment: 18 Wound Status Wound Number: 1 Primary Etiology: Arterial Insufficiency Ulcer Wound Location: Right T Great oe Wound Status: Open Wounding Event: Pressure Injury Comorbid History: Arrhythmia, Hypertension Date Acquired: 02/12/2021 Weeks Of Treatment: 18 Clustered Wound: No Photos Photo Uploaded By: Donavan Burnet on 09/08/2021 13:58:14 Wound Measurements Length: (cm) 0.4 Width: (cm) 0.7 Depth: (cm) 0.1 Area: (cm) 0.22 Volume: (cm) 0.022 % Reduction in Area: 95.3% % Reduction in Volume: 95.3% Epithelialization: Large (67-100%) Tunneling: No Undermining: No Wound Description Classification: Full Thickness Without Exposed Support Structures Wound Margin: Flat and Intact Exudate Amount: Medium Exudate Type: Serosanguineous Exudate Color: red, brown Foul Odor After Cleansing: No Slough/Fibrino Yes Wound Bed Granulation Amount: Small (1-33%) Exposed Structure Granulation Quality: Red Fascia Exposed: No Necrotic Amount: Large (67-100%) Fat Layer (Subcutaneous Tissue) Exposed: Yes Necrotic Quality: Eschar, Adherent Slough Tendon Exposed: No Muscle Exposed: No Joint Exposed: No Bone Exposed: No Treatment Notes Wound #1 (Toe Great) Wound  Laterality: Right Cleanser Soap and Water Discharge Instruction: May shower and wash wound with dial antibacterial soap and water prior to dressing change. Wound Cleanser Discharge Instruction: Cleanse the wound with wound cleanser or normal saline prior to applying Gregory [name] clean dressing using gauze sponges, not tissue or cotton balls. Peri-Wound Care Topical Gentamicin Discharge Instruction: As directed by physician Primary Dressing KerraCel Ag Gelling Fiber Dressing, 2x2 in (silver alginate) Discharge Instruction: Apply silver alginate to wound bed as instructed Secondary Dressing Woven Gauze Sponges 2x2 in Discharge Instruction: Apply over primary dressing as directed. Secured With Conforming Stretch Gauze Bandage, Sterile 2x75 (in/in) Discharge Instruction: Secure with stretch gauze as directed. Compression Wrap Compression Stockings Add-Ons Electronic Signature(s) Signed: 09/08/2021 5:58:04 PM By: Gregory Catholic RN Entered By: Gregory [name] on 09/08/2021 13:01:36 -------------------------------------------------------------------------------- Wound Assessment Details Patient Name: Date of Service: Gregory [name] Gregory [name] LD 09/08/2021 12:30 PM Medical Record Number: [id number] Patient Account Number: [id number] Date of Birth/Sex: Treating RN: 1920/08/19 (88 y.o. Gregory [name] Primary Care Delise Simenson: Gregory [name] Other Clinician: Referring Valyn Latchford: Treating Mathew Postiglione/Extender: Gregory [name] in Treatment: 18 Wound Status Wound Number: 2 Primary Etiology: Arterial Insufficiency Ulcer Wound Location: Left T Great oe Wound Status: Open Wounding Event: Pressure Injury Comorbid History: Arrhythmia, Hypertension Date Acquired: 02/12/2021 Weeks Of Treatment: 18 Clustered Wound: No Photos Photo Uploaded By: Donavan Burnet on 09/08/2021 13:58:36 Wound Measurements Length: (cm) 0.4 Width: (cm) 0.6 Depth: (cm) 0.1 Area: (cm) 0.188 Volume:  (cm) 0.019 % Reduction in Area: 95.2% % Reduction in Volume: 95.2% Epithelialization: Large (67-100%) Tunneling: No Undermining: No Wound Description Classification: Unclassifiable Wound Margin: Flat and Intact Exudate Amount: Small Exudate Type: Serosanguineous Exudate Color: red, brown Foul Odor After Cleansing: No Slough/Fibrino Yes Wound Bed Granulation Amount: None Present (0%) Exposed Structure Necrotic Amount: Large (67-100%) Fascia Exposed: No Necrotic Quality: Eschar, Adherent Slough Fat Layer (Subcutaneous Tissue) Exposed: Yes Tendon Exposed: No Muscle Exposed: No Joint Exposed: No Bone Exposed: No Treatment Notes Wound #2 (Toe Great) Wound Laterality: Left Cleanser Soap and Water Discharge Instruction: May shower and wash wound with dial antibacterial soap and water prior to dressing change. Wound Cleanser Discharge Instruction: Cleanse the wound with wound cleanser or normal saline prior to applying Gregory [name] clean dressing using gauze sponges, not tissue or cotton balls. Peri-Wound Care Topical Gentamicin Discharge Instruction: As directed by physician Primary Dressing KerraCel Ag Heidi Dach  Fiber Dressing, 2x2 in (silver alginate) Discharge Instruction: Apply silver alginate to wound bed as instructed Secondary Dressing Woven Gauze Sponges 2x2 in Discharge Instruction: Apply over primary dressing as directed. Secured With Conforming Stretch Gauze Bandage, Sterile 2x75 (in/in) Discharge Instruction: Secure with stretch gauze as directed. Compression Wrap Compression Stockings Add-Ons Electronic Signature(s) Signed: 09/08/2021 5:58:04 PM By: Gregory Catholic RN Entered By: Gregory [name] on 09/08/2021 13:27:28 -------------------------------------------------------------------------------- Wound Assessment Details Patient Name: Date of Service: Gregory [name], Gregory [name] Gregory [name] LD 09/08/2021 12:30 PM Medical Record Number: [id number] Patient Account Number: [id number] Date of  Birth/Sex: Treating RN: January 18, 1921 (88 y.o. Gregory [name] Primary Care Amit Leece: Gregory [name] Other Clinician: Referring Vernadine Coombs: Treating Tayanna Talford/Extender: Gregory [name] in Treatment: 18 Wound Status Wound Number: 4 Primary Etiology: Pressure Ulcer Wound Location: Right, Lateral Foot Wound Status: Open Wounding Event: Gradually Appeared Comorbid History: Arrhythmia, Hypertension Date Acquired: 04/12/2021 Weeks Of Treatment: 13 Clustered Wound: No Photos Photo Uploaded By: Donavan Burnet on 09/08/2021 13:58:14 Wound Measurements Length: (cm) 1.2 Width: (cm) 1 Depth: (cm) 0.1 Area: (cm) 0.942 Volume: (cm) 0.094 % Reduction in Area: -1904.3% % Reduction in Volume: -1780% Epithelialization: Small (1-33%) Tunneling: No Undermining: No Wound Description Classification: Category/Stage II Wound Margin: Flat and Intact Exudate Amount: Small Exudate Type: Serosanguineous Exudate Color: red, brown Foul Odor After Cleansing: No Slough/Fibrino Yes Wound Bed Granulation Amount: Small (1-33%) Exposed Structure Granulation Quality: Pink Fascia Exposed: No Necrotic Amount: Large (67-100%) Fat Layer (Subcutaneous Tissue) Exposed: Yes Necrotic Quality: Eschar, Adherent Slough Tendon Exposed: No Muscle Exposed: No Joint Exposed: No Bone Exposed: No Treatment Notes Wound #4 (Foot) Wound Laterality: Right, Lateral Cleanser Soap and Water Discharge Instruction: May shower and wash wound with dial antibacterial soap and water prior to dressing change. Wound Cleanser Discharge Instruction: Cleanse the wound with wound cleanser or normal saline prior to applying Gregory [name] clean dressing using gauze sponges, not tissue or cotton balls. Peri-Wound Care Topical Gentamicin Discharge Instruction: As directed by physician Primary Dressing KerraCel Ag Gelling Fiber Dressing, 2x2 in (silver alginate) Discharge Instruction: Apply silver alginate to  wound bed as instructed Secondary Dressing ALLEVYN Gentle Border, 3x3 (in/in) Discharge Instruction: Apply over primary dressing as directed. Woven Gauze Sponges 2x2 in Discharge Instruction: Apply over primary dressing as directed. Secured With Compression Wrap Compression Stockings Add-Ons Mepilex Plus Discharge Instruction: if available Electronic Signature(s) Signed: 09/08/2021 5:58:04 PM By: Gregory Catholic RN Entered By: Gregory [name] on 09/08/2021 12:59:02 -------------------------------------------------------------------------------- Wound Assessment Details Patient Name: Date of Service: Gregory [name] Gregory [name] LD 09/08/2021 12:30 PM Medical Record Number: [id number] Patient Account Number: [id number] Date of Birth/Sex: Treating RN: 1920/04/18 (88 y.o. Gregory [name] Primary Care Marti Acebo: Gregory [name] Other Clinician: Referring Varie Machamer: Treating Cadi Rhinehart/Extender: Gregory [name] in Treatment: 18 Wound Status Wound Number: 5 Primary Etiology: Pressure Ulcer Wound Location: Right, Medial T Great oe Wound Status: Open Wounding Event: Blister Comorbid History: Arrhythmia, Hypertension Date Acquired: 06/12/2021 Weeks Of Treatment: 11 Clustered Wound: No Photos Photo Uploaded By: Donavan Burnet on 09/08/2021 13:58:14 Wound Measurements Length: (cm) 1.8 Width: (cm) 1.5 Depth: (cm) 0.1 Area: (cm) 2.121 Volume: (cm) 0.212 % Reduction in Area: -2887.3% % Reduction in Volume: -2928.6% Epithelialization: None Tunneling: No Undermining: No Wound Description Classification: Category/Stage II Exudate Amount: Medium Exudate Type: Serosanguineous Exudate Color: red, brown Foul Odor After Cleansing: No Slough/Fibrino Yes Wound Bed Granulation Amount: Small (1-33%) Exposed Structure Granulation Quality: Red Fascia Exposed: No Necrotic Amount: Large (67-100%) Fat Layer (Subcutaneous Tissue) Exposed: Yes Necrotic Quality:  Eschar,  Adherent Slough Tendon Exposed: No Muscle Exposed: No Joint Exposed: No Bone Exposed: No Treatment Notes Wound #5 (Toe Great) Wound Laterality: Right, Medial Cleanser Soap and Water Discharge Instruction: May shower and wash wound with dial antibacterial soap and water prior to dressing change. Wound Cleanser Discharge Instruction: Cleanse the wound with wound cleanser or normal saline prior to applying Gregory [name] clean dressing using gauze sponges, not tissue or cotton balls. Peri-Wound Care Topical Gentamicin Discharge Instruction: As directed by physician Primary Dressing KerraCel Ag Gelling Fiber Dressing, 2x2 in (silver alginate) Discharge Instruction: Apply silver alginate to wound bed as instructed Secondary Dressing Woven Gauze Sponges 2x2 in Discharge Instruction: Apply over primary dressing as directed. Secured With Conforming Stretch Gauze Bandage, Sterile 2x75 (in/in) Discharge Instruction: Secure with stretch gauze as directed. Compression Wrap Compression Stockings Add-Ons Electronic Signature(s) Signed: 09/08/2021 5:58:04 PM By: Gregory Catholic RN Entered By: Gregory [name] on 09/08/2021 13:01:15 -------------------------------------------------------------------------------- Wound Assessment Details Patient Name: Date of Service: Gregory [name] Gregory [name] LD 09/08/2021 12:30 PM Medical Record Number: [id number] Patient Account Number: [id number] Date of Birth/Sex: Treating RN: 05-21-1920 (88 y.o. Gregory [name] Primary Care Webber Michiels: Gregory [name] Other Clinician: Referring Rilley Poulter: Treating Anusha Claus/Extender: Gregory [name] in Treatment: 18 Wound Status Wound Number: 6 Primary Etiology: Pressure Ulcer Wound Location: Right Calcaneus Wound Status: Healed - Epithelialized Wounding Event: Pressure Injury Comorbid History: Arrhythmia, Hypertension Date Acquired: 06/24/2021 Weeks Of Treatment: 9 Clustered Wound: No Photos Wound  Measurements Length: (cm) Width: (cm) Depth: (cm) Area: (cm) Volume: (cm) 0 % Reduction in Area: 100% 0 % Reduction in Volume: 100% 0 Epithelialization: Large (67-100%) 0 Tunneling: No 0 Undermining: No Wound Description Classification: Category/Stage II Exudate Amount: None Present Foul Odor After Cleansing: No Slough/Fibrino No Wound Bed Granulation Amount: None Present (0%) Exposed Structure Necrotic Amount: None Present (0%) Fascia Exposed: No Fat Layer (Subcutaneous Tissue) Exposed: No Tendon Exposed: No Muscle Exposed: No Joint Exposed: No Bone Exposed: No Treatment Notes Wound #6 (Calcaneus) Wound Laterality: Right Cleanser Soap and Water Discharge Instruction: May shower and wash wound with dial antibacterial soap and water prior to dressing change. Wound Cleanser Discharge Instruction: Cleanse the wound with wound cleanser or normal saline prior to applying Gregory [name] clean dressing using gauze sponges, not tissue or cotton balls. Peri-Wound Care Topical Gentamicin Discharge Instruction: As directed by physician Primary Dressing KerraCel Ag Gelling Fiber Dressing, 2x2 in (silver alginate) Discharge Instruction: Apply silver alginate to wound bed as instructed Secondary Dressing ALLEVYN Gentle Border, 3x3 (in/in) Discharge Instruction: Apply over primary dressing as directed. ALLEVYN Heel 4 1/2in x 5 1/2in / 10.5cm x 13.5cm Discharge Instruction: Apply over primary dressing as directed. Woven Gauze Sponges 2x2 in Discharge Instruction: Apply over primary dressing as directed. Secured With Compression Wrap Compression Stockings Environmental education officer) Signed: 09/08/2021 6:17:45 PM By: Gregory Catholic RN Previous Signature: 09/08/2021 5:58:04 PM Version By: Gregory Catholic RN Entered By: Gregory [name] on 09/08/2021 18:00:23 -------------------------------------------------------------------------------- Wound Assessment Details Patient Name: Date of  Service: Gregory [name], Gregory [name] Gregory [name] LD 09/08/2021 12:30 PM Medical Record Number: [id number] Patient Account Number: [id number] Date of Birth/Sex: Treating RN: [birth year] (88 y.o. Gregory [name] Primary Care Shandell Giovanni: Gregory [name] Other Clinician: Referring Miosha Behe: Treating Jalyn Rosero/Extender: Gregory [name] in Treatment: 18 Wound Status Wound Number: 7 Primary Etiology: Pressure Ulcer Wound Location: Left, Lateral Foot Wound Status: Open Wounding Event: Gradually Appeared Comorbid History: Arrhythmia, Hypertension Date Acquired: 08/31/2021 Weeks Of Treatment: 1 Clustered Wound: No Photos Photo Uploaded By:  Donavan Burnet on 09/08/2021 13:58:36 Wound Measurements Length: (cm) 0.7 Width: (cm) 0.6 Depth: (cm) 0.1 Area: (cm) 0.33 Volume: (cm) 0.033 % Reduction in Area: 83% % Reduction in Volume: 83% Epithelialization: None Tunneling: No Undermining: No Wound Description Classification: Category/Stage II Exudate Amount: Medium Exudate Type: Serosanguineous Exudate Color: red, brown Foul Odor After Cleansing: No Slough/Fibrino Yes Wound Bed Granulation Amount: Small (1-33%) Exposed Structure Granulation Quality: Pink Fascia Exposed: No Necrotic Amount: Large (67-100%) Fat Layer (Subcutaneous Tissue) Exposed: Yes Necrotic Quality: Eschar, Adherent Slough Tendon Exposed: No Muscle Exposed: No Joint Exposed: No Bone Exposed: No Treatment Notes Wound #7 (Foot) Wound Laterality: Left, Lateral Cleanser Soap and Water Discharge Instruction: May shower and wash wound with dial antibacterial soap and water prior to dressing change. Wound Cleanser Discharge Instruction: Cleanse the wound with wound cleanser or normal saline prior to applying Gregory [name] clean dressing using gauze sponges, not tissue or cotton balls. Peri-Wound Care Topical Gentamicin Discharge Instruction: As directed by physician Primary Dressing KerraCel Ag Gelling Fiber Dressing,  2x2 in (silver alginate) Discharge Instruction: Apply silver alginate to wound bed as instructed Secondary Dressing ALLEVYN Gentle Border, 4x4 (in/in) Discharge Instruction: Apply over primary dressing as directed. Woven Gauze Sponges 2x2 in Discharge Instruction: Apply over primary dressing as directed. Secured With Compression Wrap Compression Stockings Add-Ons Mepilex plus Discharge Instruction: if available Electronic Signature(s) Signed: 09/08/2021 5:58:04 PM By: Gregory Catholic RN Entered By: Gregory [name] on 09/08/2021 13:05:46 -------------------------------------------------------------------------------- Vitals Details Patient Name: Date of Service: Gregory [name], Gregory [name] Gregory [name] LD 09/08/2021 12:30 PM Medical Record Number: [id number] Patient Account Number: [id number] Date of Birth/Sex: Treating RN: 05/15/1920 (88 y.o. Gregory [name] Primary Care Devaughn Savant: Gregory [name] Other Clinician: Referring Angelyn Osterberg: Treating Arbor Leer/Extender: Gregory [name] in Treatment: 18 Vital Signs Time Taken: 14:42 Temperature (F): 97.7 Pulse (bpm): 75 Respiratory Rate (breaths/min): 18 Blood Pressure (mmHg): 136/71 Reference Range: 80 - 120 mg / dl Electronic Signature(s) Signed: 09/08/2021 5:58:04 PM By: Gregory Catholic RN Entered By: Gregory [name] on 09/08/2021 12:42:52

## 2021-09-08 NOTE — Progress Notes (Unsigned)
Care   Gregory [name] Date of Birth: 03-14-1920 Medical Record #[id number]  History of Present Illness: [name] is seen for evaluation.  He has a history of atrial flutter and pacemaker. Much of his cardiac care had been at the [facility] clinic in [place]. He reports that he had significant bradycardia in 2002 and had a pacemaker placed ([employer]tr[employer]). This was revised in 2010.  He has a history of atrial flutter and had a successful ablation in the past.   When  seen by me in January 2018  he was found to be in atrial flutter and was tracking this at a high rate. He was started on Eliquis. Seen by Dr. [name] and pacemaker reprogrammed so he wouldn't track as fast. Attempted to pace terminate his Aflutter without success. When seen later in April 2018 he was found to be back in NSR. Last generator change at [facility] in May 2020.   In September 2020 he underwent TAVR with a #26 Sapien S3 Ultra valve in [place], [place]. Cardiac cath at that time showed obstructive disease in the distal RCA treated medically. Follow up Echo in October 2020 was satisfactory. He has remained in Afib.   He had follow up with [facility] clinic in [place] in the fall. CXR Ok. Ecg showed AFib with pacing. He has chronic anemia and CKD. Echo was stable.   He was admitted at [facility] in June 2022 with Covid 19 PNA. Treated with 3 days of remdesivir. Did not recover well from this with significant weight loss and failure to thrive.   I last saw him when he was hospitalized in January with recurrent GI bleed. Prior to this he was at  [facility] in [place] with a GI bleed and was transfused multiple units of blood. EGD apparently showed gastric erosions. Ultimately underwent PEG placement for poor nutritional status/ malnutrition and weight loss.  He was continued on Eliquis at [facility]. He was admitted here shortly after with recurrent and ongoing GI bleeding with Hgb to 5.9. He was transfused again. Stools still black and heme  +. We recommended he discontinue Eliquis due to high bleeding risk. He was very deconditioned during that admission and underwent inpatient Rehab until Feb 17 when he was readmitted  2/17-2/23 with Covid 19 associated with fever, fatigue, cough. Treated with steroids and remdesivir. Returned to Rehab until 04/14/21.   He was seen by Gae Gallop MD at [facility] in March for dry gangrene on the tips of both great toes in addition to a pressure sore on the left heel.  This is was felt to be a limb threatening situation.  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  consideration was given for endovascular options for revascularization.  He is clearly not a candidate for a bypass.   In the interim he was admitted 4/7-4/14/23 with acute CHF. Diuresed. Was transfused 1 unit of blood. Had AKI. Echo showed LV EF 55%, with no wall motion abnormalities, RV systolic function preserved, moderate TR, no aortic stenosis (SP TAVR). plan was for palliative care evaluation as outpatient given very poor prognosis.   Patient admitted in early July with generalized weakness. -On exam patient was noted to have trace leg edema, positive JVD, crackles on auscultation, elevated BNP and chest x-ray suggestive of bilateral basilar edema.  -He was given Lasix 20 mg IV twice daily with more than 3 L of diuresis.-PTA on Lasix 20 mg daily  as needed.  Continue the same plan at home.   On follow up today he is seen with his daughter. He is living at home with family. Really not able to do anything. Denies any pain. No SOB. Feels weak in the last couple of days. Is being followed at wound center and reports his gangrene is stable if not better.     Allergies as of 09/13/2021       Reactions   Cefepime Other (See Comments)   Severe myoclonus for cefepime- don't take again-    Amlodipine Besylate    Other reaction(s): edema    Penicillins Hives, Rash        Medication List        Accurate as of September 08, 2021  1:59 PM. If you have any questions, ask your nurse or doctor.          atorvastatin 40 MG tablet Commonly known as: LIPITOR Take 40 mg by mouth daily.   chlorhexidine 0.12 % solution Commonly known as: PERIDEX 15 mLs by Mouth Rinse route 2 (two) times daily.   clopidogrel 75 MG tablet Commonly known as: PLAVIX Place 1 tablet (75 mg total) into feeding tube daily.   famotidine 40 MG/5ML suspension Commonly known as: PEPCID Place 2.5 mLs (20 mg total) into feeding tube at bedtime.   feeding supplement (NEPRO CARB STEADY) Liqd Place 315 mLs into feeding tube 3 (three) times daily. What changed: how much to take   ferrous sulfate 300 (60 Fe) MG/5ML syrup Take by mouth.   free water Soln Place 300 mLs into feeding tube 4 (four) times daily.   furosemide 20 MG tablet Commonly known as: LASIX Take 1 tablet Per J Tube daily as needed for edema or fluid (Take as needed for shortness of breath, leg swelling or weight gain 3 lbs in 24-48 hrs to 5 lbs in 7 days.).   levothyroxine 75 MCG tablet Commonly known as: SYNTHROID Place 1 tablet (75 mcg total) into feeding tube daily at 6 (six) AM.   melatonin 5 MG Tabs Place 1 tablet (5 mg total) into feeding tube at bedtime as needed. What changed: reasons to take this   potassium chloride 20 MEQ/15ML (10%) Soln Take by mouth.   PROBIOTIC PO Give 1 capsule by tube daily.   senna-docusate 8.6-50 MG tablet Commonly known as: Senokot-S Place 2 tablets into feeding tube at bedtime.         Allergies  Allergen Reactions   Cefepime Other (See Comments)    Severe myoclonus for cefepime- don't take again-    Amlodipine Besylate     Other reaction(s): edema   Penicillins Hives and Rash    Past Medical History:  Diagnosis Date   Atrial flutter ([place])    s/p ablation   Cancer ([place])    skin cancer   Cataracts, bilateral     Hypertension    Neuromuscular disorder ([place])    Pacemaker 2010   Thyroid disease     Past Surgical History:  Procedure Laterality Date   APPENDECTOMY     COLONOSCOPY     EYE SURGERY Bilateral    cataract surgery   HERNIA REPAIR Right    INGUINAL HERNIA REPAIR Left 10/12/2013   Procedure: LEFT  INGUINAL HERNIA REPAIR;  Surgeon: Odis Hollingshead, MD;  Location: [facility];  Service: General;  Laterality: Left;   INSERTION OF MESH Left 10/12/2013   Procedure: INSERTION OF MESH;  Surgeon: Odis Hollingshead, MD;  Location:  [facility] OR;  Service: General;  Laterality: Left;   PEG TUBE PLACEMENT Left     Social History   Socioeconomic History   Marital status: Widowed    Spouse name: Not on file   Number of children: 2   Years of education: Not on file   Highest education level: Not on file  Occupational History   Occupation: retired Actor  Tobacco Use   Smoking status: Never   Smokeless tobacco: Never  Vaping Use   Vaping Use: Never used  Substance and Sexual Activity   Alcohol use: No   Drug use: No   Sexual activity: Not Currently  Other Topics Concern   Not on file  Social History Narrative   Not on file   Social Determinants of Health   Financial Resource Strain: Not on file  Food Insecurity: Not on file  Transportation Needs: Not on file  Physical Activity: Not on file  Stress: Not on file  Social Connections: Not on file    Family History  Problem Relation Age of Onset   Heart disease Mother    Cancer Father        leukemia    Review of Systems: As noted in HPI.  All other systems were reviewed and are negative.  Physical Exam: There were no vitals taken for this visit. There were no vitals filed for this visit.     GENERAL:  very fraily, elderly WM in NAD HEENT:  PERRL, EOMI, sclera are clear. Oropharynx is clear. NECK:  No jugular venous distention, carotid upstroke brisk and symmetric, no bruits, no thyromegaly or adenopathy LUNGS:  Clear to  auscultation bilaterally CHEST:  Unremarkable HEART:  RRR,  PMI not displaced or sustained,S1 and S2 within normal limits, no S3, no S4: no clicks, no rubs, he has soft 1/6 systolic murmur RUSB. There is a gr 2/6 diastolic murmur upper RSB. ABD:  Soft, nontender. BS +, no masses or bruits. No hepatomegaly, no splenomegaly EXT:  no edema NEURO:  Alert and oriented x 3. Cranial nerves II through XII intact. PSYCH:  Cognitively intact    LABORATORY DATA: Lab Results  Component Value Date   WBC 9.2 08/16/2021   HGB 8.4 (L) 08/16/2021   HCT 26.6 (L) 08/16/2021   PLT 126 (L) 08/16/2021   GLUCOSE 125 (H) 08/16/2021   CHOL 99 08/15/2021   TRIG 23 08/15/2021   HDL 47 08/15/2021   LDLCALC 47 08/15/2021   ALT 24 08/14/2021   AST 41 08/14/2021   NA 147 (H) 08/16/2021   K 3.8 08/16/2021   CL 114 (H) 08/16/2021   CREATININE 1.49 (H) 08/16/2021   BUN 90 (H) 08/16/2021   CO2 25 08/16/2021   TSH 3.233 03/02/2021   INR 1.3 (H) 08/14/2021   HGBA1C 5.8 (H) 08/15/2021     Labs dated 04/19/15: cholesterol 153, triglycerides 50, LDL 87, HDL 56. Bun 29, creatinine 1.19. Other CMET normal. Hgb 10.6. Dated 05/23/16: creatinine 1.12. Other chemistries normal. Dated 05/24/17: cholesterol 168, triglycerides 65, HDL 65, LDL 90. Hgb 10.6. Creatinine 1.27. Other chemistries and TSH normal.  Dated 11/15/17: Hgb 10.2. CMET normal. Folate and B12 normal. TSH normal. Dated 09/05/18: cholesterol 178, triglycerides 39, HDL 76, LDL 94.  Dated 12/26/18: Hgb 9.4. creatinine 1.2.  Dated 11/06/19: Hgb 9.5, plts 121K creatinine 1.39. BUN 33.  Dated 02/01/20: creatinine 1.15. Hgb 8.9. otherwise BMET and CBC normal.  Dated 08/19/20: Hgb 9.8, plts 123K. CMET normal.   Cardiac cath 09/24/18: PROCEDURE  TYPES 1.  CORONARY ANGIOGRAPHY 2.  ULTRASOUND GUIDANCE FOR VASCULAR ACCESS FINAL DIAGNOSIS 1.  Moderate coronary artery atherosclerosis PRE-PROCEDURE DIAGNOSIS 1.  Stenosis Aortic Valve Acquired CORONARY DIAGNOSTIC  SUMMARY Coronary artery dominance is right. Normal left main coronary.  The left main coronary artery is 10% obstructed by a discrete lesion.  The proximal left anterior descending artery is 30% obstructed by a discrete lesion. The distal segment is normal size, diseased.  The distal circumflex artery is 50% obstructed by multiple discrete lesions. The distal segment is normal size, diseased.  The middle right coronary artery is 50% obstructed by multiple discrete lesions. The distal segment is normal size, diseased.  The distal right coronary artery is 90% obstructed by a discrete lesion. The distal segment is normal size, diseased.  The right posterior descending artery is 90% obstructed by a discrete lesion. The distal segment is large size.  RADIATION DOSE DATA Procedure cumulative skin dose (mGy): 261.00 Procedure cumulative dose area product (Gy-cm**2): 20.71 Fluoro Time (Min): 9.00 CONTRAST DOSE DATA IOHEXOL 350 MG IODINE/ML INTRAVENOUS SOLUTION: 174m  Echo 12/03/18: Final Impressions 1. ECHOCARDIOGRAM 2. Goal directed echocardiogram 30 day s/p TAVR. 3. LEFT VENTRICLE: 4. Normal left ventricular chamber size. 5. Borderline increased concentric left ventricular wall thickness. 6. Calculated 2-D linear left ventricular ejection fraction 62 %. 7. CARDIAC VALVES: 8. Normal tissue aortic prosthesis. 9. Aortic valve systolic mean Doppler gradient 11 mmHg. 10. Aortic valve peak systolic velocity is, 2.2 m/sec. 11. Trivial aortic valve prosthetic regurgitation. 12. Moderate-severe tricuspid valve regurgitation (no change compared to echo on 08/20/2018). 13. Pacemaker catheter visualized. 14. No pericardial effusion. 15. Compared to the report of 10/29/2018 the following changes have occurred: The prosthetic aortic velocity has increased slightly.  Echo 11/06/19: Final Impressions  1. Normal left ventricular chamber size and wall thickness  2. Calculated 2-D biplane volumetric left  ventricular ejection fraction 70 %.  3. Abnormal ventricular septal motion due to pacing.  4. Normal right ventricular chamber size and systolic function  5. Estimated right ventricular systolic pressure 57 mmHg (systolic blood pressure 1379mmHg).  6. Status post 26 mm Edwards Sapien transcatheter aortic valve bioprosthesis mean gradient 12  mmHg, EOA 1.61 cm2 with trivial periprosthetic regurgitation  7. Mild-moderate mitral valve regurgitation.  8. No pericardial effusion.  9. Compared to the report of 12/03/2018 no significant change has occurred. Side by side  comparison of images performed.  Echo 05/20/21: IMPRESSIONS     1. Left pleural effuson.   2. Pacing wire in RA/RV.   3. Left ventricular ejection fraction, by estimation, is 55%. The left  ventricle has normal function. The left ventricle has no regional wall  motion abnormalities. There is mild left ventricular hypertrophy. Left  ventricular diastolic parameters are  indeterminate.   4. Right ventricular systolic function is normal. The right ventricular  size is normal. There is severely elevated pulmonary artery systolic  pressure.   5. Left atrial size was moderately dilated.   6. Right atrial size was moderately dilated.   7. The mitral valve is abnormal. Trivial mitral valve regurgitation. No  evidence of mitral stenosis.   8. Tricuspid valve regurgitation is moderate.   9. Post TAVR 26 mm Sapien trivial PVL acceptable systolic gradients. The  aortic valve has been repaired/replaced. Aortic valve regurgitation is not  visualized. No aortic stenosis is present. There is a 26 mm Edwards Sapien  prosthetic (TAVR) valve  present in the aortic position. Procedure Date: 10/28/18.  10. The inferior  vena cava is dilated in size with >50% respiratory  variability, suggesting right atrial pressure of 8 mmHg.   Assessment / Plan: 1. History of bradycardia s/p permanent pacemaker. Good pacemaker function. Follow up  In pacer  clinic. Generator change out 2020.  2. History of atrial flutter s/p ablation. Now with permanent Atrial fibrillation. He is asymptomatic. [place] Vasc score of 2 based on age. No anticoagulation due to high bleeding risk. Now on Plavix only due to PAD 3. History of aortic stenosis. S/p TAVR in September 2020. Follow up Echo recently with good valve function 4. CAD with 90% distal RCA and PDA disease. Treated medically. Asymptomatic.  5. CKD stage 3b 6. Chronic anemia. Last Hgb 8. Multifactorial with GI blood loss, malnutrition, CKD.  7. Acute on chronic diastolic CHF. Recent admission. Appears euvolemic today. On lasix PRN. Not able to take other guideline directed therapy due to low BP and CKD.  8. Covid 19 PNA 9. Weight loss- involutional since Covid. Now with PEG tube in place.  10. PAD with dry gangrene of toes. Followed at wound center.   Patient has experienced significant decline in health in past year with multiorgan failure. Little else to offer from a cardiac standpoint. Would support palliative care approach. Has follow up with PCP next week and will defer discussion of this to him.    Follow up in 4  months.

## 2021-09-08 NOTE — Progress Notes (Signed)
TASMAN, ZAPATA ([id number]) Visit Report for 09/08/2021 Chief Complaint Document Details Patient Name: Date of Service: Gregory [name] [facility] LD 09/08/2021 12:30 PM Medical Record Number: [id number] Patient Account Number: [id number] Date of Birth/Sex: Treating RN: Jul 11, 1920 (88 y.o. [name] Primary Care Provider: Wylene Simmer Other Clinician: Referring Provider: Treating Provider/Extender: Roxy Cedar in Treatment: 18 Information Obtained from: Patient Chief Complaint Patient is at the clinic for treatment of an open pressure ulcer on his left heel and dry gangrene of both great toes Electronic Signature(s) Signed: 09/08/2021 2:24:20 PM By: Fredirick Maudlin MD FACS Entered By: Fredirick Maudlin on 09/08/2021 14:24:19 -------------------------------------------------------------------------------- Debridement Details Patient Name: Date of Service: Gregory [name], [name] 09/08/2021 12:30 PM Medical Record Number: [id number] Patient Account Number: [id number] Date of Birth/Sex: Treating RN: 05-Nov-1920 (88 y.o. [name] Primary Care Provider: Wylene Simmer Other Clinician: Referring Provider: Treating Provider/Extender: Roxy Cedar in Treatment: 18 Debridement Performed for Assessment: Wound #1 Right T Great oe Performed By: Physician Fredirick Maudlin, MD Debridement Type: Debridement Severity of Tissue Pre Debridement: Fat layer exposed Level of Consciousness (Pre-procedure): Awake and Alert Pre-procedure Verification/Time Out Yes - 13:30 Taken: Start Time: 13:30 Pain Control: Lidocaine 5% topical ointment T Area Debrided (L x W): otal 0.4 (cm) x 0.7 (cm) = 0.28 (cm) Tissue and other material debrided: Non-Viable, Slough, Subcutaneous, Slough Level: Skin/Subcutaneous Tissue Debridement Description: Excisional Instrument: Curette Bleeding: Minimum Hemostasis Achieved: Pressure End Time:  13:33 Procedural Pain: 0 Post Procedural Pain: 0 Response to Treatment: Procedure was tolerated well Level of Consciousness (Post- Awake and Alert procedure): Post Debridement Measurements of Total Wound Length: (cm) 0.4 Width: (cm) 0.7 Depth: (cm) 0.1 Volume: (cm) 0.022 Character of Wound/Ulcer Post Debridement: Improved Severity of Tissue Post Debridement: Fat layer exposed Post Procedure Diagnosis Same as Pre-procedure Electronic Signature(s) Signed: 09/08/2021 4:01:31 PM By: Fredirick Maudlin MD FACS Signed: 09/08/2021 5:58:04 PM By: Dellie Catholic RN Entered By: Dellie Catholic on 09/08/2021 13:32:14 -------------------------------------------------------------------------------- Debridement Details Patient Name: Date of Service: Gregory [name], [name] 09/08/2021 12:30 PM Medical Record Number: [id number] Patient Account Number: [id number] Date of Birth/Sex: Treating RN: 11/10/1920 (88 y.o. [name] Primary Care Provider: Wylene Simmer Other Clinician: Referring Provider: Treating Provider/Extender: Roxy Cedar in Treatment: 18 Debridement Performed for Assessment: Wound #2 Left T Great oe Performed By: Physician Fredirick Maudlin, MD Debridement Type: Debridement Severity of Tissue Pre Debridement: Fat layer exposed Level of Consciousness (Pre-procedure): Awake and Alert Pre-procedure Verification/Time Out Yes - 13:30 Taken: Start Time: 13:30 Pain Control: Lidocaine 5% topical ointment T Area Debrided (L x W): otal 0.4 (cm) x 0.6 (cm) = 0.24 (cm) Tissue and other material debrided: Non-Viable, Slough, Subcutaneous, Slough Level: Skin/Subcutaneous Tissue Debridement Description: Excisional Instrument: Curette Bleeding: Minimum Hemostasis Achieved: Pressure End Time: 13:33 Procedural Pain: 0 Post Procedural Pain: 0 Response to Treatment: Procedure was tolerated well Level of Consciousness (Post- Awake and  Alert procedure): Post Debridement Measurements of Total Wound Length: (cm) 0.4 Width: (cm) 0.6 Depth: (cm) 0.1 Volume: (cm) 0.019 Character of Wound/Ulcer Post Debridement: Improved Severity of Tissue Post Debridement: Fat layer exposed Post Procedure Diagnosis Same as Pre-procedure Electronic Signature(s) Signed: 09/08/2021 4:01:31 PM By: Fredirick Maudlin MD FACS Signed: 09/08/2021 5:58:04 PM By: Dellie Catholic RN Entered By: Dellie Catholic on 09/08/2021 13:34:00 -------------------------------------------------------------------------------- Debridement Details Patient Name: Date of Service: Gregory [name], [name] 09/08/2021 12:30 PM Medical Record Number: [id number] Patient Account Number: [id number] Date of Birth/Sex: Treating RN:  1920-08-22 (88 y.o. [name] Primary Care Provider: Wylene Simmer Other Clinician: Referring Provider: Treating Provider/Extender: Roxy Cedar in Treatment: 18 Debridement Performed for Assessment: Wound #4 Right,Lateral Foot Performed By: Physician Fredirick Maudlin, MD Debridement Type: Debridement Level of Consciousness (Pre-procedure): Awake and Alert Pre-procedure Verification/Time Out Yes - 13:30 Taken: Start Time: 13:30 Pain Control: Lidocaine 5% topical ointment T Area Debrided (L x W): otal 1.2 (cm) x 1 (cm) = 1.2 (cm) Tissue and other material debrided: Non-Viable, Subcutaneous Level: Skin/Subcutaneous Tissue Debridement Description: Excisional Instrument: Curette Bleeding: Minimum Hemostasis Achieved: Pressure End Time: 13:33 Procedural Pain: 0 Post Procedural Pain: 0 Response to Treatment: Procedure was tolerated well Level of Consciousness (Post- Awake and Alert procedure): Post Debridement Measurements of Total Wound Length: (cm) 1.2 Stage: Category/Stage II Width: (cm) 1 Depth: (cm) 0.1 Volume: (cm) 0.094 Character of Wound/Ulcer Post Debridement: Improved Post Procedure  Diagnosis Same as Pre-procedure Electronic Signature(s) Signed: 09/08/2021 4:01:31 PM By: Fredirick Maudlin MD FACS Signed: 09/08/2021 5:58:04 PM By: Dellie Catholic RN Entered By: Dellie Catholic on 09/08/2021 13:34:59 -------------------------------------------------------------------------------- Debridement Details Patient Name: Date of Service: Gregory [name], [name] 09/08/2021 12:30 PM Medical Record Number: [id number] Patient Account Number: [id number] Date of Birth/Sex: Treating RN: 03/03/1920 (88 y.o. [name] Primary Care Provider: Wylene Simmer Other Clinician: Referring Provider: Treating Provider/Extender: Roxy Cedar in Treatment: 18 Debridement Performed for Assessment: Wound #5 Right,Medial T Great oe Performed By: Physician Fredirick Maudlin, MD Debridement Type: Debridement Level of Consciousness (Pre-procedure): Awake and Alert Pre-procedure Verification/Time Out Yes - 13:30 Taken: Start Time: 13:30 Pain Control: Lidocaine 5% topical ointment T Area Debrided (L x W): otal 1.8 (cm) x 1.5 (cm) = 2.7 (cm) Tissue and other material debrided: Non-Viable, Slough, Subcutaneous, Slough Level: Skin/Subcutaneous Tissue Debridement Description: Excisional Instrument: Curette Bleeding: Minimum Hemostasis Achieved: Pressure End Time: 13:33 Procedural Pain: 0 Post Procedural Pain: 0 Response to Treatment: Procedure was tolerated well Level of Consciousness (Post- Awake and Alert procedure): Post Debridement Measurements of Total Wound Length: (cm) 1.8 Stage: Category/Stage II Width: (cm) 1.5 Depth: (cm) 0.1 Volume: (cm) 0.212 Character of Wound/Ulcer Post Debridement: Improved Post Procedure Diagnosis Same as Pre-procedure Electronic Signature(s) Signed: 09/08/2021 4:01:31 PM By: Fredirick Maudlin MD FACS Signed: 09/08/2021 5:58:04 PM By: Dellie Catholic RN Entered By: Dellie Catholic on 09/08/2021  13:35:59 -------------------------------------------------------------------------------- Debridement Details Patient Name: Date of Service: Gregory [name], [name] 09/08/2021 12:30 PM Medical Record Number: [id number] Patient Account Number: [id number] Date of Birth/Sex: Treating RN: May 09, 1920 (88 y.o. [name] Primary Care Provider: Wylene Simmer Other Clinician: Referring Provider: Treating Provider/Extender: Roxy Cedar in Treatment: 18 Debridement Performed for Assessment: Wound #7 Left,Lateral Foot Performed By: Physician Fredirick Maudlin, MD Debridement Type: Debridement Level of Consciousness (Pre-procedure): Awake and Alert Pre-procedure Verification/Time Out Yes - 13:30 Taken: Start Time: 13:30 Pain Control: Lidocaine 5% topical ointment T Area Debrided (L x W): otal 0.7 (cm) x 0.6 (cm) = 0.42 (cm) Tissue and other material debrided: Non-Viable, Eschar Level: Non-Viable Tissue Debridement Description: Selective/Open Wound Instrument: Curette Bleeding: Minimum Hemostasis Achieved: Pressure End Time: 13:33 Procedural Pain: 0 Post Procedural Pain: 0 Response to Treatment: Procedure was tolerated well Level of Consciousness (Post- Awake and Alert procedure): Post Debridement Measurements of Total Wound Length: (cm) 0.7 Stage: Category/Stage II Width: (cm) 0.6 Depth: (cm) 0.1 Volume: (cm) 0.033 Character of Wound/Ulcer Post Debridement: Improved Post Procedure Diagnosis Same as Pre-procedure Electronic Signature(s) Signed: 09/08/2021 4:01:31 PM By: Fredirick Maudlin  MD FACS Signed: 09/08/2021 5:58:04 PM By: Dellie Catholic RN Signed: 09/08/2021 5:58:04 PM By: Dellie Catholic RN Entered By: Dellie Catholic on 09/08/2021 13:39:19 -------------------------------------------------------------------------------- HPI Details Patient Name: Date of Service: Gregory [name], [name] 09/08/2021 12:30 PM Medical Record Number:  [id number] Patient Account Number: [id number] Date of Birth/Sex: Treating RN: Jul 04, 1920 (88 y.o. [name] Primary Care Provider: Wylene Simmer Other Clinician: Referring Provider: Treating Provider/Extender: Roxy Cedar in Treatment: 18 History of Present Illness HPI Description: ADMISSION 05/05/2021 This is an extremely frail 88 year old man who presents to clinic today accompanied by his son for evaluation of a pressure ulcer on his left heel. He also has dry gangrene on the tips of his bilateral great toes and the lateral aspect of both feet. It sounds like after a hospitalization for a GI bleed, he had a blanket on his feet which ultimately resulted in a pressure injury. He has severe dysphagia and is dependent on G-tube feeding. The wound on his heel has been present since January. He has 24-hour care and they have been applying Xeroform to the heel. He apparently has some sort of air boot that he is meant to wear in bed at night. He has severe peripheral arterial disease as evidenced by the ABIs obtained by Dr. Rachelle Hora and copied here: ABI Findings: +---------+------------------+-----+----------+--------+ Right Rt Pressure (mmHg)IndexWaveform Comment  +---------+------------------+-----+----------+--------+ Brachial 136     +---------+------------------+-----+----------+--------+ PTA >255 [place] monophasic  +---------+------------------+-----+----------+--------+ DP >255 [place] monophasic  +---------+------------------+-----+----------+--------+ Great T oe24 0.18    +---------+------------------+-----+----------+--------+ +---------+------------------+-----+----------+-------+ Left Lt Pressure (mmHg)IndexWaveform Comment +---------+------------------+-----+----------+-------+ Brachial 136     +---------+------------------+-----+----------+-------+ PTA >255 [place] monophasic   +---------+------------------+-----+----------+-------+ DP >255 [place] monophasic  +---------+------------------+-----+----------+-------+ Great T oe0 0.00 Absent   +---------+------------------+-----+----------+-------+ +-------+-----------+-----------+------------+------------+ ABI/TBIT oday's ABIT oday's TBIPrevious ABIPrevious TBI +-------+-----------+-----------+------------+------------+ Right [place] 0.18    +-------+-----------+-----------+------------+------------+ Left [place] 0.00    +-------+-----------+-----------+------------+------------+ Summary: Right: Resting right ankle-brachial index indicates noncompressible right lower extremity arteries. The right toe-brachial index is abnormal. Left: Resting left ankle-brachial index indicates noncompressible left lower extremity arteries. Absent left great toe PPG waveform. He saw Dr. Rachelle Hora on March 16 and at that time, he was felt to be far too frail to undergo any sort of intervention including an aortogram. Certainly not a surgical candidate. According to the electronic medical record, Dr. Rachelle Hora is planning to see him back in a couple of weeks and Gregory determine if he has made any significant improvement in his overall status and might be suitable for an intervention. I read his note to suggest that the patient has had multiple complications from other procedures and that even an aortogram may be too risky. 05/12/2021: Rather remarkably, the eschar and dry gangrene on his bilateral great toes and bilateral fifth metatarsals actually looks better. Some of the eschar on his toes is peeling away to reveal healing tissue underneath. This is also the case with his lateral foot bilaterally. The heel is clean and superficial, with minimal slough. 06/07/2021: Since our last visit, Mr. Mounger has been in the hospital quite a bit. While in the hospital, they were painting his lateral feet and great toes  with Betadine. The wound on his heel is nearly closed with just a sliver of open skin. They have been floating his heels as directed and his home health nurse recently ordered a device to keep his blankets from rubbing on his toes. They have continued using the Betadine on his lateral feet and great toes and the Prisma silver collagen on  his left heel. He does have a bit of callus buildup on his right Achilles tendon area suggesting there is some pressure being applied. 06/15/2021: The left heel wound is closed. There is some slough buildup in the right lateral foot wound. Both great toe wounds have a layer of thick dark eschar, but they appear to have some viable tissue underneath this. He is complaining of pain in his right heel; he had a wound there at one time. On inspection, there is no open skin at this site. 06/23/2021: The left heel remains closed. The right heel never has opened. Both great toe wounds continue to have a layer of thick dark eschar, but it is softer today. The right lateral foot wound also has eschar overlying the surface which has some accumulated slough. He also has areas on his great toes bilaterally that look like they have been rubbing on the slippers that he wears all day. 07/06/2021: The right heel has a new callus on it. It peeled off as I was examining the site and there is a small open wound in that area. The right great toe looks worse today; there are more darkened areas of eschar and the toe itself feels a bit boggy. The right lateral foot is small with just a small amount of slough. The left great toe is in reasonable condition with just a layer of fairly soft eschar at the tip. According to the patient's family, he has been insisting upon blankets being wrapped over his feet at night and he does not cooperate with heel flotation. He also required a 2 unit blood transfusion last week for fairly significant anemia. 07/13/2021: Both heels are closed. The right lateral  foot wound is small, but about the same size as last week. There is some accumulated slough within the wound. The right great toe looks better today; it is no longer boggy and the Betadine paint has dried up the areas that were concerning to me. The left great toe looks to be responding nicely to the Iodosorb dressing. 07/21/2021: The heels remain closed. The right lateral foot wound is about the same size as last week, but he is complaining of more pain in the area and there is some perimeter erythema. The right great toe is looking much better today; the Betadine paint continues to keep the areas of concern dry. The left great toe is doing well with a nice wound surface underneath the crusted-on Iodosorb. 07/28/2021: The right lateral foot wound remains basically unchanged with some overlying eschar, underneath which there is a very small amount of slough. The right great toe is looking worse today. It is red and warm and swollen. There is what looks like a blood blister underneath the eschar on the medial aspect of the toe. The distal tip, however, does not look too bad. The left great toe wound is smaller. 08/07/2021: The right lateral foot wound has closed in quite a bit and is down to just a pinhole. The right great toe continues to be red and inflamed. He did not tolerate Bactrim and so he has been taking levofloxacin. I do not see that is making a tremendous difference in the overall appearance of the toe, but the wounds themselves look a little bit better. The medial aspect has some eschar accumulation and the tip has some slough buildup. The left great toe has some slough accumulation, but overall looks better than the right. He has not been compliant with the recommendation to keep blankets  from resting on the tops of his toes; he says his feet get cold at night. 08/31/2021: In the interim since his last visit, the patient was hospitalized with a CHF exacerbation. In that time, the ulcer on his  right heel has reopened; he has a new ulcer on his left lateral foot at the level of the fifth metatarsal head, the ulcer on his right lateral foot at the fifth metatarsal head has enlarged and deepened. The tips of his great toes look a little bit more robust, but the medial wound on the right great toe has accumulated a lot of slough and is larger. He did undergo removal of his right great toenail shortly after we saw him last. He has multiple pairs of Prevalon boots but does not wear them. 09/08/2021: After his last visit here, the patient was hospitalized at [facility] secondary to bright red blood per rectum and displaced G-tube. Those issues have since been resolved. They applied Mepilex dressings to his wounds while he was there. T oday, the right heel ulcer has healed. The left lateral foot wound is very superficial with just a light layer of eschar. The right great toe wound is smaller but still has slough accumulation and some nonviable subcu tissue. The right great toe has a blister in the interspace between the first and second toe. This was unroofed and the underlying skin did not appear to be open. The medial great toe ulcer has nonviable tissue and eschar present, as does the lateral right foot wound. The tip of the great toe on the right has a small wound with slough accumulation. Electronic Signature(s) Signed: 09/08/2021 2:29:32 PM By: Fredirick Maudlin MD FACS Entered By: Fredirick Maudlin on 09/08/2021 14:29:32 -------------------------------------------------------------------------------- Physical Exam Details Patient Name: Date of Service: Gregory [name], [name] 09/08/2021 12:30 PM Medical Record Number: [id number] Patient Account Number: [id number] Date of Birth/Sex: Treating RN: Nov 11, 1920 (88 y.o. [name] Primary Care Provider: Wylene Simmer Other Clinician: Referring Provider: Treating Provider/Extender: Roxy Cedar in Treatment:  18 Constitutional . . . . No acute distress.Marland Kitchen Respiratory Normal work of breathing on room air.. Notes 09/08/2021: T oday, the right heel ulcer has healed. The left lateral foot wound is very superficial with just a light layer of eschar. The right great toe wound is smaller but still has slough accumulation and some nonviable subcu tissue. The right great toe has a blister in the interspace between the first and second toe. This was unroofed and the underlying skin did not appear to be open. The medial great toe ulcer has nonviable tissue and eschar present, as does the lateral right foot wound. The tip of the great toe on the right has a small wound with slough accumulation. Electronic Signature(s) Signed: 09/08/2021 2:30:12 PM By: Fredirick Maudlin MD FACS Entered By: Fredirick Maudlin on 09/08/2021 14:30:12 -------------------------------------------------------------------------------- Physician Orders Details Patient Name: Date of Service: Gregory [name], [name] 09/08/2021 12:30 PM Medical Record Number: [id number] Patient Account Number: [id number] Date of Birth/Sex: Treating RN: 1920-05-31 (88 y.o. [name] Primary Care Provider: Wylene Simmer Other Clinician: Referring Provider: Treating Provider/Extender: Roxy Cedar in Treatment: [phone] Verbal / Phone Orders: No Diagnosis Coding ICD-10 Coding Code Description I70.263 Atherosclerosis of native arteries of extremities with gangrene, bilateral legs I73.9 Peripheral vascular disease, unspecified E43 Unspecified severe protein-calorie malnutrition N18.31 Chronic kidney disease, stage 3a R13.10 Dysphagia, unspecified L97.512 Non-pressure chronic ulcer of other part of right foot with fat layer exposed  O96.295 Non-pressure chronic ulcer of other part of left foot with fat layer exposed Follow-up Appointments ppointment in 2 weeks. - Dr Celine Ahr Room 3 Friday August 11th at 12:30pm Return  A Bathing/ Shower/ Hygiene May shower and wash wound with soap and water. Edema Control - Lymphedema / SCD / Other Elevate legs to the level of the heart or above for 30 minutes daily and/or when sitting, a frequency of: - throughout the day Avoid standing for long periods of time. Non Wound Condition Bilateral Lower Extremities Protect area with: - Right and Left heels protect with Heel Cups Other Non Wound Condition Orders/Instructions: - Bilateral Heels- heel cups + gauze Home Health No change in wound care orders this week; continue Home Health for wound care. May utilize formulary equivalent dressing for wound treatment orders unless otherwise specified. Other Home Health Orders/Instructions: - [facility] Wound Treatment Wound #1 - T Great oe Wound Laterality: Right Cleanser: Soap and Water 1 x Per Day/30 Days Discharge Instructions: May shower and wash wound with dial antibacterial soap and water prior to dressing change. Cleanser: Wound Cleanser 1 x Per Day/30 Days Discharge Instructions: Cleanse the wound with wound cleanser or normal saline prior to applying a clean dressing using gauze sponges, not tissue or cotton balls. Topical: Gentamicin 1 x Per Day/30 Days Discharge Instructions: As directed by physician Prim Dressing: KerraCel Ag Gelling Fiber Dressing, 2x2 in (silver alginate) 1 x Per Day/30 Days ary Discharge Instructions: Apply silver alginate to wound bed as instructed Secondary Dressing: Woven Gauze Sponges 2x2 in ([place]) 1 x Per Day/30 Days Discharge Instructions: Apply over primary dressing as directed. Secured With: Child psychotherapist, Sterile 2x75 (in/in) (Home Health) 1 x Per Day/30 Days Discharge Instructions: Secure with stretch gauze as directed. Wound #2 - T Great oe Wound Laterality: Left Cleanser: Soap and Water 1 x Per Day/30 Days Discharge Instructions: May shower and wash wound with dial antibacterial soap and water prior to  dressing change. Cleanser: Wound Cleanser 1 x Per Day/30 Days Discharge Instructions: Cleanse the wound with wound cleanser or normal saline prior to applying a clean dressing using gauze sponges, not tissue or cotton balls. Topical: Gentamicin 1 x Per Day/30 Days Discharge Instructions: As directed by physician Prim Dressing: KerraCel Ag Gelling Fiber Dressing, 2x2 in (silver alginate) 1 x Per Day/30 Days ary Discharge Instructions: Apply silver alginate to wound bed as instructed Secondary Dressing: Woven Gauze Sponges 2x2 in ([place]) 1 x Per Day/30 Days Discharge Instructions: Apply over primary dressing as directed. Secured With: Child psychotherapist, Sterile 2x75 (in/in) (Home Health) 1 x Per Day/30 Days Discharge Instructions: Secure with stretch gauze as directed. Wound #4 - Foot Wound Laterality: Right, Lateral Cleanser: Soap and Water 1 x Per Day/30 Days Discharge Instructions: May shower and wash wound with dial antibacterial soap and water prior to dressing change. Cleanser: Wound Cleanser 1 x Per Day/30 Days Discharge Instructions: Cleanse the wound with wound cleanser or normal saline prior to applying a clean dressing using gauze sponges, not tissue or cotton balls. Topical: Gentamicin 1 x Per Day/30 Days Discharge Instructions: As directed by physician Prim Dressing: KerraCel Ag Gelling Fiber Dressing, 2x2 in (silver alginate) 1 x Per Day/30 Days ary Discharge Instructions: Apply silver alginate to wound bed as instructed Secondary Dressing: ALLEVYN Gentle Border, 3x3 (in/in) 1 x Per Day/30 Days Discharge Instructions: Apply over primary dressing as directed. Secondary Dressing: Woven Gauze Sponges 2x2 in (Home Health) 1 x Per Day/30 Days  Discharge Instructions: Apply over primary dressing as directed. Add-Ons: Mepilex Plus 1 x Per Day/30 Days Discharge Instructions: if available Wound #5 - T Great oe Wound Laterality: Right, Medial Cleanser: Soap and  Water 1 x Per Day/30 Days Discharge Instructions: May shower and wash wound with dial antibacterial soap and water prior to dressing change. Cleanser: Wound Cleanser 1 x Per Day/30 Days Discharge Instructions: Cleanse the wound with wound cleanser or normal saline prior to applying a clean dressing using gauze sponges, not tissue or cotton balls. Topical: Gentamicin 1 x Per Day/30 Days Discharge Instructions: As directed by physician Prim Dressing: KerraCel Ag Gelling Fiber Dressing, 2x2 in (silver alginate) 1 x Per Day/30 Days ary Discharge Instructions: Apply silver alginate to wound bed as instructed Secondary Dressing: Woven Gauze Sponges 2x2 in ([place]) 1 x Per Day/30 Days Discharge Instructions: Apply over primary dressing as directed. Secured With: Child psychotherapist, Sterile 2x75 (in/in) (Home Health) 1 x Per Day/30 Days Discharge Instructions: Secure with stretch gauze as directed. Wound #6 - Calcaneus Wound Laterality: Right Cleanser: Soap and Water 1 x Per Day/30 Days Discharge Instructions: May shower and wash wound with dial antibacterial soap and water prior to dressing change. Cleanser: Wound Cleanser 1 x Per Day/30 Days Discharge Instructions: Cleanse the wound with wound cleanser or normal saline prior to applying a clean dressing using gauze sponges, not tissue or cotton balls. Topical: Gentamicin 1 x Per Day/30 Days Discharge Instructions: As directed by physician Prim Dressing: KerraCel Ag Gelling Fiber Dressing, 2x2 in (silver alginate) 1 x Per Day/30 Days ary Discharge Instructions: Apply silver alginate to wound bed as instructed Secondary Dressing: ALLEVYN Gentle Border, 3x3 (in/in) 1 x Per Day/30 Days Discharge Instructions: Apply over primary dressing as directed. Secondary Dressing: ALLEVYN Heel 4 1/2in x 5 1/2in / 10.5cm x 13.5cm 1 x Per Day/30 Days Discharge Instructions: Apply over primary dressing as directed. Secondary Dressing: Woven  Gauze Sponges 2x2 in (Home Health) 1 x Per Day/30 Days Discharge Instructions: Apply over primary dressing as directed. Wound #7 - Foot Wound Laterality: Left, Lateral Cleanser: Soap and Water 1 x Per Day/30 Days Discharge Instructions: May shower and wash wound with dial antibacterial soap and water prior to dressing change. Cleanser: Wound Cleanser 1 x Per Day/30 Days Discharge Instructions: Cleanse the wound with wound cleanser or normal saline prior to applying a clean dressing using gauze sponges, not tissue or cotton balls. Topical: Gentamicin 1 x Per Day/30 Days Discharge Instructions: As directed by physician Prim Dressing: KerraCel Ag Gelling Fiber Dressing, 2x2 in (silver alginate) 1 x Per Day/30 Days ary Discharge Instructions: Apply silver alginate to wound bed as instructed Secondary Dressing: ALLEVYN Gentle Border, 4x4 (in/in) 1 x Per Day/30 Days Discharge Instructions: Apply over primary dressing as directed. Secondary Dressing: Woven Gauze Sponges 2x2 in (Home Health) 1 x Per Day/30 Days Discharge Instructions: Apply over primary dressing as directed. Add-Ons: Mepilex plus 1 x Per Day/30 Days Discharge Instructions: if available Electronic Signature(s) Signed: 09/08/2021 4:01:31 PM By: Fredirick Maudlin MD FACS Entered By: Fredirick Maudlin on 09/08/2021 14:30:32 -------------------------------------------------------------------------------- Problem List Details Patient Name: Date of Service: Gregory [name], [name] 09/08/2021 12:30 PM Medical Record Number: [id number] Patient Account Number: [id number] Date of Birth/Sex: Treating RN: 02-23-1920 (88 y.o. 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, bilateral legs 05/05/2021 No Yes I73.9 Peripheral vascular disease, unspecified 05/05/2021 No Yes E43 Unspecified severe protein-calorie malnutrition 05/05/2021 No Yes N18.31 Chronic kidney disease, stage 3a 05/05/2021 No Yes R13.10 Dysphagia, unspecified 05/05/2021 No Yes L97.512 Non-pressure chronic ulcer of other part of right foot with fat layer exposed 06/07/2021 No Yes L97.522 Non-pressure chronic ulcer of other part of left foot with fat layer exposed 06/07/2021 No Yes Inactive Problems ICD-10 Code Description Active Date Inactive Date L89.622 Pressure ulcer of left heel, stage 2 05/05/2021 05/05/2021 L89.612 Pressure ulcer of right heel, stage 2 07/06/2021 07/06/2021 Resolved Problems Electronic Signature(s) Signed: 09/08/2021 2:20:51 PM By: Fredirick Maudlin MD FACS Entered By: Fredirick Maudlin on 09/08/2021 14:20:51 -------------------------------------------------------------------------------- Progress Note Details Patient Name: Date of Service: Gregory [name], [name] 09/08/2021 12:30 PM Medical Record Number: [id number] Patient Account Number: [id number] Date of Birth/Sex: Treating RN: [birth year] (88 y.o. [name] Primary Care Provider: Wylene Simmer Other Clinician: Referring Provider: Treating Provider/Extender: Roxy Cedar in Treatment: 18 Subjective Chief Complaint Information obtained from Patient Patient is at the clinic for treatment of an open pressure ulcer on his left heel and dry gangrene of both great toes History of Present Illness (HPI) ADMISSION 05/05/2021 This is an extremely frail 88 year old man who presents to clinic today accompanied by his son for evaluation of a pressure ulcer on his left heel. He also has dry gangrene on the tips of his bilateral great toes and the lateral aspect of both feet. It sounds like after a hospitalization for a GI bleed, he had a blanket on his feet which ultimately resulted in a  pressure injury. He has severe dysphagia and is dependent on G-tube feeding. The wound on his heel has been present since January. He has 24-hour care and they have been applying Xeroform to the heel. He apparently has some sort of air boot that he is meant to wear in bed at night. He has severe peripheral arterial disease as evidenced by the ABIs obtained by Dr. Rachelle Hora and copied here: ABI Findings: +---------+------------------+-----+----------+--------+ Right Rt Pressure (mmHg)IndexWaveform Comment  +---------+------------------+-----+----------+--------+ Brachial 136    +---------+------------------+-----+----------+--------+ PTA >255 [place] monophasic  +---------+------------------+-----+----------+--------+ DP >255 [place] monophasic  +---------+------------------+-----+----------+--------+ Great T oe24 0.18    +---------+------------------+-----+----------+--------+ +---------+------------------+-----+----------+-------+ Left Lt Pressure (mmHg)IndexWaveform Comment +---------+------------------+-----+----------+-------+ Brachial 136    +---------+------------------+-----+----------+-------+ PTA >255 [place] monophasic  +---------+------------------+-----+----------+-------+ DP >255 [place] monophasic  +---------+------------------+-----+----------+-------+ Great T oe0 0.00 Absent   +---------+------------------+-----+----------+-------+ +-------+-----------+-----------+------------+------------+ ABI/TBIT oday's ABIT oday's TBIPrevious ABIPrevious TBI +-------+-----------+-----------+------------+------------+ Right [place] 0.18    +-------+-----------+-----------+------------+------------+ Left Cobb 0.00    +-------+-----------+-----------+------------+------------+ Summary: Right: Resting right ankle-brachial index indicates noncompressible right lower extremity arteries. The right toe-brachial index is abnormal. Left:  Resting left ankle-brachial index indicates noncompressible left lower extremity arteries. Absent left great toe PPG waveform. He saw Dr. Rachelle Hora on March 16 and at that time, he was felt to be far too frail to undergo any sort of intervention including an aortogram. Certainly not a surgical candidate. According to the electronic medical record, Dr. Rachelle Hora is planning to see him back in a couple of weeks and Gregory determine if he has made any significant improvement in his overall status and might be suitable for an intervention. I read his note to suggest that the patient has had multiple complications from other procedures and that even an aortogram may be too risky. 05/12/2021: Rather remarkably, the eschar and dry gangrene on his bilateral great toes and bilateral fifth metatarsals actually  looks better. Some of the eschar on his toes is peeling away to reveal healing tissue underneath. This is also the case with his lateral foot bilaterally. The heel is clean and superficial, with minimal slough. 06/07/2021: Since our last visit, Mr. Klinke has been in the hospital quite a bit. While in the hospital, they were painting his lateral feet and great toes with Betadine. The wound on his heel is nearly closed with just a sliver of open skin. They have been floating his heels as directed and his home health nurse recently ordered a device to keep his blankets from rubbing on his toes. They have continued using the Betadine on his lateral feet and great toes and the Prisma silver collagen on his left heel. He does have a bit of callus buildup on his right Achilles tendon area suggesting there is some pressure being applied. 06/15/2021: The left heel wound is closed. There is some slough buildup in the right lateral foot wound. Both great toe wounds have a layer of thick dark eschar, but they appear to have some viable tissue underneath this. He is complaining of pain in his right heel; he had a wound  there at one time. On inspection, there is no open skin at this site. 06/23/2021: The left heel remains closed. The right heel never has opened. Both great toe wounds continue to have a layer of thick dark eschar, but it is softer today. The right lateral foot wound also has eschar overlying the surface which has some accumulated slough. He also has areas on his great toes bilaterally that look like they have been rubbing on the slippers that he wears all day. 07/06/2021: The right heel has a new callus on it. It peeled off as I was examining the site and there is a small open wound in that area. The right great toe looks worse today; there are more darkened areas of eschar and the toe itself feels a bit boggy. The right lateral foot is small with just a small amount of slough. The left great toe is in reasonable condition with just a layer of fairly soft eschar at the tip. According to the patient's family, he has been insisting upon blankets being wrapped over his feet at night and he does not cooperate with heel flotation. He also required a 2 unit blood transfusion last week for fairly significant anemia. 07/13/2021: Both heels are closed. The right lateral foot wound is small, but about the same size as last week. There is some accumulated slough within the wound. The right great toe looks better today; it is no longer boggy and the Betadine paint has dried up the areas that were concerning to me. The left great toe looks to be responding nicely to the Iodosorb dressing. 07/21/2021: The heels remain closed. The right lateral foot wound is about the same size as last week, but he is complaining of more pain in the area and there is some perimeter erythema. The right great toe is looking much better today; the Betadine paint continues to keep the areas of concern dry. The left great toe is doing well with a nice wound surface underneath the crusted-on Iodosorb. 07/28/2021: The right lateral foot wound  remains basically unchanged with some overlying eschar, underneath which there is a very small amount of slough. The right great toe is looking worse today. It is red and warm and swollen. There is what looks like a blood blister underneath the eschar on the medial  aspect of the toe. The distal tip, however, does not look too bad. The left great toe wound is smaller. 08/07/2021: The right lateral foot wound has closed in quite a bit and is down to just a pinhole. The right great toe continues to be red and inflamed. He did not tolerate Bactrim and so he has been taking levofloxacin. I do not see that is making a tremendous difference in the overall appearance of the toe, but the wounds themselves look a little bit better. The medial aspect has some eschar accumulation and the tip has some slough buildup. The left great toe has some slough accumulation, but overall looks better than the right. He has not been compliant with the recommendation to keep blankets from resting on the tops of his toes; he says his feet get cold at night. 08/31/2021: In the interim since his last visit, the patient was hospitalized with a CHF exacerbation. In that time, the ulcer on his right heel has reopened; he has a new ulcer on his left lateral foot at the level of the fifth metatarsal head, the ulcer on his right lateral foot at the fifth metatarsal head has enlarged and deepened. The tips of his great toes look a little bit more robust, but the medial wound on the right great toe has accumulated a lot of slough and is larger. He did undergo removal of his right great toenail shortly after we saw him last. He has multiple pairs of Prevalon boots but does not wear them. 09/08/2021: After his last visit here, the patient was hospitalized at [facility] secondary to bright red blood per rectum and displaced G-tube. Those issues have since been resolved. They applied Mepilex dressings to his wounds while he was there. T oday, the right  heel ulcer has healed. The left lateral foot wound is very superficial with just a light layer of eschar. The right great toe wound is smaller but still has slough accumulation and some nonviable subcu tissue. The right great toe has a blister in the interspace between the first and second toe. This was unroofed and the underlying skin did not appear to be open. The medial great toe ulcer has nonviable tissue and eschar present, as does the lateral right foot wound. The tip of the great toe on the right has a small wound with slough accumulation. Patient History Information obtained from Patient, Caregiver. Family History Unknown History. Social History Never smoker, Marital Status - Widowed, Alcohol Use - Never, Drug Use - No History, Caffeine Use - Never. Medical History Cardiovascular Patient has history of Arrhythmia - A-Fib, Hypertension Medical A Surgical History Notes nd Cardiovascular Pacemaker, Complete heart block Gastrointestinal Dysphagia, PEG tube in place Endocrine Hypothyroidism Genitourinary CKD-3 Neurologic Neuromuscular disorder Objective Constitutional No acute distress.. Vitals Time Taken: 2:42 PM, Temperature: 97.7 F, Pulse: 75 bpm, Respiratory Rate: 18 breaths/min, Blood Pressure: 136/71 mmHg. Respiratory Normal work of breathing on room air.. General Notes: 09/08/2021: T oday, the right heel ulcer has healed. The left lateral foot wound is very superficial with just a light layer of eschar. The right great toe wound is smaller but still has slough accumulation and some nonviable subcu tissue. The right great toe has a blister in the interspace between the first and second toe. This was unroofed and the underlying skin did not appear to be open. The medial great toe ulcer has nonviable tissue and eschar present, as does the lateral right foot wound. The tip of the great toe on  the right has a small wound with slough accumulation. Integumentary (Hair,  Skin) Wound #1 status is Open. Original cause of wound was Pressure Injury. The date acquired was: 02/12/2021. The wound has been in treatment 18 weeks. The wound is located on the Right T Great. The wound measures 0.4cm length x 0.7cm width x 0.1cm depth; 0.22cm^2 area and 0.022cm^3 volume. There is Fat oe Layer (Subcutaneous Tissue) exposed. There is no tunneling or undermining noted. There is a medium amount of serosanguineous drainage noted. The wound margin is flat and intact. There is small (1-33%) red granulation within the wound bed. There is a large (67-100%) amount of necrotic tissue within the wound bed including Eschar and Adherent Slough. Wound #2 status is Open. Original cause of wound was Pressure Injury. The date acquired was: 02/12/2021. The wound has been in treatment 18 weeks. The wound is located on the Left T Great. The wound measures 0.4cm length x 0.6cm width x 0.1cm depth; 0.188cm^2 area and 0.019cm^3 volume. There is Fat oe Layer (Subcutaneous Tissue) exposed. There is no tunneling or undermining noted. There is a small amount of serosanguineous drainage noted. The wound margin is flat and intact. There is no granulation within the wound bed. There is a large (67-100%) amount of necrotic tissue within the wound bed including Eschar and Adherent Slough. Wound #4 status is Open. Original cause of wound was Gradually Appeared. The date acquired was: 04/12/2021. The wound has been in treatment 13 weeks. The wound is located on the Right,Lateral Foot. The wound measures 1.2cm length x 1cm width x 0.1cm depth; 0.942cm^2 area and 0.094cm^3 volume. There is Fat Layer (Subcutaneous Tissue) exposed. There is no tunneling or undermining noted. There is a small amount of serosanguineous drainage noted. The wound margin is flat and intact. There is small (1-33%) pink granulation within the wound bed. There is a large (67-100%) amount of necrotic tissue within the wound bed including Eschar and  Adherent Slough. Wound #5 status is Open. Original cause of wound was Blister. The date acquired was: 06/12/2021. The wound has been in treatment 11 weeks. The wound is located on the Right,Medial T Great. The wound measures 1.8cm length x 1.5cm width x 0.1cm depth; 2.121cm^2 area and 0.212cm^3 volume. There is Fat oe Layer (Subcutaneous Tissue) exposed. There is no tunneling or undermining noted. There is a medium amount of serosanguineous drainage noted. There is small (1-33%) red granulation within the wound bed. There is a large (67-100%) amount of necrotic tissue within the wound bed including Eschar and Adherent Slough. Wound #6 status is Open. Original cause of wound was Pressure Injury. The date acquired was: 06/24/2021. The wound has been in treatment 9 weeks. The wound is located on the Right Calcaneus. The wound measures 0.1cm length x 0.1cm width x 0.1cm depth; 0.008cm^2 area and 0.001cm^3 volume. There is Fat Layer (Subcutaneous Tissue) exposed. There is no tunneling or undermining noted. There is a none present amount of drainage noted. There is no granulation within the wound bed. There is a large (67-100%) amount of necrotic tissue within the wound bed including Eschar. Wound #7 status is Open. Original cause of wound was Gradually Appeared. The date acquired was: 08/31/2021. The wound has been in treatment 1 weeks. The wound is located on the Left,Lateral Foot. The wound measures 0.7cm length x 0.6cm width x 0.1cm depth; 0.33cm^2 area and 0.033cm^3 volume. There is Fat Layer (Subcutaneous Tissue) exposed. There is no tunneling or undermining noted. There is a medium  amount of serosanguineous drainage noted. There is small (1-33%) pink granulation within the wound bed. There is a large (67-100%) amount of necrotic tissue within the wound bed including Eschar and Adherent Slough. Assessment Active Problems ICD-10 Atherosclerosis of native arteries of extremities with gangrene, bilateral  legs Peripheral vascular disease, unspecified Unspecified severe protein-calorie malnutrition Chronic kidney disease, stage 3a Dysphagia, unspecified Non-pressure chronic ulcer of other part of right foot with fat layer exposed Non-pressure chronic ulcer of other part of left foot with fat layer exposed Procedures Wound #1 Pre-procedure diagnosis of Wound #1 is an Arterial Insufficiency Ulcer located on the Right T Great .Severity of Tissue Pre Debridement is: Fat layer oe exposed. There was a Excisional Skin/Subcutaneous Tissue Debridement with a total area of 0.28 sq cm performed by Fredirick Maudlin, MD. With the following instrument(s): Curette to remove Non-Viable tissue/material. Material removed includes Subcutaneous Tissue and Slough and after achieving pain control using Lidocaine 5% topical ointment. No specimens were taken. A time out was conducted at 13:30, prior to the start of the procedure. A Minimum amount of bleeding was controlled with Pressure. The procedure was tolerated well with a pain level of 0 throughout and a pain level of 0 following the procedure. Post Debridement Measurements: 0.4cm length x 0.7cm width x 0.1cm depth; 0.022cm^3 volume. Character of Wound/Ulcer Post Debridement is improved. Severity of Tissue Post Debridement is: Fat layer exposed. Post procedure Diagnosis Wound #1: Same as Pre-Procedure Wound #2 Pre-procedure diagnosis of Wound #2 is an Arterial Insufficiency Ulcer located on the Left T Great .Severity of Tissue Pre Debridement is: Fat layer oe exposed. There was a Excisional Skin/Subcutaneous Tissue Debridement with a total area of 0.24 sq cm performed by Fredirick Maudlin, MD. With the following instrument(s): Curette to remove Non-Viable tissue/material. Material removed includes Subcutaneous Tissue and Slough and after achieving pain control using Lidocaine 5% topical ointment. No specimens were taken. A time out was conducted at 13:30, prior to  the start of the procedure. A Minimum amount of bleeding was controlled with Pressure. The procedure was tolerated well with a pain level of 0 throughout and a pain level of 0 following the procedure. Post Debridement Measurements: 0.4cm length x 0.6cm width x 0.1cm depth; 0.019cm^3 volume. Character of Wound/Ulcer Post Debridement is improved. Severity of Tissue Post Debridement is: Fat layer exposed. Post procedure Diagnosis Wound #2: Same as Pre-Procedure Wound #4 Pre-procedure diagnosis of Wound #4 is a Pressure Ulcer located on the Right,Lateral Foot . There was a Excisional Skin/Subcutaneous Tissue Debridement with a total area of 1.2 sq cm performed by Fredirick Maudlin, MD. With the following instrument(s): Curette to remove Non-Viable tissue/material. Material removed includes Subcutaneous Tissue after achieving pain control using Lidocaine 5% topical ointment. No specimens were taken. A time out was conducted at 13:30, prior to the start of the procedure. A Minimum amount of bleeding was controlled with Pressure. The procedure was tolerated well with a pain level of 0 throughout and a pain level of 0 following the procedure. Post Debridement Measurements: 1.2cm length x 1cm width x 0.1cm depth; 0.094cm^3 volume. Post debridement Stage noted as Category/Stage II. Character of Wound/Ulcer Post Debridement is improved. Post procedure Diagnosis Wound #4: Same as Pre-Procedure Wound #5 Pre-procedure diagnosis of Wound #5 is a Pressure Ulcer located on the Right,Medial T Great . There was a Excisional Skin/Subcutaneous Tissue oe Debridement with a total area of 2.7 sq cm performed by Fredirick Maudlin, MD. With the following instrument(s): Curette to remove Non-Viable tissue/material.  Material removed includes Subcutaneous Tissue and Slough and after achieving pain control using Lidocaine 5% topical ointment. No specimens were taken. A time out was conducted at 13:30, prior to the start of the  procedure. A Minimum amount of bleeding was controlled with Pressure. The procedure was tolerated well with a pain level of 0 throughout and a pain level of 0 following the procedure. Post Debridement Measurements: 1.8cm length x 1.5cm width x 0.1cm depth; 0.212cm^3 volume. Post debridement Stage noted as Category/Stage II. Character of Wound/Ulcer Post Debridement is improved. Post procedure Diagnosis Wound #5: Same as Pre-Procedure Wound #6 Pre-procedure diagnosis of Wound #6 is a Pressure Ulcer located on the Right Calcaneus . There was a Excisional Skin/Subcutaneous Tissue Debridement with a total area of 0.01 sq cm performed by Fredirick Maudlin, MD. With the following instrument(s): Curette to remove Non-Viable tissue/material. Material removed includes Subcutaneous Tissue after achieving pain control using Lidocaine 5% topical ointment. No specimens were taken. A time out was conducted at 13:30, prior to the start of the procedure. A Minimum amount of bleeding was controlled with Pressure. The procedure was tolerated well with a pain level of 0 throughout and a pain level of 0 following the procedure. Post Debridement Measurements: 0.1cm length x 0.1cm width x 0.1cm depth; 0.001cm^3 volume. Post debridement Stage noted as Category/Stage II. Character of Wound/Ulcer Post Debridement is improved. Post procedure Diagnosis Wound #6: Same as Pre-Procedure Wound #7 Pre-procedure diagnosis of Wound #7 is a Pressure Ulcer located on the Left,Lateral Foot . There was a Selective/Open Wound Non-Viable Tissue Debridement with a total area of 0.42 sq cm performed by Fredirick Maudlin, MD. With the following instrument(s): Curette to remove Non-Viable tissue/material. Material removed includes Eschar after achieving pain control using Lidocaine 5% topical ointment. No specimens were taken. A time out was conducted at 13:30, prior to the start of the procedure. A Minimum amount of bleeding was controlled  with Pressure. The procedure was tolerated well with a pain level of 0 throughout and a pain level of 0 following the procedure. Post Debridement Measurements: 0.7cm length x 0.6cm width x 0.1cm depth; 0.033cm^3 volume. Post debridement Stage noted as Category/Stage II. Character of Wound/Ulcer Post Debridement is improved. Post procedure Diagnosis Wound #7: Same as Pre-Procedure A Paring/cutting 1 benign hyperkeratotic lesion procedure was performed. by Fredirick Maudlin, MD. Post procedure Diagnosis Wound #: Same as Pre-Procedure Plan Follow-up Appointments: Return Appointment in 2 weeks. - Dr Celine Ahr Room 3 Friday August 11th at 12:30pm Bathing/ Shower/ Hygiene: May shower and wash wound with soap and water. Edema Control - Lymphedema / SCD / Other: Elevate legs to the level of the heart or above for 30 minutes daily and/or when sitting, a frequency of: - throughout the day Avoid standing for long periods of time. Non Wound Condition: Protect area with: - Right and Left heels protect with Heel Cups Other Non Wound Condition Orders/Instructions: - Bilateral Heels- heel cups + gauze Home Health: No change in wound care orders this week; continue Home Health for wound care. May utilize formulary equivalent dressing for wound treatment orders unless otherwise specified. Other Home Health Orders/Instructions: - [facility] WOUND #1: - T Great Wound Laterality: Right oe Cleanser: Soap and Water 1 x Per Day/30 Days Discharge Instructions: May shower and wash wound with dial antibacterial soap and water prior to dressing change. Cleanser: Wound Cleanser 1 x Per Day/30 Days Discharge Instructions: Cleanse the wound with wound cleanser or normal saline prior to applying a clean dressing using  gauze sponges, not tissue or cotton balls. Topical: Gentamicin 1 x Per Day/30 Days Discharge Instructions: As directed by physician Prim Dressing: KerraCel Ag Gelling Fiber Dressing, 2x2 in (silver alginate)  1 x Per Day/30 Days ary Discharge Instructions: Apply silver alginate to wound bed as instructed Secondary Dressing: Woven Gauze Sponges 2x2 in ([place]) 1 x Per Day/30 Days Discharge Instructions: Apply over primary dressing as directed. Secured With: Child psychotherapist, Sterile 2x75 (in/in) (Home Health) 1 x Per Day/30 Days Discharge Instructions: Secure with stretch gauze as directed. WOUND #2: - T Great Wound Laterality: Left oe Cleanser: Soap and Water 1 x Per Day/30 Days Discharge Instructions: May shower and wash wound with dial antibacterial soap and water prior to dressing change. Cleanser: Wound Cleanser 1 x Per Day/30 Days Discharge Instructions: Cleanse the wound with wound cleanser or normal saline prior to applying a clean dressing using gauze sponges, not tissue or cotton balls. Topical: Gentamicin 1 x Per Day/30 Days Discharge Instructions: As directed by physician Prim Dressing: KerraCel Ag Gelling Fiber Dressing, 2x2 in (silver alginate) 1 x Per Day/30 Days ary Discharge Instructions: Apply silver alginate to wound bed as instructed Secondary Dressing: Woven Gauze Sponges 2x2 in ([place]) 1 x Per Day/30 Days Discharge Instructions: Apply over primary dressing as directed. Secured With: Child psychotherapist, Sterile 2x75 (in/in) (Home Health) 1 x Per Day/30 Days Discharge Instructions: Secure with stretch gauze as directed. WOUND #4: - Foot Wound Laterality: Right, Lateral Cleanser: Soap and Water 1 x Per Day/30 Days Discharge Instructions: May shower and wash wound with dial antibacterial soap and water prior to dressing change. Cleanser: Wound Cleanser 1 x Per Day/30 Days Discharge Instructions: Cleanse the wound with wound cleanser or normal saline prior to applying a clean dressing using gauze sponges, not tissue or cotton balls. Topical: Gentamicin 1 x Per Day/30 Days Discharge Instructions: As directed by physician Prim Dressing:  KerraCel Ag Gelling Fiber Dressing, 2x2 in (silver alginate) 1 x Per Day/30 Days ary Discharge Instructions: Apply silver alginate to wound bed as instructed Secondary Dressing: ALLEVYN Gentle Border, 3x3 (in/in) 1 x Per Day/30 Days Discharge Instructions: Apply over primary dressing as directed. Secondary Dressing: Woven Gauze Sponges 2x2 in (Home Health) 1 x Per Day/30 Days Discharge Instructions: Apply over primary dressing as directed. Add-Ons: Mepilex Plus 1 x Per Day/30 Days Discharge Instructions: if available WOUND #5: - T Great Wound Laterality: Right, Medial oe Cleanser: Soap and Water 1 x Per Day/30 Days Discharge Instructions: May shower and wash wound with dial antibacterial soap and water prior to dressing change. Cleanser: Wound Cleanser 1 x Per Day/30 Days Discharge Instructions: Cleanse the wound with wound cleanser or normal saline prior to applying a clean dressing using gauze sponges, not tissue or cotton balls. Topical: Gentamicin 1 x Per Day/30 Days Discharge Instructions: As directed by physician Prim Dressing: KerraCel Ag Gelling Fiber Dressing, 2x2 in (silver alginate) 1 x Per Day/30 Days ary Discharge Instructions: Apply silver alginate to wound bed as instructed Secondary Dressing: Woven Gauze Sponges 2x2 in ([place]) 1 x Per Day/30 Days Discharge Instructions: Apply over primary dressing as directed. Secured With: Child psychotherapist, Sterile 2x75 (in/in) (Home Health) 1 x Per Day/30 Days Discharge Instructions: Secure with stretch gauze as directed. WOUND #6: - Calcaneus Wound Laterality: Right Cleanser: Soap and Water 1 x Per Day/30 Days Discharge Instructions: May shower and wash wound with dial antibacterial soap and water prior to dressing change. Cleanser: Wound  Cleanser 1 x Per Day/30 Days Discharge Instructions: Cleanse the wound with wound cleanser or normal saline prior to applying a clean dressing using gauze sponges, not tissue or  cotton balls. Topical: Gentamicin 1 x Per Day/30 Days Discharge Instructions: As directed by physician Prim Dressing: KerraCel Ag Gelling Fiber Dressing, 2x2 in (silver alginate) 1 x Per Day/30 Days ary Discharge Instructions: Apply silver alginate to wound bed as instructed Secondary Dressing: ALLEVYN Gentle Border, 3x3 (in/in) 1 x Per Day/30 Days Discharge Instructions: Apply over primary dressing as directed. Secondary Dressing: ALLEVYN Heel 4 1/2in x 5 1/2in / 10.5cm x 13.5cm 1 x Per Day/30 Days Discharge Instructions: Apply over primary dressing as directed. Secondary Dressing: Woven Gauze Sponges 2x2 in (Home Health) 1 x Per Day/30 Days Discharge Instructions: Apply over primary dressing as directed. WOUND #7: - Foot Wound Laterality: Left, Lateral Cleanser: Soap and Water 1 x Per Day/30 Days Discharge Instructions: May shower and wash wound with dial antibacterial soap and water prior to dressing change. Cleanser: Wound Cleanser 1 x Per Day/30 Days Discharge Instructions: Cleanse the wound with wound cleanser or normal saline prior to applying a clean dressing using gauze sponges, not tissue or cotton balls. Topical: Gentamicin 1 x Per Day/30 Days Discharge Instructions: As directed by physician Prim Dressing: KerraCel Ag Gelling Fiber Dressing, 2x2 in (silver alginate) 1 x Per Day/30 Days ary Discharge Instructions: Apply silver alginate to wound bed as instructed Secondary Dressing: ALLEVYN Gentle Border, 4x4 (in/in) 1 x Per Day/30 Days Discharge Instructions: Apply over primary dressing as directed. Secondary Dressing: Woven Gauze Sponges 2x2 in (Home Health) 1 x Per Day/30 Days Discharge Instructions: Apply over primary dressing as directed. Add-Ons: Mepilex plus 1 x Per Day/30 Days Discharge Instructions: if available 09/08/2021: T oday, the right heel ulcer has healed. The left lateral foot wound is very superficial with just a light layer of eschar. The right great toe wound  is smaller but still has slough accumulation and some nonviable subcu tissue. The right great toe has a blister in the interspace between the first and second toe. This was unroofed and the underlying skin did not appear to be open. The medial great toe ulcer has nonviable tissue and eschar present, as does the lateral right foot wound. The tip of the great toe on the right has a small wound with slough accumulation. I used a curette to debride the eschar off of the left lateral foot wound. I used forceps and scissors to unroofed the great toe blister and excised the dead skin. I used a curette to debride slough and nonviable subcutaneous tissue from both great toe distal wounds and from the medial right great toe wound. I similarly debrided the right lateral foot wound. It is fairly clear that he continues to have too much pressure on his lateral right foot, likely secondary to sitting in his wheelchair. He refuses to wear Prevalon boots. I suggested that the family try to place pillows between his feet and any hard surfaces and float his heels at all times. We Gregory continue using topical gentamicin with silver alginate. He Gregory follow-up in 2 weeks. Electronic Signature(s) Signed: 09/08/2021 2:32:12 PM By: Fredirick Maudlin MD FACS Entered By: Fredirick Maudlin on 09/08/2021 14:32:12 -------------------------------------------------------------------------------- HxROS Details Patient Name: Date of Service: Gregory [name], [name] 09/08/2021 12:30 PM Medical Record Number: [id number] Patient Account Number: [id number] Date of Birth/Sex: Treating RN: [birth year] (88 y.o. [name] Primary Care Provider: Wylene Simmer Other Clinician: Referring Provider:  Treating Provider/Extender: Roxy Cedar in Treatment: 18 Information Obtained From Patient Caregiver Cardiovascular Medical History: Positive for: Arrhythmia - A-Fib; Hypertension Past Medical History  Notes: Pacemaker, Complete heart block Gastrointestinal Medical History: Past Medical History Notes: Dysphagia, PEG tube in place Endocrine Medical History: Past Medical History Notes: Hypothyroidism Genitourinary Medical History: Past Medical History Notes: CKD-3 Neurologic Medical History: Past Medical History Notes: Neuromuscular disorder Immunizations Pneumococcal Vaccine: Received Pneumococcal Vaccination: Yes Received Pneumococcal Vaccination On or After 60th Birthday: No Implantable Devices Yes Family and Social History Unknown History: Yes; Never smoker; Marital Status - Widowed; Alcohol Use: Never; Drug Use: No History; Caffeine Use: Never; Financial Concerns: No; Food, Clothing or Shelter Needs: No; Support System Lacking: No; Transportation Concerns: No Electronic Signature(s) Signed: 09/08/2021 4:01:31 PM By: Fredirick Maudlin MD FACS Signed: 09/08/2021 5:58:04 PM By: Dellie Catholic RN Entered By: Fredirick Maudlin on 09/08/2021 14:29:40 -------------------------------------------------------------------------------- SuperBill Details Patient Name: Date of Service: Gregory [name], [name] 09/08/2021 Medical Record Number: [id number] Patient Account Number: [id number] Date of Birth/Sex: Treating RN: 20-Mar-1920 (88 y.o. [name] Primary Care Provider: Wylene Simmer Other Clinician: Referring Provider: Treating Provider/Extender: Roxy Cedar in Treatment: 18 Diagnosis Coding ICD-10 Codes Code Description [phone] Atherosclerosis of native arteries of extremities with gangrene, bilateral legs I73.9 Peripheral vascular disease, unspecified E43 Unspecified severe protein-calorie malnutrition N18.31 Chronic kidney disease, stage 3a R13.10 Dysphagia, unspecified L97.512 Non-pressure chronic ulcer of other part of right foot with fat layer exposed L97.522 Non-pressure chronic ulcer of other part of left foot with fat layer  exposed Facility Procedures CPT4 Code: [id number] Description: [place] - DEB SUBQ TISSUE 20 SQ CM/< ICD-10 Diagnosis Description L97.512 Non-pressure chronic ulcer of other part of right foot with fat layer exposed L97.522 Non-pressure chronic ulcer of other part of left foot with fat layer exposed Modifier: Quantity: 1 CPT4 Code: [id number] Description: 07680 - DEBRIDE WOUND 1ST 20 SQ CM OR < ICD-10 Diagnosis Description L97.522 Non-pressure chronic ulcer of other part of left foot with fat layer exposed Modifier: Quantity: 1 Physician Procedures : CPT4 Code Description Modifier [id number] - WC PHYS LEVEL 4 - EST PT 25 ICD-10 Diagnosis Description L97.512 Non-pressure chronic ulcer of other part of right foot with fat layer exposed L97.522 Non-pressure chronic ulcer of other part of left foot  with fat layer exposed I70.263 Atherosclerosis of native arteries of extremities with gangrene, bilateral legs E43 Unspecified severe protein-calorie malnutrition [id number] - WC PHYS SUBQ TISS 20 SQ CM 1 ICD-10 Diagnosis Description L97.512  Non-pressure chronic ulcer of other part of right foot with fat layer exposed L97.522 Non-pressure chronic ulcer of other part of left foot with fat layer exposed Quantity: 1 : [id number] - WC PHYS DEBR WO ANESTH 20 SQ CM 1 ICD-10 Diagnosis Description L97.522 Non-pressure chronic ulcer of other part of left foot with fat layer exposed Quantity: Electronic Signature(s) Signed: 09/08/2021 2:32:46 PM By: Fredirick Maudlin MD FACS Entered By: Fredirick Maudlin on 09/08/2021 14:32:46

## 2021-09-13 ENCOUNTER — Ambulatory Visit (INDEPENDENT_AMBULATORY_CARE_PROVIDER_SITE_OTHER): Admitting: Cardiology

## 2021-09-13 ENCOUNTER — Encounter: Payer: Self-pay | Admitting: Cardiology

## 2021-09-13 VITALS — BP 112/60 | HR 73 | Ht 68.0 in | Wt 132.0 lb

## 2021-09-13 DIAGNOSIS — Z952 Presence of prosthetic heart valve: Secondary | ICD-10-CM

## 2021-09-13 DIAGNOSIS — I442 Atrioventricular block, complete: Secondary | ICD-10-CM | POA: Diagnosis not present

## 2021-09-13 DIAGNOSIS — I482 Chronic atrial fibrillation, unspecified: Secondary | ICD-10-CM

## 2021-09-13 DIAGNOSIS — Z950 Presence of cardiac pacemaker: Secondary | ICD-10-CM

## 2021-09-13 DIAGNOSIS — I70263 Atherosclerosis of native arteries of extremities with gangrene, bilateral legs: Secondary | ICD-10-CM

## 2021-09-13 DIAGNOSIS — I5022 Chronic systolic (congestive) heart failure: Secondary | ICD-10-CM

## 2021-09-22 ENCOUNTER — Ambulatory Visit (HOSPITAL_BASED_OUTPATIENT_CLINIC_OR_DEPARTMENT_OTHER): Payer: Medicare Other | Admitting: General Surgery

## 2021-09-25 ENCOUNTER — Encounter (HOSPITAL_BASED_OUTPATIENT_CLINIC_OR_DEPARTMENT_OTHER): Attending: General Surgery | Admitting: General Surgery

## 2021-09-25 DIAGNOSIS — R1310 Dysphagia, unspecified: Secondary | ICD-10-CM | POA: Insufficient documentation

## 2021-09-25 DIAGNOSIS — E43 Unspecified severe protein-calorie malnutrition: Secondary | ICD-10-CM | POA: Insufficient documentation

## 2021-09-25 DIAGNOSIS — I70263 Atherosclerosis of native arteries of extremities with gangrene, bilateral legs: Secondary | ICD-10-CM | POA: Diagnosis present

## 2021-09-25 DIAGNOSIS — L97512 Non-pressure chronic ulcer of other part of right foot with fat layer exposed: Secondary | ICD-10-CM | POA: Diagnosis not present

## 2021-09-25 DIAGNOSIS — I129 Hypertensive chronic kidney disease with stage 1 through stage 4 chronic kidney disease, or unspecified chronic kidney disease: Secondary | ICD-10-CM | POA: Diagnosis not present

## 2021-09-25 DIAGNOSIS — N1831 Chronic kidney disease, stage 3a: Secondary | ICD-10-CM | POA: Diagnosis not present

## 2021-09-25 DIAGNOSIS — Z931 Gastrostomy status: Secondary | ICD-10-CM | POA: Diagnosis not present

## 2021-09-25 DIAGNOSIS — L97522 Non-pressure chronic ulcer of other part of left foot with fat layer exposed: Secondary | ICD-10-CM | POA: Insufficient documentation

## 2021-09-27 NOTE — Progress Notes (Addendum)
Gregory, [name] ([id number]) Visit Report for 09/25/2021 Chief Complaint Document Details Patient Name: Date of Service: Gregory [name] RNO LD 09/25/2021 12:30 PM Medical Record Number: [id number] Patient Account Number: [id number] Date of Birth/Sex: Treating [name]: [birth year] (88 y.o. M) Primary Care Provider: Wylene [name] Other Clinician: Referring Provider: Treating Provider/Extender: Roxy Cedar in Treatment: 20 Information Obtained from: Patient Chief Complaint Patient is at the clinic for treatment of an open pressure ulcer on his left heel and dry gangrene of both great toes Electronic Signature(s) Signed: 09/25/2021 1:39:41 PM By: Gregory [name] FACS Entered By: Gregory Maudlin on 09/25/2021 13:39:41 -------------------------------------------------------------------------------- Debridement Details Patient Name: Date of Service: Gregory [name], [name] 09/25/2021 12:30 PM Medical Record Number: [id number] Patient Account Number: [id number] Date of Birth/Sex: Treating [name]: 1920-08-16 (88 y.o. [name] Primary Care Provider: Wylene [name] Other Clinician: Referring Provider: Treating Provider/Extender: Roxy Cedar in Treatment: 20 Debridement Performed for Assessment: Wound #7 Left,Lateral Foot Performed By: Physician Gregory Maudlin, [name] Debridement Type: Debridement Level of Consciousness (Pre-procedure): Awake and Alert Pre-procedure Verification/Time Out Yes - 13:20 Taken: Start Time: 13:20 Pain Control: Lidocaine 4% T opical Solution T Area Debrided (L x W): otal 0.5 (cm) x 0.5 (cm) = 0.25 (cm) Tissue and other material debrided: Non-Viable, Eschar Level: Non-Viable Tissue Debridement Description: Selective/Open Wound Instrument: Curette Bleeding: Minimum Hemostasis Achieved: Pressure End Time: 13:21 Procedural Pain: 0 Post Procedural Pain: 0 Response to Treatment: Procedure was tolerated  well Level of Consciousness (Post- Awake and Alert procedure): Post Debridement Measurements of Total Wound Length: (cm) 0.5 Stage: Category/Stage II Width: (cm) 0.5 Depth: (cm) 0.1 Volume: (cm) 0.02 Character of Wound/Ulcer Post Debridement: Improved Post Procedure Diagnosis Same as Pre-procedure Electronic Signature(s) Signed: 09/25/2021 3:30:27 PM By: Gregory [name] FACS Signed: 09/25/2021 4:53:36 PM By: Gregory [name] Entered By: Gregory Catholic on 09/25/2021 13:23:34 -------------------------------------------------------------------------------- Debridement Details Patient Name: Date of Service: Gregory [name], [name] 09/25/2021 12:30 PM Medical Record Number: [id number] Patient Account Number: [id number] Date of Birth/Sex: Treating [name]: [birth year] (88 y.o. [name] Primary Care Provider: Wylene [name] Other Clinician: Referring Provider: Treating Provider/Extender: Roxy Cedar in Treatment: 20 Debridement Performed for Assessment: Wound #2 Left T Great oe Performed By: Physician Gregory Maudlin, [name] Debridement Type: Debridement Severity of Tissue Pre Debridement: Fat layer exposed Level of Consciousness (Pre-procedure): Awake and Alert Pre-procedure Verification/Time Out Yes - 13:20 Taken: Start Time: 13:20 Pain Control: Lidocaine 4% T opical Solution T Area Debrided (L x W): otal 0.4 (cm) x 0.6 (cm) = 0.24 (cm) Tissue and other material debrided: Non-Viable, Eschar Level: Non-Viable Tissue Debridement Description: Selective/Open Wound Instrument: Curette Bleeding: Minimum Hemostasis Achieved: Pressure End Time: 13:21 Procedural Pain: 0 Post Procedural Pain: 0 Response to Treatment: Procedure was tolerated well Level of Consciousness (Post- Awake and Alert procedure): Post Debridement Measurements of Total Wound Length: (cm) 0.4 Width: (cm) 0.6 Depth: (cm) 0.1 Volume: (cm) 0.019 Character of Wound/Ulcer  Post Debridement: Improved Severity of Tissue Post Debridement: Fat layer exposed Post Procedure Diagnosis Same as Pre-procedure Electronic Signature(s) Signed: 09/25/2021 3:30:27 PM By: Gregory [name] FACS Signed: 09/25/2021 4:53:36 PM By: Gregory [name] Entered By: Gregory Catholic on 09/25/2021 13:27:59 -------------------------------------------------------------------------------- Debridement Details Patient Name: Date of Service: Gregory [name], [name] 09/25/2021 12:30 PM Medical Record Number: [id number] Patient Account Number: [id number] Date of Birth/Sex: Treating [name]: 24-Mar-1920 (88 y.o. [name] Primary Care Provider: Wylene [name] Other Clinician: Referring Provider: Treating Provider/Extender:  Domingo Madeira Weeks in Treatment: 20 Debridement Performed for Assessment: Wound #1 Right T Great oe Performed By: Physician Gregory Maudlin, [name] Debridement Type: Debridement Severity of Tissue Pre Debridement: Fat layer exposed Level of Consciousness (Pre-procedure): Awake and Alert Pre-procedure Verification/Time Out Yes - 13:20 Taken: Start Time: 13:20 Pain Control: Lidocaine 4% T opical Solution T Area Debrided (L x W): otal 0.4 (cm) x 0.7 (cm) = 0.28 (cm) Tissue and other material debrided: Non-Viable, Eschar, Slough, Subcutaneous, Slough Level: Skin/Subcutaneous Tissue Debridement Description: Excisional Instrument: Curette Bleeding: Minimum Hemostasis Achieved: Pressure End Time: 13:21 Procedural Pain: 0 Post Procedural Pain: 0 Response to Treatment: Procedure was tolerated well Level of Consciousness (Post- Awake and Alert procedure): Post Debridement Measurements of Total Wound Length: (cm) 0.4 Width: (cm) 0.7 Depth: (cm) 0.1 Volume: (cm) 0.022 Character of Wound/Ulcer Post Debridement: Improved Severity of Tissue Post Debridement: Fat layer exposed Post Procedure Diagnosis Same as Pre-procedure Electronic  Signature(s) Signed: 09/25/2021 3:30:27 PM By: Gregory [name] FACS Signed: 09/25/2021 4:53:36 PM By: Gregory [name] Entered By: Gregory Catholic on 09/25/2021 13:28:44 -------------------------------------------------------------------------------- Debridement Details Patient Name: Date of Service: Gregory [name], [name] 09/25/2021 12:30 PM Medical Record Number: [id number] Patient Account Number: [id number] Date of Birth/Sex: Treating [name]: [birth year] (88 y.o. [name] Primary Care Provider: Wylene [name] Other Clinician: Referring Provider: Treating Provider/Extender: Roxy Cedar in Treatment: 20 Debridement Performed for Assessment: Wound #5 Right,Medial T Great oe Performed By: Physician Gregory Maudlin, [name] Debridement Type: Debridement Level of Consciousness (Pre-procedure): Awake and Alert Pre-procedure Verification/Time Out Yes - 13:20 Taken: Start Time: 13:20 Pain Control: Lidocaine 4% T opical Solution T Area Debrided (L x W): otal 1.8 (cm) x 1.1 (cm) = 1.98 (cm) Tissue and other material debrided: Non-Viable, Eschar, Slough, Subcutaneous, Slough Level: Skin/Subcutaneous Tissue Debridement Description: Excisional Instrument: Curette Bleeding: Minimum Hemostasis Achieved: Pressure End Time: 13:21 Procedural Pain: 0 Post Procedural Pain: 0 Response to Treatment: Procedure was tolerated well Level of Consciousness (Post- Awake and Alert procedure): Post Debridement Measurements of Total Wound Length: (cm) 1.8 Stage: Category/Stage II Width: (cm) 1.1 Depth: (cm) 0.1 Volume: (cm) 0.156 Character of Wound/Ulcer Post Debridement: Improved Post Procedure Diagnosis Same as Pre-procedure Electronic Signature(s) Signed: 09/25/2021 3:30:27 PM By: Gregory [name] FACS Signed: 09/25/2021 4:53:36 PM By: Gregory [name] Entered By: Gregory Catholic on 09/25/2021  13:30:01 -------------------------------------------------------------------------------- Debridement Details Patient Name: Date of Service: Gregory [name], [name] 09/25/2021 12:30 PM Medical Record Number: [id number] Patient Account Number: [id number] Date of Birth/Sex: Treating [name]: 11/09/1920 (88 y.o. M) Primary Care Provider: Wylene [name] Other Clinician: Referring Provider: Treating Provider/Extender: Roxy Cedar in Treatment: 20 Debridement Performed for Assessment: Wound #4 Right,Lateral Foot Performed By: Physician Gregory Maudlin, [name] Debridement Type: Debridement Level of Consciousness (Pre-procedure): Awake and Alert Pre-procedure Verification/Time Out Yes - 13:20 Taken: Start Time: 13:20 Pain Control: Lidocaine 4% T opical Solution T Area Debrided (L x W): otal 1.4 (cm) x 1 (cm) = 1.4 (cm) Tissue and other material debrided: Non-Viable, Eschar, Slough, Subcutaneous, Tendon, Slough Level: Skin/Subcutaneous Tissue/Muscle Debridement Description: Excisional Instrument: Curette Bleeding: Minimum Hemostasis Achieved: Pressure End Time: 13:21 Procedural Pain: 0 Post Procedural Pain: 0 Response to Treatment: Procedure was tolerated well Level of Consciousness (Post- Awake and Alert procedure): Post Debridement Measurements of Total Wound Length: (cm) 1.4 Stage: Category/Stage II Width: (cm) 1 Depth: (cm) 0.1 Volume: (cm) 0.11 Character of Wound/Ulcer Post Debridement: Improved Post Procedure Diagnosis Same as Pre-procedure Electronic Signature(s) Signed:  09/27/2021 8:40:33 AM By: Gregory [name] FACS Previous Signature: 09/25/2021 3:30:27 PM Version By: Gregory [name] FACS Previous Signature: 09/25/2021 4:53:36 PM Version By: Gregory [name] Entered By: Gregory Maudlin on 09/27/2021 08:40:33 -------------------------------------------------------------------------------- HPI Details Patient Name: Date of Service: Gregory [name], [name] 09/25/2021 12:30 PM Medical Record Number: [id number] Patient Account Number: [id number] Date of Birth/Sex: Treating [name]: [birth year] (88 y.o. M) Primary Care Provider: Wylene [name] Other Clinician: Referring Provider: Treating Provider/Extender: Roxy Cedar in Treatment: 20 History of Present Illness HPI Description: ADMISSION 05/05/2021 This is an extremely frail 88 year old man who presents to clinic today accompanied by his son for evaluation of a pressure ulcer on his left heel. He also has dry gangrene on the tips of his bilateral great toes and the lateral aspect of both feet. It sounds like after a hospitalization for a GI bleed, he had a blanket on his feet which ultimately resulted in a pressure injury. He has severe dysphagia and is dependent on G-tube feeding. The wound on his heel has been present since January. He has 24-hour care and they have been applying Xeroform to the heel. He apparently has some sort of air boot that he is meant to wear in bed at night. He has severe peripheral arterial disease as evidenced by the ABIs obtained by Dr. Rachelle Hora and copied here: ABI Findings: +---------+------------------+-----+----------+--------+ Right Rt Pressure (mmHg)IndexWaveform Comment  +---------+------------------+-----+----------+--------+ Brachial 136     +---------+------------------+-----+----------+--------+ PTA >255 [place] monophasic  +---------+------------------+-----+----------+--------+ DP >255 [place] monophasic  +---------+------------------+-----+----------+--------+ Great T oe24 0.18    +---------+------------------+-----+----------+--------+ +---------+------------------+-----+----------+-------+ Left Lt Pressure (mmHg)IndexWaveform Comment +---------+------------------+-----+----------+-------+ Brachial 136     +---------+------------------+-----+----------+-------+ PTA >255  [place] monophasic  +---------+------------------+-----+----------+-------+ DP >255 [place] monophasic  +---------+------------------+-----+----------+-------+ Great T oe0 0.00 Absent   +---------+------------------+-----+----------+-------+ +-------+-----------+-----------+------------+------------+ ABI/TBIT oday's ABIT oday's TBIPrevious ABIPrevious TBI +-------+-----------+-----------+------------+------------+ Right  0.18    +-------+-----------+-----------+------------+------------+ Left  0.00    +-------+-----------+-----------+------------+------------+ Summary: Right: Resting right ankle-brachial index indicates noncompressible right lower extremity arteries. The right toe-brachial index is abnormal. Left: Resting left ankle-brachial index indicates noncompressible left lower extremity arteries. Absent left great toe PPG waveform. He saw Dr. Rachelle Hora on March 16 and at that time, he was felt to be far too frail to undergo any sort of intervention including an aortogram. Certainly not a surgical candidate. According to the electronic medical record, Dr. Rachelle Hora is planning to see him back in a couple of weeks and Gregory determine if he has made any significant improvement in his overall status and might be suitable for an intervention. I read his note to suggest that the patient has had multiple complications from other procedures and that even an aortogram may be too risky. 05/12/2021: Rather remarkably, the eschar and dry gangrene on his bilateral great toes and bilateral fifth metatarsals actually looks better. Some of the eschar on his toes is peeling away to reveal healing tissue underneath. This is also the case with his lateral foot bilaterally. The heel is clean and superficial, with minimal slough. 06/07/2021: Since our last visit, Mr. Arismendez has been in the hospital quite a bit. While in the hospital, they were painting his lateral feet and great  toes with Betadine. The wound on his heel is nearly closed with just a sliver of open skin. They have been floating his heels as directed and his home health nurse recently ordered a device to keep his blankets from rubbing on his toes. They have continued using the Betadine on his lateral feet  and great toes and the Prisma silver collagen on his left heel. He does have a bit of callus buildup on his right Achilles tendon area suggesting there is some pressure being applied. 06/15/2021: The left heel wound is closed. There is some slough buildup in the right lateral foot wound. Both great toe wounds have a layer of thick dark eschar, but they appear to have some viable tissue underneath this. He is complaining of pain in his right heel; he had a wound there at one time. On inspection, there is no open skin at this site. 06/23/2021: The left heel remains closed. The right heel never has opened. Both great toe wounds continue to have a layer of thick dark eschar, but it is softer today. The right lateral foot wound also has eschar overlying the surface which has some accumulated slough. He also has areas on his great toes bilaterally that look like they have been rubbing on the slippers that he wears all day. 07/06/2021: The right heel has a new callus on it. It peeled off as I was examining the site and there is a small open wound in that area. The right great toe looks worse today; there are more darkened areas of eschar and the toe itself feels a bit boggy. The right lateral foot is small with just a small amount of slough. The left great toe is in reasonable condition with just a layer of fairly soft eschar at the tip. According to the patient's family, he has been insisting upon blankets being wrapped over his feet at night and he does not cooperate with heel flotation. He also required a 2 unit blood transfusion last week for fairly significant anemia. 07/13/2021: Both heels are closed. The right  lateral foot wound is small, but about the same size as last week. There is some accumulated slough within the wound. The right great toe looks better today; it is no longer boggy and the Betadine paint has dried up the areas that were concerning to me. The left great toe looks to be responding nicely to the Iodosorb dressing. 07/21/2021: The heels remain closed. The right lateral foot wound is about the same size as last week, but he is complaining of more pain in the area and there is some perimeter erythema. The right great toe is looking much better today; the Betadine paint continues to keep the areas of concern dry. The left great toe is doing well with a nice wound surface underneath the crusted-on Iodosorb. 07/28/2021: The right lateral foot wound remains basically unchanged with some overlying eschar, underneath which there is a very small amount of slough. The right great toe is looking worse today. It is red and warm and swollen. There is what looks like a blood blister underneath the eschar on the medial aspect of the toe. The distal tip, however, does not look too bad. The left great toe wound is smaller. 08/07/2021: The right lateral foot wound has closed in quite a bit and is down to just a pinhole. The right great toe continues to be red and inflamed. He did not tolerate Bactrim and so he has been taking levofloxacin. I do not see that is making a tremendous difference in the overall appearance of the toe, but the wounds themselves look a little bit better. The medial aspect has some eschar accumulation and the tip has some slough buildup. The left great toe has some slough accumulation, but overall looks better than the right. He has  not been compliant with the recommendation to keep blankets from resting on the tops of his toes; he says his feet get cold at night. 08/31/2021: In the interim since his last visit, the patient was hospitalized with a CHF exacerbation. In that time, the ulcer  on his right heel has reopened; he has a new ulcer on his left lateral foot at the level of the fifth metatarsal head, the ulcer on his right lateral foot at the fifth metatarsal head has enlarged and deepened. The tips of his great toes look a little bit more robust, but the medial wound on the right great toe has accumulated a lot of slough and is larger. He did undergo removal of his right great toenail shortly after we saw him last. He has multiple pairs of Prevalon boots but does not wear them. 09/08/2021: After his last visit here, the patient was hospitalized at [facility] secondary to bright red blood per rectum and displaced G-tube. Those issues have since been resolved. They applied Mepilex dressings to his wounds while he was there. T oday, the right heel ulcer has healed. The left lateral foot wound is very superficial with just a light layer of eschar. The right great toe wound is smaller but still has slough accumulation and some nonviable subcu tissue. The right great toe has a blister in the interspace between the first and second toe. This was unroofed and the underlying skin did not appear to be open. The medial great toe ulcer has nonviable tissue and eschar present, as does the lateral right foot wound. The tip of the great toe on the right has a small wound with slough accumulation. 09/25/2021: Mr. Tooker has entered hospice care. His daughter wanted his wounds cleaned up 1 last time, as they Gregory now be responsible for his treatments here out of pocket and they do not intend to return after today. The wound on his left great toe and left fifth lateral metatarsal head were found to be closed after the eschar was removed. There is still tendon exposed under the eschar on his right fifth metatarsal head (lateral). Still with eschar at the tip and medial aspect of his right great toe. He saw his PCP a couple of days ago and there was a lot of erythema and swelling on his left dorsal foot. The  PCP prescribed Cipro, but the pharmacy made an error and it was not available until today. He Gregory begin taking this. Electronic Signature(s) Signed: 09/25/2021 1:41:53 PM By: Gregory [name] FACS Entered By: Gregory Maudlin on 09/25/2021 13:41:52 -------------------------------------------------------------------------------- Physical Exam Details Patient Name: Date of Service: Gregory [name], [name] 09/25/2021 12:30 PM Medical Record Number: [id number] Patient Account Number: [id number] Date of Birth/Sex: Treating [name]: [birth year] (88 y.o. M) Primary Care Provider: Wylene [name] Other Clinician: Referring Provider: Treating Provider/Extender: Roxy Cedar in Treatment: 20 Constitutional . . . . No acute distress. Very drowsy.Marland Kitchen Respiratory Normal work of breathing on room air.. Notes 09/25/2021: The wound on his left great toe and left fifth lateral metatarsal head were found to be closed after the eschar was removed. There is still tendon exposed under the eschar on his right fifth metatarsal head (lateral). Still with eschar at the tip and medial aspect of his right great toe. Electronic Signature(s) Signed: 09/25/2021 1:42:38 PM By: Gregory [name] FACS Signed: 09/25/2021 1:42:38 PM By: Gregory [name] FACS Entered By: Gregory Maudlin on 09/25/2021 13:42:37 -------------------------------------------------------------------------------- Physician Orders Details Patient Name: Date  of Service: Gregory [name], [name] 09/25/2021 12:30 PM Medical Record Number: [id number] Patient Account Number: [id number] Date of Birth/Sex: Treating [name]: [birth year] (88 y.o. [name] Primary Care Provider: Wylene [name] Other Clinician: Referring Provider: Treating Provider/Extender: Roxy Cedar in Treatment: 20 Verbal / Phone Orders: No Diagnosis Coding ICD-10 Coding Code Description I70.263 Atherosclerosis of native  arteries of extremities with gangrene, bilateral legs I73.9 Peripheral vascular disease, unspecified E43 Unspecified severe protein-calorie malnutrition N18.31 Chronic kidney disease, stage 3a R13.10 Dysphagia, unspecified L97.512 Non-pressure chronic ulcer of other part of right foot with fat layer exposed L97.522 Non-pressure chronic ulcer of other part of left foot with fat layer exposed Follow-up Appointments ppointment in 2 weeks. - Dr Celine Ahr Return A Bathing/ Shower/ Hygiene May shower and wash wound with soap and water. Edema Control - Lymphedema / SCD / Other Elevate legs to the level of the heart or above for 30 minutes daily and/or when sitting, a frequency of: - throughout the day Avoid standing for long periods of time. Non Wound Condition Bilateral Lower Extremities Protect area with: - Right and Left heels protect with Heel Cups Other Non Wound Condition Orders/Instructions: - Bilateral Heels- heel cups + gauze Home Health No change in wound care orders this week; continue Home Health for wound care. May utilize formulary equivalent dressing for wound treatment orders unless otherwise specified. Other Home Health Orders/Instructions: - [facility] Wound Treatment Wound #1 - T Great oe Wound Laterality: Right Cleanser: Soap and Water 1 x Per Day/30 Days Discharge Instructions: May shower and wash wound with dial antibacterial soap and water prior to dressing change. Cleanser: Wound Cleanser 1 x Per Day/30 Days Discharge Instructions: Cleanse the wound with wound cleanser or normal saline prior to applying a clean dressing using gauze sponges, not tissue or cotton balls. Topical: Gentamicin 1 x Per Day/30 Days Discharge Instructions: As directed by physician Prim Dressing: KerraCel Ag Gelling Fiber Dressing, 2x2 in (silver alginate) 1 x Per Day/30 Days ary Discharge Instructions: Apply silver alginate to wound bed as instructed Secondary Dressing: Woven Gauze Sponges 2x2  in ([place]) 1 x Per Day/30 Days Discharge Instructions: Apply over primary dressing as directed. Secured With: Child psychotherapist, Sterile 2x75 (in/in) (Home Health) 1 x Per Day/30 Days Discharge Instructions: Secure with stretch gauze as directed. Wound #4 - Foot Wound Laterality: Right, Lateral Cleanser: Soap and Water 1 x Per Day/30 Days Discharge Instructions: May shower and wash wound with dial antibacterial soap and water prior to dressing change. Cleanser: Wound Cleanser 1 x Per Day/30 Days Discharge Instructions: Cleanse the wound with wound cleanser or normal saline prior to applying a clean dressing using gauze sponges, not tissue or cotton balls. Topical: Gentamicin 1 x Per Day/30 Days Discharge Instructions: As directed by physician Prim Dressing: KerraCel Ag Gelling Fiber Dressing, 2x2 in (silver alginate) 1 x Per Day/30 Days ary Discharge Instructions: Apply silver alginate to wound bed as instructed Secondary Dressing: ALLEVYN Gentle Border, 3x3 (in/in) 1 x Per Day/30 Days Discharge Instructions: Apply over primary dressing as directed. Secondary Dressing: Woven Gauze Sponges 2x2 in (Home Health) 1 x Per Day/30 Days Discharge Instructions: Apply over primary dressing as directed. Add-Ons: Mepilex Plus 1 x Per Day/30 Days Discharge Instructions: if available Wound #5 - T Great oe Wound Laterality: Right, Medial Cleanser: Soap and Water 1 x Per Day/30 Days Discharge Instructions: May shower and wash wound with dial antibacterial soap and water prior to dressing change. Cleanser: Wound  Cleanser 1 x Per Day/30 Days Discharge Instructions: Cleanse the wound with wound cleanser or normal saline prior to applying a clean dressing using gauze sponges, not tissue or cotton balls. Topical: Gentamicin 1 x Per Day/30 Days Discharge Instructions: As directed by physician Prim Dressing: KerraCel Ag Gelling Fiber Dressing, 2x2 in (silver alginate) 1 x Per Day/30  Days ary Discharge Instructions: Apply silver alginate to wound bed as instructed Secondary Dressing: Woven Gauze Sponges 2x2 in ([place]) 1 x Per Day/30 Days Discharge Instructions: Apply over primary dressing as directed. Secured With: Child psychotherapist, Sterile 2x75 (in/in) (Home Health) 1 x Per Day/30 Days Discharge Instructions: Secure with stretch gauze as directed. Electronic Signature(s) Signed: 09/25/2021 3:30:27 PM By: Gregory [name] FACS Entered By: Gregory Maudlin on 09/25/2021 13:51:18 -------------------------------------------------------------------------------- Problem List Details Patient Name: Date of Service: Gregory [name], [name] 09/25/2021 12:30 PM Medical Record Number: [id number] Patient Account Number: [id number] Date of Birth/Sex: Treating [name]: [birth year] (88 y.o. 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, bilateral legs 05/05/2021 No Yes I73.9 Peripheral vascular disease, unspecified 05/05/2021 No Yes E43 Unspecified severe protein-calorie malnutrition 05/05/2021 No Yes N18.31 Chronic kidney disease, stage 3a 05/05/2021 No Yes R13.10 Dysphagia, unspecified 05/05/2021 No Yes L97.512 Non-pressure chronic ulcer of other part of right foot with fat layer exposed 06/07/2021 No Yes L97.522 Non-pressure chronic ulcer of other part of left foot with fat layer exposed 06/07/2021 No Yes Inactive Problems ICD-10 Code Description Active Date Inactive Date L89.622 Pressure ulcer of left heel, stage 2 05/05/2021 05/05/2021 L89.612 Pressure ulcer of right heel, stage 2 07/06/2021 07/06/2021 Resolved Problems Electronic Signature(s) Signed: 09/25/2021 1:39:24 PM By: Gregory [name]  FACS Entered By: Gregory Maudlin on 09/25/2021 13:39:24 -------------------------------------------------------------------------------- Progress Note Details Patient Name: Date of Service: Gregory [name], [name] 09/25/2021 12:30 PM Medical Record Number: [id number] Patient Account Number: [id number] Date of Birth/Sex: Treating [name]: 05/10/1920 (88 y.o. M) Primary Care Provider: Wylene [name] Other Clinician: Referring Provider: Treating Provider/Extender: Roxy Cedar in Treatment: 20 Subjective Chief Complaint Information obtained from Patient Patient is at the clinic for treatment of an open pressure ulcer on his left heel and dry gangrene of both great toes History of Present Illness (HPI) ADMISSION 05/05/2021 This is an extremely frail 88 year old man who presents to clinic today accompanied by his son for evaluation of a pressure ulcer on his left heel. He also has dry gangrene on the tips of his bilateral great toes and the lateral aspect of both feet. It sounds like after a hospitalization for a GI bleed, he had a blanket on his feet which ultimately resulted in a pressure injury. He has severe dysphagia and is dependent on G-tube feeding. The wound on his heel has been present since January. He has 24-hour care and they have been applying Xeroform to the heel. He apparently has some sort of air boot that he is meant to wear in bed at night. He has severe peripheral arterial disease as evidenced by the ABIs obtained by Dr. Rachelle Hora and copied here: ABI Findings: +---------+------------------+-----+----------+--------+ Right Rt Pressure (mmHg)IndexWaveform Comment  +---------+------------------+-----+----------+--------+ Brachial 136    +---------+------------------+-----+----------+--------+ PTA >255 [place] monophasic  +---------+------------------+-----+----------+--------+ DP >255 [place] monophasic   +---------+------------------+-----+----------+--------+ Great T oe24 0.18    +---------+------------------+-----+----------+--------+ +---------+------------------+-----+----------+-------+ Left Lt Pressure (mmHg)IndexWaveform Comment +---------+------------------+-----+----------+-------+  Brachial 136    +---------+------------------+-----+----------+-------+ PTA >255 [place] monophasic  +---------+------------------+-----+----------+-------+ DP >255 [place] monophasic  +---------+------------------+-----+----------+-------+ Great T oe0 0.00 Absent   +---------+------------------+-----+----------+-------+ +-------+-----------+-----------+------------+------------+ ABI/TBIT oday's ABIT oday's TBIPrevious ABIPrevious TBI +-------+-----------+-----------+------------+------------+ Right [place] 0.18    +-------+-----------+-----------+------------+------------+ Left [place] 0.00    +-------+-----------+-----------+------------+------------+ Summary: Right: Resting right ankle-brachial index indicates noncompressible right lower extremity arteries. The right toe-brachial index is abnormal. Left: Resting left ankle-brachial index indicates noncompressible left lower extremity arteries. Absent left great toe PPG waveform. He saw Dr. Rachelle Hora on March 16 and at that time, he was felt to be far too frail to undergo any sort of intervention including an aortogram. Certainly not a surgical candidate. According to the electronic medical record, Dr. Rachelle Hora is planning to see him back in a couple of weeks and Gregory determine if he has made any significant improvement in his overall status and might be suitable for an intervention. I read his note to suggest that the patient has had multiple complications from other procedures and that even an aortogram may be too risky. 05/12/2021: Rather remarkably, the eschar and dry gangrene on his bilateral great toes  and bilateral fifth metatarsals actually looks better. Some of the eschar on his toes is peeling away to reveal healing tissue underneath. This is also the case with his lateral foot bilaterally. The heel is clean and superficial, with minimal slough. 06/07/2021: Since our last visit, Mr. Niehoff has been in the hospital quite a bit. While in the hospital, they were painting his lateral feet and great toes with Betadine. The wound on his heel is nearly closed with just a sliver of open skin. They have been floating his heels as directed and his home health nurse recently ordered a device to keep his blankets from rubbing on his toes. They have continued using the Betadine on his lateral feet and great toes and the Prisma silver collagen on his left heel. He does have a bit of callus buildup on his right Achilles tendon area suggesting there is some pressure being applied. 06/15/2021: The left heel wound is closed. There is some slough buildup in the right lateral foot wound. Both great toe wounds have a layer of thick dark eschar, but they appear to have some viable tissue underneath this. He is complaining of pain in his right heel; he had a wound there at one time. On inspection, there is no open skin at this site. 06/23/2021: The left heel remains closed. The right heel never has opened. Both great toe wounds continue to have a layer of thick dark eschar, but it is softer today. The right lateral foot wound also has eschar overlying the surface which has some accumulated slough. He also has areas on his great toes bilaterally that look like they have been rubbing on the slippers that he wears all day. 07/06/2021: The right heel has a new callus on it. It peeled off as I was examining the site and there is a small open wound in that area. The right great toe looks worse today; there are more darkened areas of eschar and the toe itself feels a bit boggy. The right lateral foot is small with just a small  amount of slough. The left great toe is in reasonable condition with just a layer of fairly soft eschar at the tip. According to the patient's family, he has been insisting upon blankets being wrapped over his feet at night and he does not cooperate with heel flotation. He also required  a 2 unit blood transfusion last week for fairly significant anemia. 07/13/2021: Both heels are closed. The right lateral foot wound is small, but about the same size as last week. There is some accumulated slough within the wound. The right great toe looks better today; it is no longer boggy and the Betadine paint has dried up the areas that were concerning to me. The left great toe looks to be responding nicely to the Iodosorb dressing. 07/21/2021: The heels remain closed. The right lateral foot wound is about the same size as last week, but he is complaining of more pain in the area and there is some perimeter erythema. The right great toe is looking much better today; the Betadine paint continues to keep the areas of concern dry. The left great toe is doing well with a nice wound surface underneath the crusted-on Iodosorb. 07/28/2021: The right lateral foot wound remains basically unchanged with some overlying eschar, underneath which there is a very small amount of slough. The right great toe is looking worse today. It is red and warm and swollen. There is what looks like a blood blister underneath the eschar on the medial aspect of the toe. The distal tip, however, does not look too bad. The left great toe wound is smaller. 08/07/2021: The right lateral foot wound has closed in quite a bit and is down to just a pinhole. The right great toe continues to be red and inflamed. He did not tolerate Bactrim and so he has been taking levofloxacin. I do not see that is making a tremendous difference in the overall appearance of the toe, but the wounds themselves look a little bit better. The medial aspect has some eschar  accumulation and the tip has some slough buildup. The left great toe has some slough accumulation, but overall looks better than the right. He has not been compliant with the recommendation to keep blankets from resting on the tops of his toes; he says his feet get cold at night. 08/31/2021: In the interim since his last visit, the patient was hospitalized with a CHF exacerbation. In that time, the ulcer on his right heel has reopened; he has a new ulcer on his left lateral foot at the level of the fifth metatarsal head, the ulcer on his right lateral foot at the fifth metatarsal head has enlarged and deepened. The tips of his great toes look a little bit more robust, but the medial wound on the right great toe has accumulated a lot of slough and is larger. He did undergo removal of his right great toenail shortly after we saw him last. He has multiple pairs of Prevalon boots but does not wear them. 09/08/2021: After his last visit here, the patient was hospitalized at [facility] secondary to bright red blood per rectum and displaced G-tube. Those issues have since been resolved. They applied Mepilex dressings to his wounds while he was there. T oday, the right heel ulcer has healed. The left lateral foot wound is very superficial with just a light layer of eschar. The right great toe wound is smaller but still has slough accumulation and some nonviable subcu tissue. The right great toe has a blister in the interspace between the first and second toe. This was unroofed and the underlying skin did not appear to be open. The medial great toe ulcer has nonviable tissue and eschar present, as does the lateral right foot wound. The tip of the great toe on the right has a small  wound with slough accumulation. 09/25/2021: Mr. Coe has entered hospice care. His daughter wanted his wounds cleaned up 1 last time, as they Gregory now be responsible for his treatments here out of pocket and they do not intend to return  after today. The wound on his left great toe and left fifth lateral metatarsal head were found to be closed after the eschar was removed. There is still tendon exposed under the eschar on his right fifth metatarsal head (lateral). Still with eschar at the tip and medial aspect of his right great toe. He saw his PCP a couple of days ago and there was a lot of erythema and swelling on his left dorsal foot. The PCP prescribed Cipro, but the pharmacy made an error and it was not available until today. He Gregory begin taking this. Patient History Information obtained from Patient, Caregiver. Family History Unknown History. Social History Never smoker, Marital Status - Widowed, Alcohol Use - Never, Drug Use - No History, Caffeine Use - Never. Medical History Cardiovascular Patient has history of Arrhythmia - A-Fib, Hypertension Medical A Surgical History Notes nd Cardiovascular Pacemaker, Complete heart block Gastrointestinal Dysphagia, PEG tube in place Endocrine Hypothyroidism Genitourinary CKD-3 Neurologic Neuromuscular disorder Objective Constitutional No acute distress. Very drowsy.. Vitals Time Taken: 12:53 PM, Temperature: 97.5 F, Pulse: 71 bpm, Respiratory Rate: 16 breaths/min, Blood Pressure: 117/69 mmHg. Respiratory Normal work of breathing on room air.. General Notes: 09/25/2021: The wound on his left great toe and left fifth lateral metatarsal head were found to be closed after the eschar was removed. There is still tendon exposed under the eschar on his right fifth metatarsal head (lateral). Still with eschar at the tip and medial aspect of his right great toe. Integumentary (Hair, Skin) Wound #1 status is Open. Original cause of wound was Pressure Injury. The date acquired was: 02/12/2021. The wound has been in treatment 20 weeks. The wound is located on the Right T Great. The wound measures 0.4cm length x 0.7cm width x 0.1cm depth; 0.22cm^2 area and 0.022cm^3 volume. There is  Fat oe Layer (Subcutaneous Tissue) exposed. There is no tunneling or undermining noted. There is a medium amount of serosanguineous drainage noted. The wound margin is flat and intact. There is small (1-33%) red granulation within the wound bed. There is a large (67-100%) amount of necrotic tissue within the wound bed including Eschar and Adherent Slough. Wound #2 status is Healed - Epithelialized. Original cause of wound was Pressure Injury. The date acquired was: 02/12/2021. The wound has been in treatment 20 weeks. The wound is located on the Left T Great. The wound measures 0cm length x 0cm width x 0cm depth; 0cm^2 area and 0cm^3 volume. There is no oe tunneling or undermining noted. There is a none present amount of drainage noted. The wound margin is flat and intact. There is no granulation within the wound bed. There is no necrotic tissue within the wound bed. Wound #4 status is Open. Original cause of wound was Gradually Appeared. The date acquired was: 04/12/2021. The wound has been in treatment 15 weeks. The wound is located on the Right,Lateral Foot. The wound measures 1.4cm length x 1cm width x 0.1cm depth; 1.1cm^2 area and 0.11cm^3 volume. There is Fat Layer (Subcutaneous Tissue) exposed. There is no tunneling or undermining noted. There is a small amount of serosanguineous drainage noted. The wound margin is flat and intact. There is small (1-33%) pink granulation within the wound bed. There is a large (67-100%) amount of necrotic  tissue within the wound bed including Eschar and Adherent Slough. Wound #5 status is Open. Original cause of wound was Blister. The date acquired was: 06/12/2021. The wound has been in treatment 13 weeks. The wound is located on the Right,Medial T Great. The wound measures 1.8cm length x 1.1cm width x 0cm depth; 1.555cm^2 area and 0.156cm^3 volume. There is Fat oe Layer (Subcutaneous Tissue) exposed. There is no tunneling or undermining noted. There is a medium  amount of serosanguineous drainage noted. There is small (1-33%) red granulation within the wound bed. There is a large (67-100%) amount of necrotic tissue within the wound bed including Eschar and Adherent Slough. Wound #7 status is Healed - Epithelialized. Original cause of wound was Gradually Appeared. The date acquired was: 08/31/2021. The wound has been in treatment 3 weeks. The wound is located on the Left,Lateral Foot. The wound measures 0cm length x 0cm width x 0cm depth; 0cm^2 area and 0cm^3 volume. There is no tunneling or undermining noted. There is a none present amount of drainage noted. There is no granulation within the wound bed. There is no necrotic tissue within the wound bed. Assessment Active Problems ICD-10 Atherosclerosis of native arteries of extremities with gangrene, bilateral legs Peripheral vascular disease, unspecified Unspecified severe protein-calorie malnutrition Chronic kidney disease, stage 3a Dysphagia, unspecified Non-pressure chronic ulcer of other part of right foot with fat layer exposed Non-pressure chronic ulcer of other part of left foot with fat layer exposed Procedures Wound #1 Pre-procedure diagnosis of Wound #1 is an Arterial Insufficiency Ulcer located on the Right T Great .Severity of Tissue Pre Debridement is: Fat layer oe exposed. There was a Excisional Skin/Subcutaneous Tissue Debridement with a total area of 0.28 sq cm performed by Gregory Maudlin, [name]. With the following instrument(s): Curette to remove Non-Viable tissue/material. Material removed includes Eschar, Subcutaneous Tissue, and Slough after achieving pain control using Lidocaine 4% T opical Solution. No specimens were taken. A time out was conducted at 13:20, prior to the start of the procedure. A Minimum amount of bleeding was controlled with Pressure. The procedure was tolerated well with a pain level of 0 throughout and a pain level of 0 following the procedure. Post  Debridement Measurements: 0.4cm length x 0.7cm width x 0.1cm depth; 0.022cm^3 volume. Character of Wound/Ulcer Post Debridement is improved. Severity of Tissue Post Debridement is: Fat layer exposed. Post procedure Diagnosis Wound #1: Same as Pre-Procedure Wound #2 Pre-procedure diagnosis of Wound #2 is an Arterial Insufficiency Ulcer located on the Left T Great .Severity of Tissue Pre Debridement is: Fat layer oe exposed. There was a Selective/Open Wound Non-Viable Tissue Debridement with a total area of 0.24 sq cm performed by Gregory Maudlin, [name]. With the following instrument(s): Curette to remove Non-Viable tissue/material. Material removed includes Eschar after achieving pain control using Lidocaine 4% Topical Solution. No specimens were taken. A time out was conducted at 13:20, prior to the start of the procedure. A Minimum amount of bleeding was controlled with Pressure. The procedure was tolerated well with a pain level of 0 throughout and a pain level of 0 following the procedure. Post Debridement Measurements: 0.4cm length x 0.6cm width x 0.1cm depth; 0.019cm^3 volume. Character of Wound/Ulcer Post Debridement is improved. Severity of Tissue Post Debridement is: Fat layer exposed. Post procedure Diagnosis Wound #2: Same as Pre-Procedure Wound #4 Pre-procedure diagnosis of Wound #4 is a Pressure Ulcer located on the Right,Lateral Foot . There was a Excisional Skin/Subcutaneous Tissue/Muscle Debridement with a total area of 1.4 sq  cm performed by Gregory Maudlin, [name]. With the following instrument(s): Curette to remove Non-Viable tissue/material. Material removed includes Eschar, T endon, Subcutaneous Tissue, and Slough after achieving pain control using Lidocaine 4% Topical Solution. No specimens were taken. A time out was conducted at 13:20, prior to the start of the procedure. A Minimum amount of bleeding was controlled with Pressure. The procedure was tolerated well with a pain level  of 0 throughout and a pain level of 0 following the procedure. Post Debridement Measurements: 1.4cm length x 1cm width x 0.1cm depth; 0.11cm^3 volume. Post debridement Stage noted as Category/Stage II. Character of Wound/Ulcer Post Debridement is improved. Post procedure Diagnosis Wound #4: Same as Pre-Procedure Wound #5 Pre-procedure diagnosis of Wound #5 is a Pressure Ulcer located on the Right,Medial T Great . There was a Excisional Skin/Subcutaneous Tissue oe Debridement with a total area of 1.98 sq cm performed by Gregory Maudlin, [name]. With the following instrument(s): Curette to remove Non-Viable tissue/material. Material removed includes Eschar, Subcutaneous Tissue, and Slough after achieving pain control using Lidocaine 4% T opical Solution. No specimens were taken. A time out was conducted at 13:20, prior to the start of the procedure. A Minimum amount of bleeding was controlled with Pressure. The procedure was tolerated well with a pain level of 0 throughout and a pain level of 0 following the procedure. Post Debridement Measurements: 1.8cm length x 1.1cm width x 0.1cm depth; 0.156cm^3 volume. Post debridement Stage noted as Category/Stage II. Character of Wound/Ulcer Post Debridement is improved. Post procedure Diagnosis Wound #5: Same as Pre-Procedure Wound #7 Pre-procedure diagnosis of Wound #7 is a Pressure Ulcer located on the Left,Lateral Foot . There was a Selective/Open Wound Non-Viable Tissue Debridement with a total area of 0.25 sq cm performed by Gregory Maudlin, [name]. With the following instrument(s): Curette to remove Non-Viable tissue/material. Material removed includes Eschar after achieving pain control using Lidocaine 4% T opical Solution. No specimens were taken. A time out was conducted at 13:20, prior to the start of the procedure. A Minimum amount of bleeding was controlled with Pressure. The procedure was tolerated well with a pain level of 0 throughout and a pain  level of 0 following the procedure. Post Debridement Measurements: 0.5cm length x 0.5cm width x 0.1cm depth; 0.02cm^3 volume. Post debridement Stage noted as Category/Stage II. Character of Wound/Ulcer Post Debridement is improved. Post procedure Diagnosis Wound #7: Same as Pre-Procedure Plan Follow-up Appointments: Return Appointment in 2 weeks. - Dr Celine Ahr Bathing/ Shower/ Hygiene: May shower and wash wound with soap and water. Edema Control - Lymphedema / SCD / Other: Elevate legs to the level of the heart or above for 30 minutes daily and/or when sitting, a frequency of: - throughout the day Avoid standing for long periods of time. Non Wound Condition: Protect area with: - Right and Left heels protect with Heel Cups Other Non Wound Condition Orders/Instructions: - Bilateral Heels- heel cups + gauze Home Health: No change in wound care orders this week; continue Home Health for wound care. May utilize formulary equivalent dressing for wound treatment orders unless otherwise specified. Other Home Health Orders/Instructions: - [facility] WOUND #1: - T Great Wound Laterality: Right oe Cleanser: Soap and Water 1 x Per Day/30 Days Discharge Instructions: May shower and wash wound with dial antibacterial soap and water prior to dressing change. Cleanser: Wound Cleanser 1 x Per Day/30 Days Discharge Instructions: Cleanse the wound with wound cleanser or normal saline prior to applying a clean dressing using gauze sponges, not tissue  or cotton balls. Topical: Gentamicin 1 x Per Day/30 Days Discharge Instructions: As directed by physician Prim Dressing: KerraCel Ag Gelling Fiber Dressing, 2x2 in (silver alginate) 1 x Per Day/30 Days ary Discharge Instructions: Apply silver alginate to wound bed as instructed Secondary Dressing: Woven Gauze Sponges 2x2 in (Eugenio Saenz) 1 x Per Day/30 Days Discharge Instructions: Apply over primary dressing as directed. Secured With: Media planner, Sterile 2x75 (in/in) (Home Health) 1 x Per Day/30 Days Discharge Instructions: Secure with stretch gauze as directed. WOUND #4: - Foot Wound Laterality: Right, Lateral Cleanser: Soap and Water 1 x Per Day/30 Days Discharge Instructions: May shower and wash wound with dial antibacterial soap and water prior to dressing change. Cleanser: Wound Cleanser 1 x Per Day/30 Days Discharge Instructions: Cleanse the wound with wound cleanser or normal saline prior to applying a clean dressing using gauze sponges, not tissue or cotton balls. Topical: Gentamicin 1 x Per Day/30 Days Discharge Instructions: As directed by physician Prim Dressing: KerraCel Ag Gelling Fiber Dressing, 2x2 in (silver alginate) 1 x Per Day/30 Days ary Discharge Instructions: Apply silver alginate to wound bed as instructed Secondary Dressing: ALLEVYN Gentle Border, 3x3 (in/in) 1 x Per Day/30 Days Discharge Instructions: Apply over primary dressing as directed. Secondary Dressing: Woven Gauze Sponges 2x2 in (Home Health) 1 x Per Day/30 Days Discharge Instructions: Apply over primary dressing as directed. Add-Ons: Mepilex Plus 1 x Per Day/30 Days Discharge Instructions: if available WOUND #5: - T Great Wound Laterality: Right, Medial oe Cleanser: Soap and Water 1 x Per Day/30 Days Discharge Instructions: May shower and wash wound with dial antibacterial soap and water prior to dressing change. Cleanser: Wound Cleanser 1 x Per Day/30 Days Discharge Instructions: Cleanse the wound with wound cleanser or normal saline prior to applying a clean dressing using gauze sponges, not tissue or cotton balls. Topical: Gentamicin 1 x Per Day/30 Days Discharge Instructions: As directed by physician Prim Dressing: KerraCel Ag Gelling Fiber Dressing, 2x2 in (silver alginate) 1 x Per Day/30 Days ary Discharge Instructions: Apply silver alginate to wound bed as instructed Secondary Dressing: Woven Gauze Sponges 2x2 in ([place])  1 x Per Day/30 Days Discharge Instructions: Apply over primary dressing as directed. Secured With: Child psychotherapist, Sterile 2x75 (in/in) (Home Health) 1 x Per Day/30 Days Discharge Instructions: Secure with stretch gauze as directed. 09/25/2021: The wound on his left great toe and left fifth lateral metatarsal head were found to be closed after the eschar was removed. There is still tendon exposed under the eschar on his right fifth metatarsal head (lateral). Still with eschar at the tip and medial aspect of his right great toe. I debrided the eschar from his left great toe and left fifth lateral metatarsal head. I debrided eschar, subcu, and tendon from the right fifth metatarsal lateral head. I debrided eschar, slough, and subcu tissue from the wounds on his right great toe. We Gregory continue the gentamicin and silver alginate. All future wound care according to hospice. He Gregory complete the course of oral ciprofloxacin that was prescribed by his primary care provider. Due to his hospice status, wound care services Gregory be discontinued and we Gregory discharge him from clinic. Electronic Signature(s) Signed: 09/27/2021 8:40:59 AM By: Gregory [name] FACS Previous Signature: 09/25/2021 1:58:33 PM Version By: Gregory [name] FACS Entered By: Gregory Maudlin on 09/27/2021 08:40:59 -------------------------------------------------------------------------------- HxROS Details Patient Name: Date of Service: A NDREWS, [name] 09/25/2021 12:30 PM Medical Record Number:  [id number] Patient Account Number: [id number] Date of Birth/Sex: Treating [name]: 27-May-1920 (88 y.o. M) Primary Care Provider: Wylene [name] Other Clinician: Referring Provider: Treating Provider/Extender: Roxy Cedar in Treatment: 20 Information Obtained From Patient Caregiver Cardiovascular Medical History: Positive for: Arrhythmia - A-Fib; Hypertension Past Medical History  Notes: Pacemaker, Complete heart block Gastrointestinal Medical History: Past Medical History Notes: Dysphagia, PEG tube in place Endocrine Medical History: Past Medical History Notes: Hypothyroidism Genitourinary Medical History: Past Medical History Notes: CKD-3 Neurologic Medical History: Past Medical History Notes: Neuromuscular disorder Immunizations Pneumococcal Vaccine: Received Pneumococcal Vaccination: Yes Received Pneumococcal Vaccination On or After 60th Birthday: No Implantable Devices Yes Family and Social History Unknown History: Yes; Never smoker; Marital Status - Widowed; Alcohol Use: Never; Drug Use: No History; Caffeine Use: Never; Financial Concerns: No; Food, Clothing or Shelter Needs: No; Support System Lacking: No; Transportation Concerns: No Electronic Signature(s) Signed: 09/25/2021 3:30:27 PM By: Gregory [name] FACS Entered By: Gregory Maudlin on 09/25/2021 13:41:58 -------------------------------------------------------------------------------- SuperBill Details Patient Name: Date of Service: Gregory [name], [name] 09/25/2021 Medical Record Number: [id number] Patient Account Number: [id number] Date of Birth/Sex: Treating [name]: Mar 08, 1920 (88 y.o. 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 Facility Procedures CPT4 Code: [id number] Description: [facility] -  DEB SUBQ TISSUE 20 SQ CM/< ICD-10 Diagnosis Description L97.512 Non-pressure chronic ulcer of other part of right foot with fat layer exposed Modifier: Quantity: 1 CPT4 Code: [id number] Description: 91638 - DEBRIDE WOUND 1ST 20 SQ CM OR < ICD-10 Diagnosis Description L97.522 Non-pressure chronic ulcer of other part of left foot with fat layer exposed Modifier: Quantity: 1 CPT4 Code: [id number] Description: [place] - DEB MUSC/FASCIA 20 SQ CM/< ICD-10 Diagnosis Description L97.512 Non-pressure chronic ulcer of other part of right foot with fat layer exposed Modifier: Quantity: 1 Physician Procedures : CPT4 Code Description Modifier [id number] 90300 - WC PHYS LEVEL 4 - EST PT 25 ICD-10 Diagnosis Description L97.512 Non-pressure chronic ulcer of other part of right foot with fat layer exposed L97.522 Non-pressure chronic ulcer of other part of left foot  with fat layer exposed I70.263 Atherosclerosis of native arteries of extremities with gangrene, bilateral legs E43 Unspecified severe protein-calorie malnutrition Quantity: 1 : [id number] 62263 - WC PHYS SUBQ TISS 20 SQ CM ICD-10 Diagnosis Description L97.512 Non-pressure chronic ulcer of other part of right foot with fat layer exposed Quantity: 1 : [id number] 56389 - WC PHYS DEBR WO ANESTH 20 SQ CM ICD-10 Diagnosis Description L97.522 Non-pressure chronic ulcer of other part of left foot with fat layer exposed Quantity: 1 : [id number] 68115 - WC PHYS DEBR MUSCLE/FASCIA 20 SQ CM ICD-10 Diagnosis Description L97.512 Non-pressure chronic ulcer of other part of right foot with fat layer exposed Quantity: 1 Electronic Signature(s) Signed: 09/25/2021 2:04:10 PM By: Gregory [name] FACS Entered By: Gregory Maudlin on 09/25/2021 14:04:10

## 2021-09-29 NOTE — Progress Notes (Addendum)
Gregory [name], Gregory [name] ([id number]) Visit Report for 09/25/2021 Arrival Information Details Patient Name: Date of Service: Gregory [name] RNO LD 09/25/2021 12:30 PM Medical Record Number: [id number] Patient Account Number: [id number] Date of Birth/Sex: Treating RN: [birth year] (88 y.o. [name] Primary Care Amea Mcphail: Wylene Simmer Other Clinician: Referring Camisha Srey: Treating Andra Matsuo/Extender: Roxy Cedar in Treatment: 20 Visit Information History Since Last Visit Added or deleted any medications: No Patient Arrived: Wheel Chair Any new allergies or adverse reactions: No Arrival Time: 12:52 Had Gregory [name] fall or experienced change in No Accompanied By: daughter activities of daily living that may affect Transfer Assistance: Manual risk of falls: Patient Requires Transmission-Based Precautions: No Signs or symptoms of abuse/neglect since last visito No Patient Has Alerts: Yes Hospitalized since last visit: No Patient Alerts: R ABI: [place] TBI: 0.18 Implantable device outside of the clinic excluding No L ABI: [place] TBI: 0.0 cellular tissue based products placed in the center since last visit: Has Dressing in Place as Prescribed: Yes Pain Present Now: No Electronic Signature(s) Signed: 09/25/2021 4:53:36 PM By: Dellie Catholic RN Entered By: Dellie Catholic on 09/25/2021 12:53:05 -------------------------------------------------------------------------------- Encounter Discharge Information Details Patient Name: Date of Service: Gregory [name], Gregory [name] RNO LD 09/25/2021 12:30 PM Medical Record Number: [id number] Patient Account Number: [id number] Date of Birth/Sex: Treating RN: 28-Dec-1920 (88 y.o. [name] Primary Care Carlea Badour: Wylene Simmer Other Clinician: Referring June Vacha: Treating Bishoy Cupp/Extender: Roxy Cedar in Treatment: 20 Encounter Discharge Information Items Post Procedure Vitals Discharge Condition:  Stable Temperature (F): 97.5 Ambulatory Status: Wheelchair Pulse (bpm): 71 Discharge Destination: Home Respiratory Rate (breaths/min): 16 Transportation: Private Auto Blood Pressure (mmHg): 117/69 Accompanied By: daughter Schedule Follow-up Appointment: Yes Clinical Summary of Care: Patient Declined Electronic Signature(s) Signed: 09/25/2021 4:53:36 PM By: Dellie Catholic RN Entered By: Dellie Catholic on 09/25/2021 16:51:39 -------------------------------------------------------------------------------- Lower Extremity Assessment Details Patient Name: Date of Service: Gregory [name] RNO LD 09/25/2021 12:30 PM Medical Record Number: [id number] Patient Account Number: [id number] Date of Birth/Sex: Treating RN: December 03, 1920 (88 y.o. [name] Primary Care Kanae Ignatowski: Wylene Simmer Other Clinician: Referring Jemima Petko: Treating Euleta Belson/Extender: Roxy Cedar in Treatment: 20 Edema Assessment Assessed: [Left: No] [Right: No] E[Left: dema] [Right: :] Calf Left: Right: Point of Measurement: From Medial Instep 30 cm 30.5 cm Ankle Left: Right: Point of Measurement: From Medial Instep 20 cm 20 cm Electronic Signature(s) Signed: 09/25/2021 4:53:36 PM By: Dellie Catholic RN Entered By: Dellie Catholic on 09/25/2021 12:53:46 -------------------------------------------------------------------------------- Multi Wound Chart Details Patient Name: Date of Service: Gregory [name], Gregory [name] RNO LD 09/25/2021 12:30 PM Medical Record Number: [id number] Patient Account Number: [id number] Date of Birth/Sex: Treating RN: [birth year] (88 y.o. M) Primary Care Kiyan Burmester: Wylene Simmer Other Clinician: Referring Mairen Wallenstein: Treating Kenny Stern/Extender: Roxy Cedar in Treatment: 20 Vital Signs Height(in): Pulse(bpm): 72 Weight(lbs): Blood Pressure(mmHg): 117/69 Body Mass Index(BMI): Temperature(F): 97.5 Respiratory Rate(breaths/min):  16 Photos: Right T Great oe Left T Great oe Right, Lateral Foot Wound Location: Pressure Injury Pressure Injury Gradually Appeared Wounding Event: Arterial Insufficiency Ulcer Arterial Insufficiency Ulcer Pressure Ulcer Primary Etiology: Arrhythmia, Hypertension Arrhythmia, Hypertension Arrhythmia, Hypertension Comorbid History: 02/12/2021 02/12/2021 04/12/2021 Date Acquired: '20 20 15 '$ Weeks of Treatment: Open Healed - Epithelialized Open Wound Status: No No No Wound Recurrence: 0.4x0.7x0.1 0x0x0 1.4x1x0.1 Measurements L x W x D (cm) 0.22 0 1.1 Gregory [name] (cm) : rea 0.022 0 0.11 Volume (cm) : 95.30% 100.00% -2240.40% % Reduction in Area: 95.30% 100.00% -2100.00% % Reduction in Volume: Full Thickness Without Exposed Unclassifiable  Category/Stage II Classification: Support Structures Medium None Present Small Exudate Gregory [name] mount: Serosanguineous N/Gregory [name] Serosanguineous Exudate Type: red, brown N/Gregory [name] red, brown Exudate Color: Flat and Intact Flat and Intact Flat and Intact Wound Margin: Small (1-33%) None Present (0%) Small (1-33%) Granulation Gregory [name] mount: Red N/Gregory [name] Pink Granulation Quality: Large (67-100%) None Present (0%) Large (67-100%) Necrotic Gregory [name] mount: Eschar, Adherent Slough N/Gregory [name] Eschar, Adherent Slough Necrotic Tissue: Fat Layer (Subcutaneous Tissue): Yes Fascia: No Fat Layer (Subcutaneous Tissue): Yes Exposed Structures: Fascia: No Fat Layer (Subcutaneous Tissue): No Fascia: No Tendon: No Tendon: No Tendon: No Muscle: No Muscle: No Muscle: No Joint: No Joint: No Joint: No Bone: No Bone: No Bone: No Large (67-100%) Large (67-100%) Small (1-33%) Epithelialization: Debridement - Excisional Debridement - Selective/Open Wound Debridement - Excisional Debridement: Pre-procedure Verification/Time Out 13:20 13:20 13:20 Taken: Lidocaine 4% Topical Solution Lidocaine 4% Topical Solution Lidocaine 4% Topical Solution Pain Control: Necrotic/Eschar, Subcutaneous, Necrotic/Eschar  Necrotic/Eschar, Subcutaneous, Tissue Debrided: [employer] Skin/Subcutaneous Tissue Non-Viable Tissue Skin/Subcutaneous Tissue Level: 0.28 0.24 1.4 Debridement Gregory [name] (sq cm): rea Curette Curette Curette Instrument: Minimum Minimum Minimum Bleeding: Pressure Pressure Pressure Hemostasis Achieved: 0 0 0 Procedural Pain: 0 0 0 Post Procedural Pain: Debridement Treatment Response: Procedure was tolerated well Procedure was tolerated well Procedure was tolerated well Post Debridement Measurements L x 0.4x0.7x0.1 0.4x0.6x0.1 1.4x1x0.1 W x D (cm) 0.022 0.019 0.11 Post Debridement Volume: (cm) N/Gregory [name] N/Gregory [name] Category/Stage II Post Debridement Stage: Debridement Debridement Debridement Procedures Performed: Wound Number: 5 7 N/Gregory [name] Photos: N/Gregory [name] Right, Medial T Great oe Left, Lateral Foot N/Gregory [name] Wound Location: Blister Gradually Appeared N/Gregory [name] Wounding Event: Pressure Ulcer Pressure Ulcer N/Gregory [name] Primary Etiology: Arrhythmia, Hypertension Arrhythmia, Hypertension N/Gregory [name] Comorbid History: 06/12/2021 08/31/2021 N/Gregory [name] Date Acquired: 13 3 N/Gregory [name] Weeks of Treatment: Open Healed - Epithelialized N/Gregory [name] Wound Status: No No N/Gregory [name] Wound Recurrence: 1.8x1.1x0 0x0x0 N/Gregory [name] Measurements L x W x D (cm) 1.555 0 N/Gregory [name] Gregory [name] (cm) : rea 0.156 0 N/Gregory [name] Volume (cm) : -2090.10% 100.00% N/Gregory [name] % Reduction in Gregory [name] rea: -2128.60% 100.00% N/Gregory [name] % Reduction in Volume: Category/Stage II Category/Stage II N/Gregory [name] Classification: Medium None Present N/Gregory [name] Exudate Gregory [name] mount: Serosanguineous N/Gregory [name] N/Gregory [name] Exudate Type: red, brown N/Gregory [name] N/Gregory [name] Exudate Color: N/Gregory [name] N/Gregory [name] N/Gregory [name] Wound Margin: Small (1-33%) None Present (0%) N/Gregory [name] Granulation Gregory [name] mount: Red N/Gregory [name] N/Gregory [name] Granulation Quality: Large (67-100%) None Present (0%) N/Gregory [name] Necrotic Gregory [name] mount: Eschar, Adherent Slough N/Gregory [name] N/Gregory [name] Necrotic Tissue: Fat Layer (Subcutaneous Tissue): Yes Fascia: No N/Gregory [name] Exposed Structures: Fascia: No Fat Layer (Subcutaneous Tissue): No Tendon: No Tendon: No Muscle: No Muscle: No Joint:  No Joint: No Bone: No Bone: No None Large (67-100%) N/Gregory [name] Epithelialization: Debridement - Excisional Debridement - Selective/Open Wound N/Gregory [name] Debridement: Pre-procedure Verification/Time Out 13:20 13:20 N/Gregory [name] Taken: Lidocaine 4% Topical Solution Lidocaine 4% Topical Solution N/Gregory [name] Pain Control: Necrotic/Eschar, Subcutaneous, Necrotic/Eschar N/Gregory [name] Tissue Debrided: Slough Skin/Subcutaneous Tissue Non-Viable Tissue N/Gregory [name] Level: 1.98 0.25 N/Gregory [name] Debridement Gregory [name] (sq cm): rea Curette Curette N/Gregory [name] Instrument: Minimum Minimum N/Gregory [name] Bleeding: Pressure Pressure N/Gregory [name] Hemostasis Achieved: 0 0 N/Gregory [name] Procedural Pain: 0 0 N/Gregory [name] Post Procedural Pain: Procedure was tolerated well Procedure was tolerated well N/Gregory [name] Debridement Treatment Response: 1.8x1.1x0.1 0.5x0.5x0.1 N/Gregory [name] Post Debridement Measurements L x W x D (cm) 0.156 0.02 N/Gregory [name] Post Debridement Volume: (cm) Category/Stage II Category/Stage II N/Gregory [name] Post Debridement Stage: Debridement Debridement N/Gregory [name] Procedures Performed: Treatment Notes Electronic Signature(s) Signed: 09/25/2021 1:39:31 PM By: Fredirick Maudlin MD FACS Entered By: Fredirick Maudlin on 09/25/2021 13:39:31 -------------------------------------------------------------------------------- Multi-Disciplinary Care Plan Details Patient Name: Date of Service: Gregory [name], Gregory [name] RNO LD 09/25/2021 12:30  PM Medical Record Number: [id number] Patient Account Number: [id number] Date of Birth/Sex: Treating RN: 11-29-1920 (88 y.o. [name] Primary Care Aleksandr Pellow: Wylene Simmer Other Clinician: Referring Giani Winther: Treating Pammie Chirino/Extender: Roxy Cedar in Treatment: [place] reviewed with physician Active Inactive Electronic Signature(s) Signed: 09/29/2021 4:00:02 PM By: Adline Peals Signed: 09/29/2021 5:30:22 PM By: Dellie Catholic RN Previous Signature: 09/25/2021 4:53:36 PM Version By: Dellie Catholic RN Entered By: Adline Peals  on 09/29/2021 10:16:57 -------------------------------------------------------------------------------- Pain Assessment Details Patient Name: Date of Service: Gregory [name], Gregory [name] RNO LD 09/25/2021 12:30 PM Medical Record Number: [id number] Patient Account Number: [id number] Date of Birth/Sex: Treating RN: [birth year] (88 y.o. [name] Primary Care Blanchard Willhite: Wylene Simmer Other Clinician: Referring Tremont Gavitt: Treating Makensey Rego/Extender: Roxy Cedar in Treatment: 20 Active Problems Location of Pain Severity and Description of Pain Patient Has Paino No Site Locations Pain Management and Medication Current Pain Management: Electronic Signature(s) Signed: 09/25/2021 4:53:36 PM By: Dellie Catholic RN Entered By: Dellie Catholic on 09/25/2021 12:53:41 -------------------------------------------------------------------------------- Patient/Caregiver Education Details Patient Name: Date of Service: Gregory [name] LD 8/14/2023andnbsp12:30 PM Medical Record Number: [id number] Patient Account Number: [id number] Date of Birth/Gender: Treating RN: 04-14-1920 (88 y.o. [name] Primary Care Physician: Wylene Simmer Other Clinician: Referring Physician: Treating Physician/Extender: Roxy Cedar in Treatment: 20 Education Assessment Education Provided To: Patient Education Topics Provided Wound/Skin Impairment: Methods: Explain/Verbal Responses: Return demonstration correctly Electronic Signature(s) Signed: 09/25/2021 4:53:36 PM By: Dellie Catholic RN Entered By: Dellie Catholic on 09/25/2021 16:50:16 -------------------------------------------------------------------------------- Wound Assessment Details Patient Name: Date of Service: Gregory [name] RNO LD 09/25/2021 12:30 PM Medical Record Number: [id number] Patient Account Number: [id number] Date of Birth/Sex: Treating RN: Jan 16, 1921 (88 y.o. [name] Primary Care Saamiya Jeppsen: Wylene Simmer Other Clinician: Referring Hans Rusher: Treating Keeanna Villafranca/Extender: Roxy Cedar in Treatment: 20 Wound Status Wound Number: 1 Primary Etiology: Arterial Insufficiency Ulcer Wound Location: Right T Great oe Wound Status: Open Wounding Event: Pressure Injury Comorbid History: Arrhythmia, Hypertension Date Acquired: 02/12/2021 Weeks Of Treatment: 20 Clustered Wound: No Photos Wound Measurements Length: (cm) 0.4 Width: (cm) 0.7 Depth: (cm) 0.1 Area: (cm) 0.22 Volume: (cm) 0.022 % Reduction in Area: 95.3% % Reduction in Volume: 95.3% Epithelialization: Large (67-100%) Tunneling: No Undermining: No Wound Description Classification: Full Thickness Without Exposed Support Structures Wound Margin: Flat and Intact Exudate Amount: Medium Exudate Type: Serosanguineous Exudate Color: red, brown Foul Odor After Cleansing: No Slough/Fibrino Yes Wound Bed Granulation Amount: Small (1-33%) Exposed Structure Granulation Quality: Red Fascia Exposed: No Necrotic Amount: Large (67-100%) Fat Layer (Subcutaneous Tissue) Exposed: Yes Necrotic Quality: Eschar, Adherent Slough Tendon Exposed: No Muscle Exposed: No Joint Exposed: No Bone Exposed: No Electronic Signature(s) Signed: 09/25/2021 4:53:36 PM By: Dellie Catholic RN Entered By: Dellie Catholic on 09/25/2021 13:06:55 -------------------------------------------------------------------------------- Wound Assessment Details Patient Name: Date of Service: Gregory [name] RNO LD 09/25/2021 12:30 PM Medical Record Number: [id number] Patient Account Number: [id number] Date of Birth/Sex: Treating RN: 19-Apr-1920 (88 y.o. [name] Primary Care Roen Macgowan: Wylene Simmer Other Clinician: Referring Rowyn Mustapha: Treating Graceanne Guin/Extender: Roxy Cedar in Treatment: 20 Wound Status Wound Number: 2 Primary Etiology: Arterial  Insufficiency Ulcer Wound Location: Left T Great oe Wound Status: Healed - Epithelialized Wounding Event: Pressure Injury Comorbid History: Arrhythmia, Hypertension Date Acquired: 02/12/2021 Weeks Of Treatment: 20 Clustered Wound: No Photos Wound Measurements Length: (cm) Width: (cm) Depth: (cm) Area: (cm) Volume: (cm) 0 % Reduction in Area: 100% 0 % Reduction in  Volume: 100% 0 Epithelialization: Large (67-100%) 0 Tunneling: No 0 Undermining: No Wound Description Classification: Unclassifiable Wound Margin: Flat and Intact Exudate Amount: None Present Foul Odor After Cleansing: No Slough/Fibrino No Wound Bed Granulation Amount: None Present (0%) Exposed Structure Necrotic Amount: None Present (0%) Fascia Exposed: No Fat Layer (Subcutaneous Tissue) Exposed: No Tendon Exposed: No Muscle Exposed: No Joint Exposed: No Bone Exposed: No Electronic Signature(s) Signed: 09/25/2021 4:53:36 PM By: Dellie Catholic RN Entered By: Dellie Catholic on 09/25/2021 13:25:13 -------------------------------------------------------------------------------- Wound Assessment Details Patient Name: Date of Service: Gregory [name] RNO LD 09/25/2021 12:30 PM Medical Record Number: [id number] Patient Account Number: [id number] Date of Birth/Sex: Treating RN: September 03, 1920 (88 y.o. [name] Primary Care Ahad Colarusso: Wylene Simmer Other Clinician: Referring Kalisa Girtman: Treating Brayln Duque/Extender: Roxy Cedar in Treatment: 20 Wound Status Wound Number: 4 Primary Etiology: Pressure Ulcer Wound Location: Right, Lateral Foot Wound Status: Open Wounding Event: Gradually Appeared Comorbid History: Arrhythmia, Hypertension Date Acquired: 04/12/2021 Weeks Of Treatment: 15 Clustered Wound: No Photos Wound Measurements Length: (cm) 1.4 Width: (cm) 1 Depth: (cm) 0.1 Area: (cm) 1.1 Volume: (cm) 0.11 % Reduction in Area: -2240.4% % Reduction in Volume:  -2100% Epithelialization: Small (1-33%) Tunneling: No Undermining: No Wound Description Classification: Category/Stage II Wound Margin: Flat and Intact Exudate Amount: Small Exudate Type: Serosanguineous Exudate Color: red, brown Foul Odor After Cleansing: No Slough/Fibrino Yes Wound Bed Granulation Amount: Small (1-33%) Exposed Structure Granulation Quality: Pink Fascia Exposed: No Necrotic Amount: Large (67-100%) Fat Layer (Subcutaneous Tissue) Exposed: Yes Necrotic Quality: Eschar, Adherent Slough Tendon Exposed: No Muscle Exposed: No Joint Exposed: No Bone Exposed: No Electronic Signature(s) Signed: 09/25/2021 4:53:36 PM By: Dellie Catholic RN Entered By: Dellie Catholic on 09/25/2021 13:05:53 -------------------------------------------------------------------------------- Wound Assessment Details Patient Name: Date of Service: Gregory [name] RNO LD 09/25/2021 12:30 PM Medical Record Number: [id number] Patient Account Number: [id number] Date of Birth/Sex: Treating RN: Mar 10, 1920 (88 y.o. [name] Primary Care Kyran Connaughton: Wylene Simmer Other Clinician: Referring Mykeisha Dysert: Treating Lavere Shinsky/Extender: Roxy Cedar in Treatment: 20 Wound Status Wound Number: 5 Primary Etiology: Pressure Ulcer Wound Location: Right, Medial T Great oe Wound Status: Open Wounding Event: Blister Comorbid History: Arrhythmia, Hypertension Date Acquired: 06/12/2021 Weeks Of Treatment: 13 Clustered Wound: No Photos Wound Measurements Length: (cm) 1.8 Width: (cm) 1.1 Depth: (cm) 0 Area: (cm) 1.555 Volume: (cm) 0.156 % Reduction in Area: -2090.1% % Reduction in Volume: -2128.6% Epithelialization: None Tunneling: No Undermining: No Wound Description Classification: Category/Stage II Exudate Amount: Medium Exudate Type: Serosanguineous Exudate Color: red, brown Foul Odor After Cleansing: No Slough/Fibrino Yes Wound Bed Granulation Amount:  Small (1-33%) Exposed Structure Granulation Quality: Red Fascia Exposed: No Necrotic Amount: Large (67-100%) Fat Layer (Subcutaneous Tissue) Exposed: Yes Necrotic Quality: Eschar, Adherent Slough Tendon Exposed: No Muscle Exposed: No Joint Exposed: No Bone Exposed: No Electronic Signature(s) Signed: 09/25/2021 4:53:36 PM By: Dellie Catholic RN Entered By: Dellie Catholic on 09/25/2021 13:05:24 -------------------------------------------------------------------------------- Wound Assessment Details Patient Name: Date of Service: Gregory [name], Gregory [name] RNO LD 09/25/2021 12:30 PM Medical Record Number: [id number] Patient Account Number: [id number] Date of Birth/Sex: Treating RN: [birth year] (88 y.o. [name] Primary Care Dominik Yordy: Wylene Simmer Other Clinician: Referring Meline Russaw: Treating Karima Carrell/Extender: Roxy Cedar in Treatment: 20 Wound Status Wound Number: 7 Primary Etiology: Pressure Ulcer Wound Location: Left, Lateral Foot Wound Status: Healed - Epithelialized Wounding Event: Gradually Appeared Comorbid History: Arrhythmia, Hypertension Date Acquired: 08/31/2021 Weeks Of Treatment: 3 Clustered Wound: No Photos Wound Measurements Length: (cm) Width: (cm)  Depth: (cm) Area: (cm) Volume: (cm) 0 % Reduction in Area: 100% 0 % Reduction in Volume: 100% 0 Epithelialization: Large (67-100%) 0 Tunneling: No 0 Undermining: No Wound Description Classification: Category/Stage II Exudate Amount: None Present Foul Odor After Cleansing: No Slough/Fibrino No Wound Bed Granulation Amount: None Present (0%) Exposed Structure Necrotic Amount: None Present (0%) Fascia Exposed: No Fat Layer (Subcutaneous Tissue) Exposed: No Tendon Exposed: No Muscle Exposed: No Joint Exposed: No Bone Exposed: No Electronic Signature(s) Signed: 09/25/2021 4:53:36 PM By: Dellie Catholic RN Entered By: Dellie Catholic on 09/25/2021  13:26:49 -------------------------------------------------------------------------------- [place] Details Patient Name: Date of Service: Gregory [name], Gregory [name] RNO LD 09/25/2021 12:30 PM Medical Record Number: [id number] Patient Account Number: [id number] Date of Birth/Sex: Treating RN: 22-Mar-1920 (88 y.o. [name] Primary Care Jerrel Tiberio: Wylene Simmer Other Clinician: Referring Burk Hoctor: Treating Trayvon Trumbull/Extender: Roxy Cedar in Treatment: 20 Vital Signs Time Taken: 12:53 Temperature (F): 97.5 Pulse (bpm): 71 Respiratory Rate (breaths/min): 16 Blood Pressure (mmHg): 117/69 Reference Range: 80 - 120 mg / dl Electronic Signature(s) Signed: 09/25/2021 4:53:36 PM By: Dellie Catholic RN Entered By: Dellie Catholic on 09/25/2021 12:53:32

## 2021-10-03 ENCOUNTER — Ambulatory Visit (INDEPENDENT_AMBULATORY_CARE_PROVIDER_SITE_OTHER)

## 2021-10-03 DIAGNOSIS — I442 Atrioventricular block, complete: Secondary | ICD-10-CM | POA: Diagnosis not present

## 2021-10-03 LAB — CUP PACEART REMOTE DEVICE CHECK
Battery Remaining Longevity: 105 mo
Battery Voltage: 3.01 V
Brady Statistic RV Percent Paced: 99.8 %
Date Time Interrogation Session: 20230822020545
Implantable Lead Implant Date: 20021114
Implantable Lead Location: 753860
Implantable Lead Model: 5076
Implantable Pulse Generator Implant Date: 20200506
Lead Channel Impedance Value: 342 Ohm
Lead Channel Impedance Value: 399 Ohm
Lead Channel Pacing Threshold Amplitude: 1.375 V
Lead Channel Pacing Threshold Pulse Width: 0.4 ms
Lead Channel Sensing Intrinsic Amplitude: 11.25 mV
Lead Channel Sensing Intrinsic Amplitude: 11.25 mV
Lead Channel Setting Pacing Amplitude: 2.25 V
Lead Channel Setting Pacing Pulse Width: 0.4 ms
Lead Channel Setting Sensing Sensitivity: 0.9 mV

## 2021-10-14 ENCOUNTER — Other Ambulatory Visit: Payer: Self-pay

## 2021-10-14 ENCOUNTER — Observation Stay (HOSPITAL_COMMUNITY)

## 2021-10-14 ENCOUNTER — Encounter (HOSPITAL_COMMUNITY): Payer: Self-pay | Admitting: Emergency Medicine

## 2021-10-14 ENCOUNTER — Inpatient Hospital Stay (HOSPITAL_COMMUNITY)
Admission: EM | Admit: 2021-10-14 | Discharge: 2021-10-20 | DRG: 682 | Disposition: A | Discharge status: Hospice | Attending: Internal Medicine | Admitting: Internal Medicine

## 2021-10-14 ENCOUNTER — Emergency Department (HOSPITAL_COMMUNITY)

## 2021-10-14 DIAGNOSIS — E039 Hypothyroidism, unspecified: Secondary | ICD-10-CM | POA: Diagnosis present

## 2021-10-14 DIAGNOSIS — N281 Cyst of kidney, acquired: Secondary | ICD-10-CM

## 2021-10-14 DIAGNOSIS — T68XXXA Hypothermia, initial encounter: Secondary | ICD-10-CM

## 2021-10-14 DIAGNOSIS — R54 Age-related physical debility: Secondary | ICD-10-CM | POA: Diagnosis present

## 2021-10-14 DIAGNOSIS — L89891 Pressure ulcer of other site, stage 1: Secondary | ICD-10-CM

## 2021-10-14 DIAGNOSIS — E46 Unspecified protein-calorie malnutrition: Secondary | ICD-10-CM | POA: Diagnosis present

## 2021-10-14 DIAGNOSIS — I10 Essential (primary) hypertension: Secondary | ICD-10-CM | POA: Diagnosis not present

## 2021-10-14 DIAGNOSIS — D649 Anemia, unspecified: Secondary | ICD-10-CM

## 2021-10-14 DIAGNOSIS — N179 Acute kidney failure, unspecified: Secondary | ICD-10-CM | POA: Diagnosis not present

## 2021-10-14 DIAGNOSIS — Z7401 Bed confinement status: Secondary | ICD-10-CM

## 2021-10-14 DIAGNOSIS — Z881 Allergy status to other antibiotic agents status: Secondary | ICD-10-CM

## 2021-10-14 DIAGNOSIS — N1831 Chronic kidney disease, stage 3a: Secondary | ICD-10-CM | POA: Diagnosis present

## 2021-10-14 DIAGNOSIS — Z8249 Family history of ischemic heart disease and other diseases of the circulatory system: Secondary | ICD-10-CM

## 2021-10-14 DIAGNOSIS — E871 Hypo-osmolality and hyponatremia: Secondary | ICD-10-CM | POA: Diagnosis present

## 2021-10-14 DIAGNOSIS — I739 Peripheral vascular disease, unspecified: Secondary | ICD-10-CM | POA: Diagnosis present

## 2021-10-14 DIAGNOSIS — Z880 Allergy status to penicillin: Secondary | ICD-10-CM

## 2021-10-14 DIAGNOSIS — M7022 Olecranon bursitis, left elbow: Secondary | ICD-10-CM | POA: Diagnosis present

## 2021-10-14 DIAGNOSIS — I480 Paroxysmal atrial fibrillation: Secondary | ICD-10-CM

## 2021-10-14 DIAGNOSIS — E870 Hyperosmolality and hypernatremia: Secondary | ICD-10-CM | POA: Diagnosis present

## 2021-10-14 DIAGNOSIS — I130 Hypertensive heart and chronic kidney disease with heart failure and stage 1 through stage 4 chronic kidney disease, or unspecified chronic kidney disease: Secondary | ICD-10-CM | POA: Diagnosis present

## 2021-10-14 DIAGNOSIS — I2510 Atherosclerotic heart disease of native coronary artery without angina pectoris: Secondary | ICD-10-CM | POA: Diagnosis present

## 2021-10-14 DIAGNOSIS — Z952 Presence of prosthetic heart valve: Secondary | ICD-10-CM

## 2021-10-14 DIAGNOSIS — Z888 Allergy status to other drugs, medicaments and biological substances status: Secondary | ICD-10-CM

## 2021-10-14 DIAGNOSIS — Z950 Presence of cardiac pacemaker: Secondary | ICD-10-CM

## 2021-10-14 DIAGNOSIS — Z66 Do not resuscitate: Secondary | ICD-10-CM | POA: Diagnosis present

## 2021-10-14 DIAGNOSIS — R509 Fever, unspecified: Secondary | ICD-10-CM | POA: Diagnosis not present

## 2021-10-14 DIAGNOSIS — R627 Adult failure to thrive: Secondary | ICD-10-CM | POA: Diagnosis present

## 2021-10-14 DIAGNOSIS — E785 Hyperlipidemia, unspecified: Secondary | ICD-10-CM | POA: Diagnosis present

## 2021-10-14 DIAGNOSIS — J9611 Chronic respiratory failure with hypoxia: Secondary | ICD-10-CM | POA: Diagnosis present

## 2021-10-14 DIAGNOSIS — L8990 Pressure ulcer of unspecified site, unspecified stage: Secondary | ICD-10-CM

## 2021-10-14 DIAGNOSIS — Z931 Gastrostomy status: Secondary | ICD-10-CM

## 2021-10-14 DIAGNOSIS — Z6820 Body mass index (BMI) 20.0-20.9, adult: Secondary | ICD-10-CM

## 2021-10-14 DIAGNOSIS — I442 Atrioventricular block, complete: Secondary | ICD-10-CM | POA: Diagnosis present

## 2021-10-14 DIAGNOSIS — I4821 Permanent atrial fibrillation: Secondary | ICD-10-CM | POA: Diagnosis present

## 2021-10-14 DIAGNOSIS — R1312 Dysphagia, oropharyngeal phase: Secondary | ICD-10-CM

## 2021-10-14 DIAGNOSIS — E860 Dehydration: Secondary | ICD-10-CM | POA: Diagnosis present

## 2021-10-14 DIAGNOSIS — R680 Hypothermia, not associated with low environmental temperature: Secondary | ICD-10-CM | POA: Diagnosis present

## 2021-10-14 DIAGNOSIS — J9811 Atelectasis: Secondary | ICD-10-CM | POA: Diagnosis present

## 2021-10-14 DIAGNOSIS — I5032 Chronic diastolic (congestive) heart failure: Secondary | ICD-10-CM | POA: Diagnosis present

## 2021-10-14 DIAGNOSIS — Z85828 Personal history of other malignant neoplasm of skin: Secondary | ICD-10-CM

## 2021-10-14 DIAGNOSIS — Z79890 Hormone replacement therapy (postmenopausal): Secondary | ICD-10-CM

## 2021-10-14 DIAGNOSIS — G9341 Metabolic encephalopathy: Secondary | ICD-10-CM | POA: Diagnosis present

## 2021-10-14 DIAGNOSIS — Z79899 Other long term (current) drug therapy: Secondary | ICD-10-CM

## 2021-10-14 DIAGNOSIS — N189 Chronic kidney disease, unspecified: Secondary | ICD-10-CM

## 2021-10-14 DIAGNOSIS — Z9981 Dependence on supplemental oxygen: Secondary | ICD-10-CM

## 2021-10-14 DIAGNOSIS — Z806 Family history of leukemia: Secondary | ICD-10-CM

## 2021-10-14 DIAGNOSIS — E43 Unspecified severe protein-calorie malnutrition: Secondary | ICD-10-CM | POA: Diagnosis present

## 2021-10-14 DIAGNOSIS — R1310 Dysphagia, unspecified: Secondary | ICD-10-CM | POA: Diagnosis present

## 2021-10-14 DIAGNOSIS — Z781 Physical restraint status: Secondary | ICD-10-CM

## 2021-10-14 DIAGNOSIS — E876 Hypokalemia: Secondary | ICD-10-CM | POA: Diagnosis present

## 2021-10-14 DIAGNOSIS — Z20822 Contact with and (suspected) exposure to covid-19: Secondary | ICD-10-CM | POA: Diagnosis present

## 2021-10-14 DIAGNOSIS — Z7902 Long term (current) use of antithrombotics/antiplatelets: Secondary | ICD-10-CM

## 2021-10-14 DIAGNOSIS — J90 Pleural effusion, not elsewhere classified: Secondary | ICD-10-CM

## 2021-10-14 DIAGNOSIS — D631 Anemia in chronic kidney disease: Secondary | ICD-10-CM | POA: Diagnosis present

## 2021-10-14 LAB — BASIC METABOLIC PANEL
Anion gap: 13 (ref 5–15)
Anion gap: 14 (ref 5–15)
BUN: 162 mg/dL — ABNORMAL HIGH (ref 8–23)
BUN: 159 mg/dL — ABNORMAL HIGH (ref 8–23)
CO2: 26 mmol/L (ref 22–32)
CO2: 27 mmol/L (ref 22–32)
Calcium: 9.4 mg/dL (ref 8.9–10.3)
Calcium: 9.7 mg/dL (ref 8.9–10.3)
Chloride: 107 mmol/L (ref 98–111)
Chloride: 106 mmol/L (ref 98–111)
Creatinine, Ser: 2.52 mg/dL — ABNORMAL HIGH (ref 0.61–1.24)
Creatinine, Ser: 2.53 mg/dL — ABNORMAL HIGH (ref 0.61–1.24)
GFR, Estimated: 22 mL/min — ABNORMAL LOW (ref >60)
GFR, Estimated: 22 mL/min — ABNORMAL LOW (ref >60)
Glucose, Bld: 106 mg/dL — ABNORMAL HIGH (ref 70–99)
Glucose, Bld: 115 mg/dL — ABNORMAL HIGH (ref 70–99)
Potassium: 3.3 mmol/L — ABNORMAL LOW (ref 3.5–5.1)
Potassium: 3.5 mmol/L (ref 3.5–5.1)
Sodium: 146 mmol/L — ABNORMAL HIGH (ref 135–145)
Sodium: 147 mmol/L — ABNORMAL HIGH (ref 135–145)

## 2021-10-14 LAB — URINALYSIS, ROUTINE W REFLEX MICROSCOPIC
Bilirubin Urine: NEGATIVE
Glucose, UA: NEGATIVE mg/dL
Hgb urine dipstick: NEGATIVE
Ketones, ur: NEGATIVE mg/dL
Leukocytes,Ua: NEGATIVE
Nitrite: NEGATIVE
Protein, ur: NEGATIVE mg/dL
Specific Gravity, Urine: 1.012 (ref 1.005–1.030)
pH: 5 (ref 5.0–8.0)

## 2021-10-14 LAB — CBC WITH DIFFERENTIAL/PLATELET
Abs Immature Granulocytes: 0.04 10*3/uL (ref 0.00–0.07)
Basophils Absolute: 0 10*3/uL (ref 0.0–0.1)
Basophils Relative: 0 %
Eosinophils Absolute: 0.1 10*3/uL (ref 0.0–0.5)
Eosinophils Relative: 1 %
HCT: 23.7 % — ABNORMAL LOW (ref 39.0–52.0)
Hemoglobin: 7.4 g/dL — ABNORMAL LOW (ref 13.0–17.0)
Immature Granulocytes: 1 %
Lymphocytes Relative: 8 %
Lymphs Abs: 0.6 10*3/uL — ABNORMAL LOW (ref 0.7–4.0)
MCH: 32.2 pg (ref 26.0–34.0)
MCHC: 31.2 g/dL (ref 30.0–36.0)
MCV: 103 fL — ABNORMAL HIGH (ref 80.0–100.0)
Monocytes Absolute: 0.8 10*3/uL (ref 0.1–1.0)
Monocytes Relative: 11 %
Neutro Abs: 6.1 10*3/uL (ref 1.7–7.7)
Neutrophils Relative %: 79 %
Platelets: 102 10*3/uL — ABNORMAL LOW (ref 150–400)
RBC: 2.3 MIL/uL — ABNORMAL LOW (ref 4.22–5.81)
RDW: 18.2 % — ABNORMAL HIGH (ref 11.5–15.5)
WBC: 7.7 10*3/uL (ref 4.0–10.5)
nRBC: 0 % (ref 0.0–0.2)

## 2021-10-14 LAB — COMPREHENSIVE METABOLIC PANEL
ALT: 25 U/L (ref 0–44)
AST: 36 U/L (ref 15–41)
Albumin: 3.8 g/dL (ref 3.5–5.0)
Alkaline Phosphatase: 88 U/L (ref 38–126)
Anion gap: 12 (ref 5–15)
BUN: 166 mg/dL — ABNORMAL HIGH (ref 8–23)
CO2: 28 mmol/L (ref 22–32)
Calcium: 9.6 mg/dL (ref 8.9–10.3)
Chloride: 104 mmol/L (ref 98–111)
Creatinine, Ser: 2.49 mg/dL — ABNORMAL HIGH (ref 0.61–1.24)
GFR, Estimated: 23 mL/min — ABNORMAL LOW (ref >60)
Glucose, Bld: 140 mg/dL — ABNORMAL HIGH (ref 70–99)
Potassium: 3.3 mmol/L — ABNORMAL LOW (ref 3.5–5.1)
Sodium: 144 mmol/L (ref 135–145)
Total Bilirubin: 0.8 mg/dL (ref 0.3–1.2)
Total Protein: 7.9 g/dL (ref 6.5–8.1)

## 2021-10-14 LAB — LACTIC ACID, PLASMA
Lactic Acid, Venous: 1.4 mmol/L (ref 0.5–1.9)
Lactic Acid, Venous: 0.9 mmol/L (ref 0.5–1.9)

## 2021-10-14 LAB — T4, FREE: Free T4: 0.98 ng/dL (ref 0.61–1.12)

## 2021-10-14 LAB — FOLATE: Folate: >40 ng/mL (ref >5.9)

## 2021-10-14 LAB — TSH: TSH: 6.109 u[IU]/mL — ABNORMAL HIGH (ref 0.350–4.500)

## 2021-10-14 LAB — TROPONIN I (HIGH SENSITIVITY)
Troponin I (High Sensitivity): 87 ng/L — ABNORMAL HIGH (ref <18)
Troponin I (High Sensitivity): 89 ng/L — ABNORMAL HIGH (ref <18)

## 2021-10-14 LAB — RESP PANEL BY RT-PCR (FLU A&B, COVID) ARPGX2
Influenza A by PCR: NEGATIVE
Influenza B by PCR: NEGATIVE
SARS Coronavirus 2 by RT PCR: NEGATIVE

## 2021-10-14 LAB — BRAIN NATRIURETIC PEPTIDE: B Natriuretic Peptide: 685 pg/mL — ABNORMAL HIGH (ref 0.0–100.0)

## 2021-10-14 LAB — GLUCOSE, CAPILLARY: Glucose-Capillary: 132 mg/dL — ABNORMAL HIGH (ref 70–99)

## 2021-10-14 LAB — VITAMIN B12: Vitamin B-12: 955 pg/mL — ABNORMAL HIGH (ref 180–914)

## 2021-10-14 LAB — PROCALCITONIN: Procalcitonin: <0.1 ng/mL

## 2021-10-14 MED ORDER — NEPRO/CARBSTEADY PO LIQD
197.0000 mL | Freq: Three times a day (TID) | ORAL | Status: DC
  Administered 2021-10-14 – 2021-10-15 (×3): 237 mL

## 2021-10-14 MED ORDER — IPRATROPIUM-ALBUTEROL 0.5-2.5 (3) MG/3ML IN SOLN
3.0000 mL | Freq: Three times a day (TID) | RESPIRATORY_TRACT | Status: DC
  Administered 2021-10-14 (×2): 3 mL via RESPIRATORY_TRACT
  Filled 2021-10-14 (×2): qty 3

## 2021-10-14 MED ORDER — IPRATROPIUM-ALBUTEROL 0.5-2.5 (3) MG/3ML IN SOLN
3.0000 mL | RESPIRATORY_TRACT | Status: DC | PRN
Start: 2021-10-14 — End: 2021-10-21

## 2021-10-14 MED ORDER — FERROUS SULFATE 300 (60 FE) MG/5ML PO SYRP
300.0000 mg | ORAL_SOLUTION | Freq: Every day | ORAL | Status: DC
Start: 2021-10-14 — End: 2021-10-21
  Administered 2021-10-14 – 2021-10-20 (×7): 300 mg
  Filled 2021-10-14 (×7): qty 5

## 2021-10-14 MED ORDER — POTASSIUM CHLORIDE 10 MEQ/100ML IV SOLN
10.0000 meq | Freq: Once | INTRAVENOUS | Status: AC
  Administered 2021-10-14: 10 meq via INTRAVENOUS
  Filled 2021-10-14: qty 100

## 2021-10-14 MED ORDER — ATORVASTATIN CALCIUM 40 MG PO TABS
40.0000 mg | ORAL_TABLET | Freq: Every day | ORAL | Status: DC
Start: 2021-10-14 — End: 2021-10-21
  Administered 2021-10-14 – 2021-10-20 (×7): 40 mg
  Filled 2021-10-14 (×7): qty 1

## 2021-10-14 MED ORDER — FAMOTIDINE 40 MG/5ML PO SUSR
20.0000 mg | Freq: Every day | ORAL | Status: DC
Start: 2021-10-14 — End: 2021-10-21
  Administered 2021-10-14 – 2021-10-19 (×5): 20 mg
  Filled 2021-10-14 (×7): qty 2.5

## 2021-10-14 MED ORDER — SODIUM CHLORIDE 0.9 % IV BOLUS
500.0000 mL | Freq: Once | INTRAVENOUS | Status: AC
  Administered 2021-10-14: 500 mL via INTRAVENOUS

## 2021-10-14 MED ORDER — POLYETHYLENE GLYCOL 3350 17 G PO PACK
17.0000 g | PACK | Freq: Every day | ORAL | Status: DC
Start: 2021-10-14 — End: 2021-10-21
  Administered 2021-10-14 – 2021-10-20 (×7): 17 g
  Filled 2021-10-14 (×7): qty 1

## 2021-10-14 MED ORDER — TRAMADOL HCL 50 MG PO TABS
50.0000 mg | ORAL_TABLET | Freq: Every day | ORAL | Status: DC
  Administered 2021-10-14 – 2021-10-19 (×6): 50 mg
  Filled 2021-10-14 (×6): qty 1

## 2021-10-14 MED ORDER — ORAL CARE MOUTH RINSE
15.0000 mL | OROMUCOSAL | Status: DC | PRN
Start: 2021-10-14 — End: 2021-10-21

## 2021-10-14 MED ORDER — IPRATROPIUM-ALBUTEROL 0.5-2.5 (3) MG/3ML IN SOLN
3.0000 mL | Freq: Four times a day (QID) | RESPIRATORY_TRACT | Status: DC | PRN
  Administered 2021-10-14: 3 mL via RESPIRATORY_TRACT
  Filled 2021-10-14: qty 3

## 2021-10-14 MED ORDER — ORAL CARE MOUTH RINSE
15.0000 mL | OROMUCOSAL | Status: DC
  Administered 2021-10-15 – 2021-10-20 (×20): 15 mL via OROMUCOSAL

## 2021-10-14 MED ORDER — FREE WATER: 15.0000 mL | Status: DC

## 2021-10-14 MED ORDER — DOCUSATE SODIUM 50 MG/5ML PO LIQD
100.0000 mg | Freq: Every day | ORAL | Status: DC
Start: 2021-10-14 — End: 2021-10-21
  Administered 2021-10-15 – 2021-10-20 (×6): 100 mg
  Filled 2021-10-14 (×7): qty 10

## 2021-10-14 MED ORDER — LEVOTHYROXINE SODIUM 75 MCG PO TABS
75.0000 ug | ORAL_TABLET | Freq: Every day | ORAL | Status: DC
  Administered 2021-10-15 – 2021-10-20 (×5): 75 ug
  Filled 2021-10-14 (×5): qty 1

## 2021-10-14 MED ORDER — LACTATED RINGERS IV SOLN: INTRAVENOUS | Status: AC

## 2021-10-14 NOTE — H&P (Addendum)
History and Physical    Patient: Gregory [name] TTS:[id number] DOB: 05/13/1920 DOA: 10/14/2021 DOS: the patient was seen and examined on 10/14/2021 PCP: Orpah Greek, DO  Patient coming from: Home  Chief Complaint:  Chief Complaint  Patient presents with   Shortness of Breath   HPI: Gregory [name] is a 88 y.o. male with medical history significant of benign duodenal mass s/p PEG tube, chronic hypoxemia on 3 L, CAD, chronic CHF, atrial flutter s/p ablation with permanent a.fib not on anticoagulation due to recurrent GI bleed,bradycardia s/p pacemaker, s/p TAVR, hypertension, hyperlipidemia, hypothyroidism, CKD 3A, PAD who presents with not feeling well.   Pt not able to provide much history as he appears weak and chronically ill.He is bedbound. Son and daughter at bedside provides limited history although son is primary caretaker. Pt just joined Hospice 2 weeks ago.  Today he has felt subjective fever.  Reports PEG tube has not been taking in feed and fluids for at least 2 weeks. Lasix increased from '40mg'$  to '80mg'$  in the past few days due to more fluid build up but urine output remained the same. Abdominal has been distended.  Last bowel movement 2 days ago.  Sometimes has issues with constipation and takes a laxative daily.  Has felt nauseous sometimes but no vomiting.  Has chronic productive cough but nothing new.  No chest pain or abdominal pain.  In the ED, he was hypothermic to 95.8 F, no leukocytosis with lactate within normal limits.  Mild has mild hypokalemia 3.3.  AKI with creatinine of 2.49 up from 1.49.  BUN of 166. UA otherwise negative.  Negative flu and COVID PCR. Troponin flat at 87 and 99.  BNP of 685. TSH elevated at 6.109 with normal free T4.  EKG on my review with with left axis deviation and IVCD.  He was given IV fluid in ED.Hospitalist then called for admission.   Review of Systems: unable to review all systems due to the inability of the patient to answer  questions. Past Medical History:  Diagnosis Date   Atrial flutter [facility])    s/p ablation   Cancer [facility])    skin cancer   Cataracts, bilateral    Hypertension    Neuromuscular disorder ([place])    Pacemaker 2010   Thyroid disease    Past Surgical History:  Procedure Laterality Date   APPENDECTOMY     COLONOSCOPY     EYE SURGERY Bilateral    cataract surgery   HERNIA REPAIR Right    INGUINAL HERNIA REPAIR Left 10/12/2013   Procedure: LEFT  INGUINAL HERNIA REPAIR;  Surgeon: Odis Hollingshead, MD;  Location: [place];  Service: General;  Laterality: Left;   INSERTION OF MESH Left 10/12/2013   Procedure: INSERTION OF MESH;  Surgeon: Odis Hollingshead, MD;  Location: [place];  Service: General;  Laterality: Left;   PEG TUBE PLACEMENT Left    Social History:  reports that he has never smoked. He has never used smokeless tobacco. He reports that he does not drink alcohol and does not use drugs.  Allergies  Allergen Reactions   Cefepime Rash and Other (See Comments)    Severe myoclonus for cefepime- don't take again-    Amlodipine Besylate Other (See Comments)    edema   Penicillins Hives and Rash    Family History  Problem Relation Age of Onset   Heart disease Mother    Cancer Father        leukemia  Prior to Admission medications   Medication Sig Start Date End Date Taking? Authorizing Provider  atorvastatin (LIPITOR) 40 MG tablet Take 40 mg by mouth daily.    [provider]  clopidogrel (PLAVIX) 75 MG tablet Place 1 tablet (75 mg total) into feeding tube daily. 05/26/21   Arrien, Jimmy Picket, MD  docusate sodium (COLACE) 100 MG capsule Take 100 mg by mouth 2 (two) times daily.    [provider]  famotidine (PEPCID) 40 MG/5ML suspension Place 2.5 mLs (20 mg total) into feeding tube at bedtime. 05/26/21   Arrien, Jimmy Picket, MD  ferrous sulfate 300 (60 Fe) MG/5ML syrup Take by mouth. 07/07/21 07/07/22  [provider]  furosemide (LASIX) 20 MG  tablet Take 1 tablet Per J Tube daily as needed for edema or fluid (Take as needed for shortness of breath, leg swelling or weight gain 3 lbs in 24-48 hrs to 5 lbs in 7 days.). 05/26/21   Arrien, Jimmy Picket, MD  levothyroxine (SYNTHROID) 75 MCG tablet Place 1 tablet (75 mcg total) into feeding tube daily at 6 (six) AM. 04/06/21   Lajean Manes, MD  Nutritional Supplements (FEEDING SUPPLEMENT, NEPRO CARB STEADY,) LIQD Place 315 mLs into feeding tube 3 (three) times daily. Patient taking differently: Place 395 mLs into feeding tube 3 (three) times daily. 04/05/21   Lajean Manes, MD  polyethylene glycol (MIRALAX / GLYCOLAX) 17 g packet Take 17 g by mouth daily.    [provider]  Probiotic Product (PROBIOTIC PO) Give 1 capsule by tube daily.    [provider]  senna-docusate (SENOKOT-S) 8.6-50 MG tablet Place 2 tablets into feeding tube at bedtime. 04/14/21   Love, Ivan Anchors, PA-C  traZODone (DESYREL) 50 MG tablet SMARTSIG:1 Tablet(s) Gastro Tube Every Night 09/11/21   [provider]  Water For Irrigation, Sterile (FREE WATER) SOLN Place 300 mLs into feeding tube 4 (four) times daily. 04/05/21   Lajean Manes, MD    Physical Exam: Vitals:   10/14/21 1417 10/14/21 1422 10/14/21 1509 10/14/21 1600  BP:  (!) 133/58 (!) 140/98 111/64  Pulse: 71 76 71 78  Resp: '18 14  15  '$ Temp: (!) 97.3 F (36.3 C)  97.7 F (36.5 C)   TempSrc: Oral  Oral   SpO2: 92% 91% 93% 96%  Weight:      Height:       Constitutional: NAD, calm, chronically ill-appearing elderly male laying flat in bed Eyes: PERRL, lids and conjunctivae normal ENMT: Mucous membranes are dry with cracked lips Neck: normal, supple Respiratory: clear to auscultation bilaterally, no wheezing, no crackles.  Audible wheezes through the mouth on home 3 L.   Cardiovascular: Regular rate and rhythm, no murmurs / rubs / gallops. No extremity edema.Abdomen: no tenderness, no masses palpated. No hepatosplenomegaly. Bowel sounds  positive.  Musculoskeletal: no clubbing / cyanosis.Normal muscle tone.  Skin: Healing pressure ulcers on right lateral great toe, right lateral foot near the base of the fifth toe.  Left distal great toe. Neurologic: CN 2-12 grossly intact. Strength 5/5 in all 4.  Psychiatric: Normal judgment and insight. Alert and oriented x 3. Normal mood. Data Reviewed:  See HPI  Assessment and Plan: * Acute kidney injury superimposed on chronic kidney disease ([place]) AKI on CKD3a -creatinine of 2.49 up from 1.49. BUN 166 with slight worsening anemia. Questions possible bleed with hx of duodoenal mass. Will check FOBT. Although more likely from dehydration since PEG tube has not been functioning and had Lasix increased  the past few days -Given 500cc NS bolus. Keep on continuous IV fluids. Repeat creatinine later this afternoon to follow.  -Hold lasix and avoid other nephrotoxic agents.   Anemia Baseline around 8 with Hgb of 7.4 on presentation. Also now macrocytic.  -check Vitamin E32 and folic.  Also questions whether he is having some chronic GI bleed with his history of duodenal mass and elevated BUN today.Check FOBT.  -follow after getting fluids. Transfuse threshold Hgb <7.  Hypertension controlled  Protein calorie malnutrition ([place]) moderate  Dysphagia s/p G tube  -check abdominal X-ray due to concerns of PEG tube malfunctioning -dietician consulted for tube feed  Paroxysmal atrial fibrillation ([place]) Not on anticoagulation due to history of recurrent GI bleeding.  PVD (peripheral vascular disease) (HCC) Holding Plavix for now for anticipated thoracentesis  Hypothyroidism TSH is elevated to 6.109 with normal free T4.  Possibly sick euthyroid.  Recommend repeat outpatient.  Renal cyst possible mass on right upper pole seen on CT A/P. Recommend outpatient renal ultrasound or if AKI does not improve inpatient.   Chronic respiratory failure with hypoxia (HCC) Continue on 3L O2 at  baseline.  Pleural effusion Moderate pleural effusion on the right with small pleural effusion of the left -will obtain thoracentesis with fluid study. Will hold Lasix as he appears dehydrated with AKI. Unfortunately IR not able to perform until tomorrow. O2 requirement remains at baseline so will continue to monitor.   Hypernatremia Na of 146. Continue on IV LR and monitor.  Hypothermia Likely iatrogenic.  Patient felt hot at home and family gave him ice and put a fan on him prior to arrival to ED. -This has resolved following bair hugger -No signs of infection at this time with negative UA, chest x-ray shows mild pleural effusion versus infiltrate but no new symptoms. -Blood culture pending  Complete heart block s/p PPM  Secondary to history of bradycardia  Pressure ulcer Several pressure ulcers on bilateral feet.  He previously was followed by wound care but currently monitored by hospice nurses. -on Bactrim at home. Will discontinue as they are without signs of infection.       Advance Care Planning:   Code Status: Full Code remains Full code although on Hospice care   Consults: none  Family Communication: Discussed with son and daughter at bedside  Severity of Illness: The appropriate patient status for this patient is OBSERVATION. Observation status is judged to be reasonable and necessary in order to provide the required intensity of service to ensure the patient's safety. The patient's presenting symptoms, physical exam findings, and initial radiographic and laboratory data in the context of their medical condition is felt to place them at decreased risk for further clinical deterioration. Furthermore, it is anticipated that the patient will be medically stable for discharge from the hospital within 2 midnights of admission.   Author: Orene Desanctis, DO 10/14/2021 6:35 PM  For on call review [email or web address].

## 2021-10-14 NOTE — ED Provider Notes (Signed)
[facility] EMERGENCY DEPARTMENT Provider Note   CSN: [id number] Arrival date & time: 10/14/21  0203     History  Chief Complaint  Patient presents with   Fever    Demarrius Guerrero is a 88 y.o. male.  The history is provided by the patient and medical records.  Fever  88 y.o M with history of CKD stage III, CHF, chronic A-fib status post ablation, dyslipidemia, GERD, hypothyroidism, dysphagia with recommended g-tube placement, presenting to the ED with fever and generalized not feeling well.  Per family, just began today.  He denies any real cough, congestion, nausea, vomiting, or diarrhea.  Just overall does not feel well.  He is currently on hospice but remains full code.  On baseline 3L O2.  He is primarily bed bound but cared for at home.  Home Medications Prior to Admission medications   Medication Sig Start Date End Date Taking? Authorizing Provider  atorvastatin (LIPITOR) 40 MG tablet Take 40 mg by mouth daily.    [provider]  clopidogrel (PLAVIX) 75 MG tablet Place 1 tablet (75 mg total) into feeding tube daily. 05/26/21   Arrien, Jimmy Picket, MD  docusate sodium (COLACE) 100 MG capsule Take 100 mg by mouth 2 (two) times daily.    [provider]  famotidine (PEPCID) 40 MG/5ML suspension Place 2.5 mLs (20 mg total) into feeding tube at bedtime. 05/26/21   Arrien, Jimmy Picket, MD  ferrous sulfate 300 (60 Fe) MG/5ML syrup Take by mouth. 07/07/21 07/07/22  [provider]  furosemide (LASIX) 20 MG tablet Take 1 tablet Per J Tube daily as needed for edema or fluid (Take as needed for shortness of breath, leg swelling or weight gain 3 lbs in 24-48 hrs to 5 lbs in 7 days.). 05/26/21   Arrien, Jimmy Picket, MD  levothyroxine (SYNTHROID) 75 MCG tablet Place 1 tablet (75 mcg total) into feeding tube daily at 6 (six) AM. 04/06/21   Lajean Manes, MD  Nutritional Supplements (FEEDING SUPPLEMENT, NEPRO CARB STEADY,) LIQD Place 315 mLs into  feeding tube 3 (three) times daily. Patient taking differently: Place 395 mLs into feeding tube 3 (three) times daily. 04/05/21   Lajean Manes, MD  polyethylene glycol (MIRALAX / GLYCOLAX) 17 g packet Take 17 g by mouth daily.    [provider]  Probiotic Product (PROBIOTIC PO) Give 1 capsule by tube daily.    [provider]  senna-docusate (SENOKOT-S) 8.6-50 MG tablet Place 2 tablets into feeding tube at bedtime. 04/14/21   Love, Ivan Anchors, PA-C  traZODone (DESYREL) 50 MG tablet SMARTSIG:1 Tablet(s) Gastro Tube Every Night 09/11/21   [provider]  Water For Irrigation, Sterile (FREE WATER) SOLN Place 300 mLs into feeding tube 4 (four) times daily. 04/05/21   Lajean Manes, MD      Allergies    Cefepime, Amlodipine besylate, and Penicillins    Review of Systems   Review of Systems  Constitutional:  Positive for fever.  Neurological:  Positive for weakness (generalized).  All other systems reviewed and are negative.   Physical Exam Updated Vital Signs BP (!) 123/54   Pulse 69   Temp (!) 96.9 F (36.1 C) (Rectal)   Resp (!) 21   Ht '5\' 8"'$  (1.727 m)   Wt 60 kg   SpO2 96%   BMI 20.11 kg/m   Physical Exam Vitals and nursing note reviewed.  Constitutional:      Appearance: He is well-developed.     Comments: Elderly  HENT:     Head: Normocephalic and atraumatic.  Eyes:     Conjunctiva/sclera: Conjunctivae normal.     Pupils: Pupils are equal, round, and reactive to light.  Cardiovascular:     Rate and Rhythm: Normal rate and regular rhythm.     Heart sounds: Normal heart sounds.  Pulmonary:     Effort: Pulmonary effort is normal. No respiratory distress.     Breath sounds: Normal breath sounds. No rhonchi.  Abdominal:     General: Bowel sounds are normal.     Palpations: Abdomen is soft.     Tenderness: There is no abdominal tenderness. There is no rebound.  Musculoskeletal:        General: Normal range of motion.     Cervical back: Normal range  of motion.  Skin:    General: Skin is warm and dry.  Neurological:     Mental Status: He is alert and oriented to person, place, and time.     ED Results / Procedures / Treatments   Labs (all labs ordered are listed, but only abnormal results are displayed) Labs Reviewed  COMPREHENSIVE METABOLIC PANEL - Abnormal; Notable for the following components:      Result Value   Potassium 3.3 (*)    Glucose, Bld 140 (*)    BUN 166 (*)    Creatinine, Ser 2.49 (*)    GFR, Estimated 23 (*)    All other components within normal limits  URINALYSIS, ROUTINE W REFLEX MICROSCOPIC - Abnormal; Notable for the following components:   APPearance HAZY (*)    All other components within normal limits  BRAIN NATRIURETIC PEPTIDE - Abnormal; Notable for the following components:   B Natriuretic Peptide 685.0 (*)    All other components within normal limits  CBC WITH DIFFERENTIAL/PLATELET - Abnormal; Notable for the following components:   RBC 2.30 (*)    Hemoglobin 7.4 (*)    HCT 23.7 (*)    MCV 103.0 (*)    RDW 18.2 (*)    Platelets 102 (*)    Lymphs Abs 0.6 (*)    All other components within normal limits  TROPONIN I (HIGH SENSITIVITY) - Abnormal; Notable for the following components:   Troponin I (High Sensitivity) 87 (*)    All other components within normal limits  RESP PANEL BY RT-PCR (FLU A&B, COVID) ARPGX2  CULTURE, BLOOD (ROUTINE X 2)  CULTURE, BLOOD (ROUTINE X 2)  URINE CULTURE  LACTIC ACID, PLASMA  CBC WITH DIFFERENTIAL/PLATELET  LACTIC ACID, PLASMA  TSH  T4, FREE  TROPONIN I (HIGH SENSITIVITY)    EKG EKG Interpretation  Date/Time:  Saturday October 14 2021 02:10:16 EDT Ventricular Rate:  71 PR Interval:    QRS Duration: 173 QT Interval:  489 QTC Calculation: 532 R Axis:   -70 Text Interpretation: Complete AV block with wide QRS complex Nonspecific IVCD with LAD No significant change since last tracing Confirmed by Deno Etienne [phone]) on 10/14/2021 5:02:38  AM  Radiology DG Chest Port 1 View  Result Date: 10/14/2021 CLINICAL DATA:  Shortness of breath, fever, weakness. EXAM: PORTABLE CHEST 1 VIEW COMPARISON:  08/14/2021 FINDINGS: Heart is enlarged and the mediastinal contour is stable. A TAVR stent is noted. There is atherosclerotic calcification of the aorta. A multi lead pacemaker device is present over the left chest. Pulmonary vasculature is mildly distended. There are small bilateral pleural effusions with atelectasis or infiltrate at the left lung base. No pneumothorax. IMPRESSION: 1. Cardiomegaly with mildly distended pulmonary vasculature.  2. Mild atelectasis, edema, or infiltrate at the left lung base. 3. Small bilateral pleural effusions. Electronically Signed   By: Brett Fairy M.D.   On: 10/14/2021 02:39    Procedures Procedures    Medications Ordered in ED Medications  sodium chloride 0.9 % bolus 500 mL (500 mLs Intravenous New Bag/Given 10/14/21 0526)    ED Course/ Medical Decision Making/ A&P                           Medical Decision Making Amount and/or Complexity of Data Reviewed Labs: ordered. Radiology: ordered and independent interpretation performed. ECG/medicine tests: ordered and independent interpretation performed.  Risk Decision regarding hospitalization.   88 year old male presenting to the ED with generally not feeling well.  Family concerned for fever, but actually found to be hypothermic here with rectal temp of 95.77F.  He reports he feels SOB but denies cough or chest pain. Patient placed on bair hugger to increased temp, work-up initiated.-- labs, CXR, cultures, etc.  EKG without acute ischemia.  CXR with distended vasculature.  Labs as above-- normal lactate, no leukocytosis.  Chronic anemia, slightly below baseline.  Does have evidence of AKI-- SrCr today 2.49 (baseline around 1.5), BUN 166.  BNP 685.  Trop 87-- may be component of his AKI and some demand ischemia.  Has had higher values previously.    Discussion with family regarding goals of care-- they are adamant that he remains full code and they want any possible medical treatments done.  Will give small fluid bolus, plan admission for AKI.  Discussed with Dr. Alcario Drought-- morning team will admit for ongoing care.  Final Clinical Impression(s) / ED Diagnoses Final diagnoses:  AKI (acute kidney injury) [facility])    Rx / [place] Orders ED Discharge Orders     None         Larene Pickett, PA-C 10/14/21 [place], [place], DO 10/14/21 0559

## 2021-10-14 NOTE — Assessment & Plan Note (Signed)
Moderate pleural effusion on the right with small pleural effusion of the left -will obtain thoracentesis with fluid study. Will hold Lasix as he appears dehydrated with AKI. Unfortunately IR not able to perform until tomorrow. O2 requirement remains at baseline so will continue to monitor.

## 2021-10-14 NOTE — Assessment & Plan Note (Signed)
Na of 146. Continue on IV LR and monitor.

## 2021-10-14 NOTE — Assessment & Plan Note (Signed)
TSH is elevated to 6.109 with normal free T4.  Possibly sick euthyroid.  Recommend repeat outpatient.

## 2021-10-14 NOTE — Assessment & Plan Note (Addendum)
Holding Plavix for now for anticipated thoracentesis

## 2021-10-14 NOTE — Assessment & Plan Note (Signed)
controlled 

## 2021-10-14 NOTE — Assessment & Plan Note (Signed)
Continue on 3L O2 at baseline.

## 2021-10-14 NOTE — ED Notes (Signed)
Repositioned patient in bed, son remains at bedside. Bearhugger removed.

## 2021-10-14 NOTE — ED Notes (Signed)
C/o pain in heels , pillow put under both legs to lift heels off the bed.

## 2021-10-14 NOTE — Assessment & Plan Note (Addendum)
Several pressure ulcers on bilateral feet.  He previously was followed by wound care but currently monitored by hospice nurses. -on Bactrim at home. Will discontinue as they are without signs of infection.

## 2021-10-14 NOTE — ED Notes (Signed)
Hospice nurse Olivia Mackie at bedside at this time

## 2021-10-14 NOTE — ED Notes (Signed)
Admit provider at bedside 

## 2021-10-14 NOTE — ED Notes (Signed)
Resp at bedside. And pt transported to ct at this time after resp assessment

## 2021-10-14 NOTE — ED Notes (Signed)
Received verbal report from [place] at this time

## 2021-10-14 NOTE — ED Notes (Signed)
Admit provider aware of the audible wheezing

## 2021-10-14 NOTE — ED Notes (Signed)
Pt adjusted pt in bed, and turned to lt side at this time

## 2021-10-14 NOTE — Progress Notes (Signed)
Gregory [name]   Gregory [name] is a current [facility] hospice patient with a terminal diagnosis of Congestive Heart Failure. On the evening of 9.1.2023, pt had complaints of feeling hot and feeling "tired and bad all over". Hospice RN made on call visit, son reported patient had received 2 feedings through his G-tube, "but nothing else had seemed to fit". RN noted abdomen to be distended with hypoactive bowel sounds in all 4 quadrants. Last reported BM 8.30. Enema was recommended but son elected to wait until he had help the following day. Recommended continuing Miralax and Colace. Early morning of 9.2.2023, patient requested to go to [facility] as "he felt bad". Son called and notified hospice, hospice on-call informed son we could not request transport to a certain hospital; son stated he would discuss with his father. Hospice was notified 9.2.2023 of hospital admission with a diagnosis of Acute Kidney Injury and anemia. Per Dr. Hollace Kinnier with [facility], this is a related hospital admission.  Visited patient and son, Gregory [name] at bedside. Patient is very soft spoken with few words. Mr. Prieur is trembling and acknowledges he is cold. A tabletop fan is blowing on his face and bedside RN brings him a warm blanket. Pt also reports he is having abdominal pain, son confirms last BM 8.30. Gregory [name] and I had brief discussion about the challenges of managing CHF with diuresis and dehydration. Confirmed Gregory [name]'s wishes for aggressive treatment of all conditions that can be treated and for patient to remain FULL CODE.   Patients abdomen soft, distended, with G-Tube in place. No redness or drainage noted. Audible wheezing noted. Bedside RN notified of wheezing and patients complaint of abdominal pain.   Patient GIP appropriate for gentle fluid hydration and evaluation of G-Tube placement.  V/S: 98.0, 114/61, 70, 20, 97% via O2 at [place] @ 2 lpm  I/O: 98.3/275  Abnormal Labs: Na 146, K 3.3,  Glu 106, BUN 162, Creatinine 2.52, GFR 22, RBC 2.30, Hgb 7.4, Hct 23.7, MCV 103, RDW 18.2, Plt 102, TSH 6.109, Vit B 12 955, Troponin 89  Diagnostics: Abdomen 1 View IMPRESSION: 1. Collection of contrast inferior to the gastric antrum may be related to distention of the antral region of the stomach, but extraluminal contrast pool is not excluded. CT abdomen recommended to further evaluate.  CT Abdomen IMPRESSION: 1. Well-positioned percutaneous gastrostomy tube. No extraluminal contrast. 2. Abnormal 2.3 cm soft tissue projecting along the inferior wall of the third portion of the duodenum. This could reflect a mass arising from the duodenum such as a leiomyoma. It could potentially reflect an enlarged retroperitoneal lymph node indenting the duodenum. This could be further assessed with endoscopy. 3. Possible mass from the upper pole the right kidney. A possible cystic lesion was noted in the upper right kidney on an ultrasound dated 03/02/2021. Consider follow-up renal ultrasound for further assessment. 4. Moderate right and small left pleural effusions with associated dependent atelectasis. 5. Aortic atherosclerosis.  IV/PRNs: LR 50 cc/hr, NaCl bolus 500 cc, Kcl 10 mEq IV X 1, Duoneb 0.5-2.5 X 1  Problem List: Acute kidney injury superimposed on chronic kidney disease (HCC) AKI on CKD3a -creatinine of 2.49 up from 1.49. BUN 166 with slight worsening anemia. Questions possible bleed with hx of duodoenal mass. Will check FOBT. Although more likely from dehydration since PEG tube has not been functioning and had Lasix increased the past few days -Given 500cc NS bolus. Keep on continuous IV fluids. Repeat creatinine later this  afternoon to follow.  -Hold lasix and avoid other nephrotoxic agents.    Anemia Baseline around 8 with Hgb of 7.4 on presentation. Also now macrocytic.  -check Vitamin W23 and folic.  Also questions whether he is having some chronic GI bleed with his history  of duodenal mass and elevated BUN today.Check FOBT.  -follow after getting fluids. Transfuse threshold Hgb <7.   Hypertension controlled   Protein calorie malnutrition ([place]) moderate   Dysphagia s/p G tube  -check abdominal X-ray due to concerns of PEG tube malfunctioning -dietician consulted for tube feed   Paroxysmal atrial fibrillation ([place]) Not on anticoagulation due to history of recurrent GI bleeding.   PVD (peripheral vascular disease) (HCC) Holding Plavix for now for anticipated thoracentesis   Hypothyroidism TSH is elevated to 6.109 with normal free T4.  Possibly sick euthyroid.  Recommend repeat outpatient.   Renal cyst possible mass on right upper pole seen on CT A/P. Recommend outpatient renal ultrasound or if AKI does not improve inpatient.    Chronic respiratory failure with hypoxia (HCC) Continue on 3L O2 at baseline.   Pleural effusion Moderate pleural effusion on the right with small pleural effusion of the left -will obtain thoracentesis with fluid study. Will hold Lasix as he appears dehydrated with AKI. Unfortunately IR not able to perform until tomorrow. O2 requirement remains at baseline so will continue to monitor.    Hypernatremia Na of 146. Continue on IV LR and monitor.   Hypothermia Likely iatrogenic.  Patient felt hot at home and family gave him ice and put a fan on him prior to arrival to ED. -This has resolved following bair hugger -No signs of infection at this time with negative UA, chest x-ray shows mild pleural effusion versus infiltrate but no new symptoms. -Blood culture pending   Complete heart block s/p PPM  Secondary to history of bradycardia   Pressure ulcer Several pressure ulcers on bilateral feet.  He previously was followed by wound care but currently monitored by hospice nurses. -on Bactrim at home. Will discontinue as they are without signs of infection.   Discharge Planning: TBD  Family Contact: Son, Gregory [name] at  bedside  IDT: updated  Goals of care: Unclear. Full Code, need further discussion regarding further workup of findings of CT Scan.  Should patient need ambulance transport upon discharge, please call [facility] as [facility] contracts with [facility] for all active hospice patients.  Please call with any hospice related questions or concerns.  Thank you, Gregory [name], BSN, RN [facility] Liaison [phone]

## 2021-10-14 NOTE — Assessment & Plan Note (Signed)
Secondary to history of bradycardia

## 2021-10-14 NOTE — ED Notes (Signed)
Pt is asking about a bed upstairs. Advised pt unsure when that will be however would work on getting pt a hospital bed. Secretary called and asked to order a hospital bed for pt at this time

## 2021-10-14 NOTE — Assessment & Plan Note (Signed)
possible mass on right upper pole seen on CT A/P. Recommend outpatient renal ultrasound or if AKI does not improve inpatient.

## 2021-10-14 NOTE — Assessment & Plan Note (Signed)
Not on anticoagulation due to history of recurrent GI bleeding.

## 2021-10-14 NOTE — ED Triage Notes (Signed)
Pt arrived via [facility] for cc of fever, generalized not feeling well. Per EMS, pt is on hospice, 3L O2 baseline, makes own medical decisions, and is a full code. Pt denies shortness of breath, nausea, vomiting, diarrhea, but reports feeling generally unwell, warm to the touch.   EMS Vitals BP 150/40 HR 86 SPO2 96% RR 18

## 2021-10-14 NOTE — Assessment & Plan Note (Signed)
AKI on CKD3a -creatinine of 2.49 up from 1.49. BUN 166 with slight worsening anemia. Questions possible bleed with hx of duodoenal mass. Will check FOBT. Although more likely from dehydration since PEG tube has not been functioning and had Lasix increased the past few days -Given 500cc NS bolus. Keep on continuous IV fluids. Repeat creatinine later this afternoon to follow.  -Hold lasix and avoid other nephrotoxic agents.

## 2021-10-14 NOTE — ED Notes (Signed)
Upon assessment patient was on bearhugger, sleeping at present. Family at bedside.

## 2021-10-14 NOTE — Assessment & Plan Note (Signed)
moderate

## 2021-10-14 NOTE — Assessment & Plan Note (Addendum)
Baseline around 8 with Hgb of 7.4 on presentation. Also now macrocytic.  -check Vitamin B33 and folic.  Also questions whether he is having some chronic GI bleed with his history of duodenal mass and elevated BUN today.Check FOBT.  -follow after getting fluids. Transfuse threshold Hgb <7.

## 2021-10-14 NOTE — Assessment & Plan Note (Signed)
Likely iatrogenic.  Patient felt hot at home and family gave him ice and put a fan on him prior to arrival to ED. -This has resolved following bair hugger -No signs of infection at this time with negative UA, chest x-ray shows mild pleural effusion versus infiltrate but no new symptoms. -Blood culture pending

## 2021-10-14 NOTE — Assessment & Plan Note (Addendum)
-  check abdominal X-ray due to concerns of PEG tube malfunctioning -dietician consulted for tube feed

## 2021-10-15 ENCOUNTER — Observation Stay (HOSPITAL_COMMUNITY)

## 2021-10-15 ENCOUNTER — Other Ambulatory Visit (HOSPITAL_COMMUNITY): Payer: Medicare Other

## 2021-10-15 DIAGNOSIS — R1312 Dysphagia, oropharyngeal phase: Secondary | ICD-10-CM | POA: Diagnosis not present

## 2021-10-15 DIAGNOSIS — Z952 Presence of prosthetic heart valve: Secondary | ICD-10-CM | POA: Diagnosis not present

## 2021-10-15 DIAGNOSIS — J9611 Chronic respiratory failure with hypoxia: Secondary | ICD-10-CM | POA: Diagnosis present

## 2021-10-15 DIAGNOSIS — D631 Anemia in chronic kidney disease: Secondary | ICD-10-CM | POA: Diagnosis present

## 2021-10-15 DIAGNOSIS — E871 Hypo-osmolality and hyponatremia: Secondary | ICD-10-CM | POA: Diagnosis present

## 2021-10-15 DIAGNOSIS — E43 Unspecified severe protein-calorie malnutrition: Secondary | ICD-10-CM | POA: Diagnosis present

## 2021-10-15 DIAGNOSIS — R627 Adult failure to thrive: Secondary | ICD-10-CM | POA: Diagnosis present

## 2021-10-15 DIAGNOSIS — Z20822 Contact with and (suspected) exposure to covid-19: Secondary | ICD-10-CM | POA: Diagnosis present

## 2021-10-15 DIAGNOSIS — D649 Anemia, unspecified: Secondary | ICD-10-CM | POA: Diagnosis not present

## 2021-10-15 DIAGNOSIS — E876 Hypokalemia: Secondary | ICD-10-CM | POA: Diagnosis present

## 2021-10-15 DIAGNOSIS — Z931 Gastrostomy status: Secondary | ICD-10-CM | POA: Diagnosis not present

## 2021-10-15 DIAGNOSIS — J9811 Atelectasis: Secondary | ICD-10-CM | POA: Diagnosis present

## 2021-10-15 DIAGNOSIS — N1831 Chronic kidney disease, stage 3a: Secondary | ICD-10-CM | POA: Diagnosis present

## 2021-10-15 DIAGNOSIS — I5032 Chronic diastolic (congestive) heart failure: Secondary | ICD-10-CM | POA: Diagnosis present

## 2021-10-15 DIAGNOSIS — R509 Fever, unspecified: Secondary | ICD-10-CM | POA: Diagnosis present

## 2021-10-15 DIAGNOSIS — E039 Hypothyroidism, unspecified: Secondary | ICD-10-CM | POA: Diagnosis present

## 2021-10-15 DIAGNOSIS — G9341 Metabolic encephalopathy: Secondary | ICD-10-CM | POA: Diagnosis present

## 2021-10-15 DIAGNOSIS — E860 Dehydration: Secondary | ICD-10-CM | POA: Diagnosis present

## 2021-10-15 DIAGNOSIS — E785 Hyperlipidemia, unspecified: Secondary | ICD-10-CM | POA: Diagnosis present

## 2021-10-15 DIAGNOSIS — I739 Peripheral vascular disease, unspecified: Secondary | ICD-10-CM | POA: Diagnosis present

## 2021-10-15 DIAGNOSIS — I442 Atrioventricular block, complete: Secondary | ICD-10-CM | POA: Diagnosis present

## 2021-10-15 DIAGNOSIS — Z66 Do not resuscitate: Secondary | ICD-10-CM | POA: Diagnosis present

## 2021-10-15 DIAGNOSIS — I130 Hypertensive heart and chronic kidney disease with heart failure and stage 1 through stage 4 chronic kidney disease, or unspecified chronic kidney disease: Secondary | ICD-10-CM | POA: Diagnosis present

## 2021-10-15 DIAGNOSIS — I4821 Permanent atrial fibrillation: Secondary | ICD-10-CM | POA: Diagnosis present

## 2021-10-15 DIAGNOSIS — Z781 Physical restraint status: Secondary | ICD-10-CM | POA: Diagnosis not present

## 2021-10-15 DIAGNOSIS — N179 Acute kidney failure, unspecified: Secondary | ICD-10-CM | POA: Diagnosis present

## 2021-10-15 DIAGNOSIS — E870 Hyperosmolality and hypernatremia: Secondary | ICD-10-CM | POA: Diagnosis present

## 2021-10-15 LAB — CBC
HCT: 24.7 % — ABNORMAL LOW (ref 39.0–52.0)
Hemoglobin: 7.6 g/dL — ABNORMAL LOW (ref 13.0–17.0)
MCH: 31.7 pg (ref 26.0–34.0)
MCHC: 30.8 g/dL (ref 30.0–36.0)
MCV: 102.9 fL — ABNORMAL HIGH (ref 80.0–100.0)
Platelets: 119 10*3/uL — ABNORMAL LOW (ref 150–400)
RBC: 2.4 MIL/uL — ABNORMAL LOW (ref 4.22–5.81)
RDW: 18.4 % — ABNORMAL HIGH (ref 11.5–15.5)
WBC: 8.5 10*3/uL (ref 4.0–10.5)
nRBC: 0 % (ref 0.0–0.2)

## 2021-10-15 LAB — BASIC METABOLIC PANEL
Anion gap: 14 (ref 5–15)
BUN: 158 mg/dL — ABNORMAL HIGH (ref 8–23)
CO2: 25 mmol/L (ref 22–32)
Calcium: 9.7 mg/dL (ref 8.9–10.3)
Chloride: 107 mmol/L (ref 98–111)
Creatinine, Ser: 2.43 mg/dL — ABNORMAL HIGH (ref 0.61–1.24)
GFR, Estimated: 23 mL/min — ABNORMAL LOW (ref >60)
Glucose, Bld: 114 mg/dL — ABNORMAL HIGH (ref 70–99)
Potassium: 3.3 mmol/L — ABNORMAL LOW (ref 3.5–5.1)
Sodium: 146 mmol/L — ABNORMAL HIGH (ref 135–145)

## 2021-10-15 LAB — URINE CULTURE

## 2021-10-15 LAB — GLUCOSE, CAPILLARY: Glucose-Capillary: 115 mg/dL — ABNORMAL HIGH (ref 70–99)

## 2021-10-15 LAB — LACTATE DEHYDROGENASE: LDH: 243 U/L — ABNORMAL HIGH (ref 98–192)

## 2021-10-15 LAB — AMMONIA: Ammonia: 47 umol/L — ABNORMAL HIGH (ref 9–35)

## 2021-10-15 MED ORDER — CLOPIDOGREL BISULFATE 75 MG PO TABS
75.0000 mg | ORAL_TABLET | Freq: Every day | ORAL | Status: DC
  Administered 2021-10-15 – 2021-10-20 (×6): 75 mg
  Filled 2021-10-15 (×6): qty 1

## 2021-10-15 MED ORDER — SODIUM CHLORIDE 0.9 % IV SOLN: INTRAVENOUS | Status: DC

## 2021-10-15 MED ORDER — HALOPERIDOL 5 MG PO TABS
5.0000 mg | ORAL_TABLET | Freq: Three times a day (TID) | ORAL | Status: DC | PRN
  Filled 2021-10-15: qty 1

## 2021-10-15 MED ORDER — FREE WATER
60.0000 mL | Freq: Three times a day (TID) | Status: DC
Start: 2021-10-15 — End: 2021-10-16
  Administered 2021-10-15: 60 mL

## 2021-10-15 MED ORDER — IPRATROPIUM-ALBUTEROL 0.5-2.5 (3) MG/3ML IN SOLN
3.0000 mL | Freq: Two times a day (BID) | RESPIRATORY_TRACT | Status: DC
Start: 2021-10-15 — End: 2021-10-19
  Administered 2021-10-15 – 2021-10-19 (×9): 3 mL via RESPIRATORY_TRACT
  Filled 2021-10-15 (×9): qty 3

## 2021-10-15 MED ORDER — NEPRO/CARBSTEADY PO LIQD
315.0000 mL | Freq: Three times a day (TID) | ORAL | Status: DC
  Administered 2021-10-15 – 2021-10-20 (×15): 315 mL
  Filled 2021-10-15 (×3): qty 474

## 2021-10-15 MED ORDER — FREE WATER
100.0000 mL | Freq: Three times a day (TID) | Status: DC
Start: 2021-10-15 — End: 2021-10-15

## 2021-10-15 MED ORDER — HALOPERIDOL LACTATE 5 MG/ML IJ SOLN
5.0000 mg | Freq: Three times a day (TID) | INTRAMUSCULAR | Status: DC | PRN
  Administered 2021-10-16 – 2021-10-20 (×4): 5 mg via INTRAMUSCULAR
  Filled 2021-10-15 (×4): qty 1

## 2021-10-15 MED ORDER — LIDOCAINE HCL (PF) 1 % IJ SOLN
INTRAMUSCULAR | Status: AC
  Filled 2021-10-15: qty 30

## 2021-10-15 MED ORDER — POTASSIUM CHLORIDE 20 MEQ PO PACK
40.0000 meq | PACK | Freq: Once | ORAL | Status: AC
  Administered 2021-10-15: 40 meq
  Filled 2021-10-15: qty 2

## 2021-10-15 NOTE — Progress Notes (Signed)
Overnight progress note  Patient mildly confused/disoriented.  During daytime he was oriented to person, place, and time but at night AAO x2 (does not remember the year).  Not hypoglycemic.  No change in vital signs.  Per RN, patient does not have any focal neurologic deficits concerning for stroke.  RN informed me that patient's daughter was here earlier tonight who felt that this was not a significant change from his baseline as he does get confused at night at home.  However, later after the patient's daughter left his caregiver came to bedside and was concerned that patient appears more confused than usual.    I think mild confusion is likely due to metabolic reasons such as AKI/uremia in the setting of patient's age/ ?mild dementia.  However, it seems he has not had brain imaging done during this hospitalization.  Will obtain stat CT head.  Labs done yesterday not showing B12 deficiency, TSH elevated with normal free T4.  Will also check ammonia level and UA.

## 2021-10-15 NOTE — Progress Notes (Signed)
Initial Nutrition Assessment  DOCUMENTATION CODES:   Not applicable  INTERVENTION:   -Continue bolus feedings via PEG:   315 ml Nepro TID   30 ml free water flush before and after each feeding administration    Tube feeding regimen provides 1701 kcal (100% of needs), 77 grams of protein, and 687 ml of H2O. Total free water: 687 ml daily  NUTRITION DIAGNOSIS:   Inadequate oral intake related to inability to eat as evidenced by NPO status.  GOAL:   Patient will meet greater than or equal to 90% of their needs  MONITOR:   TF tolerance  REASON FOR ASSESSMENT:   Consult Enteral/tube feeding initiation and management  ASSESSMENT:   Pt with history of dysphagia-s/p PEG tube placement, chronic hypoxic respiratory failure on 2-3 L of oxygen at all times, benign duodenal mass, chronic HFpEF, CKD stage IIIa, severe aortic stenosis s/p TAVR September 2020, chronic atrial fibrillation (not on anticoagulation due to bleeding risk), CAD being managed medically, PADwho is followed at home by hospice (remains a full code) brought to the hospital for generalized weakness.  Pt admitted with AKI and CKD stage III and rt sided pleural effusion.   Reviewed I/O's: -827 ml x 24 hours  UOP: 925 ml x 24 hours   Pt unavailable at time of visit. Attempted to speak with pt via call to hospital room phone, however, unable to reach. RD unable to obtain further nutrition-related history or complete nutrition-focused physical exam at this time.    Pt NPO, chronically PEG dependent secondary to dysphagia. Home TF regimen is [employer] Renal- 5 cartons per day at home (given as 326m TID via tube); this provides 2375kcal/day, 110g/day protein and 8463mday of free water.   Reviewed wt hx; pt has experienced a 24.7% wt loss over the past 6 months, which is significant for time frame.   Highly suspect pt with malnutrition, however, unable to identify at this time. Pt is currently on hospice and suspect  continued nutritional decline.   Medications reviewed and include colace, ferrous sulfate, miralax, and 0.9% sodium chloride infusion @ 75 ml/hr.   Lab Results  Component Value Date   HGBA1C 5.8 (H) 08/15/2021   PTA DM medications are none.   Labs reviewed: Na: 146, K: 3.3, CBGS:132 (inpatient orders for glycemic control are none).    Diet Order:   Diet Order             Diet NPO time specified  Diet effective now                   EDUCATION NEEDS:   No education needs have been identified at this time  Skin:  Skin Assessment: Skin Integrity Issues: Skin Integrity Issues:: Unstageable, DTI, Other (Comment) DTI: rt heel Unstageable: rt and lt anterior distal toes Other: MASD rt and lt groin  Last BM:  10/15/21 (type 7)  Height:   Ht Readings from Last 1 Encounters:  10/14/21 '5\' 8"'$  (1.727 m)    Weight:   Wt Readings from Last 1 Encounters:  10/14/21 60 kg    Ideal Body Weight:  70 kg  BMI:  Body mass index is 20.11 kg/m.  Estimated Nutritional Needs:   Kcal:  1600-1800  Protein:  80-95 grams  Fluid:  > 1.6 L    JeLoistine ChanceRD, LDN, CDEkronegistered Dietitian II Certified Diabetes Care and Education Specialist Please refer to AMArk[facility] Methodist Medical Centeror RD and/or RD on-call/weekend/after hours pager

## 2021-10-15 NOTE — Progress Notes (Signed)
[facility]) Hospital Liaison Note    Gregory [name] is a current [facility] hospice patient with a terminal diagnosis of Congestive Heart Failure. On the evening of 9.1.2023, pt had complaints of feeling hot and feeling "tired and bad all over". Hospice RN made on call visit, son reported patient had received 2 feedings through his G-tube, "but nothing else had seemed to fit". RN noted abdomen to be distended with hypoactive bowel sounds in all 4 quadrants. Last reported BM 8.30. Enema was recommended but son elected to wait until he had help the following day. Recommended continuing Miralax and Colace. Early morning of 9.2.2023, patient requested to go to [facility] as "he felt bad". Son called and notified hospice, hospice on-call informed son we could not request transport to a certain hospital; son stated he would discuss with his father. Hospice was notified 9.2.2023 of hospital admission with a diagnosis of Acute Kidney Injury and anemia. Per Dr. Hollace Kinnier with [facility], this is a related hospital admission.   Visited patient at bedside with Dr. Sloan Leiter, daughter Gregory [name] and son, Gregory [name]. Transport arrived to take patient for thoracentesis but this was canceled after review of xrays and scans by Dr. Sloan Leiter with family. Patient is laying mostly flat without shortness of breath, denies any pain today. Family reports he seems to greatly benefit from nebulizer. We discussed continuing this upon discharge. Patient with confusion last evening, CT scan ordered.  Patient remains GIP appropriate for continued gentle hydration, careful assessment of volume status and monitoring of lab work for AKI.  VS: 97.6, 115/65, 74, 14, 98% on 3 lpm [place]  I/O:98.3/925  Abnormal Labs: Na 146, K 3.3, Glucose 114, BUN 158, Creatinine 2.43, GFR 23, LDH 243, Ammonia 47, RBC 2.40, Hgb 7.6, Hct 24.7, MCV 102.9, RDW 18.4, Platelets 119  Diagnostics: CT Head IMPRESSION: 1. No acute intracranial abnormalities. 2.  Chronic microvascular disease and brain atrophy. 3. Remote right cerebellar hemisphere infarct.  IV/PRN's: 0.9% NaCl at 75 cc/hr  Assessment/Plan: AKI on CKD stage IIIa: Suspect hemodynamically mediated due to use of furosemide/Bactrim.  Patient is n.p.o.-and is dependent on PEG tube feeds for hydration.  Volume status relatively stable but suspect there is still some room for some gentle hydration-cautiously resume IV fluid hydration and watch for any signs of volume overload.  Avoid nephrotoxic agents and follow electrolytes/intake/output closely.  Long discussion with son/daughter at bedside-both aware that patient will be a poor candidate for long-term HD given his overall clinical state.  Thankfully no acute indications for RRT at this point.   Right sided pleural effusion: This is a chronic issue-likely a transudate in the setting of known HFpEF.  He has no symptoms from this effusion.  Do not think a thoracocentesis will provide any clinical benefit.  Risks currently outweigh any benefits.  Discussed with son/daughter at bedside-thoracocentesis canceled.  If this were to become more symptomatic-we can always reconsider.   Acute metabolic encephalopathy: Likely due to AKI-BUN significantly elevated.  However per family-patient routinely gets confused at night at home as well.  He apparently is on Haldol as needed-this will be resumed.  Family aware that delirium will be slightly worse in the hospital.  Continue to maintain delirium precautions.   Known duodenal mass: Per review of prior records in epic-this has been biopsied in [facility] is benign.  Review of CT scan done on 9/2-does not show any gastric distention (no history of vomiting as well)-and contrast seems to have passed to his  small intestine.  No signs of gastric outlet obstruction.  Son reports that feeding via gravity is slow-I suspect that may be from age related gastroparesis.  Son/daughter aware that no plans for any  endoscopic evaluation/surgical evaluation-since this is asymptomatic at this point-he would not be a candidate for surgical resection in any event.   Chronic HFpEF: Currently euvolemic-we will need to watch closely for any signs of volume overload in the setting of IV fluid hydration.   Permanent atrial fibrillation-continue telemetry monitoring.  Per review of outpatient cardiology note-not a candidate for anticoagulation due to bleeding risk.  Resume Plavix.   History of complete heart block-s/p PPM placement: Continue telemetry monitoring   Dysphagia: Has been n.p.o. since January 2023-dependent on PEG tube feedings.   Chronic hypoxic respiratory failure on 2-3 L of oxygen at baseline: Stable on 2-3 L of oxygen this morning.  Lying flat.  Not in any respiratory distress.   PAD: Continue Plavix   Hypokalemia: Mild-replete and recheck.   Hyponatremia: Mild-due to dehydration-being gently hydrated-start free water via PEG tube.   Hypothyroidism: Continue Synthroid-TSH minimally elevated-stable for PCP to repeat TSH in 4 to 6 weeks.   Macrocytic anemia: Chronic issue-likely secondary to CKD-suspect anemia slightly worse due to IVF dilution.  B12/folate level stable.   Chronic bilateral lower extremity pressure ulcers: Present prior to [facility] care consult.  No indication of infection at this point.   Renal cyst/mass upper pole of right kidney: Stable for outpatient follow-up.  Incidental finding.   Severe protein calorie malnutrition: On PEG tube feeds.   Severe failure to thrive syndrome: Bed bound-did been on family for all activities of daily living.   Palliative care: Followed by hospice-however remains a full code.  Long discussion with son/daughter-with hospice RN from [facility] in the room-they realized that he is not a candidate for aggressive care-but at this time want to continue full CODE STATUS.  Discharge Planning: Return Home with Hospice  Family Contact:  Sister, Gregory [name] and son, Gregory [name]  IDT: updated  Goals of Care: Remains Full Code, gentle hydration and monitoring of labs to assess for AKI improvement. Should patient need ambulance transport upon discharge, please call [facility] as [facility] for all active hospice patients.   Please call with any hospice related questions or concerns.   Thank you, Margaretmary Eddy, BSN, RN [facility] Liaison [phone]

## 2021-10-15 NOTE — Progress Notes (Signed)
Pt back from CT

## 2021-10-15 NOTE — Progress Notes (Addendum)
PROGRESS NOTE        PATIENT DETAILS Name: Gregory [name] Age: 88 y.o. Sex: male Date of Birth: [birth year] Admit Date: 10/14/2021 Admitting Physician Evalee Mutton Kristeen Mans, MD OXB:DZHG, [name], DO  Brief Summary: Patient is a 88 y.o.  male with history of dysphagia-s/p PEG tube placement, chronic hypoxic respiratory failure on 2-3 L of oxygen at all times, benign duodenal mass, chronic HFpEF, CKD stage IIIa, severe aortic stenosis s/p TAVR September 2020, chronic atrial fibrillation (not on anticoagulation due to bleeding risk), CAD being managed medically, PAD-who is followed at home by hospice (remains a full code)-brought to the hospital for generalized weakness-family thought his breathing was somewhat labored-upon further evaluation-he was found to have AKI and subsequently admitted to the hospitalist service.  See below for further details.  Significant events: 9/2>> fatigue/generalized weakness-found to have AKI-admit to [facility].  Significant studies: 9/2>> CXR: Atelectasis at left lung base, small bilateral pleural effusions. 9/2>> CT abdomen/pelvis: Well positioned gastrostomy tube-2.3 cm soft tissue projecting into the inferior wall of the third portion of the duodenum.  Possible mass in the upper pole of right kidney.  Moderate right/small left pleural effusion. 9/3>> CT head: No acute intracranial abnormalities.  Significant microbiology data: 9/2>> blood cultures: No growth 9/2>> COVID/influenza PCR: Negative  Procedures: None  Consults: None  Subjective: Frail-has intentional tremor-but denies any chest pain/shortness of breath.  Lying comfortably in bed.  On just 2 L of oxygen.  Son/daughter at bedside.  Had some delirium last night.  Per family-patient gets confused at home as well.  Objective: Vitals: Blood pressure 115/65, pulse 74, temperature 97.6 F (36.4 C), temperature source Axillary, resp. rate 14, height '5\' 8"'$  (1.727 m),  weight 60 kg, SpO2 98 %.   Exam: Gen Exam: Frail/chronically sick appearing-not in any distress. HEENT:atraumatic, normocephalic Chest: B/L clear to auscultation anteriorly CVS:S1S2 regular Abdomen:soft non tender, non distended.  PEG tube in place. Extremities:no edema Neurology: Generalized weakness but nonfocal.  Has intention tremors. Skin: no rash  Pertinent Labs/Radiology:    Latest Ref Rng & Units 10/15/2021    3:49 AM 10/14/2021    3:15 AM 08/16/2021    6:00 AM  CBC  WBC 4.0 - 10.5 K/uL 8.5  7.7  9.2   Hemoglobin 13.0 - 17.0 g/dL 7.6  7.4  8.4   Hematocrit 39.0 - 52.0 % 24.7  23.7  26.6   Platelets 150 - 400 K/uL 119  102  126     Lab Results  Component Value Date   NA 146 (H) 10/15/2021   K 3.3 (L) 10/15/2021   CL 107 10/15/2021   CO2 25 10/15/2021     Assessment/Plan: AKI on CKD stage IIIa: Suspect hemodynamically mediated due to use of furosemide/Bactrim.  Patient is n.p.o.-and is dependent on PEG tube feeds for hydration.  Volume status relatively stable but suspect there is still some room for some gentle hydration-cautiously resume IV fluid hydration and watch for any signs of volume overload.  Avoid nephrotoxic agents and follow electrolytes/intake/output closely.  Long discussion with son/daughter at bedside-both aware that patient will be a poor candidate for long-term HD given his overall clinical state.  Thankfully no acute indications for RRT at this point.  Right sided pleural effusion: This is a chronic issue-likely a transudate in the setting of known HFpEF.  He has no symptoms from  this effusion.  Do not think a thoracocentesis will provide any clinical benefit.  Risks currently outweigh any benefits.  Discussed with son/daughter at bedside-thoracocentesis canceled.  If this were to become more symptomatic-we can always reconsider.  Acute metabolic encephalopathy: Likely due to AKI-BUN significantly elevated.  However per family-patient routinely gets confused  at night at home as well.  He apparently is on Haldol as needed-this will be resumed.  Family aware that delirium will be slightly worse in the hospital.  Continue to maintain delirium precautions.  Known duodenal mass: Per review of prior records in epic-this has been biopsied in [facility] is benign.  Review of CT scan done on 9/2-does not show any gastric distention (no history of vomiting as well)-and contrast seems to have passed to his small intestine.  No signs of gastric outlet obstruction.  Son reports that feeding via gravity is slow-I suspect that may be from age related gastroparesis.  Son/daughter aware that no plans for any endoscopic evaluation/surgical evaluation-since this is asymptomatic at this point-he would not be a candidate for surgical resection in any event.  Chronic HFpEF: Currently euvolemic-we will need to watch closely for any signs of volume overload in the setting of IV fluid hydration.  Permanent atrial fibrillation-continue telemetry monitoring.  Per review of outpatient cardiology note-not a candidate for anticoagulation due to bleeding risk.  Resume Plavix.  History of complete heart block-s/p PPM placement: Continue telemetry monitoring  Dysphagia: Has been n.p.o. since January 2023-dependent on PEG tube feedings.  Chronic hypoxic respiratory failure on 2-3 L of oxygen at baseline: Stable on 2-3 L of oxygen this morning.  Lying flat.  Not in any respiratory distress.  PAD: Continue Plavix  Hypokalemia: Mild-replete and recheck.  Hyponatremia: Mild-due to dehydration-being gently hydrated-start free water via PEG tube.  Hypothyroidism: Continue Synthroid-TSH minimally elevated-stable for PCP to repeat TSH in 4 to 6 weeks.  Macrocytic anemia: Chronic issue-likely secondary to CKD-suspect anemia slightly worse due to IVF dilution.  B12/folate level stable.  Chronic bilateral lower extremity pressure ulcers: Present prior to [facility] care  consult.  No indication of infection at this point.  Renal cyst/mass upper pole of right kidney: Stable for outpatient follow-up.  Incidental finding.  Severe protein calorie malnutrition: On PEG tube feeds.  Severe failure to thrive syndrome: Bed bound-dependent on family for all activities of daily living.  Palliative care: Followed by hospice-however remains a full code.  Long discussion with son/daughter-with hospice RN from [facility] in the room-they realized that he is not a candidate for aggressive care-but at this time want to continue full CODE STATUS.  BMI: Estimated body mass index is 20.11 kg/m as calculated from the following:   Height as of this encounter: '5\' 8"'$  (1.727 m).   Weight as of this encounter: 60 kg.   Code status:   Code Status: Full Code   DVT Prophylaxis: SCDs Start: 10/14/21 0801   Family Communication: Long discussion with son/daughter at bedside.  Disposition Plan: Status is: Inpatient Remains inpatient appropriate because: AKI-not yet stable for discharge.   Planned Discharge Destination:Home   Diet: Diet Order             Diet NPO time specified  Diet effective now                     Antimicrobial agents: Anti-infectives (From admission, onward)    None        MEDICATIONS: Scheduled Meds:  atorvastatin  40 mg Per  Tube Daily   docusate  100 mg Per Tube Daily   famotidine  20 mg Per Tube QHS   feeding supplement (NEPRO CARB STEADY)  197-395 mL Per Tube TID   ferrous sulfate  300 mg Per Tube Daily   ipratropium-albuterol  3 mL Nebulization BID   levothyroxine  75 mcg Per Tube Q0600   mouth rinse  15 mL Mouth Rinse 4 times per day   polyethylene glycol  17 g Per Tube Daily   potassium chloride  40 mEq Per Tube Once   traMADol  50 mg Per Tube QHS   Continuous Infusions:  sodium chloride     PRN Meds:.ipratropium-albuterol, mouth rinse   I have personally reviewed following labs and imaging studies  LABORATORY  DATA: CBC: Recent Labs  Lab 10/14/21 0315 10/15/21 0349  WBC 7.7 8.5  NEUTROABS 6.1  --   HGB 7.4* 7.6*  HCT 23.7* 24.7*  MCV 103.0* 102.9*  PLT 102* 119*    Basic Metabolic Panel: Recent Labs  Lab 10/14/21 0242 10/14/21 0824 10/14/21 1152 10/15/21 0349  NA 144 146* 147* 146*  K 3.3* 3.3* 3.5 3.3*  CL 104 107 106 107  CO2 '28 26 27 25  '$ GLUCOSE 140* 106* 115* 114*  BUN 166* 162* 159* 158*  CREATININE 2.49* 2.52* 2.53* 2.43*  CALCIUM 9.6 9.4 9.7 9.7    GFR: Estimated Creatinine Clearance: 14.1 mL/min (A) (by C-G formula based on SCr of 2.43 mg/dL (H)).  Liver Function Tests: Recent Labs  Lab 10/14/21 0242  AST 36  ALT 25  ALKPHOS 88  BILITOT 0.8  PROT 7.9  ALBUMIN 3.8   No results for input(s): "LIPASE", "AMYLASE" in the last 168 hours. Recent Labs  Lab 10/15/21 0354  AMMONIA 47*    Coagulation Profile: No results for input(s): "INR", "PROTIME" in the last 168 hours.  Cardiac Enzymes: No results for input(s): "CKTOTAL", "CKMB", "CKMBINDEX", "TROPONINI" in the last 168 hours.  BNP (last 3 results) No results for input(s): "PROBNP" in the last 8760 hours.  Lipid Profile: No results for input(s): "CHOL", "HDL", "LDLCALC", "TRIG", "CHOLHDL", "LDLDIRECT" in the last 72 hours.  Thyroid Function Tests: Recent Labs    10/14/21 0528  TSH 6.109*  FREET4 0.98    Anemia Panel: Recent Labs    10/14/21 0830  VITAMINB12 955*  FOLATE >40.0    Urine analysis:    Component Value Date/Time   COLORURINE YELLOW 10/14/2021 0242   APPEARANCEUR HAZY (A) 10/14/2021 0242   LABSPEC 1.012 10/14/2021 0242   PHURINE 5.0 10/14/2021 0242   GLUCOSEU NEGATIVE 10/14/2021 0242   HGBUR NEGATIVE 10/14/2021 0242   BILIRUBINUR NEGATIVE 10/14/2021 0242   KETONESUR NEGATIVE 10/14/2021 0242   PROTEINUR NEGATIVE 10/14/2021 0242   NITRITE NEGATIVE 10/14/2021 0242   LEUKOCYTESUR NEGATIVE 10/14/2021 0242    Sepsis Labs: Lactic Acid, Venous    Component Value Date/Time    LATICACIDVEN 0.9 10/14/2021 0528    MICROBIOLOGY: Recent Results (from the past 240 hour(s))  Blood culture (routine x 2)     Status: None (Preliminary result)   Collection Time: 10/14/21  2:42 AM   Specimen: BLOOD RIGHT FOREARM  Result Value Ref Range Status   Specimen Description BLOOD RIGHT FOREARM  Final   Special Requests   Final    BOTTLES DRAWN AEROBIC AND ANAEROBIC Blood Culture adequate volume   Culture   Final    NO GROWTH < 12 HOURS Performed at [facility], [place],  [place] [address]    Report Status PENDING  Incomplete  Resp Panel by RT-PCR (Flu A&B, Covid)     Status: None   Collection Time: 10/14/21  2:42 AM   Specimen: Nasal Swab  Result Value Ref Range Status   SARS Coronavirus 2 by RT PCR NEGATIVE NEGATIVE Final    Comment: (NOTE) SARS-CoV-2 target nucleic acids are NOT DETECTED.  The SARS-CoV-2 RNA is generally detectable in upper respiratory specimens during the acute phase of infection. The lowest concentration of SARS-CoV-2 viral copies this assay can detect is 138 copies/mL. A negative result does not preclude SARS-Cov-2 infection and should not be used as the sole basis for treatment or other patient management decisions. A negative result may occur with  improper specimen collection/handling, submission of specimen other than nasopharyngeal swab, presence of viral mutation(s) within the areas targeted by this assay, and inadequate number of viral copies(<138 copies/mL). A negative result must be combined with clinical observations, patient history, and epidemiological information. The expected result is Negative.  Fact Sheet for Patients:  EntrepreneurPulse.com.au  Fact Sheet for Healthcare Providers:  IncredibleEmployment.be  This test is no t yet approved or cleared by the [place] FDA and  has been authorized for detection and/or diagnosis of SARS-CoV-2 by FDA under an Emergency  Use Authorization (EUA). This EUA will remain  in effect (meaning this test can be used) for the duration of the COVID-19 declaration under Section 564(b)(1) of the Act, 21 U.S.C.section 360bbb-3(b)(1), unless the authorization is terminated  or revoked sooner.       Influenza A by PCR NEGATIVE NEGATIVE Final   Influenza B by PCR NEGATIVE NEGATIVE Final    Comment: (NOTE) The Xpert Xpress SARS-CoV-2/FLU/RSV plus assay is intended as an aid in the diagnosis of influenza from Nasopharyngeal swab specimens and should not be used as a sole basis for treatment. Nasal washings and aspirates are unacceptable for Xpert Xpress SARS-CoV-2/FLU/RSV testing.  Fact Sheet for Patients: EntrepreneurPulse.com.au  Fact Sheet for Healthcare Providers: IncredibleEmployment.be  This test is not yet approved or cleared by the [place] FDA and has been authorized for detection and/or diagnosis of SARS-CoV-2 by FDA under an Emergency Use Authorization (EUA). This EUA will remain in effect (meaning this test can be used) for the duration of the COVID-19 declaration under Section 564(b)(1) of the Act, 21 U.S.C. section 360bbb-3(b)(1), unless the authorization is terminated or revoked.  Performed at [facility], [place] [address], [place], [place] [address]   Blood culture (routine x 2)     Status: None (Preliminary result)   Collection Time: 10/14/21  8:31 AM   Specimen: BLOOD LEFT ARM  Result Value Ref Range Status   Specimen Description BLOOD LEFT ARM  Final   Special Requests   Final    BOTTLES DRAWN AEROBIC AND ANAEROBIC Blood Culture adequate volume   Culture   Final    NO GROWTH <12 HOURS Performed at [facility] [address] [place], [place] [address]    Report Status PENDING  Incomplete    RADIOLOGY STUDIES/RESULTS: CT HEAD WO CONTRAST (5MM)  Result Date: 10/15/2021 CLINICAL DATA:  Delirium.  Confusion. EXAM: CT HEAD WITHOUT  CONTRAST TECHNIQUE: Contiguous axial images were obtained from the base of the skull through the vertex without intravenous contrast. RADIATION DOSE REDUCTION: This exam was performed according to the departmental dose-optimization program which includes automated exposure control, adjustment of the mA and/or kV according to patient size and/or use of iterative  reconstruction technique. COMPARISON:  None Available. FINDINGS: Brain: No evidence of acute infarction, hemorrhage, hydrocephalus, extra-axial collection or mass lesion/mass effect. Remote right cerebellar hemisphere infarct. There is mild diffuse low-attenuation within the subcortical and periventricular white matter compatible with chronic microvascular disease. Prominence of sulci and ventricles compatible with brain atrophy. Vascular: No hyperdense vessel or unexpected calcification. Skull: Normal. Negative for fracture or focal lesion. Sinuses/Orbits: Mild mucosal thickening involving the paranasal sinuses. No air-fluid levels. Mastoid air cells are clear. Other: None. IMPRESSION: 1. No acute intracranial abnormalities. 2. Chronic microvascular disease and brain atrophy. 3. Remote right cerebellar hemisphere infarct. Electronically Signed   By: Kerby Moors M.D.   On: 10/15/2021 05:21   CT ABDOMEN PELVIS WO CONTRAST  Result Date: 10/14/2021 CLINICAL DATA:  Extraluminal contrast noted on current abdomen radiograph, which was obtained after injection of contrast into a gastrostomy tube to assess tube position. EXAM: CT ABDOMEN AND PELVIS WITHOUT CONTRAST TECHNIQUE: Multidetector CT imaging of the abdomen and pelvis was performed following the standard protocol without IV contrast. RADIATION DOSE REDUCTION: This exam was performed according to the departmental dose-optimization program which includes automated exposure control, adjustment of the mA and/or kV according to patient size and/or use of iterative reconstruction technique. COMPARISON:   Current abdomen radiograph. FINDINGS: Lower chest: Moderate right and small left pleural effusions. Associated dependent lung base opacities consistent with atelectasis. Hepatobiliary: Liver normal in size. No mass or focal lesion. Normal gallbladder. No bile duct dilation. Pancreas: Unremarkable. No pancreatic ductal dilatation or surrounding inflammatory changes. Spleen: Normal in size without focal abnormality. Adrenals/Urinary Tract: No adrenal masses. Kidneys are normal in position. Bilateral renal cortical thinning. Possible mass/cyst from the anterior upper pole the right kidney, proximally 1.7 cm in size, not fully defined. No other evidence of a renal mass. No stone. No hydronephrosis. Ureters are normal in course and in caliber. Bladder is distended, otherwise unremarkable. Stomach/Bowel: Percutaneous gastrostomy tube is well positioned within the anterior mid stomach. Stomach is mostly decompressed. There is no extraluminal contrast as was suggested on the current radiograph. No adjacent inflammation. There is a small soft tissue mass that projects along the inferior wall of the 3rd portion of the duodenum. This measures 2.2 x 2.0 x 2.2 cm. Small bowel and colon are normal in caliber. No wall thickening. No inflammation. Vascular/Lymphatic: Aortic atherosclerosis. No aneurysm. No convincing enlarged lymph nodes. Reproductive: Prominent prostate, 4.6 x 3.8 cm transversely. Other: No ascites. Musculoskeletal: No fracture or acute finding. No bone lesion. Diffuse skeletal demineralization. IMPRESSION: 1. Well-positioned percutaneous gastrostomy tube. No extraluminal contrast. 2. Abnormal 2.3 cm soft tissue projecting along the inferior wall of the third portion of the duodenum. This could reflect a mass arising from the duodenum such as a leiomyoma. It could potentially reflect an enlarged retroperitoneal lymph node indenting the duodenum. This could be further assessed with endoscopy. 3. Possible mass from  the upper pole the right kidney. A possible cystic lesion was noted in the upper right kidney on an ultrasound dated 03/02/2021. Consider follow-up renal ultrasound for further assessment. 4. Moderate right and small left pleural effusions with associated dependent atelectasis. 5. Aortic atherosclerosis. Electronically Signed   By: Lajean Manes M.D.   On: 10/14/2021 11:09   DG ABDOMEN PEG TUBE LOCATION  Result Date: 10/14/2021 CLINICAL DATA:  Peg tube not function. EXAM: ABDOMEN - 1 VIEW COMPARISON:  07/31/2021 FINDINGS: Gastrostomy tube overlies the left abdomen. Contrast material in the gastric lumen suggests recent contrast injection via the gastrostomy tube.  Contrast material is also seen in the proximal duodenum. There is a collection of contrast inferior to the gastric antrum that may be related to a distended antral region of the stomach, but extraluminal contrast pool at this location cannot be excluded. IMPRESSION: 1. Collection of contrast inferior to the gastric antrum may be related to distention of the antral region of the stomach, but extraluminal contrast pool is not excluded. CT abdomen recommended to further evaluate. Electronically Signed   By: Misty Stanley M.D.   On: 10/14/2021 09:02   DG Chest Port 1 View  Result Date: 10/14/2021 CLINICAL DATA:  Shortness of breath, fever, weakness. EXAM: PORTABLE CHEST 1 VIEW COMPARISON:  08/14/2021 FINDINGS: Heart is enlarged and the mediastinal contour is stable. A TAVR stent is noted. There is atherosclerotic calcification of the aorta. A multi lead pacemaker device is present over the left chest. Pulmonary vasculature is mildly distended. There are small bilateral pleural effusions with atelectasis or infiltrate at the left lung base. No pneumothorax. IMPRESSION: 1. Cardiomegaly with mildly distended pulmonary vasculature. 2. Mild atelectasis, edema, or infiltrate at the left lung base. 3. Small bilateral pleural effusions. Electronically Signed    By: Brett Fairy M.D.   On: 10/14/2021 02:39     LOS: 0 days   Oren Binet, MD  [facility]    To contact the attending provider between 7A-7P or the covering provider during after hours 7P-7A, please log into the web site [email or web address] and access using universal [place] password for that web site. If you do not have the password, please call the hospital operator.  10/15/2021, 9:37 AM

## 2021-10-15 NOTE — Progress Notes (Signed)
Around 1925, the patient was alert and oriented (AO) x2. The daughter was at the bedside stating the patient had a pattern of being confused at night time at home, but not as early in the evening as today.  This nurse notified Poplowski, MD and Marlowe Sax, MD.  When the patient was re-evaluated, this nurse found him to be AO x3 with a monitor BP 210/180, then subsequently, a manual BP of 144/56.  Per the family sitter at the bedside, his current presentation was his baseline; however, at [place], the sitter stated that he was more confused than usual because he could not state the current year.  This patient could not state the current year when first evaluated by this nurse with the daughter at the bedside.  Dr. Marlowe Sax was made aware of the contradicting statements made via the bedside sitter.  Per Marlowe Sax, this nurse is to continue to monitor the patient, stating that his mild confusion was likely due to metabolic reasons and his age.  Anselm Pancoast, RRRN was also made aware of the situation, and she recommended this nurse to obtain a blood sugar. The blood sugar was 132.  Will continue to monitor patient per orders given via Dr. Marlowe Sax.

## 2021-10-15 NOTE — Progress Notes (Signed)
Pt went to CT

## 2021-10-16 DIAGNOSIS — J9611 Chronic respiratory failure with hypoxia: Secondary | ICD-10-CM | POA: Diagnosis not present

## 2021-10-16 DIAGNOSIS — R1312 Dysphagia, oropharyngeal phase: Secondary | ICD-10-CM | POA: Diagnosis not present

## 2021-10-16 DIAGNOSIS — D649 Anemia, unspecified: Secondary | ICD-10-CM | POA: Diagnosis not present

## 2021-10-16 DIAGNOSIS — N179 Acute kidney failure, unspecified: Secondary | ICD-10-CM | POA: Diagnosis not present

## 2021-10-16 LAB — BASIC METABOLIC PANEL
Anion gap: 13 (ref 5–15)
BUN: 145 mg/dL — ABNORMAL HIGH (ref 8–23)
CO2: 27 mmol/L (ref 22–32)
Calcium: 9.8 mg/dL (ref 8.9–10.3)
Chloride: 113 mmol/L — ABNORMAL HIGH (ref 98–111)
Creatinine, Ser: 2.21 mg/dL — ABNORMAL HIGH (ref 0.61–1.24)
GFR, Estimated: 26 mL/min — ABNORMAL LOW (ref >60)
Glucose, Bld: 122 mg/dL — ABNORMAL HIGH (ref 70–99)
Potassium: 3.9 mmol/L (ref 3.5–5.1)
Sodium: 153 mmol/L — ABNORMAL HIGH (ref 135–145)

## 2021-10-16 LAB — GLUCOSE, CAPILLARY
Glucose-Capillary: 107 mg/dL — ABNORMAL HIGH (ref 70–99)
Glucose-Capillary: 109 mg/dL — ABNORMAL HIGH (ref 70–99)
Glucose-Capillary: 122 mg/dL — ABNORMAL HIGH (ref 70–99)
Glucose-Capillary: 125 mg/dL — ABNORMAL HIGH (ref 70–99)
Glucose-Capillary: 99 mg/dL (ref 70–99)

## 2021-10-16 LAB — CBC
HCT: 24.4 % — ABNORMAL LOW (ref 39.0–52.0)
Hemoglobin: 7.5 g/dL — ABNORMAL LOW (ref 13.0–17.0)
MCH: 32.1 pg (ref 26.0–34.0)
MCHC: 30.7 g/dL (ref 30.0–36.0)
MCV: 104.3 fL — ABNORMAL HIGH (ref 80.0–100.0)
Platelets: 117 10*3/uL — ABNORMAL LOW (ref 150–400)
RBC: 2.34 MIL/uL — ABNORMAL LOW (ref 4.22–5.81)
RDW: 18.6 % — ABNORMAL HIGH (ref 11.5–15.5)
WBC: 10.4 10*3/uL (ref 4.0–10.5)
nRBC: 0 % (ref 0.0–0.2)

## 2021-10-16 MED ORDER — METOCLOPRAMIDE HCL 5 MG/ML IJ SOLN
5.0000 mg | Freq: Three times a day (TID) | INTRAMUSCULAR | Status: AC
  Administered 2021-10-16 – 2021-10-17 (×3): 5 mg via INTRAVENOUS
  Filled 2021-10-16 (×3): qty 2

## 2021-10-16 MED ORDER — FREE WATER
150.0000 mL | Freq: Three times a day (TID) | Status: DC
Start: 2021-10-16 — End: 2021-10-16

## 2021-10-16 MED ORDER — SODIUM CHLORIDE 0.45 % IV SOLN: INTRAVENOUS | Status: AC

## 2021-10-16 MED ORDER — FREE WATER
300.0000 mL | Freq: Three times a day (TID) | Status: DC
Start: 2021-10-16 — End: 2021-10-21
  Administered 2021-10-16 – 2021-10-20 (×12): 300 mL

## 2021-10-16 NOTE — Progress Notes (Signed)
Transition of Care Department [facility]) following patient for high risk of readmission.   Patient active with [facility] hospice patient with a terminal diagnosis of Congestive Heart Failure. Admitted to hospital for acute kidney injury.  Transition of Care Department [facility]) has reviewed patient and we will continue to monitor patient advancement through interdisciplinary progression rounds.

## 2021-10-16 NOTE — Progress Notes (Signed)
[facility] [address] [facility]) Hospitalized Hospice Patient   Mr. Lama is a current [facility] hospice patient with a terminal diagnosis of Congestive Heart Failure. On the evening of 9.1.2023, pt had complaints of feeling hot and feeling "tired and bad all over". Hospice RN made on-call visit, son reported patient had received 2 feedings through his G-tube, "but nothing else had seemed to fit". RN noted abdomen to be distended with hypoactive bowel sounds in all 4 quadrants. Last reported BM 8.30. Enema was recommended but son elected to wait until he had help the following day. Recommended continuing Miralax and Colace. Early morning of 9.2.2023, patient requested to go to [facility] as "he felt bad". Son called and notified hospice, hospice on-call informed son we could not request transport to a certain hospital; son stated he would discuss with his father. Hospice was notified 9.2.2023 of hospital admission with a diagnosis of Acute Kidney Injury and anemia. Per Dr. Hollace Kinnier with [facility], this is a related hospital admission.  Visited patient at the bedside. He is alert, appears uncomfortable, but he states his breathing is better than yesterday. Daughter [name] at the bedside. Patient has been more confused and fidgeting with his covers.   Mr. Hartshorne is inpatient appropriate for treatment of AKI, requiring IV hydration.   V/S: 97.9 oral, 122/57, HR 69, RR 18, SPO2 96% on 3 lpm via [place] I&O: no input recorded, 2450 out Labs: Na 153, BUN 145, Cr 2.21, gfr 26, RBC 2.35, hgb 7.5, HCT 24.2, plt 117  IV/PRN: reglan 5 mg IV TID, 1/2 NS @ 75 ml/hr Diagnostics: none today   Problem List Per Attendings' Note: AKI on CKD stage IIIa: Suspect hemodynamically mediated due to use of furosemide/Bactrim.  Slowly improving-continue IV fluid cautiously-increase free water via PEG tube.  Long discussion with family at bedside on 9/3-all aware that he is not a candidate for aggressive care-not a  good candidate for long-term HD.  Thankfully no acute indications for HD at this point.  Excellent urine output overnight.  Acute metabolic encephalopathy: Improved-likely due to AKI.  Family aware that he will continue to have episodes of delirium while hospitalized and out of his family surroundings.  Per family-he tends to get confused at home at night as well.  Maintain delirium precautions.  Hypernatremia: Slightly worse-change to half-normal-increase free water via PEG tube.    GOC: Ongoing. He is a full code and the patient and family are not interested in discussing further. D/C planning: Return home with hospice support. Family: Updated at the bedside IDT: Hospice team updated.  Transfer summary and med list to shadow chart.   Once ready for discharge, please use [facility] for ambulance transport as they contract this service for our active hospice patients.  Venia Carbon DNP, RN [facility] Liaison  (Epic chat, AMION under hospice for daily coverage)

## 2021-10-16 NOTE — Progress Notes (Addendum)
PROGRESS NOTE        PATIENT DETAILS Name: Gregory [name] Age: 88 y.o. Sex: male Date of Birth: 06/11/1920 Admit Date: 10/14/2021 Admitting Physician Evalee Mutton Kristeen Mans, MD JFH:LKTG, [name], DO  Brief Summary: Patient is a 88 y.o.  male with history of dysphagia-s/p PEG tube placement, chronic hypoxic respiratory failure on 2-3 L of oxygen at all times, benign duodenal mass, chronic HFpEF, CKD stage IIIa, severe aortic stenosis s/p TAVR September 2020, chronic atrial fibrillation (not on anticoagulation due to bleeding risk), CAD being managed medically, PAD-who is followed at home by hospice (remains a full code)-brought to the hospital for generalized weakness-family thought his breathing was somewhat labored-upon further evaluation-he was found to have AKI and subsequently admitted to the hospitalist service.  See below for further details.  Significant events: 9/2>> fatigue/generalized weakness-found to have AKI-admit to [facility].  Significant studies: 9/2>> CXR: Atelectasis at left lung base, small bilateral pleural effusions. 9/2>> CT abdomen/pelvis: Well positioned gastrostomy tube-2.3 cm soft tissue projecting into the inferior wall of the third portion of the duodenum.  Possible mass in the upper pole of right kidney.  Moderate right/small left pleural effusion. 9/3>> CT head: No acute intracranial abnormalities.  Significant microbiology data: 9/2>> blood cultures: No growth 9/2>> COVID/influenza PCR: Negative  Procedures: None  Consults: None  Subjective: No major issues overnight.  Appears unchanged compared to yesterday.  Son/daughter at bedside.  Per nursing staff-had some mild delirium overnight.  Objective: Vitals: Blood pressure 123/68, pulse 73, temperature 97.9 F (36.6 C), temperature source Axillary, resp. rate 20, height '5\' 8"'$  (1.727 m), weight 60 kg, SpO2 100 %.   Exam: Gen Exam: Frail-chronically sick appearing-not in any  distress. HEENT:atraumatic, normocephalic Chest: B/L clear to auscultation anteriorly CVS:S1S2 regular Abdomen:soft non tender, non distended.  PEG tube in place. Extremities:no edema Neurology: Non focal Skin: no rash   Pertinent Labs/Radiology:    Latest Ref Rng & Units 10/16/2021    3:17 AM 10/15/2021    3:49 AM 10/14/2021    3:15 AM  CBC  WBC 4.0 - 10.5 K/uL 10.4  8.5  7.7   Hemoglobin 13.0 - 17.0 g/dL 7.5  7.6  7.4   Hematocrit 39.0 - 52.0 % 24.4  24.7  23.7   Platelets 150 - 400 K/uL 117  119  102     Lab Results  Component Value Date   NA 153 (H) 10/16/2021   K 3.9 10/16/2021   CL 113 (H) 10/16/2021   CO2 27 10/16/2021     Assessment/Plan: AKI on CKD stage IIIa: Suspect hemodynamically mediated due to use of furosemide/Bactrim.  Slowly improving-continue IV fluid cautiously-increase free water via PEG tube.  Long discussion with family at bedside on 9/3-all aware that he is not a candidate for aggressive care-not a good candidate for long-term HD.  Thankfully no acute indications for HD at this point.  Excellent urine output overnight.  Right sided pleural effusion: This is a chronic issue-likely a transudate in the setting of known HFpEF.  He has no symptoms from this effusion.  Do not think a thoracocentesis will provide any clinical benefit.  Risks currently outweigh any benefits.  After discussion with son/daughter at bedside on 9/3-plan thoracocentesis was canceled.  Acute metabolic encephalopathy: Improved-likely due to AKI.  Family aware that he will continue to have episodes of delirium while hospitalized  and out of his family surroundings.  Per family-he tends to get confused at home at night as well.  Maintain delirium precautions.  Known duodenal mass: Per review of prior records in epic-this has been biopsied in [facility] is benign.  Review of CT scan done on 9/2-does not show any gastric distention (no history of vomiting as well)-and contrast seems to  have passed to his small intestine.  No signs of gastric outlet obstruction.  Son reports that feeding via gravity is slow-I suspect that may be from age related gastroparesis.  Son/daughter aware that no plans for any endoscopic evaluation/surgical evaluation-since this is asymptomatic at this point-he would not be a candidate for surgical resection in any event.  Chronic HFpEF: Remains euvolemic-continue to watch closely while he is on IVF.    Permanent atrial fibrillation: Continue telemetry monitoring-review of outpatient cardiology note-indicates that patient is not a anticoagulation candidate due to bleeding risk.  On Plavix.    History of complete heart block-s/p PPM placement: Continue telemetry monitoring  Dysphagia: Has been n.p.o. since January 2023-dependent on PEG tube feedings.  Chronic hypoxic respiratory failure on 2-3 L of oxygen at baseline: Stable on 2-3 L of oxygen this morning.  Lying flat.  Not in any respiratory distress.  PAD: Continue Plavix  Hypokalemia: Mild-replete and recheck.  Hypernatremia: Slightly worse-change to half-normal-increase free water via PEG tube.    Hypothyroidism: Continue Synthroid-TSH minimally elevated-stable for PCP to repeat TSH in 4 to 6 weeks.  Macrocytic anemia: Chronic issue-likely secondary to CKD-suspect anemia slightly worse due to IVF dilution.  B12/folate level stable.  Chronic bilateral lower extremity pressure ulcers: Present prior to [facility] care consult.  No indication of infection at this point.  Renal cyst/mass upper pole of right kidney: Stable for outpatient follow-up.  Incidental finding.  Severe protein calorie malnutrition: On PEG tube feeds.  Severe failure to thrive syndrome: Bed bound-dependent on family for all activities of daily living.  Palliative care: Followed by hospice-however remains a full code.  Long discussion with son/daughter-with hospice RN from [facility] in the room-they realized that he  is not a candidate for aggressive care-but at this time want to continue full CODE STATUS.  BMI: Estimated body mass index is 20.11 kg/m as calculated from the following:   Height as of this encounter: '5\' 8"'$  (1.727 m).   Weight as of this encounter: 60 kg.   Code status:   Code Status: Full Code   DVT Prophylaxis: SCDs Start: 10/14/21 0801   Family Communication: Long discussion with son/daughter at bedside.  Disposition Plan: Status is: Inpatient Remains inpatient appropriate because: AKI-not yet stable for discharge.   Planned Discharge Destination:Home   Diet: Diet Order             Diet NPO time specified  Diet effective now                     Antimicrobial agents: Anti-infectives (From admission, onward)    None        MEDICATIONS: Scheduled Meds:  atorvastatin  40 mg Per Tube Daily   clopidogrel  75 mg Per Tube Daily   docusate  100 mg Per Tube Daily   famotidine  20 mg Per Tube QHS   feeding supplement (NEPRO CARB STEADY)  315 mL Per Tube TID BM   ferrous sulfate  300 mg Per Tube Daily   free water  300 mL Per Tube Q8H   ipratropium-albuterol  3 mL Nebulization  BID   levothyroxine  75 mcg Per Tube Q0600   mouth rinse  15 mL Mouth Rinse 4 times per day   polyethylene glycol  17 g Per Tube Daily   traMADol  50 mg Per Tube QHS   Continuous Infusions:  sodium chloride 75 mL/hr at 10/16/21 0853   PRN Meds:.haloperidol **OR** haloperidol lactate, ipratropium-albuterol, mouth rinse   I have personally reviewed following labs and imaging studies  LABORATORY DATA: CBC: Recent Labs  Lab 10/14/21 0315 10/15/21 0349 10/16/21 0317  WBC 7.7 8.5 10.4  NEUTROABS 6.1  --   --   HGB 7.4* 7.6* 7.5*  HCT 23.7* 24.7* 24.4*  MCV 103.0* 102.9* 104.3*  PLT 102* 119* 117*     Basic Metabolic Panel: Recent Labs  Lab 10/14/21 0242 10/14/21 0824 10/14/21 1152 10/15/21 0349 10/16/21 0317  NA 144 146* 147* 146* 153*  K 3.3* 3.3* 3.5 3.3* 3.9   CL 104 107 106 107 113*  CO2 '28 26 27 25 27  '$ GLUCOSE 140* 106* 115* 114* 122*  BUN 166* 162* 159* 158* 145*  CREATININE 2.49* 2.52* 2.53* 2.43* 2.21*  CALCIUM 9.6 9.4 9.7 9.7 9.8     GFR: Estimated Creatinine Clearance: 15.5 mL/min (A) (by C-G formula based on SCr of 2.21 mg/dL (H)).  Liver Function Tests: Recent Labs  Lab 10/14/21 0242  AST 36  ALT 25  ALKPHOS 88  BILITOT 0.8  PROT 7.9  ALBUMIN 3.8    No results for input(s): "LIPASE", "AMYLASE" in the last 168 hours. Recent Labs  Lab 10/15/21 0354  AMMONIA 47*     Coagulation Profile: No results for input(s): "INR", "PROTIME" in the last 168 hours.  Cardiac Enzymes: No results for input(s): "CKTOTAL", "CKMB", "CKMBINDEX", "TROPONINI" in the last 168 hours.  BNP (last 3 results) No results for input(s): "PROBNP" in the last 8760 hours.  Lipid Profile: No results for input(s): "CHOL", "HDL", "LDLCALC", "TRIG", "CHOLHDL", "LDLDIRECT" in the last 72 hours.  Thyroid Function Tests: Recent Labs    10/14/21 0528  TSH 6.109*  FREET4 0.98     Anemia Panel: Recent Labs    10/14/21 0830  VITAMINB12 955*  FOLATE >40.0     Urine analysis:    Component Value Date/Time   COLORURINE YELLOW 10/14/2021 0242   APPEARANCEUR HAZY (A) 10/14/2021 0242   LABSPEC 1.012 10/14/2021 0242   PHURINE 5.0 10/14/2021 0242   GLUCOSEU NEGATIVE 10/14/2021 0242   HGBUR NEGATIVE 10/14/2021 0242   BILIRUBINUR NEGATIVE 10/14/2021 0242   KETONESUR NEGATIVE 10/14/2021 0242   PROTEINUR NEGATIVE 10/14/2021 0242   NITRITE NEGATIVE 10/14/2021 0242   LEUKOCYTESUR NEGATIVE 10/14/2021 0242    Sepsis Labs: Lactic Acid, Venous    Component Value Date/Time   LATICACIDVEN 0.9 10/14/2021 0528    MICROBIOLOGY: Recent Results (from the past 240 hour(s))  Blood culture (routine x 2)     Status: None (Preliminary result)   Collection Time: 10/14/21  2:42 AM   Specimen: BLOOD RIGHT FOREARM  Result Value Ref Range Status   Specimen  Description BLOOD RIGHT FOREARM  Final   Special Requests   Final    BOTTLES DRAWN AEROBIC AND ANAEROBIC Blood Culture adequate volume   Culture   Final    NO GROWTH 2 DAYS Performed at [facility], [place] [address] [place], [place] [address]    Report Status PENDING  Incomplete  Urine Culture     Status: Abnormal   Collection Time: 10/14/21  2:42 AM  Specimen: Urine, Clean Catch  Result Value Ref Range Status   Specimen Description URINE, CLEAN CATCH  Final   Special Requests   Final    NONE Performed at [facility], [address], [place], [place] [address]    Culture MULTIPLE SPECIES PRESENT, SUGGEST RECOLLECTION (A)  Final   Report Status 10/15/2021 FINAL  Final  Resp Panel by RT-PCR (Flu A&B, Covid)     Status: None   Collection Time: 10/14/21  2:42 AM   Specimen: Nasal Swab  Result Value Ref Range Status   SARS Coronavirus 2 by RT PCR NEGATIVE NEGATIVE Final    Comment: (NOTE) SARS-CoV-2 target nucleic acids are NOT DETECTED.  The SARS-CoV-2 RNA is generally detectable in upper respiratory specimens during the acute phase of infection. The lowest concentration of SARS-CoV-2 viral copies this assay can detect is 138 copies/mL. A negative result does not preclude SARS-Cov-2 infection and should not be used as the sole basis for treatment or other patient management decisions. A negative result may occur with  improper specimen collection/handling, submission of specimen other than nasopharyngeal swab, presence of viral mutation(s) within the areas targeted by this assay, and inadequate number of viral copies(<138 copies/mL). A negative result must be combined with clinical observations, patient history, and epidemiological information. The expected result is Negative.  Fact Sheet for Patients:  EntrepreneurPulse.com.au  Fact Sheet for Healthcare Providers:  IncredibleEmployment.be  This test is no t yet approved or  cleared by the [place] FDA and  has been authorized for detection and/or diagnosis of SARS-CoV-2 by FDA under an Emergency Use Authorization (EUA). This EUA will remain  in effect (meaning this test can be used) for the duration of the COVID-19 declaration under Section 564(b)(1) of the Act, 21 U.S.C.section 360bbb-3(b)(1), unless the authorization is terminated  or revoked sooner.       Influenza A by PCR NEGATIVE NEGATIVE Final   Influenza B by PCR NEGATIVE NEGATIVE Final    Comment: (NOTE) The Xpert Xpress SARS-CoV-2/FLU/RSV plus assay is intended as an aid in the diagnosis of influenza from Nasopharyngeal swab specimens and should not be used as a sole basis for treatment. Nasal washings and aspirates are unacceptable for Xpert Xpress SARS-CoV-2/FLU/RSV testing.  Fact Sheet for Patients: EntrepreneurPulse.com.au  Fact Sheet for Healthcare Providers: IncredibleEmployment.be  This test is not yet approved or cleared by the [place] FDA and has been authorized for detection and/or diagnosis of SARS-CoV-2 by FDA under an Emergency Use Authorization (EUA). This EUA will remain in effect (meaning this test can be used) for the duration of the COVID-19 declaration under Section 564(b)(1) of the Act, 21 U.S.C. section 360bbb-3(b)(1), unless the authorization is terminated or revoked.  Performed at [facility], [place] [address], [place], [place] [address]   Blood culture (routine x 2)     Status: None (Preliminary result)   Collection Time: 10/14/21  8:31 AM   Specimen: BLOOD LEFT ARM  Result Value Ref Range Status   Specimen Description BLOOD LEFT ARM  Final   Special Requests   Final    BOTTLES DRAWN AEROBIC AND ANAEROBIC Blood Culture adequate volume   Culture   Final    NO GROWTH 2 DAYS Performed at [place] [address], [place], [place] [address]    Report Status PENDING  Incomplete    RADIOLOGY  STUDIES/RESULTS: CT HEAD WO CONTRAST (5MM)  Result Date: 10/15/2021 CLINICAL DATA:  Delirium.  Confusion. EXAM: CT  HEAD WITHOUT CONTRAST TECHNIQUE: Contiguous axial images were obtained from the base of the skull through the vertex without intravenous contrast. RADIATION DOSE REDUCTION: This exam was performed according to the departmental dose-optimization program which includes automated exposure control, adjustment of the mA and/or kV according to patient size and/or use of iterative reconstruction technique. COMPARISON:  None Available. FINDINGS: Brain: No evidence of acute infarction, hemorrhage, hydrocephalus, extra-axial collection or mass lesion/mass effect. Remote right cerebellar hemisphere infarct. There is mild diffuse low-attenuation within the subcortical and periventricular white matter compatible with chronic microvascular disease. Prominence of sulci and ventricles compatible with brain atrophy. Vascular: No hyperdense vessel or unexpected calcification. Skull: Normal. Negative for fracture or focal lesion. Sinuses/Orbits: Mild mucosal thickening involving the paranasal sinuses. No air-fluid levels. Mastoid air cells are clear. Other: None. IMPRESSION: 1. No acute intracranial abnormalities. 2. Chronic microvascular disease and brain atrophy. 3. Remote right cerebellar hemisphere infarct. Electronically Signed   By: Kerby Moors M.D.   On: 10/15/2021 05:21     LOS: 1 day   Oren Binet, MD  [facility]    To contact the attending provider between 7A-7P or the covering provider during after hours 7P-7A, please log into the web site [email or web address] and access using universal [place] password for that web site. If you do not have the password, please call the hospital operator.  10/16/2021, 1:49 PM

## 2021-10-16 NOTE — Progress Notes (Signed)
Patient had 100 ml of residual in peg tube. Notified MD, new orders received.

## 2021-10-17 ENCOUNTER — Inpatient Hospital Stay (HOSPITAL_COMMUNITY)

## 2021-10-17 DIAGNOSIS — R1312 Dysphagia, oropharyngeal phase: Secondary | ICD-10-CM

## 2021-10-17 DIAGNOSIS — N179 Acute kidney failure, unspecified: Secondary | ICD-10-CM | POA: Diagnosis not present

## 2021-10-17 DIAGNOSIS — J9611 Chronic respiratory failure with hypoxia: Secondary | ICD-10-CM

## 2021-10-17 DIAGNOSIS — D649 Anemia, unspecified: Secondary | ICD-10-CM | POA: Diagnosis not present

## 2021-10-17 DIAGNOSIS — N189 Chronic kidney disease, unspecified: Secondary | ICD-10-CM

## 2021-10-17 LAB — BASIC METABOLIC PANEL
Anion gap: 9 (ref 5–15)
BUN: 132 mg/dL — ABNORMAL HIGH (ref 8–23)
CO2: 26 mmol/L (ref 22–32)
Calcium: 9.4 mg/dL (ref 8.9–10.3)
Chloride: 116 mmol/L — ABNORMAL HIGH (ref 98–111)
Creatinine, Ser: 2.07 mg/dL — ABNORMAL HIGH (ref 0.61–1.24)
GFR, Estimated: 28 mL/min — ABNORMAL LOW (ref >60)
Glucose, Bld: 113 mg/dL — ABNORMAL HIGH (ref 70–99)
Potassium: 3.9 mmol/L (ref 3.5–5.1)
Sodium: 151 mmol/L — ABNORMAL HIGH (ref 135–145)

## 2021-10-17 LAB — GLUCOSE, CAPILLARY
Glucose-Capillary: 100 mg/dL — ABNORMAL HIGH (ref 70–99)
Glucose-Capillary: 114 mg/dL — ABNORMAL HIGH (ref 70–99)
Glucose-Capillary: 116 mg/dL — ABNORMAL HIGH (ref 70–99)
Glucose-Capillary: 122 mg/dL — ABNORMAL HIGH (ref 70–99)
Glucose-Capillary: 92 mg/dL (ref 70–99)
Glucose-Capillary: 95 mg/dL (ref 70–99)

## 2021-10-17 MED ORDER — HEPARIN SODIUM (PORCINE) 5000 UNIT/ML IJ SOLN
5000.0000 [IU] | Freq: Three times a day (TID) | INTRAMUSCULAR | Status: DC
  Administered 2021-10-17 – 2021-10-20 (×9): 5000 [IU] via SUBCUTANEOUS
  Filled 2021-10-17 (×9): qty 1

## 2021-10-17 NOTE — Progress Notes (Signed)
[name]) Hospital liaison note   [name] is a current [facility] hospice patient with a terminal diagnosis of Congestive Heart Failure. On Friday, 9.1.2023, patient complained of feeling hot and "tired and bad all over". Hospice RN made on-call visit, son reported patient had received 2 feedings through his G-tube, "but nothing else had seemed to fit". RN noted distended abdomen and hypoactive bowel sounds in all 4 quadrants. Last reported BM 8.30. Enema recommended and to continue Miralax and Colace. Son decided to wait to administer enema until he had help the following day. Early morning of 9.2.2023, patient requested to go to [facility] as "he felt bad". Son called and notified hospice, hospice on-call informed son we could not request transport to a certain hospital; son stated he would discuss with his father. Hospice was notified 9.2.2023 of hospital admission with a diagnosis of Acute Kidney Injury and anemia. Per Dr. Hollace Kinnier with [facility], this is a related hospital admission.   Visited patient and daughter, Gregory [name] at bedside. Patient was lying in bed, alert and conversant with some confusion, appeared comfortable. Daughter Gregory [name] reported that the doctor had been by and stated that he wanted to see patient's kidney function improve a bit more before he discharged patient. Will follow up tomorrow.    Mr. [name] is inpatient appropriate for treatment of AKI, requiring IV hydration.    V/S: 97.8, 121/98, HR 70, RR 17, SPO2 98% on 3 lpm via [place]  I&O: 1896.4/625  Labs:  BASIC METABOLIC PANEL:  Sodium: 831 (H) Chloride: 116 (H) Glucose: 113 (H) BUN: 132 (H) Creatinine: 2.07 (H) GFR, Estimated: 28 (L)   IV/PRN:  metoCLOPramide (REGLAN) injection 5 mg - q8hrs, IV x 2 0.45 % sodium chloride infusion - 32m/hr, continuous, IV x3 haloperidol lactate (HALDOL) injection 5 mg - q8hrs, PRN, IM x 1  Diagnostics: none today    Problem List: Assessment/Plan: AKI on  CKD stage IIIa: Suspect hemodynamically mediated due to use of furosemide/Bactrim.  Slowly improving with gentle hydration.  Long discussion with family at bedside on 9/3-all aware that he is not a candidate for aggressive care-not a good candidate for long-term HD.  Thankfully no acute indications for HD at this point.  Excellent urine output overnight.   Right sided pleural effusion: This is a chronic issue-likely a transudate in the setting of known HFpEF.  He has no symptoms from this effusion.  Do not think a thoracocentesis will provide any clinical benefit.  Risks currently outweigh any benefits.  After discussion with son/daughter at bedside on 9/3-plan thoracocentesis was canceled.   Acute metabolic encephalopathy: Improved-likely due to AKI.  Family aware that he will continue to have episodes of delirium while hospitalized and out of his family surroundings.  Per family-he tends to get confused at home at night as well.  Maintain delirium precautions.   Known duodenal mass: Per review of prior records in epic[facility]  Review of CT scan done on 9/2-does not show any gastric distention (no history of vomiting as well)-and contrast seems to have passed to his small intestine.  No signs of gastric outlet obstruction.  Son reports that feeding via gravity is slow-I suspect that may be from age related gastroparesis.  Son/daughter aware that no plans for any endoscopic evaluation/surgical evaluation-since this is asymptomatic at this point-he would not be a candidate for surgical resection in any event.   Left forearm soft tissue density: Per family-this  was noted today-nontender-unclear if this is a lipoma or something else similar.  He apparently did not have an IV access at that site-therefore doubt this is related to a vascular issue.  Obtaining soft tissue ultrasound.   Chronic HFpEF: Remains euvolemic-continue to watch closely while he is on  IVF.     Permanent atrial fibrillation: Continue telemetry monitoring-review of outpatient cardiology note-indicates that patient is not a anticoagulation candidate due to bleeding risk.  On Plavix.     History of complete heart block-s/p PPM placement: Continue telemetry monitoring   Dysphagia: Has been n.p.o. since January 2023-dependent on PEG tube feedings.   Chronic hypoxic respiratory failure on 2-3 L of oxygen at baseline: Stable on 2-3 L of oxygen this morning.  Lying flat.  Not in any respiratory distress.   PAD: Continue Plavix   Hypokalemia: Repleted.   Hypernatremia: Stabilized-improving-continue free water via PEG tube and half-normal saline.    Hypothyroidism: Continue Synthroid-TSH minimally elevated-stable for PCP to repeat TSH in 4 to 6 weeks.   Macrocytic anemia: Chronic issue-likely secondary to CKD-suspect anemia slightly worse due to IVF dilution.  B12/folate level stable.   Chronic bilateral lower extremity pressure ulcers: Present prior to [facility] care consult.  No indication of infection at this point.   Renal cyst/mass upper pole of right kidney: Stable for outpatient follow-up.  Incidental finding.   Severe protein calorie malnutrition: On PEG tube feeds.   Severe failure to thrive syndrome: Bed bound-dependent on family for all activities of daily living.   GOC: Ongoing. He is a full code and the patient and family are not interested in discussing further.  D/C planning: Return home with hospice support once kidney function level improves.   Family: Spoke to daughter at bedside.   IDT: Hospice team updated.   Transfer summary and med list to shadow chart.    Once ready for discharge, please use [facility] for ambulance transport as they contract this service for our active hospice patients.  [facility] liaison  [phone]

## 2021-10-17 NOTE — Progress Notes (Signed)
PROGRESS NOTE        PATIENT DETAILS Name: Gregory [name] Age: 88 y.o. Sex: male Date of Birth: Jun 21, 1920 Admit Date: 10/14/2021 Admitting Physician Evalee Mutton Kristeen Mans, MD TKW:IOXB, [name], DO  Brief Summary: Patient is a 88 y.o.  male with history of dysphagia-s/p PEG tube placement, chronic hypoxic respiratory failure on 2-3 L of oxygen at all times, benign duodenal mass, chronic HFpEF, CKD stage IIIa, severe aortic stenosis s/p TAVR September 2020, chronic atrial fibrillation (not on anticoagulation due to bleeding risk), CAD being managed medically, PAD-who is followed at home by hospice (remains a full code)-brought to the hospital for generalized weakness-family thought his breathing was somewhat labored-upon further evaluation-he was found to have AKI and subsequently admitted to the hospitalist service.  See below for further details.  Significant events: 9/2>> fatigue/generalized weakness-found to have AKI-admit to [facility].  Significant studies: 9/2>> CXR: Atelectasis at left lung base, small bilateral pleural effusions. 9/2>> CT abdomen/pelvis: Well positioned gastrostomy tube-2.3 cm soft tissue projecting into the inferior wall of the third portion of the duodenum.  Possible mass in the upper pole of right kidney.  Moderate right/small left pleural effusion. 9/3>> CT head: No acute intracranial abnormalities.  Significant microbiology data: 9/2>> blood cultures: No growth 9/2>> COVID/influenza PCR: Negative  Procedures: None  Consults: None  Subjective: No major issues overnight.  Family concerned about a soft tissue swelling in the left forearm.  Claims that this was noted this morning.  Objective: Vitals: Blood pressure (!) 121/98, pulse 70, temperature (!) 97.2 F (36.2 C), temperature source Axillary, resp. rate 17, height '5\' 8"'$  (1.727 m), weight 68 kg, SpO2 98 %.   Exam: Gen Exam: Frail-chronically sick appearing-not in any  distress. HEENT:atraumatic, normocephalic Chest: B/L clear to auscultation anteriorly CVS:S1S2 regular Abdomen:soft non tender, non distended Extremities:no edema Neurology: GEN lysed weakness-but not focal.   Skin: no rash   Pertinent Labs/Radiology:    Latest Ref Rng & Units 10/16/2021    3:17 AM 10/15/2021    3:49 AM 10/14/2021    3:15 AM  CBC  WBC 4.0 - 10.5 K/uL 10.4  8.5  7.7   Hemoglobin 13.0 - 17.0 g/dL 7.5  7.6  7.4   Hematocrit 39.0 - 52.0 % 24.4  24.7  23.7   Platelets 150 - 400 K/uL 117  119  102     Lab Results  Component Value Date   NA 151 (H) 10/17/2021   K 3.9 10/17/2021   CL 116 (H) 10/17/2021   CO2 26 10/17/2021     Assessment/Plan: AKI on CKD stage IIIa: Suspect hemodynamically mediated due to use of furosemide/Bactrim.  Slowly improving with gentle hydration.  Long discussion with family at bedside on 9/3-all aware that he is not a candidate for aggressive care-not a good candidate for long-term HD.  Thankfully no acute indications for HD at this point.  Excellent urine output overnight.  Right sided pleural effusion: This is a chronic issue-likely a transudate in the setting of known HFpEF.  He has no symptoms from this effusion.  Do not think a thoracocentesis will provide any clinical benefit.  Risks currently outweigh any benefits.  After discussion with son/daughter at bedside on 9/3-plan thoracocentesis was canceled.  Acute metabolic encephalopathy: Improved-likely due to AKI.  Family aware that he will continue to have episodes of delirium while hospitalized and  out of his family surroundings.  Per family-he tends to get confused at home at night as well.  Maintain delirium precautions.  Known duodenal mass: Per review of prior records in epic-[facility].  Review of CT scan done on 9/2-does not show any gastric distention (no history of vomiting as well)-and contrast seems to have passed to his small intestine.   No signs of gastric outlet obstruction.  Son reports that feeding via gravity is slow-I suspect that may be from age related gastroparesis.  Son/daughter aware that no plans for any endoscopic evaluation/surgical evaluation-since this is asymptomatic at this point-he would not be a candidate for surgical resection in any event.  Left forearm soft tissue density: Per family-this was noted today-nontender-unclear if this is a lipoma or something else similar.  He apparently did not have an IV access at that site-therefore doubt this is related to a vascular issue.  Obtaining soft tissue ultrasound.  Chronic HFpEF: Remains euvolemic-continue to watch closely while he is on IVF.    Permanent atrial fibrillation: Continue telemetry monitoring-review of outpatient cardiology note-indicates that patient is not a anticoagulation candidate due to bleeding risk.  On Plavix.    History of complete heart block-s/p PPM placement: Continue telemetry monitoring  Dysphagia: Has been n.p.o. since January 2023-dependent on PEG tube feedings.  Chronic hypoxic respiratory failure on 2-3 L of oxygen at baseline: Stable on 2-3 L of oxygen this morning.  Lying flat.  Not in any respiratory distress.  PAD: Continue Plavix  Hypokalemia: Repleted.  Hypernatremia: Stabilized-improving-continue free water via PEG tube and half-normal saline.   Hypothyroidism: Continue Synthroid-TSH minimally elevated-stable for PCP to repeat TSH in 4 to 6 weeks.  Macrocytic anemia: Chronic issue-likely secondary to CKD-suspect anemia slightly worse due to IVF dilution.  B12/folate level stable.  Chronic bilateral lower extremity pressure ulcers: Present prior to [facility] care consult.  No indication of infection at this point.  Renal cyst/mass upper pole of right kidney: Stable for outpatient follow-up.  Incidental finding.  Severe protein calorie malnutrition: On PEG tube feeds.  Severe failure to thrive syndrome: Bed  bound-dependent on family for all activities of daily living.  Palliative care: Followed by hospice-however remains a full code.  Long discussion with son/daughter-with hospice RN from [facility] in the room-they realized that he is not a candidate for aggressive care-but at this time want to continue full CODE STATUS.  BMI: Estimated body mass index is 22.79 kg/m as calculated from the following:   Height as of this encounter: '5\' 8"'$  (1.727 m).   Weight as of this encounter: 68 kg.   Code status:   Code Status: Full Code   DVT Prophylaxis: SCDs Start: 10/14/21 0801   Family Communication: Son at bedside.  Disposition Plan: Status is: Inpatient Remains inpatient appropriate because: AKI-not yet stable for discharge.   Planned Discharge Destination:Home in the next 24 to 48 hours.   Diet: Diet Order             Diet NPO time specified  Diet effective now                     Antimicrobial agents: Anti-infectives (From admission, onward)    None        MEDICATIONS: Scheduled Meds:  atorvastatin  40 mg Per Tube Daily   clopidogrel  75 mg Per Tube Daily   docusate  100 mg Per Tube Daily   famotidine  20  mg Per Tube QHS   feeding supplement (NEPRO CARB STEADY)  315 mL Per Tube TID BM   ferrous sulfate  300 mg Per Tube Daily   free water  300 mL Per Tube Q8H   ipratropium-albuterol  3 mL Nebulization BID   levothyroxine  75 mcg Per Tube Q0600   mouth rinse  15 mL Mouth Rinse 4 times per day   polyethylene glycol  17 g Per Tube Daily   traMADol  50 mg Per Tube QHS   Continuous Infusions:   PRN Meds:.haloperidol **OR** haloperidol lactate, ipratropium-albuterol, mouth rinse   I have personally reviewed following labs and imaging studies  LABORATORY DATA: CBC: Recent Labs  Lab 10/14/21 0315 10/15/21 0349 10/16/21 0317  WBC 7.7 8.5 10.4  NEUTROABS 6.1  --   --   HGB 7.4* 7.6* 7.5*  HCT 23.7* 24.7* 24.4*  MCV 103.0* 102.9* 104.3*  PLT 102* 119*  117*     Basic Metabolic Panel: Recent Labs  Lab 10/14/21 0824 10/14/21 1152 10/15/21 0349 10/16/21 0317 10/17/21 0333  NA 146* 147* 146* 153* 151*  K 3.3* 3.5 3.3* 3.9 3.9  CL 107 106 107 113* 116*  CO2 '26 27 25 27 26  '$ GLUCOSE 106* 115* 114* 122* 113*  BUN 162* 159* 158* 145* 132*  CREATININE 2.52* 2.53* 2.43* 2.21* 2.07*  CALCIUM 9.4 9.7 9.7 9.8 9.4     GFR: Estimated Creatinine Clearance: 18.7 mL/min (A) (by C-G formula based on SCr of 2.07 mg/dL (H)).  Liver Function Tests: Recent Labs  Lab 10/14/21 0242  AST 36  ALT 25  ALKPHOS 88  BILITOT 0.8  PROT 7.9  ALBUMIN 3.8    No results for input(s): "LIPASE", "AMYLASE" in the last 168 hours. Recent Labs  Lab 10/15/21 0354  AMMONIA 47*     Coagulation Profile: No results for input(s): "INR", "PROTIME" in the last 168 hours.  Cardiac Enzymes: No results for input(s): "CKTOTAL", "CKMB", "CKMBINDEX", "TROPONINI" in the last 168 hours.  BNP (last 3 results) No results for input(s): "PROBNP" in the last 8760 hours.  Lipid Profile: No results for input(s): "CHOL", "HDL", "LDLCALC", "TRIG", "CHOLHDL", "LDLDIRECT" in the last 72 hours.  Thyroid Function Tests: No results for input(s): "TSH", "T4TOTAL", "FREET4", "T3FREE", "THYROIDAB" in the last 72 hours.   Anemia Panel: No results for input(s): "VITAMINB12", "FOLATE", "FERRITIN", "TIBC", "IRON", "RETICCTPCT" in the last 72 hours.   Urine analysis:    Component Value Date/Time   COLORURINE YELLOW 10/14/2021 0242   APPEARANCEUR HAZY (A) 10/14/2021 0242   LABSPEC 1.012 10/14/2021 0242   PHURINE 5.0 10/14/2021 0242   GLUCOSEU NEGATIVE 10/14/2021 0242   HGBUR NEGATIVE 10/14/2021 0242   BILIRUBINUR NEGATIVE 10/14/2021 0242   KETONESUR NEGATIVE 10/14/2021 0242   PROTEINUR NEGATIVE 10/14/2021 0242   NITRITE NEGATIVE 10/14/2021 0242   LEUKOCYTESUR NEGATIVE 10/14/2021 0242    Sepsis Labs: Lactic Acid, Venous    Component Value Date/Time    LATICACIDVEN 0.9 10/14/2021 0528    MICROBIOLOGY: Recent Results (from the past 240 hour(s))  Blood culture (routine x 2)     Status: None (Preliminary result)   Collection Time: 10/14/21  2:42 AM   Specimen: BLOOD RIGHT FOREARM  Result Value Ref Range Status   Specimen Description BLOOD RIGHT FOREARM  Final   Special Requests   Final    BOTTLES DRAWN AEROBIC AND ANAEROBIC Blood Culture adequate volume   Culture   Final    NO GROWTH 3 DAYS Performed at [facility]  [facility], [place] [address] [place], [place] [address]    Report Status PENDING  Incomplete  Urine Culture     Status: Abnormal   Collection Time: 10/14/21  2:42 AM   Specimen: Urine, Clean Catch  Result Value Ref Range Status   Specimen Description URINE, CLEAN CATCH  Final   Special Requests   Final    NONE Performed at [facility], [place] [address], [place], [place] [address]    Culture MULTIPLE SPECIES PRESENT, SUGGEST RECOLLECTION (A)  Final   Report Status 10/15/2021 FINAL  Final  Resp Panel by RT-PCR (Flu A&B, Covid)     Status: None   Collection Time: 10/14/21  2:42 AM   Specimen: Nasal Swab  Result Value Ref Range Status   SARS Coronavirus 2 by RT PCR NEGATIVE NEGATIVE Final    Comment: (NOTE) SARS-CoV-2 target nucleic acids are NOT DETECTED.  The SARS-CoV-2 RNA is generally detectable in upper respiratory specimens during the acute phase of infection. The lowest concentration of SARS-CoV-2 viral copies this assay can detect is 138 copies/mL. A negative result does not preclude SARS-Cov-2 infection and should not be used as the sole basis for treatment or other patient management decisions. A negative result may occur with  improper specimen collection/handling, submission of specimen other than nasopharyngeal swab, presence of viral mutation(s) within the areas targeted by this assay, and inadequate number of viral copies(<138 copies/mL). A negative result must be combined with clinical observations,  patient history, and epidemiological information. The expected result is Negative.  Fact Sheet for Patients:  EntrepreneurPulse.com.au  Fact Sheet for Healthcare Providers:  IncredibleEmployment.be  This test is no t yet approved or cleared by the [place] FDA and  has been authorized for detection and/or diagnosis of SARS-CoV-2 by FDA under an Emergency Use Authorization (EUA). This EUA will remain  in effect (meaning this test can be used) for the duration of the COVID-19 declaration under Section 564(b)(1) of the Act, 21 U.S.C.section 360bbb-3(b)(1), unless the authorization is terminated  or revoked sooner.       Influenza A by PCR NEGATIVE NEGATIVE Final   Influenza B by PCR NEGATIVE NEGATIVE Final    Comment: (NOTE) The Xpert Xpress SARS-CoV-2/FLU/RSV plus assay is intended as an aid in the diagnosis of influenza from Nasopharyngeal swab specimens and should not be used as a sole basis for treatment. Nasal washings and aspirates are unacceptable for Xpert Xpress SARS-CoV-2/FLU/RSV testing.  Fact Sheet for Patients: EntrepreneurPulse.com.au  Fact Sheet for Healthcare Providers: IncredibleEmployment.be  This test is not yet approved or cleared by the [place] FDA and has been authorized for detection and/or diagnosis of SARS-CoV-2 by FDA under an Emergency Use Authorization (EUA). This EUA will remain in effect (meaning this test can be used) for the duration of the COVID-19 declaration under Section 564(b)(1) of the Act, 21 U.S.C. section 360bbb-3(b)(1), unless the authorization is terminated or revoked.  Performed at [facility], [place] [address], [place], [place] [address]   Blood culture (routine x 2)     Status: None (Preliminary result)   Collection Time: 10/14/21  8:31 AM   Specimen: BLOOD LEFT ARM  Result Value Ref Range Status   Specimen Description BLOOD LEFT ARM  Final    Special Requests   Final    BOTTLES DRAWN AEROBIC AND ANAEROBIC Blood Culture adequate volume   Culture   Final    NO GROWTH 3 DAYS Performed at [facility],  [address], [place], [place] [address]    Report Status PENDING  Incomplete    RADIOLOGY STUDIES/RESULTS: No results found.   LOS: 2 days   Oren Binet, MD  [facility]    To contact the attending provider between 7A-7P or the covering provider during after hours 7P-7A, please log into the web site [email or web address] and access using universal [place] password for that web site. If you do not have the password, please call the hospital operator.  10/17/2021, 1:58 PM

## 2021-10-17 NOTE — Plan of Care (Signed)
  Problem: Clinical Measurements: Goal: Will remain free from infection Outcome: Progressing   Problem: Clinical Measurements: Goal: Diagnostic test results will improve Outcome: Progressing   Problem: Clinical Measurements: Goal: Respiratory complications will improve Outcome: Progressing   Problem: Clinical Measurements: Goal: Cardiovascular complication will be avoided Outcome: Progressing

## 2021-10-18 DIAGNOSIS — J9611 Chronic respiratory failure with hypoxia: Secondary | ICD-10-CM | POA: Diagnosis not present

## 2021-10-18 DIAGNOSIS — D649 Anemia, unspecified: Secondary | ICD-10-CM | POA: Diagnosis not present

## 2021-10-18 DIAGNOSIS — R1312 Dysphagia, oropharyngeal phase: Secondary | ICD-10-CM | POA: Diagnosis not present

## 2021-10-18 DIAGNOSIS — N179 Acute kidney failure, unspecified: Secondary | ICD-10-CM | POA: Diagnosis not present

## 2021-10-18 LAB — BASIC METABOLIC PANEL
Anion gap: 7 (ref 5–15)
BUN: 118 mg/dL — ABNORMAL HIGH (ref 8–23)
CO2: 27 mmol/L (ref 22–32)
Calcium: 9.7 mg/dL (ref 8.9–10.3)
Chloride: 119 mmol/L — ABNORMAL HIGH (ref 98–111)
Creatinine, Ser: 1.92 mg/dL — ABNORMAL HIGH (ref 0.61–1.24)
GFR, Estimated: 31 mL/min — ABNORMAL LOW (ref >60)
Glucose, Bld: 125 mg/dL — ABNORMAL HIGH (ref 70–99)
Potassium: 4 mmol/L (ref 3.5–5.1)
Sodium: 153 mmol/L — ABNORMAL HIGH (ref 135–145)

## 2021-10-18 LAB — GLUCOSE, CAPILLARY
Glucose-Capillary: 106 mg/dL — ABNORMAL HIGH (ref 70–99)
Glucose-Capillary: 121 mg/dL — ABNORMAL HIGH (ref 70–99)
Glucose-Capillary: 122 mg/dL — ABNORMAL HIGH (ref 70–99)
Glucose-Capillary: 122 mg/dL — ABNORMAL HIGH (ref 70–99)
Glucose-Capillary: 123 mg/dL — ABNORMAL HIGH (ref 70–99)
Glucose-Capillary: 124 mg/dL — ABNORMAL HIGH (ref 70–99)

## 2021-10-18 MED ORDER — DEXTROSE 5 % IV SOLN: INTRAVENOUS | Status: DC

## 2021-10-18 NOTE — Consult Note (Signed)
Reason for Consult:Left elbow mass Referring Physician: Oren Binet Time called: 1119 Time at bedside: Gregory [name]   Gregory [name] is an 88 y.o. male.  HPI: Gregory [name] was admitted 4d ago with generalized weakness. His caregiver noticed some left elbow swelling about 2d ago. This has been asymptomatic. No obvious antecedent event. [place] was obtained that showed an olecranon bursitis and orthopedic surgery was consulted. The patient states it doesn't bother him.  Past Medical History:  Diagnosis Date   Atrial flutter [facility])    s/p ablation   Cancer [facility])    skin cancer   Cataracts, bilateral    Hypertension    Neuromuscular disorder ([place])    Pacemaker 2010   Thyroid disease     Past Surgical History:  Procedure Laterality Date   APPENDECTOMY     COLONOSCOPY     EYE SURGERY Bilateral    cataract surgery   HERNIA REPAIR Right    INGUINAL HERNIA REPAIR Left 10/12/2013   Procedure: LEFT  INGUINAL HERNIA REPAIR;  Surgeon: Odis Hollingshead, MD;  Location: [facility];  Service: General;  Laterality: Left;   INSERTION OF MESH Left 10/12/2013   Procedure: INSERTION OF MESH;  Surgeon: Odis Hollingshead, MD;  Location: [facility] OR;  Service: General;  Laterality: Left;   PEG TUBE PLACEMENT Left     Family History  Problem Relation Age of Onset   Heart disease Mother    Cancer Father        leukemia    Social History:  reports that he has never smoked. He has never used smokeless tobacco. He reports that he does not drink alcohol and does not use drugs.  Allergies:  Allergies  Allergen Reactions   Cefepime Rash and Other (See Comments)    Severe myoclonus for cefepime- don't take again-    Amlodipine Besylate Other (See Comments)    edema   Penicillins Hives and Rash    Medications: I have reviewed the patient's current medications.  Results for orders placed or performed during the hospital encounter of 10/14/21 (from the past 48 hour(s))  Glucose, capillary     Status: Abnormal    Collection Time: 10/16/21 11:48 AM  Result Value Ref Range   Glucose-Capillary 125 (H) 70 - 99 mg/dL    Comment: Glucose reference range applies only to samples taken after fasting for at least 8 hours.  Glucose, capillary     Status: None   Collection Time: 10/16/21  3:59 PM  Result Value Ref Range   Glucose-Capillary 99 70 - 99 mg/dL    Comment: Glucose reference range applies only to samples taken after fasting for at least 8 hours.  Glucose, capillary     Status: Abnormal   Collection Time: 10/17/21 12:27 AM  Result Value Ref Range   Glucose-Capillary 122 (H) 70 - 99 mg/dL    Comment: Glucose reference range applies only to samples taken after fasting for at least 8 hours.  Basic metabolic panel     Status: Abnormal   Collection Time: 10/17/21  3:33 AM  Result Value Ref Range   Sodium 151 (H) 135 - 145 mmol/L   Potassium 3.9 3.5 - 5.1 mmol/L   Chloride 116 (H) 98 - 111 mmol/L   CO2 26 22 - 32 mmol/L   Glucose, Bld 113 (H) 70 - 99 mg/dL    Comment: Glucose reference range applies only to samples taken after fasting for at least 8 hours.   BUN 132 (H) 8 - 23  mg/dL   Creatinine, Ser 2.07 (H) 0.61 - 1.24 mg/dL   Calcium 9.4 8.9 - 10.3 mg/dL   GFR, Estimated 28 (L) >60 mL/min    Comment: (NOTE) Calculated using the CKD-EPI Creatinine Equation (2021)    Anion gap 9 5 - 15    Comment: Performed at [facility] [address] [place], [place] [address]  Glucose, capillary     Status: Abnormal   Collection Time: 10/17/21  5:15 AM  Result Value Ref Range   Glucose-Capillary 100 (H) 70 - 99 mg/dL    Comment: Glucose reference range applies only to samples taken after fasting for at least 8 hours.  Glucose, capillary     Status: None   Collection Time: 10/17/21  7:55 AM  Result Value Ref Range   Glucose-Capillary 95 70 - 99 mg/dL    Comment: Glucose reference range applies only to samples taken after fasting for at least 8 hours.  Glucose, capillary     Status: None    Collection Time: 10/17/21 11:31 AM  Result Value Ref Range   Glucose-Capillary 92 70 - 99 mg/dL    Comment: Glucose reference range applies only to samples taken after fasting for at least 8 hours.  Glucose, capillary     Status: Abnormal   Collection Time: 10/17/21  6:33 PM  Result Value Ref Range   Glucose-Capillary 114 (H) 70 - 99 mg/dL    Comment: Glucose reference range applies only to samples taken after fasting for at least 8 hours.  Glucose, capillary     Status: Abnormal   Collection Time: 10/17/21  9:35 PM  Result Value Ref Range   Glucose-Capillary 116 (H) 70 - 99 mg/dL    Comment: Glucose reference range applies only to samples taken after fasting for at least 8 hours.  Glucose, capillary     Status: Abnormal   Collection Time: 10/18/21 12:06 AM  Result Value Ref Range   Glucose-Capillary 122 (H) 70 - 99 mg/dL    Comment: Glucose reference range applies only to samples taken after fasting for at least 8 hours.  Basic metabolic panel     Status: Abnormal   Collection Time: 10/18/21  3:27 AM  Result Value Ref Range   Sodium 153 (H) 135 - 145 mmol/L   Potassium 4.0 3.5 - 5.1 mmol/L   Chloride 119 (H) 98 - 111 mmol/L   CO2 27 22 - 32 mmol/L   Glucose, Bld 125 (H) 70 - 99 mg/dL    Comment: Glucose reference range applies only to samples taken after fasting for at least 8 hours.   BUN 118 (H) 8 - 23 mg/dL   Creatinine, Ser 1.92 (H) 0.61 - 1.24 mg/dL   Calcium 9.7 8.9 - 10.3 mg/dL   GFR, Estimated 31 (L) >60 mL/min    Comment: (NOTE) Calculated using the CKD-EPI Creatinine Equation (2021)    Anion gap 7 5 - 15    Comment: Performed at [facility] [address], [place], [place] [address]  Glucose, capillary     Status: Abnormal   Collection Time: 10/18/21  8:16 AM  Result Value Ref Range   Glucose-Capillary 106 (H) 70 - 99 mg/dL    Comment: Glucose reference range applies only to samples taken after fasting for at least 8 hours.    [place] LT UPPER EXTREM LTD SOFT  TISSUE NON VASCULAR  Result Date: 10/17/2021 CLINICAL DATA:  Left arm mass felt by family this morning. Posterior  to elbow. EXAM: ULTRASOUND LEFT UPPER EXTREMITY LIMITED TECHNIQUE: Ultrasound examination of the upper extremity soft tissues was performed in the area of clinical concern. COMPARISON:  None Available. FINDINGS: In the area of interest of the patient's lump at the posterior elbow, there is moderate subcutaneous fat edema and swelling. There is also a complex collection with internal fluid and septations measuring up to approximately 2.7 x 3.6 x 1.2 cm. No significant color flow vascularity. IMPRESSION: In the area of interest of the posterior elbow there is a complex fluid collection. This is compatible with bursitis. Recommend clinical correlation whether this is in the region of the olecranon bursa and may represent "olecranon bursitis." Otherwise, the fluid is nonspecific and may represent a bland noninfected collection versus an abscess. Electronically Signed   By: Yvonne Kendall M.D.   On: 10/17/2021 15:25    Review of Systems  Unable to perform ROS: Other   Blood pressure (!) 117/48, pulse 70, temperature 98.1 F (36.7 C), temperature source Oral, resp. rate (!) 25, height '5\' 8"'$  (1.727 m), weight 68 kg, SpO2 97 %. Physical Exam Constitutional:      General: He is not in acute distress.    Appearance: He is well-developed. He is not diaphoretic.  HENT:     Head: Normocephalic and atraumatic.  Eyes:     General: No scleral icterus.       Right eye: No discharge.        Left eye: No discharge.     Conjunctiva/sclera: Conjunctivae normal.  Cardiovascular:     Rate and Rhythm: Normal rate and regular rhythm.  Pulmonary:     Effort: Pulmonary effort is normal. No respiratory distress.  Musculoskeletal:     Cervical back: Normal range of motion.     Comments: Left shoulder, elbow, wrist, digits- no skin wounds, mildly fluctuant mass post elbow and prox FA, no erythema or warmth,  nontender, no instability, no blocks to motion  Sens  Ax/R/M/U grossly intact  Mot   Ax/ R/ PIN/ M/ AIN/ U grossly intact  Rad 2+  Skin:    General: Skin is warm and dry.  Neurological:     Mental Status: He is alert.  Psychiatric:        Mood and Affect: Mood normal.        Behavior: Behavior normal.     Assessment/Plan: Left olecranon fluid collection -- Given lack of symptoms would advise no further intervention. Pt may use arm without restriction. If the symptomatology changes please let us know. Multiple medical problems including dysphagia-s/p PEG tube placement, chronic hypoxic respiratory failure on 2-3 L of oxygen at all times, benign duodenal mass, chronic HFpEF, CKD stage IIIa, severe aortic stenosis s/p TAVR September 2020, chronic atrial fibrillation (not on anticoagulation due to bleeding risk), CAD being managed medically, and PAD -- per primary service    Lisette Abu, PA-C Orthopedic Surgery [phone] 10/18/2021, 11:43 AM

## 2021-10-18 NOTE — Care Management Important Message (Signed)
Important Message  Patient Details  Name: Gregory [name] MRN: [id number] Date of Birth: May 16, 1920   Medicare Important Message Given:  Yes     [name] 10/18/2021, 2:53 PM

## 2021-10-18 NOTE — Progress Notes (Signed)
PROGRESS NOTE        PATIENT DETAILS Name: Gregory [name] Age: 88 y.o. Sex: male Date of Birth: Apr 11, 1920 Admit Date: 10/14/2021 Admitting Physician Gregory Mutton Kristeen Mans, MD ENI:DPOE, Gregory Haven, DO  Brief Summary: Patient is a 88 y.o.  male with history of dysphagia-s/p PEG tube placement, chronic hypoxic respiratory failure on 2-3 L of oxygen at all times, benign duodenal mass, chronic HFpEF, CKD stage IIIa, severe aortic stenosis s/p TAVR September 2020, chronic atrial fibrillation (not on anticoagulation due to bleeding risk), CAD being managed medically, PAD-who is followed at home by hospice (remains a full code)-brought to the hospital for generalized weakness-family thought his breathing was somewhat labored-upon further evaluation-he was found to have AKI and subsequently admitted to the hospitalist service.  See below for further details.  Significant events: 9/2>> fatigue/generalized weakness-found to have AKI-admit to [facility].  Significant studies: 9/2>> CXR: Atelectasis at left lung base, small bilateral pleural effusions. 9/2>> CT abdomen/pelvis: Well positioned gastrostomy tube-2.3 cm soft tissue projecting into the inferior wall of the third portion of the duodenum.  Possible mass in the upper pole of right kidney.  Moderate right/small left pleural effusion. 9/3>> CT head: No acute intracranial abnormalities.  Significant microbiology data: 9/2>> blood cultures: No growth 9/2>> COVID/influenza PCR: Negative  Procedures: None  Consults: None  Subjective: Some confusion overnight briefly required restraints.  Calm this morning-son at bedside.  Objective: Vitals: Blood pressure (!) 113/52, pulse 70, temperature 97.9 F (36.6 C), temperature source Oral, resp. rate 19, height '5\' 8"'$  (1.727 m), weight 68 kg, SpO2 97 %.   Exam: Gen Exam: Chronically sick appearing-not in any distress. HEENT:atraumatic, normocephalic Chest: B/L clear to  auscultation anteriorly CVS:S1S2 regular Abdomen:soft non tender, non distended Extremities:no edema Neurology: Non focal-but has generalized weakness. Skin: no rash   Pertinent Labs/Radiology:    Latest Ref Rng & Units 10/16/2021    3:17 AM 10/15/2021    3:49 AM 10/14/2021    3:15 AM  CBC  WBC 4.0 - 10.5 K/uL 10.4  8.5  7.7   Hemoglobin 13.0 - 17.0 g/dL 7.5  7.6  7.4   Hematocrit 39.0 - 52.0 % 24.4  24.7  23.7   Platelets 150 - 400 K/uL 117  119  102     Lab Results  Component Value Date   NA 153 (H) 10/18/2021   K 4.0 10/18/2021   CL 119 (H) 10/18/2021   CO2 27 10/18/2021     Assessment/Plan: AKI on CKD stage IIIa: Suspect hemodynamically mediated due to use of furosemide/Bactrim.  Continues to slowly improved-continue supportive care.  Family aware that patient would be a very poor candidate for HD-but thankfully no acute indications for HD at this point.  Right sided pleural effusion: This is a chronic issue-likely a transudate in the setting of known HFpEF.  He has no symptoms from this effusion.  Do not think a thoracocentesis will provide any clinical benefit.  Risks currently outweigh any benefits.  After discussion with son/daughter at bedside on 9/3-plan thoracocentesis was canceled.  Acute metabolic encephalopathy: Improved-likely due to AKI.  At risk for delirium during this hospitalization-Per family he tends to get confused at home at night as well.  Maintain delirium precautions.   Known duodenal mass: Per review of prior records in epic-this has been biopsied in [facility] is benign.  Review of  CT scan done on 9/2-does not show any gastric distention (no history of vomiting as well)-and contrast seems to have passed to his small intestine.  No signs of gastric outlet obstruction.  Son reports that feeding via gravity is slow-I suspect that may be from age related gastroparesis.  Son/daughter aware that no plans for any endoscopic evaluation/surgical  evaluation-since this is asymptomatic at this point-he would not be a candidate for surgical resection in any event.  Left forearm soft tissue density-likely olecranon bursa fluid collection: Asymptomatic-family noted this swelling on 9/5.  Appreciate orthopedics input-no intervention advised.  Chronic HFpEF: Remains euvolemic-continue to watch closely while he is on IVF.    Permanent atrial fibrillation: Continue telemetry monitoring-review of outpatient cardiology note-indicates that patient is not a anticoagulation candidate due to bleeding risk.  On Plavix.    History of complete heart block-s/p PPM placement: Continue telemetry monitoring  Dysphagia: Has been n.p.o. since January 2023-dependent on PEG tube feedings.  Chronic hypoxic respiratory failure on 2-3 L of oxygen at baseline: Stable on 2-3 L of oxygen this morning.  Lying flat.  Not in any respiratory distress.  PAD: Continue Plavix  Hypokalemia: Repleted.  Hypernatremia: Continues to have mild hypernatremia-change to D5 W-encourage free water via PEG tube.  Hypothyroidism: Continue Synthroid-TSH minimally elevated-stable for PCP to repeat TSH in 4 to 6 weeks.  Macrocytic anemia: Chronic issue-likely secondary to CKD-suspect anemia slightly worse due to IVF dilution.  B12/folate level stable.  Chronic bilateral lower extremity pressure ulcers: Present prior to [facility] care consult.  No indication of infection at this point.  Renal cyst/mass upper pole of right kidney: Stable for outpatient follow-up.  Incidental finding.  Severe protein calorie malnutrition: On PEG tube feeds.  Severe failure to thrive syndrome: Bed bound-dependent on family for all activities of daily living.  Palliative care: Followed by hospice-however remains a full code.  Long discussion with son/daughter-with hospice RN from [facility] in the room-they realized that he is not a candidate for aggressive care-but at this time want to continue  full CODE STATUS.  BMI: Estimated body mass index is 22.79 kg/m as calculated from the following:   Height as of this encounter: '5\' 8"'$  (1.727 m).   Weight as of this encounter: 68 kg.   Code status:   Code Status: Full Code   DVT Prophylaxis: heparin injection 5,000 Units Start: 10/17/21 1500 SCDs Start: 10/14/21 0801   Family Communication: Son at bedside.  Disposition Plan: Status is: Inpatient Remains inpatient appropriate because: AKI-not yet stable for discharge.   Planned Discharge Destination:Home likely tomorrow.   Diet: Diet Order             Diet NPO time specified  Diet effective now                     Antimicrobial agents: Anti-infectives (From admission, onward)    None        MEDICATIONS: Scheduled Meds:  atorvastatin  40 mg Per Tube Daily   clopidogrel  75 mg Per Tube Daily   docusate  100 mg Per Tube Daily   famotidine  20 mg Per Tube QHS   feeding supplement (NEPRO CARB STEADY)  315 mL Per Tube TID BM   ferrous sulfate  300 mg Per Tube Daily   free water  300 mL Per Tube Q8H   heparin injection (subcutaneous)  5,000 Units Subcutaneous Q8H   ipratropium-albuterol  3 mL Nebulization BID   levothyroxine  75 mcg  Per Tube Q0600   mouth rinse  15 mL Mouth Rinse 4 times per day   polyethylene glycol  17 g Per Tube Daily   traMADol  50 mg Per Tube QHS   Continuous Infusions:  dextrose 50 mL/hr at 10/18/21 1248    PRN Meds:.haloperidol **OR** haloperidol lactate, ipratropium-albuterol, mouth rinse   I have personally reviewed following labs and imaging studies  LABORATORY DATA: CBC: Recent Labs  Lab 10/14/21 0315 10/15/21 0349 10/16/21 0317  WBC 7.7 8.5 10.4  NEUTROABS 6.1  --   --   HGB 7.4* 7.6* 7.5*  HCT 23.7* 24.7* 24.4*  MCV 103.0* 102.9* 104.3*  PLT 102* 119* 117*     Basic Metabolic Panel: Recent Labs  Lab 10/14/21 1152 10/15/21 0349 10/16/21 0317 10/17/21 0333 10/18/21 0327  NA 147* 146* 153* 151* 153*   K 3.5 3.3* 3.9 3.9 4.0  CL 106 107 113* 116* 119*  CO2 '27 25 27 26 27  '$ GLUCOSE 115* 114* 122* 113* 125*  BUN 159* 158* 145* 132* 118*  CREATININE 2.53* 2.43* 2.21* 2.07* 1.92*  CALCIUM 9.7 9.7 9.8 9.4 9.7     GFR: Estimated Creatinine Clearance: 20.2 mL/min (A) (by C-G formula based on SCr of 1.92 mg/dL (H)).  Liver Function Tests: Recent Labs  Lab 10/14/21 0242  AST 36  ALT 25  ALKPHOS 88  BILITOT 0.8  PROT 7.9  ALBUMIN 3.8    No results for input(s): "LIPASE", "AMYLASE" in the last 168 hours. Recent Labs  Lab 10/15/21 0354  AMMONIA 47*     Coagulation Profile: No results for input(s): "INR", "PROTIME" in the last 168 hours.  Cardiac Enzymes: No results for input(s): "CKTOTAL", "CKMB", "CKMBINDEX", "TROPONINI" in the last 168 hours.  BNP (last 3 results) No results for input(s): "PROBNP" in the last 8760 hours.  Lipid Profile: No results for input(s): "CHOL", "HDL", "LDLCALC", "TRIG", "CHOLHDL", "LDLDIRECT" in the last 72 hours.  Thyroid Function Tests: No results for input(s): "TSH", "T4TOTAL", "FREET4", "T3FREE", "THYROIDAB" in the last 72 hours.   Anemia Panel: No results for input(s): "VITAMINB12", "FOLATE", "FERRITIN", "TIBC", "IRON", "RETICCTPCT" in the last 72 hours.   Urine analysis:    Component Value Date/Time   COLORURINE YELLOW 10/14/2021 0242   APPEARANCEUR HAZY (A) 10/14/2021 0242   LABSPEC 1.012 10/14/2021 0242   PHURINE 5.0 10/14/2021 0242   GLUCOSEU NEGATIVE 10/14/2021 0242   HGBUR NEGATIVE 10/14/2021 0242   BILIRUBINUR NEGATIVE 10/14/2021 0242   KETONESUR NEGATIVE 10/14/2021 0242   PROTEINUR NEGATIVE 10/14/2021 0242   NITRITE NEGATIVE 10/14/2021 0242   LEUKOCYTESUR NEGATIVE 10/14/2021 0242    Sepsis Labs: Lactic Acid, Venous    Component Value Date/Time   LATICACIDVEN 0.9 10/14/2021 0528    MICROBIOLOGY: Recent Results (from the past 240 hour(s))  Blood culture (routine x 2)     Status: None (Preliminary result)    Collection Time: 10/14/21  2:42 AM   Specimen: BLOOD RIGHT FOREARM  Result Value Ref Range Status   Specimen Description BLOOD RIGHT FOREARM  Final   Special Requests   Final    BOTTLES DRAWN AEROBIC AND ANAEROBIC Blood Culture adequate volume   Culture   Final    NO GROWTH 4 DAYS Performed at [facility], [place] [address] [place], [place] [address]    Report Status PENDING  Incomplete  Urine Culture     Status: Abnormal   Collection Time: 10/14/21  2:42 AM   Specimen: Urine, Clean Catch  Result Value  Ref Range Status   Specimen Description URINE, CLEAN CATCH  Final   Special Requests   Final    NONE Performed at [facility], [place] [address] [place], [place] [address]    Culture MULTIPLE SPECIES PRESENT, SUGGEST RECOLLECTION (A)  Final   Report Status 10/15/2021 FINAL  Final  Resp Panel by RT-PCR (Flu A&B, Covid)     Status: None   Collection Time: 10/14/21  2:42 AM   Specimen: Nasal Swab  Result Value Ref Range Status   SARS Coronavirus 2 by RT PCR NEGATIVE NEGATIVE Final    Comment: (NOTE) SARS-CoV-2 target nucleic acids are NOT DETECTED.  The SARS-CoV-2 RNA is generally detectable in upper respiratory specimens during the acute phase of infection. The lowest concentration of SARS-CoV-2 viral copies this assay can detect is 138 copies/mL. A negative result does not preclude SARS-Cov-2 infection and should not be used as the sole basis for treatment or other patient management decisions. A negative result may occur with  improper specimen collection/handling, submission of specimen other than nasopharyngeal swab, presence of viral mutation(s) within the areas targeted by this assay, and inadequate number of viral copies(<138 copies/mL). A negative result must be combined with clinical observations, patient history, and epidemiological information. The expected result is Negative.  Fact Sheet for Patients:  EntrepreneurPulse.com.au  Fact  Sheet for Healthcare Providers:  IncredibleEmployment.be  This test is no t yet approved or cleared by the [place] FDA and  has been authorized for detection and/or diagnosis of SARS-CoV-2 by FDA under an Emergency Use Authorization (EUA). This EUA will remain  in effect (meaning this test can be used) for the duration of the COVID-19 declaration under Section 564(b)(1) of the Act, 21 U.S.C.section 360bbb-3(b)(1), unless the authorization is terminated  or revoked sooner.       Influenza A by PCR NEGATIVE NEGATIVE Final   Influenza B by PCR NEGATIVE NEGATIVE Final    Comment: (NOTE) The Xpert Xpress SARS-CoV-2/FLU/RSV plus assay is intended as an aid in the diagnosis of influenza from Nasopharyngeal swab specimens and should not be used as a sole basis for treatment. Nasal washings and aspirates are unacceptable for Xpert Xpress SARS-CoV-2/FLU/RSV testing.  Fact Sheet for Patients: EntrepreneurPulse.com.au  Fact Sheet for Healthcare Providers: IncredibleEmployment.be  This test is not yet approved or cleared by the [place] FDA and has been authorized for detection and/or diagnosis of SARS-CoV-2 by FDA under an Emergency Use Authorization (EUA). This EUA will remain in effect (meaning this test can be used) for the duration of the COVID-19 declaration under Section 564(b)(1) of the Act, 21 U.S.C. section 360bbb-3(b)(1), unless the authorization is terminated or revoked.  Performed at [facility], [place] [address] [place], [place] [address]   Blood culture (routine x 2)     Status: None (Preliminary result)   Collection Time: 10/14/21  8:31 AM   Specimen: BLOOD LEFT ARM  Result Value Ref Range Status   Specimen Description BLOOD LEFT ARM  Final   Special Requests   Final    BOTTLES DRAWN AEROBIC AND ANAEROBIC Blood Culture adequate volume   Culture   Final    NO GROWTH 4 DAYS Performed at [facility], [place] [address], [place],  [address]    Report Status PENDING  Incomplete    RADIOLOGY STUDIES/RESULTS: [place] LT UPPER EXTREM LTD SOFT TISSUE NON VASCULAR  Result Date: 10/17/2021 CLINICAL DATA:  Left arm mass felt by family this morning.  Posterior to elbow. EXAM: ULTRASOUND LEFT UPPER EXTREMITY LIMITED TECHNIQUE: Ultrasound examination of the upper extremity soft tissues was performed in the area of clinical concern. COMPARISON:  None Available. FINDINGS: In the area of interest of the patient's lump at the posterior elbow, there is moderate subcutaneous fat edema and swelling. There is also a complex collection with internal fluid and septations measuring up to approximately 2.7 x 3.6 x 1.2 cm. No significant color flow vascularity. IMPRESSION: In the area of interest of the posterior elbow there is a complex fluid collection. This is compatible with bursitis. Recommend clinical correlation whether this is in the region of the olecranon bursa and may represent "olecranon bursitis." Otherwise, the fluid is nonspecific and may represent a bland noninfected collection versus an abscess. Electronically Signed   By: Yvonne Kendall M.D.   On: 10/17/2021 15:25     LOS: 3 days   Oren Binet, MD  [facility]    To contact the attending provider between 7A-7P or the covering provider during after hours 7P-7A, please log into the web site [email or web address] and access using universal [place] password for that web site. If you do not have the password, please call the hospital operator.  10/18/2021, 2:06 PM

## 2021-10-18 NOTE — Progress Notes (Signed)
Gregory [name]) Hospital liaison note   [name] is a current [facility] [name] hospice patient with a terminal diagnosis of Congestive Heart Failure. On Friday, 9.1.2023, patient complained of feeling hot and "tired and bad all over". Hospice RN made on-call visit, son reported patient had received 2 feedings through his G-tube, "but nothing else had seemed to fit". RN noted distended abdomen and hypoactive bowel sounds in all 4 quadrants. Last reported BM 8.30. Enema recommended and to continue Miralax and Colace. Son decided to wait to administer enema until he had help the following day. Early morning of 9.2.2023, patient requested to go to [facility] as "he felt bad". Son called and notified hospice, hospice on-call informed son we could not request transport to a certain hospital; son stated he would discuss with his father. Hospice was notified 9.2.2023 of hospital admission with a diagnosis of Acute Kidney Injury and anemia. Per Dr. Hollace Kinnier with [facility], this is a related hospital admission.   Visited patient and daughter, Gregory [name] and son, Gregory [name] present at the bedside. Patient was lying in bed, alert and conversant with some confusion. Had some agitation reported earlier in the day, but appeared comfortable during my visit. Report exchanged with hospital staff and TOC. Discharge is anticipated for tomorrow.    Gregory [name] is inpatient appropriate for treatment of AKI, requiring IV hydration.    V/S: 98.4, 94/84, HR 70, RR 20, SPO2 98% on 3 lpm via [place]  I&O: Intake not recorded/700  Labs:  BASIC METABOLIC PANEL: Sodium: 614 (H) Chloride: 119 (H) Glucose: 125 (H) BUN: 118 (H) Creatinine: 1.92 (H) GFR, Estimated: 31 (L)  IV/PRN:  dextrose 5 % solution - 6m/hr, continuous, IV  haloperidol lactate (HALDOL) injection 5 mg  -q8hrs PRN, IM x1  Diagnostics: none today    Problem List: Assessment/Plan: AKI on CKD stage IIIa: Suspect hemodynamically mediated due to use of  furosemide/Bactrim.  Continues to slowly improved-continue supportive care.  Family aware that patient would be a very poor candidate for HD-but thankfully no acute indications for HD at this point.   Right sided pleural effusion: This is a chronic issue-likely a transudate in the setting of known HFpEF.  He has no symptoms from this effusion.  Do not think a thoracocentesis will provide any clinical benefit.  Risks currently outweigh any benefits.  After discussion with son/daughter at bedside on 9/3-plan thoracocentesis was canceled.   Acute metabolic encephalopathy: Improved-likely due to AKI.  At risk for delirium during this hospitalization-Per family he tends to get confused at home at night as well.  Maintain delirium precautions.    Known duodenal mass: Per review of prior records in epic-[facility]  Review of CT scan done on 9/2-does not show any gastric distention (no history of vomiting as well)-and contrast seems to have passed to his small intestine.  No signs of gastric outlet obstruction.  Son reports that feeding via gravity is slow-I suspect that may be from age related gastroparesis.  Son/daughter aware that no plans for any endoscopic evaluation/surgical evaluation-since this is asymptomatic at this point-he would not be a candidate for surgical resection in any event.   Left forearm soft tissue density-likely olecranon bursa fluid collection: Asymptomatic-family noted this swelling on 9/5.  Appreciate orthopedics input-no intervention advised.   Chronic HFpEF: Remains euvolemic-continue to watch closely while he is on IVF.     Permanent atrial fibrillation: Continue telemetry monitoring-review of outpatient cardiology note-indicates that  patient is not a anticoagulation candidate due to bleeding risk.  On Plavix.     History of complete heart block-s/p PPM placement: Continue telemetry monitoring   Dysphagia: Has been n.p.o. since  January 2023-dependent on PEG tube feedings.   Chronic hypoxic respiratory failure on 2-3 L of oxygen at baseline: Stable on 2-3 L of oxygen this morning.  Lying flat.  Not in any respiratory distress.   PAD: Continue Plavix   Hypokalemia: Repleted.   Hypernatremia: Continues to have mild hypernatremia-change to D5 W-encourage free water via PEG tube.   Hypothyroidism: Continue Synthroid-TSH minimally elevated-stable for PCP to repeat TSH in 4 to 6 weeks.   Macrocytic anemia: Chronic issue-likely secondary to CKD-suspect anemia slightly worse due to IVF dilution.  B12/folate level stable.   Chronic bilateral lower extremity pressure ulcers: Present prior to [name] care consult.  No indication of infection at this point.   Renal cyst/mass upper pole of right kidney: Stable for outpatient follow-up.  Incidental finding.   Severe protein calorie malnutrition: On PEG tube feeds.   Severe failure to thrive syndrome: Bed bound-dependent on family for all activities of daily living.   GOC: Ongoing. He is a full code and family is not interested in discussing further at this time.  D/C planning: Will return home with hospice support possibly tomorrow.   Family: Spoke to daughter and son present at bedside.   IDT: Hospice team updated.   Transfer summary and med list to shadow chart.    Once ready for discharge, please use [facility] for ambulance transport as they contract this service for our active hospice patients.  [facility] liaison  [phone]

## 2021-10-18 NOTE — TOC Progression Note (Addendum)
Transition of Care [facility]) - Progression Note    Patient Details  Name: Gregory [name] MRN: [id number] Date of Birth: 12/04/1920  Transition of Care [facility]) CM/SW [place], RN Phone Number: 10/18/2021, 2:30 PM  Clinical Narrative:      Left VM with son, [name], regarding transition needs. Awaiting return call.  [place] to [place], daughter, to notify her of possible discharge tomorrow. [name] requests the [facility] to reach out to [employer].    This RNCM has also messaged Nicholaus Corolla with [facility] to notify her of possible dc tomorrow.  Expected Discharge Plan and [place] with [facility] hospice                     Social Determinants of Health (SDOH) Interventions    Readmission Risk Interventions    08/16/2021   11:52 AM 03/10/2021    3:21 PM  Readmission Risk Prevention Plan  Transportation Screening Complete Complete  PCP or Specialist Appt within 5-7 Days  Complete  Home Care Screening  Complete  Medication Review (RN CM)  Complete  Medication Review (RN Care Manager) Complete   HRI or Home Care Consult Complete   SW Recovery Care/Counseling Consult Complete   Palliative Care Screening Complete   [place] Not Applicable

## 2021-10-19 DIAGNOSIS — N179 Acute kidney failure, unspecified: Secondary | ICD-10-CM | POA: Diagnosis not present

## 2021-10-19 DIAGNOSIS — R1312 Dysphagia, oropharyngeal phase: Secondary | ICD-10-CM | POA: Diagnosis not present

## 2021-10-19 DIAGNOSIS — J9611 Chronic respiratory failure with hypoxia: Secondary | ICD-10-CM | POA: Diagnosis not present

## 2021-10-19 DIAGNOSIS — D649 Anemia, unspecified: Secondary | ICD-10-CM | POA: Diagnosis not present

## 2021-10-19 LAB — CULTURE, BLOOD (ROUTINE X 2)
Culture: NO GROWTH
Culture: NO GROWTH
Special Requests: ADEQUATE
Special Requests: ADEQUATE

## 2021-10-19 LAB — GLUCOSE, CAPILLARY
Glucose-Capillary: 103 mg/dL — ABNORMAL HIGH (ref 70–99)
Glucose-Capillary: 116 mg/dL — ABNORMAL HIGH (ref 70–99)
Glucose-Capillary: 120 mg/dL — ABNORMAL HIGH (ref 70–99)
Glucose-Capillary: 126 mg/dL — ABNORMAL HIGH (ref 70–99)
Glucose-Capillary: 130 mg/dL — ABNORMAL HIGH (ref 70–99)
Glucose-Capillary: 136 mg/dL — ABNORMAL HIGH (ref 70–99)
Glucose-Capillary: 138 mg/dL — ABNORMAL HIGH (ref 70–99)

## 2021-10-19 LAB — CBC
HCT: 25 % — ABNORMAL LOW (ref 39.0–52.0)
Hemoglobin: 7.4 g/dL — ABNORMAL LOW (ref 13.0–17.0)
MCH: 32.6 pg (ref 26.0–34.0)
MCHC: 29.6 g/dL — ABNORMAL LOW (ref 30.0–36.0)
MCV: 110.1 fL — ABNORMAL HIGH (ref 80.0–100.0)
Platelets: 123 10*3/uL — ABNORMAL LOW (ref 150–400)
RBC: 2.27 MIL/uL — ABNORMAL LOW (ref 4.22–5.81)
RDW: 18.4 % — ABNORMAL HIGH (ref 11.5–15.5)
WBC: 10.9 10*3/uL — ABNORMAL HIGH (ref 4.0–10.5)
nRBC: 0 % (ref 0.0–0.2)

## 2021-10-19 LAB — BASIC METABOLIC PANEL
Anion gap: 9 (ref 5–15)
BUN: 100 mg/dL — ABNORMAL HIGH (ref 8–23)
CO2: 29 mmol/L (ref 22–32)
Calcium: 10.7 mg/dL — ABNORMAL HIGH (ref 8.9–10.3)
Chloride: 119 mmol/L — ABNORMAL HIGH (ref 98–111)
Creatinine, Ser: 1.7 mg/dL — ABNORMAL HIGH (ref 0.61–1.24)
GFR, Estimated: 36 mL/min — ABNORMAL LOW (ref >60)
Glucose, Bld: 129 mg/dL — ABNORMAL HIGH (ref 70–99)
Potassium: 4.2 mmol/L (ref 3.5–5.1)
Sodium: 157 mmol/L — ABNORMAL HIGH (ref 135–145)

## 2021-10-19 MED ORDER — DEXTROSE 5 % IV SOLN: INTRAVENOUS | Status: AC

## 2021-10-19 MED ORDER — SALINE SPRAY 0.65 % NA SOLN
1.0000 | NASAL | Status: DC | PRN
  Administered 2021-10-19: 1 via NASAL
  Filled 2021-10-19: qty 44

## 2021-10-19 NOTE — Progress Notes (Signed)
PROGRESS NOTE        PATIENT DETAILS Name: Gregory [name] Age: 88 y.o. Sex: male Date of Birth: Sep 22, 1920 Admit Date: 10/14/2021 Admitting Physician Evalee Mutton Kristeen Mans, MD XBD:ZHGD, [name], DO  Brief Summary: Patient is a 88 y.o.  male with history of dysphagia-s/p PEG tube placement, chronic hypoxic respiratory failure on 2-3 L of oxygen at all times, benign duodenal mass, chronic HFpEF, CKD stage IIIa, severe aortic stenosis s/p TAVR September 2020, chronic atrial fibrillation (not on anticoagulation due to bleeding risk), CAD being managed medically, PAD-who is followed at home by hospice (remains a full code)-brought to the hospital for generalized weakness-family thought his breathing was somewhat labored-upon further evaluation-he was found to have AKI and subsequently admitted to the hospitalist service.  See below for further details.  Significant events: 9/2>> fatigue/generalized weakness-found to have AKI-admit to [facility].  Significant studies: 9/2>> CXR: Atelectasis at left lung base, small bilateral pleural effusions. 9/2>> CT abdomen/pelvis: Well positioned gastrostomy tube-2.3 cm soft tissue projecting into the inferior wall of the third portion of the duodenum.  Possible mass in the upper pole of right kidney.  Moderate right/small left pleural effusion. 9/3>> CT head: No acute intracranial abnormalities.  Significant microbiology data: 9/2>> blood cultures: No growth 9/2>> COVID/influenza PCR: Negative  Procedures: None  Consults: None  Subjective: No major issues overnight-some intermittent delirium continues.  Appears very frail and weak.  Son/daughter at bedside this morning.  Objective: Vitals: Blood pressure 95/64, pulse 73, temperature 98 F (36.7 C), temperature source Oral, resp. rate 20, height '5\' 8"'$  (1.727 m), weight 68 kg, SpO2 100 %.   Exam: Gen Exam:not in any distress HEENT:atraumatic, normocephalic Chest: B/L  clear to auscultation anteriorly CVS:S1S2 regular Abdomen:soft non tender, non distended Extremities:no edema Neurology: Non focal-but with severe generalized weakness. Skin: no rash   Pertinent Labs/Radiology:    Latest Ref Rng & Units 10/16/2021    3:17 AM 10/15/2021    3:49 AM 10/14/2021    3:15 AM  CBC  WBC 4.0 - 10.5 K/uL 10.4  8.5  7.7   Hemoglobin 13.0 - 17.0 g/dL 7.5  7.6  7.4   Hematocrit 39.0 - 52.0 % 24.4  24.7  23.7   Platelets 150 - 400 K/uL 117  119  102     Lab Results  Component Value Date   NA 157 (H) 10/19/2021   K 4.2 10/19/2021   CL 119 (H) 10/19/2021   CO2 29 10/19/2021     Assessment/Plan: AKI on CKD stage IIIa: Due to combination of diuretic/Bactrim use.  Improved with supportive care/IV fluid hydration.  Creatinine/BUN almost back to baseline.  Right sided pleural effusion: This is a chronic issue-likely a transudate in the setting of known HFpEF.  He has no symptoms from this effusion.  Do not think a thoracocentesis will provide any clinical benefit.  Risks currently outweigh any benefits.  After discussion with son/daughter at bedside on 9/3-plan thoracocentesis was canceled.  Acute metabolic encephalopathy: Likely due to AKI-improved-continues to have some intermittent delirium-especially at night.  Per family-has a history of delirium at night even at home as well.   Known duodenal mass: Per review of prior records in epic-this has been biopsied in [facility] is benign.  Review of CT scan done on 9/2-does not show any gastric distention (no history of vomiting as well)-and contrast  seems to have passed to his small intestine.  No signs of gastric outlet obstruction.  Son reports that feeding via gravity is slow-I suspect that may be from age related gastroparesis.  Son/daughter aware that no plans for any endoscopic evaluation/surgical evaluation-since this is asymptomatic at this point-he would not be a candidate for surgical resection in any  event.  Left forearm soft tissue density-likely olecranon bursa fluid collection: Asymptomatic-family noted this swelling on 9/5.  Appreciate orthopedics input-no intervention advised.  Chronic HFpEF: Remains euvolemic-continue to watch closely while he is on IVF.    Permanent atrial fibrillation: Continue telemetry monitoring-review of outpatient cardiology note-indicates that patient is not a anticoagulation candidate due to bleeding risk.  On Plavix.    History of complete heart block-s/p PPM placement: Continue telemetry monitoring  Dysphagia: Has been n.p.o. since January 2023-dependent on PEG tube feedings.  Chronic hypoxic respiratory failure on 2-3 L of oxygen at baseline: Stable on 2-3 L of oxygen this morning.  Continues to lie flat-no signs of respiratory distress.    PAD: Continue Plavix  Hypokalemia: Repleted.  Hypernatremia: Unfortunately-continues to have worsening hypernatremia-nursing staff instructed to try and give at free water 300 cc 3 times daily as ordered-increased D5W.  No signs of volume overload but watch closely.    Hypothyroidism: Continue Synthroid-TSH minimally elevated-stable for PCP to repeat TSH in 4 to 6 weeks.  Macrocytic anemia: Chronic issue-likely secondary to CKD-suspect anemia slightly worse due to IVF dilution.  B12/folate level stable.  Chronic bilateral lower extremity pressure ulcers: Present prior to [facility] care consult.  No indication of infection at this point.  Renal cyst/mass upper pole of right kidney: Stable for outpatient follow-up.  Incidental finding.  Severe protein calorie malnutrition: On PEG tube feeds.  Severe failure to thrive syndrome: Bed bound-dependent on family for all activities of daily living.  Palliative care: Followed by hospice-however remains a full code.  Long discussion with son/daughter-with hospice RN from [facility] in the room-they realized that he is not a candidate for aggressive care-but at  this time want to continue full CODE STATUS.  BMI: Estimated body mass index is 22.79 kg/m as calculated from the following:   Height as of this encounter: '5\' 8"'$  (1.727 m).   Weight as of this encounter: 68 kg.   Code status:   Code Status: Full Code   DVT Prophylaxis: heparin injection 5,000 Units Start: 10/17/21 1500 SCDs Start: 10/14/21 0801   Family Communication: Son/daughter at bedside.  Disposition Plan: Status is: Inpatient Remains inpatient appropriate because: Worsening hyponatremia-not yet stable for discharge.   Planned Discharge Destination:Home likely tomorrow.   Diet: Diet Order             Diet NPO time specified  Diet effective now                     Antimicrobial agents: Anti-infectives (From admission, onward)    None        MEDICATIONS: Scheduled Meds:  atorvastatin  40 mg Per Tube Daily   clopidogrel  75 mg Per Tube Daily   docusate  100 mg Per Tube Daily   famotidine  20 mg Per Tube QHS   feeding supplement (NEPRO CARB STEADY)  315 mL Per Tube TID BM   ferrous sulfate  300 mg Per Tube Daily   free water  300 mL Per Tube Q8H   heparin injection (subcutaneous)  5,000 Units Subcutaneous Q8H   levothyroxine  75 mcg Per Tube  Q0600   mouth rinse  15 mL Mouth Rinse 4 times per day   polyethylene glycol  17 g Per Tube Daily   traMADol  50 mg Per Tube QHS   Continuous Infusions:  dextrose 100 mL/hr at 10/19/21 0928    PRN Meds:.haloperidol **OR** haloperidol lactate, ipratropium-albuterol, mouth rinse   I have personally reviewed following labs and imaging studies  LABORATORY DATA: CBC: Recent Labs  Lab 10/14/21 0315 10/15/21 0349 10/16/21 0317  WBC 7.7 8.5 10.4  NEUTROABS 6.1  --   --   HGB 7.4* 7.6* 7.5*  HCT 23.7* 24.7* 24.4*  MCV 103.0* 102.9* 104.3*  PLT 102* 119* 117*     Basic Metabolic Panel: Recent Labs  Lab 10/15/21 0349 10/16/21 0317 10/17/21 0333 10/18/21 0327 10/19/21 0551  NA 146* 153* 151* 153*  157*  K 3.3* 3.9 3.9 4.0 4.2  CL 107 113* 116* 119* 119*  CO2 '25 27 26 27 29  '$ GLUCOSE 114* 122* 113* 125* 129*  BUN 158* 145* 132* 118* 100*  CREATININE 2.43* 2.21* 2.07* 1.92* 1.70*  CALCIUM 9.7 9.8 9.4 9.7 10.7*     GFR: Estimated Creatinine Clearance: 22.8 mL/min (A) (by C-G formula based on SCr of 1.7 mg/dL (H)).  Liver Function Tests: Recent Labs  Lab 10/14/21 0242  AST 36  ALT 25  ALKPHOS 88  BILITOT 0.8  PROT 7.9  ALBUMIN 3.8    No results for input(s): "LIPASE", "AMYLASE" in the last 168 hours. Recent Labs  Lab 10/15/21 0354  AMMONIA 47*     Coagulation Profile: No results for input(s): "INR", "PROTIME" in the last 168 hours.  Cardiac Enzymes: No results for input(s): "CKTOTAL", "CKMB", "CKMBINDEX", "TROPONINI" in the last 168 hours.  BNP (last 3 results) No results for input(s): "PROBNP" in the last 8760 hours.  Lipid Profile: No results for input(s): "CHOL", "HDL", "LDLCALC", "TRIG", "CHOLHDL", "LDLDIRECT" in the last 72 hours.  Thyroid Function Tests: No results for input(s): "TSH", "T4TOTAL", "FREET4", "T3FREE", "THYROIDAB" in the last 72 hours.   Anemia Panel: No results for input(s): "VITAMINB12", "FOLATE", "FERRITIN", "TIBC", "IRON", "RETICCTPCT" in the last 72 hours.   Urine analysis:    Component Value Date/Time   COLORURINE YELLOW 10/14/2021 0242   APPEARANCEUR HAZY (A) 10/14/2021 0242   LABSPEC 1.012 10/14/2021 0242   PHURINE 5.0 10/14/2021 0242   GLUCOSEU NEGATIVE 10/14/2021 0242   HGBUR NEGATIVE 10/14/2021 0242   BILIRUBINUR NEGATIVE 10/14/2021 0242   KETONESUR NEGATIVE 10/14/2021 0242   PROTEINUR NEGATIVE 10/14/2021 0242   NITRITE NEGATIVE 10/14/2021 0242   LEUKOCYTESUR NEGATIVE 10/14/2021 0242    Sepsis Labs: Lactic Acid, Venous    Component Value Date/Time   LATICACIDVEN 0.9 10/14/2021 0528    MICROBIOLOGY: Recent Results (from the past 240 hour(s))  Blood culture (routine x 2)     Status: None   Collection Time:  10/14/21  2:42 AM   Specimen: BLOOD RIGHT FOREARM  Result Value Ref Range Status   Specimen Description BLOOD RIGHT FOREARM  Final   Special Requests   Final    BOTTLES DRAWN AEROBIC AND ANAEROBIC Blood Culture adequate volume   Culture   Final    NO GROWTH 5 DAYS Performed at [facility], [place] [address] [place], [place] [address]    Report Status 10/19/2021 FINAL  Final  Urine Culture     Status: Abnormal   Collection Time: 10/14/21  2:42 AM   Specimen: Urine, Clean Catch  Result Value Ref Range Status  Specimen Description URINE, CLEAN CATCH  Final   Special Requests   Final    NONE Performed at [facility], [place] [address], [place], [place] [address]    Culture MULTIPLE SPECIES PRESENT, SUGGEST RECOLLECTION (A)  Final   Report Status 10/15/2021 FINAL  Final  Resp Panel by RT-PCR (Flu A&B, Covid)     Status: None   Collection Time: 10/14/21  2:42 AM   Specimen: Nasal Swab  Result Value Ref Range Status   SARS Coronavirus 2 by RT PCR NEGATIVE NEGATIVE Final    Comment: (NOTE) SARS-CoV-2 target nucleic acids are NOT DETECTED.  The SARS-CoV-2 RNA is generally detectable in upper respiratory specimens during the acute phase of infection. The lowest concentration of SARS-CoV-2 viral copies this assay can detect is 138 copies/mL. A negative result does not preclude SARS-Cov-2 infection and should not be used as the sole basis for treatment or other patient management decisions. A negative result may occur with  improper specimen collection/handling, submission of specimen other than nasopharyngeal swab, presence of viral mutation(s) within the areas targeted by this assay, and inadequate number of viral copies(<138 copies/mL). A negative result must be combined with clinical observations, patient history, and epidemiological information. The expected result is Negative.  Fact Sheet for Patients:  EntrepreneurPulse.com.au  Fact Sheet for  Healthcare Providers:  IncredibleEmployment.be  This test is no t yet approved or cleared by the [place] FDA and  has been authorized for detection and/or diagnosis of SARS-CoV-2 by FDA under an Emergency Use Authorization (EUA). This EUA will remain  in effect (meaning this test can be used) for the duration of the COVID-19 declaration under Section 564(b)(1) of the Act, 21 U.S.C.section 360bbb-3(b)(1), unless the authorization is terminated  or revoked sooner.       Influenza A by PCR NEGATIVE NEGATIVE Final   Influenza B by PCR NEGATIVE NEGATIVE Final    Comment: (NOTE) The Xpert Xpress SARS-CoV-2/FLU/RSV plus assay is intended as an aid in the diagnosis of influenza from Nasopharyngeal swab specimens and should not be used as a sole basis for treatment. Nasal washings and aspirates are unacceptable for Xpert Xpress SARS-CoV-2/FLU/RSV testing.  Fact Sheet for Patients: EntrepreneurPulse.com.au  Fact Sheet for Healthcare Providers: IncredibleEmployment.be  This test is not yet approved or cleared by the [place] FDA and has been authorized for detection and/or diagnosis of SARS-CoV-2 by FDA under an Emergency Use Authorization (EUA). This EUA will remain in effect (meaning this test can be used) for the duration of the COVID-19 declaration under Section 564(b)(1) of the Act, 21 U.S.C. section 360bbb-3(b)(1), unless the authorization is terminated or revoked.  Performed at [facility], [place] [address] [place], [place] [address]   Blood culture (routine x 2)     Status: None   Collection Time: 10/14/21  8:31 AM   Specimen: BLOOD LEFT ARM  Result Value Ref Range Status   Specimen Description BLOOD LEFT ARM  Final   Special Requests   Final    BOTTLES DRAWN AEROBIC AND ANAEROBIC Blood Culture adequate volume   Culture   Final    NO GROWTH 5 DAYS Performed at [facility], [place] [address],  [place], [place] [address]    Report Status 10/19/2021 FINAL  Final    RADIOLOGY STUDIES/RESULTS: [place] LT UPPER EXTREM LTD SOFT TISSUE NON VASCULAR  Result Date: 10/17/2021 CLINICAL DATA:  Left arm mass felt by family this morning. Posterior to elbow. EXAM: ULTRASOUND LEFT  UPPER EXTREMITY LIMITED TECHNIQUE: Ultrasound examination of the upper extremity soft tissues was performed in the area of clinical concern. COMPARISON:  None Available. FINDINGS: In the area of interest of the patient's lump at the posterior elbow, there is moderate subcutaneous fat edema and swelling. There is also a complex collection with internal fluid and septations measuring up to approximately 2.7 x 3.6 x 1.2 cm. No significant color flow vascularity. IMPRESSION: In the area of interest of the posterior elbow there is a complex fluid collection. This is compatible with bursitis. Recommend clinical correlation whether this is in the region of the olecranon bursa and may represent "olecranon bursitis." Otherwise, the fluid is nonspecific and may represent a bland noninfected collection versus an abscess. Electronically Signed   By: Yvonne Kendall M.D.   On: 10/17/2021 15:25     LOS: 4 days   Oren Binet, MD  [facility]    To contact the attending provider between 7A-7P or the covering provider during after hours 7P-7A, please log into the web site [email or web address] and access using universal [place] password for that web site. If you do not have the password, please call the hospital operator.  10/19/2021, 12:01 PM

## 2021-10-19 NOTE — Plan of Care (Signed)

## 2021-10-19 NOTE — Progress Notes (Signed)
[facility]) Hospital liaison note   Gregory [name] is a current [name] hospice patient with a terminal diagnosis of Congestive Heart Failure. On Friday, 9.1.2023, patient complained of feeling hot and "tired and bad all over". Hospice RN made on-call visit, son reported patient had received 2 feedings through his G-tube, "but nothing else had seemed to fit". RN noted distended abdomen and hypoactive bowel sounds in all 4 quadrants. Last reported BM 8.30. Enema recommended and to continue Miralax and Colace. Son decided to wait to administer enema until he had help the following day. Early morning of 9.2.2023, patient requested to go to [name] as "he felt bad". Son called and notified hospice, hospice on-call informed son we could not request transport to a certain hospital; son stated he would discuss with his father. Hospice was notified 9.2.2023 of hospital admission with a diagnosis of Acute Kidney Injury and anemia. Per Gregory [name] with Gregory [name], this is a related hospital admission.   Visited patient and daughter, Gregory [name] at bedside. Patient was lying in bed, alert with confusion, appeared comfortable. Anticipated discharge is set for tomorrow. Will follow up tomorrow.    Gregory [name] is inpatient appropriate for treatment of AKI, requiring IV hydration.    V/S: 97.8, 107/57, HR 70, RR 16, SPO2 98% on 3 lpm via [place]  I&O: 755.08/1648  Labs:  Sodium: 157 (H) Potassium: 4.2 Chloride: 119 (H) Glucose: 129 (H) BUN: 100 (H) Creatinine: 1.70 (H) Calcium: 10.7 (H) GFR, Estimated: 36 (L) WBC: 10.9 (H) RBC: 2.27 (L) Hemoglobin: 7.4 (L) HCT: 25.0 (L) MCV: 110.1 (H) MCHC: 29.6 (L) RDW: 18.4 (H) Platelets: 123 (L)   IV/PRN:  dextrose 5 % solution -74m/hr, Continuous, IV x 7   Diagnostics: none today    Problem List: Assessment/Plan: AKI on CKD stage IIIa: Due to combination of diuretic/Bactrim use.  Improved with supportive care/IV fluid hydration.  Creatinine/BUN  almost back to baseline.   Right sided pleural effusion: This is a chronic issue-likely a transudate in the setting of known HFpEF.  He has no symptoms from this effusion.  Do not think a thoracocentesis will provide any clinical benefit.  Risks currently outweigh any benefits.  After discussion with son/daughter at bedside on 9/3-plan thoracocentesis was canceled.   Acute metabolic encephalopathy: Likely due to AKI-improved-continues to have some intermittent delirium-especially at night.  Per family-has a history of delirium at night even at home as well.    Known duodenal mass: Per review of prior records in epic-[facility]  Review of CT scan done on 9/2-does not show any gastric distention (no history of vomiting as well)-and contrast seems to have passed to his small intestine.  No signs of gastric outlet obstruction.  Son reports that feeding via gravity is slow-I suspect that may be from age related gastroparesis.  Son/daughter aware that no plans for any endoscopic evaluation/surgical evaluation-since this is asymptomatic at this point-he would not be a candidate for surgical resection in any event.   Left forearm soft tissue density-likely olecranon bursa fluid collection: Asymptomatic-family noted this swelling on 9/5.  Appreciate orthopedics input-no intervention advised.   Chronic HFpEF: Remains euvolemic-continue to watch closely while he is on IVF.     Permanent atrial fibrillation: Continue telemetry monitoring-review of outpatient cardiology note-indicates that patient is not a anticoagulation candidate due to bleeding risk.  On Plavix.     History of complete heart block-s/p PPM placement: Continue telemetry monitoring  Dysphagia: Has been n.p.o. since January 2023-dependent on PEG tube feedings.   Chronic hypoxic respiratory failure on 2-3 L of oxygen at baseline: Stable on 2-3 L of oxygen this morning.  Continues to lie flat-no  signs of respiratory distress.     PAD: Continue Plavix   Hypokalemia: Repleted.   Hypernatremia: Unfortunately-continues to have worsening hypernatremia-nursing staff instructed to try and give at free water 300 cc 3 times daily as ordered-increased D5W.  No signs of volume overload but watch closely.     Hypothyroidism: Continue Synthroid-TSH minimally elevated-stable for PCP to repeat TSH in 4 to 6 weeks.   Macrocytic anemia: Chronic issue-likely secondary to CKD-suspect anemia slightly worse due to IVF dilution.  B12/folate level stable.   Chronic bilateral lower extremity pressure ulcers: Present prior to [name] care consult.  No indication of infection at this point.   Renal cyst/mass upper pole of right kidney: Stable for outpatient follow-up.  Incidental finding.   Severe protein calorie malnutrition: On PEG tube feeds.   Severe failure to thrive syndrome: Bed bound-dependent on family for all activities of daily living.   GOC: Ongoing. He is a full code and the patient and family are not interested in discussing further.  D/C planning: Return home with hospice support. Discharge anticipated tomorrow.   Family: Spoke to daughter at bedside.   IDT: Hospice team updated.   Transfer summary and med list to shadow chart.    Once ready for discharge, please use [facility] for ambulance transport as they contract this service for our active hospice patients.  [place] [name] liaison  [phone]

## 2021-10-20 DIAGNOSIS — E870 Hyperosmolality and hypernatremia: Secondary | ICD-10-CM

## 2021-10-20 DIAGNOSIS — I442 Atrioventricular block, complete: Secondary | ICD-10-CM

## 2021-10-20 DIAGNOSIS — N179 Acute kidney failure, unspecified: Secondary | ICD-10-CM | POA: Diagnosis not present

## 2021-10-20 DIAGNOSIS — J9611 Chronic respiratory failure with hypoxia: Secondary | ICD-10-CM | POA: Diagnosis not present

## 2021-10-20 LAB — BASIC METABOLIC PANEL
Anion gap: 10 (ref 5–15)
BUN: 88 mg/dL — ABNORMAL HIGH (ref 8–23)
CO2: 27 mmol/L (ref 22–32)
Calcium: 10.2 mg/dL (ref 8.9–10.3)
Chloride: 113 mmol/L — ABNORMAL HIGH (ref 98–111)
Creatinine, Ser: 1.54 mg/dL — ABNORMAL HIGH (ref 0.61–1.24)
GFR, Estimated: 40 mL/min — ABNORMAL LOW (ref >60)
Glucose, Bld: 113 mg/dL — ABNORMAL HIGH (ref 70–99)
Potassium: 4.4 mmol/L (ref 3.5–5.1)
Sodium: 150 mmol/L — ABNORMAL HIGH (ref 135–145)

## 2021-10-20 LAB — GLUCOSE, CAPILLARY
Glucose-Capillary: 120 mg/dL — ABNORMAL HIGH (ref 70–99)
Glucose-Capillary: 107 mg/dL — ABNORMAL HIGH (ref 70–99)
Glucose-Capillary: 97 mg/dL (ref 70–99)
Glucose-Capillary: 164 mg/dL — ABNORMAL HIGH (ref 70–99)

## 2021-10-20 MED ORDER — FUROSEMIDE 20 MG PO TABS: 20.0000 mg | ORAL_TABLET | Freq: Every day | ORAL | 0 refills | Status: AC | PRN

## 2021-10-20 MED ORDER — IPRATROPIUM-ALBUTEROL 0.5-2.5 (3) MG/3ML IN SOLN: 3.0000 mL | RESPIRATORY_TRACT | 0 refills | Status: AC | PRN

## 2021-10-20 NOTE — Plan of Care (Signed)
  Problem: Education: Goal: Knowledge of General Education information will improve Description: Including pain rating scale, medication(s)/side effects and non-pharmacologic comfort measures Outcome: Progressing   Problem: Health Behavior/Discharge Planning: Goal: Ability to manage health-related needs will improve Outcome: Progressing   Problem: Clinical Measurements: Goal: Ability to maintain clinical measurements within normal limits will improve Outcome: Progressing Goal: Will remain free from infection Outcome: Progressing Goal: Diagnostic test results will improve Outcome: Progressing Goal: Respiratory complications will improve Outcome: Progressing Goal: Cardiovascular complication will be avoided Outcome: Progressing   Problem: Activity: Goal: Risk for activity intolerance will decrease Outcome: Progressing   Problem: Nutrition: Goal: Adequate nutrition will be maintained Outcome: Progressing   Problem: Safety: Goal: Ability to remain free from injury will improve Outcome: Progressing   

## 2021-10-20 NOTE — TOC Transition Note (Addendum)
Transition of Care [facility]) - CM/SW Discharge Note   Patient Details  Name: Gregory [name] MRN: [id number] Date of Birth: 19-Sep-1920  Transition of Care [facility]) CM/SW Contact:  Verdell Carmine, RN Phone Number: 10/20/2021, 9:43 AM   Clinical Narrative:    Discussed with provider. Spoke to sone at bedside regarding discharge needs and plan for DC this afternoon. Patient will have caregiver this afternoon.  Feeding pump  ordered for home from [employer]. Spoke to [employer] who will be providing pump.  When ready will call for transport via [facility] per hospice.  1200 Patient is with [employer] and they will handle ordering the feeding pump. Corrdinated with [facility] and RN for education related to feeding based on supplies.  1400 the feeding pump is there and delivered to home. Son going home to receive patient .patient will go by [facility]. 1630 tube feeding tubing arrived on unit. [facility] called for transport    Final next level of care: Other (comment) (Home with round the clock caretakers.) Barriers to Discharge: No Barriers Identified   Patient Goals and CMS Choice        Discharge Placement               Home with round the clock caregivers        Discharge Plan and Services                DME Arranged: Tube feeding pump   Date DME Agency Contacted: 10/20/21 Time DME Agency Contacted: [phone] Representative spoke with at DME Agency: [place] Determinants of Health ([place]) Interventions     Readmission Risk Interventions    08/16/2021   11:52 AM 03/10/2021    3:21 PM  Readmission Risk Prevention Plan  Transportation Screening Complete Complete  PCP or Specialist Appt within 5-7 Days  Complete  Home Care Screening  Complete  Medication Review (RN CM)  Complete  Medication Review (RN Care Manager) Complete   HRI or [place] Complete   SW Recovery Care/Counseling Consult Complete   Palliative Care Screening Complete   [place]  Not Applicable

## 2021-10-20 NOTE — Progress Notes (Signed)
Gregory [name] was transferred from bed to stretcher by this nurse and EMS attendants with no issues. Daughter at bedside. Discharge instructions gone over with daughter and son. Either had questions upon discharge.

## 2021-10-20 NOTE — Discharge Instructions (Signed)
G tube feeding Pump, 30 ml a hour for tube feeding, with 300 ml water/distilled water flushes every 8 hours.

## 2021-10-20 NOTE — Discharge Summary (Addendum)
PATIENT DETAILS Name: Gregory [name] Age: 88 y.o. Sex: male Date of Birth: 01/20/1921 MRN: [id number]. Admitting Physician: Jonetta Osgood, MD KGY:JEHU, [name], DO  Admit Date: 10/14/2021 Discharge date: 10/20/2021  Recommendations for Outpatient Follow-up:  Follow up with PCP in 1-2 weeks Please obtain CMP/CBC in one week Repeat TSH in 4 to 6 weeks. Continue goals of care discussion with hospice team-very frail-essentially bedbound-poor long-term prognosis.  Would benefit from being transitioned to comfort measures if he were to have a significant decompensation event.  Admitted From:  Hospice care at home  Disposition: Hospice care at home   Discharge Condition: stable  CODE STATUS:   Code Status: Full Code   Diet recommendation:  Diet Order             Diet NPO time specified  Diet effective now                    Brief Summary: Patient is a 88 y.o.  male with history of dysphagia-s/p PEG tube placement, chronic hypoxic respiratory failure on 2-3 L of oxygen at all times, benign duodenal mass, chronic HFpEF, CKD stage IIIa, severe aortic stenosis s/p TAVR September 2020, chronic atrial fibrillation (not on anticoagulation due to bleeding risk), CAD being managed medically, PAD-who is followed at home by hospice (remains a full code)-brought to the hospital for generalized weakness-family thought his breathing was somewhat labored-upon further evaluation-he was found to have AKI and subsequently admitted to the hospitalist service.  See below for further details.   Significant events: 9/2>> fatigue/generalized weakness-found to have AKI-admit to [facility].   Significant studies: 9/2>> CXR: Atelectasis at left lung base, small bilateral pleural effusions. 9/2>> CT abdomen/pelvis: Well positioned gastrostomy tube-2.3 cm soft tissue projecting into the inferior wall of the third portion of the duodenum.  Possible mass in the upper pole of right kidney.   Moderate right/small left pleural effusion. 9/3>> CT head: No acute intracranial abnormalities.   Significant microbiology data: 9/2>> blood cultures: No growth 9/2>> COVID/influenza PCR: Negative   Procedures: None   Consults: None  Brief Hospital Course: AKI on CKD stage IIIa: Due to combination of diuretic/Bactrim use.  Improved with supportive care/IV fluid hydration.  Creatinine/BUN almost back to baseline.   Right sided pleural effusion: This is a chronic issue-likely a transudate in the setting of known HFpEF.  He has no symptoms from this effusion.  Do not think a thoracocentesis will provide any clinical benefit.  Risks currently outweigh any benefits.  After discussion with son/daughter at bedside on 9/3-plan thoracocentesis was canceled.   Acute metabolic encephalopathy: Likely due to AKI-improved-continues to have some intermittent delirium-especially at night.  Per family-has a history of delirium at night even at home as well.    Known duodenal mass: Per review of prior records in epic-this has been biopsied in [facility] is benign.  Review of CT scan done on 9/2-does not show any gastric distention (no history of vomiting as well)-and contrast seems to have passed to his small intestine.  No signs of gastric outlet obstruction.  Son reports that feeding via gravity is slow-I suspect that may be from age related gastroparesis.  Son/daughter aware that no plans for any endoscopic evaluation/surgical evaluation-since this is asymptomatic at this point-he would not be a candidate for surgical resection in any event.   Left forearm soft tissue density-likely olecranon bursa fluid collection: Asymptomatic-family noted this swelling on 9/5.  Appreciate orthopedics input-no intervention advised.   Chronic  HFpEF: Remains euvolemic-change diuretics to as needed-would only give if he is volume overloaded.  He is very frail-at risk for AKI.    Permanent atrial fibrillation:  Continue telemetry monitoring-review of outpatient cardiology note-indicates that patient is not a anticoagulation candidate due to bleeding risk.  On Plavix.     History of complete heart block-s/p PPM placement: Continue telemetry monitoring   Dysphagia: Has been n.p.o. since January 2023-dependent on PEG tube feedings.   Chronic hypoxic respiratory failure on 2-3 L of oxygen at baseline: Stable on 2-3 L of oxygen this morning.  Continues to lie flat-no signs of respiratory distress.     PAD: Continue Plavix   Hypokalemia: Repleted.   Hypernatremia: Improving-continue free water flushes and PEG tube feeding at home.  Was briefly on D5W here in the hospital-but since sodium has started to trend down with just free water through the PEG tube-presented will continue to improve over the next few days.   Hypothyroidism: Continue Synthroid-TSH minimally elevated-stable for PCP to repeat TSH in 4 to 6 weeks.   Macrocytic anemia: Chronic issue-likely secondary to CKD-suspect anemia slightly worse due to IVF dilution.  B12/folate level stable.   Chronic bilateral lower extremity pressure ulcers: Present prior to [facility] care consult.  No indication of infection at this point.   Renal cyst/mass upper pole of right kidney: Stable for outpatient follow-up.  Incidental finding.   Severe protein calorie malnutrition: On PEG tube feeds.   Severe failure to thrive syndrome: Bed bound-dependent on family for all activities of daily living.   Palliative care: Poor overall prognosis-very frail-elderly-bedbound-continue outpatient follow-up with hospice care.  Unfortunately he is a full code-DNR would be more than appropriate in this setting.  I have had a long discussion with the patient's son on the day of discharge-have recommended DNR-but per son-the patient has been insisting on full code.  I am not sure if patient understands his overall situation.  I will defer further goals of care discussion  to the outpatient setting by the hospice team that has been following him.   BMI: Estimated body mass index is 22.79 kg/m as calculated from the following:   Height as of this encounter: '5\' 8"'$  (1.727 m).   Weight as of this encounter: 68 kg.   Pressure Ulcer: Pressure Injury 03/03/21 Heel Left Deep Tissue Pressure Injury - Purple or maroon localized area of discolored intact skin or blood-filled blister due to damage of underlying soft tissue from pressure and/or shear. (Active)  03/03/21 1114  Location: Heel  Location Orientation: Left  Staging: Deep Tissue Pressure Injury - Purple or maroon localized area of discolored intact skin or blood-filled blister due to damage of underlying soft tissue from pressure and/or shear.  Wound Description (Comments):   Present on Admission: Yes  Dressing Type Foam - Lift dressing to assess site every shift 10/19/21 0707     Pressure Injury 03/10/21 Toe (Comment  which one) Anterior;Left;Distal Unstageable - Full thickness tissue loss in which the base of the injury is covered by slough (yellow, tan, gray, green or brown) and/or eschar (tan, brown or black) in the wound bed. (Active)  03/10/21 1730  Location: Toe (Comment  which one)  Location Orientation: Anterior;Left;Distal  Staging: Unstageable - Full thickness tissue loss in which the base of the injury is covered by slough (yellow, tan, gray, green or brown) and/or eschar (tan, brown or black) in the wound bed.  Wound Description (Comments): Black area at the tip of the left  great toe.  Present on Admission: Yes  Dressing Type None 10/19/21 0707     Pressure Injury 03/10/21 Toe (Comment  which one) Anterior;Right;Distal Unstageable - Full thickness tissue loss in which the base of the injury is covered by slough (yellow, tan, gray, green or brown) and/or eschar (tan, brown or black) in the wound bed (Active)  03/10/21 1730  Location: Toe (Comment  which one)  Location Orientation:  Anterior;Right;Distal  Staging: Unstageable - Full thickness tissue loss in which the base of the injury is covered by slough (yellow, tan, gray, green or brown) and/or eschar (tan, brown or black) in the wound bed.  Wound Description (Comments): Right great toe at the tip of the toe.  Present on Admission: Yes  Dressing Type Foam - Lift dressing to assess site every shift 10/19/21 0707     Pressure Injury 04/03/21 Heel Right Deep Tissue Pressure Injury - Purple or maroon localized area of discolored intact skin or blood-filled blister due to damage of underlying soft tissue from pressure and/or shear. (Active)  04/03/21 2000  Location: Heel  Location Orientation: Right  Staging: Deep Tissue Pressure Injury - Purple or maroon localized area of discolored intact skin or blood-filled blister due to damage of underlying soft tissue from pressure and/or shear.  Wound Description (Comments):   Present on Admission:   Dressing Type Foam - Lift dressing to assess site every shift 10/19/21 0707     Nutrition Status: Nutrition Problem: Inadequate oral intake Etiology: inability to eat Signs/Symptoms: NPO status Interventions: Tube feeding    Obesity: Estimated body mass index is 28.56 kg/m as calculated from the following:   Height as of this encounter: '5\' 8"'$  (1.727 m).   Weight as of this encounter: 85.2 kg.   RN pressure injury documentation: Pressure Injury 03/03/21 Heel Left Deep Tissue Pressure Injury - Purple or maroon localized area of discolored intact skin or blood-filled blister due to damage of underlying soft tissue from pressure and/or shear. (Active)  03/03/21 1114  Location: Heel  Location Orientation: Left  Staging: Deep Tissue Pressure Injury - Purple or maroon localized area of discolored intact skin or blood-filled blister due to damage of underlying soft tissue from pressure and/or shear.  Wound Description (Comments):   Present on Admission: Yes  Dressing Type Foam -  Lift dressing to assess site every shift 10/19/21 0707     Pressure Injury 03/10/21 Toe (Comment  which one) Anterior;Left;Distal Unstageable - Full thickness tissue loss in which the base of the injury is covered by slough (yellow, tan, gray, green or brown) and/or eschar (tan, brown or black) in the wound bed. (Active)  03/10/21 1730  Location: Toe (Comment  which one) (Left great toe at the tip.)  Location Orientation: Anterior;Left;Distal  Staging: Unstageable - Full thickness tissue loss in which the base of the injury is covered by slough (yellow, tan, gray, green or brown) and/or eschar (tan, brown or black) in the wound bed.  Wound Description (Comments): Black area at the tip of the left great toe.  Present on Admission: Yes  Dressing Type None 10/19/21 0707     Pressure Injury 03/10/21 Toe (Comment  which one) Anterior;Right;Distal Unstageable - Full thickness tissue loss in which the base of the injury is covered by slough (yellow, tan, gray, green or brown) and/or eschar (tan, brown or black) in the wound bed (Active)  03/10/21 1730  Location: Toe (Comment  which one) (Right great toe at the tip of the toe.)  Location Orientation: Anterior;Right;Distal  Staging: Unstageable - Full thickness tissue loss in which the base of the injury is covered by slough (yellow, tan, gray, green or brown) and/or eschar (tan, brown or black) in the wound bed.  Wound Description (Comments): Right great toe at the tip of the toe.  Present on Admission: Yes  Dressing Type Foam - Lift dressing to assess site every shift 10/19/21 0707     Pressure Injury 04/03/21 Heel Right Deep Tissue Pressure Injury - Purple or maroon localized area of discolored intact skin or blood-filled blister due to damage of underlying soft tissue from pressure and/or shear. (Active)  04/03/21 2000  Location: Heel  Location Orientation: Right  Staging: Deep Tissue Pressure Injury - Purple or maroon localized area of discolored  intact skin or blood-filled blister due to damage of underlying soft tissue from pressure and/or shear.  Wound Description (Comments):   Present on Admission:   Dressing Type Foam - Lift dressing to assess site every shift 10/19/21 0707     Discharge Diagnoses:  Principal Problem:   Acute kidney injury superimposed on chronic kidney disease ([place]) Active Problems:   Anemia   Hypertension   Protein calorie malnutrition ([place])   Dysphagia s/p G tube    Paroxysmal atrial fibrillation ([facility])   PVD (peripheral vascular disease) ([facility])   Hypothyroidism   Pressure ulcer   Complete heart block s/p PPM    AKI (acute kidney injury) ([place])   Hypothermia   Hypernatremia   Pleural effusion   Chronic respiratory failure with hypoxia ([place])   Renal cyst   Discharge Instructions:  Activity:  As tolerated with Full fall precautions use walker/cane & assistance as needed   Discharge Instructions     Change dressing (specify)   Complete by: As directed    Wound care daily   Discharge instructions   Complete by: As directed    Follow with Primary MD  Orpah Greek, DO in 1-2 weeks  Please get a complete blood count and chemistry panel checked by your Primary MD at your next visit, and again as instructed by your Primary MD.  Get Medicines reviewed and adjusted: Please take all your medications with you for your next visit with your Primary MD  Laboratory/radiological data: Please request your Primary MD to go over all hospital tests and procedure/radiological results at the follow up, please ask your Primary MD to get all Hospital records sent to his/her office.  In some cases, they will be blood work, cultures and biopsy results pending at the time of your discharge. Please request that your primary care M.D. follows up on these results.  Also Note the following: If you experience worsening of your admission symptoms, develop shortness of breath, life threatening emergency,  suicidal or homicidal thoughts you must seek medical attention immediately by calling 911 or calling your MD immediately  if symptoms less severe.  You must read complete instructions/literature along with all the possible adverse reactions/side effects for all the Medicines you take and that have been prescribed to you. Take any new Medicines after you have completely understood and accpet all the possible adverse reactions/side effects.   Do not drive when taking Pain medications or sleeping medications (Benzodaizepines)  Do not take more than prescribed Pain, Sleep and Anxiety Medications. It is not advisable to combine anxiety,sleep and pain medications without talking with your primary care practitioner  Special Instructions: If you have smoked or chewed Tobacco  in the last 2 yrs please  stop smoking, stop any regular Alcohol  and or any Recreational drug use.  Wear Seat belts while driving.  Please note: You were cared for by a hospitalist during your hospital stay. Once you are discharged, your primary care physician will handle any further medical issues. Please note that NO REFILLS for any discharge medications will be authorized once you are discharged, as it is imperative that you return to your primary care physician (or establish a relationship with a primary care physician if you do not have one) for your post hospital discharge needs so that they can reassess your need for medications and monitor your lab values.   Increase activity slowly   Complete by: As directed       Allergies as of 10/20/2021       Reactions   Cefepime Rash, Other (See Comments)   Severe myoclonus for cefepime- don't take again-    Amlodipine Besylate Other (See Comments)   edema   Penicillins Hives, Rash        Medication List     STOP taking these medications    clonazePAM 0.5 MG tablet Commonly known as: KLONOPIN   morphine CONCENTRATE 10 mg / 0.5 ml concentrated solution    senna-docusate 8.6-50 MG tablet Commonly known as: Senokot-S   sulfamethoxazole-trimethoprim 800-160 MG tablet Commonly known as: BACTRIM DS   temazepam 15 MG capsule Commonly known as: RESTORIL   traZODone 50 MG tablet Commonly known as: DESYREL       TAKE these medications    atorvastatin 40 MG tablet Commonly known as: LIPITOR Place 40 mg into feeding tube daily.   clopidogrel 75 MG tablet Commonly known as: PLAVIX Place 1 tablet (75 mg total) into feeding tube daily.   docusate sodium 100 MG capsule Commonly known as: COLACE Take 100 mg by mouth daily.   famotidine 40 MG/5ML suspension Commonly known as: PEPCID Place 2.5 mLs (20 mg total) into feeding tube at bedtime.   feeding supplement (NEPRO CARB STEADY) Liqd Place 315 mLs into feeding tube 3 (three) times daily. What changed: how much to take   ferrous sulfate 300 (60 Fe) MG/5ML syrup Place 300 mg into feeding tube daily.   free water Soln Place 300 mLs into feeding tube 4 (four) times daily. What changed:  how much to take when to take this additional instructions   furosemide 20 MG tablet Commonly known as: LASIX Take 1 tablet Per J Tube daily as needed for edema or fluid (Take as needed for shortness of breath, leg swelling or weight gain 3 lbs in 24-48 hrs to 5 lbs in 7 days.). What changed: Another medication with the same name was removed. Continue taking this medication, and follow the directions you see here.   haloperidol 5 MG tablet Commonly known as: HALDOL Take 5 mg by mouth daily as needed for agitation (anxiety).   ipratropium-albuterol 0.5-2.5 (3) MG/3ML Soln Commonly known as: DUONEB Take 3 mLs by nebulization every 4 (four) hours as needed.   levothyroxine 75 MCG tablet Commonly known as: SYNTHROID Place 1 tablet (75 mcg total) into feeding tube daily at 6 (six) AM.   polyethylene glycol 17 g packet Commonly known as: MIRALAX / GLYCOLAX Take 17 g by mouth daily.    PROBIOTIC PO Place 1 capsule into feeding tube daily.   traMADol 50 MG tablet Commonly known as: ULTRAM Take 50 mg by mouth every evening.  Durable Medical Equipment  (From admission, onward)           Start     Ordered   10/20/21 0941  For home use only DME Tube feeding  Once       Comments: Feeding at 30 ml/hour 300 ml water flushes every 8 hours ( they have feedings at home)   10/20/21 0941   10/20/21 0922  For home use only DME Tube feeding pump  Once       Question:  Length of Need  Answer:  Lifetime   10/20/21 2703              Discharge Care Instructions  (From admission, onward)           Start     Ordered   10/20/21 0000  Change dressing (specify)       Comments: Wound care daily   10/20/21 [place], [employer] [employer] Follow up.   Why: Tube feeding company Contact information: [address] [address] [place], [place], DO. Schedule an appointment as soon as possible for a visit in 1 week(s).   Specialty: Internal Medicine Contact information: [place] [address] [phone]         [place], Peter M, MD. Schedule an appointment as soon as possible for a visit in 1 month(s).   Specialty: Cardiology Contact information: [address] [place]  [address] [phone]                Allergies  Allergen Reactions   Cefepime Rash and Other (See Comments)    Severe myoclonus for cefepime- don't take again-    Amlodipine Besylate Other (See Comments)    edema   Penicillins Hives and Rash     Other Procedures/Studies: [place] LT UPPER EXTREM LTD SOFT TISSUE NON VASCULAR  Result Date: 10/17/2021 CLINICAL DATA:  Left arm mass felt by family this morning. Posterior to elbow. EXAM: ULTRASOUND LEFT UPPER EXTREMITY LIMITED TECHNIQUE: Ultrasound examination of the upper extremity soft tissues  was performed in the area of clinical concern. COMPARISON:  None Available. FINDINGS: In the area of interest of the patient's lump at the posterior elbow, there is moderate subcutaneous fat edema and swelling. There is also a complex collection with internal fluid and septations measuring up to approximately 2.7 x 3.6 x 1.2 cm. No significant color flow vascularity. IMPRESSION: In the area of interest of the posterior elbow there is a complex fluid collection. This is compatible with bursitis. Recommend clinical correlation whether this is in the region of the olecranon bursa and may represent "olecranon bursitis." Otherwise, the fluid is nonspecific and may represent a bland noninfected collection versus an abscess. Electronically Signed   By: Yvonne Kendall M.D.   On: 10/17/2021 15:25   CT HEAD WO CONTRAST (5MM)  Result Date: 10/15/2021 CLINICAL DATA:  Delirium.  Confusion. EXAM: CT HEAD WITHOUT CONTRAST TECHNIQUE: Contiguous axial images were obtained from the base of the skull through the vertex without intravenous contrast. RADIATION DOSE REDUCTION: This exam was performed according to the departmental dose-optimization program which includes automated exposure control, adjustment of the mA and/or kV according to patient size and/or use of iterative reconstruction technique. COMPARISON:  None Available. FINDINGS: Brain: No evidence of acute infarction, hemorrhage, hydrocephalus, extra-axial collection or mass lesion/mass  effect. Remote right cerebellar hemisphere infarct. There is mild diffuse low-attenuation within the subcortical and periventricular white matter compatible with chronic microvascular disease. Prominence of sulci and ventricles compatible with brain atrophy. Vascular: No hyperdense vessel or unexpected calcification. Skull: Normal. Negative for fracture or focal lesion. Sinuses/Orbits: Mild mucosal thickening involving the paranasal sinuses. No air-fluid levels. Mastoid air cells are clear.  Other: None. IMPRESSION: 1. No acute intracranial abnormalities. 2. Chronic microvascular disease and brain atrophy. 3. Remote right cerebellar hemisphere infarct. Electronically Signed   By: Kerby Moors M.D.   On: 10/15/2021 05:21   CT ABDOMEN PELVIS WO CONTRAST  Result Date: 10/14/2021 CLINICAL DATA:  Extraluminal contrast noted on current abdomen radiograph, which was obtained after injection of contrast into a gastrostomy tube to assess tube position. EXAM: CT ABDOMEN AND PELVIS WITHOUT CONTRAST TECHNIQUE: Multidetector CT imaging of the abdomen and pelvis was performed following the standard protocol without IV contrast. RADIATION DOSE REDUCTION: This exam was performed according to the departmental dose-optimization program which includes automated exposure control, adjustment of the mA and/or kV according to patient size and/or use of iterative reconstruction technique. COMPARISON:  Current abdomen radiograph. FINDINGS: Lower chest: Moderate right and small left pleural effusions. Associated dependent lung base opacities consistent with atelectasis. Hepatobiliary: Liver normal in size. No mass or focal lesion. Normal gallbladder. No bile duct dilation. Pancreas: Unremarkable. No pancreatic ductal dilatation or surrounding inflammatory changes. Spleen: Normal in size without focal abnormality. Adrenals/Urinary Tract: No adrenal masses. Kidneys are normal in position. Bilateral renal cortical thinning. Possible mass/cyst from the anterior upper pole the right kidney, proximally 1.7 cm in size, not fully defined. No other evidence of a renal mass. No stone. No hydronephrosis. Ureters are normal in course and in caliber. Bladder is distended, otherwise unremarkable. Stomach/Bowel: Percutaneous gastrostomy tube is well positioned within the anterior mid stomach. Stomach is mostly decompressed. There is no extraluminal contrast as was suggested on the current radiograph. No adjacent inflammation. There is a  small soft tissue mass that projects along the inferior wall of the 3rd portion of the duodenum. This measures 2.2 x 2.0 x 2.2 cm. Small bowel and colon are normal in caliber. No wall thickening. No inflammation. Vascular/Lymphatic: Aortic atherosclerosis. No aneurysm. No convincing enlarged lymph nodes. Reproductive: Prominent prostate, 4.6 x 3.8 cm transversely. Other: No ascites. Musculoskeletal: No fracture or acute finding. No bone lesion. Diffuse skeletal demineralization. IMPRESSION: 1. Well-positioned percutaneous gastrostomy tube. No extraluminal contrast. 2. Abnormal 2.3 cm soft tissue projecting along the inferior wall of the third portion of the duodenum. This could reflect a mass arising from the duodenum such as a leiomyoma. It could potentially reflect an enlarged retroperitoneal lymph node indenting the duodenum. This could be further assessed with endoscopy. 3. Possible mass from the upper pole the right kidney. A possible cystic lesion was noted in the upper right kidney on an ultrasound dated 03/02/2021. Consider follow-up renal ultrasound for further assessment. 4. Moderate right and small left pleural effusions with associated dependent atelectasis. 5. Aortic atherosclerosis. Electronically Signed   By: Lajean Manes M.D.   On: 10/14/2021 11:09   DG ABDOMEN PEG TUBE LOCATION  Result Date: 10/14/2021 CLINICAL DATA:  Peg tube not function. EXAM: ABDOMEN - 1 VIEW COMPARISON:  07/31/2021 FINDINGS: Gastrostomy tube overlies the left abdomen. Contrast material in the gastric lumen suggests recent contrast injection via the gastrostomy tube. Contrast material is also seen in the proximal duodenum. There is a collection of contrast inferior to the gastric antrum  that may be related to a distended antral region of the stomach, but extraluminal contrast pool at this location cannot be excluded. IMPRESSION: 1. Collection of contrast inferior to the gastric antrum may be related to distention of the  antral region of the stomach, but extraluminal contrast pool is not excluded. CT abdomen recommended to further evaluate. Electronically Signed   By: Misty Stanley M.D.   On: 10/14/2021 09:02   DG Chest Port 1 View  Result Date: 10/14/2021 CLINICAL DATA:  Shortness of breath, fever, weakness. EXAM: PORTABLE CHEST 1 VIEW COMPARISON:  08/14/2021 FINDINGS: Heart is enlarged and the mediastinal contour is stable. A TAVR stent is noted. There is atherosclerotic calcification of the aorta. A multi lead pacemaker device is present over the left chest. Pulmonary vasculature is mildly distended. There are small bilateral pleural effusions with atelectasis or infiltrate at the left lung base. No pneumothorax. IMPRESSION: 1. Cardiomegaly with mildly distended pulmonary vasculature. 2. Mild atelectasis, edema, or infiltrate at the left lung base. 3. Small bilateral pleural effusions. Electronically Signed   By: Brett Fairy M.D.   On: 10/14/2021 02:39   CUP PACEART REMOTE DEVICE CHECK  Result Date: 10/03/2021 Scheduled remote reviewed. Normal device function.  Next remote 91 days- JJB    TODAY-DAY OF DISCHARGE:  Subjective:   Lion Fernandez today has no headache,no chest abdominal pain,no new weakness tingling or numbness, feels much better wants to go home today.   Objective:   Blood pressure 112/64, pulse 81, temperature 97.8 F (36.6 C), temperature source Oral, resp. rate 20, height '5\' 8"'$  (1.727 m), weight 85.2 kg, SpO2 97 %.  Intake/Output Summary (Last 24 hours) at 10/20/2021 1400 Last data filed at 10/20/2021 0402 Gross per 24 hour  Intake 1020.47 ml  Output 800 ml  Net 220.47 ml   Filed Weights   10/14/21 0229 10/17/21 0500 10/20/21 0500  Weight: 60 kg 68 kg 85.2 kg    Exam: Awake Alert, Oriented *3, No new F.N deficits, Normal affect [place].AT,PERRAL Supple Neck,No JVD, No cervical lymphadenopathy appriciated.  Symmetrical Chest wall movement, Good air movement bilaterally, CTAB RRR,No  Gallops,Rubs or new Murmurs, No Parasternal Heave +ve B.Sounds, Abd Soft, Non tender, No organomegaly appriciated, No rebound -guarding or rigidity. No Cyanosis, Clubbing or edema, No new Rash or bruise   PERTINENT RADIOLOGIC STUDIES: No results found.   PERTINENT LAB RESULTS: CBC: Recent Labs    10/19/21 0551  WBC 10.9*  HGB 7.4*  HCT 25.0*  PLT 123*   CMET CMP     Component Value Date/Time   NA 150 (H) 10/20/2021 0757   NA 140 02/01/2020 1604   K 4.4 10/20/2021 0757   CL 113 (H) 10/20/2021 0757   CO2 27 10/20/2021 0757   GLUCOSE 113 (H) 10/20/2021 0757   BUN 88 (H) 10/20/2021 0757   BUN 27 02/01/2020 1604   CREATININE 1.54 (H) 10/20/2021 0757   CREATININE 1.26 (H) 02/21/2016 0941   CALCIUM 10.2 10/20/2021 0757   PROT 7.9 10/14/2021 0242   ALBUMIN 3.8 10/14/2021 0242   AST 36 10/14/2021 0242   ALT 25 10/14/2021 0242   ALKPHOS 88 10/14/2021 0242   BILITOT 0.8 10/14/2021 0242   GFRNONAA 40 (L) 10/20/2021 0757   GFRAA 61 02/01/2020 1604    GFR Estimated Creatinine Clearance: 27.8 mL/min (A) (by C-G formula based on SCr of 1.54 mg/dL (H)). No results for input(s): "LIPASE", "AMYLASE" in the last 72 hours. No results for input(s): "CKTOTAL", "CKMB", "CKMBINDEX", "TROPONINI" in  the last 72 hours. Invalid input(s): "POCBNP" No results for input(s): "DDIMER" in the last 72 hours. No results for input(s): "HGBA1C" in the last 72 hours. No results for input(s): "CHOL", "HDL", "LDLCALC", "TRIG", "CHOLHDL", "LDLDIRECT" in the last 72 hours. No results for input(s): "TSH", "T4TOTAL", "T3FREE", "THYROIDAB" in the last 72 hours.  Invalid input(s): "FREET3" No results for input(s): "VITAMINB12", "FOLATE", "FERRITIN", "TIBC", "IRON", "RETICCTPCT" in the last 72 hours. Coags: No results for input(s): "INR" in the last 72 hours.  Invalid input(s): "PT" Microbiology: Recent Results (from the past 240 hour(s))  Blood culture (routine x 2)     Status: None   Collection Time:  10/14/21  2:42 AM   Specimen: BLOOD RIGHT FOREARM  Result Value Ref Range Status   Specimen Description BLOOD RIGHT FOREARM  Final   Special Requests   Final    BOTTLES DRAWN AEROBIC AND ANAEROBIC Blood Culture adequate volume   Culture   Final    NO GROWTH 5 DAYS Performed at [facility], [address], [place], [place] [address]    Report Status 10/19/2021 FINAL  Final  Urine Culture     Status: Abnormal   Collection Time: 10/14/21  2:42 AM   Specimen: Urine, Clean Catch  Result Value Ref Range Status   Specimen Description URINE, CLEAN CATCH  Final   Special Requests   Final    NONE Performed at [facility], [place] [address] [place], [place] [address]    Culture MULTIPLE SPECIES PRESENT, SUGGEST RECOLLECTION (A)  Final   Report Status 10/15/2021 FINAL  Final  Resp Panel by RT-PCR (Flu A&B, Covid)     Status: None   Collection Time: 10/14/21  2:42 AM   Specimen: Nasal Swab  Result Value Ref Range Status   SARS Coronavirus 2 by RT PCR NEGATIVE NEGATIVE Final    Comment: (NOTE) SARS-CoV-2 target nucleic acids are NOT DETECTED.  The SARS-CoV-2 RNA is generally detectable in upper respiratory specimens during the acute phase of infection. The lowest concentration of SARS-CoV-2 viral copies this assay can detect is 138 copies/mL. A negative result does not preclude SARS-Cov-2 infection and should not be used as the sole basis for treatment or other patient management decisions. A negative result may occur with  improper specimen collection/handling, submission of specimen other than nasopharyngeal swab, presence of viral mutation(s) within the areas targeted by this assay, and inadequate number of viral copies(<138 copies/mL). A negative result must be combined with clinical observations, patient history, and epidemiological information. The expected result is Negative.  Fact Sheet for Patients:  EntrepreneurPulse.com.au  Fact Sheet for  Healthcare Providers:  IncredibleEmployment.be  This test is no t yet approved or cleared by the [place] FDA and  has been authorized for detection and/or diagnosis of SARS-CoV-2 by FDA under an Emergency Use Authorization (EUA). This EUA will remain  in effect (meaning this test can be used) for the duration of the COVID-19 declaration under Section 564(b)(1) of the Act, 21 U.S.C.section 360bbb-3(b)(1), unless the authorization is terminated  or revoked sooner.       Influenza A by PCR NEGATIVE NEGATIVE Final   Influenza B by PCR NEGATIVE NEGATIVE Final    Comment: (NOTE) The Xpert Xpress SARS-CoV-2/FLU/RSV plus assay is intended as an aid in the diagnosis of influenza from Nasopharyngeal swab specimens and should not be used as a sole basis for treatment. Nasal washings and aspirates are unacceptable for Xpert Xpress SARS-CoV-2/FLU/RSV testing.  Fact Sheet  for Patients: EntrepreneurPulse.com.au  Fact Sheet for Healthcare Providers: IncredibleEmployment.be  This test is not yet approved or cleared by the [place] FDA and has been authorized for detection and/or diagnosis of SARS-CoV-2 by FDA under an Emergency Use Authorization (EUA). This EUA will remain in effect (meaning this test can be used) for the duration of the COVID-19 declaration under Section 564(b)(1) of the Act, 21 U.S.C. section 360bbb-3(b)(1), unless the authorization is terminated or revoked.  Performed at [facility], [place] [address], [place], [place] [address]   Blood culture (routine x 2)     Status: None   Collection Time: 10/14/21  8:31 AM   Specimen: BLOOD LEFT ARM  Result Value Ref Range Status   Specimen Description BLOOD LEFT ARM  Final   Special Requests   Final    BOTTLES DRAWN AEROBIC AND ANAEROBIC Blood Culture adequate volume   Culture   Final    NO GROWTH 5 DAYS Performed at [facility], [place] [address],  [place], [place] [address]    Report Status 10/19/2021 FINAL  Final    FURTHER DISCHARGE INSTRUCTIONS:  Get Medicines reviewed and adjusted: Please take all your medications with you for your next visit with your Primary MD  Laboratory/radiological data: Please request your Primary MD to go over all hospital tests and procedure/radiological results at the follow up, please ask your Primary MD to get all Hospital records sent to his/her office.  In some cases, they will be blood work, cultures and biopsy results pending at the time of your discharge. Please request that your primary care M.D. goes through all the records of your hospital data and follows up on these results.  Also Note the following: If you experience worsening of your admission symptoms, develop shortness of breath, life threatening emergency, suicidal or homicidal thoughts you must seek medical attention immediately by calling 911 or calling your MD immediately  if symptoms less severe.  You must read complete instructions/literature along with all the possible adverse reactions/side effects for all the Medicines you take and that have been prescribed to you. Take any new Medicines after you have completely understood and accpet all the possible adverse reactions/side effects.   Do not drive when taking Pain medications or sleeping medications (Benzodaizepines)  Do not take more than prescribed Pain, Sleep and Anxiety Medications. It is not advisable to combine anxiety,sleep and pain medications without talking with your primary care practitioner  Special Instructions: If you have smoked or chewed Tobacco  in the last 2 yrs please stop smoking, stop any regular Alcohol  and or any Recreational drug use.  Wear Seat belts while driving.  Please note: You were cared for by a hospitalist during your hospital stay. Once you are discharged, your primary care physician will handle any further medical issues. Please note that NO  REFILLS for any discharge medications will be authorized once you are discharged, as it is imperative that you return to your primary care physician (or establish a relationship with a primary care physician if you do not have one) for your post hospital discharge needs so that they can reassess your need for medications and monitor your lab values.  Total Time spent coordinating discharge including counseling, education and face to face time equals greater than 30 minutes.  Signed: Marbella Markgraf 10/20/2021 2:00 PM

## 2021-10-20 NOTE — Progress Notes (Signed)
Educated [name] (son) and [name] (daughter) on how to hang PEG tube feedings and program kangaroo pump. Son was able to show and verbalize understanding of how to program kangaroo pump and administer feedings. Daughter was able to verbalize understanding of how to program and administer feedings.

## 2021-10-31 NOTE — Progress Notes (Signed)
Remote pacemaker transmission.   

## 2021-11-12 DEATH — deceased

## 2022-01-15 ENCOUNTER — Ambulatory Visit: Payer: Federal, State, Local not specified - PPO | Admitting: Cardiology

## 2023-01-21 IMAGING — CR DG CHEST 2V
2 series · 2 of 2 positions shown · non-contrast
Comparison: 04/01/2021

CLINICAL DATA: Shortness of breath/hypoxia.  Recent pneumonia.

EXAM:
CHEST - 2 VIEW

[chest lat]
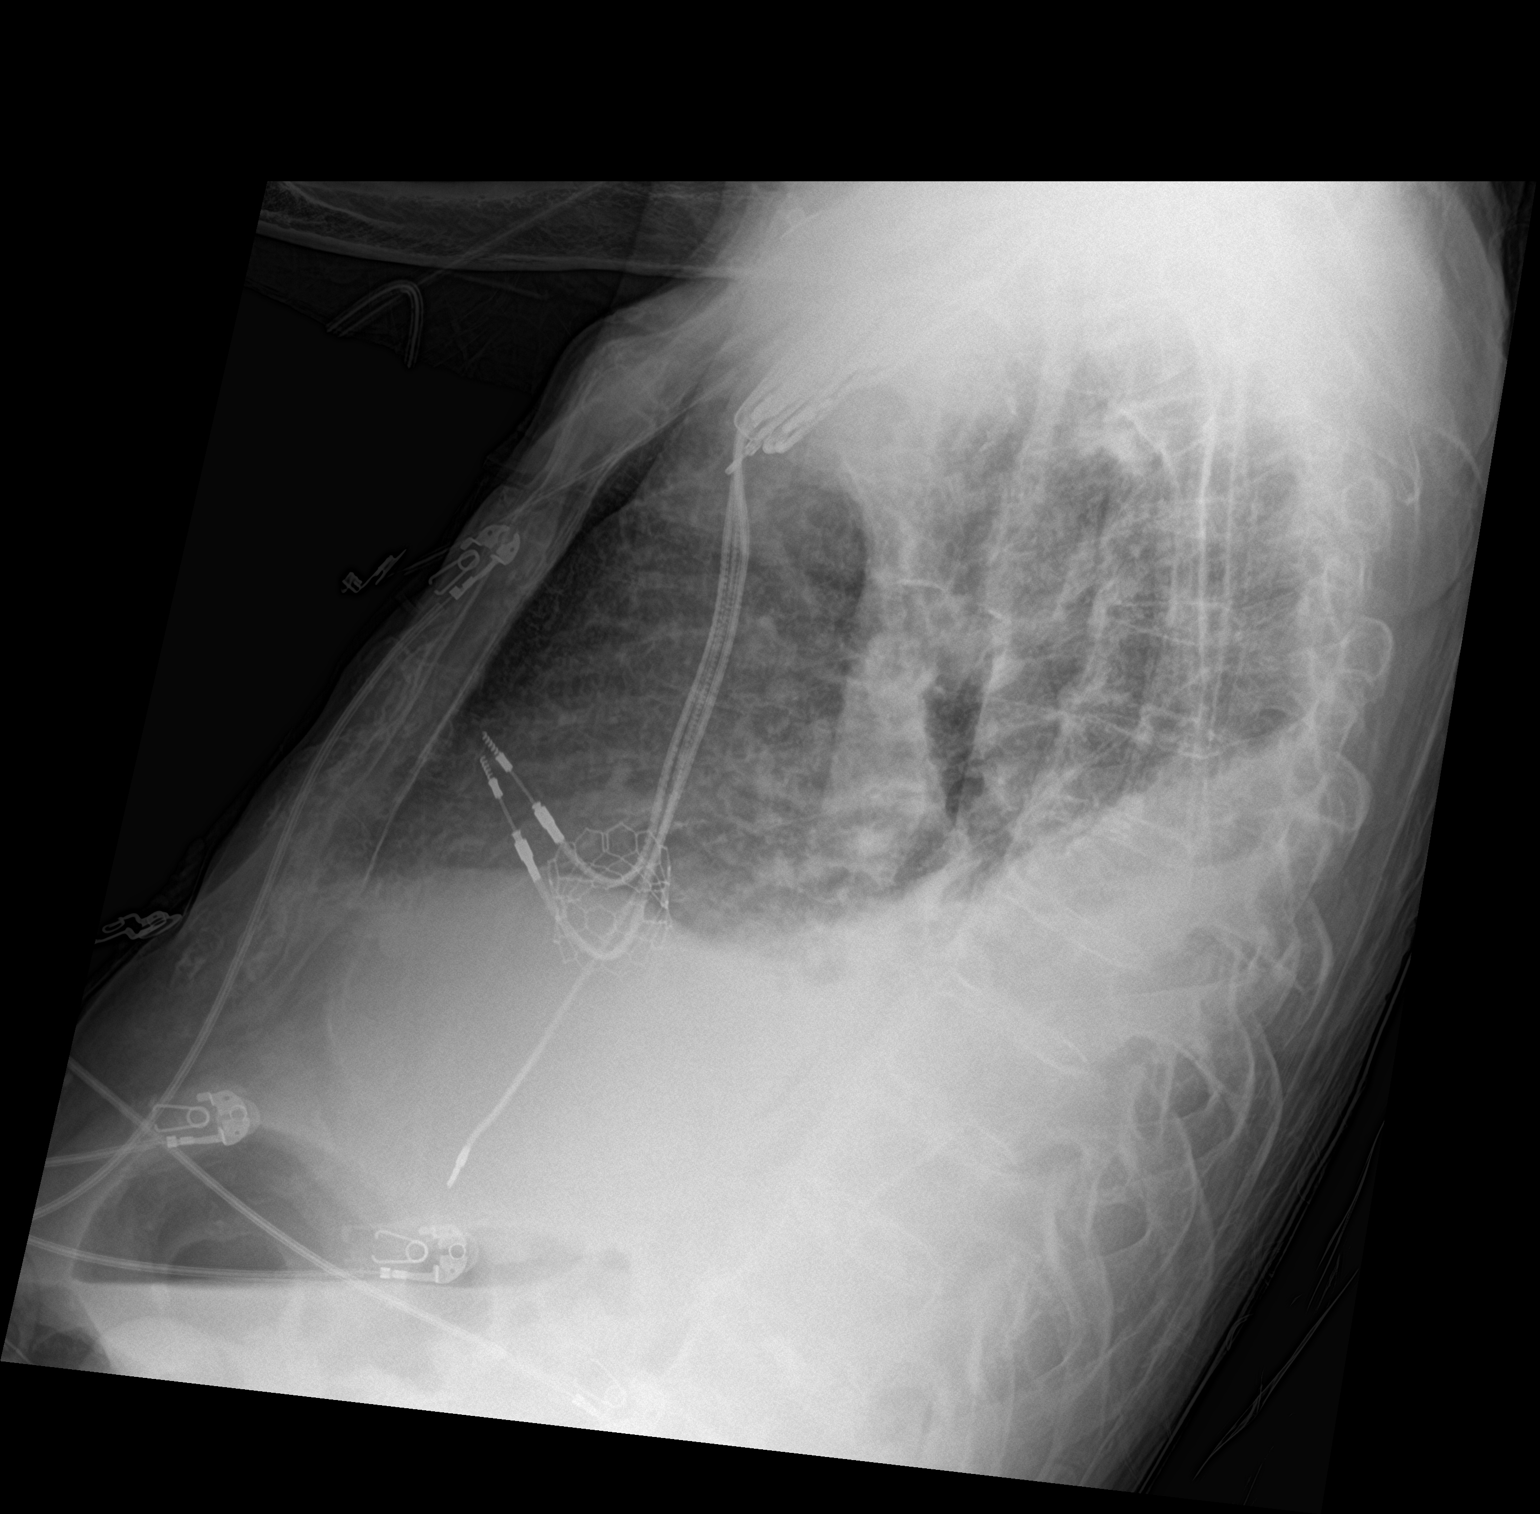

[chest ap]
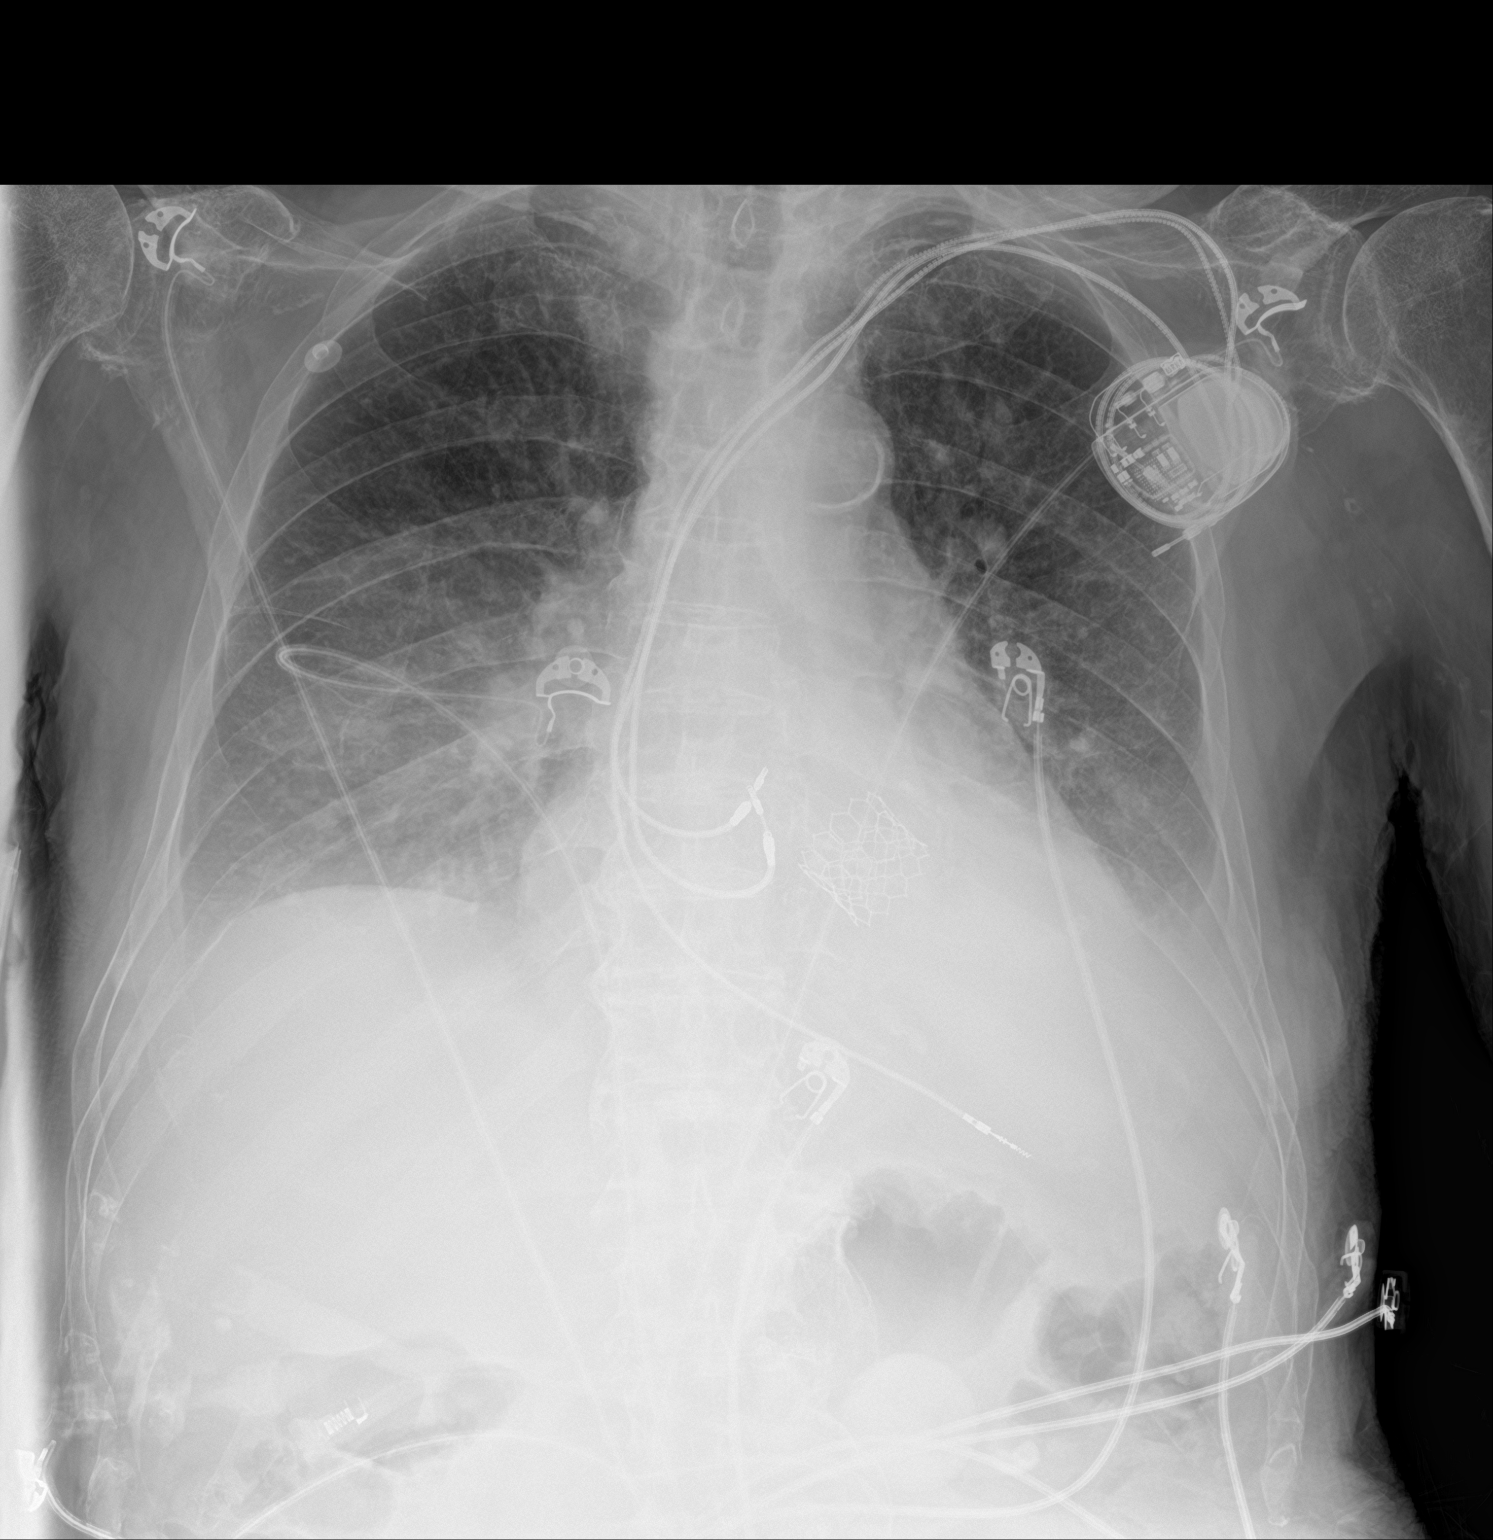

[2 of 2 positions shown; findings below may reference images not displayed]

FINDINGS: Left-sided pacemaker unchanged. Lungs are adequately inflated
demonstrate worsening hazy opacification over the left base with
obscuration of the hemidiaphragm and costophrenic angle. Findings
compatible with worsening moderate effusion with associated
atelectasis. Infection in the left base is possible. Hazy bilateral
perihilar opacification which may be due to edema or infection.
Cardiomediastinal silhouette and remainder of the exam is unchanged.
IMPRESSION: 1. Worsening moderate left pleural effusion likely with associated
atelectasis. Infection in the left base is possible.
2. Hazy bilateral perihilar opacification which may be due to edema
or infection.

## 2023-01-22 IMAGING — DX DG CHEST 1V PORT
1 series · 1 of 1 positions shown · non-contrast
Comparison: Yesterday

CLINICAL DATA: CHF

EXAM:
PORTABLE CHEST 1 VIEW

[chest]
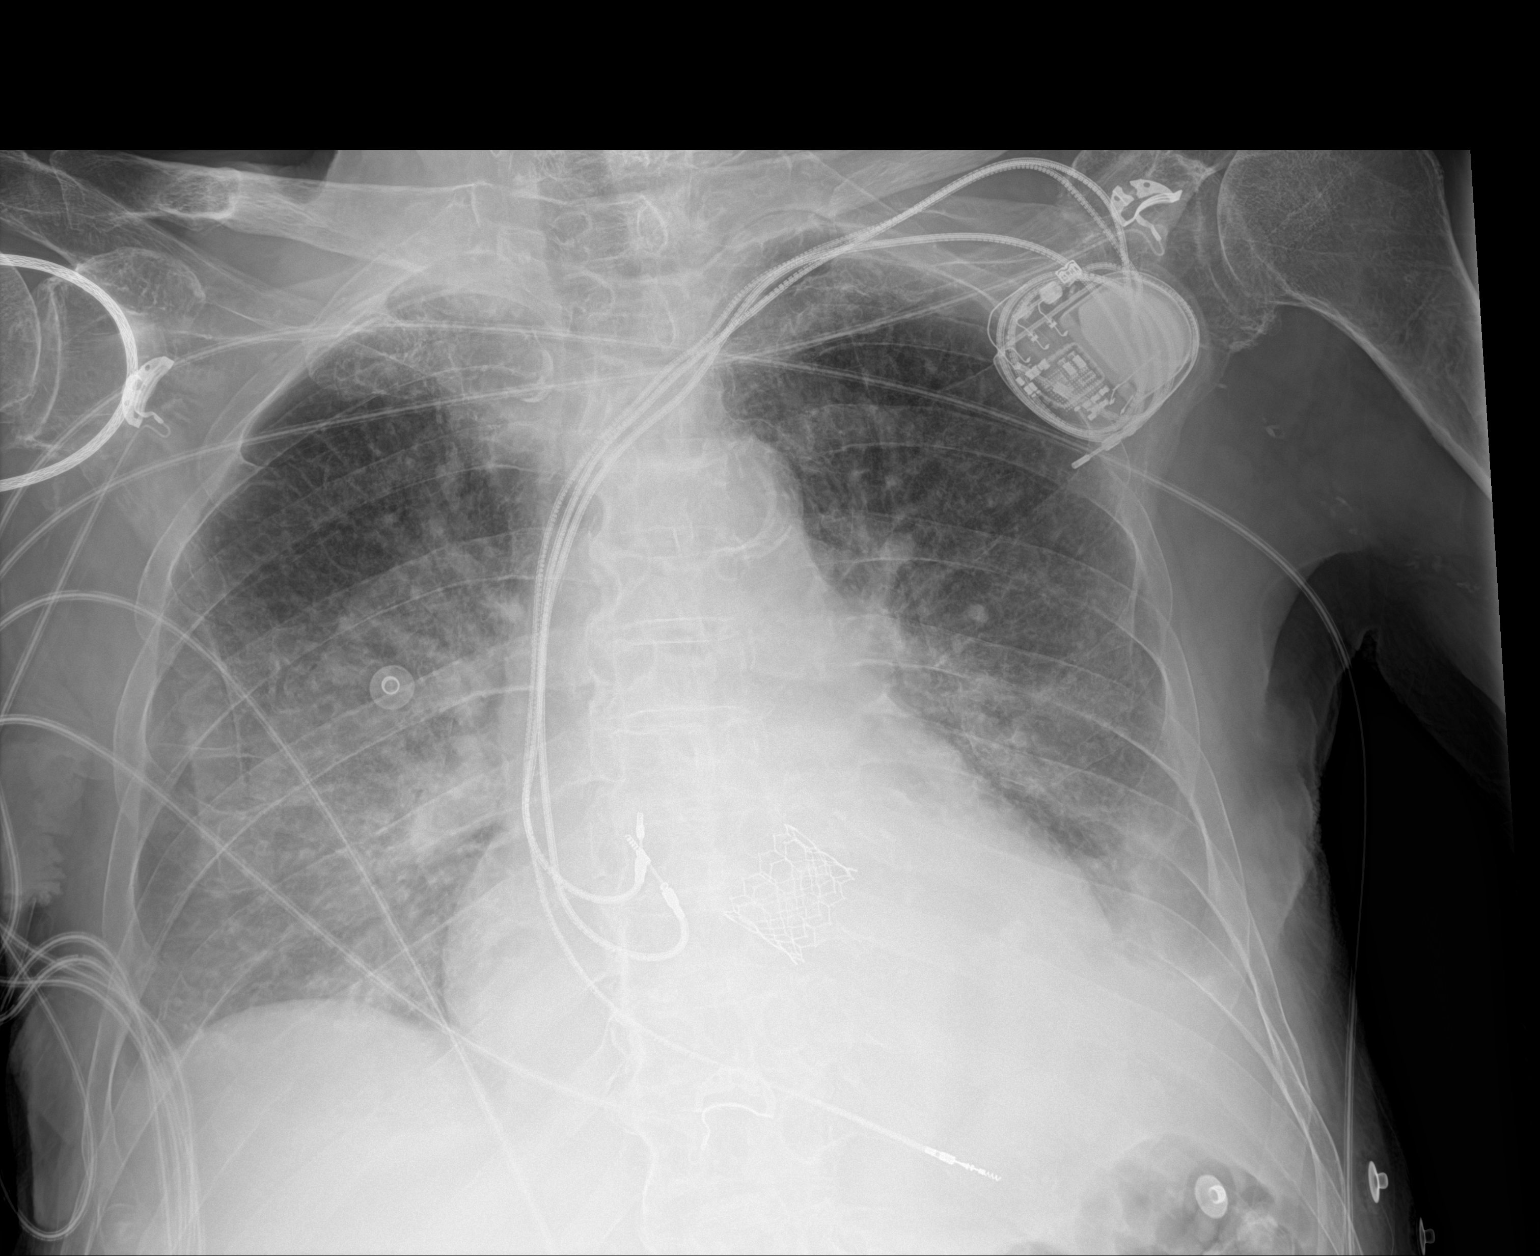

[1 of 1 positions shown; findings below may reference images not displayed]

FINDINGS: Dual-chamber pacer leads from the left with 2 right atrial leads.
Cardiomegaly. Transcatheter aortic valve replacement. Diffuse hazy
appearance of the chest with interstitial opacity. No visible
pneumothorax.
IMPRESSION: Symmetric opacification of the chest with pleural effusions,
favoring CHF over infection.

## 2023-01-23 IMAGING — DX DG CHEST 1V
1 series · 1 of 1 positions shown · non-contrast
Comparison: Prior chest x-ray 05/20/2021

CLINICAL DATA: Short of breath

EXAM:
CHEST  1 VIEW

[chest ap]
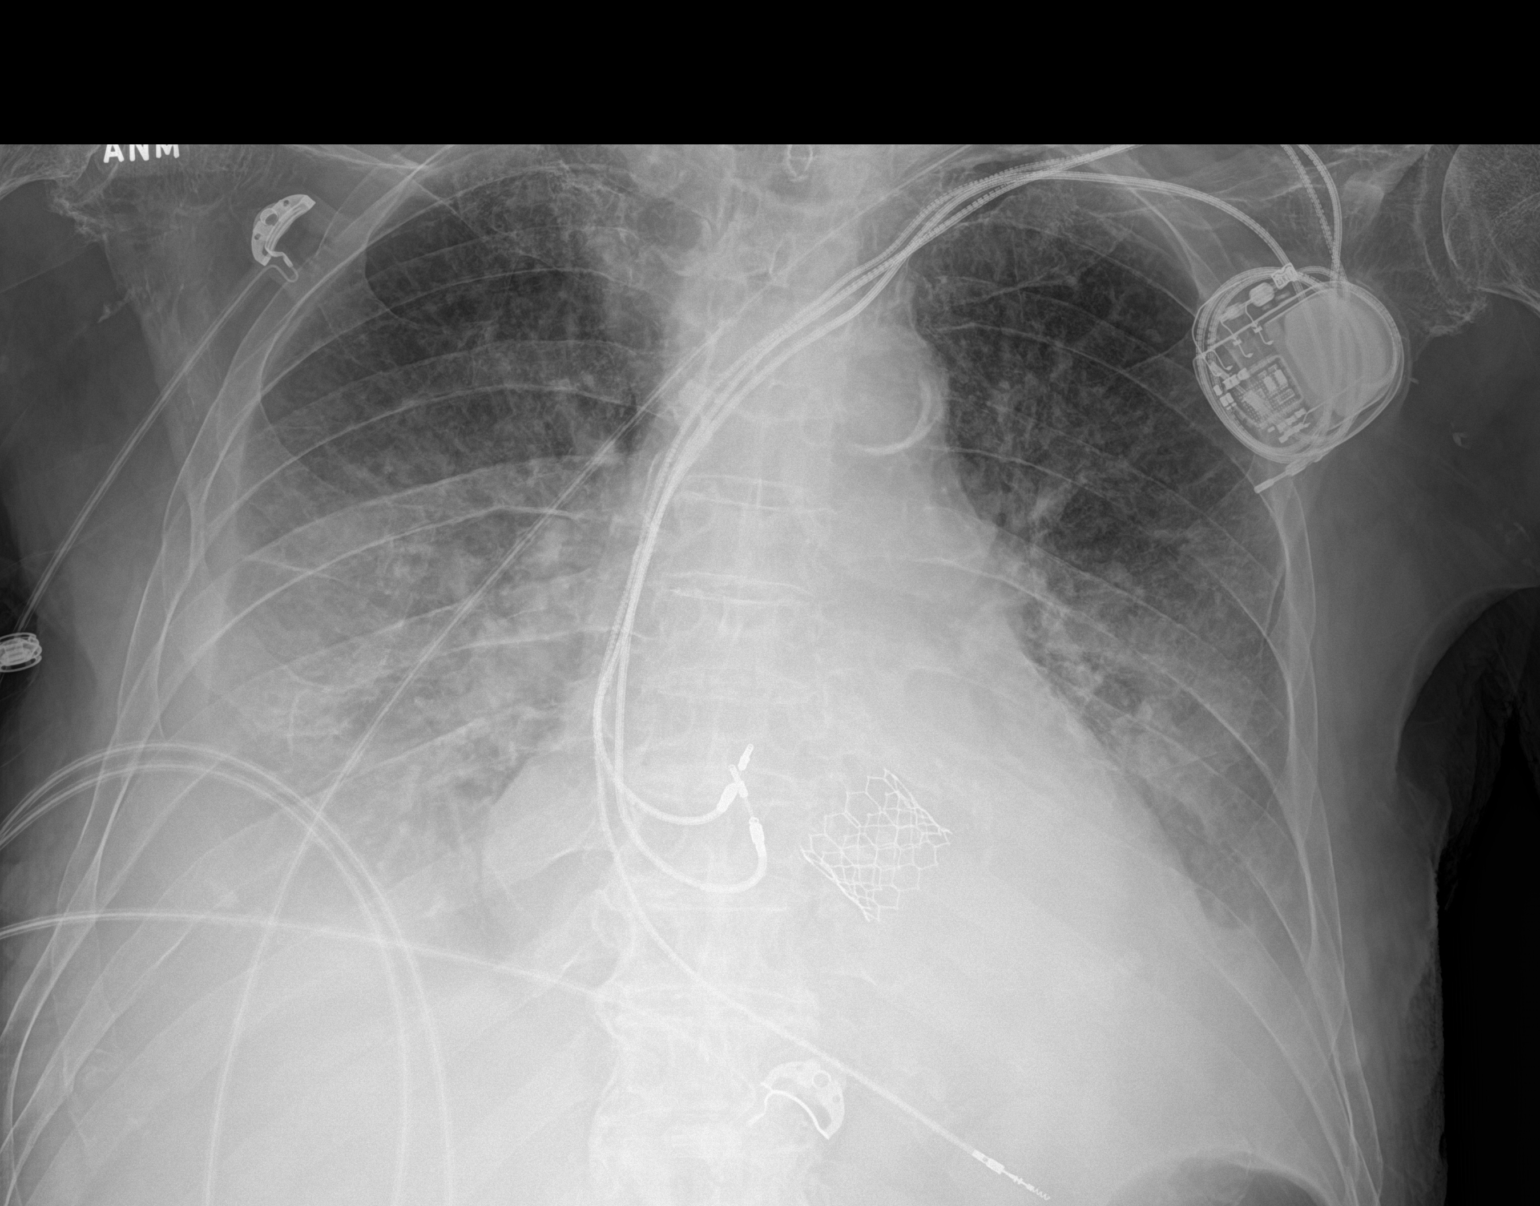

[1 of 1 positions shown; findings below may reference images not displayed]

FINDINGS: Left subclavian approach cardiac rhythm maintenance device in
unchanged position with 2 leads overlying the right atrium and
single lead overlying the right ventricle. Evidence of prior trans
aortic valve replacement. Stable cardiomegaly. Atherosclerotic
calcifications again noted in the transverse aorta. Enlarging right
layering pleural effusion with new right lower lobe atelectasis.
Smaller left pleural effusion appears similar. Pulmonary vascular
congestion with mild edema. No pneumothorax.
IMPRESSION: 1. Enlarging layering right pleural effusion.
2. Stable small left pleural effusion.
3. Stable cardiomegaly and mild pulmonary edema consistent with CHF.

## 2023-01-25 IMAGING — DX DG CHEST 1V PORT
1 series · 1 of 1 positions shown · non-contrast
Comparison: Chest x-ray dated May 21, 2021

CLINICAL DATA: History of hypertension

EXAM:
PORTABLE CHEST 1 VIEW

[chest ap]
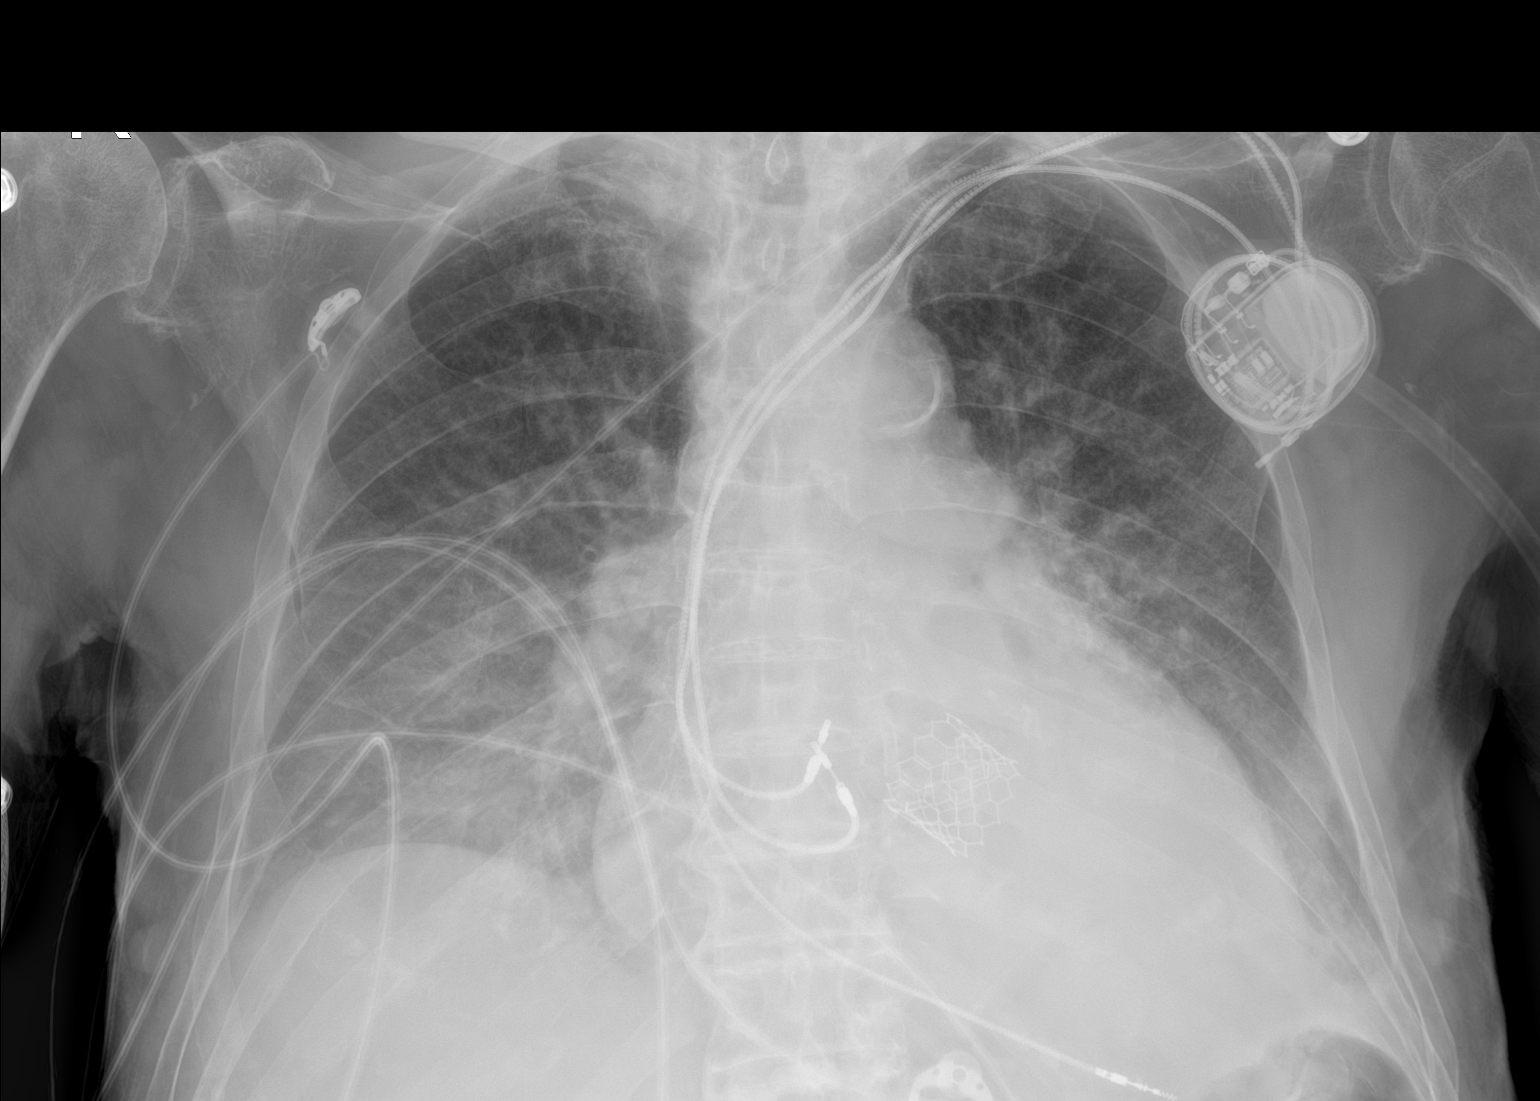

[1 of 1 positions shown; findings below may reference images not displayed]

FINDINGS: Unchanged enlarged cardiac contours. Prior transcatheter valve
replacement. Left chest wall pacer with unchanged lead position.
Lower lung predominant opacities, similar to prior exam and likely
due to a combination of atelectasis and layering pleural effusions.
No evidence of pneumothorax.
IMPRESSION: Lower lung opacities, likely a combination of atelectasis and
layering pleural effusions, unchanged compared to prior exam.

## 2023-01-26 IMAGING — DX DG CHEST 1V PORT
1 series · 1 of 1 positions shown · non-contrast
Comparison: 05/23/2021

CLINICAL DATA: Shortness of breath.

EXAM:
PORTABLE CHEST 1 VIEW

[chest ap]
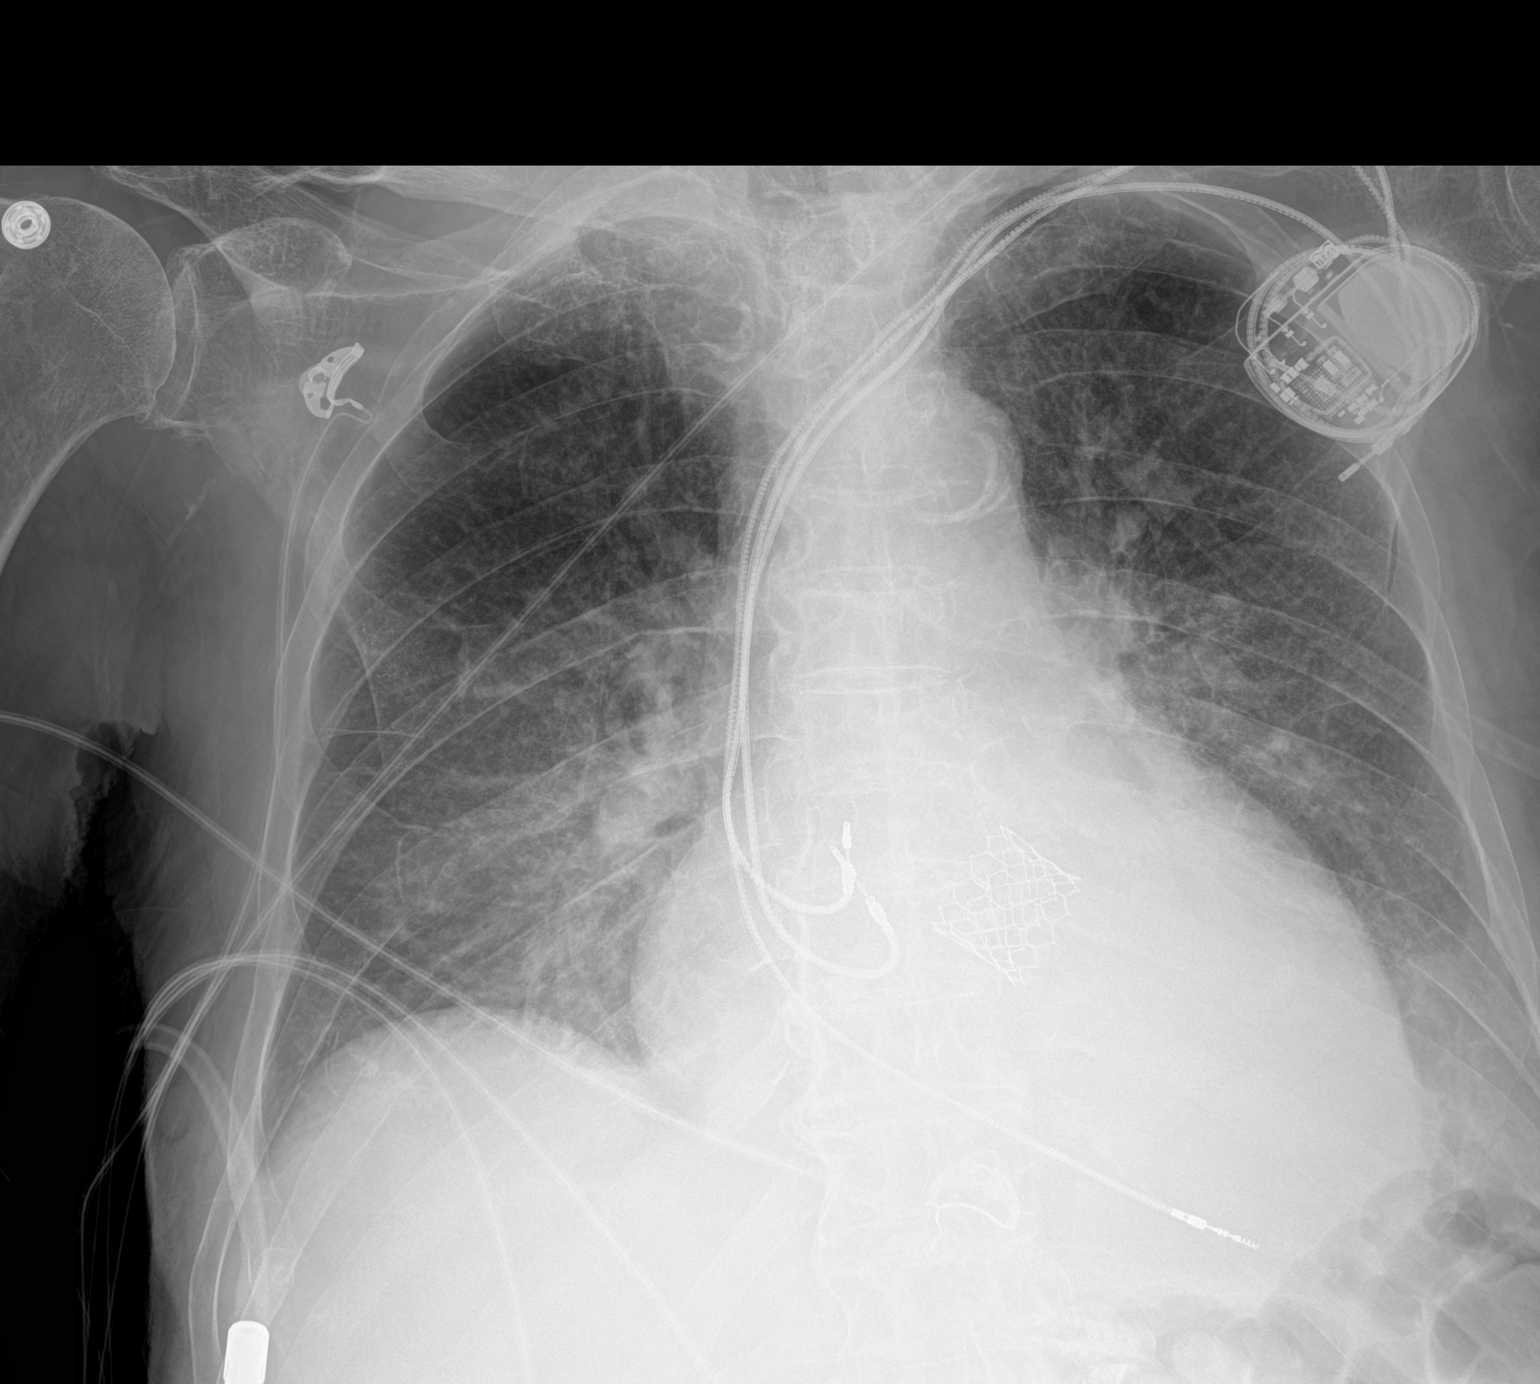

[1 of 1 positions shown; findings below may reference images not displayed]

FINDINGS: 0310 hours. The cardio pericardial silhouette is enlarged. Status
post TAVR hazy opacity overlies lung bases with more dense
consolidative retrocardiac left base collapse/consolidation.
Left-sided pacer again noted. Telemetry leads overlie the chest.
IMPRESSION: No substantial change. Probable basilar atelectasis, left greater
than right with layering small pleural effusions.
